# Patient Record
Sex: Female | Born: 1945 | Race: White | Hispanic: No | Smoking: Never smoker
Health system: Southern US, Community
[De-identification: ages and names within clinical notes are randomized; demographics above are authoritative.]

## PROBLEM LIST (undated history)

## (undated) DIAGNOSIS — Z9989 Dependence on other enabling machines and devices: Secondary | ICD-10-CM

## (undated) DIAGNOSIS — R112 Nausea with vomiting, unspecified: Secondary | ICD-10-CM

## (undated) DIAGNOSIS — M1990 Unspecified osteoarthritis, unspecified site: Secondary | ICD-10-CM

## (undated) DIAGNOSIS — F329 Major depressive disorder, single episode, unspecified: Secondary | ICD-10-CM

## (undated) DIAGNOSIS — F32A Depression, unspecified: Secondary | ICD-10-CM

## (undated) DIAGNOSIS — I10 Essential (primary) hypertension: Secondary | ICD-10-CM

## (undated) DIAGNOSIS — B029 Zoster without complications: Secondary | ICD-10-CM

## (undated) DIAGNOSIS — F419 Anxiety disorder, unspecified: Secondary | ICD-10-CM

## (undated) DIAGNOSIS — Z9221 Personal history of antineoplastic chemotherapy: Secondary | ICD-10-CM

## (undated) DIAGNOSIS — C801 Malignant (primary) neoplasm, unspecified: Secondary | ICD-10-CM

## (undated) DIAGNOSIS — Z923 Personal history of irradiation: Secondary | ICD-10-CM

## (undated) DIAGNOSIS — C50412 Malignant neoplasm of upper-outer quadrant of left female breast: Secondary | ICD-10-CM

## (undated) DIAGNOSIS — K219 Gastro-esophageal reflux disease without esophagitis: Secondary | ICD-10-CM

## (undated) DIAGNOSIS — J45909 Unspecified asthma, uncomplicated: Secondary | ICD-10-CM

## (undated) DIAGNOSIS — G4730 Sleep apnea, unspecified: Secondary | ICD-10-CM

## (undated) DIAGNOSIS — Z9889 Other specified postprocedural states: Secondary | ICD-10-CM

## (undated) DIAGNOSIS — C50919 Malignant neoplasm of unspecified site of unspecified female breast: Secondary | ICD-10-CM

## (undated) HISTORY — DX: Malignant neoplasm of upper-outer quadrant of left female breast: C50.412

## (undated) HISTORY — DX: Dependence on other enabling machines and devices: Z99.89

## (undated) HISTORY — DX: Zoster without complications: B02.9

## (undated) HISTORY — DX: Depression, unspecified: F32.A

## (undated) HISTORY — PX: DILATION AND CURETTAGE OF UTERUS: SHX78

## (undated) HISTORY — DX: Essential (primary) hypertension: I10

## (undated) HISTORY — PX: LAPAROTOMY: SHX154

## (undated) HISTORY — DX: Major depressive disorder, single episode, unspecified: F32.9

## (undated) HISTORY — DX: Malignant (primary) neoplasm, unspecified: C80.1

## (undated) HISTORY — PX: ANTERIOR AND POSTERIOR REPAIR: SHX1172

## (undated) HISTORY — PX: ABDOMINAL HYSTERECTOMY: SHX81

---

## 2002-06-01 DIAGNOSIS — Z923 Personal history of irradiation: Secondary | ICD-10-CM

## 2002-06-01 DIAGNOSIS — C50919 Malignant neoplasm of unspecified site of unspecified female breast: Secondary | ICD-10-CM

## 2002-06-01 DIAGNOSIS — C801 Malignant (primary) neoplasm, unspecified: Secondary | ICD-10-CM

## 2002-06-01 DIAGNOSIS — Z9221 Personal history of antineoplastic chemotherapy: Secondary | ICD-10-CM

## 2002-06-01 HISTORY — DX: Malignant (primary) neoplasm, unspecified: C80.1

## 2002-06-01 HISTORY — PX: BREAST LUMPECTOMY: SHX2

## 2002-06-01 HISTORY — PX: BREAST BIOPSY: SHX20

## 2002-06-01 HISTORY — DX: Personal history of antineoplastic chemotherapy: Z92.21

## 2002-06-01 HISTORY — DX: Personal history of irradiation: Z92.3

## 2002-06-01 HISTORY — DX: Malignant neoplasm of unspecified site of unspecified female breast: C50.919

## 2002-11-29 HISTORY — PX: BREAST EXCISIONAL BIOPSY: SUR124

## 2004-03-01 ENCOUNTER — Ambulatory Visit: Payer: Self-pay | Admitting: Oncology

## 2004-03-17 ENCOUNTER — Ambulatory Visit: Payer: Self-pay | Admitting: General Surgery

## 2004-06-26 ENCOUNTER — Ambulatory Visit: Payer: Self-pay | Admitting: Oncology

## 2004-07-10 ENCOUNTER — Ambulatory Visit: Payer: Self-pay | Admitting: Oncology

## 2004-09-04 ENCOUNTER — Ambulatory Visit: Payer: Self-pay | Admitting: Oncology

## 2006-02-17 ENCOUNTER — Ambulatory Visit: Payer: Self-pay | Admitting: Oncology

## 2006-03-01 ENCOUNTER — Ambulatory Visit: Payer: Self-pay | Admitting: Oncology

## 2006-04-01 ENCOUNTER — Ambulatory Visit: Payer: Self-pay | Admitting: Oncology

## 2006-09-30 ENCOUNTER — Ambulatory Visit: Payer: Self-pay | Admitting: Oncology

## 2006-10-20 ENCOUNTER — Ambulatory Visit: Payer: Self-pay | Admitting: Oncology

## 2006-10-31 ENCOUNTER — Ambulatory Visit: Payer: Self-pay | Admitting: Oncology

## 2006-11-30 ENCOUNTER — Ambulatory Visit: Payer: Self-pay | Admitting: Oncology

## 2007-04-02 ENCOUNTER — Ambulatory Visit: Payer: Self-pay | Admitting: Oncology

## 2007-04-18 ENCOUNTER — Ambulatory Visit: Payer: Self-pay | Admitting: Oncology

## 2007-05-02 ENCOUNTER — Ambulatory Visit: Payer: Self-pay | Admitting: Oncology

## 2007-10-31 ENCOUNTER — Ambulatory Visit: Payer: Self-pay | Admitting: Oncology

## 2007-11-09 ENCOUNTER — Ambulatory Visit: Payer: Self-pay | Admitting: Oncology

## 2008-04-01 ENCOUNTER — Ambulatory Visit: Payer: Self-pay | Admitting: Oncology

## 2008-04-04 ENCOUNTER — Ambulatory Visit: Payer: Self-pay | Admitting: Oncology

## 2008-05-01 ENCOUNTER — Ambulatory Visit: Payer: Self-pay | Admitting: Oncology

## 2008-11-28 ENCOUNTER — Emergency Department: Payer: Self-pay | Admitting: Emergency Medicine

## 2008-11-29 ENCOUNTER — Emergency Department: Payer: Self-pay | Admitting: Emergency Medicine

## 2009-04-01 LAB — HM MAMMOGRAPHY: HM Mammogram: NORMAL

## 2009-07-30 ENCOUNTER — Ambulatory Visit: Payer: Self-pay | Admitting: Oncology

## 2009-07-30 LAB — HM COLONOSCOPY: HM Colonoscopy: 5

## 2009-08-02 ENCOUNTER — Ambulatory Visit: Payer: Self-pay | Admitting: Oncology

## 2009-08-30 ENCOUNTER — Ambulatory Visit: Payer: Self-pay | Admitting: Oncology

## 2009-09-24 ENCOUNTER — Ambulatory Visit: Payer: Self-pay | Admitting: Unknown Physician Specialty

## 2009-11-18 ENCOUNTER — Ambulatory Visit: Payer: Self-pay | Admitting: Internal Medicine

## 2011-03-04 ENCOUNTER — Ambulatory Visit (INDEPENDENT_AMBULATORY_CARE_PROVIDER_SITE_OTHER): Payer: PRIVATE HEALTH INSURANCE | Admitting: Internal Medicine

## 2011-03-04 ENCOUNTER — Encounter: Payer: Self-pay | Admitting: Internal Medicine

## 2011-03-04 VITALS — BP 153/88 | HR 75 | Temp 98.6°F | Resp 16 | Ht 64.0 in | Wt 204.0 lb

## 2011-03-04 DIAGNOSIS — J329 Chronic sinusitis, unspecified: Secondary | ICD-10-CM

## 2011-03-04 DIAGNOSIS — F419 Anxiety disorder, unspecified: Secondary | ICD-10-CM | POA: Insufficient documentation

## 2011-03-04 DIAGNOSIS — F329 Major depressive disorder, single episode, unspecified: Secondary | ICD-10-CM | POA: Insufficient documentation

## 2011-03-04 DIAGNOSIS — F32A Depression, unspecified: Secondary | ICD-10-CM | POA: Insufficient documentation

## 2011-03-04 DIAGNOSIS — Z0000 Encounter for general adult medical examination without abnormal findings: Secondary | ICD-10-CM

## 2011-03-04 DIAGNOSIS — I10 Essential (primary) hypertension: Secondary | ICD-10-CM | POA: Insufficient documentation

## 2011-03-04 MED ORDER — AMOXICILLIN-POT CLAVULANATE 875-125 MG PO TABS: 1.0000 | ORAL_TABLET | Freq: Two times a day (BID) | ORAL | Status: AC

## 2011-03-04 MED ORDER — TRETINOIN 0.05 % EX CREA: 1.0000 "application " | TOPICAL_CREAM | Freq: Every day | CUTANEOUS | Status: DC

## 2011-03-04 NOTE — Patient Instructions (Signed)

## 2011-03-04 NOTE — Progress Notes (Signed)
Subjective:    Patient ID: Dana Obrien, female    DOB: 1946-01-03, 65 y.o.   MRN: [id number]  HPI Ms. Poch is a 65 year old female with a history of depression who presents for an acute visit complaining of left sinus pain and purulent nasal drainage. She reports that her symptoms started approximately one to 2 weeks ago. She has been trying over-the-counter medicines including Mucinex with no improvement. She has a history of recurrent sinus infections. She reports some subjective fever and chills at home. She reports intermittent cough productive of thick sputum. She has sick contacts with her work.  Ms. Ruddy also notes recent worsening of her depression and anxiety in the setting of a separation from her husband. She reports that her husband was lying to her about financial issues. She reports some anxiety about ongoing issues with the [employer]. She has not sought counseling for this.  Outpatient Encounter Prescriptions as of 03/04/2011  Medication Sig Dispense Refill  . tretinoin (RETIN-A) 0.05 % cream Apply 1 application topically at bedtime.  45 g  3  . albuterol (PROVENTIL HFA;VENTOLIN HFA) 108 (90 BASE) MCG/ACT inhaler Inhale 2 puffs into the lungs every 6 (six) hours as needed.        . ALPRAZolam (XANAX) 0.5 MG tablet Take 0.5 mg by mouth 3 (three) times daily as needed.        Marland Kitchen amoxicillin-clavulanate (AUGMENTIN) 875-125 MG per tablet Take 1 tablet by mouth 2 (two) times daily.  20 tablet  0  . FLUoxetine (PROZAC) 20 MG capsule Take 20 mg by mouth daily. Take 3 tablets daily       . fluticasone (FLONASE) 50 MCG/ACT nasal spray Place 2 sprays into the nose daily.        . hydrOXYzine (ATARAX/VISTARIL) 25 MG tablet Take 25 mg by mouth 2 (two) times daily.        Marland Kitchen losartan-hydrochlorothiazide (HYZAAR) 100-25 MG per tablet Take 1 tablet by mouth daily.        . meloxicam (MOBIC) 7.5 MG tablet Take 7.5 mg by mouth daily.          Review of Systems  Constitutional: Negative for  fever, chills, appetite change, fatigue and unexpected weight change.  HENT: Positive for congestion, sore throat, rhinorrhea, postnasal drip and sinus pressure. Negative for ear pain, trouble swallowing, neck pain and voice change.   Eyes: Negative for visual disturbance.  Respiratory: Positive for cough. Negative for shortness of breath, wheezing and stridor.   Cardiovascular: Negative for chest pain, palpitations and leg swelling.  Gastrointestinal: Negative for nausea, vomiting, abdominal pain, diarrhea, constipation, blood in stool, abdominal distention and anal bleeding.  Genitourinary: Negative for dysuria and flank pain.  Musculoskeletal: Negative for myalgias, arthralgias and gait problem.  Skin: Negative for color change and rash.  Neurological: Negative for dizziness and headaches.  Hematological: Negative for adenopathy. Does not bruise/bleed easily.  Psychiatric/Behavioral: Positive for dysphoric mood. Negative for suicidal ideas and sleep disturbance. The patient is nervous/anxious.    BP 153/88  Pulse 75  Temp(Src) 98.6 F (37 C) (Oral)  Resp 16  Ht 5\' 4"  (1.626 m)  Wt 204 lb (92.534 kg)  BMI 35.02 kg/m2  SpO2 97%     Objective:   Physical Exam  Constitutional: She is oriented to person, place, and time. She appears well-developed and well-nourished. No distress.  HENT:  Head: Normocephalic and atraumatic.    Right Ear: External ear and ear canal normal. A middle ear effusion is present.  Left Ear: External ear and ear canal normal. A middle ear effusion is present.  Nose: Right sinus exhibits maxillary sinus tenderness. Right sinus exhibits no frontal sinus tenderness. Left sinus exhibits maxillary sinus tenderness. Left sinus exhibits no frontal sinus tenderness.  Mouth/Throat: Oropharynx is clear and moist. No oropharyngeal exudate.  Eyes: Conjunctivae are normal. Pupils are equal, round, and reactive to light. Right eye exhibits no discharge. Left eye exhibits no  discharge. No scleral icterus.  Neck: Normal range of motion. Neck supple. No tracheal deviation present. No thyromegaly present.  Cardiovascular: Normal rate, regular rhythm, normal heart sounds and intact distal pulses.  Exam reveals no gallop and no friction rub.   No murmur heard. Pulmonary/Chest: Effort normal and breath sounds normal. No respiratory distress. She has no wheezes. She has no rales. She exhibits no tenderness.  Musculoskeletal: Normal range of motion. She exhibits no edema and no tenderness.  Lymphadenopathy:    She has no cervical adenopathy.  Neurological: She is alert and oriented to person, place, and time. No cranial nerve deficit. She exhibits normal muscle tone. Coordination normal.  Skin: Skin is warm and dry. No rash noted. She is not diaphoretic. No erythema. No pallor.  Psychiatric: Her speech is normal and behavior is normal. Judgment and thought content normal. Cognition and memory are normal. She exhibits a depressed mood.          Assessment & Plan:  1. Sinusitis - Left maxillary. Will treat with augmentin x10 days. Will continue ibuprofen and mucinex. She will call or return to clinic if symptoms do not improve. In the past she has required prednisone, and we discussed a course of this if no improvement.  2. Depression - worsening after separation from spouse. Offered support today. Discussed counseling. She would like to hold off for now. She will continue her Prozac. We discussed adding additional medicine if symptoms persist. She will followup in one month.

## 2011-04-06 ENCOUNTER — Other Ambulatory Visit (INDEPENDENT_AMBULATORY_CARE_PROVIDER_SITE_OTHER): Payer: PRIVATE HEALTH INSURANCE | Admitting: *Deleted

## 2011-04-06 DIAGNOSIS — Z0000 Encounter for general adult medical examination without abnormal findings: Secondary | ICD-10-CM

## 2011-04-06 LAB — CBC WITH DIFFERENTIAL/PLATELET
Eosinophils Relative: 4.9 % (ref 0.0–5.0)
HCT: 35.8 % — ABNORMAL LOW (ref 36.0–46.0)
Hemoglobin: 12.3 g/dL (ref 12.0–15.0)
Lymphocytes Relative: 27.3 % (ref 12.0–46.0)
Lymphs Abs: 1.9 10*3/uL (ref 0.7–4.0)
Monocytes Relative: 7.6 % (ref 3.0–12.0)
Neutro Abs: 4.1 10*3/uL (ref 1.4–7.7)
Platelets: 235 10*3/uL (ref 150.0–400.0)
WBC: 6.9 10*3/uL (ref 4.5–10.5)

## 2011-04-06 LAB — COMPREHENSIVE METABOLIC PANEL
ALT: 21 U/L (ref 0–35)
Albumin: 3.9 g/dL (ref 3.5–5.2)
CO2: 26 mEq/L (ref 19–32)
Calcium: 9.1 mg/dL (ref 8.4–10.5)
Chloride: 103 mEq/L (ref 96–112)
GFR: 80.31 mL/min (ref >60.00)
Glucose, Bld: 96 mg/dL (ref 70–99)
Sodium: 139 mEq/L (ref 135–145)
Total Bilirubin: 0.4 mg/dL (ref 0.3–1.2)
Total Protein: 7.1 g/dL (ref 6.0–8.3)

## 2011-04-06 LAB — LIPID PANEL
Cholesterol: 283 mg/dL — ABNORMAL HIGH (ref 0–200)
VLDL: 32.8 mg/dL (ref 0.0–40.0)

## 2011-04-06 LAB — LDL CHOLESTEROL, DIRECT: Direct LDL: 212.7 mg/dL

## 2011-04-07 ENCOUNTER — Telehealth: Payer: Self-pay | Admitting: *Deleted

## 2011-04-07 MED ORDER — ATORVASTATIN CALCIUM 20 MG PO TABS: 20.0000 mg | ORAL_TABLET | Freq: Every day | ORAL | Status: DC

## 2011-04-07 MED ORDER — ALPRAZOLAM 0.5 MG PO TABS: 0.5000 mg | ORAL_TABLET | Freq: Three times a day (TID) | ORAL | Status: DC | PRN

## 2011-04-07 NOTE — Telephone Encounter (Signed)
OK to refill #90

## 2011-04-07 NOTE — Telephone Encounter (Signed)
Called in.

## 2011-04-07 NOTE — Telephone Encounter (Signed)
Pt informed of results & RX. She is req Rf of Xanax. She has OV this week but will be out of med before apt.

## 2011-04-07 NOTE — Telephone Encounter (Signed)
Message copied by Vernie Murders on Tue Apr 07, 2011 11:24 AM ------      Message from: Ronna Polio A      Created: Mon Apr 06, 2011  5:17 PM       Labs show that cholesterol is elevated. I would like to start Lipitor 20mg  daily.  Recheck lipids and LFTs in 3 months.

## 2011-04-09 ENCOUNTER — Encounter: Payer: Self-pay | Admitting: Internal Medicine

## 2011-04-09 ENCOUNTER — Other Ambulatory Visit (HOSPITAL_COMMUNITY)
Admission: RE | Admit: 2011-04-09 | Discharge: 2011-04-09 | Disposition: A | Discharge status: Home / Self Care | Payer: PRIVATE HEALTH INSURANCE | Source: Ambulatory Visit | Attending: Internal Medicine | Admitting: Internal Medicine

## 2011-04-09 ENCOUNTER — Ambulatory Visit (INDEPENDENT_AMBULATORY_CARE_PROVIDER_SITE_OTHER): Payer: PRIVATE HEALTH INSURANCE | Admitting: Internal Medicine

## 2011-04-09 DIAGNOSIS — N816 Rectocele: Secondary | ICD-10-CM

## 2011-04-09 DIAGNOSIS — E785 Hyperlipidemia, unspecified: Secondary | ICD-10-CM

## 2011-04-09 DIAGNOSIS — F32A Depression, unspecified: Secondary | ICD-10-CM

## 2011-04-09 DIAGNOSIS — Z01419 Encounter for gynecological examination (general) (routine) without abnormal findings: Secondary | ICD-10-CM | POA: Insufficient documentation

## 2011-04-09 DIAGNOSIS — Z0000 Encounter for general adult medical examination without abnormal findings: Secondary | ICD-10-CM

## 2011-04-09 DIAGNOSIS — F329 Major depressive disorder, single episode, unspecified: Secondary | ICD-10-CM

## 2011-04-09 DIAGNOSIS — Z1159 Encounter for screening for other viral diseases: Secondary | ICD-10-CM | POA: Insufficient documentation

## 2011-04-09 MED ORDER — BUPROPION HCL ER (XL) 150 MG PO TB24: 150.0000 mg | ORAL_TABLET | ORAL | Status: DC

## 2011-04-09 NOTE — Patient Instructions (Signed)
Labs in 1 month. Follow up in 1 month. We will set up referral to urogynecology. Call if any concerns.

## 2011-04-09 NOTE — Progress Notes (Signed)
Subjective:    Patient ID: Dana Obrien, female    DOB: 02-08-1946, 65 y.o.   MRN: [id number]  HPI  65 year old female with a history of hypertension presents for her annual exam. She reports that recently her symptoms of depression have worsened. She recently separated from her husband of over 40 years. She is moving into a new apartment. She reports that this has been a difficult time for her. She does not feel that the Prozac is adequately controlling her symptoms. She is interested in adding another medication. She notes that she has a strong support network with her family. She feels that she is moving in the right direction. She denies any suicidal ideation.  We reviewed her lab work from last week. Notably, her cholesterol was elevated with an LDL greater than 200. We discussed the risk of heart disease and atherosclerosis at this level. We discussed starting Lipitor. She is willing to start this medication. We also discussed healthy diet and exercise including limiting intake of saturated fat and increasing intake of fiber as well as moderate exercise 30-60 minutes most days of the week. She had previously been participating in a walking program but had recently stopped with increased work responsibilities and with the separation from her husband.  She notes a history of cystocele and rectocele. She is interested in referral to urogynecology for this. She denies any problems with her bowel or bladder at present.  Outpatient Encounter Prescriptions as of 04/09/2011  Medication Sig Dispense Refill  . albuterol (PROVENTIL HFA;VENTOLIN HFA) 108 (90 BASE) MCG/ACT inhaler Inhale 2 puffs into the lungs every 6 (six) hours as needed.        . ALPRAZolam (XANAX) 0.5 MG tablet Take 1 tablet (0.5 mg total) by mouth 3 (three) times daily as needed.  90 tablet  0  . FLUoxetine (PROZAC) 20 MG capsule Take 60 mg by mouth daily.       . fluticasone (FLONASE) 50 MCG/ACT nasal spray Place 2 sprays into the  nose daily.        . hydrOXYzine (ATARAX/VISTARIL) 25 MG tablet Take 25 mg by mouth 2 (two) times daily as needed.       Marland Kitchen losartan-hydrochlorothiazide (HYZAAR) 100-25 MG per tablet Take 1 tablet by mouth daily.        . meloxicam (MOBIC) 7.5 MG tablet Take 7.5 mg by mouth daily.        Marland Kitchen tretinoin (RETIN-A) 0.05 % cream Apply 1 application topically at bedtime.  45 g  3  . atorvastatin (LIPITOR) 20 MG tablet Take 1 tablet (20 mg total) by mouth daily.  90 tablet  1     Review of Systems  Constitutional: Negative for fever, chills, appetite change, fatigue and unexpected weight change.  HENT: Negative for ear pain, congestion, sore throat, trouble swallowing, neck pain, voice change and sinus pressure.   Eyes: Negative for visual disturbance.  Respiratory: Negative for cough, shortness of breath, wheezing and stridor.   Cardiovascular: Negative for chest pain, palpitations and leg swelling.  Gastrointestinal: Negative for nausea, vomiting, abdominal pain, diarrhea, constipation, blood in stool, abdominal distention and anal bleeding.  Genitourinary: Negative for dysuria and flank pain.  Musculoskeletal: Negative for myalgias, arthralgias and gait problem.  Skin: Negative for color change and rash.  Neurological: Negative for dizziness and headaches.  Hematological: Negative for adenopathy. Does not bruise/bleed easily.  Psychiatric/Behavioral: Positive for dysphoric mood. Negative for suicidal ideas and sleep disturbance. The patient is not nervous/anxious.  BP 142/86  Pulse 72  Temp(Src) 98.2 F (36.8 C) (Oral)  Ht 5\' 4"  (1.626 m)  Wt 221 lb (100.245 kg)  BMI 37.93 kg/m2  SpO2 97%     Objective:   Physical Exam  Constitutional: She is oriented to person, place, and time. She appears well-developed and well-nourished. No distress.  HENT:  Head: Normocephalic and atraumatic.  Right Ear: External ear normal.  Left Ear: External ear normal.  Nose: Nose normal.  Mouth/Throat:  Oropharynx is clear and moist. No oropharyngeal exudate.  Eyes: Conjunctivae are normal. Pupils are equal, round, and reactive to light. Right eye exhibits no discharge. Left eye exhibits no discharge. No scleral icterus.  Neck: Normal range of motion. Neck supple. No tracheal deviation present. No thyromegaly present.  Cardiovascular: Normal rate, regular rhythm, normal heart sounds and intact distal pulses.  Exam reveals no gallop and no friction rub.   No murmur heard. Pulmonary/Chest: Effort normal and breath sounds normal. No respiratory distress. She has no wheezes. She has no rales. She exhibits no tenderness. Right breast exhibits no inverted nipple, no mass, no nipple discharge, no skin change and no tenderness. Left breast exhibits no inverted nipple, no mass, no nipple discharge, no skin change and no tenderness.    Abdominal: Soft. Bowel sounds are normal. She exhibits no distension and no mass. There is no tenderness. There is no rebound and no guarding.  Genitourinary: Rectum normal, vagina normal and uterus normal. No breast swelling, tenderness, discharge or bleeding. Pelvic exam was performed with patient prone. There is no rash, tenderness or lesion on the right labia. There is no rash, tenderness or lesion on the left labia. Uterus is not enlarged and not tender. Cervix exhibits no motion tenderness, no discharge and no friability. Right adnexum displays no mass, no tenderness and no fullness. Left adnexum displays no mass, no tenderness and no fullness. No erythema or tenderness around the vagina. No vaginal discharge found.       Cystocele noted  Musculoskeletal: Normal range of motion. She exhibits no edema and no tenderness.  Lymphadenopathy:    She has no cervical adenopathy.  Neurological: She is alert and oriented to person, place, and time. No cranial nerve deficit. She exhibits normal muscle tone. Coordination normal.  Skin: Skin is warm and dry. No rash noted. She is not  diaphoretic. No erythema. No pallor.  Psychiatric: She has a normal mood and affect. Her behavior is normal. Judgment and thought content normal.          Assessment & Plan:  1. General exam - Exam including breast exam and pelvic normal.  PAP is pending.  Health maintenance is up to date. Labs reviewed today.   2. Depression - Worsening. Will add bupropion to see if any improvement. Follow up one month.  3. Hyperlipidemia - Noted on screening labs. LDL>200.  Discussed goal of LDL<100. Will start Lipitor 20mg  daily. Repeat lipids and LFTs in 1 month. Encouraged diet low in saturated fat, high in fiber and regular exercise such as walking most days of week.  4. Rectocele/Cystocele - Will set up referral to Urogynecology.

## 2011-04-13 ENCOUNTER — Encounter: Payer: Self-pay | Admitting: Internal Medicine

## 2011-04-15 ENCOUNTER — Telehealth: Payer: Self-pay | Admitting: Internal Medicine

## 2011-04-15 NOTE — Telephone Encounter (Signed)
Notified patient of results 

## 2011-04-15 NOTE — Telephone Encounter (Signed)
Patient called asking about her PAP results.  Please advise

## 2011-04-15 NOTE — Telephone Encounter (Signed)
PAP was normal and HPV was negative. I think a letter was sent, but may not have arrived yet.

## 2011-05-04 ENCOUNTER — Other Ambulatory Visit (INDEPENDENT_AMBULATORY_CARE_PROVIDER_SITE_OTHER): Payer: PRIVATE HEALTH INSURANCE | Admitting: *Deleted

## 2011-05-04 DIAGNOSIS — Z0000 Encounter for general adult medical examination without abnormal findings: Secondary | ICD-10-CM

## 2011-05-04 DIAGNOSIS — E785 Hyperlipidemia, unspecified: Secondary | ICD-10-CM

## 2011-05-04 LAB — CBC WITH DIFFERENTIAL/PLATELET
Basophils Absolute: 0.1 10*3/uL (ref 0.0–0.1)
Basophils Relative: 0.6 % (ref 0.0–3.0)
Eosinophils Absolute: 0.3 10*3/uL (ref 0.0–0.7)
Eosinophils Relative: 3 % (ref 0.0–5.0)
HCT: 37.4 % (ref 36.0–46.0)
Hemoglobin: 12.8 g/dL (ref 12.0–15.0)
Lymphocytes Relative: 23.3 % (ref 12.0–46.0)
Lymphs Abs: 2 10*3/uL (ref 0.7–4.0)
MCHC: 34.2 g/dL (ref 30.0–36.0)
MCV: 95.4 fl (ref 78.0–100.0)
Monocytes Absolute: 0.7 10*3/uL (ref 0.1–1.0)
Monocytes Relative: 8.2 % (ref 3.0–12.0)
Neutro Abs: 5.6 10*3/uL (ref 1.4–7.7)
Neutrophils Relative %: 64.9 % (ref 43.0–77.0)
Platelets: 227 10*3/uL (ref 150.0–400.0)
RBC: 3.92 Mil/uL (ref 3.87–5.11)
RDW: 13.5 % (ref 11.5–14.6)
WBC: 8.7 10*3/uL (ref 4.5–10.5)

## 2011-05-04 LAB — COMPREHENSIVE METABOLIC PANEL
ALT: 20 U/L (ref 0–35)
AST: 21 U/L (ref 0–37)
Albumin: 4 g/dL (ref 3.5–5.2)
Alkaline Phosphatase: 75 U/L (ref 39–117)
BUN: 22 mg/dL (ref 6–23)
CO2: 27 mEq/L (ref 19–32)
Calcium: 9.2 mg/dL (ref 8.4–10.5)
Chloride: 102 mEq/L (ref 96–112)
Creatinine, Ser: 1.3 mg/dL — ABNORMAL HIGH (ref 0.4–1.2)
GFR: 42.36 mL/min — ABNORMAL LOW (ref >60.00)
Glucose, Bld: 96 mg/dL (ref 70–99)
Potassium: 3.8 mEq/L (ref 3.5–5.1)
Sodium: 139 mEq/L (ref 135–145)
Total Bilirubin: 0.6 mg/dL (ref 0.3–1.2)
Total Protein: 7.4 g/dL (ref 6.0–8.3)

## 2011-05-04 LAB — LIPID PANEL
Cholesterol: 181 mg/dL (ref 0–200)
HDL: 55.4 mg/dL (ref >39.00)
LDL Cholesterol: 94 mg/dL (ref 0–99)
Total CHOL/HDL Ratio: 3
Triglycerides: 156 mg/dL — ABNORMAL HIGH (ref 0.0–149.0)
VLDL: 31.2 mg/dL (ref 0.0–40.0)

## 2011-05-06 ENCOUNTER — Telehealth: Payer: Self-pay | Admitting: Internal Medicine

## 2011-05-06 ENCOUNTER — Ambulatory Visit: Payer: Self-pay | Admitting: Internal Medicine

## 2011-05-06 MED ORDER — FLUOXETINE HCL 20 MG PO CAPS: 60.0000 mg | ORAL_CAPSULE | Freq: Every day | ORAL | Status: DC

## 2011-05-06 NOTE — Telephone Encounter (Signed)
Please fax to [phone] attention Nettie Elm.

## 2011-05-06 NOTE — Telephone Encounter (Signed)
I have faxed order

## 2011-05-06 NOTE — Telephone Encounter (Signed)
Pt Bone Density showed some early bone loss in forearm.  We can discuss at her upcoming visit.

## 2011-05-07 NOTE — Telephone Encounter (Signed)
Patient informed. 

## 2011-05-07 NOTE — Telephone Encounter (Signed)
Pt not avail, left VM w/daughter to call office back

## 2011-05-08 ENCOUNTER — Other Ambulatory Visit: Payer: Self-pay | Admitting: *Deleted

## 2011-05-08 MED ORDER — FLUOXETINE HCL 20 MG PO CAPS: 60.0000 mg | ORAL_CAPSULE | Freq: Every day | ORAL | Status: DC

## 2011-05-11 ENCOUNTER — Ambulatory Visit (INDEPENDENT_AMBULATORY_CARE_PROVIDER_SITE_OTHER): Payer: PRIVATE HEALTH INSURANCE | Admitting: Internal Medicine

## 2011-05-11 ENCOUNTER — Encounter: Payer: Self-pay | Admitting: Internal Medicine

## 2011-05-11 VITALS — BP 130/80 | HR 76 | Temp 98.5°F | Wt 198.0 lb

## 2011-05-11 DIAGNOSIS — E785 Hyperlipidemia, unspecified: Secondary | ICD-10-CM | POA: Insufficient documentation

## 2011-05-11 DIAGNOSIS — N289 Disorder of kidney and ureter, unspecified: Secondary | ICD-10-CM

## 2011-05-11 DIAGNOSIS — F32A Depression, unspecified: Secondary | ICD-10-CM

## 2011-05-11 DIAGNOSIS — F329 Major depressive disorder, single episode, unspecified: Secondary | ICD-10-CM

## 2011-05-11 DIAGNOSIS — M899 Disorder of bone, unspecified: Secondary | ICD-10-CM

## 2011-05-11 DIAGNOSIS — M8580 Other specified disorders of bone density and structure, unspecified site: Secondary | ICD-10-CM | POA: Insufficient documentation

## 2011-05-11 DIAGNOSIS — R110 Nausea: Secondary | ICD-10-CM

## 2011-05-11 DIAGNOSIS — M949 Disorder of cartilage, unspecified: Secondary | ICD-10-CM

## 2011-05-11 LAB — BASIC METABOLIC PANEL
BUN: 17 mg/dL (ref 6–23)
CO2: 27 mEq/L (ref 19–32)
Chloride: 102 mEq/L (ref 96–112)
GFR: 66.21 mL/min (ref >60.00)
Glucose, Bld: 121 mg/dL — ABNORMAL HIGH (ref 70–99)
Potassium: 4 mEq/L (ref 3.5–5.1)

## 2011-05-11 MED ORDER — ONDANSETRON HCL 8 MG PO TABS: 8.0000 mg | ORAL_TABLET | Freq: Three times a day (TID) | ORAL | Status: AC | PRN

## 2011-05-11 NOTE — Progress Notes (Signed)
Subjective:    Patient ID: Dana Obrien, female    DOB: 1945/10/24, 65 y.o.   MRN: [id number]  HPI 65 year old female with history of hyperlipidemia, depression, and hypertension presents for followup. In regards to her depression, she reports significant improvement in her mood with the use of Wellbutrin. She has also noted an improvement in her energy level. She denies any side effects with Wellbutrin. She continues to take both Wellbutrin and Prozac.  In regards to her hyperlipidemia, she reports full compliance with Lipitor. She has been eating a healthier diet, low in saturated fat. She denies any noted myalgias with the use of Lipitor. We reviewed her recent lab work today which show a drop in her LDL of over 100 points.  She notes that over the last 3 days she has had some nausea. She reports that she has had numerous sick contacts with patients with viral gastroenteritis. She has not yet had any vomiting. She denies any fever. She did not have any abdominal pain. She denies any diarrhea. She has not taking any medication for this.  Today, we also reviewed the reports on her recent bone density test which showed osteopenia in her forearm with a T score of -1.2.  Outpatient Encounter Prescriptions as of 05/11/2011  Medication Sig Dispense Refill  . albuterol (PROVENTIL HFA;VENTOLIN HFA) 108 (90 BASE) MCG/ACT inhaler Inhale 2 puffs into the lungs every 6 (six) hours as needed.        . ALPRAZolam (XANAX) 0.5 MG tablet Take 1 tablet (0.5 mg total) by mouth 3 (three) times daily as needed.  90 tablet  0  . atorvastatin (LIPITOR) 20 MG tablet Take 1 tablet (20 mg total) by mouth daily.  90 tablet  1  . buPROPion (WELLBUTRIN XL) 150 MG 24 hr tablet Take 1 tablet (150 mg total) by mouth every morning.  30 tablet  2  . FLUoxetine (PROZAC) 20 MG capsule Take 3 capsules (60 mg total) by mouth daily.  90 capsule  6  . fluticasone (FLONASE) 50 MCG/ACT nasal spray Place 2 sprays into the nose  daily.        . hydrOXYzine (ATARAX/VISTARIL) 25 MG tablet Take 25 mg by mouth 2 (two) times daily as needed.       Marland Kitchen losartan-hydrochlorothiazide (HYZAAR) 100-25 MG per tablet Take 1 tablet by mouth daily.        . meloxicam (MOBIC) 7.5 MG tablet Take 7.5 mg by mouth daily.        Marland Kitchen tretinoin (RETIN-A) 0.05 % cream Apply 1 application topically at bedtime.  45 g  3  . ondansetron (ZOFRAN) 8 MG tablet Take 1 tablet (8 mg total) by mouth every 8 (eight) hours as needed for nausea.  20 tablet  0    Review of Systems  Constitutional: Negative for fever, chills, appetite change, fatigue and unexpected weight change.  HENT: Positive for voice change.   Eyes: Negative for visual disturbance.  Respiratory: Negative for cough, shortness of breath, wheezing and stridor.   Cardiovascular: Negative for chest pain, palpitations and leg swelling.  Gastrointestinal: Positive for nausea. Negative for vomiting, abdominal pain, diarrhea, constipation, blood in stool, abdominal distention and anal bleeding.  Genitourinary: Negative for dysuria and flank pain.  Musculoskeletal: Negative for myalgias, arthralgias and gait problem.  Skin: Negative for color change and rash.  Neurological: Negative for dizziness and headaches.  Hematological: Negative for adenopathy. Does not bruise/bleed easily.  Psychiatric/Behavioral: Negative for suicidal ideas, sleep disturbance and dysphoric mood.  The patient is not nervous/anxious.    BP 130/80  Pulse 76  Temp(Src) 98.5 F (36.9 C) (Oral)  Wt 198 lb (89.812 kg)  SpO2 95%     Objective:   Physical Exam  Constitutional: She is oriented to person, place, and time. She appears well-developed and well-nourished. No distress.  HENT:  Head: Normocephalic and atraumatic.  Right Ear: External ear normal.  Left Ear: External ear normal.  Nose: Nose normal.  Mouth/Throat: Oropharynx is clear and moist. No oropharyngeal exudate.  Eyes: Conjunctivae are normal. Pupils are  equal, round, and reactive to light. Right eye exhibits no discharge. Left eye exhibits no discharge. No scleral icterus.  Neck: Normal range of motion. Neck supple. No tracheal deviation present. No thyromegaly present.  Cardiovascular: Normal rate, regular rhythm, normal heart sounds and intact distal pulses.  Exam reveals no gallop and no friction rub.   No murmur heard. Pulmonary/Chest: Effort normal and breath sounds normal. No respiratory distress. She has no wheezes. She has no rales. She exhibits no tenderness.  Abdominal: Soft. Bowel sounds are normal. She exhibits no distension and no mass. There is no tenderness. There is no rebound and no guarding.  Musculoskeletal: Normal range of motion. She exhibits no edema and no tenderness.  Lymphadenopathy:    She has no cervical adenopathy.  Neurological: She is alert and oriented to person, place, and time. No cranial nerve deficit. She exhibits normal muscle tone. Coordination normal.  Skin: Skin is warm and dry. No rash noted. She is not diaphoretic. No erythema. No pallor.  Psychiatric: She has a normal mood and affect. Her behavior is normal. Judgment and thought content normal.          Assessment & Plan:  1. Depression - Improved with wellbutrin. Will continue. Follow up in 3 months.  2. Nausea - Suspect viral syndrome, given recent exposures. Will give Rx for ondansetron to use as needed. Follow up if symptoms persistent or if pt unable to tolerate liquids.  3. Hyperlipidemia - Amazing improvement on lipitor and with improved diet with reduction in LDL by over 100pts. Will continue lipitor. Follow up in 3 months. Repeat lipids in 6 months, 09/2011.  4. Osteopenia - Will check Vit D with labs. Pt will continue Vit D and calcium supplementation. Encouraged weight bearing exercise. Discussed option of bisphosphonate, but would prefer to hold off for now. Repeat bone density in 2 years.

## 2011-05-22 ENCOUNTER — Encounter: Payer: Self-pay | Admitting: Internal Medicine

## 2011-05-27 ENCOUNTER — Encounter: Payer: Self-pay | Admitting: Internal Medicine

## 2011-06-17 ENCOUNTER — Telehealth: Payer: Self-pay | Admitting: *Deleted

## 2011-06-17 ENCOUNTER — Other Ambulatory Visit: Payer: Self-pay | Admitting: *Deleted

## 2011-06-17 MED ORDER — FLUTICASONE PROPIONATE 50 MCG/ACT NA SUSP: 2.0000 | Freq: Every day | NASAL | Status: DC

## 2011-06-17 NOTE — Telephone Encounter (Signed)
Pharm faxed RF request for alprazolam 0.5 mg tid prn. OK?

## 2011-06-18 MED ORDER — ALPRAZOLAM 0.5 MG PO TABS: 0.5000 mg | ORAL_TABLET | Freq: Three times a day (TID) | ORAL | Status: DC | PRN

## 2011-06-18 NOTE — Telephone Encounter (Signed)
Called in.

## 2011-06-18 NOTE — Telephone Encounter (Signed)
ok 

## 2011-06-29 ENCOUNTER — Encounter: Payer: Self-pay | Admitting: Internal Medicine

## 2011-06-30 ENCOUNTER — Encounter: Payer: Self-pay | Admitting: Internal Medicine

## 2011-07-03 ENCOUNTER — Other Ambulatory Visit: Payer: Self-pay | Admitting: *Deleted

## 2011-07-03 MED ORDER — LOSARTAN POTASSIUM-HCTZ 100-25 MG PO TABS: 1.0000 | ORAL_TABLET | Freq: Every day | ORAL | Status: DC

## 2011-07-15 ENCOUNTER — Encounter: Payer: Self-pay | Admitting: Internal Medicine

## 2011-07-17 ENCOUNTER — Telehealth: Payer: Self-pay | Admitting: Internal Medicine

## 2011-07-17 MED ORDER — OSELTAMIVIR PHOSPHATE 75 MG PO CAPS: 75.0000 mg | ORAL_CAPSULE | Freq: Two times a day (BID) | ORAL | Status: AC

## 2011-07-17 NOTE — Telephone Encounter (Signed)
Tamiflu 75mg  po bid x 5 days.  Should be seen if symptoms not improving. Let her know that medication can cause some nausea, so should take with food.

## 2011-07-17 NOTE — Telephone Encounter (Signed)
Patient says that she has had chills, body aches, feels nauseated, has fever of 101. This all started this morning. 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 She is asking if she can get tamiflu called in and also a rx for phenergan. Also she says that last time you offered to increase her wellbutrin, but she didn't want to at that time, but she has decided she would like to increase her wellbutrin.

## 2011-07-17 NOTE — Telephone Encounter (Signed)
Pt aware. Rx sent to [facility] per pt's request.  Pt states she is already having nausea and would like some phenergan.  Pls advise.

## 2011-07-17 NOTE — Telephone Encounter (Signed)
Fine to also increase wellbutrin to 300mg  daily. Will need follow up in 1 month.

## 2011-07-17 NOTE — Telephone Encounter (Signed)
OK. Phenergan 12.5mg  po q8hr prn nausea, disp 30 tabs. No refill

## 2011-07-20 MED ORDER — PROMETHAZINE HCL 12.5 MG PO TABS: 12.5000 mg | ORAL_TABLET | Freq: Three times a day (TID) | ORAL | Status: DC | PRN

## 2011-07-20 NOTE — Telephone Encounter (Signed)
Rx sent to pharmacy   

## 2011-08-06 ENCOUNTER — Other Ambulatory Visit: Payer: Self-pay | Admitting: Internal Medicine

## 2011-08-10 ENCOUNTER — Ambulatory Visit: Payer: PRIVATE HEALTH INSURANCE | Admitting: Internal Medicine

## 2011-08-10 DIAGNOSIS — Z0289 Encounter for other administrative examinations: Secondary | ICD-10-CM

## 2011-08-11 ENCOUNTER — Telehealth: Payer: Self-pay | Admitting: *Deleted

## 2011-08-11 MED ORDER — PROMETHAZINE HCL 12.5 MG PO TABS: 12.5000 mg | ORAL_TABLET | Freq: Three times a day (TID) | ORAL | Status: DC | PRN

## 2011-08-11 MED ORDER — BUPROPION HCL ER (XL) 300 MG PO TB24: 300.0000 mg | ORAL_TABLET | Freq: Every day | ORAL | Status: DC

## 2011-08-11 NOTE — Telephone Encounter (Signed)
Pt c/o h/a, diarrhea, vomiting, nausea and temp (100.3). Symptoms started yesterday, today pt c/o some loose stools, h/a and nausea. Advised pt to continue clear liquids and bland diet, take imodium prn diarrhea/loose stools. Also sent in RX, ok per MD, for phenergan. She will call office w/no improvement in symptoms.   - Pt requested on 2/15 increase in welbutrin. OK per MD but does not look like it was updated and sent in. I sent new RX in today and advised pt to f/u in 1 mth.

## 2011-08-18 ENCOUNTER — Other Ambulatory Visit: Payer: Self-pay | Admitting: *Deleted

## 2011-08-18 MED ORDER — MELOXICAM 7.5 MG PO TABS: 7.5000 mg | ORAL_TABLET | Freq: Every day | ORAL | Status: DC

## 2011-09-07 ENCOUNTER — Encounter: Payer: Self-pay | Admitting: Internal Medicine

## 2011-09-07 ENCOUNTER — Ambulatory Visit (INDEPENDENT_AMBULATORY_CARE_PROVIDER_SITE_OTHER): Payer: PRIVATE HEALTH INSURANCE | Admitting: Internal Medicine

## 2011-09-07 VITALS — BP 118/79 | HR 79 | Temp 98.5°F | Ht 64.0 in | Wt 192.8 lb

## 2011-09-07 DIAGNOSIS — E785 Hyperlipidemia, unspecified: Secondary | ICD-10-CM

## 2011-09-07 DIAGNOSIS — F32A Depression, unspecified: Secondary | ICD-10-CM

## 2011-09-07 DIAGNOSIS — I10 Essential (primary) hypertension: Secondary | ICD-10-CM

## 2011-09-07 DIAGNOSIS — F329 Major depressive disorder, single episode, unspecified: Secondary | ICD-10-CM

## 2011-09-07 LAB — COMPREHENSIVE METABOLIC PANEL
AST: 22 U/L (ref 0–37)
Albumin: 4.2 g/dL (ref 3.5–5.2)
Alkaline Phosphatase: 72 U/L (ref 39–117)
BUN: 17 mg/dL (ref 6–23)
Creatinine, Ser: 0.8 mg/dL (ref 0.4–1.2)
Glucose, Bld: 87 mg/dL (ref 70–99)
Total Bilirubin: 0.4 mg/dL (ref 0.3–1.2)

## 2011-09-07 LAB — LDL CHOLESTEROL, DIRECT: Direct LDL: 125.6 mg/dL

## 2011-09-07 LAB — LIPID PANEL
Cholesterol: 205 mg/dL — ABNORMAL HIGH (ref 0–200)
HDL: 54.7 mg/dL (ref >39.00)
Total CHOL/HDL Ratio: 4
Triglycerides: 169 mg/dL — ABNORMAL HIGH (ref 0.0–149.0)

## 2011-09-07 NOTE — Assessment & Plan Note (Signed)
Symptoms persistent with ongoing marital issues. Will continue current medications. Encouraged pt to follow up with psychologist and offered to make referral to new psychologist. Follow up in 3 months and prn.

## 2011-09-07 NOTE — Assessment & Plan Note (Signed)
Blood pressure well-controlled. Will continue losartan hydrochlorothiazide. We'll check renal function with labs today. Followup 3 months.

## 2011-09-07 NOTE — Assessment & Plan Note (Signed)
Will recheck lipids and LFTs with labs today. 

## 2011-09-07 NOTE — Progress Notes (Signed)
Subjective:    Patient ID: Dana Obrien, female    DOB: February 22, 1946, 66 y.o.   MRN: [id number]  HPI 66 year old female with history of hyperlipidemia, hypertension, and depression presents for followup. In regards to her depression, she reports reasonable control of symptoms with the use of Prozac and Wellbutrin, however has some ongoing anxiety and depressed mood related to marital issues. She is in the process of separating from her husband and they're having significant financial strain. She has sought counseling for this in the past but is not currently following with a counselor. She reports good support from her family and friends.  In regards to her hyperlipidemia and hypertension, she reports full compliance with her medications. She denies any noted side effects such as myalgia. She denies any chest pain, palpitations, or shortness of breath.  Outpatient Encounter Prescriptions as of 09/07/2011  Medication Sig Dispense Refill  . albuterol (PROVENTIL HFA;VENTOLIN HFA) 108 (90 BASE) MCG/ACT inhaler Inhale 2 puffs into the lungs every 6 (six) hours as needed.        . ALPRAZolam (XANAX) 0.5 MG tablet Take 1 tablet (0.5 mg total) by mouth 3 (three) times daily as needed.  90 tablet  1  . atorvastatin (LIPITOR) 20 MG tablet Take 1 tablet (20 mg total) by mouth daily.  90 tablet  1  . buPROPion (WELLBUTRIN XL) 300 MG 24 hr tablet Take 1 tablet (300 mg total) by mouth daily.  90 tablet  0  . FLUoxetine (PROZAC) 20 MG capsule Take 3 capsules (60 mg total) by mouth daily.  90 capsule  6  . fluticasone (FLONASE) 50 MCG/ACT nasal spray Place 2 sprays into the nose daily.  16 g  3  . hydrOXYzine (ATARAX/VISTARIL) 25 MG tablet Take 25 mg by mouth 2 (two) times daily as needed.       Marland Kitchen losartan-hydrochlorothiazide (HYZAAR) 100-25 MG per tablet Take 1 tablet by mouth daily.  90 tablet  1  . meloxicam (MOBIC) 7.5 MG tablet Take 1 tablet (7.5 mg total) by mouth daily.  90 tablet  1  . Multiple Vitamin  (MULTIVITAMIN) capsule Take 1 capsule by mouth daily.      . SUMAtriptan (IMITREX) 50 MG tablet Take 50 mg by mouth as needed.      . tretinoin (RETIN-A) 0.05 % cream Apply 1 application topically at bedtime.  45 g  3    Review of Systems  Constitutional: Negative for fever, chills, appetite change, fatigue and unexpected weight change.  HENT: Negative for ear pain, congestion, sore throat, trouble swallowing, neck pain, voice change and sinus pressure.   Eyes: Negative for visual disturbance.  Respiratory: Negative for cough, shortness of breath, wheezing and stridor.   Cardiovascular: Negative for chest pain, palpitations and leg swelling.  Gastrointestinal: Negative for nausea, vomiting, abdominal pain, diarrhea, constipation, blood in stool, abdominal distention and anal bleeding.  Genitourinary: Negative for dysuria and flank pain.  Musculoskeletal: Negative for myalgias, arthralgias and gait problem.  Skin: Negative for color change and rash.  Neurological: Negative for dizziness and headaches.  Hematological: Negative for adenopathy. Does not bruise/bleed easily.  Psychiatric/Behavioral: Positive for sleep disturbance and dysphoric mood. Negative for suicidal ideas. The patient is nervous/anxious.    BP 118/79  Pulse 79  Temp(Src) 98.5 F (36.9 C) (Oral)  Ht 5\' 4"  (1.626 m)  Wt 192 lb 12 oz (87.431 kg)  BMI 33.09 kg/m2     Objective:   Physical Exam  Constitutional: She is oriented to person,  place, and time. She appears well-developed and well-nourished. No distress.  HENT:  Head: Normocephalic and atraumatic.  Right Ear: External ear normal.  Left Ear: External ear normal.  Nose: Nose normal.  Mouth/Throat: Oropharynx is clear and moist. No oropharyngeal exudate.  Eyes: Conjunctivae are normal. Pupils are equal, round, and reactive to light. Right eye exhibits no discharge. Left eye exhibits no discharge. No scleral icterus.  Neck: Normal range of motion. Neck supple.  No tracheal deviation present. No thyromegaly present.  Cardiovascular: Normal rate, regular rhythm, normal heart sounds and intact distal pulses.  Exam reveals no gallop and no friction rub.   No murmur heard. Pulmonary/Chest: Effort normal and breath sounds normal. No respiratory distress. She has no wheezes. She has no rales. She exhibits no tenderness.  Musculoskeletal: Normal range of motion. She exhibits no edema and no tenderness.  Lymphadenopathy:    She has no cervical adenopathy.  Neurological: She is alert and oriented to person, place, and time. No cranial nerve deficit. She exhibits normal muscle tone. Coordination normal.  Skin: Skin is warm and dry. No rash noted. She is not diaphoretic. No erythema. No pallor.  Psychiatric: She has a normal mood and affect. Her behavior is normal. Judgment and thought content normal.          Assessment & Plan:

## 2011-10-12 ENCOUNTER — Other Ambulatory Visit: Payer: Self-pay | Admitting: *Deleted

## 2011-10-12 MED ORDER — FLUTICASONE PROPIONATE 50 MCG/ACT NA SUSP: 2.0000 | Freq: Every day | NASAL | Status: DC

## 2011-10-12 NOTE — Telephone Encounter (Signed)
Done

## 2011-11-09 ENCOUNTER — Encounter: Payer: Self-pay | Admitting: Internal Medicine

## 2011-11-16 ENCOUNTER — Other Ambulatory Visit: Payer: Self-pay | Admitting: Internal Medicine

## 2011-12-09 ENCOUNTER — Ambulatory Visit: Payer: PRIVATE HEALTH INSURANCE | Admitting: Internal Medicine

## 2011-12-21 ENCOUNTER — Other Ambulatory Visit: Payer: Self-pay | Admitting: *Deleted

## 2011-12-21 MED ORDER — ALPRAZOLAM 0.5 MG PO TABS: 0.5000 mg | ORAL_TABLET | Freq: Three times a day (TID) | ORAL | Status: DC | PRN

## 2011-12-29 NOTE — Telephone Encounter (Signed)
Rx called to [facility] pharmacy. 

## 2012-01-20 ENCOUNTER — Other Ambulatory Visit: Payer: Self-pay | Admitting: *Deleted

## 2012-01-20 ENCOUNTER — Ambulatory Visit: Payer: PRIVATE HEALTH INSURANCE | Admitting: Internal Medicine

## 2012-01-20 MED ORDER — HYDROXYZINE HCL 25 MG PO TABS: 25.0000 mg | ORAL_TABLET | Freq: Two times a day (BID) | ORAL | Status: DC | PRN

## 2012-02-11 ENCOUNTER — Ambulatory Visit (INDEPENDENT_AMBULATORY_CARE_PROVIDER_SITE_OTHER): Payer: PRIVATE HEALTH INSURANCE | Admitting: Internal Medicine

## 2012-02-11 ENCOUNTER — Encounter: Payer: Self-pay | Admitting: Internal Medicine

## 2012-02-11 VITALS — BP 132/88 | HR 82 | Temp 99.2°F | Ht 64.0 in | Wt 196.5 lb

## 2012-02-11 DIAGNOSIS — I10 Essential (primary) hypertension: Secondary | ICD-10-CM

## 2012-02-11 DIAGNOSIS — N390 Urinary tract infection, site not specified: Secondary | ICD-10-CM

## 2012-02-11 DIAGNOSIS — J320 Chronic maxillary sinusitis: Secondary | ICD-10-CM | POA: Insufficient documentation

## 2012-02-11 DIAGNOSIS — F32A Depression, unspecified: Secondary | ICD-10-CM

## 2012-02-11 DIAGNOSIS — F329 Major depressive disorder, single episode, unspecified: Secondary | ICD-10-CM

## 2012-02-11 DIAGNOSIS — E785 Hyperlipidemia, unspecified: Secondary | ICD-10-CM

## 2012-02-11 DIAGNOSIS — F3289 Other specified depressive episodes: Secondary | ICD-10-CM

## 2012-02-11 DIAGNOSIS — Z853 Personal history of malignant neoplasm of breast: Secondary | ICD-10-CM

## 2012-02-11 LAB — COMPREHENSIVE METABOLIC PANEL
ALT: 21 U/L (ref 0–35)
AST: 27 U/L (ref 0–37)
Alkaline Phosphatase: 64 U/L (ref 39–117)
BUN: 17 mg/dL (ref 6–23)
Chloride: 102 mEq/L (ref 96–112)
Creatinine, Ser: 0.8 mg/dL (ref 0.4–1.2)
Total Bilirubin: 0.5 mg/dL (ref 0.3–1.2)

## 2012-02-11 MED ORDER — AMOXICILLIN-POT CLAVULANATE 875-125 MG PO TABS: 1.0000 | ORAL_TABLET | Freq: Two times a day (BID) | ORAL | Status: AC

## 2012-02-11 NOTE — Assessment & Plan Note (Signed)
BP well controlled on current medications. Will check renal function and urine microalbumin today. Follow up 6 months.

## 2012-02-11 NOTE — Progress Notes (Signed)
Subjective:    Patient ID: Dana Obrien, female    DOB: Apr 02, 1946, 66 y.o.   MRN: [id number]  HPI 66 year old female with history of hypertension, hyperlipidemia, depression presents for followup. She reports she has been doing well. Over the last few months, she reports that depression has improved. She has moved back into her home. Her husband has been able to get a job and help support her mortgage. She reports things are going well at work.  In regards to chronic issues of hyperlipidemia and hypertension, she reports full compliance with medications. She denies any noted symptoms from her medicines.  She is concerned today about two-week history of left upper jaw pain. She was seen by her dentist because she was concerned about potential cavity. She reports that x-rays performed were normal. He thought she might be clenching her teeth at night and started Flexeril. She reports symptoms have not improved. She continues to have left upper jaw pain and nasal congestion. She denies any fever or chills. She denies any cough.  Outpatient Encounter Prescriptions as of 02/11/2012  Medication Sig Dispense Refill  . albuterol (PROVENTIL HFA;VENTOLIN HFA) 108 (90 BASE) MCG/ACT inhaler Inhale 2 puffs into the lungs every 6 (six) hours as needed.        . ALPRAZolam (XANAX) 0.5 MG tablet Take 1 tablet (0.5 mg total) by mouth 3 (three) times daily as needed.  90 tablet  1  . atorvastatin (LIPITOR) 20 MG tablet Take 1 tablet (20 mg total) by mouth daily.  90 tablet  1  . buPROPion (WELLBUTRIN XL) 300 MG 24 hr tablet TAKE ONE (1) TABLET BY MOUTH EVERY      DAY  90 tablet  3  . FLUoxetine (PROZAC) 20 MG capsule Take 3 capsules (60 mg total) by mouth daily.  90 capsule  6  . fluticasone (FLONASE) 50 MCG/ACT nasal spray Place 2 sprays into the nose daily.  16 g  3  . hydrOXYzine (ATARAX/VISTARIL) 25 MG tablet Take 1 tablet (25 mg total) by mouth 2 (two) times daily as needed.  30 tablet  2  .  losartan-hydrochlorothiazide (HYZAAR) 100-25 MG per tablet Take 1 tablet by mouth daily.  90 tablet  1  . meloxicam (MOBIC) 7.5 MG tablet Take 1 tablet (7.5 mg total) by mouth daily.  90 tablet  1  . Multiple Vitamin (MULTIVITAMIN) capsule Take 1 capsule by mouth daily.      . SUMAtriptan (IMITREX) 50 MG tablet Take 50 mg by mouth as needed.      . tretinoin (RETIN-A) 0.05 % cream Apply 1 application topically at bedtime.  45 g  3  . amoxicillin-clavulanate (AUGMENTIN) 875-125 MG per tablet Take 1 tablet by mouth 2 (two) times daily.  20 tablet  0   BP 132/88  Pulse 82  Temp 99.2 F (37.3 C) (Oral)  Ht 5\' 4"  (1.626 m)  Wt 196 lb 8 oz (89.132 kg)  BMI 33.73 kg/m2  SpO2 96%  Review of Systems  Constitutional: Negative for fever, chills, appetite change, fatigue and unexpected weight change.  HENT: Positive for congestion and sinus pressure. Negative for ear pain, sore throat, trouble swallowing, neck pain and voice change.   Eyes: Negative for visual disturbance.  Respiratory: Negative for cough, shortness of breath, wheezing and stridor.   Cardiovascular: Negative for chest pain, palpitations and leg swelling.  Gastrointestinal: Negative for nausea, vomiting, abdominal pain, diarrhea, constipation, blood in stool, abdominal distention and anal bleeding.  Genitourinary: Negative for dysuria  and flank pain.  Musculoskeletal: Negative for myalgias, arthralgias and gait problem.  Skin: Negative for color change and rash.  Neurological: Negative for dizziness and headaches.  Hematological: Negative for adenopathy. Does not bruise/bleed easily.  Psychiatric/Behavioral: Negative for suicidal ideas, disturbed wake/sleep cycle and dysphoric mood. The patient is not nervous/anxious.        Objective:   Physical Exam  Constitutional: She is oriented to person, place, and time. She appears well-developed and well-nourished. No distress.  HENT:  Head: Normocephalic and atraumatic.  Right Ear:  External ear normal.  Left Ear: External ear normal.  Nose: Nose normal.  Mouth/Throat: Oropharynx is clear and moist. No oropharyngeal exudate.  Eyes: Conjunctivae normal are normal. Pupils are equal, round, and reactive to light. Right eye exhibits no discharge. Left eye exhibits no discharge. No scleral icterus.  Neck: Normal range of motion. Neck supple. No tracheal deviation present. No thyromegaly present.  Cardiovascular: Normal rate, regular rhythm, normal heart sounds and intact distal pulses.  Exam reveals no gallop and no friction rub.   No murmur heard. Pulmonary/Chest: Effort normal and breath sounds normal. No respiratory distress. She has no wheezes. She has no rales. She exhibits no tenderness.  Abdominal: Soft. Bowel sounds are normal. She exhibits no distension and no mass. There is no tenderness. There is no guarding.  Musculoskeletal: Normal range of motion. She exhibits no edema and no tenderness.  Lymphadenopathy:    She has no cervical adenopathy.  Neurological: She is alert and oriented to person, place, and time. No cranial nerve deficit. She exhibits normal muscle tone. Coordination normal.  Skin: Skin is warm and dry. No rash noted. She is not diaphoretic. No erythema. No pallor.  Psychiatric: She has a normal mood and affect. Her behavior is normal. Judgment and thought content normal.          Assessment & Plan:

## 2012-02-11 NOTE — Assessment & Plan Note (Signed)
Symptoms of left upper jaw pain and congestion are most consistent with acute maxillary sinusitis. Will treat with Augmentin. Patient will call if symptoms are not improving over the next 72 hours.

## 2012-02-11 NOTE — Assessment & Plan Note (Signed)
Patient with history of breast cancer recently discharged from her oncologist care. Previously having CA 125 checked every 6 months. Will check with labs today.

## 2012-02-11 NOTE — Assessment & Plan Note (Signed)
Symptoms improved. Pt back in her home, doing well. Will plan to continue Prozac. Follow up 6 months and prn.

## 2012-02-11 NOTE — Assessment & Plan Note (Signed)
Lipids well controlled at visit in 08/2011. Will plan to recheck yearly. Continue lipitor. Will check LFTs with labs today.

## 2012-02-12 ENCOUNTER — Other Ambulatory Visit: Payer: Self-pay | Admitting: *Deleted

## 2012-02-12 LAB — POCT URINALYSIS DIPSTICK
Glucose, UA: NEGATIVE
Nitrite, UA: NEGATIVE
Protein, UA: NEGATIVE
Spec Grav, UA: 1.02
Urobilinogen, UA: 0.2

## 2012-02-12 LAB — CA 125: CA 125: 9.4 U/mL (ref 0.0–30.2)

## 2012-02-12 LAB — MICROALBUMIN / CREATININE URINE RATIO
Creatinine,U: 149.5 mg/dL
Microalb Creat Ratio: 0.4 mg/g (ref 0.0–30.0)

## 2012-02-12 MED ORDER — CIPROFLOXACIN HCL 500 MG PO TABS: 500.0000 mg | ORAL_TABLET | Freq: Two times a day (BID) | ORAL | Status: DC

## 2012-02-12 NOTE — Addendum Note (Signed)
Addended by: Mauri Reading on: 02/12/2012 03:27 PM   Modules accepted: Orders

## 2012-02-12 NOTE — Addendum Note (Signed)
Addended by: Mauri Reading on: 02/12/2012 03:25 PM   Modules accepted: Orders

## 2012-02-14 LAB — URINE CULTURE: Colony Count: 1000

## 2012-04-01 ENCOUNTER — Other Ambulatory Visit: Payer: Self-pay | Admitting: Internal Medicine

## 2012-04-01 MED ORDER — LOSARTAN POTASSIUM-HCTZ 100-25 MG PO TABS: 1.0000 | ORAL_TABLET | Freq: Every day | ORAL | Status: DC

## 2012-04-01 NOTE — Telephone Encounter (Signed)
Refill request for losartan-hydrochlorothiazide 100-25 mg per tablet Sig: take one tablet by mouth

## 2012-06-03 ENCOUNTER — Other Ambulatory Visit: Payer: Self-pay | Admitting: Internal Medicine

## 2012-06-03 NOTE — Telephone Encounter (Signed)
Prozac 20 mg   Take 3 capsules by mouth daily  # 90

## 2012-06-03 NOTE — Telephone Encounter (Signed)
Fine to fill. 

## 2012-06-06 MED ORDER — FLUOXETINE HCL 20 MG PO CAPS: 60.0000 mg | ORAL_CAPSULE | Freq: Every day | ORAL | Status: DC

## 2012-06-06 NOTE — Telephone Encounter (Signed)
Med phoned in °

## 2012-06-30 ENCOUNTER — Encounter: Payer: Self-pay | Admitting: Adult Health

## 2012-06-30 ENCOUNTER — Ambulatory Visit (INDEPENDENT_AMBULATORY_CARE_PROVIDER_SITE_OTHER): Payer: PRIVATE HEALTH INSURANCE | Admitting: Adult Health

## 2012-06-30 VITALS — BP 143/74 | HR 81 | Temp 98.5°F | Resp 14 | Ht 64.0 in | Wt 197.0 lb

## 2012-06-30 DIAGNOSIS — R300 Dysuria: Secondary | ICD-10-CM | POA: Insufficient documentation

## 2012-06-30 DIAGNOSIS — N390 Urinary tract infection, site not specified: Secondary | ICD-10-CM

## 2012-06-30 LAB — POCT URINALYSIS DIPSTICK
Leukocytes, UA: NEGATIVE
Nitrite, UA: NEGATIVE
Protein, UA: NEGATIVE
pH, UA: 5.5

## 2012-06-30 MED ORDER — CIPROFLOXACIN HCL 250 MG PO TABS: 250.0000 mg | ORAL_TABLET | Freq: Two times a day (BID) | ORAL | Status: DC

## 2012-06-30 NOTE — Assessment & Plan Note (Signed)
Symptoms consistent with UTI. UA dip w/o leukocytes or nitrites. Some blood. Will send for culture. Start Cipro. May use Azo or Urostat for dysuria x 2 days.

## 2012-06-30 NOTE — Patient Instructions (Addendum)
  Please start your antibiotic today.  You can also take AZO or Urostat which are sold OTC for your urinary discomfort. You can take this for 2-3 days.  Drink plenty of fluids.  Please call if your symptoms are not improved within 3 days.

## 2012-06-30 NOTE — Progress Notes (Signed)
  Subjective:    Patient ID: Dana Obrien, female    DOB: 15-Dec-1945, 67 y.o.   MRN: [id number]  HPI  Dana Obrien is a very pleasant 67 y/o female who presents to clinic with dysuria and c/o strong odor in urine. She reports that her symptoms have been ongoing intermittently for ~ 2 weeks. She has not tried any OTC medications. She has been drinking cranberry juice. She denies any systemic symptoms - no fever, chills.  Current Outpatient Prescriptions on File Prior to Visit  Medication Sig Dispense Refill  . albuterol (PROVENTIL HFA;VENTOLIN HFA) 108 (90 BASE) MCG/ACT inhaler Inhale 2 puffs into the lungs every 6 (six) hours as needed.        . ALPRAZolam (XANAX) 0.5 MG tablet Take 1 tablet (0.5 mg total) by mouth 3 (three) times daily as needed.  90 tablet  1  . buPROPion (WELLBUTRIN XL) 300 MG 24 hr tablet TAKE ONE (1) TABLET BY MOUTH EVERY      DAY  90 tablet  3  . FLUoxetine (PROZAC) 20 MG capsule Take 3 capsules (60 mg total) by mouth daily.  90 capsule  6  . fluticasone (FLONASE) 50 MCG/ACT nasal spray Place 2 sprays into the nose daily.  16 g  3  . hydrOXYzine (ATARAX/VISTARIL) 25 MG tablet Take 1 tablet (25 mg total) by mouth 2 (two) times daily as needed.  30 tablet  2  . losartan-hydrochlorothiazide (HYZAAR) 100-25 MG per tablet Take 1 tablet by mouth daily.  90 tablet  3  . meloxicam (MOBIC) 7.5 MG tablet Take 1 tablet (7.5 mg total) by mouth daily.  90 tablet  1  . Multiple Vitamin (MULTIVITAMIN) capsule Take 1 capsule by mouth daily.      . SUMAtriptan (IMITREX) 50 MG tablet Take 50 mg by mouth as needed.      . tretinoin (RETIN-A) 0.05 % cream Apply 1 application topically at bedtime.  45 g  3  . atorvastatin (LIPITOR) 20 MG tablet Take 1 tablet (20 mg total) by mouth daily.  90 tablet  1     Review of Systems  Constitutional: Negative.   Respiratory: Negative.   Cardiovascular: Negative.   Gastrointestinal: Negative.   Genitourinary: Positive for dysuria and frequency.  Negative for flank pain and pelvic pain.  Neurological: Negative.   Psychiatric/Behavioral: Negative.     BP 143/74  Pulse 81  Temp 98.5 F (36.9 C) (Oral)  Resp 14  Ht 5\' 4"  (1.626 m)  Wt 197 lb (89.359 kg)  BMI 33.82 kg/m2  SpO2 95%     Objective:   Physical Exam  Constitutional: She is oriented to person, place, and time. She appears well-developed and well-nourished. No distress.  Cardiovascular: Normal rate and regular rhythm.   Pulmonary/Chest: Effort normal and breath sounds normal.  Abdominal: Soft. Bowel sounds are normal.  Neurological: She is alert and oriented to person, place, and time.  Skin: Skin is warm and dry. No rash noted. No erythema.  Psychiatric: She has a normal mood and affect. Her behavior is normal. Judgment and thought content normal.       Assessment & Plan:

## 2012-07-01 ENCOUNTER — Ambulatory Visit: Payer: PRIVATE HEALTH INSURANCE | Admitting: Adult Health

## 2012-07-02 LAB — URINE CULTURE

## 2012-07-04 ENCOUNTER — Encounter: Payer: Self-pay | Admitting: Internal Medicine

## 2012-07-16 ENCOUNTER — Other Ambulatory Visit: Payer: Self-pay

## 2012-08-01 ENCOUNTER — Telehealth: Payer: Self-pay | Admitting: *Deleted

## 2012-08-01 NOTE — Telephone Encounter (Signed)
Refill Request  Lipitor 20 mg tab  #90  Take 1 tablet by mouth daily

## 2012-08-02 MED ORDER — ATORVASTATIN CALCIUM 20 MG PO TABS: 20.0000 mg | ORAL_TABLET | Freq: Every day | ORAL | Status: DC

## 2012-08-02 NOTE — Telephone Encounter (Signed)
Rx has been sent to [facility] per patient request

## 2012-08-16 ENCOUNTER — Encounter: Payer: PRIVATE HEALTH INSURANCE | Admitting: Internal Medicine

## 2012-08-17 ENCOUNTER — Encounter: Payer: PRIVATE HEALTH INSURANCE | Admitting: Internal Medicine

## 2012-09-12 ENCOUNTER — Other Ambulatory Visit: Payer: Self-pay | Admitting: *Deleted

## 2012-09-12 MED ORDER — ALPRAZOLAM 0.5 MG PO TABS: 0.5000 mg | ORAL_TABLET | Freq: Three times a day (TID) | ORAL | Status: DC | PRN

## 2012-09-29 ENCOUNTER — Encounter: Payer: PRIVATE HEALTH INSURANCE | Admitting: Internal Medicine

## 2012-09-29 ENCOUNTER — Telehealth: Payer: Self-pay | Admitting: Internal Medicine

## 2012-09-29 MED ORDER — ALBUTEROL SULFATE HFA 108 (90 BASE) MCG/ACT IN AERS: 2.0000 | INHALATION_SPRAY | Freq: Four times a day (QID) | RESPIRATORY_TRACT | Status: DC | PRN

## 2012-09-29 MED ORDER — LOSARTAN POTASSIUM-HCTZ 100-25 MG PO TABS: 1.0000 | ORAL_TABLET | Freq: Every day | ORAL | Status: DC

## 2012-09-29 MED ORDER — BUPROPION HCL ER (XL) 300 MG PO TB24: ORAL_TABLET | ORAL | Status: DC

## 2012-09-29 MED ORDER — ALPRAZOLAM 0.5 MG PO TABS: 0.5000 mg | ORAL_TABLET | Freq: Three times a day (TID) | ORAL | Status: DC | PRN

## 2012-09-29 MED ORDER — ATORVASTATIN CALCIUM 20 MG PO TABS: 20.0000 mg | ORAL_TABLET | Freq: Every day | ORAL | Status: DC

## 2012-09-29 MED ORDER — TRETINOIN 0.05 % EX CREA: 1.0000 "application " | TOPICAL_CREAM | Freq: Every day | CUTANEOUS | Status: DC

## 2012-09-29 MED ORDER — FLUOXETINE HCL 20 MG PO CAPS: 60.0000 mg | ORAL_CAPSULE | Freq: Every day | ORAL | Status: DC

## 2012-09-29 MED ORDER — HYDROXYZINE HCL 25 MG PO TABS: 25.0000 mg | ORAL_TABLET | Freq: Two times a day (BID) | ORAL | Status: DC | PRN

## 2012-09-29 NOTE — Telephone Encounter (Signed)
Faxed

## 2012-09-29 NOTE — Telephone Encounter (Signed)
tretinoin (RETIN-A) 0.05 % cream  hydrOXYzine (ATARAX/VISTARIL) 25 MG tablet  atorvastatin (LIPITOR) 20 MG tablet  meloxicam (MOBIC) 7.5 MG tablet  FLUoxetine (PROZAC) 20 MG capsule  buPROPion (WELLBUTRIN XL) 300 MG 24 hr tablet  losartan-hydrochlorothiazide (HYZAAR) 100-25 MG per tablet  ALPRAZolam (XANAX) 0.5 MG tablet  fluticasone (FLONASE) 50 MCG/ACT nasal spray  albuterol (PROVENTIL HFA;VENTOLIN HFA) 108 (90 BASE) MCG/ACT inhaler

## 2012-10-19 ENCOUNTER — Encounter: Payer: Self-pay | Admitting: Internal Medicine

## 2012-10-19 ENCOUNTER — Ambulatory Visit (INDEPENDENT_AMBULATORY_CARE_PROVIDER_SITE_OTHER): Payer: PRIVATE HEALTH INSURANCE | Admitting: Internal Medicine

## 2012-10-19 VITALS — BP 130/88 | HR 83 | Temp 98.4°F | Ht 64.25 in | Wt 197.0 lb

## 2012-10-19 DIAGNOSIS — Z23 Encounter for immunization: Secondary | ICD-10-CM

## 2012-10-19 DIAGNOSIS — E669 Obesity, unspecified: Secondary | ICD-10-CM

## 2012-10-19 DIAGNOSIS — E785 Hyperlipidemia, unspecified: Secondary | ICD-10-CM

## 2012-10-19 DIAGNOSIS — Z1211 Encounter for screening for malignant neoplasm of colon: Secondary | ICD-10-CM

## 2012-10-19 DIAGNOSIS — I10 Essential (primary) hypertension: Secondary | ICD-10-CM

## 2012-10-19 DIAGNOSIS — Z0000 Encounter for general adult medical examination without abnormal findings: Secondary | ICD-10-CM

## 2012-10-19 LAB — COMPREHENSIVE METABOLIC PANEL
AST: 20 U/L (ref 0–37)
Albumin: 3.9 g/dL (ref 3.5–5.2)
BUN: 15 mg/dL (ref 6–23)
Calcium: 9.2 mg/dL (ref 8.4–10.5)
Chloride: 97 mEq/L (ref 96–112)
Glucose, Bld: 89 mg/dL (ref 70–99)
Potassium: 4 mEq/L (ref 3.5–5.1)
Total Protein: 7.4 g/dL (ref 6.0–8.3)

## 2012-10-19 LAB — CBC WITH DIFFERENTIAL/PLATELET
Basophils Relative: 0.8 % (ref 0.0–3.0)
Eosinophils Relative: 3 % (ref 0.0–5.0)
Lymphocytes Relative: 29.5 % (ref 12.0–46.0)
MCV: 93.8 fl (ref 78.0–100.0)
Monocytes Absolute: 0.6 10*3/uL (ref 0.1–1.0)
Monocytes Relative: 8 % (ref 3.0–12.0)
Neutrophils Relative %: 58.7 % (ref 43.0–77.0)
RBC: 4.18 Mil/uL (ref 3.87–5.11)
WBC: 7.8 10*3/uL (ref 4.5–10.5)

## 2012-10-19 LAB — LIPID PANEL: Triglycerides: 168 mg/dL — ABNORMAL HIGH (ref 0.0–149.0)

## 2012-10-19 LAB — MICROALBUMIN / CREATININE URINE RATIO
Microalb Creat Ratio: 0.7 mg/g (ref 0.0–30.0)
Microalb, Ur: 0.4 mg/dL (ref 0.0–1.9)

## 2012-10-19 LAB — LDL CHOLESTEROL, DIRECT: Direct LDL: 148 mg/dL

## 2012-10-19 MED ORDER — PHENTERMINE-TOPIRAMATE ER 3.75-23 MG PO CP24: 1.0000 | ORAL_CAPSULE | Freq: Every day | ORAL | Status: DC

## 2012-10-19 MED ORDER — ZOSTER VACCINE LIVE 19400 UNT/0.65ML ~~LOC~~ SOLR: 0.6500 mL | Freq: Once | SUBCUTANEOUS | Status: DC

## 2012-10-19 MED ORDER — BUPROPION HCL ER (XL) 300 MG PO TB24: ORAL_TABLET | ORAL | Status: DC

## 2012-10-19 MED ORDER — MELOXICAM 7.5 MG PO TABS: 7.5000 mg | ORAL_TABLET | Freq: Every day | ORAL | Status: DC

## 2012-10-19 MED ORDER — ALPRAZOLAM 0.5 MG PO TABS: 0.5000 mg | ORAL_TABLET | Freq: Three times a day (TID) | ORAL | Status: DC | PRN

## 2012-10-19 MED ORDER — ATORVASTATIN CALCIUM 20 MG PO TABS: 20.0000 mg | ORAL_TABLET | Freq: Every day | ORAL | Status: DC

## 2012-10-19 MED ORDER — TRETINOIN 0.05 % EX CREA: 1.0000 "application " | TOPICAL_CREAM | Freq: Every day | CUTANEOUS | Status: DC

## 2012-10-19 MED ORDER — SUMATRIPTAN SUCCINATE 50 MG PO TABS: 50.0000 mg | ORAL_TABLET | ORAL | Status: DC | PRN

## 2012-10-19 MED ORDER — FLUOXETINE HCL 20 MG PO CAPS: 60.0000 mg | ORAL_CAPSULE | Freq: Every day | ORAL | Status: DC

## 2012-10-19 MED ORDER — FLUTICASONE PROPIONATE 50 MCG/ACT NA SUSP: 2.0000 | Freq: Every day | NASAL | Status: DC

## 2012-10-19 MED ORDER — HYDROXYZINE HCL 25 MG PO TABS: 25.0000 mg | ORAL_TABLET | Freq: Two times a day (BID) | ORAL | Status: DC | PRN

## 2012-10-19 NOTE — Assessment & Plan Note (Signed)
Wt Readings from Last 3 Encounters:  10/19/12 197 lb (89.359 kg)  06/30/12 197 lb (89.359 kg)  02/11/12 196 lb 8 oz (89.132 kg)   Body mass index is 33.55 kg/(m^2). Encouraged her to keep a food diary. Encouraged goal of exercise 5hr per week. Will start Qsymia to help with appetite. Follow up for BP recheck and weight recheck in 1 month.

## 2012-10-19 NOTE — Progress Notes (Signed)
Subjective:    Patient ID: Dana Obrien, female    DOB: Nov 18, 1945, 67 y.o.   MRN: [id number]  HPI  The patient is here for annual Medicare wellness examination and management of other chronic and acute problems.   The risk factors are reflected in the social history.  The roster of all physicians providing medical care to patient - is listed in the Snapshot section of the chart.  Activities of daily living:  The patient is 100% independent in all ADLs: dressing, toileting, feeding as well as independent mobility  Home safety : The patient has smoke detectors in the home. They wear seatbelts.  There are no firearms at home. There is no violence in the home.   There is no risks for hepatitis, STDs or HIV. There is no history of blood transfusion. They have no travel history to infectious disease endemic areas of the world.  The patient has seen their dentist in the last six month. Dentist - Dr. Joesphine Bare  They have seen their eye doctor in the last year.  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 Wears hearing aids, last changed 1 month ago. [facility]  They do not  have excessive sun exposure. Discussed the need for sun protection: hats, long sleeves and use of sunscreen if there is significant sun exposure. Dermatologist - Dr. Jarold Motto in the past  Diet: the importance of a healthy diet is discussed. They do have a relatively healthy diet.  The benefits of regular aerobic exercise were discussed. She walks typically daily.  Depression screen: there are no signs or vegative symptoms of depression- irritability, change in appetite, anhedonia, sadness/tearfullness.  Cognitive assessment: the patient manages all their financial and personal affairs and is actively engaged. They could relate day,date,year and events.  The following portions of the patient's history were reviewed and updated as appropriate: allergies, current medications,  past family history, past medical history,  past surgical history, past social history  and problem list.  Visual acuity was not assessed per patient preference since she has regular follow up with her ophthalmologist. Hearing and body mass index were assessed and reviewed.   During the course of the visit the patient was educated and counseled about appropriate screening and preventive services including : fall prevention , diabetes screening, nutrition counseling, colorectal cancer screening, and recommended immunizations.    Outpatient Encounter Prescriptions as of 10/19/2012  Medication Sig Dispense Refill  . albuterol (PROVENTIL HFA;VENTOLIN HFA) 108 (90 BASE) MCG/ACT inhaler Inhale 2 puffs into the lungs every 6 (six) hours as needed.  1 Inhaler  3  . ALPRAZolam (XANAX) 0.5 MG tablet Take 1 tablet (0.5 mg total) by mouth 3 (three) times daily as needed.  90 tablet  1  . atorvastatin (LIPITOR) 20 MG tablet Take 1 tablet (20 mg total) by mouth daily.  90 tablet  4  . buPROPion (WELLBUTRIN XL) 300 MG 24 hr tablet TAKE ONE (1) TABLET BY MOUTH EVERY      DAY  90 tablet  4  . FLUoxetine (PROZAC) 20 MG capsule Take 3 capsules (60 mg total) by mouth daily.  90 capsule  4  . fluticasone (FLONASE) 50 MCG/ACT nasal spray Place 2 sprays into the nose daily.  16 g  3  . hydrOXYzine (ATARAX/VISTARIL) 25 MG tablet Take 1 tablet (25 mg total) by mouth 2 (two) times daily as needed.  90 tablet  4  . losartan-hydrochlorothiazide (HYZAAR) 100-25 MG per  tablet Take 1 tablet by mouth daily.  90 tablet  0  . meloxicam (MOBIC) 7.5 MG tablet Take 1 tablet (7.5 mg total) by mouth daily.  90 tablet  1  . Multiple Vitamin (MULTIVITAMIN) capsule Take 1 capsule by mouth daily.      . SUMAtriptan (IMITREX) 50 MG tablet Take 1 tablet (50 mg total) by mouth as needed.  10 tablet  4  . tretinoin (RETIN-A) 0.05 % cream Apply 1 application topically at bedtime.  45 g  3  . ciprofloxacin (CIPRO) 250 MG tablet Take 1 tablet  (250 mg total) by mouth 2 (two) times daily.  6 tablet  0  . Phentermine-Topiramate (QSYMIA) 3.75-23 MG CP24 Take 1 capsule by mouth daily.  30 capsule  1  . zoster vaccine live, PF, (ZOSTAVAX) 11914 UNT/0.65ML injection Inject 19,400 Units into the skin once.  1 each  0   No facility-administered encounter medications on file as of 10/19/2012.   BP 130/88  Pulse 83  Temp(Src) 98.4 F (36.9 C) (Oral)  Ht 5' 4.25" (1.632 m)  Wt 197 lb (89.359 kg)  BMI 33.55 kg/m2  SpO2 97%  Review of Systems  Constitutional: Negative for fever, chills, appetite change, fatigue and unexpected weight change.  HENT: Negative for ear pain, congestion, sore throat, trouble swallowing, neck pain, voice change and sinus pressure.   Eyes: Negative for visual disturbance.  Respiratory: Negative for cough, shortness of breath, wheezing and stridor.   Cardiovascular: Negative for chest pain, palpitations and leg swelling.  Gastrointestinal: Negative for nausea, vomiting, abdominal pain, diarrhea, constipation, blood in stool, abdominal distention and anal bleeding.  Genitourinary: Negative for dysuria and flank pain.  Musculoskeletal: Negative for myalgias, arthralgias and gait problem.  Skin: Negative for color change and rash.  Neurological: Negative for dizziness and headaches.  Hematological: Negative for adenopathy. Does not bruise/bleed easily.  Psychiatric/Behavioral: Negative for suicidal ideas, sleep disturbance and dysphoric mood. The patient is not nervous/anxious.        Objective:   Physical Exam  Constitutional: She is oriented to person, place, and time. She appears well-developed and well-nourished. No distress.  HENT:  Head: Normocephalic and atraumatic.  Right Ear: External ear normal.  Left Ear: External ear normal.  Nose: Nose normal.  Mouth/Throat: Oropharynx is clear and moist. No oropharyngeal exudate.  Eyes: Conjunctivae are normal. Pupils are equal, round, and reactive to light.  Right eye exhibits no discharge. Left eye exhibits no discharge. No scleral icterus.  Neck: Normal range of motion. Neck supple. No tracheal deviation present. No thyromegaly present.  Cardiovascular: Normal rate, regular rhythm, normal heart sounds and intact distal pulses.  Exam reveals no gallop and no friction rub.   No murmur heard. Pulmonary/Chest: Effort normal and breath sounds normal. No accessory muscle usage. Not tachypneic. No respiratory distress. She has no decreased breath sounds. She has no wheezes. She has no rhonchi. She has no rales. She exhibits no tenderness. Right breast exhibits no inverted nipple, no mass, no nipple discharge, no skin change and no tenderness. Left breast exhibits no inverted nipple, no mass, no nipple discharge, no skin change and no tenderness. Breasts are symmetrical.  Abdominal: Soft. Bowel sounds are normal. She exhibits no distension and no mass. There is no tenderness. There is no rebound and no guarding.  Musculoskeletal: Normal range of motion. She exhibits no edema and no tenderness.  Lymphadenopathy:    She has no cervical adenopathy.  Neurological: She is alert and oriented to person,  place, and time. No cranial nerve deficit. She exhibits normal muscle tone. Coordination normal.  Skin: Skin is warm and dry. No rash noted. She is not diaphoretic. No erythema. No pallor.  Psychiatric: She has a normal mood and affect. Her behavior is normal. Judgment and thought content normal.          Assessment & Plan:

## 2012-10-19 NOTE — Assessment & Plan Note (Signed)
General medical exam including breast exam normal today. Pap deferred as normal 2012. Health maintenance is up to date except for mammogram which was ordered. Followup colonoscopy also ordered. Will check labs including CBC, CMP, lipid profile. Pneumovax given today. Prescription for her shingles vaccine given today. Encouraged healthy diet and regular physical activity. Followup in one month for weight check.

## 2012-11-10 ENCOUNTER — Telehealth: Payer: Self-pay | Admitting: *Deleted

## 2012-11-10 DIAGNOSIS — Z1239 Encounter for other screening for malignant neoplasm of breast: Secondary | ICD-10-CM

## 2012-11-10 NOTE — Telephone Encounter (Signed)
Patient is at [facility] now and they need an order for diagnostic mammography with dx of hx of breast cancer and yearly screening.

## 2012-11-21 ENCOUNTER — Ambulatory Visit: Payer: PRIVATE HEALTH INSURANCE | Admitting: Internal Medicine

## 2012-11-22 ENCOUNTER — Telehealth: Payer: Self-pay | Admitting: Internal Medicine

## 2012-11-22 NOTE — Telephone Encounter (Signed)
Patient requesting refill on xanax. Patient husband had a heart attack and they are in [place].

## 2012-11-22 NOTE — Telephone Encounter (Signed)
Fine to refill xanax, however they will not allow refill of controlled drug over state lines. She may have to go to urgent care in [place] if she is not coming back to [place].

## 2012-11-24 NOTE — Telephone Encounter (Signed)
Patient voicemail is full, could not leave message.

## 2012-11-30 NOTE — Telephone Encounter (Signed)
Patient never returned call  

## 2012-12-19 ENCOUNTER — Telehealth: Payer: Self-pay | Admitting: Internal Medicine

## 2012-12-19 NOTE — Telephone Encounter (Signed)
Pt is needing refill on Xanax she is now back in [place] and uses [facility] on [address].

## 2012-12-20 NOTE — Telephone Encounter (Signed)
Fine to refill 

## 2012-12-20 NOTE — Telephone Encounter (Signed)
Ok to refill 

## 2012-12-21 MED ORDER — ALPRAZOLAM 0.5 MG PO TABS: 0.5000 mg | ORAL_TABLET | Freq: Three times a day (TID) | ORAL | Status: DC | PRN

## 2012-12-21 NOTE — Telephone Encounter (Signed)
Rx printed to be faxed to [facility].

## 2013-01-23 ENCOUNTER — Telehealth: Payer: Self-pay | Admitting: Internal Medicine

## 2013-01-23 DIAGNOSIS — Z1211 Encounter for screening for malignant neoplasm of colon: Secondary | ICD-10-CM

## 2013-01-23 NOTE — Telephone Encounter (Signed)
Pt has had history of breast cancer and is needing to schedule a mammo at [facility] and a colonoscopy.

## 2013-01-23 NOTE — Telephone Encounter (Signed)
Order for mammogram is already in. We will need to figure out who she wants to see for colonoscopy.

## 2013-01-24 NOTE — Telephone Encounter (Signed)
Dr. Mechele Collin is who she wants to do her colonoscopy.

## 2013-01-24 NOTE — Telephone Encounter (Signed)
Left message on patient voicemail to call back with her preference of who she would like to do her colonoscopy.

## 2013-01-24 NOTE — Telephone Encounter (Signed)
Fwd to Dr. Walker 

## 2013-02-20 ENCOUNTER — Encounter: Payer: Self-pay | Admitting: Emergency Medicine

## 2013-03-20 ENCOUNTER — Other Ambulatory Visit: Payer: Self-pay | Admitting: *Deleted

## 2013-03-20 MED ORDER — ALPRAZOLAM 0.5 MG PO TABS: 0.5000 mg | ORAL_TABLET | Freq: Three times a day (TID) | ORAL | Status: DC | PRN

## 2013-04-06 ENCOUNTER — Other Ambulatory Visit: Payer: Self-pay

## 2013-04-19 ENCOUNTER — Other Ambulatory Visit: Payer: Self-pay | Admitting: Internal Medicine

## 2013-04-19 NOTE — Telephone Encounter (Signed)
Last visit 10/19/12, refill?

## 2013-05-03 ENCOUNTER — Other Ambulatory Visit: Payer: Self-pay | Admitting: Internal Medicine

## 2013-05-03 MED ORDER — FLUOXETINE HCL 20 MG PO CAPS: 60.0000 mg | ORAL_CAPSULE | Freq: Every day | ORAL | Status: DC

## 2013-05-03 MED ORDER — LOSARTAN POTASSIUM-HCTZ 100-25 MG PO TABS: 1.0000 | ORAL_TABLET | Freq: Every day | ORAL | Status: DC

## 2013-05-04 ENCOUNTER — Encounter: Payer: Self-pay | Admitting: *Deleted

## 2013-05-04 ENCOUNTER — Telehealth: Payer: Self-pay | Admitting: Internal Medicine

## 2013-05-04 ENCOUNTER — Ambulatory Visit
Admission: RE | Admit: 2013-05-04 | Discharge: 2013-05-04 | Disposition: A | Discharge status: Home / Self Care | Payer: Commercial Managed Care - HMO | Source: Ambulatory Visit | Attending: Internal Medicine | Admitting: Internal Medicine

## 2013-05-04 DIAGNOSIS — Z0000 Encounter for general adult medical examination without abnormal findings: Secondary | ICD-10-CM

## 2013-05-04 LAB — HM MAMMOGRAPHY

## 2013-05-04 NOTE — Telephone Encounter (Signed)
Patient was sent a Mychart message to call your pharmacy, the prescriptions was sent to pharmacy yesterday.

## 2013-05-04 NOTE — Telephone Encounter (Signed)
Pt has been out of losartan and Prozac for several days.  Says she has spoken w/ pharmacy but was told she did not have anymore refills.  [facility] [address]

## 2013-06-01 HISTORY — PX: COLONOSCOPY: SHX174

## 2013-06-22 ENCOUNTER — Telehealth: Payer: Self-pay | Admitting: Emergency Medicine

## 2013-06-22 NOTE — Telephone Encounter (Signed)
Referral for silverback underway for [facility]

## 2013-06-26 ENCOUNTER — Ambulatory Visit: Payer: Self-pay | Admitting: Unknown Physician Specialty

## 2013-06-27 LAB — PATHOLOGY REPORT

## 2013-06-27 LAB — HM COLONOSCOPY

## 2013-07-03 NOTE — Telephone Encounter (Signed)
Pt has been approved to see Dr. Tiffany Kocher with 4 visits, exp 09/21/13. auth # [id number]

## 2013-07-12 ENCOUNTER — Other Ambulatory Visit: Payer: Self-pay | Admitting: Internal Medicine

## 2013-07-12 NOTE — Telephone Encounter (Signed)
Last OV 10/19/12 ok to refill?

## 2013-08-18 ENCOUNTER — Other Ambulatory Visit: Payer: Self-pay | Admitting: Internal Medicine

## 2013-09-22 ENCOUNTER — Telehealth: Payer: Self-pay | Admitting: Internal Medicine

## 2013-09-22 NOTE — Telephone Encounter (Signed)
Pt left vm. States she needs referral to Dr. Gerline Legacy in [facility] for dermatology.  Also needs referral for [facility] for flush.  States she has [employer].

## 2013-09-22 NOTE — Telephone Encounter (Signed)
LVM for pt to return call. She needs to schedule an apt.

## 2013-09-22 NOTE — Telephone Encounter (Signed)
Please advise Dr. Gilford Rile, she has not been seen in almost a year.

## 2013-09-22 NOTE — Telephone Encounter (Signed)
Will need to be seen for referrals.

## 2013-09-26 NOTE — Telephone Encounter (Signed)
Patient appointment was scheduled

## 2013-09-29 ENCOUNTER — Other Ambulatory Visit: Payer: Self-pay | Admitting: Internal Medicine

## 2013-09-29 NOTE — Telephone Encounter (Signed)
Appt 10/25/13

## 2013-10-11 ENCOUNTER — Other Ambulatory Visit: Payer: Self-pay | Admitting: Internal Medicine

## 2013-10-12 NOTE — Telephone Encounter (Signed)
Appt scheduled 10/25/13, refill?

## 2013-10-15 ENCOUNTER — Ambulatory Visit: Payer: Self-pay

## 2013-10-15 LAB — COMPREHENSIVE METABOLIC PANEL
ALK PHOS: 76 U/L
Albumin: 3.1 g/dL — ABNORMAL LOW (ref 3.4–5.0)
Anion Gap: 10 (ref 7–16)
BILIRUBIN TOTAL: 0.3 mg/dL (ref 0.2–1.0)
BUN: 12 mg/dL (ref 7–18)
CREATININE: 1.1 mg/dL (ref 0.60–1.30)
Calcium, Total: 8.8 mg/dL (ref 8.5–10.1)
Chloride: 104 mmol/L (ref 98–107)
Co2: 27 mmol/L (ref 21–32)
GFR CALC NON AF AMER: 52 — AB
Glucose: 120 mg/dL — ABNORMAL HIGH (ref 65–99)
OSMOLALITY: 282 (ref 275–301)
Potassium: 3.6 mmol/L (ref 3.5–5.1)
SGOT(AST): 12 U/L — ABNORMAL LOW (ref 15–37)
SGPT (ALT): 20 U/L (ref 12–78)
Sodium: 141 mmol/L (ref 136–145)
Total Protein: 6.9 g/dL (ref 6.4–8.2)

## 2013-10-15 LAB — CBC WITH DIFFERENTIAL/PLATELET
BASOS PCT: 0.4 %
Basophil #: 0 10*3/uL (ref 0.0–0.1)
EOS ABS: 0.3 10*3/uL (ref 0.0–0.7)
Eosinophil %: 2.1 %
HCT: 34.6 % — AB (ref 35.0–47.0)
HGB: 11.5 g/dL — ABNORMAL LOW (ref 12.0–16.0)
LYMPHS PCT: 12.9 %
Lymphocyte #: 1.6 10*3/uL (ref 1.0–3.6)
MCH: 32.1 pg (ref 26.0–34.0)
MCHC: 33.4 g/dL (ref 32.0–36.0)
MCV: 96 fL (ref 80–100)
Monocyte #: 1.2 x10 3/mm — ABNORMAL HIGH (ref 0.2–0.9)
Monocyte %: 9.8 %
NEUTROS PCT: 74.8 %
Neutrophil #: 9.1 10*3/uL — ABNORMAL HIGH (ref 1.4–6.5)
Platelet: 213 10*3/uL (ref 150–440)
RBC: 3.59 10*6/uL — ABNORMAL LOW (ref 3.80–5.20)
RDW: 13.8 % (ref 11.5–14.5)
WBC: 12.2 10*3/uL — ABNORMAL HIGH (ref 3.6–11.0)

## 2013-10-15 LAB — URINALYSIS, COMPLETE
Glucose,UR: NEGATIVE mg/dL (ref 0–75)
Ketone: NEGATIVE
Nitrite: NEGATIVE
Ph: 6 (ref 4.5–8.0)
Specific Gravity: 1.025 (ref 1.003–1.030)

## 2013-10-17 LAB — URINE CULTURE

## 2013-10-19 ENCOUNTER — Telehealth: Payer: Self-pay | Admitting: *Deleted

## 2013-10-19 NOTE — Telephone Encounter (Signed)
Pt called stating that she was having lower abdominal pain, worse when she walked, radiated to her back. She went to the urgent care on Sunday and was diagnosed with a kidney infection, she also believes that she has a vaginal infection as well.  Pt was given a injection of Rocephin and put on cephalexin.  Pt has an appt next week for her annual exam and is requesting a pelvic exam done then.  Pt also states that Cephalexin is causing nausea and requesting that something be called into her pharmacy for this.

## 2013-10-19 NOTE — Telephone Encounter (Signed)
Can you try to get the notes from urgent care? We can call in Ondansetron 8mg  po bid prn nausea #20. However, if she has worsening symptoms of nausea, fever, abdominal pain, then needs to be seen and evaluated again.

## 2013-10-25 ENCOUNTER — Other Ambulatory Visit (HOSPITAL_COMMUNITY)
Admission: RE | Admit: 2013-10-25 | Discharge: 2013-10-25 | Disposition: A | Discharge status: Home / Self Care | Payer: Medicare HMO | Source: Ambulatory Visit | Attending: Internal Medicine | Admitting: Internal Medicine

## 2013-10-25 ENCOUNTER — Encounter: Payer: Self-pay | Admitting: Internal Medicine

## 2013-10-25 ENCOUNTER — Telehealth: Payer: Self-pay | Admitting: *Deleted

## 2013-10-25 ENCOUNTER — Ambulatory Visit (INDEPENDENT_AMBULATORY_CARE_PROVIDER_SITE_OTHER): Payer: Commercial Managed Care - HMO | Admitting: Internal Medicine

## 2013-10-25 VITALS — BP 124/80 | HR 87 | Temp 98.3°F | Ht 64.1 in | Wt 192.0 lb

## 2013-10-25 DIAGNOSIS — Z1151 Encounter for screening for human papillomavirus (HPV): Secondary | ICD-10-CM | POA: Insufficient documentation

## 2013-10-25 DIAGNOSIS — Z124 Encounter for screening for malignant neoplasm of cervix: Secondary | ICD-10-CM | POA: Insufficient documentation

## 2013-10-25 DIAGNOSIS — N8111 Cystocele, midline: Secondary | ICD-10-CM

## 2013-10-25 DIAGNOSIS — I10 Essential (primary) hypertension: Secondary | ICD-10-CM

## 2013-10-25 DIAGNOSIS — Z0000 Encounter for general adult medical examination without abnormal findings: Secondary | ICD-10-CM

## 2013-10-25 DIAGNOSIS — L989 Disorder of the skin and subcutaneous tissue, unspecified: Secondary | ICD-10-CM

## 2013-10-25 DIAGNOSIS — R102 Pelvic and perineal pain: Secondary | ICD-10-CM | POA: Insufficient documentation

## 2013-10-25 DIAGNOSIS — G4730 Sleep apnea, unspecified: Secondary | ICD-10-CM

## 2013-10-25 DIAGNOSIS — Z23 Encounter for immunization: Secondary | ICD-10-CM

## 2013-10-25 DIAGNOSIS — IMO0002 Reserved for concepts with insufficient information to code with codable children: Secondary | ICD-10-CM | POA: Insufficient documentation

## 2013-10-25 LAB — COMPREHENSIVE METABOLIC PANEL
ALBUMIN: 3.8 g/dL (ref 3.5–5.2)
ALT: 19 U/L (ref 0–35)
AST: 23 U/L (ref 0–37)
Alkaline Phosphatase: 63 U/L (ref 39–117)
BILIRUBIN TOTAL: 0.6 mg/dL (ref 0.2–1.2)
BUN: 20 mg/dL (ref 6–23)
CO2: 27 meq/L (ref 19–32)
Calcium: 9.5 mg/dL (ref 8.4–10.5)
Chloride: 100 mEq/L (ref 96–112)
Creatinine, Ser: 0.8 mg/dL (ref 0.4–1.2)
GFR: 74.09 mL/min (ref >60.00)
Glucose, Bld: 99 mg/dL (ref 70–99)
POTASSIUM: 4.3 meq/L (ref 3.5–5.1)
SODIUM: 138 meq/L (ref 135–145)
TOTAL PROTEIN: 7.4 g/dL (ref 6.0–8.3)

## 2013-10-25 LAB — LIPID PANEL
Cholesterol: 254 mg/dL — ABNORMAL HIGH (ref 0–200)
HDL: 51.9 mg/dL (ref >39.00)
LDL CALC: 158 mg/dL — AB (ref 0–99)
Total CHOL/HDL Ratio: 5
Triglycerides: 222 mg/dL — ABNORMAL HIGH (ref 0.0–149.0)
VLDL: 44.4 mg/dL — AB (ref 0.0–40.0)

## 2013-10-25 LAB — POCT URINALYSIS DIPSTICK
Bilirubin, UA: NEGATIVE
Blood, UA: NEGATIVE
Glucose, UA: NEGATIVE
NITRITE UA: NEGATIVE
PROTEIN UA: NEGATIVE
Spec Grav, UA: 1.025
Urobilinogen, UA: 0.2
pH, UA: 6

## 2013-10-25 LAB — CBC WITH DIFFERENTIAL/PLATELET
BASOS ABS: 0.1 10*3/uL (ref 0.0–0.1)
Basophils Relative: 0.8 % (ref 0.0–3.0)
EOS PCT: 3.9 % (ref 0.0–5.0)
Eosinophils Absolute: 0.3 10*3/uL (ref 0.0–0.7)
HEMATOCRIT: 38.1 % (ref 36.0–46.0)
Hemoglobin: 12.7 g/dL (ref 12.0–15.0)
LYMPHS ABS: 2.2 10*3/uL (ref 0.7–4.0)
Lymphocytes Relative: 27 % (ref 12.0–46.0)
MCHC: 33.3 g/dL (ref 30.0–36.0)
MCV: 95.6 fl (ref 78.0–100.0)
Monocytes Absolute: 0.7 10*3/uL (ref 0.1–1.0)
Monocytes Relative: 8.2 % (ref 3.0–12.0)
Neutro Abs: 4.8 10*3/uL (ref 1.4–7.7)
Neutrophils Relative %: 60.1 % (ref 43.0–77.0)
PLATELETS: 300 10*3/uL (ref 150.0–400.0)
RBC: 3.99 Mil/uL (ref 3.87–5.11)
RDW: 13.6 % (ref 11.5–15.5)
WBC: 8 10*3/uL (ref 4.0–10.5)

## 2013-10-25 MED ORDER — ALPRAZOLAM 0.5 MG PO TABS: ORAL_TABLET | ORAL | Status: DC

## 2013-10-25 MED ORDER — LOSARTAN POTASSIUM-HCTZ 100-25 MG PO TABS: 1.0000 | ORAL_TABLET | Freq: Every day | ORAL | Status: DC

## 2013-10-25 NOTE — Assessment & Plan Note (Signed)
Recent pelvic pain in setting of UTI. Exam normal today except as noted. Will repeat UA today. Will set up GYN evaluation for chronic cystocele, as this may be contributing. Will also set up pelvic US.

## 2013-10-25 NOTE — Progress Notes (Signed)
Subjective:    Patient ID: Dana Obrien, female    DOB: 06-Jul-1945, 68 y.o.   MRN: [id number]  HPI The patient is here for annual Medicare wellness examination and management of other chronic and acute problems.   The risk factors are reflected in the social history.  The roster of all physicians providing medical care to patient - is listed in the Snapshot section of the chart.  Activities of daily living:  The patient is 100% independent in all ADLs: dressing, toileting, feeding as well as independent mobility  Home safety : The patient has smoke detectors in the home. They wear seatbelts.  There are no firearms at home. There is no violence in the home.   There is no risks for hepatitis, STDs or HIV. There is no history of blood transfusion. They have no travel history to infectious disease endemic areas of the world.  The patient has seen their dentist in the last six month. Dentist - Dr. Carman Ching  They have seen their eye doctor in the last year. 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 Wears hearing aids, last changed 1 month ago. [facility]  They do not  have excessive sun exposure. Discussed the need for sun protection: hats, long sleeves and use of sunscreen if there is significant sun exposure. Dermatologist - Dr. Sharlett Iles in the past  Diet: the importance of a healthy diet is discussed. They do have a relatively healthy diet.  The benefits of regular aerobic exercise were discussed. She walks typically daily.  Depression screen: there are no signs or vegative symptoms of depression- irritability, change in appetite, anhedonia, sadness/tearfullness.  Cognitive assessment: the patient manages all their financial and personal affairs and is actively engaged. They could relate day,date,year and events.  The following portions of the patient's history were reviewed and updated as appropriate: allergies, current medications,  past family history, past medical history,  past surgical history, past social history  and problem list.  Visual acuity was not assessed per patient preference since she has regular follow up with her ophthalmologist. Hearing and body mass index were assessed and reviewed.   During the course of the visit the patient was educated and counseled about appropriate screening and preventive services including : fall prevention , diabetes screening, nutrition counseling, colorectal cancer screening, and recommended immunizations.    Last week had abdominal pain, radiating from low abdomen to back. Was seen at urgent care. Started on Pyridium and given IM injection of Rocephin and oral Ceftin for UTI. Had fever and chills at that time. Also had nausea. Now, continues to have lower pelvic pain, much less intense. Concerned about cystocele and would like this further evaluated by GYN  Also concerned about recent witnessed snoring and apnea during sleep. Would like to set up a sleep study.   Review of Systems  Constitutional: Negative for fever, chills, appetite change, fatigue and unexpected weight change.  HENT: Negative for congestion, ear pain, sinus pressure, sore throat, trouble swallowing and voice change.   Eyes: Negative for visual disturbance.  Respiratory: Negative for cough, shortness of breath, wheezing and stridor.   Cardiovascular: Negative for chest pain, palpitations and leg swelling.  Gastrointestinal: Negative for nausea, vomiting, abdominal pain, diarrhea, constipation, blood in stool, abdominal distention and anal bleeding.  Genitourinary: Positive for pelvic pain. Negative for dysuria, urgency, frequency, flank pain, vaginal bleeding, vaginal discharge, genital sores and vaginal pain.  Musculoskeletal: Negative for arthralgias,  gait problem, myalgias and neck pain.  Skin: Negative for color change and rash.  Neurological: Negative for dizziness and headaches.  Hematological:  Negative for adenopathy. Does not bruise/bleed easily.  Psychiatric/Behavioral: Negative for suicidal ideas, sleep disturbance and dysphoric mood. The patient is not nervous/anxious.    BP 124/80  Pulse 87  Temp(Src) 98.3 F (36.8 C) (Oral)  Ht 5' 4.1" (1.628 m)  Wt 192 lb (87.091 kg)  BMI 32.86 kg/m2  SpO2 96%     Objective:   Physical Exam  Constitutional: She is oriented to person, place, and time. She appears well-developed and well-nourished. No distress.  HENT:  Head: Normocephalic and atraumatic.  Right Ear: External ear normal.  Left Ear: External ear normal.  Nose: Nose normal.  Mouth/Throat: Oropharynx is clear and moist. No oropharyngeal exudate.  Eyes: Conjunctivae are normal. Pupils are equal, round, and reactive to light. Right eye exhibits no discharge. Left eye exhibits no discharge. No scleral icterus.  Neck: Normal range of motion. Neck supple. No tracheal deviation present. No thyromegaly present.  Cardiovascular: Normal rate, regular rhythm, normal heart sounds and intact distal pulses.  Exam reveals no gallop and no friction rub.   No murmur heard. Pulmonary/Chest: Effort normal and breath sounds normal. No respiratory distress. She has no wheezes. She has no rales. She exhibits no tenderness.  Abdominal: Soft. Bowel sounds are normal. She exhibits no distension and no mass. There is no tenderness. There is no rebound and no guarding.  Genitourinary: Rectum normal, vagina normal and uterus normal. No breast swelling, tenderness, discharge or bleeding. Pelvic exam was performed with patient supine. There is no rash, tenderness or lesion on the right labia. There is no rash, tenderness or lesion on the left labia. Uterus is not enlarged and not tender. Cervix exhibits no motion tenderness, no discharge and no friability. Right adnexum displays no mass, no tenderness and no fullness. Left adnexum displays no mass, no tenderness and no fullness. No erythema or tenderness  around the vagina. No vaginal discharge found.  Difficult to visualize cervix as pt has large cystocele and rectocele  Musculoskeletal: Normal range of motion. She exhibits no edema and no tenderness.  Lymphadenopathy:    She has no cervical adenopathy.  Neurological: She is alert and oriented to person, place, and time. No cranial nerve deficit. She exhibits normal muscle tone. Coordination normal.  Skin: Skin is warm and dry. No rash noted. She is not diaphoretic. No erythema. No pallor.  Psychiatric: She has a normal mood and affect. Her behavior is normal. Judgment and thought content normal.          Assessment & Plan:

## 2013-10-25 NOTE — Assessment & Plan Note (Signed)
Pt having episodes of apnea during sleep. Will set up sleep study for further evaluation.

## 2013-10-25 NOTE — Assessment & Plan Note (Signed)
Cystocele and rectocele noted on exam today. Will set up GYN evaluation.

## 2013-10-25 NOTE — Telephone Encounter (Signed)
Can you please ask pt to return to submit another sample?

## 2013-10-25 NOTE — Assessment & Plan Note (Signed)
BP Readings from Last 3 Encounters:  10/25/13 124/80  10/19/12 130/88  06/30/12 143/74   BP well controlled on current medication. Will check renal function with labs today.

## 2013-10-25 NOTE — Assessment & Plan Note (Signed)
General medical exam including breast and pelvic exam normal except as noted. Mammogram reviewed and UTD. PAP pending. Colonoscopy UTD. Immunizations UTD except for Prevnar, given today. Encouraged healthy diet and exercise. Appropriate screening performed.

## 2013-10-25 NOTE — Assessment & Plan Note (Signed)
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 Will schedule follow up with dermatology.

## 2013-10-25 NOTE — Telephone Encounter (Signed)
Lab called they need another urine sample, the urine opened in the bag, a culture couldn't be done

## 2013-10-25 NOTE — Progress Notes (Signed)
Pre visit review using our clinic review tool, if applicable. No additional management support is needed unless otherwise documented below in the visit note. 

## 2013-10-25 NOTE — Addendum Note (Signed)
Addended by: Karlene Einstein D on: 10/25/2013 12:24 PM   Modules accepted: Orders

## 2013-10-26 ENCOUNTER — Telehealth: Payer: Self-pay | Admitting: Internal Medicine

## 2013-10-26 LAB — VITAMIN D 25 HYDROXY (VIT D DEFICIENCY, FRACTURES): Vit D, 25-Hydroxy: 20 ng/mL — ABNORMAL LOW (ref 30–89)

## 2013-10-26 NOTE — Telephone Encounter (Signed)
Pt will be coming by the office this morning to give [place] a urine sample

## 2013-10-26 NOTE — Telephone Encounter (Signed)
Relevant patient education mailed to patient.  

## 2013-10-27 ENCOUNTER — Other Ambulatory Visit: Payer: Self-pay | Admitting: Internal Medicine

## 2013-10-27 ENCOUNTER — Other Ambulatory Visit: Payer: Commercial Managed Care - HMO | Admitting: *Deleted

## 2013-10-27 DIAGNOSIS — R102 Pelvic and perineal pain: Secondary | ICD-10-CM

## 2013-10-27 DIAGNOSIS — Z139 Encounter for screening, unspecified: Secondary | ICD-10-CM

## 2013-10-27 LAB — MICROALBUMIN / CREATININE URINE RATIO
Creatinine,U: 146.5 mg/dL
Microalb Creat Ratio: 0.7 mg/g (ref 0.0–30.0)
Microalb, Ur: 1 mg/dL (ref 0.0–1.9)

## 2013-10-30 ENCOUNTER — Ambulatory Visit: Payer: Self-pay | Admitting: Internal Medicine

## 2013-10-30 LAB — URINE CULTURE: Colony Count: 40000

## 2013-10-31 ENCOUNTER — Telehealth: Payer: Self-pay | Admitting: Internal Medicine

## 2013-10-31 NOTE — Telephone Encounter (Signed)
Left vm for pt to return my call.  

## 2013-10-31 NOTE — Telephone Encounter (Signed)
Pelvic ultrasound was normal.

## 2013-11-01 NOTE — Telephone Encounter (Signed)
Sent mychart message with results

## 2013-11-03 NOTE — Telephone Encounter (Signed)
Mailed unread message to pt  

## 2013-11-06 ENCOUNTER — Other Ambulatory Visit: Payer: Self-pay | Admitting: *Deleted

## 2013-11-06 ENCOUNTER — Telehealth: Payer: Self-pay | Admitting: Internal Medicine

## 2013-11-06 MED ORDER — FLUOXETINE HCL 20 MG PO CAPS: 60.0000 mg | ORAL_CAPSULE | Freq: Every day | ORAL | Status: DC

## 2013-11-06 NOTE — Telephone Encounter (Signed)
Rx sent 

## 2013-11-06 NOTE — Telephone Encounter (Signed)
Patients' husband stopped by to drop off rx for his wife to be refilled. Rx is Fluoxetine 20mg . Please advise pt when rx is ready/msn

## 2013-11-16 ENCOUNTER — Telehealth: Payer: Self-pay | Admitting: Internal Medicine

## 2013-11-16 NOTE — Telephone Encounter (Signed)
I would recommend Lonia Blood at [facility].

## 2013-11-16 NOTE — Telephone Encounter (Signed)
Pt left vm.  States she saw Dr. Gilford Rile and was referred to urogyn at [facility].  States Dr. Gilford Rile recommended a specific doctor there and she wants to be sure that she is seeing that doctor. States they are letting her see a doctor today, but she does not think it is the one Dr. Gilford Rile recommended.  Asking for a call.

## 2013-11-16 NOTE — Telephone Encounter (Signed)
LM on vm requesting pt to return my call

## 2013-11-17 DIAGNOSIS — N816 Rectocele: Secondary | ICD-10-CM | POA: Insufficient documentation

## 2013-11-17 DIAGNOSIS — R350 Frequency of micturition: Secondary | ICD-10-CM | POA: Insufficient documentation

## 2013-11-17 NOTE — Telephone Encounter (Signed)
Notified pt. 

## 2013-11-19 ENCOUNTER — Encounter: Payer: Self-pay | Admitting: Internal Medicine

## 2013-11-20 ENCOUNTER — Emergency Department: Payer: Self-pay | Admitting: Emergency Medicine

## 2013-11-20 ENCOUNTER — Encounter: Payer: Self-pay | Admitting: Internal Medicine

## 2013-11-20 ENCOUNTER — Telehealth: Payer: Self-pay | Admitting: Internal Medicine

## 2013-11-20 LAB — URINALYSIS, COMPLETE
BILIRUBIN, UR: NEGATIVE
Bacteria: NONE SEEN
Blood: NEGATIVE
GLUCOSE, UR: NEGATIVE mg/dL (ref 0–75)
Ketone: NEGATIVE
Nitrite: NEGATIVE
PROTEIN: NEGATIVE
Ph: 5 (ref 4.5–8.0)
RBC,UR: 2 /HPF (ref 0–5)
SPECIFIC GRAVITY: 1.023 (ref 1.003–1.030)
Squamous Epithelial: 4
WBC UR: 7 /HPF (ref 0–5)

## 2013-11-20 LAB — CBC WITH DIFFERENTIAL/PLATELET
Basophil #: 0.1 10*3/uL (ref 0.0–0.1)
Basophil %: 0.8 %
EOS ABS: 0.3 10*3/uL (ref 0.0–0.7)
Eosinophil %: 3.4 %
HCT: 39.7 % (ref 35.0–47.0)
HGB: 13.2 g/dL (ref 12.0–16.0)
LYMPHS PCT: 23.8 %
Lymphocyte #: 2.1 10*3/uL (ref 1.0–3.6)
MCH: 32.1 pg (ref 26.0–34.0)
MCHC: 33.2 g/dL (ref 32.0–36.0)
MCV: 97 fL (ref 80–100)
MONOS PCT: 7.3 %
Monocyte #: 0.7 x10 3/mm (ref 0.2–0.9)
Neutrophil #: 5.8 10*3/uL (ref 1.4–6.5)
Neutrophil %: 64.7 %
Platelet: 222 10*3/uL (ref 150–440)
RBC: 4.1 10*6/uL (ref 3.80–5.20)
RDW: 13.7 % (ref 11.5–14.5)
WBC: 9 10*3/uL (ref 3.6–11.0)

## 2013-11-20 LAB — COMPREHENSIVE METABOLIC PANEL
ALK PHOS: 69 U/L
ANION GAP: 2 — AB (ref 7–16)
Albumin: 3.6 g/dL (ref 3.4–5.0)
BILIRUBIN TOTAL: 0.2 mg/dL (ref 0.2–1.0)
BUN: 20 mg/dL — ABNORMAL HIGH (ref 7–18)
Calcium, Total: 9.2 mg/dL (ref 8.5–10.1)
Chloride: 105 mmol/L (ref 98–107)
Co2: 32 mmol/L (ref 21–32)
Creatinine: 0.75 mg/dL (ref 0.60–1.30)
EGFR (Non-African Amer.): >60
Glucose: 104 mg/dL — ABNORMAL HIGH (ref 65–99)
Osmolality: 280 (ref 275–301)
POTASSIUM: 3.9 mmol/L (ref 3.5–5.1)
SGOT(AST): 22 U/L (ref 15–37)
SGPT (ALT): 19 U/L (ref 12–78)
SODIUM: 139 mmol/L (ref 136–145)
TOTAL PROTEIN: 7.5 g/dL (ref 6.4–8.2)

## 2013-11-20 LAB — LIPASE, BLOOD: LIPASE: 196 U/L (ref 73–393)

## 2013-11-20 NOTE — Telephone Encounter (Signed)
We should try to work her in at [facility] with another provider this week, or at [facility] if needed.

## 2013-11-20 NOTE — Telephone Encounter (Signed)
Patient Information:  Caller Name: [name]  Phone: [phone]  Patient: Dana Obrien, Dana Obrien  Gender: Female  DOB: 05-07-1946  Age: 68 Years  PCP: Ronette Deter (Adults only)  Office Follow Up:  Does the office need to follow up with this patient?: Yes  Instructions For The Office: No appts. available at [facility]. Patient is requesting to be seen by Dr. Gilford Rile and declines an appt. at another office location. Please return call to patient at [phone] regarding urgent appt. need.  RN Note:  Patient states she has been experiencing pain in the right pelvic area radiating to groin and right flank. Onset 10/18/13. Patient states she was seen in the office 10/25/13. Patient states she had a negative pelvic untrasound. Patient states she has a prolapsed uterus, bladder and rectum. Patient states she was seen by Urogynecology 11/16/13. Patient states she is scheduled for a Cystoscopy 12/05/13. Patient states pain has become progressively worse. Patient states she is unable to tolerate her normal activities. Patient states she has been taking Ibuprofen 800mg . q 12 hours and using a heating pad without relief. Patient is afebrile. Patient denies pain or burning with urination. Patient states pain is currently at a "7" on 1-10 scale. Patient complains of intermittent nausea, no vomiting. No diarrhea. Last bowel movement 11/20/13, soft, brown bowel movement. Patient is requesting an appt.  Care advice given per guidelines. Call back parameters reviewed. Patient verbalizes understanding. No appts. available at [facility]. Patient is requesting to be seen by Dr. Gilford Rile and declines an appt. at another office location. Please return call to patient at [phone] regarding urgent appt. need. Above information sent to office with high priority.   Symptoms  Reason For Call & Symptoms: Abdominal pain  Reviewed Health History In EMR: Yes  Reviewed Medications In  EMR: Yes  Reviewed Allergies In EMR: Yes  Reviewed Surgeries / Procedures: Yes  Date of Onset of Symptoms: 10/18/2013  Treatments Tried: Ibuprofen, heating pad  Treatments Tried Worked: No  Guideline(s) Used:  Abdominal Pain - Female  Disposition Per Guideline:   Go to ED Now (or to Office with PCP Approval)  Reason For Disposition Reached:   Constant abdominal pain lasting > 2 hours  Advice Given:  Fluids:  Sip clear fluids only (e.g., water, flat soft drinks or 1/2 strength fruit juice) until the pain has been gone for over 2 hours. Then slowly return to a regular diet.  Call Back If:  You become worse.  Patient Will Follow Care Advice:  YES

## 2013-11-20 NOTE — Telephone Encounter (Signed)
pts daughter called requesting status of appointment at [facility].  She was advised again there were no appointments at office.  She further stated she was told they would be called back with appointment today.

## 2013-11-21 NOTE — Telephone Encounter (Signed)
I will have to see her to place a referral to GI. Please schedule 67min visit.

## 2013-11-21 NOTE — Telephone Encounter (Signed)
Spoke with pt and she states she went to [facility] ER on 6.22.15 and was told to see Dr Vira Agar in GI.  Pt states she will need a referral from her PCP.  Please advise

## 2013-11-22 NOTE — Telephone Encounter (Signed)
Left detailed message on pts VM to return call to [facility] to schedule appointment if she needs a referral from PCP.

## 2013-11-29 ENCOUNTER — Encounter: Payer: Self-pay | Admitting: Internal Medicine

## 2013-11-29 ENCOUNTER — Ambulatory Visit (INDEPENDENT_AMBULATORY_CARE_PROVIDER_SITE_OTHER): Payer: Commercial Managed Care - HMO | Admitting: Internal Medicine

## 2013-11-29 VITALS — BP 110/88 | HR 87 | Temp 98.2°F | Ht 64.1 in | Wt 189.5 lb

## 2013-11-29 DIAGNOSIS — B029 Zoster without complications: Secondary | ICD-10-CM | POA: Insufficient documentation

## 2013-11-29 MED ORDER — OXYCODONE-ACETAMINOPHEN 5-325 MG PO TABS: 1.0000 | ORAL_TABLET | ORAL | Status: DC | PRN

## 2013-11-29 MED ORDER — GABAPENTIN 100 MG PO CAPS: 100.0000 mg | ORAL_CAPSULE | Freq: Three times a day (TID) | ORAL | Status: DC

## 2013-11-29 MED ORDER — ONDANSETRON HCL 8 MG PO TABS: 8.0000 mg | ORAL_TABLET | Freq: Three times a day (TID) | ORAL | Status: DC | PRN

## 2013-11-29 NOTE — Progress Notes (Signed)
   Subjective:    Patient ID: Dana Obrien, female    DOB: Aug 06, 1945, 68 y.o.   MRN: [id number]  HPI 68YO female presents for acute visit.  Shingles - Rriday night developed rash on right lower abdomen. Had abdominal pain before rash.Diagnosed as having shingles.at Urgent Care on Saturday. Started Valtrex and Tramadol. Pain is severe, burning pain. No improvement with tramadol. Unable to sleep.  Review of Systems  Constitutional: Positive for fatigue. Negative for fever, chills, appetite change and unexpected weight change.  Eyes: Negative for visual disturbance.  Respiratory: Negative for shortness of breath.   Cardiovascular: Negative for chest pain and leg swelling.  Gastrointestinal: Positive for abdominal pain (right lower at site of rash). Negative for nausea, vomiting, diarrhea, constipation and blood in stool.  Skin: Positive for color change and rash.  Hematological: Negative for adenopathy. Does not bruise/bleed easily.  Psychiatric/Behavioral: Positive for sleep disturbance. Negative for dysphoric mood. The patient is not nervous/anxious.        Objective:    BP 110/88  Pulse 87  Temp(Src) 98.2 F (36.8 C) (Oral)  Ht 5' 4.1" (1.628 m)  Wt 189 lb 8 oz (85.957 kg)  BMI 32.43 kg/m2  SpO2 99% Physical Exam  Constitutional: She is oriented to person, place, and time. She appears well-developed and well-nourished. No distress.  HENT:  Head: Normocephalic and atraumatic.  Right Ear: External ear normal.  Left Ear: External ear normal.  Nose: Nose normal.  Mouth/Throat: Oropharynx is clear and moist.  Eyes: Conjunctivae are normal. Pupils are equal, round, and reactive to light. Right eye exhibits no discharge. Left eye exhibits no discharge. No scleral icterus.  Neck: Normal range of motion. Neck supple. No tracheal deviation present. No thyromegaly present.  Pulmonary/Chest: Effort normal. No accessory muscle usage. Not tachypneic. She has no decreased breath  sounds. She has no rhonchi.  Abdominal:    Musculoskeletal: Normal range of motion. She exhibits no edema and no tenderness.  Lymphadenopathy:    She has no cervical adenopathy.  Neurological: She is alert and oriented to person, place, and time. No cranial nerve deficit. She exhibits normal muscle tone. Coordination normal.  Skin: Skin is warm and dry. Rash noted. Rash is vesicular. She is not diaphoretic. No erythema. No pallor.  Psychiatric: She has a normal mood and affect. Her behavior is normal. Judgment and thought content normal.          Assessment & Plan:   Problem List Items Addressed This Visit     Unprioritized   Shingles - Primary     Exam is c/w shingles. Continue Valtrex as prescribed at urgent care. Will add Neurontin 100mg  po tid and prn percocet for severe pain. Pt will email with update and follow up in 2-4 weeks.    Relevant Medications      gabapentin (NEURONTIN) capsule      ondansetron (ZOFRAN) tablet      oxyCODONE-acetaminophen (PERCOCET) 5-325 MG per tablet       Return in about 4 weeks (around 12/27/2013) for Recheck.

## 2013-11-29 NOTE — Progress Notes (Signed)
Pre visit review using our clinic review tool, if applicable. No additional management support is needed unless otherwise documented below in the visit note. 

## 2013-11-29 NOTE — Patient Instructions (Addendum)
Email with update tomorrow.  Start Neurontin 100mg  three times daily and Percocet as needed for severe pain. Use Zofran as needed for nausea.  Follow up in 2-4 weeks.

## 2013-11-29 NOTE — Assessment & Plan Note (Signed)
Exam is c/w shingles. Continue Valtrex as prescribed at urgent care. Will add Neurontin 100mg  po tid and prn percocet for severe pain. Pt will email with update and follow up in 2-4 weeks.

## 2013-12-04 ENCOUNTER — Encounter: Payer: Self-pay | Admitting: Internal Medicine

## 2013-12-11 ENCOUNTER — Encounter: Payer: Self-pay | Admitting: Internal Medicine

## 2013-12-19 ENCOUNTER — Encounter: Payer: Self-pay | Admitting: Internal Medicine

## 2013-12-19 DIAGNOSIS — B029 Zoster without complications: Secondary | ICD-10-CM

## 2013-12-19 MED ORDER — GABAPENTIN 100 MG PO CAPS: 200.0000 mg | ORAL_CAPSULE | Freq: Three times a day (TID) | ORAL | Status: DC

## 2014-01-03 ENCOUNTER — Other Ambulatory Visit: Payer: Self-pay | Admitting: Internal Medicine

## 2014-01-03 MED ORDER — ALPRAZOLAM 0.5 MG PO TABS: ORAL_TABLET | ORAL | Status: DC

## 2014-01-03 NOTE — Telephone Encounter (Signed)
Ok refill? 

## 2014-01-03 NOTE — Telephone Encounter (Signed)
Rx faxed to pharmacy  

## 2014-01-23 ENCOUNTER — Other Ambulatory Visit: Payer: Self-pay | Admitting: Internal Medicine

## 2014-01-23 NOTE — Telephone Encounter (Signed)
Last refill 5.1.15, last OV 5.27.15.  Please advise refill.

## 2014-02-19 ENCOUNTER — Encounter: Payer: Self-pay | Admitting: Internal Medicine

## 2014-03-06 ENCOUNTER — Ambulatory Visit (INDEPENDENT_AMBULATORY_CARE_PROVIDER_SITE_OTHER): Payer: Commercial Managed Care - HMO | Admitting: Internal Medicine

## 2014-03-06 ENCOUNTER — Encounter: Payer: Self-pay | Admitting: Internal Medicine

## 2014-03-06 VITALS — BP 142/78 | HR 75 | Temp 97.9°F | Resp 14 | Ht 64.0 in | Wt 199.5 lb

## 2014-03-06 DIAGNOSIS — B0229 Other postherpetic nervous system involvement: Secondary | ICD-10-CM | POA: Insufficient documentation

## 2014-03-06 DIAGNOSIS — I10 Essential (primary) hypertension: Secondary | ICD-10-CM

## 2014-03-06 DIAGNOSIS — Z23 Encounter for immunization: Secondary | ICD-10-CM

## 2014-03-06 MED ORDER — ATORVASTATIN CALCIUM 20 MG PO TABS: 20.0000 mg | ORAL_TABLET | Freq: Every day | ORAL | Status: DC

## 2014-03-06 MED ORDER — FLUOXETINE HCL 20 MG PO CAPS: 60.0000 mg | ORAL_CAPSULE | Freq: Every day | ORAL | Status: DC

## 2014-03-06 MED ORDER — GABAPENTIN 300 MG PO CAPS: 300.0000 mg | ORAL_CAPSULE | Freq: Three times a day (TID) | ORAL | Status: DC

## 2014-03-06 MED ORDER — FLUTICASONE PROPIONATE 50 MCG/ACT NA SUSP: NASAL | Status: DC

## 2014-03-06 MED ORDER — SUMATRIPTAN SUCCINATE 50 MG PO TABS: 50.0000 mg | ORAL_TABLET | ORAL | Status: DC | PRN

## 2014-03-06 MED ORDER — LOSARTAN POTASSIUM-HCTZ 100-25 MG PO TABS: 1.0000 | ORAL_TABLET | Freq: Every day | ORAL | Status: DC

## 2014-03-06 MED ORDER — TETANUS-DIPHTH-ACELL PERTUSSIS 5-2.5-18.5 LF-MCG/0.5 IM SUSP: 0.5000 mL | Freq: Once | INTRAMUSCULAR | Status: DC

## 2014-03-06 MED ORDER — MELOXICAM 7.5 MG PO TABS: 7.5000 mg | ORAL_TABLET | Freq: Every day | ORAL | Status: DC

## 2014-03-06 MED ORDER — HYDROXYZINE HCL 25 MG PO TABS: 25.0000 mg | ORAL_TABLET | Freq: Two times a day (BID) | ORAL | Status: DC | PRN

## 2014-03-06 MED ORDER — ALPRAZOLAM 0.5 MG PO TABS: ORAL_TABLET | ORAL | Status: DC

## 2014-03-06 MED ORDER — ALBUTEROL SULFATE HFA 108 (90 BASE) MCG/ACT IN AERS: 2.0000 | INHALATION_SPRAY | Freq: Four times a day (QID) | RESPIRATORY_TRACT | Status: DC | PRN

## 2014-03-06 MED ORDER — TRETINOIN 0.05 % EX CREA: 1.0000 "application " | TOPICAL_CREAM | Freq: Every day | CUTANEOUS | Status: DC

## 2014-03-06 NOTE — Progress Notes (Signed)
Subjective:    Patient ID: Dana Obrien, female    DOB: 02-11-1946, 68 y.o.   MRN: [id number]  HPI 68YO female presents for follow up of shingles.  Continues to have pain at site of shingles. Also continues to have some red patches of skin in this area. Taking Neurontin 200mg  po tid with minimal improvement in pain. Pain described as burning. Does not wake her from sleep or limit activity.  Notes that it is a stressful time for her. Waiting on birth of her grandson. Stressed about some work/home issues. Compliant with BP meds and has not been checking BP at home. No chest pain, palpitations.  Review of Systems  Constitutional: Negative for fever, chills, appetite change, fatigue and unexpected weight change.  Eyes: Negative for visual disturbance.  Respiratory: Negative for shortness of breath.   Cardiovascular: Negative for chest pain and leg swelling.  Gastrointestinal: Negative for abdominal pain, diarrhea and constipation.  Musculoskeletal: Positive for myalgias.  Skin: Positive for rash. Negative for color change.  Hematological: Negative for adenopathy. Does not bruise/bleed easily.  Psychiatric/Behavioral: Negative for sleep disturbance and dysphoric mood. The patient is nervous/anxious.        Objective:    BP 142/78  Pulse 75  Temp(Src) 97.9 F (36.6 C) (Oral)  Resp 14  Ht 5\' 4"  (1.626 m)  Wt 199 lb 8 oz (90.493 kg)  BMI 34.23 kg/m2  SpO2 97% Physical Exam  Constitutional: She is oriented to person, place, and time. She appears well-developed and well-nourished. No distress.  HENT:  Head: Normocephalic and atraumatic.  Right Ear: External ear normal.  Left Ear: External ear normal.  Nose: Nose normal.  Mouth/Throat: Oropharynx is clear and moist.  Eyes: Conjunctivae are normal. Pupils are equal, round, and reactive to light. Right eye exhibits no discharge. Left eye exhibits no discharge. No scleral icterus.  Neck: Normal range of motion. Neck supple. No  tracheal deviation present. No thyromegaly present.  Cardiovascular: Normal rate, regular rhythm, normal heart sounds and intact distal pulses.  Exam reveals no gallop and no friction rub.   No murmur heard. Pulmonary/Chest: Effort normal and breath sounds normal. No accessory muscle usage. Not tachypneic. No respiratory distress. She has no decreased breath sounds. She has no wheezes. She has no rhonchi. She has no rales. She exhibits no tenderness.  Musculoskeletal: Normal range of motion. She exhibits no edema and no tenderness.  Lymphadenopathy:    She has no cervical adenopathy.  Neurological: She is alert and oriented to person, place, and time. No cranial nerve deficit. She exhibits normal muscle tone. Coordination normal.  Skin: Skin is warm and dry. Rash noted. Rash is macular. She is not diaphoretic. There is erythema. No pallor.     Psychiatric: She has a normal mood and affect. Her behavior is normal. Judgment and thought content normal.          Assessment & Plan:   Problem List Items Addressed This Visit     Unprioritized   Hypertension      BP Readings from Last 3 Encounters:  03/06/14 142/78  11/29/13 110/88  10/25/13 124/80   BP slightly elevated today. For now, will continue current medications. Follow up for recheck in 4 weeks.    Relevant Medications      atorvastatin (LIPITOR) tablet      losartan-hydrochlorothiazide (HYZAAR) 100-25 MG per tablet   Post herpetic neuralgia - Primary     Symptoms consistent with post-herpetic neuralgia. Will increase neurontin to  300mg  po tid, and plan to titrate for better pain control over next few weeks. Pt will call or email with updates. Follow up for recheck in 4 weeks and prn.    Relevant Medications      gabapentin (NEURONTIN) capsule      ALPRAZolam (XANAX) tablet      FLUoxetine (PROZAC) capsule      hydrOXYzine (ATARAX/VISTARIL) tablet    Other Visit Diagnoses   Need for prophylactic vaccination and  inoculation against influenza        Relevant Orders       Flu Vaccine QUAD 36+ mos PF IM (Fluarix Quad PF) (Completed)        Return in about 4 weeks (around 04/03/2014).

## 2014-03-06 NOTE — Assessment & Plan Note (Signed)
BP Readings from Last 3 Encounters:  03/06/14 142/78  11/29/13 110/88  10/25/13 124/80   BP slightly elevated today. For now, will continue current medications. Follow up for recheck in 4 weeks.

## 2014-03-06 NOTE — Patient Instructions (Signed)
Increase neurontin to 300mg  three times daily.  Follow up in 4 weeks.

## 2014-03-06 NOTE — Assessment & Plan Note (Signed)
Symptoms consistent with post-herpetic neuralgia. Will increase neurontin to 300mg  po tid, and plan to titrate for better pain control over next few weeks. Pt will call or email with updates. Follow up for recheck in 4 weeks and prn.

## 2014-03-06 NOTE — Progress Notes (Signed)
Pre visit review using our clinic review tool, if applicable. No additional management support is needed unless otherwise documented below in the visit note. 

## 2014-03-07 ENCOUNTER — Telehealth: Payer: Self-pay | Admitting: Internal Medicine

## 2014-03-07 ENCOUNTER — Encounter: Payer: Self-pay | Admitting: *Deleted

## 2014-03-07 NOTE — Telephone Encounter (Signed)
emmi emailed °

## 2014-03-08 ENCOUNTER — Telehealth: Payer: Self-pay | Admitting: Internal Medicine

## 2014-03-08 NOTE — Telephone Encounter (Signed)
Yes, she showed me this bite, as she was leaving from her visit. Given increasing redness, we should start Doxycycline 100mg  po bid x 7 days. And set up a recheck next week. She should be evaluated immediately if any fever, chills, headache.

## 2014-03-08 NOTE — Telephone Encounter (Addendum)
Left message for pt to return my call.  Also, see mychart message I sent patient. Need answer to PA question

## 2014-03-08 NOTE — Telephone Encounter (Signed)
Tic bite getting worse circle around bite getting more red and larger , feeling like a hard knot . Patient instructed to call back if the bite got worse.

## 2014-03-09 MED ORDER — DOXYCYCLINE HYCLATE 100 MG PO TABS: 100.0000 mg | ORAL_TABLET | Freq: Two times a day (BID) | ORAL | Status: DC

## 2014-03-09 NOTE — Telephone Encounter (Signed)
PA completed online for Retin-A, approved.

## 2014-03-09 NOTE — Telephone Encounter (Signed)
Pt notified and verbalized understanding. Will be out of town next week, will call to schedule follow up appt. Rx sent to pharmacy by escript. Taking Retin A for roseacea.

## 2014-04-16 ENCOUNTER — Ambulatory Visit: Payer: Medicare Other | Admitting: Internal Medicine

## 2014-07-24 ENCOUNTER — Other Ambulatory Visit: Payer: Self-pay | Admitting: Internal Medicine

## 2014-07-24 NOTE — Telephone Encounter (Signed)
Last OV Oct 6, Okay to refill?

## 2014-08-09 ENCOUNTER — Encounter: Payer: Self-pay | Admitting: Nurse Practitioner

## 2014-08-09 ENCOUNTER — Ambulatory Visit (INDEPENDENT_AMBULATORY_CARE_PROVIDER_SITE_OTHER): Payer: Commercial Managed Care - HMO | Admitting: Nurse Practitioner

## 2014-08-09 VITALS — BP 126/80 | HR 83 | Temp 98.4°F | Resp 14 | Ht 64.1 in | Wt 204.4 lb

## 2014-08-09 DIAGNOSIS — J019 Acute sinusitis, unspecified: Secondary | ICD-10-CM | POA: Insufficient documentation

## 2014-08-09 DIAGNOSIS — J0140 Acute pansinusitis, unspecified: Secondary | ICD-10-CM

## 2014-08-09 MED ORDER — HYDROCODONE-HOMATROPINE 5-1.5 MG/5ML PO SYRP: 5.0000 mL | ORAL_SOLUTION | Freq: Three times a day (TID) | ORAL | Status: DC | PRN

## 2014-08-09 MED ORDER — AZITHROMYCIN 250 MG PO TABS: ORAL_TABLET | ORAL | Status: DC

## 2014-08-09 NOTE — Patient Instructions (Addendum)
Mucinex plain over the counter  Tyelnol as needed   Z-pack as directed  Please take a probiotic ( Align, Floraque or Culturelle) while you are on the antibiotic to prevent a serious antibiotic associated diarrhea  Called clostirudium dificile colitis and a vaginal yeast infection.   Syrup at night time 1 teaspoon - do not take any other narcotics within a few hours of this.   Call us if not improved in 5 days.

## 2014-08-09 NOTE — Addendum Note (Signed)
Addended by: Rubbie Battiest on: 08/09/2014 11:42 AM   Modules accepted: Level of Service

## 2014-08-09 NOTE — Progress Notes (Signed)
Subjective:    Patient ID: Dana Obrien, female    DOB: [birth year], 69 y.o.   MRN: [id number]  HPI  Dana Obrien is a 69 yo female with a CC of facial pain, cough, congestion x 3 days.   1) Nasal congestion, headache, PNdrip, cough  Fevers last night, did not check a temperature, tylenol   Cough at night worse Tried:   Sudafed and tylenol- Not helpful   Coricidin- Not helpful  Grandson 3 months old has RSV   Review of Systems  Constitutional: Negative for fever, chills, diaphoresis and fatigue.  HENT: Positive for congestion, ear pain, postnasal drip, rhinorrhea, sinus pressure and sneezing. Negative for sore throat.   Eyes: Negative for visual disturbance.  Respiratory: Negative for chest tightness, shortness of breath and wheezing.   Cardiovascular: Negative for chest pain, palpitations and leg swelling.  Gastrointestinal: Negative for nausea, vomiting and diarrhea.  Skin: Negative for rash.  Neurological: Positive for headaches. Negative for dizziness, weakness and numbness.  Psychiatric/Behavioral: The patient is not nervous/anxious.    Past Medical History  Diagnosis Date  . Hypertension   . Migraine   . Cancer 2004    right lumpectomy, chemotherapy and radiation Dr. Jeb Levering  . Depression   . Vitamin D deficiency     IN THE PAST    History   Social History  . Marital Status: Legally Separated    Spouse Name: N/A  . Number of Children: N/A  . Years of Education: N/A   Occupational History  . Not on file.   Social History Main Topics  . Smoking status: Never Smoker   . Smokeless tobacco: Never Used  . Alcohol Use: Yes     Comment: rarely  . Drug Use: No  . Sexual Activity: Not on file   Other Topics Concern  . Not on file   Social History Narrative   Daily Caffeine Use:  1-2 coffee   Regular Exercise -  NO    No past surgical history on file.  Family History  Problem Relation Age of Onset  . Cancer Sister     COLON    Allergies    Allergen Reactions  . Niacin And Related     ANTIHYPERLIPEMICS    Current Outpatient Prescriptions on File Prior to Visit  Medication Sig Dispense Refill  . albuterol (PROVENTIL HFA;VENTOLIN HFA) 108 (90 BASE) MCG/ACT inhaler Inhale 2 puffs into the lungs every 6 (six) hours as needed. 1 Inhaler 3  . ALPRAZolam (XANAX) 0.5 MG tablet TAKE ONE TABLET BY MOUTH THREE TIMES DAILY AS NEEDED 90 tablet 0  . atorvastatin (LIPITOR) 20 MG tablet Take 1 tablet (20 mg total) by mouth daily. 90 tablet 1  . buPROPion (WELLBUTRIN XL) 300 MG 24 hr tablet TAKE ONE TABLET BY MOUTH ONCE DAILY 90 tablet 0  . FLUoxetine (PROZAC) 20 MG capsule Take 3 capsules (60 mg total) by mouth daily. 90 capsule 2  . fluticasone (FLONASE) 50 MCG/ACT nasal spray USE TWO SPRAYS IN EACH NOSTRIL ONCE DAILY 16 g 5  . gabapentin (NEURONTIN) 300 MG capsule Take 1 capsule (300 mg total) by mouth 3 (three) times daily. 180 capsule 3  . hydrOXYzine (ATARAX/VISTARIL) 25 MG tablet Take 1 tablet (25 mg total) by mouth 2 (two) times daily as needed. 90 tablet 3  . losartan-hydrochlorothiazide (HYZAAR) 100-25 MG per tablet Take 1 tablet by mouth daily. 90 tablet 1  . meloxicam (MOBIC) 7.5 MG tablet Take 1 tablet (7.5 mg total)  by mouth daily. 90 tablet 1  . Multiple Vitamin (MULTIVITAMIN) capsule Take 1 capsule by mouth daily.    . ondansetron (ZOFRAN) 8 MG tablet Take 1 tablet (8 mg total) by mouth every 8 (eight) hours as needed for nausea or vomiting. 60 tablet 0  . oxyCODONE-acetaminophen (PERCOCET) 5-325 MG per tablet Take 1-2 tablets by mouth every 4 (four) hours as needed for severe pain. 60 tablet 0  . SUMAtriptan (IMITREX) 50 MG tablet Take 1 tablet (50 mg total) by mouth as needed. 10 tablet 3  . Tdap (BOOSTRIX) 5-2.5-18.5 LF-MCG/0.5 injection Inject 0.5 mLs into the muscle once. 0.5 mL 0  . tretinoin (RETIN-A) 0.05 % cream Apply 1 application topically at bedtime. 45 g 0   No current facility-administered medications on file  prior to visit.       Objective:   Physical Exam  Constitutional: She is oriented to person, place, and time. She appears well-developed and well-nourished. No distress.  BP 126/80 mmHg  Pulse 83  Temp(Src) 98.4 F (36.9 C) (Oral)  Resp 14  Ht 5' 4.1" (1.628 m)  Wt 204 lb 6.4 oz (92.715 kg)  BMI 34.98 kg/m2  SpO2 95%   HENT:  Head: Normocephalic and atraumatic.  Right Ear: External ear normal.  Left Ear: External ear normal.  Red oropharynx TM's Clear bilaterally   Eyes: Conjunctivae and EOM are normal. Pupils are equal, round, and reactive to light. Right eye exhibits discharge. Left eye exhibits discharge. No scleral icterus.  Red eyes and watery drainage  Neck: Normal range of motion. Neck supple. No thyromegaly present.  Cardiovascular: Normal rate, regular rhythm, normal heart sounds and intact distal pulses.  Exam reveals no gallop and no friction rub.   No murmur heard. Pulmonary/Chest: Effort normal and breath sounds normal. No respiratory distress. She has no wheezes. She has no rales. She exhibits no tenderness.  Lymphadenopathy:    She has no cervical adenopathy.  Neurological: She is alert and oriented to person, place, and time. No cranial nerve deficit. She exhibits normal muscle tone. Coordination normal.  Skin: Skin is warm and dry. No rash noted. She is not diaphoretic.  Psychiatric: She has a normal mood and affect. Her behavior is normal. Judgment and thought content normal.      Assessment & Plan:

## 2014-08-09 NOTE — Assessment & Plan Note (Signed)
Asked pt to try the mucinex plain OTC and hycodan syrup (Rx given to pt) before starting the Z-pack. Call us if not improved in 5 days. Will follow.

## 2014-08-09 NOTE — Progress Notes (Signed)
Pre visit review using our clinic review tool, if applicable. No additional management support is needed unless otherwise documented below in the visit note. 

## 2014-08-14 ENCOUNTER — Encounter: Payer: Self-pay | Admitting: Internal Medicine

## 2014-08-14 ENCOUNTER — Ambulatory Visit (INDEPENDENT_AMBULATORY_CARE_PROVIDER_SITE_OTHER): Payer: PPO | Admitting: Internal Medicine

## 2014-08-14 ENCOUNTER — Ambulatory Visit (INDEPENDENT_AMBULATORY_CARE_PROVIDER_SITE_OTHER)
Admission: RE | Admit: 2014-08-14 | Discharge: 2014-08-14 | Disposition: A | Discharge status: Home / Self Care | Payer: PPO | Source: Ambulatory Visit | Attending: Internal Medicine | Admitting: Internal Medicine

## 2014-08-14 VITALS — BP 112/71 | HR 86 | Temp 98.1°F | Ht 64.0 in | Wt 199.5 lb

## 2014-08-14 DIAGNOSIS — J069 Acute upper respiratory infection, unspecified: Secondary | ICD-10-CM

## 2014-08-14 DIAGNOSIS — B9789 Other viral agents as the cause of diseases classified elsewhere: Principal | ICD-10-CM

## 2014-08-14 MED ORDER — HYDROCODONE-HOMATROPINE 5-1.5 MG/5ML PO SYRP: 5.0000 mL | ORAL_SOLUTION | Freq: Three times a day (TID) | ORAL | Status: DC | PRN

## 2014-08-14 NOTE — Assessment & Plan Note (Signed)
Symptoms and exam most c/w viral URI with cough. However, given persistent cough will check CXR. Continue Hycodan. Call if fever, dyspnea or other concerns. Recheck in 1 week and prn.

## 2014-08-14 NOTE — Progress Notes (Signed)
Subjective:    Patient ID: Dana Obrien, female    DOB: 07/01/1945, 69 y.o.   MRN: [id number]  HPI  69YO female presents for acute visit.  Last seen 3/10 by Lorane Gell for sinusitis. Started on Azithromycin, Hycodan.  Continues to feel poorly with purulent nasal congestion, post nasal drip. Cough is productive of purulent sputum. Denies chest pain, dyspnea. Grandson who lives with her diagnosed with RSV.   Past medical, surgical, family and social history per today's encounter.  Review of Systems  Constitutional: Positive for fatigue. Negative for fever, chills and unexpected weight change.  HENT: Positive for congestion, postnasal drip, rhinorrhea and sinus pressure. Negative for ear discharge, ear pain, facial swelling, hearing loss, mouth sores, nosebleeds, sneezing, sore throat, tinnitus, trouble swallowing and voice change.   Eyes: Negative for pain, discharge, redness and visual disturbance.  Respiratory: Positive for cough. Negative for chest tightness, shortness of breath, wheezing and stridor.   Cardiovascular: Negative for chest pain, palpitations and leg swelling.  Musculoskeletal: Negative for myalgias, arthralgias, neck pain and neck stiffness.  Skin: Negative for color change and rash.  Neurological: Negative for dizziness, weakness, light-headedness and headaches.  Hematological: Negative for adenopathy.       Objective:    BP 112/71 mmHg  Pulse 86  Temp(Src) 98.1 F (36.7 C) (Oral)  Ht 5\' 4"  (1.626 m)  Wt 199 lb 8 oz (90.493 kg)  BMI 34.23 kg/m2  SpO2 98% Physical Exam  Constitutional: She is oriented to person, place, and time. She appears well-developed and well-nourished. No distress.  HENT:  Head: Normocephalic and atraumatic.  Right Ear: External ear normal.  Left Ear: External ear normal.  Nose: Mucosal edema and rhinorrhea present.  Mouth/Throat: Oropharynx is clear and moist. No oropharyngeal exudate.  Eyes: Conjunctivae are normal.  Pupils are equal, round, and reactive to light. Right eye exhibits no discharge. Left eye exhibits no discharge. No scleral icterus.  Neck: Normal range of motion. Neck supple. No tracheal deviation present. No thyromegaly present.  Cardiovascular: Normal rate, regular rhythm, normal heart sounds and intact distal pulses.  Exam reveals no gallop and no friction rub.   No murmur heard. Pulmonary/Chest: Effort normal and breath sounds normal. No accessory muscle usage. No respiratory distress. She has no decreased breath sounds. She has no wheezes. She has no rhonchi. She has no rales. She exhibits no tenderness.  Musculoskeletal: Normal range of motion. She exhibits no edema or tenderness.  Lymphadenopathy:    She has no cervical adenopathy.  Neurological: She is alert and oriented to person, place, and time. No cranial nerve deficit. She exhibits normal muscle tone. Coordination normal.  Skin: Skin is warm and dry. No rash noted. She is not diaphoretic. No erythema. No pallor.  Psychiatric: She has a normal mood and affect. Her behavior is normal. Judgment and thought content normal.          Assessment & Plan:   Problem List Items Addressed This Visit      Unprioritized   Viral URI with cough - Primary    Symptoms and exam most c/w viral URI with cough. However, given persistent cough will check CXR. Continue Hycodan. Call if fever, dyspnea or other concerns. Recheck in 1 week and prn.      Relevant Medications   HYDROcodone-homatropine (HYCODAN) 5-1.5 MG/5ML syrup   Other Relevant Orders   DG Chest 2 View       Return in about 1 week (around 08/21/2014) for Recheck.

## 2014-08-14 NOTE — Progress Notes (Signed)
Pre visit review using our clinic review tool, if applicable. No additional management support is needed unless otherwise documented below in the visit note. 

## 2014-08-14 NOTE — Patient Instructions (Signed)
Please go to [facility] today for a chest xray. Continue Hycodan for cough.  Call immediately if shortness of breath or fever. Recheck next week.  Upper Respiratory Infection, Adult An upper respiratory infection (URI) is also sometimes known as the common cold. The upper respiratory tract includes the nose, sinuses, throat, trachea, and bronchi. Bronchi are the airways leading to the lungs. Most people improve within 1 week, but symptoms can last up to 2 weeks. A residual cough may last even longer.  CAUSES Many different viruses can infect the tissues lining the upper respiratory tract. The tissues become irritated and inflamed and often become very moist. Mucus production is also common. A cold is contagious. You can easily spread the virus to others by oral contact. This includes kissing, sharing a glass, coughing, or sneezing. Touching your mouth or nose and then touching a surface, which is then touched by another person, can also spread the virus. SYMPTOMS  Symptoms typically develop 1 to 3 days after you come in contact with a cold virus. Symptoms vary from person to person. They may include:  Runny nose.  Sneezing.  Nasal congestion.  Sinus irritation.  Sore throat.  Loss of voice (laryngitis).  Cough.  Fatigue.  Muscle aches.  Loss of appetite.  Headache.  Low-grade fever. DIAGNOSIS  You might diagnose your own cold based on familiar symptoms, since most people get a cold 2 to 3 times a year. Your caregiver can confirm this based on your exam. Most importantly, your caregiver can check that your symptoms are not due to another disease such as strep throat, sinusitis, pneumonia, asthma, or epiglottitis. Blood tests, throat tests, and X-rays are not necessary to diagnose a common cold, but they may sometimes be helpful in excluding other more serious diseases. Your caregiver will decide if any further tests are required. RISKS AND COMPLICATIONS  You may be at risk for  a more severe case of the common cold if you smoke cigarettes, have chronic heart disease (such as heart failure) or lung disease (such as asthma), or if you have a weakened immune system. The very young and very old are also at risk for more serious infections. Bacterial sinusitis, middle ear infections, and bacterial pneumonia can complicate the common cold. The common cold can worsen asthma and chronic obstructive pulmonary disease (COPD). Sometimes, these complications can require emergency medical care and may be life-threatening. PREVENTION  The best way to protect against getting a cold is to practice good hygiene. Avoid oral or hand contact with people with cold symptoms. Wash your hands often if contact occurs. There is no clear evidence that vitamin C, vitamin E, echinacea, or exercise reduces the chance of developing a cold. However, it is always recommended to get plenty of rest and practice good nutrition. TREATMENT  Treatment is directed at relieving symptoms. There is no cure. Antibiotics are not effective, because the infection is caused by a virus, not by bacteria. Treatment may include:  Increased fluid intake. Sports drinks offer valuable electrolytes, sugars, and fluids.  Breathing heated mist or steam (vaporizer or shower).  Eating chicken soup or other clear broths, and maintaining good nutrition.  Getting plenty of rest.  Using gargles or lozenges for comfort.  Controlling fevers with ibuprofen or acetaminophen as directed by your caregiver.  Increasing usage of your inhaler if you have asthma. Zinc gel and zinc lozenges, taken in the first 24 hours of the common cold, can shorten the duration and lessen the severity of  symptoms. Pain medicines may help with fever, muscle aches, and throat pain. A variety of non-prescription medicines are available to treat congestion and runny nose. Your caregiver can make recommendations and may suggest nasal or lung inhalers for other  symptoms.  HOME CARE INSTRUCTIONS   Only take over-the-counter or prescription medicines for pain, discomfort, or fever as directed by your caregiver.  Use a warm mist humidifier or inhale steam from a shower to increase air moisture. This may keep secretions moist and make it easier to breathe.  Drink enough water and fluids to keep your urine clear or pale yellow.  Rest as needed.  Return to work when your temperature has returned to normal or as your caregiver advises. You may need to stay home longer to avoid infecting others. You can also use a face mask and careful hand washing to prevent spread of the virus. SEEK MEDICAL CARE IF:   After the first few days, you feel you are getting worse rather than better.  You need your caregiver's advice about medicines to control symptoms.  You develop chills, worsening shortness of breath, or brown or red sputum. These may be signs of pneumonia.  You develop yellow or brown nasal discharge or pain in the face, especially when you bend forward. These may be signs of sinusitis.  You develop a fever, swollen neck glands, pain with swallowing, or white areas in the back of your throat. These may be signs of strep throat. SEEK IMMEDIATE MEDICAL CARE IF:   You have a fever.  You develop severe or persistent headache, ear pain, sinus pain, or chest pain.  You develop wheezing, a prolonged cough, cough up blood, or have a change in your usual mucus (if you have chronic lung disease).  You develop sore muscles or a stiff neck. Document Released: 11/11/2000 Document Revised: 08/10/2011 Document Reviewed: 08/23/2013 [facility] Patient Information 2015 [place], [place]. This information is not intended to replace advice given to you by your health care provider. Make sure you discuss any questions you have with your health care provider.

## 2014-08-20 ENCOUNTER — Encounter: Payer: Self-pay | Admitting: Internal Medicine

## 2014-08-20 ENCOUNTER — Ambulatory Visit (INDEPENDENT_AMBULATORY_CARE_PROVIDER_SITE_OTHER): Payer: PPO | Admitting: Internal Medicine

## 2014-08-20 VITALS — BP 140/74 | HR 84 | Temp 98.1°F | Ht 64.0 in | Wt 206.5 lb

## 2014-08-20 DIAGNOSIS — B9789 Other viral agents as the cause of diseases classified elsewhere: Principal | ICD-10-CM

## 2014-08-20 DIAGNOSIS — J069 Acute upper respiratory infection, unspecified: Secondary | ICD-10-CM

## 2014-08-20 NOTE — Assessment & Plan Note (Signed)
Symptoms improving. Encouraged continued supportive care with rest, adequate fluids, Hycodan for cough. Follow up prn if symptoms are not improving.

## 2014-08-20 NOTE — Progress Notes (Signed)
   Subjective:    Patient ID: Dana Obrien, female    DOB: 04-19-1946, 69 y.o.   MRN: [id number]  HPI 69YO female presents for follow up.  Last seen 3/15. Diagnosed with viral URI with cough. CXR was normal.  Cough - continues to have productive cough and nasal congestion. No fever, chills, chest pain. Energy level has improved. Using Hycodan as needed with some improvement at night.   Past medical, surgical, family and social history per today's encounter.  Review of Systems  Constitutional: Negative for fever, chills and unexpected weight change.  HENT: Positive for congestion, postnasal drip and rhinorrhea. Negative for ear discharge, ear pain, facial swelling, hearing loss, mouth sores, nosebleeds, sinus pressure, sneezing, sore throat, tinnitus, trouble swallowing and voice change.   Eyes: Negative for pain, discharge, redness and visual disturbance.  Respiratory: Positive for cough. Negative for chest tightness, shortness of breath, wheezing and stridor.   Cardiovascular: Negative for chest pain, palpitations and leg swelling.  Musculoskeletal: Negative for myalgias, arthralgias, neck pain and neck stiffness.  Skin: Negative for color change and rash.  Neurological: Negative for dizziness, weakness, light-headedness and headaches.  Hematological: Negative for adenopathy.       Objective:    BP 140/74 mmHg  Pulse 84  Temp(Src) 98.1 F (36.7 C) (Oral)  Ht 5\' 4"  (1.626 m)  Wt 206 lb 8 oz (93.668 kg)  BMI 35.43 kg/m2  SpO2 98% Physical Exam  Constitutional: She is oriented to person, place, and time. She appears well-developed and well-nourished. No distress.  HENT:  Head: Normocephalic and atraumatic.  Right Ear: External ear normal.  Left Ear: External ear normal.  Nose: Nose normal.  Mouth/Throat: Oropharynx is clear and moist. No oropharyngeal exudate.  Eyes: Conjunctivae are normal. Pupils are equal, round, and reactive to light. Right eye exhibits no  discharge. Left eye exhibits no discharge. No scleral icterus.  Neck: Normal range of motion. Neck supple. No tracheal deviation present. No thyromegaly present.  Cardiovascular: Normal rate, regular rhythm, normal heart sounds and intact distal pulses.  Exam reveals no gallop and no friction rub.   No murmur heard. Pulmonary/Chest: Effort normal and breath sounds normal. No accessory muscle usage. No respiratory distress. She has no decreased breath sounds. She has no wheezes. She has no rhonchi. She has no rales. She exhibits no tenderness.  Musculoskeletal: Normal range of motion. She exhibits no edema or tenderness.  Lymphadenopathy:    She has no cervical adenopathy.  Neurological: She is alert and oriented to person, place, and time. No cranial nerve deficit. She exhibits normal muscle tone. Coordination normal.  Skin: Skin is warm and dry. No rash noted. She is not diaphoretic. No erythema. No pallor.  Psychiatric: She has a normal mood and affect. Her behavior is normal. Judgment and thought content normal.          Assessment & Plan:   Problem List Items Addressed This Visit      Unprioritized   Viral URI with cough - Primary    Symptoms improving. Encouraged continued supportive care with rest, adequate fluids, Hycodan for cough. Follow up prn if symptoms are not improving.          Return in about 3 months (around 11/20/2014) for Wellness Visit.

## 2014-08-20 NOTE — Progress Notes (Signed)
Pre visit review using our clinic review tool, if applicable. No additional management support is needed unless otherwise documented below in the visit note. 

## 2014-08-20 NOTE — Patient Instructions (Signed)
Follow up in 3 months for Wellness Visit.  Continue supportive care for cough. Rest, adequate fluids. Use Hycodan as needed for cough.

## 2014-10-02 ENCOUNTER — Other Ambulatory Visit: Payer: Self-pay | Admitting: Internal Medicine

## 2014-10-02 NOTE — Telephone Encounter (Signed)
Last OV 3.21.16, last refill 2.24.16.  Please advise refill

## 2014-11-14 ENCOUNTER — Other Ambulatory Visit: Payer: Self-pay | Admitting: Internal Medicine

## 2014-11-14 NOTE — Telephone Encounter (Signed)
Last OV 3.21.16, next OV 6.23.16. Ok to refill?

## 2014-11-22 ENCOUNTER — Ambulatory Visit (INDEPENDENT_AMBULATORY_CARE_PROVIDER_SITE_OTHER): Payer: PPO | Admitting: Internal Medicine

## 2014-11-22 ENCOUNTER — Encounter: Payer: Self-pay | Admitting: Internal Medicine

## 2014-11-22 VITALS — BP 131/78 | HR 72 | Temp 98.3°F | Ht 64.0 in | Wt 204.1 lb

## 2014-11-22 DIAGNOSIS — R1314 Dysphagia, pharyngoesophageal phase: Secondary | ICD-10-CM | POA: Insufficient documentation

## 2014-11-22 DIAGNOSIS — Z1283 Encounter for screening for malignant neoplasm of skin: Secondary | ICD-10-CM | POA: Diagnosis not present

## 2014-11-22 DIAGNOSIS — I8393 Asymptomatic varicose veins of bilateral lower extremities: Secondary | ICD-10-CM

## 2014-11-22 DIAGNOSIS — Z0000 Encounter for general adult medical examination without abnormal findings: Secondary | ICD-10-CM | POA: Diagnosis not present

## 2014-11-22 DIAGNOSIS — I8390 Asymptomatic varicose veins of unspecified lower extremity: Secondary | ICD-10-CM | POA: Insufficient documentation

## 2014-11-22 DIAGNOSIS — R110 Nausea: Secondary | ICD-10-CM

## 2014-11-22 LAB — CBC WITH DIFFERENTIAL/PLATELET
Basophils Absolute: 0 10*3/uL (ref 0.0–0.1)
Basophils Relative: 0.6 % (ref 0.0–3.0)
Eosinophils Absolute: 0.3 10*3/uL (ref 0.0–0.7)
Eosinophils Relative: 3.6 % (ref 0.0–5.0)
HEMATOCRIT: 39.2 % (ref 36.0–46.0)
HEMOGLOBIN: 13 g/dL (ref 12.0–15.0)
Lymphocytes Relative: 29.5 % (ref 12.0–46.0)
Lymphs Abs: 2.1 10*3/uL (ref 0.7–4.0)
MCHC: 33.2 g/dL (ref 30.0–36.0)
MCV: 95.2 fl (ref 78.0–100.0)
MONO ABS: 0.7 10*3/uL (ref 0.1–1.0)
Monocytes Relative: 9.4 % (ref 3.0–12.0)
Neutro Abs: 4.1 10*3/uL (ref 1.4–7.7)
Neutrophils Relative %: 56.9 % (ref 43.0–77.0)
Platelets: 237 10*3/uL (ref 150.0–400.0)
RBC: 4.12 Mil/uL (ref 3.87–5.11)
RDW: 14.1 % (ref 11.5–15.5)
WBC: 7.2 10*3/uL (ref 4.0–10.5)

## 2014-11-22 LAB — LIPID PANEL
Cholesterol: 252 mg/dL — ABNORMAL HIGH (ref 0–200)
HDL: 60.1 mg/dL (ref >39.00)
LDL Cholesterol: 164 mg/dL — ABNORMAL HIGH (ref 0–99)
NonHDL: 191.9
Total CHOL/HDL Ratio: 4
Triglycerides: 138 mg/dL (ref 0.0–149.0)
VLDL: 27.6 mg/dL (ref 0.0–40.0)

## 2014-11-22 LAB — COMPREHENSIVE METABOLIC PANEL WITH GFR
ALT: 13 U/L (ref 0–35)
AST: 15 U/L (ref 0–37)
Albumin: 4.1 g/dL (ref 3.5–5.2)
Alkaline Phosphatase: 67 U/L (ref 39–117)
BUN: 17 mg/dL (ref 6–23)
CO2: 33 meq/L — ABNORMAL HIGH (ref 19–32)
Calcium: 9.8 mg/dL (ref 8.4–10.5)
Chloride: 99 meq/L (ref 96–112)
Creatinine, Ser: 0.81 mg/dL (ref 0.40–1.20)
GFR: 74.9 mL/min (ref >60.00)
Glucose, Bld: 93 mg/dL (ref 70–99)
Potassium: 4.2 meq/L (ref 3.5–5.1)
Sodium: 137 meq/L (ref 135–145)
Total Bilirubin: 0.4 mg/dL (ref 0.2–1.2)
Total Protein: 7 g/dL (ref 6.0–8.3)

## 2014-11-22 LAB — TSH: TSH: 2.32 u[IU]/mL (ref 0.35–4.50)

## 2014-11-22 LAB — MICROALBUMIN / CREATININE URINE RATIO
CREATININE, U: 96.4 mg/dL
MICROALB UR: 0.8 mg/dL (ref 0.0–1.9)
MICROALB/CREAT RATIO: 0.8 mg/g (ref 0.0–30.0)

## 2014-11-22 LAB — HEMOGLOBIN A1C: Hgb A1c MFr Bld: 5.7 % (ref 4.6–6.5)

## 2014-11-22 LAB — VITAMIN D 25 HYDROXY (VIT D DEFICIENCY, FRACTURES): VITD: 10.98 ng/mL — ABNORMAL LOW (ref 30.00–100.00)

## 2014-11-22 MED ORDER — PANTOPRAZOLE SODIUM 40 MG PO TBEC: 40.0000 mg | DELAYED_RELEASE_TABLET | Freq: Every day | ORAL | Status: DC

## 2014-11-22 NOTE — Assessment & Plan Note (Signed)
Superficial varicose veins noted. Recommended use of knee high compression hose. Discussed potential vascular evaluation and potential cosmetic treatment of varicosities. Follow up 3 months and prn.

## 2014-11-22 NOTE — Progress Notes (Signed)
Pre visit review using our clinic review tool, if applicable. No additional management support is needed unless otherwise documented below in the visit note. 

## 2014-11-22 NOTE — Assessment & Plan Note (Signed)
Recent nausea with taking medications that are in capsule form. Encouraged her to ask pharmacist about changing to tablets. Will also set up GI evaluation for possible endoscopy to look for narrowing. Start Pantoprazole 40mg  daily.

## 2014-11-22 NOTE — Assessment & Plan Note (Signed)
Referral placed to dermatology

## 2014-11-22 NOTE — Progress Notes (Signed)
Subjective:    Patient ID: Dana Obrien, female    DOB: 11/30/1945, 69 y.o.   MRN: [id number]  HPI  The patient is here for annual Medicare wellness examination and management of other chronic and acute problems.   The risk factors are reflected in the social history.  The roster of all physicians providing medical care to patient - is listed in the Snapshot section of the chart.  Activities of daily living:  The patient is 100% independent in all ADLs: dressing, toileting, feeding as well as independent mobility. Lives with husband, however he is in [facility] periodically. Also has dog and cat in home. Home is two story.  Home safety : The patient has smoke detectors in the home. They wear seatbelts.  There are no firearms at home. There is no violence in the home.   There is no risks for hepatitis, STDs or HIV. There is no history of blood transfusion. They have no travel history to infectious disease endemic areas of the world.  The patient has seen their dentist in the last six month. Dentist - Dr. Carman Ching  They have seen their eye doctor in the last year. [facility] with 80% deficit in left ear and 60% deficit in right. Wears hearing aids, last changed 1 month ago. [facility]  They do not  have excessive sun exposure. Discussed the need for sun protection: hats, long sleeves and use of sunscreen if there is significant sun exposure. Dermatologist - Dr. Sharlett Iles in the past  Diet: the importance of a healthy diet is discussed. They do have a relatively healthy diet.  The benefits of regular aerobic exercise were discussed. She walks typically daily.  Depression screen: there are no signs or vegative symptoms of depression- irritability, change in appetite, anhedonia, sadness/tearfullness.  Cognitive assessment: the patient manages all their financial and personal affairs and is actively engaged. They could relate day,date,year and events.  The following  portions of the patient's history were reviewed and updated as appropriate: allergies, current medications, past family history, past medical history,  past surgical history, past social history  and problem list.  Visual acuity was not assessed per patient preference since she has regular follow up with her ophthalmologist. Hearing and body mass index were assessed and reviewed.   During the course of the visit the patient was educated and counseled about appropriate screening and preventive services including : fall prevention , diabetes screening, nutrition counseling, colorectal cancer screening, and recommended immunizations.    ACUTE ISSUES: NAUSEA: Having some nausea when swallowing pills that are capsules. No vomiting. Notes some pain and reflux symptoms with eating, esp at night  Varicose Veins: Concerned about worsening varicose veins in her legs. Painful at times. Occasional mild swelling noted with prolonged standing. Has not been wearing compression stockings.  Past medical, surgical, family and social history per today's encounter.  Review of Systems  Constitutional: Negative for fever, chills, appetite change, fatigue and unexpected weight change.  HENT: Positive for trouble swallowing.   Eyes: Negative for visual disturbance.  Respiratory: Negative for shortness of breath.   Cardiovascular: Negative for chest pain and leg swelling.  Gastrointestinal: Positive for nausea. Negative for vomiting, abdominal pain, diarrhea and constipation.  Skin: Negative for color change and rash.  Hematological: Negative for adenopathy. Does not bruise/bleed easily.  Psychiatric/Behavioral: Negative for suicidal ideas, sleep disturbance and dysphoric mood. The patient is not nervous/anxious.        Objective:  BP 131/78 mmHg  Pulse 72  Temp(Src) 98.3 F (36.8 C) (Oral)  Ht 5\' 4"  (1.626 m)  Wt 204 lb 2 oz (92.59 kg)  BMI 35.02 kg/m2  SpO2 96% Physical Exam  Constitutional: She is  oriented to person, place, and time. She appears well-developed and well-nourished. No distress.  HENT:  Head: Normocephalic and atraumatic.  Right Ear: External ear normal.  Left Ear: External ear normal.  Nose: Nose normal.  Mouth/Throat: Oropharynx is clear and moist. No oropharyngeal exudate.  Eyes: Conjunctivae are normal. Pupils are equal, round, and reactive to light. Right eye exhibits no discharge. Left eye exhibits no discharge. No scleral icterus.  Neck: Normal range of motion. Neck supple. No tracheal deviation present. No thyromegaly present.  Cardiovascular: Normal rate, regular rhythm, normal heart sounds and intact distal pulses.  Exam reveals no gallop and no friction rub.   No murmur heard. Pulmonary/Chest: Effort normal and breath sounds normal. No accessory muscle usage. No tachypnea. No respiratory distress. She has no decreased breath sounds. She has no wheezes. She has no rhonchi. She has no rales. She exhibits no tenderness. Right breast exhibits no inverted nipple, no mass, no nipple discharge, no skin change and no tenderness. Left breast exhibits no inverted nipple, no mass, no nipple discharge, no skin change and no tenderness. Breasts are symmetrical.    Abdominal: Soft. Bowel sounds are normal. She exhibits no distension and no mass. There is no tenderness. There is no rebound and no guarding.  Musculoskeletal: Normal range of motion. She exhibits no edema or tenderness.  Lymphadenopathy:    She has no cervical adenopathy.  Neurological: She is alert and oriented to person, place, and time. No cranial nerve deficit. She exhibits normal muscle tone. Coordination normal.  Skin: Skin is warm and dry. No rash noted. She is not diaphoretic. No erythema. No pallor.  Few superficial varicosities noted bilateral LE  Psychiatric: She has a normal mood and affect. Her behavior is normal. Judgment and thought content normal.          Assessment & Plan:   Problem List  Items Addressed This Visit      Unprioritized   Dysphagia, pharyngoesophageal phase    Recent symptoms of nausea when swallowing capsules. Will set up GI evaluation for possible upper endoscopy. Start Pantoprazole 40mg  daily. Discuss change in medications to tablet form with pharmacist.      Relevant Orders   Ambulatory referral to Gastroenterology   Medicare annual wellness visit, subsequent - Primary    General medical exam including breast exam. PAP and pelvic deferred as PAP normal, HPV neg in 2012. Mammogram ordered. Discussed limitations of PAP and pelvic as screening for ovarian cancer. Discussed potentially ordering a repeat pelvic US in the future. Colonoscopy UTD. Immunizations UTD. Encouraged healthy diet and exercise. Appropriate screening performed.  Patient was given a handout regarding current recommendations for health maintenance and preventative care on the AVS.         Relevant Orders   MM Digital Screening   TSH   CBC with Differential/Platelet   Comprehensive metabolic panel   Lipid panel   Hemoglobin A1c   Microalbumin / creatinine urine ratio   Vit D  25 hydroxy (rtn osteoporosis monitoring)   Nausea    Recent nausea with taking medications that are in capsule form. Encouraged her to ask pharmacist about changing to tablets. Will also set up GI evaluation for possible endoscopy to look for narrowing. Start Pantoprazole 40mg  daily.  Relevant Orders   Ambulatory referral to Gastroenterology   Screening for skin cancer    Referral placed to dermatology      Relevant Orders   Ambulatory referral to Dermatology   Varicose vein of leg    Superficial varicose veins noted. Recommended use of knee high compression hose. Discussed potential vascular evaluation and potential cosmetic treatment of varicosities. Follow up 3 months and prn.          Return in about 3 months (around 02/22/2015) for Recheck.

## 2014-11-22 NOTE — Patient Instructions (Addendum)
Start Pantoprazole daily to help with reflux. We will set up evaluation with Dr. Tiffany Kocher. Ask pharmacist about changing all capsules to tablets.  Health Maintenance Adopting a healthy lifestyle and getting preventive care can go a long way to promote health and wellness. Talk with your health care provider about what schedule of regular examinations is right for you. This is a good chance for you to check in with your provider about disease prevention and staying healthy. In between checkups, there are plenty of things you can do on your own. Experts have done a lot of research about which lifestyle changes and preventive measures are most likely to keep you healthy. Ask your health care provider for more information. WEIGHT AND DIET  Eat a healthy diet  Be sure to include plenty of vegetables, fruits, low-fat dairy products, and lean protein.  Do not eat a lot of foods high in solid fats, added sugars, or salt.  Get regular exercise. This is one of the most important things you can do for your health.  Most adults should exercise for at least 150 minutes each week. The exercise should increase your heart rate and make you sweat (moderate-intensity exercise).  Most adults should also do strengthening exercises at least twice a week. This is in addition to the moderate-intensity exercise.  Maintain a healthy weight  Body mass index (BMI) is a measurement that can be used to identify possible weight problems. It estimates body fat based on height and weight. Your health care provider can help determine your BMI and help you achieve or maintain a healthy weight.  For females 53 years of age and older:   A BMI below 18.5 is considered underweight.  A BMI of 18.5 to 24.9 is normal.  A BMI of 25 to 29.9 is considered overweight.  A BMI of 30 and above is considered obese.  Watch levels of cholesterol and blood lipids  You should start having your blood tested for lipids and cholesterol  at 67 years of age, then have this test every 5 years.  You may need to have your cholesterol levels checked more often if:  Your lipid or cholesterol levels are high.  You are older than 67 years of age.  You are at high risk for heart disease.  CANCER SCREENING   Lung Cancer  Lung cancer screening is recommended for adults 79-56 years old who are at high risk for lung cancer because of a history of smoking.  A yearly low-dose CT scan of the lungs is recommended for people who:  Currently smoke.  Have quit within the past 15 years.  Have at least a 30-pack-year history of smoking. A pack year is smoking an average of one pack of cigarettes a day for 1 year.  Yearly screening should continue until it has been 15 years since you quit.  Yearly screening should stop if you develop a health problem that would prevent you from having lung cancer treatment.  Breast Cancer  Practice breast self-awareness. This means understanding how your breasts normally appear and feel.  It also means doing regular breast self-exams. Let your health care provider know about any changes, no matter how small.  If you are in your 20s or 30s, you should have a clinical breast exam (CBE) by a health care provider every 1-3 years as part of a regular health exam.  If you are 52 or older, have a CBE every year. Also consider having a breast X-ray (mammogram) every  year.  If you have a family history of breast cancer, talk to your health care provider about genetic screening.  If you are at high risk for breast cancer, talk to your health care provider about having an MRI and a mammogram every year.  Breast cancer gene (BRCA) assessment is recommended for women who have family members with BRCA-related cancers. BRCA-related cancers include:  Breast.  Ovarian.  Tubal.  Peritoneal cancers.  Results of the assessment will determine the need for genetic counseling and BRCA1 and BRCA2  testing. Cervical Cancer Routine pelvic examinations to screen for cervical cancer are no longer recommended for nonpregnant women who are considered low risk for cancer of the pelvic organs (ovaries, uterus, and vagina) and who do not have symptoms. A pelvic examination may be necessary if you have symptoms including those associated with pelvic infections. Ask your health care provider if a screening pelvic exam is right for you.   The Pap test is the screening test for cervical cancer for women who are considered at risk.  If you had a hysterectomy for a problem that was not cancer or a condition that could lead to cancer, then you no longer need Pap tests.  If you are older than 65 years, and you have had normal Pap tests for the past 10 years, you no longer need to have Pap tests.  If you have had past treatment for cervical cancer or a condition that could lead to cancer, you need Pap tests and screening for cancer for at least 20 years after your treatment.  If you no longer get a Pap test, assess your risk factors if they change (such as having a new sexual partner). This can affect whether you should start being screened again.  Some women have medical problems that increase their chance of getting cervical cancer. If this is the case for you, your health care provider may recommend more frequent screening and Pap tests.  The human papillomavirus (HPV) test is another test that may be used for cervical cancer screening. The HPV test looks for the virus that can cause cell changes in the cervix. The cells collected during the Pap test can be tested for HPV.  The HPV test can be used to screen women 18 years of age and older. Getting tested for HPV can extend the interval between normal Pap tests from three to five years.  An HPV test also should be used to screen women of any age who have unclear Pap test results.  After 67 years of age, women should have HPV testing as often as Pap  tests.  Colorectal Cancer  This type of cancer can be detected and often prevented.  Routine colorectal cancer screening usually begins at 67 years of age and continues through 67 years of age.  Your health care provider may recommend screening at an earlier age if you have risk factors for colon cancer.  Your health care provider may also recommend using home test kits to check for hidden blood in the stool.  A small camera at the end of a tube can be used to examine your colon directly (sigmoidoscopy or colonoscopy). This is done to check for the earliest forms of colorectal cancer.  Routine screening usually begins at age 23.  Direct examination of the colon should be repeated every 5-10 years through 67 years of age. However, you may need to be screened more often if early forms of precancerous polyps or small growths are found.  Skin Cancer  Check your skin from head to toe regularly.  Tell your health care provider about any new moles or changes in moles, especially if there is a change in a mole's shape or color.  Also tell your health care provider if you have a mole that is larger than the size of a pencil eraser.  Always use sunscreen. Apply sunscreen liberally and repeatedly throughout the day.  Protect yourself by wearing long sleeves, pants, a wide-brimmed hat, and sunglasses whenever you are outside. HEART DISEASE, DIABETES, AND HIGH BLOOD PRESSURE   Have your blood pressure checked at least every 1-2 years. High blood pressure causes heart disease and increases the risk of stroke.  If you are between 33 years and 50 years old, ask your health care provider if you should take aspirin to prevent strokes.  Have regular diabetes screenings. This involves taking a blood sample to check your fasting blood sugar level.  If you are at a normal weight and have a low risk for diabetes, have this test once every three years after 67 years of age.  If you are overweight and  have a high risk for diabetes, consider being tested at a younger age or more often. PREVENTING INFECTION  Hepatitis B  If you have a higher risk for hepatitis B, you should be screened for this virus. You are considered at high risk for hepatitis B if:  You were born in a country where hepatitis B is common. Ask your health care provider which countries are considered high risk.  Your parents were born in a high-risk country, and you have not been immunized against hepatitis B (hepatitis B vaccine).  You have HIV or AIDS.  You use needles to inject street drugs.  You live with someone who has hepatitis B.  You have had sex with someone who has hepatitis B.  You get hemodialysis treatment.  You take certain medicines for conditions, including cancer, organ transplantation, and autoimmune conditions. Hepatitis C  Blood testing is recommended for:  Everyone born from [birth year] through 1963.  Anyone with known risk factors for hepatitis C. Sexually transmitted infections (STIs)  You should be screened for sexually transmitted infections (STIs) including gonorrhea and chlamydia if:  You are sexually active and are younger than 67 years of age.  You are older than 67 years of age and your health care provider tells you that you are at risk for this type of infection.  Your sexual activity has changed since you were last screened and you are at an increased risk for chlamydia or gonorrhea. Ask your health care provider if you are at risk.  If you do not have HIV, but are at risk, it may be recommended that you take a prescription medicine daily to prevent HIV infection. This is called pre-exposure prophylaxis (PrEP). You are considered at risk if:  You are sexually active and do not regularly use condoms or know the HIV status of your partner(s).  You take drugs by injection.  You are sexually active with a partner who has HIV. Talk with your health care provider about whether you  are at high risk of being infected with HIV. If you choose to begin PrEP, you should first be tested for HIV. You should then be tested every 3 months for as long as you are taking PrEP.  PREGNANCY   If you are premenopausal and you may become pregnant, ask your health care provider about preconception counseling.  If you  may become pregnant, take 400 to 800 micrograms (mcg) of folic acid every day.  If you want to prevent pregnancy, talk to your health care provider about birth control (contraception). OSTEOPOROSIS AND MENOPAUSE   Osteoporosis is a disease in which the bones lose minerals and strength with aging. This can result in serious bone fractures. Your risk for osteoporosis can be identified using a bone density scan.  If you are 13 years of age or older, or if you are at risk for osteoporosis and fractures, ask your health care provider if you should be screened.  Ask your health care provider whether you should take a calcium or vitamin D supplement to lower your risk for osteoporosis.  Menopause may have certain physical symptoms and risks.  Hormone replacement therapy may reduce some of these symptoms and risks. Talk to your health care provider about whether hormone replacement therapy is right for you.  HOME CARE INSTRUCTIONS   Schedule regular health, dental, and eye exams.  Stay current with your immunizations.   Do not use any tobacco products including cigarettes, chewing tobacco, or electronic cigarettes.  If you are pregnant, do not drink alcohol.  If you are breastfeeding, limit how much and how often you drink alcohol.  Limit alcohol intake to no more than 1 drink per day for nonpregnant women. One drink equals 12 ounces of beer, 5 ounces of wine, or 1 ounces of hard liquor.  Do not use street drugs.  Do not share needles.  Ask your health care provider for help if you need support or information about quitting drugs.  Tell your health care provider if  you often feel depressed.  Tell your health care provider if you have ever been abused or do not feel safe at home. Document Released: 12/01/2010 Document Revised: 10/02/2013 Document Reviewed: 04/19/2013 [facility] Patient Information 2015 [place], [place]. This information is not intended to replace advice given to you by your health care provider. Make sure you discuss any questions you have with your health care provider.

## 2014-11-22 NOTE — Assessment & Plan Note (Signed)
General medical exam including breast exam. PAP and pelvic deferred as PAP normal, HPV neg in 2012. Mammogram ordered. Discussed limitations of PAP and pelvic as screening for ovarian cancer. Discussed potentially ordering a repeat pelvic US in the future. Colonoscopy UTD. Immunizations UTD. Encouraged healthy diet and exercise. Appropriate screening performed.  Patient was given a handout regarding current recommendations for health maintenance and preventative care on the AVS.

## 2014-11-22 NOTE — Assessment & Plan Note (Signed)
Recent symptoms of nausea when swallowing capsules. Will set up GI evaluation for possible upper endoscopy. Start Pantoprazole 40mg  daily. Discuss change in medications to tablet form with pharmacist.

## 2014-12-24 ENCOUNTER — Ambulatory Visit
Admission: RE | Admit: 2014-12-24 | Discharge: 2014-12-24 | Disposition: A | Discharge status: Home / Self Care | Payer: PPO | Source: Ambulatory Visit | Attending: Nurse Practitioner | Admitting: Nurse Practitioner

## 2014-12-24 ENCOUNTER — Ambulatory Visit (INDEPENDENT_AMBULATORY_CARE_PROVIDER_SITE_OTHER): Payer: PPO | Admitting: Nurse Practitioner

## 2014-12-24 VITALS — BP 136/88 | HR 82 | Temp 98.4°F | Resp 16 | Ht 64.0 in | Wt 205.8 lb

## 2014-12-24 DIAGNOSIS — M419 Scoliosis, unspecified: Secondary | ICD-10-CM | POA: Insufficient documentation

## 2014-12-24 DIAGNOSIS — M545 Low back pain: Secondary | ICD-10-CM | POA: Diagnosis not present

## 2014-12-24 DIAGNOSIS — M47814 Spondylosis without myelopathy or radiculopathy, thoracic region: Secondary | ICD-10-CM | POA: Insufficient documentation

## 2014-12-24 MED ORDER — MELOXICAM 7.5 MG PO TABS: 7.5000 mg | ORAL_TABLET | Freq: Every day | ORAL | Status: DC

## 2014-12-24 NOTE — Progress Notes (Signed)
Pre visit review using our clinic review tool, if applicable. No additional management support is needed unless otherwise documented below in the visit note. 

## 2014-12-24 NOTE — Patient Instructions (Signed)
Back pain- try meloxicam  Dana Obrien, [place] [id number]  Follow up next week

## 2014-12-24 NOTE — Progress Notes (Signed)
Subjective:    Patient ID: Dana Obrien, female    DOB: [birth year], 70 y.o.   MRN: [id number]  HPI  Ms. Handley is a 69 yo female with a CC of back pain x 7 days.   1) Right side of mid to low back. Up to shoulder blades; worsening she reports.   Onset- 7 days ago  Location- bilateral mid back  Duration- Daily  Characteristics- Aching, dull, sore; denies radiating pain down a leg Aggravating factors- Lying down  Relieving factors- Nothing yet that she notices Severity- 10/10 worst, 8/10 best  Ibuprofen- not helpful  Heat- somewhat helpful   Denies trauma, does not remember any predisposing factors.   Review of Systems  Constitutional: Negative for fever, chills, diaphoresis and fatigue.  Respiratory: Negative for chest tightness, shortness of breath and wheezing.   Cardiovascular: Negative for chest pain, palpitations and leg swelling.  Gastrointestinal: Negative for nausea, vomiting, diarrhea and blood in stool.  Musculoskeletal: Positive for back pain.  Skin: Negative for rash.  Neurological: Negative for dizziness, weakness, numbness and headaches.       Denies losing bowel/bladder function, saddle anesthesia, or weakness of extremities.   Psychiatric/Behavioral: The patient is not nervous/anxious.    Past Medical History  Diagnosis Date  . Hypertension   . Migraine   . Cancer 2004    right lumpectomy, chemotherapy and radiation Dr. Jeb Levering  . Depression   . Vitamin D deficiency     IN THE PAST    History   Social History  . Marital Status: Legally Separated    Spouse Name: N/A  . Number of Children: N/A  . Years of Education: N/A   Occupational History  . Not on file.   Social History Main Topics  . Smoking status: Never Smoker   . Smokeless tobacco: Never Used  . Alcohol Use: Yes     Comment: rarely  . Drug Use: No  . Sexual Activity: Not on file   Other Topics Concern  . Not on file   Social History Narrative   Daily Caffeine Use:  1-2  coffee   Regular Exercise -  NO    Past Surgical History  Procedure Laterality Date  . Breast excisional biopsy Right 2004    chemo and rad  . Breast biopsy Right     stereo prior to lumpectomy    Family History  Problem Relation Age of Onset  . Cancer Sister     COLON    Allergies  Allergen Reactions  . Niacin And Related     ANTIHYPERLIPEMICS    Current Outpatient Prescriptions on File Prior to Visit  Medication Sig Dispense Refill  . albuterol (PROVENTIL HFA;VENTOLIN HFA) 108 (90 BASE) MCG/ACT inhaler Inhale 2 puffs into the lungs every 6 (six) hours as needed. 1 Inhaler 3  . ALPRAZolam (XANAX) 0.5 MG tablet TAKE ONE TABLET BY MOUTH THREE TIMES DAILY AS NEEDED 90 tablet 0  . atorvastatin (LIPITOR) 20 MG tablet Take 1 tablet (20 mg total) by mouth daily. 90 tablet 1  . buPROPion (WELLBUTRIN XL) 300 MG 24 hr tablet TAKE ONE TABLET BY MOUTH ONCE DAILY 90 tablet 0  . FLUoxetine (PROZAC) 20 MG capsule Take 3 capsules (60 mg total) by mouth daily. 90 capsule 2  . fluticasone (FLONASE) 50 MCG/ACT nasal spray USE TWO SPRAYS IN EACH NOSTRIL ONCE DAILY 16 g 5  . gabapentin (NEURONTIN) 300 MG capsule Take 1 capsule (300 mg total) by mouth 3 (three) times  daily. 180 capsule 3  . hydrOXYzine (ATARAX/VISTARIL) 25 MG tablet Take 1 tablet (25 mg total) by mouth 2 (two) times daily as needed. 90 tablet 3  . losartan-hydrochlorothiazide (HYZAAR) 100-25 MG per tablet Take 1 tablet by mouth daily. 90 tablet 1  . Multiple Vitamin (MULTIVITAMIN) capsule Take 1 capsule by mouth daily.    . pantoprazole (PROTONIX) 40 MG tablet Take 1 tablet (40 mg total) by mouth daily. 30 tablet 3  . SUMAtriptan (IMITREX) 50 MG tablet Take 1 tablet (50 mg total) by mouth as needed. 10 tablet 3  . tretinoin (RETIN-A) 0.05 % cream Apply 1 application topically at bedtime. 45 g 0   No current facility-administered medications on file prior to visit.      Objective:   Physical Exam  Constitutional: She is  oriented to person, place, and time. She appears well-developed and well-nourished. No distress.  BP 136/88 mmHg  Pulse 82  Temp(Src) 98.4 F (36.9 C)  Resp 16  Ht 5\' 4"  (1.626 m)  Wt 205 lb 12.8 oz (93.35 kg)  BMI 35.31 kg/m2  SpO2 91%   HENT:  Head: Normocephalic and atraumatic.  Right Ear: External ear normal.  Left Ear: External ear normal.  Cardiovascular: Normal rate, regular rhythm and normal heart sounds.  Exam reveals no gallop and no friction rub.   No murmur heard. Pulmonary/Chest: Effort normal and breath sounds normal. No respiratory distress. She has no wheezes. She has no rales. She exhibits no tenderness.  Neurological: She is alert and oriented to person, place, and time. No cranial nerve deficit. She exhibits normal muscle tone. Coordination normal.  Iliopsoas 5/5 Bilateral, Tib anterior 5/5 bilateral, EHL 5/5 bilateral, no ankle clonus, intact heel/toe/sequential walking, sensation intact upper and lower extremities. Straight leg raise negative bilaterally.   Skin: Skin is warm and dry. No rash noted. She is not diaphoretic.  Psychiatric: She has a normal mood and affect. Her behavior is normal. Judgment and thought content normal.          Assessment & Plan:

## 2014-12-31 ENCOUNTER — Ambulatory Visit
Admission: RE | Admit: 2014-12-31 | Discharge: 2014-12-31 | Disposition: A | Discharge status: Home / Self Care | Payer: PPO | Source: Ambulatory Visit | Attending: Internal Medicine | Admitting: Internal Medicine

## 2014-12-31 ENCOUNTER — Other Ambulatory Visit: Payer: Self-pay | Admitting: Internal Medicine

## 2014-12-31 DIAGNOSIS — Z1231 Encounter for screening mammogram for malignant neoplasm of breast: Secondary | ICD-10-CM | POA: Insufficient documentation

## 2014-12-31 DIAGNOSIS — Z0000 Encounter for general adult medical examination without abnormal findings: Secondary | ICD-10-CM | POA: Diagnosis present

## 2015-01-01 ENCOUNTER — Encounter: Payer: Self-pay | Admitting: Internal Medicine

## 2015-01-01 ENCOUNTER — Ambulatory Visit (INDEPENDENT_AMBULATORY_CARE_PROVIDER_SITE_OTHER): Payer: PPO | Admitting: Internal Medicine

## 2015-01-01 VITALS — BP 127/80 | HR 78 | Temp 97.9°F | Ht 60.0 in | Wt 208.4 lb

## 2015-01-01 DIAGNOSIS — M545 Low back pain, unspecified: Secondary | ICD-10-CM | POA: Insufficient documentation

## 2015-01-01 MED ORDER — MELOXICAM 15 MG PO TABS: 15.0000 mg | ORAL_TABLET | Freq: Every day | ORAL | Status: DC

## 2015-01-01 NOTE — Assessment & Plan Note (Signed)
Low back pain. Reviewed recent xrays which showed scoliosis and degenerative disease in the lower spine. increase Meloxicam to 15mg  daily and place referral for physical therapy. If no improvement, then we discussed referral to Dr. Sharlet Salina for evaluation.

## 2015-01-01 NOTE — Progress Notes (Signed)
   Subjective:    Patient ID: Dana Obrien, female    DOB: Dec 01, 1945, 69 y.o.   MRN: [id number]  HPI  69YO female presents for follow up.  Recently seen for low back pain. Started on Meloxicam. Aching pain in lower back improved with meloxicam, however this wears off in the evening. Worsened with prolonged standing, esp on hard floors. Improved with rest. No weakness, numbness. No radiating pain.   Past medical, surgical, family and social history per today's encounter.  Review of Systems  Constitutional: Negative for fever, chills, appetite change, fatigue and unexpected weight change.  Eyes: Negative for visual disturbance.  Respiratory: Negative for shortness of breath.   Cardiovascular: Negative for chest pain and leg swelling.  Gastrointestinal: Negative for abdominal pain.  Musculoskeletal: Positive for myalgias, back pain and arthralgias. Negative for gait problem.  Skin: Negative for color change and rash.  Neurological: Negative for dizziness, weakness, light-headedness and numbness.  Hematological: Negative for adenopathy. Does not bruise/bleed easily.  Psychiatric/Behavioral: Negative for dysphoric mood. The patient is not nervous/anxious.        Objective:    BP 127/80 mmHg  Pulse 78  Temp(Src) 97.9 F (36.6 C) (Oral)  Ht 5' (1.524 m)  Wt 208 lb 6 oz (94.518 kg)  BMI 40.70 kg/m2  SpO2 96% Physical Exam  Constitutional: She is oriented to person, place, and time. She appears well-developed and well-nourished. No distress.  HENT:  Head: Normocephalic and atraumatic.  Right Ear: External ear normal.  Left Ear: External ear normal.  Nose: Nose normal.  Mouth/Throat: Oropharynx is clear and moist. No oropharyngeal exudate.  Eyes: Conjunctivae are normal. Pupils are equal, round, and reactive to light. Right eye exhibits no discharge. Left eye exhibits no discharge. No scleral icterus.  Neck: Normal range of motion. Neck supple. No tracheal deviation  present. No thyromegaly present.  Cardiovascular: Normal rate, regular rhythm, normal heart sounds and intact distal pulses.  Exam reveals no gallop and no friction rub.   No murmur heard. Pulmonary/Chest: Effort normal and breath sounds normal. No respiratory distress. She has no wheezes. She has no rales. She exhibits no tenderness.  Musculoskeletal: Normal range of motion. She exhibits no edema or tenderness.       Lumbar back: She exhibits pain. She exhibits normal range of motion, no tenderness, no bony tenderness, no edema and no deformity.  Lymphadenopathy:    She has no cervical adenopathy.  Neurological: She is alert and oriented to person, place, and time. No cranial nerve deficit. She exhibits normal muscle tone. Coordination normal.  Skin: Skin is warm and dry. No rash noted. She is not diaphoretic. No erythema. No pallor.  Psychiatric: She has a normal mood and affect. Her behavior is normal. Judgment and thought content normal.          Assessment & Plan:   Problem List Items Addressed This Visit      Unprioritized   Low back pain - Primary    Low back pain. Reviewed recent xrays which showed scoliosis and degenerative disease in the lower spine. increase Meloxicam to 15mg  daily and place referral for physical therapy. If no improvement, then we discussed referral to Dr. Sharlet Salina for evaluation.      Relevant Medications   meloxicam (MOBIC) 15 MG tablet   Other Relevant Orders   Ambulatory referral to Physical Therapy       Return in about 4 weeks (around 01/29/2015) for Recheck.

## 2015-01-01 NOTE — Progress Notes (Signed)
Pre visit review using our clinic review tool, if applicable. No additional management support is needed unless otherwise documented below in the visit note. 

## 2015-01-01 NOTE — Patient Instructions (Addendum)
Increase Meloxicam to 15mg  daily.  We will set up physical therapy.  Follow up in 4 weeks.

## 2015-01-06 ENCOUNTER — Encounter: Payer: Self-pay | Admitting: Nurse Practitioner

## 2015-01-06 NOTE — Assessment & Plan Note (Signed)
Will obtain Lumbar and thoracic xrays due to both areas having tenderness. Mobic daily as needed for pain. Stretching and heat. No other NSAIDs w/ mobic. FU next week.

## 2015-01-07 ENCOUNTER — Telehealth: Payer: Self-pay | Admitting: Internal Medicine

## 2015-01-07 NOTE — Telephone Encounter (Signed)
Left msg to call office for appt details for PT referral /msn

## 2015-01-11 ENCOUNTER — Telehealth: Payer: Self-pay | Admitting: Internal Medicine

## 2015-01-11 NOTE — Telephone Encounter (Signed)
They need the order for PT.

## 2015-01-11 NOTE — Telephone Encounter (Signed)
Shirlean Mylar from [facility] [phone] called about a physical therapy prescription which was never sent. Thank You!

## 2015-01-14 ENCOUNTER — Other Ambulatory Visit: Payer: Self-pay | Admitting: Internal Medicine

## 2015-01-14 ENCOUNTER — Other Ambulatory Visit: Payer: Self-pay

## 2015-01-14 MED ORDER — FLUOXETINE HCL 20 MG PO CAPS: 60.0000 mg | ORAL_CAPSULE | Freq: Every day | ORAL | Status: DC

## 2015-01-14 NOTE — Telephone Encounter (Signed)
Pt wants a refill on prozac. Please advise how'd you'd like to proceed. A refill was sent for her xanax but i think it was already filled.

## 2015-01-16 NOTE — Telephone Encounter (Signed)
Okay to refill Xanax? Last refilled in May for 90 tablets. Please advise. Prozac was previously refilled this month (would not let me refuse it)

## 2015-01-17 NOTE — Telephone Encounter (Signed)
Rx faxed by [facility]

## 2015-01-22 ENCOUNTER — Encounter: Payer: Self-pay | Admitting: Internal Medicine

## 2015-01-29 ENCOUNTER — Other Ambulatory Visit: Payer: Self-pay | Admitting: Nurse Practitioner

## 2015-01-29 DIAGNOSIS — R1310 Dysphagia, unspecified: Secondary | ICD-10-CM

## 2015-02-12 ENCOUNTER — Ambulatory Visit
Admission: RE | Admit: 2015-02-12 | Discharge: 2015-02-12 | Disposition: A | Discharge status: Home / Self Care | Payer: PPO | Source: Ambulatory Visit | Attending: Nurse Practitioner | Admitting: Nurse Practitioner

## 2015-02-12 DIAGNOSIS — R1310 Dysphagia, unspecified: Secondary | ICD-10-CM | POA: Insufficient documentation

## 2015-02-12 NOTE — Therapy (Signed)
[place], [place], [address] Phone: [phone]   Fax:     Modified Barium Swallow  Patient Details  Name: Dana Obrien MRN: [id number] Date of Birth: [birth year] Referring Provider:  Marval Regal, NP  Encounter Date: 02/12/2015      End of Session - 02/12/15 1626    Visit Number 1   Number of Visits 1   Date for SLP Re-Evaluation 02/12/15   SLP Start Time 57   SLP Stop Time  1330   SLP Time Calculation (min) 60 min   Activity Tolerance Patient tolerated treatment well      Past Medical History  Diagnosis Date  . Hypertension   . Migraine   . Cancer 2004    right lumpectomy, chemotherapy and radiation Dr. Jeb Levering  . Depression   . Vitamin D deficiency     IN THE PAST    Past Surgical History  Procedure Laterality Date  . Breast excisional biopsy Right 2004    chemo and rad  . Breast biopsy Right     stereo prior to lumpectomy    There were no vitals filed for this visit.  Visit Diagnosis: Dysphagia - Plan: DG OP Swallowing Func-Medicare/Speech Path, DG OP Swallowing Func-Medicare/Speech Path   Subjective: Patient behavior: Alert, able to express her swallowing complaints and follow directions.  Chief complaint: coughing associated with taking pills, eating and drinking.  Patient stated that the coughing typically begins after she as been eating/drinking.   Objective:  Radiological Procedure: A videoflouroscopic evaluation of oral-preparatory, reflex initiation, and pharyngeal phases of the swallow was performed; as well as a screening of the upper esophageal phase.  I. POSTURE: Upright in MBS chair  II. VIEW: Lateral  III. COMPENSATORY STRATEGIES: N/A  IV. BOLUSES ADMINISTERED: Note: limited boluses as the radiologist is reluctant to expose patient to radiation.   Thin Liquid: 1 large sip    Puree: 1 teaspoon    Barium tablet  V. RESULTS OF  EVALUATION: A. ORAL PREPARATORY PHASE: (The lips, tongue, and velum are observed for strength and coordination)  Within normal limits       **Overall Severity Rating: WNL  B. SWALLOW INITIATION/REFLEX: (The reflex is normal if "triggered" by the time the bolus reached the base of the tongue) Within Normal Limits  **Overall Severity Rating: WNL  C. PHARYNGEAL PHASE: (Pharyngeal function is normal if the bolus shows rapid, smooth, and continuous transit through the pharynx and there is no pharyngeal residue after the swallow) Within normal limits  **Overall Severity Rating: WNL  D. LARYNGEAL PENETRATION: (Material entering into the laryngeal inlet/vestibule but not aspirated) None  E. ASPIRATION: None  F. ESOPHAGEAL PHASE: (Screening of the upper esophagus) No observed abnormalities with the thin liquid bolus through the mid-esophagus  ASSESSMENT: This 69 year old woman; with swallowing difficultly described as coughing with eating and drinking; is presenting with functional oropharyngeal swallowing.  Oral control of the bolus including oral hold, rotary mastication, and anterior to posterior transfer is within normal limits.  Timing of the pharyngeal swallow is within normal limits.  Pharyngeal aspects of the swallow including hyolaryngeal excursion, pharyngeal pressure generation, duration/amplitude of UES opening, and laryngeal vestibule closure at the height of the swallow are within normal limits.  There is no observed pharyngeal residue, laryngeal penetration, or tracheal aspiration.  A barium tablet moved rapidly through the pharynx into the esophagus.  The patient is not at risk for prandial aspiration  and aspiration does not appear to be the cause of her coughing.  The patient stated that the coughing typically does not begin until after she has been eating, indicating a potential esophageal component.  PLAN/RECOMMENDATIONS:  A. Diet: Regular   B. Swallowing Precautions: Standard   C.  Recommended consultation to: follow up with GI as scheduled   D. Therapy recommendations N/A   E. Results and recommendations were discussed with the patient immediately following the study                    and final report routed to referring practioner.          G-Codes - 05-Mar-2015 1628    Functional Assessment Tool Used MBS   Functional Limitations Swallowing   Swallow Current Status (M0947) 0 percent impaired, limited or restricted   Swallow Goal Status (S9628) 0 percent impaired, limited or restricted   Swallow Discharge Status [phone]) 0 percent impaired, limited or restricted          Problem List Patient Active Problem List   Diagnosis Date Noted  . Low back pain 01/01/2015  . Nausea 11/22/2014  . Dysphagia, pharyngoesophageal phase 11/22/2014  . Screening for skin cancer 11/22/2014  . Varicose vein of leg 11/22/2014  . Post herpetic neuralgia 03/06/2014  . Shingles 11/29/2013  . Medicare annual wellness visit, subsequent 10/25/2013  . Pelvic pain 10/25/2013  . Cystocele 10/25/2013  . Skin lesion 10/25/2013  . Unspecified sleep apnea 10/25/2013  . Obesity, unspecified 10/19/2012  . Screening for colon cancer 10/19/2012  . History of breast cancer 02/11/2012  . Hyperlipidemia 05/11/2011  . Osteopenia 05/11/2011  . Depression 03/04/2011  . Hypertension 03/04/2011   Leroy Sea, MS/CCC- SLP  Lou Miner 03/05/2015, 4:29 PM  [facility] [place], [place], [address] Phone: [phone]   Fax:

## 2015-02-23 ENCOUNTER — Other Ambulatory Visit: Payer: Self-pay | Admitting: Internal Medicine

## 2015-02-26 NOTE — Telephone Encounter (Signed)
Okay to refill? 

## 2015-03-25 ENCOUNTER — Ambulatory Visit (INDEPENDENT_AMBULATORY_CARE_PROVIDER_SITE_OTHER): Payer: PPO | Admitting: Family Medicine

## 2015-03-25 ENCOUNTER — Encounter: Payer: Self-pay | Admitting: Family Medicine

## 2015-03-25 ENCOUNTER — Ambulatory Visit
Admission: RE | Admit: 2015-03-25 | Discharge: 2015-03-25 | Disposition: A | Discharge status: Home / Self Care | Payer: PPO | Source: Ambulatory Visit | Attending: Family Medicine | Admitting: Family Medicine

## 2015-03-25 ENCOUNTER — Telehealth: Payer: Self-pay

## 2015-03-25 VITALS — BP 132/80 | HR 93 | Temp 98.7°F | Wt 208.8 lb

## 2015-03-25 DIAGNOSIS — R509 Fever, unspecified: Secondary | ICD-10-CM | POA: Insufficient documentation

## 2015-03-25 DIAGNOSIS — R05 Cough: Secondary | ICD-10-CM | POA: Insufficient documentation

## 2015-03-25 DIAGNOSIS — R059 Cough, unspecified: Secondary | ICD-10-CM

## 2015-03-25 MED ORDER — DOXYCYCLINE HYCLATE 100 MG PO TABS: 100.0000 mg | ORAL_TABLET | Freq: Two times a day (BID) | ORAL | Status: DC

## 2015-03-25 MED ORDER — BENZONATATE 100 MG PO CAPS: 100.0000 mg | ORAL_CAPSULE | Freq: Two times a day (BID) | ORAL | Status: DC | PRN

## 2015-03-25 MED ORDER — AMOXICILLIN-POT CLAVULANATE ER 1000-62.5 MG PO TB12: 2.0000 | ORAL_TABLET | Freq: Two times a day (BID) | ORAL | Status: DC

## 2015-03-25 MED ORDER — AMOXICILLIN 500 MG PO TABS: 1000.0000 mg | ORAL_TABLET | Freq: Three times a day (TID) | ORAL | Status: DC

## 2015-03-25 NOTE — Assessment & Plan Note (Addendum)
Patient with 1-2 days of productive cough with associated chest congestion, sinus congestion, and fever. Patient with scattered crackles and wheezes on exam. Wheezes are minimal. She appears nontoxic. Her vital signs are stable. She is afebrile. This could be pneumonia, bronchitis, or sinusitis, though given the fever yesterday and the abnormal lung sounds we will treat her for community-acquired pneumonia in addition to covering for sinusitis. We'll start her on doxycycline twice a day for 7 days and Augmentin XR twice a day for 7 days. She will use her albuterol inhaler every 4 hours for the next 2 days and then every 4 hours as needed. We will obtain chest x-ray today. Tessalon for cough. I did discuss treating her with prednisone given her history of asthma though the patient declined this. I also discussed covering her with a respiratory fluoroquinolone and outlined the risks of this medicine and she declined that as well. Given return precautions.

## 2015-03-25 NOTE — Telephone Encounter (Signed)
Pharmacy faxed over that Amoxicillin-clavulanate 1000-62.5 mg is no longer available, please advise?

## 2015-03-25 NOTE — Telephone Encounter (Signed)
Spoke with the patient, reviewed new medication with her. Verbalized understanding.

## 2015-03-25 NOTE — Telephone Encounter (Signed)
Amoxicillin 1 g 3 times a day for 7 days was sent to the pharmacy to take the place of the Augmentin. Please inform patient and pharmacy of this change. Patient should take the doxycycline as well. Thank you.

## 2015-03-25 NOTE — Progress Notes (Signed)
Patient ID: Dana Obrien, female   DOB: 04/17/1946, 69 y.o.   MRN: [id number]  [name], MD Phone: [phone]  Dana Obrien is a 69 y.o. female who presents today for a same day visit.  Cough: Patient notes symptoms started with chills on Saturday evening. This is followed by development of chest and sinus congestion. She states they're equally congested. She then developed cough. She is coughing up yellow/green material. She is not blowing anything out of her nose. She does note some ear fullness. She denies chest pain and shortness of breath. She does note some mild soreness in her lower ribs bilaterally in the lateral aspect related to cough. She's been taking Robitussin for cough with some benefit. She does note she had a fever last night of 100.8 F. She is also been using her albuterol inhaler twice a day since this started and this helped loosen up the chest congestion.  PMH: nonsmoker. She has a history of asthma.   ROS see history of present illness  Objective  Physical Exam Filed Vitals:   03/25/15 0921  BP: 132/80  Pulse: 93  Temp: 98.7 F (37.1 C)    Physical Exam  Constitutional:  No acute distress, nontoxic  HENT:  Head: Normocephalic and atraumatic.  Right Ear: External ear normal.  Left Ear: External ear normal.  Posterior oropharynx with mild erythema, no exudate, no tonsillar swelling  Eyes: Conjunctivae are normal. Pupils are equal, round, and reactive to light.  Neck: Neck supple.  Cardiovascular: Normal rate, regular rhythm and normal heart sounds.  Exam reveals no gallop and no friction rub.   No murmur heard. Pulmonary/Chest: Effort normal. No respiratory distress. She has wheezes (animal scattered wheezes).  Scattered crackles in bilateral lower lung fields right greater than left, there is minimal tenderness of the bilateral midaxillary lower ribs with no bony defects noted  Musculoskeletal: She exhibits no edema.  Lymphadenopathy:    She has no cervical adenopathy.  Neurological: She is alert. Gait normal.  Skin: Skin is warm and dry. She is not diaphoretic.     Assessment/Plan: Please see individual problem list.  Cough Patient with 1-2 days of productive cough with associated chest congestion, sinus congestion, and fever. Patient with scattered crackles and wheezes on exam. Wheezes are minimal. She appears nontoxic. Her vital signs are stable. She is afebrile. This could be pneumonia, bronchitis, or sinusitis, though given the fever yesterday and the abnormal lung sounds we will treat her for community-acquired pneumonia in addition to covering for sinusitis. We'll start her on doxycycline twice a day for 7 days and Augmentin XR twice a day for 7 days. She will use her albuterol inhaler every 4 hours for the next 2 days and then every 4 hours as needed. We will obtain chest x-ray today. Tessalon for cough. I did discuss treating her with prednisone given her history of asthma though the patient declined this. I also discussed covering her with a respiratory fluoroquinolone and outlined the risks of this medicine and she declined that as well. Given return precautions.     Orders Placed This Encounter  Procedures  . DG Chest 2 View    Standing Status: Future     Number of Occurrences: 1     Standing Expiration Date: 05/24/2016    Order Specific Question:  Reason for Exam (SYMPTOM  OR DIAGNOSIS REQUIRED)    Answer:  cough, chest congestion, fever    Order Specific Question:  Preferred imaging location?  Answer:  [facility]

## 2015-03-25 NOTE — Addendum Note (Signed)
Addended by: Leone Haven on: 03/25/2015 04:36 PM   Modules accepted: Orders, Medications

## 2015-03-25 NOTE — Patient Instructions (Signed)
Nice to meet you. We will treat you with augmentin and doxycycline for possible pneumonia and sinusitis.  Please go get the chest x-ray. Please use your albuterol inhaler every 4 hours for the next 2 days then every 4 hours as needed.  If you develop chest pain, shortness of breath, fevers, blood in cough sputum, feel poorly, palpitations, decreased urine output, or decreased activity level please seek medical attention.

## 2015-03-25 NOTE — Progress Notes (Signed)
Pre visit review using our clinic review tool, if applicable. No additional management support is needed unless otherwise documented below in the visit note. 

## 2015-03-28 ENCOUNTER — Encounter: Payer: Self-pay | Admitting: Family Medicine

## 2015-03-29 ENCOUNTER — Telehealth: Payer: Self-pay

## 2015-03-29 NOTE — Telephone Encounter (Signed)
Is she feeling better? Does she have any shortness of breath or chest pain with this? Does she have any fever? If she has any of these symptoms she should be re-evaluated. Unfortunately other cough medications can not be called in. If she needs additional cough medication she will need to be evaluated again especially since her cough has increased.

## 2015-03-29 NOTE — Telephone Encounter (Signed)
Spoke with patient, she would rather come back to see you Monday.  Scheduled

## 2015-03-29 NOTE — Telephone Encounter (Signed)
Patient called the triage line, her cough has increased.  She has been taking teslon peerles and they are not helping.  Can you advise?

## 2015-04-01 ENCOUNTER — Ambulatory Visit (INDEPENDENT_AMBULATORY_CARE_PROVIDER_SITE_OTHER): Payer: PPO | Admitting: Family Medicine

## 2015-04-01 ENCOUNTER — Encounter: Payer: Self-pay | Admitting: Family Medicine

## 2015-04-01 VITALS — BP 116/74 | HR 92 | Temp 98.5°F | Wt 208.6 lb

## 2015-04-01 DIAGNOSIS — R05 Cough: Secondary | ICD-10-CM | POA: Diagnosis not present

## 2015-04-01 DIAGNOSIS — R059 Cough, unspecified: Secondary | ICD-10-CM

## 2015-04-01 MED ORDER — LORATADINE-PSEUDOEPHEDRINE ER 5-120 MG PO TB12: 1.0000 | ORAL_TABLET | Freq: Two times a day (BID) | ORAL | Status: DC

## 2015-04-01 MED ORDER — HYDROCODONE-HOMATROPINE 5-1.5 MG/5ML PO SYRP: 5.0000 mL | ORAL_SOLUTION | Freq: Four times a day (QID) | ORAL | Status: DC | PRN

## 2015-04-01 NOTE — Progress Notes (Signed)
Pre visit review using our clinic review tool, if applicable. No additional management support is needed unless otherwise documented below in the visit note. 

## 2015-04-01 NOTE — Assessment & Plan Note (Addendum)
Patient overall is improved at this time, though does continues to have cough and postnasal drip. She notes her sinus congestion has improved though still mildly present. Suspect cough is most likely related to post viral/bacterial infection cough versus allergic rhinitis and postnasal drip cough. She is well appearing on exam and her vitals are stable. Her oxygen saturation is normal. Lung exam is benign and given stable vital signs unlikely to be a pulmonary process. Discussed further management of this issue with the patient. I do not think at this time that she needs an additional antibiotic given that her sinus congestion has improved and she has been afebrile and has normal lung sounds. We will treat her for allergic component with Claritin-D and Flonase and we'll supply a cough suppressant to help with the cough. I did discuss that she needs to watch her blood pressure while being on the Claritin-D as this can cause issues with blood pressure. If she has worsening of symptoms after stopping the antibiotic or does not continue to improve over the next several days she'll follow-up in the office for reevaluation. She is given return precautions.

## 2015-04-01 NOTE — Progress Notes (Signed)
Patient ID: Dana Obrien, female   DOB: 10/10/1945, 69 y.o.   MRN: [id number]  [name], MD Phone: [phone]  Dana Obrien is a 69 y.o. female who presents today for follow-up.  Cough: Patient notes that she has continued to have cough. She notes her sinus congestion has been improving. She does note a little bit of nasal congestion. She notes postnasal drip that is her biggest complaint today along with a cough. The cough is productive of yellowish mucus. She is blowing yellowish mucus out of her nose. She's finished the doxycycline and has several doses of amoxicillin left. She has no fevers, shortness of breath, or chest pain with this. She has not been wheezing. The Tessalon did not help with her cough. She's been taking Mucinex and Robitussin with little benefit. She has been using her albuterol inhaler and this helps break up the mucus, though she coughs more after using it. She does have a history of allergies and has been taking Flonase throughout this. She's not taking any oral antihistamines, though she has taken Claritin-D in the past with no issues.  PMH: nonsmoker   ROS see history of present illness  Objective  Physical Exam Filed Vitals:   04/01/15 0918  BP: 116/74  Pulse: 92  Temp: 98.5 F (36.9 C)    Physical Exam  Constitutional: She is well-developed, well-nourished, and in no distress.  HENT:  Head: Normocephalic and atraumatic.  Right Ear: External ear normal.  Left Ear: External ear normal.  Normal TMs bilaterally, posterior oropharynx with postnasal drip noted mild erythema, no exudate  Eyes: Conjunctivae are normal. Pupils are equal, round, and reactive to light.  Neck: Neck supple.  Cardiovascular: Normal rate, regular rhythm and normal heart sounds.  Exam reveals no gallop and no friction rub.   No murmur heard. Pulmonary/Chest: Effort normal and breath sounds normal. No respiratory distress. She has no wheezes. She has no rales.    Lymphadenopathy:    She has no cervical adenopathy.  Neurological: She is alert. Gait normal.  Skin: Skin is warm and dry. She is not diaphoretic.     Assessment/Plan: Please see individual problem list.  Cough Patient overall is improved at this time, though does continues to have cough and postnasal drip. She notes her sinus congestion has improved though still mildly present. Suspect cough is most likely related to post viral/bacterial infection cough versus allergic rhinitis and postnasal drip cough. She is well appearing on exam and her vitals are stable. Her oxygen saturation is normal. Lung exam is benign and given stable vital signs unlikely to be a pulmonary process. Discussed further management of this issue with the patient. I do not think at this time that she needs an additional antibiotic given that her sinus congestion has improved and she has been afebrile and has normal lung sounds. We will treat her for allergic component with Claritin-D and Flonase and we'll supply a cough suppressant to help with the cough. I did discuss that she needs to watch her blood pressure while being on the Claritin-D as this can cause issues with blood pressure. If she has worsening of symptoms after stopping the antibiotic or does not continue to improve over the next several days she'll follow-up in the office for reevaluation. She is given return precautions.    Meds ordered this encounter  Medications  . HYDROcodone-homatropine (HYCODAN) 5-1.5 MG/5ML syrup    Sig: Take 5 mLs by mouth every 6 (six) hours as needed for  cough.    Dispense:  120 mL    Refill:  0  . loratadine-pseudoephedrine (CLARITIN-D 12-HOUR) 5-120 MG tablet    Sig: Take 1 tablet by mouth 2 (two) times daily.    Dispense:  10 tablet    Refill:  0    [name]

## 2015-04-01 NOTE — Patient Instructions (Signed)
Nice to see you. Please use the cough suppressant as directed.  You can take claritin-D as well for this.  If you develop fever, shortness of breath, chest pain, increased sinus pressure, cough up blood, feel worse, or develop new symptoms please seek medical attention.  If you worsen or do not continue to improve please return to the office.

## 2015-04-30 ENCOUNTER — Other Ambulatory Visit: Payer: Self-pay | Admitting: Physical Medicine and Rehabilitation

## 2015-04-30 DIAGNOSIS — M5416 Radiculopathy, lumbar region: Secondary | ICD-10-CM | POA: Insufficient documentation

## 2015-04-30 DIAGNOSIS — M5136 Other intervertebral disc degeneration, lumbar region: Secondary | ICD-10-CM

## 2015-04-30 DIAGNOSIS — M62830 Muscle spasm of back: Secondary | ICD-10-CM | POA: Insufficient documentation

## 2015-04-30 DIAGNOSIS — M51369 Other intervertebral disc degeneration, lumbar region without mention of lumbar back pain or lower extremity pain: Secondary | ICD-10-CM | POA: Insufficient documentation

## 2015-05-13 ENCOUNTER — Ambulatory Visit (INDEPENDENT_AMBULATORY_CARE_PROVIDER_SITE_OTHER): Payer: PPO | Admitting: Family Medicine

## 2015-05-13 ENCOUNTER — Encounter: Payer: Self-pay | Admitting: Family Medicine

## 2015-05-13 VITALS — BP 126/80 | HR 85 | Temp 98.4°F | Ht 64.0 in | Wt 204.5 lb

## 2015-05-13 DIAGNOSIS — J0110 Acute frontal sinusitis, unspecified: Secondary | ICD-10-CM

## 2015-05-13 MED ORDER — AMOXICILLIN-POT CLAVULANATE 875-125 MG PO TABS: 1.0000 | ORAL_TABLET | Freq: Two times a day (BID) | ORAL | Status: DC

## 2015-05-13 NOTE — Progress Notes (Signed)
Patient ID: Dana Obrien, female   DOB: 1945/07/01, 69 y.o.   MRN: [id number]  [name], MD Phone: [phone]  Dana Obrien is a 69 y.o. female who presents today for same-day visit.  Action: Patient notes 3 days of significant frontal and maxillary sinus pressure. She notes minimal cough and is able to get mild amount of green material up. She has had postnasal drip. She notes her ears feel full. She had a temperature to 100.64F last night. She was previously treated for bronchitis and sinusitis with doxycycline and amoxicillin. This was a month and half ago. She notes she improved to her baseline following this. Her sick contact is her grandson. She's been taking Claritin D with minimal benefit. She notes this kept her awake last night. She denies headache, numbness, weakness, and vision changes. She is not having any shortness of breath.  PMH: nonsmoker.   ROS see history of present illness  Objective  Physical Exam Filed Vitals:   05/13/15 1315  BP: 126/80  Pulse: 85  Temp: 98.4 F (36.9 C)    Physical Exam  Constitutional: She is well-developed, well-nourished, and in no distress.  HENT:  Head: Normocephalic and atraumatic.  Right Ear: External ear normal.  Left Ear: External ear normal.  Mouth/Throat: Oropharynx is clear and moist. No oropharyngeal exudate.  Normal TMs bilaterally, tenderness to percussion of maxillary and frontal sinuses  Eyes: Conjunctivae are normal. Pupils are equal, round, and reactive to light.  Neck: Neck supple.  Cardiovascular: Normal rate, regular rhythm and normal heart sounds.  Exam reveals no gallop and no friction rub.   No murmur heard. Pulmonary/Chest: Effort normal and breath sounds normal. No respiratory distress. She has no wheezes. She has no rales.  Lymphadenopathy:    She has no cervical adenopathy.  Neurological: She is alert. Gait normal.  Skin: Skin is warm and dry. She is not diaphoretic.      Assessment/Plan: Please see individual problem list.  Sinusitis, acute frontal Patient with symptoms consistent with acute frontal and maxillary sinusitis. With fever last night and significant sinus pressure suspect this is bacterial in nature. She was treated for similar symptoms about a month and half ago and these resolved to her baseline. We'll treat her with Augmentin for sinus infection. She'll stop using Claritin-D, we'll stick to Claritin without the decongestant. She is given return precautions.    Meds ordered this encounter  Medications  . amoxicillin-clavulanate (AUGMENTIN) 875-125 MG tablet    Sig: Take 1 tablet by mouth 2 (two) times daily.    Dispense:  14 tablet    Refill:  0    Dragon voice recognition software was used during the dictation process of this note. If any phrases or words seem inappropriate it is likely secondary to the translation process being inefficient.  [name]

## 2015-05-13 NOTE — Progress Notes (Signed)
Pre visit review using our clinic review tool, if applicable. No additional management support is needed unless otherwise documented below in the visit note. 

## 2015-05-13 NOTE — Patient Instructions (Signed)
Nice to see you. Your sinus congestion is likely due to sinus infection. We will treat you with Augmentin. You can take Claritin without the decongestant portion. If you develop shortness of breath, cough productive of blood, chest pain, persistent fevers, or any new or change in symptoms please seek medical attention.

## 2015-05-13 NOTE — Assessment & Plan Note (Signed)
Patient with symptoms consistent with acute frontal and maxillary sinusitis. With fever last night and significant sinus pressure suspect this is bacterial in nature. She was treated for similar symptoms about a month and half ago and these resolved to her baseline. We'll treat her with Augmentin for sinus infection. She'll stop using Claritin-D, we'll stick to Claritin without the decongestant. She is given return precautions.

## 2015-05-20 ENCOUNTER — Ambulatory Visit
Admission: RE | Admit: 2015-05-20 | Discharge: 2015-05-20 | Disposition: A | Discharge status: Home / Self Care | Payer: PPO | Source: Ambulatory Visit | Attending: Physical Medicine and Rehabilitation | Admitting: Physical Medicine and Rehabilitation

## 2015-05-20 DIAGNOSIS — M439 Deforming dorsopathy, unspecified: Secondary | ICD-10-CM | POA: Diagnosis not present

## 2015-05-20 DIAGNOSIS — M5136 Other intervertebral disc degeneration, lumbar region: Secondary | ICD-10-CM | POA: Diagnosis present

## 2015-05-20 DIAGNOSIS — M2548 Effusion, other site: Secondary | ICD-10-CM | POA: Diagnosis not present

## 2015-05-27 ENCOUNTER — Other Ambulatory Visit: Payer: Self-pay | Admitting: Internal Medicine

## 2015-05-28 ENCOUNTER — Other Ambulatory Visit: Payer: Self-pay | Admitting: Internal Medicine

## 2015-05-28 NOTE — Telephone Encounter (Signed)
Patient was seen in June and had the medication last refilled in August of this year. Please advise?

## 2015-05-29 MED ORDER — ALPRAZOLAM 0.5 MG PO TABS: 0.5000 mg | ORAL_TABLET | Freq: Three times a day (TID) | ORAL | Status: DC | PRN

## 2015-06-06 DIAGNOSIS — C44319 Basal cell carcinoma of skin of other parts of face: Secondary | ICD-10-CM | POA: Diagnosis not present

## 2015-06-17 DIAGNOSIS — M5416 Radiculopathy, lumbar region: Secondary | ICD-10-CM | POA: Diagnosis not present

## 2015-06-17 DIAGNOSIS — M5136 Other intervertebral disc degeneration, lumbar region: Secondary | ICD-10-CM | POA: Diagnosis not present

## 2015-07-18 DIAGNOSIS — M5136 Other intervertebral disc degeneration, lumbar region: Secondary | ICD-10-CM | POA: Diagnosis not present

## 2015-07-18 DIAGNOSIS — M5416 Radiculopathy, lumbar region: Secondary | ICD-10-CM | POA: Diagnosis not present

## 2015-08-01 ENCOUNTER — Other Ambulatory Visit: Payer: Self-pay | Admitting: Internal Medicine

## 2015-08-02 NOTE — Telephone Encounter (Signed)
Last OV was in August with you, had acute visit since.  Please advise refill?

## 2015-08-13 DIAGNOSIS — M5416 Radiculopathy, lumbar region: Secondary | ICD-10-CM | POA: Diagnosis not present

## 2015-08-13 DIAGNOSIS — M5136 Other intervertebral disc degeneration, lumbar region: Secondary | ICD-10-CM | POA: Diagnosis not present

## 2015-09-11 DIAGNOSIS — M47816 Spondylosis without myelopathy or radiculopathy, lumbar region: Secondary | ICD-10-CM | POA: Diagnosis not present

## 2015-09-11 DIAGNOSIS — Z6834 Body mass index (BMI) 34.0-34.9, adult: Secondary | ICD-10-CM | POA: Diagnosis not present

## 2015-09-11 DIAGNOSIS — M549 Dorsalgia, unspecified: Secondary | ICD-10-CM | POA: Diagnosis not present

## 2015-09-11 DIAGNOSIS — M419 Scoliosis, unspecified: Secondary | ICD-10-CM | POA: Diagnosis not present

## 2015-09-18 DIAGNOSIS — C44319 Basal cell carcinoma of skin of other parts of face: Secondary | ICD-10-CM | POA: Diagnosis not present

## 2015-10-07 ENCOUNTER — Other Ambulatory Visit: Payer: Self-pay | Admitting: Internal Medicine

## 2015-10-07 MED ORDER — TRETINOIN 0.05 % EX CREA: 1.0000 "application " | TOPICAL_CREAM | Freq: Every day | CUTANEOUS | Status: DC

## 2015-10-07 MED ORDER — ATORVASTATIN CALCIUM 20 MG PO TABS: 20.0000 mg | ORAL_TABLET | Freq: Every day | ORAL | Status: DC

## 2015-10-07 MED ORDER — BUPROPION HCL ER (XL) 300 MG PO TB24: 300.0000 mg | ORAL_TABLET | Freq: Every day | ORAL | Status: DC

## 2015-10-07 NOTE — Telephone Encounter (Signed)
Refilled tretinoin (RETIN-A) 0.05 % cream 45 g 0 03/06/2014   buPROPion (WELLBUTRIN XL) 300 MG 24 hr tablet 90 tablet 0 02/26/2015    please advise patient has not been seen since 12/2014?

## 2015-10-07 NOTE — Telephone Encounter (Signed)
Patient has appointment on 10/17/15 do you want this filled thirty days?

## 2015-10-10 ENCOUNTER — Encounter: Payer: Self-pay | Admitting: Internal Medicine

## 2015-10-17 ENCOUNTER — Ambulatory Visit: Payer: PPO | Admitting: Internal Medicine

## 2015-11-01 ENCOUNTER — Ambulatory Visit (INDEPENDENT_AMBULATORY_CARE_PROVIDER_SITE_OTHER): Payer: PPO | Admitting: Internal Medicine

## 2015-11-01 ENCOUNTER — Ambulatory Visit: Payer: PPO | Admitting: Internal Medicine

## 2015-11-01 ENCOUNTER — Encounter: Payer: Self-pay | Admitting: Internal Medicine

## 2015-11-01 VITALS — BP 106/72 | HR 87 | Ht 64.0 in | Wt 199.6 lb

## 2015-11-01 DIAGNOSIS — E785 Hyperlipidemia, unspecified: Secondary | ICD-10-CM

## 2015-11-01 DIAGNOSIS — M545 Low back pain: Secondary | ICD-10-CM | POA: Diagnosis not present

## 2015-11-01 DIAGNOSIS — N8110 Cystocele, unspecified: Secondary | ICD-10-CM

## 2015-11-01 DIAGNOSIS — IMO0002 Reserved for concepts with insufficient information to code with codable children: Secondary | ICD-10-CM

## 2015-11-01 DIAGNOSIS — R0681 Apnea, not elsewhere classified: Secondary | ICD-10-CM

## 2015-11-01 DIAGNOSIS — G4733 Obstructive sleep apnea (adult) (pediatric): Secondary | ICD-10-CM | POA: Insufficient documentation

## 2015-11-01 DIAGNOSIS — I10 Essential (primary) hypertension: Secondary | ICD-10-CM | POA: Diagnosis not present

## 2015-11-01 LAB — COMPREHENSIVE METABOLIC PANEL
ALT: 21 U/L (ref 6–29)
AST: 22 U/L (ref 10–35)
Albumin: 4.4 g/dL (ref 3.6–5.1)
Alkaline Phosphatase: 78 U/L (ref 33–130)
BUN: 30 mg/dL — AB (ref 7–25)
CHLORIDE: 100 mmol/L (ref 98–110)
CO2: 27 mmol/L (ref 20–31)
CREATININE: 1.12 mg/dL — AB (ref 0.50–0.99)
Calcium: 9.8 mg/dL (ref 8.6–10.4)
GLUCOSE: 118 mg/dL — AB (ref 65–99)
Potassium: 4.2 mmol/L (ref 3.5–5.3)
SODIUM: 139 mmol/L (ref 135–146)
TOTAL PROTEIN: 7.6 g/dL (ref 6.1–8.1)
Total Bilirubin: 0.4 mg/dL (ref 0.2–1.2)

## 2015-11-01 LAB — LIPID PANEL
CHOL/HDL RATIO: 3.4 ratio (ref <=5.0)
CHOLESTEROL: 205 mg/dL — AB (ref 125–200)
HDL: 61 mg/dL (ref >=46)
LDL CALC: 116 mg/dL (ref <130)
Triglycerides: 139 mg/dL (ref <150)
VLDL: 28 mg/dL (ref <30)

## 2015-11-01 NOTE — Patient Instructions (Addendum)
We will set up evaluation with Urogynecology to follow up and discuss possible repair of bladder and hysterectomy.   We will also set up a sleep study.

## 2015-11-01 NOTE — Progress Notes (Signed)
Subjective:    Patient ID: Dana Obrien, female    DOB: 09-15-1945, 70 y.o.   MRN: [id number]  HPI  70YO female presents for follow up.  HTN - BP has been well controlled. Compliant with medication.  Back pain - Seen by Dr. Sharlet Salina. Had ESI x 2. No improvement. Seen by Dr. Patrice Paradise in [place]. Diagnosed with DJD. Started on Celebrex. Some improvement noted with this after about 6 weeks. Now back to doing normal activities. Follow up pending.  Last few weeks, has had some blood on tissue paper. Cannot tell where this is coming from. Stopped in last week or so. Think this is coming from urine. Known to have polyps in bladder with bleeding.  Apnea - Daughter noted her snoring and having apneic spells when they were together at the beach. She would like to have a sleep study. Feels tired during day.  Wt Readings from Last 3 Encounters:  11/01/15 199 lb 9.6 oz (90.538 kg)  05/13/15 204 lb 8 oz (92.761 kg)  04/01/15 208 lb 9.6 oz (94.62 kg)   BP Readings from Last 3 Encounters:  11/01/15 106/72  05/13/15 126/80  04/01/15 116/74    Past Medical History  Diagnosis Date  . Hypertension   . Migraine   . Cancer [facility]) 2004    right lumpectomy, chemotherapy and radiation Dr. Jeb Levering  . Depression   . Vitamin D deficiency     IN THE PAST   Family History  Problem Relation Age of Onset  . Cancer Sister     COLON   Past Surgical History  Procedure Laterality Date  . Breast excisional biopsy Right 2004    chemo and rad  . Breast biopsy Right     stereo prior to lumpectomy   Social History   Social History  . Marital Status: Legally Separated    Spouse Name: N/A  . Number of Children: N/A  . Years of Education: N/A   Social History Main Topics  . Smoking status: Never Smoker   . Smokeless tobacco: Never Used  . Alcohol Use: Yes     Comment: rarely  . Drug Use: No  . Sexual Activity: Not Asked   Other Topics Concern  . None   Social History Narrative   Daily  Caffeine Use:  1-2 coffee   Regular Exercise -  NO    Review of Systems  Constitutional: Positive for fatigue. Negative for fever, chills, appetite change and unexpected weight change.  Eyes: Negative for visual disturbance.  Respiratory: Positive for apnea. Negative for shortness of breath.   Cardiovascular: Negative for chest pain and leg swelling.  Gastrointestinal: Negative for nausea, vomiting, abdominal pain, diarrhea, constipation and blood in stool.  Musculoskeletal: Negative for myalgias, back pain and arthralgias.  Skin: Negative for color change and rash.  Hematological: Negative for adenopathy. Does not bruise/bleed easily.  Psychiatric/Behavioral: Negative for sleep disturbance and dysphoric mood. The patient is not nervous/anxious.        Objective:    BP 106/72 mmHg  Pulse 87  Ht 5\' 4"  (1.626 m)  Wt 199 lb 9.6 oz (90.538 kg)  BMI 34.24 kg/m2  SpO2 97% Physical Exam  Constitutional: She is oriented to person, place, and time. She appears well-developed and well-nourished. No distress.  HENT:  Head: Normocephalic and atraumatic.  Right Ear: External ear normal.  Left Ear: External ear normal.  Nose: Nose normal.  Mouth/Throat: Oropharynx is clear and moist. No oropharyngeal exudate.  Eyes: Conjunctivae  are normal. Pupils are equal, round, and reactive to light. Right eye exhibits no discharge. Left eye exhibits no discharge. No scleral icterus.  Neck: Normal range of motion. Neck supple. No tracheal deviation present. No thyromegaly present.  Cardiovascular: Normal rate, regular rhythm, normal heart sounds and intact distal pulses.  Exam reveals no gallop and no friction rub.   No murmur heard. Pulmonary/Chest: Effort normal and breath sounds normal. No respiratory distress. She has no wheezes. She has no rales. She exhibits no tenderness.  Musculoskeletal: Normal range of motion. She exhibits no edema or tenderness.  Lymphadenopathy:    She has no cervical  adenopathy.  Neurological: She is alert and oriented to person, place, and time. No cranial nerve deficit. She exhibits normal muscle tone. Coordination normal.  Skin: Skin is warm and dry. No rash noted. She is not diaphoretic. No erythema. No pallor.  Psychiatric: She has a normal mood and affect. Her behavior is normal. Judgment and thought content normal.          Assessment & Plan:   Problem List Items Addressed This Visit      Unprioritized   Cystocele    Recent recurrent bleeding from urethra. Reviewed previous notes from Urogynecology. Will set up follow up with them for possible repair of bladder prolapse and hysterectomy.      Relevant Orders   Ambulatory referral to Urogynecology   Hyperlipidemia    Will check lipids and LFTs with labs today. Continue Atorvastatin.      Relevant Orders   Comprehensive metabolic panel   Lipid panel   Hypertension - Primary    BP Readings from Last 3 Encounters:  11/01/15 106/72  05/13/15 126/80  04/01/15 116/74   BP well controlled. Renal function with labs today. Continue current medications.      Low back pain    Secondary to DJD. Symptoms improved with addition of Celebrex. Will continue.      Relevant Medications   celecoxib (CELEBREX) 100 MG capsule   traMADol (ULTRAM) 50 MG tablet   Witnessed apneic spells    Recent episodes of apneic spells during sleep and daytime fatigue. Will set up sleep study for evaluation of OSA.      Relevant Orders   Ambulatory referral to Sleep Studies       Return in about 3 months (around 02/01/2016) for Recheck.  Ronette Deter, MD Internal Medicine [facility]

## 2015-11-01 NOTE — Assessment & Plan Note (Signed)
BP Readings from Last 3 Encounters:  11/01/15 106/72  05/13/15 126/80  04/01/15 116/74   BP well controlled. Renal function with labs today. Continue current medications.

## 2015-11-01 NOTE — Assessment & Plan Note (Signed)
Recent recurrent bleeding from urethra. Reviewed previous notes from Urogynecology. Will set up follow up with them for possible repair of bladder prolapse and hysterectomy.

## 2015-11-01 NOTE — Assessment & Plan Note (Addendum)
Secondary to DJD. Symptoms improved with addition of Celebrex. Will continue.

## 2015-11-01 NOTE — Assessment & Plan Note (Signed)
Recent episodes of apneic spells during sleep and daytime fatigue. Will set up sleep study for evaluation of OSA.

## 2015-11-01 NOTE — Assessment & Plan Note (Signed)
Will check lipids and LFTs with labs today. Continue Atorvastatin. 

## 2015-11-03 ENCOUNTER — Other Ambulatory Visit: Payer: Self-pay | Admitting: Internal Medicine

## 2015-11-04 ENCOUNTER — Other Ambulatory Visit: Payer: Self-pay | Admitting: Internal Medicine

## 2015-11-04 MED ORDER — ALPRAZOLAM 0.5 MG PO TABS: 0.5000 mg | ORAL_TABLET | Freq: Three times a day (TID) | ORAL | Status: DC | PRN

## 2015-11-04 NOTE — Telephone Encounter (Signed)
Medication refilled 08/02/15. Last seen 11/01/15. Please advise?

## 2015-11-11 DIAGNOSIS — G4733 Obstructive sleep apnea (adult) (pediatric): Secondary | ICD-10-CM | POA: Diagnosis not present

## 2015-11-14 ENCOUNTER — Other Ambulatory Visit: Payer: Self-pay | Admitting: Internal Medicine

## 2015-11-14 DIAGNOSIS — N950 Postmenopausal bleeding: Secondary | ICD-10-CM | POA: Diagnosis not present

## 2015-11-14 DIAGNOSIS — N814 Uterovaginal prolapse, unspecified: Secondary | ICD-10-CM | POA: Diagnosis not present

## 2015-11-14 DIAGNOSIS — N816 Rectocele: Secondary | ICD-10-CM | POA: Diagnosis not present

## 2015-11-14 DIAGNOSIS — N8110 Cystocele, unspecified: Secondary | ICD-10-CM | POA: Diagnosis not present

## 2015-11-14 DIAGNOSIS — N924 Excessive bleeding in the premenopausal period: Secondary | ICD-10-CM | POA: Diagnosis not present

## 2015-11-14 DIAGNOSIS — I10 Essential (primary) hypertension: Secondary | ICD-10-CM | POA: Diagnosis not present

## 2015-11-14 DIAGNOSIS — R350 Frequency of micturition: Secondary | ICD-10-CM | POA: Diagnosis not present

## 2015-11-14 DIAGNOSIS — J45909 Unspecified asthma, uncomplicated: Secondary | ICD-10-CM | POA: Diagnosis not present

## 2015-11-18 DIAGNOSIS — R102 Pelvic and perineal pain: Secondary | ICD-10-CM | POA: Diagnosis not present

## 2015-11-18 DIAGNOSIS — R399 Unspecified symptoms and signs involving the genitourinary system: Secondary | ICD-10-CM | POA: Diagnosis not present

## 2015-11-21 DIAGNOSIS — G4733 Obstructive sleep apnea (adult) (pediatric): Secondary | ICD-10-CM | POA: Diagnosis not present

## 2015-11-21 DIAGNOSIS — R102 Pelvic and perineal pain: Secondary | ICD-10-CM | POA: Diagnosis not present

## 2015-11-28 ENCOUNTER — Encounter: Payer: Self-pay | Admitting: Internal Medicine

## 2015-12-05 ENCOUNTER — Telehealth: Payer: Self-pay | Admitting: *Deleted

## 2015-12-05 DIAGNOSIS — N950 Postmenopausal bleeding: Secondary | ICD-10-CM | POA: Diagnosis not present

## 2015-12-05 NOTE — Telephone Encounter (Signed)
Patient stated that a [facility] faxed over a Rx to be signed by Dr. Gilford Rile, pt stated that [facility] has not received the Rx back, she would like a follow up on the Rx Pt contact [phone].

## 2015-12-05 NOTE — Telephone Encounter (Signed)
It has been sent!

## 2015-12-05 NOTE — Telephone Encounter (Signed)
We have not received it as of yet.

## 2015-12-05 NOTE — Telephone Encounter (Signed)
Please advise if this was received.

## 2015-12-10 DIAGNOSIS — M47816 Spondylosis without myelopathy or radiculopathy, lumbar region: Secondary | ICD-10-CM | POA: Diagnosis not present

## 2015-12-10 DIAGNOSIS — M419 Scoliosis, unspecified: Secondary | ICD-10-CM | POA: Diagnosis not present

## 2015-12-10 DIAGNOSIS — Z6834 Body mass index (BMI) 34.0-34.9, adult: Secondary | ICD-10-CM | POA: Diagnosis not present

## 2015-12-12 DIAGNOSIS — N393 Stress incontinence (female) (male): Secondary | ICD-10-CM | POA: Diagnosis not present

## 2015-12-30 DIAGNOSIS — G4733 Obstructive sleep apnea (adult) (pediatric): Secondary | ICD-10-CM | POA: Diagnosis not present

## 2016-01-09 ENCOUNTER — Telehealth: Payer: Self-pay

## 2016-01-09 MED ORDER — ALPRAZOLAM 0.5 MG PO TABS: 0.5000 mg | ORAL_TABLET | Freq: Three times a day (TID) | ORAL | 0 refills | Status: DC | PRN

## 2016-01-09 NOTE — Telephone Encounter (Signed)
Refill given. Please fax to pharmacy. Thanks.

## 2016-01-09 NOTE — Telephone Encounter (Signed)
Done-aa 

## 2016-01-09 NOTE — Telephone Encounter (Signed)
Fax from [facility] for refill request on Xanax. Last filled on 11/06/15 for 90 tablets with no refill. Has appointment to get established with you on 01/15/16.-aa

## 2016-01-15 ENCOUNTER — Encounter: Payer: Self-pay | Admitting: Family Medicine

## 2016-01-15 ENCOUNTER — Ambulatory Visit (INDEPENDENT_AMBULATORY_CARE_PROVIDER_SITE_OTHER): Payer: PPO | Admitting: Family Medicine

## 2016-01-15 VITALS — BP 116/74 | HR 89 | Temp 98.2°F | Wt 205.6 lb

## 2016-01-15 DIAGNOSIS — R0602 Shortness of breath: Secondary | ICD-10-CM | POA: Insufficient documentation

## 2016-01-15 DIAGNOSIS — Z01818 Encounter for other preprocedural examination: Secondary | ICD-10-CM | POA: Diagnosis not present

## 2016-01-15 DIAGNOSIS — R0609 Other forms of dyspnea: Secondary | ICD-10-CM | POA: Insufficient documentation

## 2016-01-15 NOTE — Assessment & Plan Note (Signed)
Suspect this is likely related to deconditioning though given her pending surgery we will have her evaluated by cardiology.

## 2016-01-15 NOTE — Patient Instructions (Signed)
Nice to see you. We will have you evaluated by cardiology given your breathing issues prior to years surgery. We'll check some lab work today and call you with the results. If you develop chest pain, persistent shortness of breath, or any new or changing symptoms please seek medical attention.

## 2016-01-15 NOTE — Progress Notes (Signed)
[name], MD Phone: [phone]  Dana Obrien is a 70 y.o. female who presents today for follow-up.  Patient presents for surgical clearance today. She is scheduled to have gynecologic surgery to repair a prolapsed rectum, uterus, and bladder. They're going to do a hysterectomy and BSO as well. Scheduled for September 5. Patient does report some shortness of breath with walking up 1-2 flights of stairs. Also shortness of breath with walking around the block. Notes this does improve the more she is able to get out and exercise though occurs fairly easily. No chest pain. No diaphoresis with this. Possibly some orthopnea though she is unsure if this is related to her wearing her CPAP mask. She does have sleep apnea and has been wearing her CPAP. Notes this does dry out her sinuses though she does wake up more alert and is not drowsy during the day. No history of kidney disease. No history of issues with anesthetic in her or her family. Has never had a heart attack or irregular heartbeat. Never had a stroke. No history of seizures or epilepsy. Does have a history of TMJ. No thyroid dysfunction. No liver disease. No heart failure history. Reports in the past she was advised she had asthma though she is unsure of this and rarely uses Proventil. No history of diabetes. Does have a history of acute bronchitis though no bronchitis issues at this time.  PMH: nonsmoker.   ROS see history of present illness  Objective  Physical Exam Vitals:   01/15/16 1503  BP: 116/74  Pulse: 89  Temp: 98.2 F (36.8 C)    BP Readings from Last 3 Encounters:  01/15/16 116/74  11/01/15 106/72  05/13/15 126/80   Wt Readings from Last 3 Encounters:  01/15/16 205 lb 9.6 oz (93.3 kg)  11/01/15 199 lb 9.6 oz (90.5 kg)  05/13/15 204 lb 8 oz (92.8 kg)    Physical Exam  Constitutional: No distress.  HENT:  Head: Normocephalic and atraumatic.  Mouth/Throat: Oropharynx is clear and moist. No  oropharyngeal exudate.  Eyes: Conjunctivae are normal. Pupils are equal, round, and reactive to light.  Cardiovascular: Normal rate, regular rhythm and normal heart sounds.   Pulmonary/Chest: Effort normal and breath sounds normal.  Abdominal: Soft. Bowel sounds are normal. She exhibits no distension. There is no tenderness. There is no rebound and no guarding.  Musculoskeletal: She exhibits no edema.  Neurological: She is alert. Gait normal.  Skin: Skin is warm and dry. She is not diaphoretic.   EKG: Normal sinus rhythm, rate 79, no ST or T-wave changes  Assessment/Plan: Please see individual problem list.  Pre-op evaluation She presents for preop evaluation prior to gynecologic surgery. She does note some shortness of breath with exertion on going up 1-2 flights of stairs and when walking around the block. No associated chest pain. EKG completed and is reassuring. She will be referred to cardiology for cardiovascular clearance. Medically her blood pressure is under good control. Her sleep apnea is improved with CPAP. She was advised to discuss her sleep apnea and TMJ with the anesthesiologist. We will check lab work as well.  Exertional shortness of breath Suspect this is likely related to deconditioning though given her pending surgery we will have her evaluated by cardiology.   Orders Placed This Encounter  Procedures  . CBC  . Comp Met (CMET)  . Ambulatory referral to Cardiology    Referral Priority:   Routine    Referral Type:   Consultation  Referral Reason:   Specialty Services Required    Requested Specialty:   Cardiology    Number of Visits Requested:   1  . EKG 12-Lead    No orders of the defined types were placed in this encounter.   [name], MD [place]

## 2016-01-15 NOTE — Progress Notes (Signed)
Pre visit review using our clinic review tool, if applicable. No additional management support is needed unless otherwise documented below in the visit note. 

## 2016-01-15 NOTE — Assessment & Plan Note (Addendum)
She presents for preop evaluation prior to gynecologic surgery. She does note some shortness of breath with exertion on going up 1-2 flights of stairs and when walking around the block. No associated chest pain. EKG completed and is reassuring. She will be referred to cardiology for cardiovascular clearance. Medically her blood pressure is under good control. Her sleep apnea is improved with CPAP. She was advised to discuss her sleep apnea and TMJ with the anesthesiologist. We will check lab work as well.

## 2016-01-16 LAB — COMPREHENSIVE METABOLIC PANEL
ALK PHOS: 64 U/L (ref 39–117)
ALT: 17 U/L (ref 0–35)
AST: 18 U/L (ref 0–37)
Albumin: 4.2 g/dL (ref 3.5–5.2)
BUN: 14 mg/dL (ref 6–23)
CHLORIDE: 103 meq/L (ref 96–112)
CO2: 29 mEq/L (ref 19–32)
Calcium: 9.4 mg/dL (ref 8.4–10.5)
Creatinine, Ser: 0.82 mg/dL (ref 0.40–1.20)
GFR: 73.59 mL/min (ref >60.00)
GLUCOSE: 117 mg/dL — AB (ref 70–99)
POTASSIUM: 4.2 meq/L (ref 3.5–5.1)
SODIUM: 140 meq/L (ref 135–145)
TOTAL PROTEIN: 7.3 g/dL (ref 6.0–8.3)
Total Bilirubin: 0.3 mg/dL (ref 0.2–1.2)

## 2016-01-16 LAB — CBC
HEMATOCRIT: 38 % (ref 36.0–46.0)
HEMOGLOBIN: 12.8 g/dL (ref 12.0–15.0)
MCHC: 33.6 g/dL (ref 30.0–36.0)
MCV: 94.1 fl (ref 78.0–100.0)
Platelets: 228 10*3/uL (ref 150.0–400.0)
RBC: 4.04 Mil/uL (ref 3.87–5.11)
RDW: 14.1 % (ref 11.5–15.5)
WBC: 8.3 10*3/uL (ref 4.0–10.5)

## 2016-01-20 ENCOUNTER — Encounter: Payer: Self-pay | Admitting: Family Medicine

## 2016-01-24 ENCOUNTER — Telehealth: Payer: Self-pay | Admitting: Family Medicine

## 2016-01-24 DIAGNOSIS — Z01818 Encounter for other preprocedural examination: Secondary | ICD-10-CM | POA: Diagnosis not present

## 2016-01-24 DIAGNOSIS — Z833 Family history of diabetes mellitus: Secondary | ICD-10-CM | POA: Diagnosis not present

## 2016-01-24 DIAGNOSIS — N816 Rectocele: Secondary | ICD-10-CM | POA: Diagnosis not present

## 2016-01-24 DIAGNOSIS — Z79899 Other long term (current) drug therapy: Secondary | ICD-10-CM | POA: Diagnosis not present

## 2016-01-24 NOTE — Telephone Encounter (Signed)
[place], a provider from [facility] called saying Dr. Caryl Bis was supposed to have sent over; surgical medical clearance for Ms. Dana Obrien from her last appointment. They have yet to receive that. She's wondering when it will be sent over.   [name] ph# [phone] Fax # [phone]  Thank you.

## 2016-01-24 NOTE — Telephone Encounter (Signed)
Tried calling office and no one has picked up.  I can not see any record of it in previous faxed folder on jamies desk.  Please advise.

## 2016-01-27 DIAGNOSIS — R0602 Shortness of breath: Secondary | ICD-10-CM | POA: Diagnosis not present

## 2016-01-27 DIAGNOSIS — G4733 Obstructive sleep apnea (adult) (pediatric): Secondary | ICD-10-CM | POA: Diagnosis not present

## 2016-01-27 DIAGNOSIS — M1990 Unspecified osteoarthritis, unspecified site: Secondary | ICD-10-CM | POA: Diagnosis not present

## 2016-01-27 DIAGNOSIS — I208 Other forms of angina pectoris: Secondary | ICD-10-CM | POA: Diagnosis not present

## 2016-01-27 DIAGNOSIS — M5440 Lumbago with sciatica, unspecified side: Secondary | ICD-10-CM | POA: Diagnosis not present

## 2016-01-27 DIAGNOSIS — Z01818 Encounter for other preprocedural examination: Secondary | ICD-10-CM | POA: Diagnosis not present

## 2016-01-27 DIAGNOSIS — F419 Anxiety disorder, unspecified: Secondary | ICD-10-CM | POA: Diagnosis not present

## 2016-01-27 DIAGNOSIS — E669 Obesity, unspecified: Secondary | ICD-10-CM | POA: Diagnosis not present

## 2016-01-27 DIAGNOSIS — I10 Essential (primary) hypertension: Secondary | ICD-10-CM | POA: Diagnosis not present

## 2016-01-27 DIAGNOSIS — E785 Hyperlipidemia, unspecified: Secondary | ICD-10-CM | POA: Diagnosis not present

## 2016-01-27 NOTE — Telephone Encounter (Signed)
Patients needs clearance from cardiology.

## 2016-01-27 NOTE — Telephone Encounter (Signed)
Patient is scheduled to see Cardiology for clearance. I have called and left a message on the [facility] line about this.

## 2016-01-30 DIAGNOSIS — R0602 Shortness of breath: Secondary | ICD-10-CM | POA: Diagnosis not present

## 2016-01-30 DIAGNOSIS — G4733 Obstructive sleep apnea (adult) (pediatric): Secondary | ICD-10-CM | POA: Diagnosis not present

## 2016-01-30 DIAGNOSIS — Z01818 Encounter for other preprocedural examination: Secondary | ICD-10-CM | POA: Diagnosis not present

## 2016-01-30 DIAGNOSIS — I208 Other forms of angina pectoris: Secondary | ICD-10-CM | POA: Diagnosis not present

## 2016-01-31 DIAGNOSIS — F419 Anxiety disorder, unspecified: Secondary | ICD-10-CM | POA: Diagnosis not present

## 2016-01-31 DIAGNOSIS — G4733 Obstructive sleep apnea (adult) (pediatric): Secondary | ICD-10-CM | POA: Diagnosis not present

## 2016-01-31 DIAGNOSIS — Z01818 Encounter for other preprocedural examination: Secondary | ICD-10-CM | POA: Diagnosis not present

## 2016-01-31 DIAGNOSIS — I208 Other forms of angina pectoris: Secondary | ICD-10-CM | POA: Diagnosis not present

## 2016-01-31 DIAGNOSIS — I10 Essential (primary) hypertension: Secondary | ICD-10-CM | POA: Diagnosis not present

## 2016-01-31 DIAGNOSIS — E669 Obesity, unspecified: Secondary | ICD-10-CM | POA: Diagnosis not present

## 2016-01-31 DIAGNOSIS — M1990 Unspecified osteoarthritis, unspecified site: Secondary | ICD-10-CM | POA: Diagnosis not present

## 2016-01-31 DIAGNOSIS — R0602 Shortness of breath: Secondary | ICD-10-CM | POA: Diagnosis not present

## 2016-01-31 DIAGNOSIS — E785 Hyperlipidemia, unspecified: Secondary | ICD-10-CM | POA: Diagnosis not present

## 2016-01-31 DIAGNOSIS — M5440 Lumbago with sciatica, unspecified side: Secondary | ICD-10-CM | POA: Diagnosis not present

## 2016-02-04 DIAGNOSIS — N888 Other specified noninflammatory disorders of cervix uteri: Secondary | ICD-10-CM | POA: Diagnosis not present

## 2016-02-04 DIAGNOSIS — N814 Uterovaginal prolapse, unspecified: Secondary | ICD-10-CM | POA: Diagnosis not present

## 2016-02-04 DIAGNOSIS — N72 Inflammatory disease of cervix uteri: Secondary | ICD-10-CM | POA: Diagnosis not present

## 2016-02-04 DIAGNOSIS — G4730 Sleep apnea, unspecified: Secondary | ICD-10-CM | POA: Diagnosis not present

## 2016-02-04 DIAGNOSIS — Z885 Allergy status to narcotic agent status: Secondary | ICD-10-CM | POA: Diagnosis not present

## 2016-02-04 DIAGNOSIS — N393 Stress incontinence (female) (male): Secondary | ICD-10-CM | POA: Diagnosis not present

## 2016-02-04 DIAGNOSIS — J45909 Unspecified asthma, uncomplicated: Secondary | ICD-10-CM | POA: Diagnosis not present

## 2016-02-04 DIAGNOSIS — N812 Incomplete uterovaginal prolapse: Secondary | ICD-10-CM | POA: Diagnosis not present

## 2016-02-04 DIAGNOSIS — I10 Essential (primary) hypertension: Secondary | ICD-10-CM | POA: Diagnosis not present

## 2016-02-07 ENCOUNTER — Other Ambulatory Visit: Payer: Self-pay | Admitting: Surgical

## 2016-02-07 MED ORDER — LOSARTAN POTASSIUM-HCTZ 100-25 MG PO TABS: 1.0000 | ORAL_TABLET | Freq: Every day | ORAL | 3 refills | Status: DC

## 2016-02-07 MED ORDER — FLUOXETINE HCL 20 MG PO CAPS: ORAL_CAPSULE | ORAL | 1 refills | Status: DC

## 2016-02-07 NOTE — Progress Notes (Signed)
Sent to pharmacy 

## 2016-02-07 NOTE — Progress Notes (Signed)
Can we refill this? 

## 2016-02-11 ENCOUNTER — Ambulatory Visit: Payer: PPO | Admitting: Cardiology

## 2016-02-29 DIAGNOSIS — G4733 Obstructive sleep apnea (adult) (pediatric): Secondary | ICD-10-CM | POA: Diagnosis not present

## 2016-03-02 DIAGNOSIS — G4733 Obstructive sleep apnea (adult) (pediatric): Secondary | ICD-10-CM | POA: Diagnosis not present

## 2016-03-13 DIAGNOSIS — G4733 Obstructive sleep apnea (adult) (pediatric): Secondary | ICD-10-CM | POA: Diagnosis not present

## 2016-03-13 DIAGNOSIS — E669 Obesity, unspecified: Secondary | ICD-10-CM | POA: Diagnosis not present

## 2016-03-20 DIAGNOSIS — H9202 Otalgia, left ear: Secondary | ICD-10-CM | POA: Diagnosis not present

## 2016-03-20 DIAGNOSIS — J301 Allergic rhinitis due to pollen: Secondary | ICD-10-CM | POA: Diagnosis not present

## 2016-03-20 DIAGNOSIS — H6983 Other specified disorders of Eustachian tube, bilateral: Secondary | ICD-10-CM | POA: Diagnosis not present

## 2016-03-31 DIAGNOSIS — G4733 Obstructive sleep apnea (adult) (pediatric): Secondary | ICD-10-CM | POA: Diagnosis not present

## 2016-04-06 ENCOUNTER — Other Ambulatory Visit: Payer: Self-pay | Admitting: Family Medicine

## 2016-04-06 NOTE — Telephone Encounter (Signed)
Please advise on refill.

## 2016-04-07 NOTE — Telephone Encounter (Signed)
Can you see what this patient takes this medication for? Thanks.

## 2016-04-11 DIAGNOSIS — J0100 Acute maxillary sinusitis, unspecified: Secondary | ICD-10-CM | POA: Diagnosis not present

## 2016-04-13 NOTE — Telephone Encounter (Signed)
Noted. Please faxed to pharmacy.

## 2016-04-13 NOTE — Telephone Encounter (Signed)
Patient called back and stated that Dr. Gilford Rile also prescribed this for anxiety.

## 2016-04-13 NOTE — Telephone Encounter (Signed)
Spoke with patient and she stated that she uses this for sleep. She has tried melatonin OTC without any help. Patient is out of medication. She does not take it TID only at night.

## 2016-04-13 NOTE — Telephone Encounter (Signed)
LM for patient to return call to see what she takes medication for

## 2016-04-14 NOTE — Telephone Encounter (Signed)
Faxed RX 

## 2016-04-25 DIAGNOSIS — J208 Acute bronchitis due to other specified organisms: Secondary | ICD-10-CM | POA: Diagnosis not present

## 2016-04-25 DIAGNOSIS — Z8709 Personal history of other diseases of the respiratory system: Secondary | ICD-10-CM | POA: Diagnosis not present

## 2016-04-30 DIAGNOSIS — G4733 Obstructive sleep apnea (adult) (pediatric): Secondary | ICD-10-CM | POA: Diagnosis not present

## 2016-05-29 ENCOUNTER — Other Ambulatory Visit: Payer: Self-pay | Admitting: Family Medicine

## 2016-05-29 NOTE — Telephone Encounter (Signed)
faxed

## 2016-05-29 NOTE — Telephone Encounter (Signed)
Alprazolam refilled 04/2016 with a 90-day supply and prozac refilled 01/2016. Pt last seen 12/2015. Please advise?

## 2016-05-30 DIAGNOSIS — G4733 Obstructive sleep apnea (adult) (pediatric): Secondary | ICD-10-CM | POA: Diagnosis not present

## 2016-06-30 DIAGNOSIS — G4733 Obstructive sleep apnea (adult) (pediatric): Secondary | ICD-10-CM | POA: Diagnosis not present

## 2016-07-08 ENCOUNTER — Encounter: Payer: Self-pay | Admitting: Family Medicine

## 2016-07-08 ENCOUNTER — Ambulatory Visit (INDEPENDENT_AMBULATORY_CARE_PROVIDER_SITE_OTHER): Payer: PPO | Admitting: Family Medicine

## 2016-07-08 ENCOUNTER — Ambulatory Visit (INDEPENDENT_AMBULATORY_CARE_PROVIDER_SITE_OTHER): Payer: PPO

## 2016-07-08 VITALS — BP 110/70 | HR 84 | Temp 98.5°F | Wt 200.2 lb

## 2016-07-08 DIAGNOSIS — F32A Depression, unspecified: Secondary | ICD-10-CM

## 2016-07-08 DIAGNOSIS — F418 Other specified anxiety disorders: Secondary | ICD-10-CM | POA: Diagnosis not present

## 2016-07-08 DIAGNOSIS — R05 Cough: Secondary | ICD-10-CM

## 2016-07-08 DIAGNOSIS — R059 Cough, unspecified: Secondary | ICD-10-CM | POA: Insufficient documentation

## 2016-07-08 DIAGNOSIS — R051 Acute cough: Secondary | ICD-10-CM | POA: Insufficient documentation

## 2016-07-08 DIAGNOSIS — F419 Anxiety disorder, unspecified: Secondary | ICD-10-CM

## 2016-07-08 DIAGNOSIS — F329 Major depressive disorder, single episode, unspecified: Secondary | ICD-10-CM

## 2016-07-08 MED ORDER — MONTELUKAST SODIUM 10 MG PO TABS: 10.0000 mg | ORAL_TABLET | Freq: Every day | ORAL | 3 refills | Status: DC

## 2016-07-08 MED ORDER — FLUOXETINE HCL 20 MG PO CAPS: ORAL_CAPSULE | ORAL | 1 refills | Status: DC

## 2016-07-08 MED ORDER — ALPRAZOLAM 0.5 MG PO TABS: ORAL_TABLET | ORAL | 1 refills | Status: DC

## 2016-07-08 NOTE — Assessment & Plan Note (Signed)
Mostly anxiety. No depression at this time. No SI or HI. We'll refill her Prozac and Xanax. Offered referral to a therapist though she declined. She'll continue to monitor.

## 2016-07-08 NOTE — Assessment & Plan Note (Signed)
Patient with a persistent cough over the last 3 months. Benign lung exam today. Does have some allergic rhinitis and postnasal drip. No GERD symptoms currently. Could be related to her allergic rhinitis. Could be related to some underlying primary lung issue. We will obtain a chest x-ray. We'll start her on Singulair in addition to her Flonase and Claritin. Could consider PFTs depending on what the chest x-ray reveals. She's given return precautions.

## 2016-07-08 NOTE — Progress Notes (Signed)
Pre visit review using our clinic review tool, if applicable. No additional management support is needed unless otherwise documented below in the visit note. 

## 2016-07-08 NOTE — Progress Notes (Signed)
[name], MD Phone: [phone]  Dana Obrien is a 71 y.o. female who presents today for all up.  Patient notes 3 months of cough and congestion. Was initially treated with Augmentin and then treated with prednisone and doxycycline at the walk-in clinic. Notes she's had persistent symptoms. She has wheezed at night. Occasionally coughs up tan mucus. Some tightness in her sinus passages. Some shortness of breath at times. No chest pain. Does have postnasal drip. Notes no fevers. No smoking history. Using Flonase and Claritin. History of allergies.  Depression/anxiety: She stopped Wellbutrin. Notes this kept her from crying and she really wanted to be able to cry. She's been on Prozac and Xanax. She has had slight increased use of her Xanax recently. Her son-in-law left her daughter and this has been very stressful. She notes no depression. No SI or HI.  PMH: nonsmoker.   ROS see history of present illness  Objective  Physical Exam Vitals:   07/08/16 1316  BP: 110/70  Pulse: 84  Temp: 98.5 F (36.9 C)    BP Readings from Last 3 Encounters:  07/08/16 110/70  01/15/16 116/74  11/01/15 106/72   Wt Readings from Last 3 Encounters:  07/08/16 200 lb 3.2 oz (90.8 kg)  01/15/16 205 lb 9.6 oz (93.3 kg)  11/01/15 199 lb 9.6 oz (90.5 kg)    Physical Exam  Constitutional: No distress.  HENT:  Head: Normocephalic and atraumatic.  Mouth/Throat: Oropharynx is clear and moist. No oropharyngeal exudate.  Eyes: Conjunctivae are normal. Pupils are equal, round, and reactive to light.  Cardiovascular: Normal rate, regular rhythm and normal heart sounds.   Pulmonary/Chest: Effort normal and breath sounds normal.  Neurological: She is alert. Gait normal.  Skin: Skin is warm and dry. She is not diaphoretic.  Psychiatric:  Mood anxious, affect anxious     Assessment/Plan: Please see individual problem list.  Cough Patient with a persistent cough over the last 3 months.  Benign lung exam today. Does have some allergic rhinitis and postnasal drip. No GERD symptoms currently. Could be related to her allergic rhinitis. Could be related to some underlying primary lung issue. We will obtain a chest x-ray. We'll start her on Singulair in addition to her Flonase and Claritin. Could consider PFTs depending on what the chest x-ray reveals. She's given return precautions.  Anxiety and depression Mostly anxiety. No depression at this time. No SI or HI. We'll refill her Prozac and Xanax. Offered referral to a therapist though she declined. She'll continue to monitor.   Orders Placed This Encounter  Procedures  . DG Chest 2 View    Standing Status:   Future    Number of Occurrences:   1    Standing Expiration Date:   09/05/2017    Order Specific Question:   Reason for Exam (SYMPTOM  OR DIAGNOSIS REQUIRED)    Answer:   cough for 3 months, intermittent dyspnea, wheezing    Order Specific Question:   Preferred imaging location?    Answer:   [facility] ordered this encounter  Medications  . Loratadine (CLARITIN) 10 MG CAPS    Sig: Take by mouth.  . montelukast (SINGULAIR) 10 MG tablet    Sig: Take 1 tablet (10 mg total) by mouth at bedtime.    Dispense:  30 tablet    Refill:  3  . FLUoxetine (PROZAC) 20 MG capsule    Sig: TAKE THREE CAPSULES BY MOUTH ONCE DAILY  Dispense:  90 capsule    Refill:  1    Please consider 90 day supplies to promote better adherence  . ALPRAZolam (XANAX) 0.5 MG tablet    Sig: TAKE ONE TABLET BY MOUTH AT BEDTIME AS NEEDED FOR ANXIETY OR SLEEP    Dispense:  90 tablet    Refill:  1    [name], MD [place]

## 2016-07-08 NOTE — Patient Instructions (Signed)
Nice to see you. We will start you on Singulair for your upper respiratory congestion. Please continue your Flonase and Claritin. We will get a chest x-ray as well. I refilled your Prozac and Xanax. If you would like to see a therapist please let us know. If you develop cough productive of blood, worsening breathing issues, fevers, thoughts of harming herself or others, or any new or changing symptoms please seek medical attention immediately.

## 2016-07-29 DIAGNOSIS — G4733 Obstructive sleep apnea (adult) (pediatric): Secondary | ICD-10-CM | POA: Diagnosis not present

## 2016-08-26 ENCOUNTER — Telehealth: Payer: Self-pay | Admitting: *Deleted

## 2016-08-26 DIAGNOSIS — J329 Chronic sinusitis, unspecified: Secondary | ICD-10-CM

## 2016-08-26 DIAGNOSIS — R053 Chronic cough: Secondary | ICD-10-CM

## 2016-08-26 DIAGNOSIS — R05 Cough: Secondary | ICD-10-CM

## 2016-08-26 NOTE — Telephone Encounter (Signed)
Please see if patient is using Flonase. If not she should add this. Given persistent symptoms we should consider referral to ENT and ordering PFTs. Thanks.

## 2016-08-26 NOTE — Telephone Encounter (Signed)
Spoke with patient she is using Flonase.   She is having air quaility checked in house.  She previously saw  Dr Magda Kiel  at [facility], patient wanted to make you aware of this.  She prefers to go back to [facility] for referral  to ENT .

## 2016-08-26 NOTE — Telephone Encounter (Signed)
Referral placed. Did she want to proceed with PFTs or just ENT referral?

## 2016-08-26 NOTE — Telephone Encounter (Signed)
  Symptoms: head congestion, chest congestion,  Dry cough,, sometimes productive,  Medications:Singulair , Claritin Last seen for this problem: 07/08/16  , patient not available to come in for appointment today due to no transportation at last office visit you mentioned PFT test please advise

## 2016-08-26 NOTE — Telephone Encounter (Signed)
Yes you can go ahead and set up PFT testing. Thanks.

## 2016-08-26 NOTE — Telephone Encounter (Signed)
Patient has been seen in the office, for congestion. She reported having six sinus infection in the past year. She requested a call to discuss future treatment. Pt contact [phone]

## 2016-08-28 DIAGNOSIS — G4733 Obstructive sleep apnea (adult) (pediatric): Secondary | ICD-10-CM | POA: Diagnosis not present

## 2016-08-31 NOTE — Telephone Encounter (Signed)
Orders placed.

## 2016-09-07 ENCOUNTER — Encounter: Payer: Self-pay | Admitting: Family Medicine

## 2016-09-07 ENCOUNTER — Ambulatory Visit (INDEPENDENT_AMBULATORY_CARE_PROVIDER_SITE_OTHER): Payer: PPO | Admitting: Family Medicine

## 2016-09-07 VITALS — BP 120/80 | HR 80 | Temp 98.1°F | Wt 203.0 lb

## 2016-09-07 DIAGNOSIS — R05 Cough: Secondary | ICD-10-CM | POA: Diagnosis not present

## 2016-09-07 DIAGNOSIS — R0602 Shortness of breath: Secondary | ICD-10-CM | POA: Diagnosis not present

## 2016-09-07 DIAGNOSIS — I10 Essential (primary) hypertension: Secondary | ICD-10-CM | POA: Diagnosis not present

## 2016-09-07 DIAGNOSIS — R0609 Other forms of dyspnea: Secondary | ICD-10-CM

## 2016-09-07 DIAGNOSIS — G4733 Obstructive sleep apnea (adult) (pediatric): Secondary | ICD-10-CM | POA: Diagnosis not present

## 2016-09-07 DIAGNOSIS — R0600 Dyspnea, unspecified: Secondary | ICD-10-CM

## 2016-09-07 DIAGNOSIS — R059 Cough, unspecified: Secondary | ICD-10-CM

## 2016-09-07 DIAGNOSIS — K219 Gastro-esophageal reflux disease without esophagitis: Secondary | ICD-10-CM | POA: Diagnosis not present

## 2016-09-07 LAB — COMPREHENSIVE METABOLIC PANEL
ALBUMIN: 4.2 g/dL (ref 3.5–5.2)
ALK PHOS: 72 U/L (ref 39–117)
ALT: 22 U/L (ref 0–35)
AST: 22 U/L (ref 0–37)
BUN: 15 mg/dL (ref 6–23)
CO2: 32 mEq/L (ref 19–32)
CREATININE: 0.86 mg/dL (ref 0.40–1.20)
Calcium: 9.8 mg/dL (ref 8.4–10.5)
Chloride: 97 mEq/L (ref 96–112)
GFR: 69.52 mL/min (ref >60.00)
Glucose, Bld: 98 mg/dL (ref 70–99)
Potassium: 3.9 mEq/L (ref 3.5–5.1)
SODIUM: 137 meq/L (ref 135–145)
TOTAL PROTEIN: 7.7 g/dL (ref 6.0–8.3)
Total Bilirubin: 0.5 mg/dL (ref 0.2–1.2)

## 2016-09-07 NOTE — Assessment & Plan Note (Signed)
Patient with persistent cough over the last 6 or so months. Benign lung exam. Chest x-ray reassuring. Does have chronic sinusitis and upper respiratory symptoms though this cough did not improve with prednisone and doxycycline that she treated herself with. No GERD symptoms. Has not responded overly well to addition of Singulair. PFTs have been ordered. Will refer to pulmonology for further evaluation given chronic cough and exertional shortness of breath.

## 2016-09-07 NOTE — Assessment & Plan Note (Signed)
Has been fairly well controlled on CPAP. Patient stopped using CPAP given congestion. Encouraged her to use this and we'll get her in to see pulmonology.

## 2016-09-07 NOTE — Progress Notes (Signed)
[name], MD Phone: [phone]  Dana Obrien is a 71 y.o. female who presents today for follow-up.  Patient notes continued issues with cough. This has been a chronic issue over the last 10-12 months. She is currently on Flonase, Claritin, and Singulair. Also using albuterol. Does note some exertional shortness of breath that has been present over the last year. Has seen cardiology for that and had a reassuring echo and stress test. She does wake up at night with cough that is productive of clear/yellow/green mucus. Wears a CPAP nightly though has not worn it in the last week and notes that her congestion is improved with this to some degree. Does note she was sleeping quite well and waking up well rested with the CPAP. Does have reflux though rarely has symptoms given she takes Protonix daily. Does have exposure to black mold in her basement though has had this treated. She did take doxycycline for 10 days several weeks ago and also took a six-day prednisone taper with little benefit. She had these left over at home and they were not prescribed for that acute issue. She did see ear nose and throat though reports this was not very beneficial as it did not change anything. She has a history of recurrent chronic sinus infections as well. Has previously seen allergy and immunology at [facility]. Uses Flonase once daily as well as albuterol 1-2 times daily.  Hypertension: Notes this is good at home. Taking losartan HCTZ. No chest pain. No edema. See below for exertional shortness of breath.  PMH: nonsmoker.   ROS see history of present illness  Objective  Physical Exam Vitals:   09/07/16 1033  BP: 120/80  Pulse: 80  Temp: 98.1 F (36.7 C)    BP Readings from Last 3 Encounters:  09/07/16 120/80  07/08/16 110/70  01/15/16 116/74   Wt Readings from Last 3 Encounters:  09/07/16 203 lb (92.1 kg)  07/08/16 200 lb 3.2 oz (90.8 kg)  01/15/16 205 lb 9.6 oz (93.3 kg)    Physical Exam   Constitutional: No distress.  HENT:  Head: Normocephalic and atraumatic.  Mouth/Throat: Oropharynx is clear and moist. No oropharyngeal exudate.  Normal TMs  Eyes: Conjunctivae are normal. Pupils are equal, round, and reactive to light.  Cardiovascular: Normal rate, regular rhythm and normal heart sounds.   Pulmonary/Chest: Effort normal and breath sounds normal.  Musculoskeletal: She exhibits no edema.  Neurological: She is alert. Gait normal.  Skin: Skin is warm and dry. She is not diaphoretic.     Assessment/Plan: Please see individual problem list.  Hypertension At goal. Continue current medications recheck CMP.  OSA (obstructive sleep apnea) Has been fairly well controlled on CPAP. Patient stopped using CPAP given congestion. Encouraged her to use this and we'll get her in to see pulmonology.  Exertional shortness of breath Continues to have issues with this. Given her issues with persistent cough we will refer to pulmonology for further evaluation given negative cardiac workup. Given return precautions.  Cough Patient with persistent cough over the last 6 or so months. Benign lung exam. Chest x-ray reassuring. Does have chronic sinusitis and upper respiratory symptoms though this cough did not improve with prednisone and doxycycline that she treated herself with. No GERD symptoms. Has not responded overly well to addition of Singulair. PFTs have been ordered. Will refer to pulmonology for further evaluation given chronic cough and exertional shortness of breath.  GERD (gastroesophageal reflux disease) Well-controlled. Continue Protonix.   Orders Placed This  Encounter  Procedures  . Comp Met (CMET)  . Ambulatory referral to Pulmonology    Referral Priority:   Routine    Referral Type:   Consultation    Referral Reason:   Specialty Services Required    Requested Specialty:   Pulmonary Disease    Number of Visits Requested:   [place], MD [place]

## 2016-09-07 NOTE — Progress Notes (Signed)
Pre visit review using our clinic review tool, if applicable. No additional management support is needed unless otherwise documented below in the visit note. 

## 2016-09-07 NOTE — Assessment & Plan Note (Signed)
Continues to have issues with this. Given her issues with persistent cough we will refer to pulmonology for further evaluation given negative cardiac workup. Given return precautions.

## 2016-09-07 NOTE — Assessment & Plan Note (Signed)
Well-controlled.  Continue Protonix. 

## 2016-09-07 NOTE — Assessment & Plan Note (Signed)
At goal. Continue current medications recheck CMP.

## 2016-09-07 NOTE — Patient Instructions (Signed)
Nice to see you. We will get you to see Dr Magda Kiel for follow-up of allergies and for lung evaluation.  Please continue your current medications. If you develop shortness of breath, cough productive of blood, or any new or changing symptoms please seek medical attention immediately.

## 2016-09-17 NOTE — Addendum Note (Signed)
Addended by: Leone Haven on: 09/17/2016 03:33 PM   Modules accepted: Orders

## 2016-09-18 ENCOUNTER — Other Ambulatory Visit: Payer: Self-pay | Admitting: Family Medicine

## 2016-09-18 DIAGNOSIS — R053 Chronic cough: Secondary | ICD-10-CM

## 2016-09-18 DIAGNOSIS — R05 Cough: Secondary | ICD-10-CM

## 2016-09-24 DIAGNOSIS — M47816 Spondylosis without myelopathy or radiculopathy, lumbar region: Secondary | ICD-10-CM | POA: Diagnosis not present

## 2016-09-24 DIAGNOSIS — M419 Scoliosis, unspecified: Secondary | ICD-10-CM | POA: Diagnosis not present

## 2016-09-28 ENCOUNTER — Institutional Professional Consult (permissible substitution): Payer: PPO | Admitting: Internal Medicine

## 2016-09-28 DIAGNOSIS — G4733 Obstructive sleep apnea (adult) (pediatric): Secondary | ICD-10-CM | POA: Diagnosis not present

## 2016-10-02 ENCOUNTER — Ambulatory Visit (INDEPENDENT_AMBULATORY_CARE_PROVIDER_SITE_OTHER): Payer: PPO | Admitting: Internal Medicine

## 2016-10-02 ENCOUNTER — Encounter: Payer: Self-pay | Admitting: Internal Medicine

## 2016-10-02 VITALS — BP 144/90 | HR 91 | Resp 16 | Ht 64.0 in | Wt 205.0 lb

## 2016-10-02 DIAGNOSIS — R059 Cough, unspecified: Secondary | ICD-10-CM

## 2016-10-02 DIAGNOSIS — J4520 Mild intermittent asthma, uncomplicated: Secondary | ICD-10-CM | POA: Diagnosis not present

## 2016-10-02 DIAGNOSIS — R05 Cough: Secondary | ICD-10-CM | POA: Diagnosis not present

## 2016-10-02 MED ORDER — PREDNISONE 20 MG PO TABS
20.0000 mg | ORAL_TABLET | Freq: Every day | ORAL | 1 refills | Status: DC
Start: 2016-10-02 — End: 2016-10-30

## 2016-10-02 MED ORDER — AZELASTINE-FLUTICASONE 137-50 MCG/ACT NA SUSP: 1.0000 | Freq: Two times a day (BID) | NASAL | 4 refills | Status: DC

## 2016-10-02 MED ORDER — CETIRIZINE HCL 5 MG PO TABS: 10.0000 mg | ORAL_TABLET | Freq: Every day | ORAL | 3 refills | Status: DC

## 2016-10-02 NOTE — Patient Instructions (Signed)
Start Dymista Start Zyrtec 10 mg nightly ALbuterol as needed Prednisone 20 mg daily for 10 days

## 2016-10-02 NOTE — Progress Notes (Signed)
Name: [place] Obrien MRN: [id number] DOB: 1945/11/13    CONSULTATION DATE:  10/02/2016   REFERRING MD :  Caryl Bis  CHIEF COMPLAINT: cough   HISTORY OF PRESENT ILLNESS:  71 yo white female seen today for 6 month history of cough and chest congestion Has had 6 sinus infections since last 4-5 months She uses prednisone and abx every time and feels better with combination but symptoms return after completion She has been dx with OSA on CPAP and has NOT been using due to severe Sinus disease and issues and nasal congestion She wakes up every morning with coughing spells and mucus production She has intermittent wheezing and DOE and SOB  She is nonsmoker, has cats and dogs at home Has carpet in home, damp basement, mold recently removed  She has no signs of infection at this time No lower ext swelling She has been feeling miserable with her sinus issues She has tried flonase, claritian and has not really helped     PAST MEDICAL HISTORY :   has a past medical history of Cancer (Antimony) (2004); Depression; Hypertension; Migraine; Shingles; and Vitamin D deficiency.  has a past surgical history that includes Breast excisional biopsy (Right, 2004) and Breast biopsy (Right). Prior to Admission medications   Medication Sig Start Date End Date Taking? Authorizing Provider  albuterol (PROVENTIL HFA;VENTOLIN HFA) 108 (90 BASE) MCG/ACT inhaler Inhale 2 puffs into the lungs every 6 (six) hours as needed. 03/06/14  Yes Jackolyn Confer, MD  ALPRAZolam Duanne Moron) 0.5 MG tablet TAKE ONE TABLET BY MOUTH AT BEDTIME AS NEEDED FOR ANXIETY OR SLEEP 07/08/16  Yes Leone Haven, MD  celecoxib (CELEBREX) 100 MG capsule  10/10/15  Yes Historical Provider, MD  FLUoxetine (PROZAC) 20 MG capsule TAKE THREE CAPSULES BY MOUTH ONCE DAILY 07/08/16  Yes Leone Haven, MD  fluticasone [facility]) 50 MCG/ACT nasal spray USE TWO SPRAY(S) IN EACH NOSTRIL ONCE DAILY 10/07/15  Yes Jackolyn Confer, MD  Loratadine  (CLARITIN) 10 MG CAPS Take by mouth.   Yes Historical Provider, MD  losartan-hydrochlorothiazide (HYZAAR) 100-25 MG tablet Take 1 tablet by mouth daily. 02/07/16  Yes Leone Haven, MD  montelukast (SINGULAIR) 10 MG tablet Take 1 tablet (10 mg total) by mouth at bedtime. 07/08/16  Yes Leone Haven, MD  Multiple Vitamin (MULTIVITAMIN) capsule Take 1 capsule by mouth daily.   Yes Historical Provider, MD  pantoprazole (PROTONIX) 40 MG tablet TAKE ONE TABLET BY MOUTH ONCE DAILY 11/14/15  Yes Jackolyn Confer, MD  traMADol Veatrice Bourbon) 50 MG tablet  10/10/15  Yes Historical Provider, MD  tretinoin (RETIN-A) 0.05 % cream Apply 1 application topically at bedtime. 10/07/15  Yes Jackolyn Confer, MD   Allergies  Allergen Reactions  . Codeine Nausea Only and Nausea And Vomiting  . Morphine Nausea Only  . Niacin Rash  . Niacin And Related Rash    ANTIHYPERLIPEMICS    FAMILY HISTORY:  family history includes Cancer in her sister. SOCIAL HISTORY:  reports that she has never smoked. She has never used smokeless tobacco. She reports that she drinks alcohol. She reports that she does not use drugs.  REVIEW OF SYSTEMS:   Constitutional: Negative for fever, chills, weight loss, malaise/fatigue and diaphoresis.  HENT: Negative for hearing loss, -ear pain, -nosebleeds, +congestion, -sore throat, -neck pain, -tinnitus and -ear discharge.   Eyes: Negative for blurred vision, double vision, photophobia, pain, discharge and redness.  Respiratory: +cough, hemoptysis, sputum production, +shortness of breath, +wheezing and -  stridor.   Cardiovascular: Negative for chest pain, palpitations, orthopnea, claudication, leg swelling and PND.  Gastrointestinal: Negative for heartburn, nausea, vomiting, abdominal pain, diarrhea, constipation, blood in stool and melena.  Genitourinary: Negative for dysuria, urgency, frequency, hematuria and flank pain.  Musculoskeletal: Negative for myalgias, back pain, joint pain and  falls.  Skin: Negative for itching and rash.  Neurological: Negative for dizziness, tingling, tremors, sensory change, speech change, focal weakness, seizures, loss of consciousness, weakness and headaches.  Endo/Heme/Allergies: Negative for environmental allergies and polydipsia. Does not bruise/bleed easily.  BP (!) 144/90 (BP Location: Left Arm, Patient Position: Sitting, Cuff Size: Normal)   Pulse 91   Resp 16   Ht 5\' 4"  (1.626 m)   Wt 205 lb (93 kg)   SpO2 91%   BMI 35.19 kg/m    Physical Examination:   GENERAL:NAD, no fevers, chills,+fatigue HEAD: Normocephalic, atraumatic.  EYES: Pupils equal, round, reactive to light. Extraocular muscles intact. No scleral icterus.  MOUTH: Moist mucosal membrane. Dentition intact. No abscess noted.  EAR, NOSE, THROAT: Clear without exudates. No external lesions. +sinus pressure no pain NECK: Supple. No thyromegaly. No nodules. No JVD.  PULMONARY:NL exam no wheezes, no rhonchi CARDIOVASCULAR: S1 and S2. Regular rate and rhythm. No murmurs, rubs, or gallops. No edema. Pedal pulses 2+ bilaterally.  GASTROINTESTINAL: Soft, nontender, nondistended. No masses. Positive bowel sounds. No hepatosplenomegaly.  MUSCULOSKELETAL: No swelling, clubbing, or edema. Range of motion full in all extremities.  NEUROLOGIC: Cranial nerves II through XII are intact. No gross focal neurological deficits. Sensation intact. Reflexes intact.  SKIN: No ulceration, lesions, rashes, or cyanosis. Skin warm and dry. Turgor intact.  PSYCHIATRIC: Mood, affect within normal limits. The patient is awake, alert and oriented x 3. Insight, judgment intact.        I have Independently reviewed images of  CXR 07/08/16    on 10/02/2016 Interpretation:no acute process and no opacities noted Office Spiro 10/02/2016 WNL   ASSESSMENT / PLAN: 71 yo white female with chronic sinusitis with allergic rhinitis with intermittent reactive airways disease with underlying OSA   At this  time, her sinus issues are impeding her lifestyle but has normal spirometry.  She has an appointment to see ENT in [place] on May 15th.   Start Dymista Start Zyrtec 10 mg nightly ALbuterol as needed Prednisone 20 mg daily for 10 days CPAP therapy on hold for now due to severe sinus infection and sinus issues  Follow up in 4 weeks for re-assessment of symptoms   Patient  satisfied with Plan of action and management. All questions answered  Corrin Parker, M.D.  [facility]  Medical Director [place] Director [facility] Cardio-Pulmonary Department

## 2016-10-13 DIAGNOSIS — H6121 Impacted cerumen, right ear: Secondary | ICD-10-CM | POA: Diagnosis not present

## 2016-10-13 DIAGNOSIS — J329 Chronic sinusitis, unspecified: Secondary | ICD-10-CM | POA: Diagnosis not present

## 2016-10-13 DIAGNOSIS — H903 Sensorineural hearing loss, bilateral: Secondary | ICD-10-CM | POA: Insufficient documentation

## 2016-10-13 DIAGNOSIS — Z974 Presence of external hearing-aid: Secondary | ICD-10-CM | POA: Diagnosis not present

## 2016-10-13 DIAGNOSIS — Z9989 Dependence on other enabling machines and devices: Secondary | ICD-10-CM | POA: Diagnosis not present

## 2016-10-13 DIAGNOSIS — G4739 Other sleep apnea: Secondary | ICD-10-CM | POA: Diagnosis not present

## 2016-10-13 DIAGNOSIS — J343 Hypertrophy of nasal turbinates: Secondary | ICD-10-CM | POA: Diagnosis not present

## 2016-10-16 DIAGNOSIS — J329 Chronic sinusitis, unspecified: Secondary | ICD-10-CM | POA: Diagnosis not present

## 2016-10-16 DIAGNOSIS — J3489 Other specified disorders of nose and nasal sinuses: Secondary | ICD-10-CM | POA: Diagnosis not present

## 2016-10-19 ENCOUNTER — Telehealth: Payer: Self-pay | Admitting: Internal Medicine

## 2016-10-19 MED ORDER — AZELASTINE-FLUTICASONE 137-50 MCG/ACT NA SUSP: 1.0000 | Freq: Two times a day (BID) | NASAL | 4 refills | Status: DC

## 2016-10-19 NOTE — Telephone Encounter (Signed)
done

## 2016-10-19 NOTE — Telephone Encounter (Signed)
Pt calling asking if we can send in a prescription on nasal spray we gave her last time she was here If we can send it to [facility] on [address]  Please advise

## 2016-10-28 DIAGNOSIS — G4733 Obstructive sleep apnea (adult) (pediatric): Secondary | ICD-10-CM | POA: Diagnosis not present

## 2016-11-02 ENCOUNTER — Other Ambulatory Visit (HOSPITAL_COMMUNITY): Payer: PPO

## 2016-11-02 ENCOUNTER — Ambulatory Visit: Payer: PPO | Admitting: Internal Medicine

## 2016-11-03 ENCOUNTER — Encounter (HOSPITAL_COMMUNITY)
Admission: RE | Admit: 2016-11-03 | Discharge: 2016-11-03 | Disposition: A | Discharge status: Home / Self Care | Payer: PPO | Source: Ambulatory Visit | Attending: Otolaryngology | Admitting: Otolaryngology

## 2016-11-03 ENCOUNTER — Encounter (HOSPITAL_COMMUNITY): Payer: Self-pay

## 2016-11-03 DIAGNOSIS — Z9889 Other specified postprocedural states: Secondary | ICD-10-CM | POA: Diagnosis not present

## 2016-11-03 DIAGNOSIS — Z800 Family history of malignant neoplasm of digestive organs: Secondary | ICD-10-CM | POA: Diagnosis not present

## 2016-11-03 DIAGNOSIS — F329 Major depressive disorder, single episode, unspecified: Secondary | ICD-10-CM | POA: Diagnosis not present

## 2016-11-03 DIAGNOSIS — G4733 Obstructive sleep apnea (adult) (pediatric): Secondary | ICD-10-CM | POA: Diagnosis not present

## 2016-11-03 DIAGNOSIS — Z888 Allergy status to other drugs, medicaments and biological substances status: Secondary | ICD-10-CM | POA: Diagnosis not present

## 2016-11-03 DIAGNOSIS — Z7951 Long term (current) use of inhaled steroids: Secondary | ICD-10-CM | POA: Diagnosis not present

## 2016-11-03 DIAGNOSIS — G4730 Sleep apnea, unspecified: Secondary | ICD-10-CM | POA: Diagnosis not present

## 2016-11-03 DIAGNOSIS — Z885 Allergy status to narcotic agent status: Secondary | ICD-10-CM | POA: Diagnosis not present

## 2016-11-03 DIAGNOSIS — I10 Essential (primary) hypertension: Secondary | ICD-10-CM | POA: Diagnosis not present

## 2016-11-03 DIAGNOSIS — F419 Anxiety disorder, unspecified: Secondary | ICD-10-CM | POA: Diagnosis not present

## 2016-11-03 DIAGNOSIS — Z79899 Other long term (current) drug therapy: Secondary | ICD-10-CM | POA: Diagnosis not present

## 2016-11-03 DIAGNOSIS — J338 Other polyp of sinus: Secondary | ICD-10-CM | POA: Diagnosis not present

## 2016-11-03 DIAGNOSIS — J45909 Unspecified asthma, uncomplicated: Secondary | ICD-10-CM | POA: Diagnosis not present

## 2016-11-03 DIAGNOSIS — J324 Chronic pansinusitis: Secondary | ICD-10-CM | POA: Diagnosis not present

## 2016-11-03 DIAGNOSIS — K219 Gastro-esophageal reflux disease without esophagitis: Secondary | ICD-10-CM | POA: Diagnosis not present

## 2016-11-03 DIAGNOSIS — Z923 Personal history of irradiation: Secondary | ICD-10-CM | POA: Diagnosis not present

## 2016-11-03 DIAGNOSIS — Z90710 Acquired absence of both cervix and uterus: Secondary | ICD-10-CM | POA: Diagnosis not present

## 2016-11-03 DIAGNOSIS — Z9221 Personal history of antineoplastic chemotherapy: Secondary | ICD-10-CM | POA: Diagnosis not present

## 2016-11-03 HISTORY — DX: Unspecified asthma, uncomplicated: J45.909

## 2016-11-03 HISTORY — DX: Gastro-esophageal reflux disease without esophagitis: K21.9

## 2016-11-03 HISTORY — DX: Sleep apnea, unspecified: G47.30

## 2016-11-03 HISTORY — DX: Unspecified osteoarthritis, unspecified site: M19.90

## 2016-11-03 HISTORY — DX: Anxiety disorder, unspecified: F41.9

## 2016-11-03 LAB — BASIC METABOLIC PANEL
ANION GAP: 9 (ref 5–15)
BUN: 13 mg/dL (ref 6–20)
CO2: 28 mmol/L (ref 22–32)
Calcium: 9.2 mg/dL (ref 8.9–10.3)
Chloride: 101 mmol/L (ref 101–111)
Creatinine, Ser: 0.79 mg/dL (ref 0.44–1.00)
GFR calc Af Amer: >60 mL/min (ref >60)
GLUCOSE: 93 mg/dL (ref 65–99)
POTASSIUM: 4.4 mmol/L (ref 3.5–5.1)
Sodium: 138 mmol/L (ref 135–145)

## 2016-11-03 LAB — CBC
HCT: 37.3 % (ref 36.0–46.0)
Hemoglobin: 12.1 g/dL (ref 12.0–15.0)
MCH: 30.5 pg (ref 26.0–34.0)
MCHC: 32.4 g/dL (ref 30.0–36.0)
MCV: 94 fL (ref 78.0–100.0)
Platelets: 299 10*3/uL (ref 150–400)
RBC: 3.97 MIL/uL (ref 3.87–5.11)
RDW: 13.6 % (ref 11.5–15.5)
WBC: 8.4 10*3/uL (ref 4.0–10.5)

## 2016-11-03 NOTE — Pre-Procedure Instructions (Signed)
ALYSSAMAE KLINCK  11/03/2016      [id number] Lorina Rabon, [place] - [place] [address] Phone: [phone] Fax: [phone]  [facility] - [place], [place] - [address] [place] [address] Phone: [phone] Fax: [phone]  [facility] [address], [place] - [place] [address] Phone: [phone] Fax: [phone]    Your procedure is scheduled on Thursday June 7.  Report to [facility] Admitting at 9:30 A.M.  Call this number if you have problems the morning of surgery:  [phone]   Remember:  Do not eat food or drink liquids after midnight.  Take these medicines the morning of surgery with A SIP OF WATER: cetirizine (zyrtec), fluoxetine (prozac), pantoprazole (protonix), tramadol (ultram) if needed, albuterol if needed (please bring inhaler to hospital with you)  STOP taking any celebrex (celecoxib), Aspirin, Aleve, Naproxen, Ibuprofen, Motrin, Advil, Goody's, BC's, all herbal medications, fish oil, and all vitamins    Do not wear jewelry, make-up or nail polish.  Do not wear lotions, powders, or perfumes, or deoderant.  Do not shave 48 hours prior to surgery.  Men may shave face and neck.  Do not bring valuables to the hospital.  [facility] is not responsible for any belongings or valuables.  Contacts, dentures or bridgework may not be worn into surgery.  Leave your suitcase in the car.  After surgery it may be brought to your room.  For patients admitted to the hospital, discharge time will be determined by your treatment team.  Patients discharged the day of surgery will not be allowed to drive home.    Special instructions:    [place]- Preparing For Surgery  Before surgery, you can play an important role. Because skin is not sterile, your skin needs to be as free of germs as  possible. You can reduce the number of germs on your skin by washing with CHG (chlorahexidine gluconate) Soap before surgery.  CHG is an antiseptic cleaner which kills germs and bonds with the skin to continue killing germs even after washing.  Please do not use if you have an allergy to CHG or antibacterial soaps. If your skin becomes reddened/irritated stop using the CHG.  Do not shave (including legs and underarms) for at least 48 hours prior to first CHG shower. It is OK to shave your face.  Please follow these instructions carefully.   1. Shower the NIGHT BEFORE SURGERY and the MORNING OF SURGERY with CHG.   2. If you chose to wash your hair, wash your hair first as usual with your normal shampoo.  3. After you shampoo, rinse your hair and body thoroughly to remove the shampoo.  4. Use CHG as you would any other liquid soap. You can apply CHG directly to the skin and wash gently with a scrungie or a clean washcloth.   5. Apply the CHG Soap to your body ONLY FROM THE NECK DOWN.  Do not use on open wounds or open sores. Avoid contact with your eyes, ears, mouth and genitals (private parts). Wash genitals (private parts) with your normal soap.  6. Wash thoroughly, paying special attention to the area where your surgery will be performed.  7. Thoroughly rinse your body with warm water from the neck down.  8. DO NOT shower/wash with your normal soap  after using and rinsing off the CHG Soap.  9. Pat yourself dry with a CLEAN TOWEL.   10. Wear CLEAN PAJAMAS   11. Place CLEAN SHEETS on your bed the night of your first shower and DO NOT SLEEP WITH PETS.    Day of Surgery: Do not apply any deodorants/lotions. Please wear clean clothes to the hospital/surgery center.

## 2016-11-03 NOTE — Progress Notes (Signed)
PCP - Dr. Caryl Bis Cardiologist - pt saw Dr. Clayborn Bigness in august, note, ECHO and stress test in care everywhere  Chest x-ray - 07/08/16 EKG - 01/15/16 Stress Test - 01/30/16 ECHO - 01/30/16   Sleep Study - 11/21/15 CPAP - pt has been unable to use CPAP d/t severe sinus infection.   Patient denies shortness of breath, fever, cough and chest pain at PAT appointment  Patient verbalized understanding of instructions that were given to them at the PAT appointment. Patient was also instructed that they will need to review over the PAT instructions again at home before surgery.

## 2016-11-04 ENCOUNTER — Other Ambulatory Visit: Payer: Self-pay | Admitting: Otolaryngology

## 2016-11-04 NOTE — Progress Notes (Signed)
Anesthesia Chart Review:  Pt is a 71 year old female scheduled for bilateral endoscopic sinus surgery with navigation on 11/05/2016 with Melida Quitter, M.D.  - PCP is [name], M.D. - Pulmonologist is Flora Lipps, MD.  - Saw cardiologist Lujean Amel, M.D. 01/27/16 for symptoms of CP and SOB and pre-op evaluation for hysterectomy. Stress test and echo ordered, results below. Patient was cleared for surgery.  PMH includes: HTN, asthma, OSA (unable to use CPAP recently due to sinus infection), breast cancer, GERD. Never smoker. BMI 33.5. S/p hysterectomy 02/03/16  Medications include: Albuterol, losartan-HCTZ, Protonix  Preoperative labs reviewed.  CXR 07/08/16: No active cardiopulmonary disease.  EKG 01/15/16: Sinus rhythm. Consider old anterior infarct.  Nuclear stress test 01/30/16 (care everywhere):  - Normal myocardial perfusion scan no evidence of stress-induced myocardial ischemia ejection fraction of 64% conclusion negative scan  Echo 01/30/16 (care everywhere: 1. Normal LV systolic function. EF 55%. 2. Normal RV systolic function. 3. Trivial AR, TR, PR. Mild MR. 4. No valvular stenosis.  If no changes, I anticipate pt can proceed with surgery as scheduled.   Willeen Cass, FNP-BC [facility] Short Stay Surgical Center/Anesthesiology Phone: [phone] 11/04/2016 9:28 AM

## 2016-11-05 ENCOUNTER — Encounter (HOSPITAL_COMMUNITY): Payer: Self-pay

## 2016-11-05 ENCOUNTER — Ambulatory Visit (HOSPITAL_COMMUNITY): Payer: PPO | Admitting: Critical Care Medicine

## 2016-11-05 ENCOUNTER — Ambulatory Visit (HOSPITAL_COMMUNITY): Payer: PPO | Admitting: Vascular Surgery

## 2016-11-05 ENCOUNTER — Observation Stay (HOSPITAL_COMMUNITY)
Admission: RE | Admit: 2016-11-05 | Discharge: 2016-11-06 | Disposition: A | Discharge status: Home / Self Care | Payer: PPO | Source: Ambulatory Visit | Attending: Otolaryngology | Admitting: Otolaryngology

## 2016-11-05 ENCOUNTER — Encounter (HOSPITAL_COMMUNITY)
Admission: RE | Disposition: A | Discharge status: Home / Self Care | Payer: Self-pay | Source: Ambulatory Visit | Attending: Otolaryngology

## 2016-11-05 DIAGNOSIS — Z885 Allergy status to narcotic agent status: Secondary | ICD-10-CM | POA: Insufficient documentation

## 2016-11-05 DIAGNOSIS — Z90710 Acquired absence of both cervix and uterus: Secondary | ICD-10-CM | POA: Insufficient documentation

## 2016-11-05 DIAGNOSIS — F329 Major depressive disorder, single episode, unspecified: Secondary | ICD-10-CM | POA: Insufficient documentation

## 2016-11-05 DIAGNOSIS — G4730 Sleep apnea, unspecified: Secondary | ICD-10-CM | POA: Insufficient documentation

## 2016-11-05 DIAGNOSIS — F419 Anxiety disorder, unspecified: Secondary | ICD-10-CM | POA: Insufficient documentation

## 2016-11-05 DIAGNOSIS — I10 Essential (primary) hypertension: Secondary | ICD-10-CM | POA: Diagnosis not present

## 2016-11-05 DIAGNOSIS — J320 Chronic maxillary sinusitis: Secondary | ICD-10-CM | POA: Diagnosis not present

## 2016-11-05 DIAGNOSIS — Z79899 Other long term (current) drug therapy: Secondary | ICD-10-CM | POA: Insufficient documentation

## 2016-11-05 DIAGNOSIS — G4733 Obstructive sleep apnea (adult) (pediatric): Secondary | ICD-10-CM | POA: Insufficient documentation

## 2016-11-05 DIAGNOSIS — Z9221 Personal history of antineoplastic chemotherapy: Secondary | ICD-10-CM | POA: Insufficient documentation

## 2016-11-05 DIAGNOSIS — K219 Gastro-esophageal reflux disease without esophagitis: Secondary | ICD-10-CM | POA: Diagnosis not present

## 2016-11-05 DIAGNOSIS — J321 Chronic frontal sinusitis: Secondary | ICD-10-CM | POA: Diagnosis not present

## 2016-11-05 DIAGNOSIS — Z7951 Long term (current) use of inhaled steroids: Secondary | ICD-10-CM | POA: Insufficient documentation

## 2016-11-05 DIAGNOSIS — Z800 Family history of malignant neoplasm of digestive organs: Secondary | ICD-10-CM | POA: Insufficient documentation

## 2016-11-05 DIAGNOSIS — J323 Chronic sphenoidal sinusitis: Secondary | ICD-10-CM | POA: Diagnosis not present

## 2016-11-05 DIAGNOSIS — Z9889 Other specified postprocedural states: Secondary | ICD-10-CM | POA: Insufficient documentation

## 2016-11-05 DIAGNOSIS — J322 Chronic ethmoidal sinusitis: Secondary | ICD-10-CM | POA: Diagnosis not present

## 2016-11-05 DIAGNOSIS — Z888 Allergy status to other drugs, medicaments and biological substances status: Secondary | ICD-10-CM | POA: Insufficient documentation

## 2016-11-05 DIAGNOSIS — J324 Chronic pansinusitis: Secondary | ICD-10-CM | POA: Diagnosis not present

## 2016-11-05 DIAGNOSIS — Z923 Personal history of irradiation: Secondary | ICD-10-CM | POA: Insufficient documentation

## 2016-11-05 DIAGNOSIS — J329 Chronic sinusitis, unspecified: Secondary | ICD-10-CM | POA: Diagnosis not present

## 2016-11-05 DIAGNOSIS — F418 Other specified anxiety disorders: Secondary | ICD-10-CM | POA: Diagnosis not present

## 2016-11-05 DIAGNOSIS — J45909 Unspecified asthma, uncomplicated: Secondary | ICD-10-CM | POA: Diagnosis not present

## 2016-11-05 DIAGNOSIS — J338 Other polyp of sinus: Secondary | ICD-10-CM | POA: Diagnosis not present

## 2016-11-05 HISTORY — PX: SINUS ENDO W/FUSION: SHX777

## 2016-11-05 HISTORY — PX: ETHMOIDECTOMY: SHX5197

## 2016-11-05 HISTORY — PX: SPHENOIDECTOMY: SHX2421

## 2016-11-05 HISTORY — PX: SINUS EXPLORATION: SHX5214

## 2016-11-05 HISTORY — PX: MAXILLARY ANTROSTOMY: SHX2003

## 2016-11-05 SURGERY — SINUS SURGERY, ENDOSCOPIC, USING COMPUTER-ASSISTED NAVIGATION
Anesthesia: General | Site: Nose | Laterality: Bilateral

## 2016-11-05 MED ORDER — PROMETHAZINE HCL 25 MG/ML IJ SOLN: 6.2500 mg | INTRAMUSCULAR | Status: DC | PRN

## 2016-11-05 MED ORDER — CEFAZOLIN SODIUM 1 G IJ SOLR
INTRAMUSCULAR | Status: DC | PRN
  Administered 2016-11-05: 2 g via INTRAMUSCULAR

## 2016-11-05 MED ORDER — HYDROCHLOROTHIAZIDE 25 MG PO TABS
25.0000 mg | ORAL_TABLET | Freq: Every day | ORAL | Status: DC
  Administered 2016-11-05 – 2016-11-06 (×2): 25 mg via ORAL
  Filled 2016-11-05 (×2): qty 1

## 2016-11-05 MED ORDER — KETOROLAC TROMETHAMINE 30 MG/ML IJ SOLN
30.0000 mg | Freq: Once | INTRAMUSCULAR | Status: DC | PRN
  Administered 2016-11-05: 30 mg via INTRAVENOUS

## 2016-11-05 MED ORDER — CEPHALEXIN 500 MG PO CAPS
500.0000 mg | ORAL_CAPSULE | Freq: Three times a day (TID) | ORAL | Status: DC
  Administered 2016-11-05 – 2016-11-06 (×4): 500 mg via ORAL
  Filled 2016-11-05 (×4): qty 1

## 2016-11-05 MED ORDER — ALPRAZOLAM 0.5 MG PO TABS: 0.5000 mg | ORAL_TABLET | Freq: Every evening | ORAL | Status: DC | PRN

## 2016-11-05 MED ORDER — ALBUTEROL SULFATE HFA 108 (90 BASE) MCG/ACT IN AERS: 2.0000 | INHALATION_SPRAY | Freq: Four times a day (QID) | RESPIRATORY_TRACT | Status: DC | PRN

## 2016-11-05 MED ORDER — LIDOCAINE 2% (20 MG/ML) 5 ML SYRINGE
INTRAMUSCULAR | Status: AC
  Filled 2016-11-05: qty 10

## 2016-11-05 MED ORDER — 0.9 % SODIUM CHLORIDE (POUR BTL) OPTIME
TOPICAL | Status: DC | PRN
  Administered 2016-11-05: 1000 mL

## 2016-11-05 MED ORDER — MONTELUKAST SODIUM 10 MG PO TABS
10.0000 mg | ORAL_TABLET | Freq: Every day | ORAL | Status: DC
  Administered 2016-11-05: 10 mg via ORAL
  Filled 2016-11-05: qty 1

## 2016-11-05 MED ORDER — PROPOFOL 10 MG/ML IV BOLUS
INTRAVENOUS | Status: DC | PRN
Start: 2016-11-05 — End: 2016-11-05
  Administered 2016-11-05: 20 mg via INTRAVENOUS
  Administered 2016-11-05: 150 mg via INTRAVENOUS
  Administered 2016-11-05: 20 mg via INTRAVENOUS

## 2016-11-05 MED ORDER — ADULT MULTIVITAMIN W/MINERALS CH
1.0000 | ORAL_TABLET | Freq: Every day | ORAL | Status: DC
  Administered 2016-11-06: 1 via ORAL
  Filled 2016-11-05: qty 1

## 2016-11-05 MED ORDER — FLUOXETINE HCL 20 MG PO CAPS
60.0000 mg | ORAL_CAPSULE | Freq: Every day | ORAL | Status: DC
  Administered 2016-11-06: 60 mg via ORAL
  Filled 2016-11-05: qty 3

## 2016-11-05 MED ORDER — ALBUTEROL SULFATE (2.5 MG/3ML) 0.083% IN NEBU: 2.5000 mg | INHALATION_SOLUTION | Freq: Four times a day (QID) | RESPIRATORY_TRACT | Status: DC | PRN

## 2016-11-05 MED ORDER — PANTOPRAZOLE SODIUM 40 MG PO TBEC
40.0000 mg | DELAYED_RELEASE_TABLET | Freq: Every day | ORAL | Status: DC
  Administered 2016-11-06: 40 mg via ORAL
  Filled 2016-11-05: qty 1

## 2016-11-05 MED ORDER — TRAMADOL HCL 50 MG PO TABS
50.0000 mg | ORAL_TABLET | Freq: Four times a day (QID) | ORAL | Status: DC | PRN
  Administered 2016-11-05 (×2): 50 mg via ORAL
  Filled 2016-11-05: qty 1

## 2016-11-05 MED ORDER — OXYMETAZOLINE HCL 0.05 % NA SOLN
NASAL | Status: AC
  Filled 2016-11-05: qty 30

## 2016-11-05 MED ORDER — SUGAMMADEX SODIUM 200 MG/2ML IV SOLN
INTRAVENOUS | Status: DC | PRN
  Administered 2016-11-05: 200 mg via INTRAVENOUS

## 2016-11-05 MED ORDER — ONDANSETRON HCL 4 MG/2ML IJ SOLN
INTRAMUSCULAR | Status: DC | PRN
  Administered 2016-11-05: 4 mg via INTRAVENOUS

## 2016-11-05 MED ORDER — FENTANYL CITRATE (PF) 250 MCG/5ML IJ SOLN
INTRAMUSCULAR | Status: AC
  Filled 2016-11-05: qty 5

## 2016-11-05 MED ORDER — EPHEDRINE SULFATE-NACL 50-0.9 MG/10ML-% IV SOSY
PREFILLED_SYRINGE | INTRAVENOUS | Status: DC | PRN
  Administered 2016-11-05: 10 mg via INTRAVENOUS
  Administered 2016-11-05: 5 mg via INTRAVENOUS

## 2016-11-05 MED ORDER — PROMETHAZINE HCL 25 MG RE SUPP: 12.5000 mg | Freq: Four times a day (QID) | RECTAL | Status: DC | PRN

## 2016-11-05 MED ORDER — TRAMADOL HCL 50 MG PO TABS
ORAL_TABLET | ORAL | Status: AC
  Filled 2016-11-05: qty 1

## 2016-11-05 MED ORDER — HYDROCODONE-ACETAMINOPHEN 5-325 MG PO TABS
1.0000 | ORAL_TABLET | ORAL | Status: DC | PRN
  Administered 2016-11-05: 1 via ORAL
  Administered 2016-11-05 – 2016-11-06 (×2): 2 via ORAL
  Filled 2016-11-05: qty 2
  Filled 2016-11-05: qty 1
  Filled 2016-11-05 (×2): qty 2

## 2016-11-05 MED ORDER — SALINE SPRAY 0.65 % NA SOLN
2.0000 | NASAL | Status: DC
  Administered 2016-11-05 – 2016-11-06 (×7): 2 via NASAL
  Filled 2016-11-05: qty 44

## 2016-11-05 MED ORDER — PROPOFOL 10 MG/ML IV BOLUS
INTRAVENOUS | Status: AC
  Filled 2016-11-05: qty 20

## 2016-11-05 MED ORDER — ROCURONIUM BROMIDE 10 MG/ML (PF) SYRINGE
PREFILLED_SYRINGE | INTRAVENOUS | Status: DC | PRN
  Administered 2016-11-05: 40 mg via INTRAVENOUS

## 2016-11-05 MED ORDER — FENTANYL CITRATE (PF) 100 MCG/2ML IJ SOLN
INTRAMUSCULAR | Status: DC | PRN
Start: 2016-11-05 — End: 2016-11-05
  Administered 2016-11-05 (×2): 50 ug via INTRAVENOUS
  Administered 2016-11-05: 100 ug via INTRAVENOUS

## 2016-11-05 MED ORDER — DEXAMETHASONE SODIUM PHOSPHATE 10 MG/ML IJ SOLN
INTRAMUSCULAR | Status: AC
  Filled 2016-11-05: qty 1

## 2016-11-05 MED ORDER — HYDROMORPHONE HCL 1 MG/ML IJ SOLN: 0.2500 mg | INTRAMUSCULAR | Status: DC | PRN

## 2016-11-05 MED ORDER — LIDOCAINE 2% (20 MG/ML) 5 ML SYRINGE
INTRAMUSCULAR | Status: DC | PRN
  Administered 2016-11-05: 100 mg via INTRAVENOUS

## 2016-11-05 MED ORDER — MULTIVITAMINS PO CAPS: 1.0000 | ORAL_CAPSULE | Freq: Every day | ORAL | Status: DC

## 2016-11-05 MED ORDER — LORATADINE 10 MG PO TABS
10.0000 mg | ORAL_TABLET | Freq: Every day | ORAL | Status: DC
  Administered 2016-11-06: 10 mg via ORAL
  Filled 2016-11-05: qty 1

## 2016-11-05 MED ORDER — MIDAZOLAM HCL 2 MG/2ML IJ SOLN
INTRAMUSCULAR | Status: AC
  Filled 2016-11-05: qty 2

## 2016-11-05 MED ORDER — LOSARTAN POTASSIUM-HCTZ 100-25 MG PO TABS: 1.0000 | ORAL_TABLET | Freq: Every day | ORAL | Status: DC

## 2016-11-05 MED ORDER — LIDOCAINE-EPINEPHRINE 1 %-1:100000 IJ SOLN
INTRAMUSCULAR | Status: DC | PRN
  Administered 2016-11-05: 10 mL

## 2016-11-05 MED ORDER — PROMETHAZINE HCL 25 MG PO TABS
12.5000 mg | ORAL_TABLET | Freq: Four times a day (QID) | ORAL | Status: DC | PRN
  Administered 2016-11-05 – 2016-11-06 (×2): 12.5 mg via ORAL
  Filled 2016-11-05 (×3): qty 1

## 2016-11-05 MED ORDER — KETOROLAC TROMETHAMINE 30 MG/ML IJ SOLN
INTRAMUSCULAR | Status: AC
  Filled 2016-11-05: qty 1

## 2016-11-05 MED ORDER — MEPERIDINE HCL 25 MG/ML IJ SOLN: 6.2500 mg | INTRAMUSCULAR | Status: DC | PRN

## 2016-11-05 MED ORDER — LIDOCAINE-EPINEPHRINE 1 %-1:100000 IJ SOLN
INTRAMUSCULAR | Status: AC
  Filled 2016-11-05: qty 1

## 2016-11-05 MED ORDER — LOSARTAN POTASSIUM 50 MG PO TABS
100.0000 mg | ORAL_TABLET | Freq: Every day | ORAL | Status: DC
  Administered 2016-11-05 – 2016-11-06 (×2): 100 mg via ORAL
  Filled 2016-11-05 (×2): qty 2

## 2016-11-05 MED ORDER — TRETINOIN 0.05 % EX CREA: TOPICAL_CREAM | Freq: Every day | CUTANEOUS | Status: DC

## 2016-11-05 MED ORDER — TRETINOIN 0.05 % EX CREA: 1.0000 "application " | TOPICAL_CREAM | Freq: Every day | CUTANEOUS | Status: DC

## 2016-11-05 MED ORDER — ROCURONIUM BROMIDE 10 MG/ML (PF) SYRINGE
PREFILLED_SYRINGE | INTRAVENOUS | Status: AC
  Filled 2016-11-05: qty 5

## 2016-11-05 MED ORDER — PHENYLEPHRINE 40 MCG/ML (10ML) SYRINGE FOR IV PUSH (FOR BLOOD PRESSURE SUPPORT)
PREFILLED_SYRINGE | INTRAVENOUS | Status: DC | PRN
  Administered 2016-11-05: 40 ug via INTRAVENOUS
  Administered 2016-11-05: 80 ug via INTRAVENOUS
  Administered 2016-11-05: 40 ug via INTRAVENOUS

## 2016-11-05 MED ORDER — LACTATED RINGERS IV SOLN
INTRAVENOUS | Status: DC | PRN
  Administered 2016-11-05: 11:00:00 via INTRAVENOUS

## 2016-11-05 MED ORDER — OXYMETAZOLINE HCL 0.05 % NA SOLN
NASAL | Status: DC | PRN
  Administered 2016-11-05: 1 via TOPICAL

## 2016-11-05 MED ORDER — MIDAZOLAM HCL 5 MG/5ML IJ SOLN
INTRAMUSCULAR | Status: DC | PRN
  Administered 2016-11-05: 2 mg via INTRAVENOUS

## 2016-11-05 MED ORDER — KCL IN DEXTROSE-NACL 20-5-0.45 MEQ/L-%-% IV SOLN
INTRAVENOUS | Status: DC
  Administered 2016-11-05: 16:00:00 via INTRAVENOUS
  Filled 2016-11-05: qty 1000

## 2016-11-05 MED ORDER — SODIUM CHLORIDE 0.9 % IR SOLN
Status: DC | PRN
  Administered 2016-11-05 (×2): 1000 mL

## 2016-11-05 MED ORDER — MUPIROCIN 2 % EX OINT
TOPICAL_OINTMENT | CUTANEOUS | Status: DC | PRN
  Administered 2016-11-05: 1 via NASAL

## 2016-11-05 MED ORDER — DEXAMETHASONE SODIUM PHOSPHATE 10 MG/ML IJ SOLN
INTRAMUSCULAR | Status: DC | PRN
  Administered 2016-11-05: 10 mg via INTRAVENOUS

## 2016-11-05 MED ORDER — ARTIFICIAL TEARS OPHTHALMIC OINT
TOPICAL_OINTMENT | OPHTHALMIC | Status: DC | PRN
  Administered 2016-11-05: 1 via OPHTHALMIC

## 2016-11-05 MED ORDER — ONDANSETRON HCL 4 MG/2ML IJ SOLN
INTRAMUSCULAR | Status: AC
  Filled 2016-11-05: qty 2

## 2016-11-05 MED ORDER — PHENYLEPHRINE HCL 10 MG/ML IJ SOLN
INTRAVENOUS | Status: DC | PRN
  Administered 2016-11-05: 20 ug/min via INTRAVENOUS

## 2016-11-05 MED ORDER — MUPIROCIN 2 % EX OINT
TOPICAL_OINTMENT | CUTANEOUS | Status: AC
  Filled 2016-11-05: qty 22

## 2016-11-05 SURGICAL SUPPLY — 44 items
BLADE RAD 40 CVD SINUS 4MM (BLADE) IMPLANT
BLADE RAD40 ROTATE 4M 4 5PK (BLADE) IMPLANT
BLADE RAD60 ROTATE M4 4 5PK (BLADE) ×2 IMPLANT
BLADE ROTATE TRICUT 4X13 M4 (BLADE) ×2 IMPLANT
BLADE TRICUT ROTATE M4 4 5PK (BLADE) ×2 IMPLANT
CANISTER SUCT 3000ML PPV (MISCELLANEOUS) ×2 IMPLANT
CLSR STERI-STRIP ANTIMIC 1/2X4 (GAUZE/BANDAGES/DRESSINGS) ×4 IMPLANT
COAGULATOR SUCT SWTCH 10FR 6 (ELECTROSURGICAL) IMPLANT
CRADLE DONUT ADULT HEAD (MISCELLANEOUS) IMPLANT
DECANTER SPIKE VIAL GLASS SM (MISCELLANEOUS) ×2 IMPLANT
DRAPE HALF SHEET 40X57 (DRAPES) IMPLANT
DRESSING NASAL POPE 10X1.5X2.5 (GAUZE/BANDAGES/DRESSINGS) IMPLANT
DRESSING TELFA 8X10 (GAUZE/BANDAGES/DRESSINGS) IMPLANT
DRSG NASAL POPE 10X1.5X2.5 (GAUZE/BANDAGES/DRESSINGS)
DRSG NASOPORE 8CM (GAUZE/BANDAGES/DRESSINGS) ×2 IMPLANT
ELECT REM PT RETURN 9FT ADLT (ELECTROSURGICAL)
ELECTRODE REM PT RTRN 9FT ADLT (ELECTROSURGICAL) IMPLANT
GLOVE BIO SURGEON STRL SZ7.5 (GLOVE) ×2 IMPLANT
GLOVE ECLIPSE 6.5 STRL STRAW (GLOVE) ×4 IMPLANT
GOWN STRL REUS W/ TWL LRG LVL3 (GOWN DISPOSABLE) ×2 IMPLANT
GOWN STRL REUS W/TWL LRG LVL3 (GOWN DISPOSABLE) ×2
KIT BASIN OR (CUSTOM PROCEDURE TRAY) ×2 IMPLANT
KIT ROOM TURNOVER OR (KITS) ×2 IMPLANT
NEEDLE HYPO 25GX1X1/2 BEV (NEEDLE) IMPLANT
NEEDLE PRECISIONGLIDE 27X1.5 (NEEDLE) ×2 IMPLANT
NEEDLE SPNL 22GX3.5 QUINCKE BK (NEEDLE) ×2 IMPLANT
NS IRRIG 1000ML POUR BTL (IV SOLUTION) ×2 IMPLANT
PAD ARMBOARD 7.5X6 YLW CONV (MISCELLANEOUS) ×4 IMPLANT
PAD ENT ADHESIVE 25PK (MISCELLANEOUS) ×2 IMPLANT
PATTIES SURGICAL .5 X3 (DISPOSABLE) ×2 IMPLANT
SHEATH ENDOSCRUB 0 DEG (SHEATH) ×6 IMPLANT
SHEATH ENDOSCRUB 30 DEG (SHEATH) ×4 IMPLANT
SHEATH ENDOSCRUB 45 DEG (SHEATH) ×2 IMPLANT
SOL PREP POV-IOD 4OZ 10% (MISCELLANEOUS) ×2 IMPLANT
SOLUTION ANTI FOG 6CC (MISCELLANEOUS) ×2 IMPLANT
SUT ETHILON 3 0 PS 1 (SUTURE) IMPLANT
SWAB COLLECTION DEVICE MRSA (MISCELLANEOUS) IMPLANT
SYR 50ML SLIP (SYRINGE) IMPLANT
TOWEL OR 17X24 6PK STRL BLUE (TOWEL DISPOSABLE) ×2 IMPLANT
TRACKER ENT INSTRUMENT (MISCELLANEOUS) ×2 IMPLANT
TRACKER ENT PATIENT (MISCELLANEOUS) ×2 IMPLANT
TRAY ENT MC OR (CUSTOM PROCEDURE TRAY) ×2 IMPLANT
TUBE CONNECTING 12X1/4 (SUCTIONS) ×2 IMPLANT
TUBING STRAIGHTSHOT EPS 5PK (TUBING) IMPLANT

## 2016-11-05 NOTE — Brief Op Note (Signed)
11/05/2016  1:10 PM  PATIENT:  Dana Obrien  71 y.o. female  PRE-OPERATIVE DIAGNOSIS:  CHRONIC SINUSITIS  POST-OPERATIVE DIAGNOSIS:  CHRONIC SINUSITIS  PROCEDURE:  Procedure(s) with comments: FRONTAL RECESS EXPLORATION (Bilateral) - BILATERAL ENDOSCOPIC SINUS SURGERY WITH FUSION ETHMOIDECTOMY (Bilateral) MAXILLARY ANTROSTOMY (Bilateral) SPHENOIDECTOMY (Bilateral)  SURGEON:  Surgeon(s) and Role:    Melida Quitter, MD - Primary  PHYSICIAN ASSISTANT:   ASSISTANTS: none   ANESTHESIA:   general  EBL:  Total I/O In: 800 [I.V.:800] Out: 50 [Blood:50]  BLOOD ADMINISTERED:none  DRAINS: none   LOCAL MEDICATIONS USED:  LIDOCAINE   SPECIMEN:  Source of Specimen:  Sinus contents  DISPOSITION OF SPECIMEN:  PATHOLOGY  COUNTS:  YES  TOURNIQUET:  * No tourniquets in log *  DICTATION: .Other Dictation: Dictation Number [id number]  PLAN OF CARE: Admit for overnight observation  PATIENT DISPOSITION:  PACU - hemodynamically stable.   Delay start of Pharmacological VTE agent (>24hrs) due to surgical blood loss or risk of bleeding: yes

## 2016-11-05 NOTE — Progress Notes (Signed)
Pt rates pain 8/10, nasal burning when awakened, sleeps when left alone. O2 sat 87% on Room Air, put on humidified face shield --sat up to 97 %. Dr Jillyn Hidden fully updated.

## 2016-11-05 NOTE — H&P (Signed)
Dana Obrien is an 71 y.o. female.   Chief Complaint: Chronic sinusitis HPI: 71 year old female with chronic sinusitis with limited response to medical therapy presents for sinus surgery.   She has sleep apnea and wears CPAP typically.  Past Medical History:  Diagnosis Date  . Anxiety   . Arthritis   . Asthma   . Cancer [facility]) 2004   right lumpectomy, chemotherapy and radiation Dr. Jeb Levering  . Depression   . GERD (gastroesophageal reflux disease)   . Hypertension   . Migraine   . Shingles   . Sleep apnea    uses CPAP, severe OSA  . Vitamin D deficiency    IN THE PAST    Past Surgical History:  Procedure Laterality Date  . ABDOMINAL HYSTERECTOMY    . ANTERIOR AND POSTERIOR REPAIR    . BREAST BIOPSY Right    stereo prior to lumpectomy  . BREAST EXCISIONAL BIOPSY Right 2004   chemo and rad    Family History  Problem Relation Age of Onset  . Cancer Sister        COLON   Social History:  reports that she has never smoked. She has never used smokeless tobacco. She reports that she drinks alcohol. She reports that she does not use drugs.  Allergies:  Allergies  Allergen Reactions  . Codeine Nausea Only and Nausea And Vomiting  . Morphine Nausea Only  . Niacin Rash  . Niacin And Related Rash    ANTIHYPERLIPEMICS    Medications Prior to Admission  Medication Sig Dispense Refill  . albuterol (PROVENTIL HFA;VENTOLIN HFA) 108 (90 BASE) MCG/ACT inhaler Inhale 2 puffs into the lungs every 6 (six) hours as needed. 1 Inhaler 3  . ALPRAZolam (XANAX) 0.5 MG tablet TAKE ONE TABLET BY MOUTH AT BEDTIME AS NEEDED FOR ANXIETY OR SLEEP 90 tablet 1  . Azelastine-Fluticasone 137-50 MCG/ACT SUSP Place 1 spray into the nose 2 (two) times daily. 23 g 4  . celecoxib (CELEBREX) 100 MG capsule Take 100 mg by mouth 2 (two) times daily.     . cetirizine (ZYRTEC) 5 MG tablet Take 2 tablets (10 mg total) by mouth daily. 30 tablet 3  . FLUoxetine (PROZAC) 20 MG capsule TAKE THREE CAPSULES BY  MOUTH ONCE DAILY 90 capsule 1  . losartan-hydrochlorothiazide (HYZAAR) 100-25 MG tablet Take 1 tablet by mouth daily. 90 tablet 3  . montelukast (SINGULAIR) 10 MG tablet Take 1 tablet (10 mg total) by mouth at bedtime. 30 tablet 3  . Multiple Vitamin (MULTIVITAMIN) capsule Take 1 capsule by mouth daily.    . pantoprazole (PROTONIX) 40 MG tablet TAKE ONE TABLET BY MOUTH ONCE DAILY 90 tablet 3  . traMADol (ULTRAM) 50 MG tablet Take 50 mg by mouth every 6 (six) hours as needed for moderate pain.     Marland Kitchen tretinoin (RETIN-A) 0.05 % cream Apply 1 application topically at bedtime. 45 g 0    Results for orders placed or performed during the hospital encounter of 11/03/16 (from the past 48 hour(s))  Basic metabolic panel     Status: None   Collection Time: 11/03/16  3:08 PM  Result Value Ref Range   Sodium 138 135 - 145 mmol/L   Potassium 4.4 3.5 - 5.1 mmol/L   Chloride 101 101 - 111 mmol/L   CO2 28 22 - 32 mmol/L   Glucose, Bld 93 65 - 99 mg/dL   BUN 13 6 - 20 mg/dL   Creatinine, Ser 0.79 0.44 - 1.00  mg/dL   Calcium 9.2 8.9 - 10.3 mg/dL   GFR calc non Af Amer >60 >60 mL/min   GFR calc Af Amer >60 >60 mL/min    Comment: (NOTE) The eGFR has been calculated using the CKD EPI equation. This calculation has not been validated in all clinical situations. eGFR's persistently <60 mL/min signify possible Chronic Kidney Disease.    Anion gap 9 5 - 15  CBC     Status: None   Collection Time: 11/03/16  3:08 PM  Result Value Ref Range   WBC 8.4 4.0 - 10.5 K/uL   RBC 3.97 3.87 - 5.11 MIL/uL   Hemoglobin 12.1 12.0 - 15.0 g/dL   HCT 37.3 36.0 - 46.0 %   MCV 94.0 78.0 - 100.0 fL   MCH 30.5 26.0 - 34.0 pg   MCHC 32.4 30.0 - 36.0 g/dL   RDW 13.6 11.5 - 15.5 %   Platelets 299 150 - 400 K/uL   No results found.  Review of Systems  Musculoskeletal: Positive for back pain.  All other systems reviewed and are negative.   Blood pressure (!) 149/76, pulse 88, temperature 98 F (36.7 C), temperature  source Oral, resp. rate 18, height 5' 6"  (1.676 m), weight 207 lb 9.6 oz (94.2 kg), SpO2 96 %. Physical Exam  Constitutional: She is oriented to person, place, and time. She appears well-developed and well-nourished. No distress.  HENT:  Head: Normocephalic and atraumatic.  Right Ear: External ear normal.  Left Ear: External ear normal.  Nose: Nose normal.  Mouth/Throat: Oropharynx is clear and moist.  Eyes: Conjunctivae and EOM are normal. Pupils are equal, round, and reactive to light.  Neck: Normal range of motion. Neck supple.  Cardiovascular: Normal rate.   Respiratory: Effort normal.  Musculoskeletal: Normal range of motion.  Neurological: She is alert and oriented to person, place, and time. No cranial nerve deficit.  Skin: Skin is warm and dry.  Psychiatric: She has a normal mood and affect. Her behavior is normal. Judgment and thought content normal.     Assessment/Plan Chronic sinusitis with sleep apnea To OR for endoscopic sinus surgery.  Will observe overnight.  Melida Quitter, MD 11/05/2016, 11:32 AM

## 2016-11-05 NOTE — Op Note (Signed)
NAME:  ASIANA, BENNINGER                 ACCOUNT NO.:  MEDICAL RECORD NO.:  [id number]  LOCATION:                                 FACILITY:  PHYSICIAN:  Onnie Graham, MD          DATE OF BIRTH:  DATE OF PROCEDURE:  11/05/2016 DATE OF DISCHARGE:                              OPERATIVE REPORT   PREOPERATIVE DIAGNOSIS:  Chronic pansinusitis.  POSTOP DIAGNOSIS:  Chronic pansinusitis.  PROCEDURE: 1. Bilateral total ethmoidectomy. 2. Bilateral frontal recess exploration. 3. Bilateral maxillary antrostomy with tissue removal. 4. Bilateral sphenoidotomy. 5. Fusion image guidance.  SURGEON:  Onnie Graham, MD.  ANESTHESIA:  General endotracheal anesthesia.  COMPLICATIONS:  None.  INDICATION:  The patient is a 71 year old female with a long history of sinus problems including requiring treatment for 3-4 sinus infections per year in recent years.  Medical therapy has become less effective over time and she has been treated 6 times for sinusitis since August. She has daily periorbital headaches and coughs up thick, yellow-green mucus from the throat.  CT imaging of her sinuses demonstrated inflammatory involvement of all her sinuses and she presents to the operating room for surgical management.  FINDINGS:  The mucosa throughout both ethmoids was polypoid.  Maxillary openings were also obstructed by polypoid tissue and there was polypoid edema in the mucosa within the maxillary sinuses.  The frontal recesses and sphenoid openings were narrowed by polypoid tissue as well.  PROCEDURE DESCRIPTION:  The patient was identified in the holding room. Informed consent having been obtained including discussion of risks, benefits, alternatives, the patient was brought to the operative suite and put on the operating table in supine position.  Anesthesia was induced.  The patient was intubated by the anesthesia team without difficulty.  The patient was given intravenous antibiotics during  the case.  The eyes lubricated and the fusion antenna was placed on the side of the forehead.  The face was prepped and draped in sterile fashion. The patient was then registered to the fusion image guided system in a standard fashion.  The eyes were taped closed with Steri-Strips and Afrin pledgets were placed in both nasal passages.  After the instruments were fully registered, the right-sided pledgets were removed and the nasal passage examined with a 0-degree telescope.  The lateral nasal wall was injected with local anesthetic in standard fashion.  The middle turbinate was medialized and the uncinate process elevated off the lateral wall and divided and removed using the microdebrider.  The ethmoid bulla and anterior ethmoid air cells were also removed with the microdebrider.  The basal lamella was penetrated and posterior ethmoidal air cells were also removed with the microdebrider.  The image guidance was used to assist.  At this point, the lateral nasal walls was inspected with an angled telescope and the natural maxillary opening was identified and widened using suction and the microdebrider as well as up cutting through cut forceps.  Polypoid tissue was removed from the maxillary opening as well as from within the maxillary sinus using the microdebrider.  After this was completed, the straight telescope was again used and the middle turbinate was lateralized.  The posterior septum was injected with local anesthetic.  The inferior portion of the superior turbinate was removed using the microdebrider exposing the sphenoid rostrum.  The natural sphenoid opening was identified and widened laterally and superiorly using the microdebrider.  The middle turbinate was medialized once again and the anterior skull base was followed in retrograde fashion using an angled telescope and curved microdebrider to the frontal recess.  Frontal recess tissues were then removed using the  microdebrider, exposing the frontal recess fully and the natural frontal ostium was able to be identified and an obstructive tissue removed.  After this was completed, Afrin pledgets were placed in the ethmoid cavity.  The same procedure was then carried on the left side including local anesthetic of the lateral nasal wall, removal of the uncinate process, removal of the anterior and posterior ethmoid air cells, widening of the natural maxillary ostium, removal of polypoid tissue from within the maxillary sinus, partial removal of superior turbinate, widening of the natural sphenoid ostium superiorly and laterally, retrograde dissection of the anterior skull base to the frontal recess, and removal of tissues obstructing the frontal recess. Afrin pledgets were also placed in the left side.  Pledgets were then removed and the ethmoid cavity suctioned.  Half of the nasal pore pack was placed in each ethmoid cavity, coated with Bactroban ointment.  Each was then saturated with saline.  The nasal and oral cavities were then suctioned, and the patient was returned to Anesthesia for wake up, was extubated, and moved to recovery room in stable condition.     Onnie Graham, MD     DDB/MEDQ  D:  11/05/2016  T:  11/05/2016  Job:  [id number]

## 2016-11-05 NOTE — Anesthesia Postprocedure Evaluation (Signed)
Anesthesia Post Note  Patient: Dana Obrien  Procedure(s) Performed: Procedure(s) (LRB): FRONTAL RECESS EXPLORATION (Bilateral) ETHMOIDECTOMY (Bilateral) MAXILLARY ANTROSTOMY (Bilateral) SPHENOIDECTOMY (Bilateral)     Patient location during evaluation: PACU Anesthesia Type: General Level of consciousness: awake and sedated Pain management: pain level controlled Vital Signs Assessment: post-procedure vital signs reviewed and stable Respiratory status: spontaneous breathing Cardiovascular status: stable Postop Assessment: no signs of nausea or vomiting Anesthetic complications: no    Last Vitals:  Vitals:   11/05/16 1500 11/05/16 1502  BP:    Pulse: 79 78  Resp: 15 13  Temp:  36.3 C    Last Pain:  Vitals:   11/05/16 1500  TempSrc:   PainSc: 3    Pain Goal: Patients Stated Pain Goal: 4 (11/05/16 0929)               Tonilynn Bieker JR,JOHN Mateo Flow

## 2016-11-05 NOTE — Anesthesia Preprocedure Evaluation (Addendum)
Anesthesia Evaluation  Patient identified by MRN, date of birth, ID band Patient awake    Reviewed: Allergy & Precautions, NPO status , Patient's Chart, lab work & pertinent test results  Airway Mallampati: II       Dental no notable dental hx. (+) Dental Advisory Given   Pulmonary asthma , sleep apnea ,    Pulmonary exam normal breath sounds clear to auscultation       Cardiovascular hypertension, Pt. on medications Normal cardiovascular exam Rhythm:Regular Rate:Normal     Neuro/Psych  Headaches, PSYCHIATRIC DISORDERS Anxiety Depression    GI/Hepatic GERD  Medicated,  Endo/Other    Renal/GU      Musculoskeletal  (+) Arthritis ,   Abdominal (+) + obese,   Peds  Hematology   Anesthesia Other Findings   Reproductive/Obstetrics                            Anesthesia Physical Anesthesia Plan  ASA: III  Anesthesia Plan: General   Post-op Pain Management:    Induction: Intravenous  PONV Risk Score and Plan:   Airway Management Planned: Oral ETT  Additional Equipment:   Intra-op Plan:   Post-operative Plan: Extubation in OR  Informed Consent: I have reviewed the patients History and Physical, chart, labs and discussed the procedure including the risks, benefits and alternatives for the proposed anesthesia with the patient or authorized representative who has indicated his/her understanding and acceptance.   Dental advisory given  Plan Discussed with: Anesthesiologist and Surgeon  Anesthesia Plan Comments:         Anesthesia Quick Evaluation

## 2016-11-05 NOTE — Anesthesia Procedure Notes (Signed)
Procedure Name: Intubation Date/Time: 11/05/2016 11:42 AM Performed by: Merrilyn Puma B Pre-anesthesia Checklist: Patient identified, Emergency Drugs available, Suction available, Patient being monitored and Timeout performed Patient Re-evaluated:Patient Re-evaluated prior to inductionOxygen Delivery Method: Circle system utilized Preoxygenation: Pre-oxygenation with 100% oxygen Intubation Type: IV induction and Cricoid Pressure applied Ventilation: Mask ventilation without difficulty Laryngoscope Size: Mac and 3 Grade View: Grade III Tube type: Oral Tube size: 7.0 mm Number of attempts: 1 Airway Equipment and Method: Stylet Placement Confirmation: ETT inserted through vocal cords under direct vision,  positive ETCO2,  CO2 detector and breath sounds checked- equal and bilateral Secured at: 21 cm Tube secured with: Tape Dental Injury: Teeth and Oropharynx as per pre-operative assessment

## 2016-11-05 NOTE — Progress Notes (Signed)
Care of pt assumed by MA Karee Christopherson RN 

## 2016-11-05 NOTE — Transfer of Care (Signed)
Immediate Anesthesia Transfer of Care Note  Patient: Dana Obrien  Procedure(s) Performed: Procedure(s) with comments: FRONTAL RECESS EXPLORATION (Bilateral) - BILATERAL ENDOSCOPIC SINUS SURGERY WITH FUSION ETHMOIDECTOMY (Bilateral) MAXILLARY ANTROSTOMY (Bilateral) SPHENOIDECTOMY (Bilateral)  Patient Location: PACU  Anesthesia Type:General  Level of Consciousness: awake, alert  and oriented  Airway & Oxygen Therapy: Patient Spontanous Breathing and Patient connected to face mask oxygen  Post-op Assessment: Report given to RN, Post -op Vital signs reviewed and stable and Patient moving all extremities X 4  Post vital signs: Reviewed and stable  Last Vitals:  Vitals:   11/05/16 0924 11/05/16 1322  BP: (!) 149/76 (!) 163/79  Pulse: 88 98  Resp: 18 18  Temp: 36.7 C 36.4 C    Last Pain:  Vitals:   11/05/16 0929  TempSrc:   PainSc: 5       Patients Stated Pain Goal: 4 (04/16/51 0802)  Complications: No apparent anesthesia complications

## 2016-11-06 ENCOUNTER — Encounter (HOSPITAL_COMMUNITY): Payer: Self-pay | Admitting: Otolaryngology

## 2016-11-06 DIAGNOSIS — J324 Chronic pansinusitis: Secondary | ICD-10-CM | POA: Diagnosis not present

## 2016-11-06 MED ORDER — HYDROCODONE-ACETAMINOPHEN 5-325 MG PO TABS: 1.0000 | ORAL_TABLET | Freq: Four times a day (QID) | ORAL | 0 refills | Status: DC | PRN

## 2016-11-06 MED ORDER — PROMETHAZINE HCL 12.5 MG PO TABS: 12.5000 mg | ORAL_TABLET | Freq: Four times a day (QID) | ORAL | 0 refills | Status: DC | PRN

## 2016-11-06 MED ORDER — CEPHALEXIN 500 MG PO CAPS: 500.0000 mg | ORAL_CAPSULE | Freq: Three times a day (TID) | ORAL | 0 refills | Status: DC

## 2016-11-06 MED ORDER — PREDNISONE 10 MG PO TABS: ORAL_TABLET | ORAL | 0 refills | Status: DC

## 2016-11-06 NOTE — Progress Notes (Signed)
Patient discharged with instructions and prescriptions, IV removed and patient taken down to lobby for daughter to pick up.

## 2016-11-06 NOTE — Discharge Summary (Signed)
Physician Discharge Summary  Patient ID: Dana Obrien MRN: [id number] DOB/AGE: 1945-12-08 71 y.o.  Admit date: 11/05/2016 Discharge date: 11/06/2016  Admission Diagnoses: Chronic sinusitis, obstructive sleep apnea  Discharge Diagnoses:  Active Problems:   Chronic sinusitis Obstructive sleep apnea  Discharged Condition: good  Hospital Course: 71 year old female with chronic sinusitis with poor response to medical therapy presented for endoscopic sinus surgery.  See operative note.  She was observed overnight after surgery due to her history of sleep apnea.  She did well overnight with some headache and little bleeding.  On POD 1, she is felt stable for discharge.  Consults: None  Significant Diagnostic Studies: None  Treatments: surgery: Endoscopic sinus surgery  Discharge Exam: Blood pressure 118/63, pulse 69, temperature 97.5 F (36.4 C), temperature source Oral, resp. rate 18, height 5\' 6"  (1.676 m), weight 207 lb 9.6 oz (94.2 kg), SpO2 99 %. General appearance: alert, cooperative and no distress Nose: No bleeding  Disposition: Final discharge disposition not confirmed  Discharge Instructions    Diet - low sodium heart healthy    Complete by:  As directed    Discharge instructions    Complete by:  As directed    Avoid nose blowing.  Use saline spray in each nasal passage every couple of hours while awake.  Use Afrin-type spray if bleeding increases.  Change drip pad if bleeding.  Keep head elevated.   Increase activity slowly    Complete by:  As directed       Follow-up Information    Melida Quitter, MD. Schedule an appointment as soon as possible for a visit in 2 week(s).   Specialty:  Otolaryngology Contact information: [address] [place] [address] [phone]           Signed: Melida Quitter 11/06/2016, 11:07 AM

## 2016-11-19 DIAGNOSIS — Z9989 Dependence on other enabling machines and devices: Secondary | ICD-10-CM | POA: Diagnosis not present

## 2016-11-19 DIAGNOSIS — G4733 Obstructive sleep apnea (adult) (pediatric): Secondary | ICD-10-CM | POA: Diagnosis not present

## 2016-11-19 DIAGNOSIS — J343 Hypertrophy of nasal turbinates: Secondary | ICD-10-CM | POA: Diagnosis not present

## 2016-11-19 DIAGNOSIS — J329 Chronic sinusitis, unspecified: Secondary | ICD-10-CM | POA: Diagnosis not present

## 2016-11-28 DIAGNOSIS — G4733 Obstructive sleep apnea (adult) (pediatric): Secondary | ICD-10-CM | POA: Diagnosis not present

## 2016-12-03 DIAGNOSIS — J343 Hypertrophy of nasal turbinates: Secondary | ICD-10-CM | POA: Diagnosis not present

## 2016-12-03 DIAGNOSIS — J324 Chronic pansinusitis: Secondary | ICD-10-CM | POA: Diagnosis not present

## 2016-12-09 ENCOUNTER — Encounter: Payer: Self-pay | Admitting: Family Medicine

## 2016-12-09 ENCOUNTER — Other Ambulatory Visit: Payer: Self-pay | Admitting: *Deleted

## 2016-12-09 ENCOUNTER — Ambulatory Visit (INDEPENDENT_AMBULATORY_CARE_PROVIDER_SITE_OTHER): Payer: PPO | Admitting: Family Medicine

## 2016-12-09 VITALS — BP 110/74 | HR 78 | Temp 98.1°F | Wt 203.4 lb

## 2016-12-09 DIAGNOSIS — E785 Hyperlipidemia, unspecified: Secondary | ICD-10-CM | POA: Diagnosis not present

## 2016-12-09 DIAGNOSIS — F419 Anxiety disorder, unspecified: Secondary | ICD-10-CM

## 2016-12-09 DIAGNOSIS — G8929 Other chronic pain: Secondary | ICD-10-CM

## 2016-12-09 DIAGNOSIS — I10 Essential (primary) hypertension: Secondary | ICD-10-CM

## 2016-12-09 DIAGNOSIS — F32A Depression, unspecified: Secondary | ICD-10-CM

## 2016-12-09 DIAGNOSIS — F329 Major depressive disorder, single episode, unspecified: Secondary | ICD-10-CM

## 2016-12-09 DIAGNOSIS — J329 Chronic sinusitis, unspecified: Secondary | ICD-10-CM

## 2016-12-09 DIAGNOSIS — M545 Low back pain: Secondary | ICD-10-CM | POA: Diagnosis not present

## 2016-12-09 MED ORDER — BUPROPION HCL ER (XL) 150 MG PO TB24: 150.0000 mg | ORAL_TABLET | Freq: Every day | ORAL | 1 refills | Status: DC

## 2016-12-09 MED ORDER — CELECOXIB 100 MG PO CAPS: 100.0000 mg | ORAL_CAPSULE | Freq: Two times a day (BID) | ORAL | 1 refills | Status: DC

## 2016-12-09 MED ORDER — TRAMADOL HCL 50 MG PO TABS: 50.0000 mg | ORAL_TABLET | Freq: Four times a day (QID) | ORAL | 1 refills | Status: DC | PRN

## 2016-12-09 MED ORDER — ALPRAZOLAM 0.5 MG PO TABS: ORAL_TABLET | ORAL | 1 refills | Status: DC

## 2016-12-09 NOTE — Progress Notes (Signed)
[name], MD Phone: [phone]  Dana Obrien is a 71 y.o. female who presents today for follow-up.  HYPERTENSION  Disease Monitoring  120/78Home BP Monitoring 120/78 Chest pain- no    Dyspnea- no Medications  Compliance-  Taking losartan, HCTZ.  Edema- no  Depression/anxiety: Patient notes minimal depression. Quite a bit improved. She did restart herself back on Wellbutrin that went right to 300 mg. Notes some anxiety with this dose. Did help with her depression. Not using Xanax very often. No ascites or HI.  Low back pain: Was followed by orthopedics. They suggested she follow with [place] for her refills given that they were just refilling medication. She notes only her back hurts. No radiation down her legs. No numbness or weakness. No loss of bowel or bladder function. No saddle anesthesia. She did have an MRI that revealed degenerative disc disease and scoliosis. She takes tramadol and Celebrex with good benefit.  Hyperlipidemia: Previously on Lipitor though she was told by her sister who sees a hematologist that she should not take a statin due to some potential issue with low white blood cell count.   PMH: nonsmoker.   ROS see history of present illness  Objective  Physical Exam Vitals:   12/09/16 0929  BP: 110/74  Pulse: 78  Temp: 98.1 F (36.7 C)    BP Readings from Last 3 Encounters:  12/09/16 110/74  11/06/16 118/63  11/03/16 (!) 144/76   Wt Readings from Last 3 Encounters:  12/09/16 203 lb 6.4 oz (92.3 kg)  11/05/16 207 lb 9.6 oz (94.2 kg)  11/03/16 207 lb 9.6 oz (94.2 kg)    Physical Exam  Constitutional: No distress.  Cardiovascular: Normal rate, regular rhythm and normal heart sounds.   Pulmonary/Chest: Effort normal and breath sounds normal.  Musculoskeletal:  No midline spine tenderness, no midline spine step-off, no muscular back tenderness  Neurological: She is alert.  5/5 strength bilateral quads, hamstrings, plantar flexion, and  dorsiflexion, sensation light touch intact in bilateral lower extremities  Skin: She is not diaphoretic.     Assessment/Plan: Please see individual problem list.  Hypertension At goal. Continue current medications.  Chronic sinusitis Improved significantly following sinus surgery. She notes no issues breathing.  Anxiety and depression Improved. We will have her back on Wellbutrin 150 mg. I suspect the 300 mg dose may have contributed to some of the anxiety. She'll monitor with being on this dose. Continue other medications.  Hyperlipidemia Check fasting lipid panel. Determine need for statin afterwards.  Low back pain Chronic low back pain. Followed by orthopedics. She is going to follow with [place] now for this. Refill tramadol given. Refill of Celebrex given. Discussed potential for serotonin syndrome with tramadol and her antidepressants. Advised to return precautions.   Orders Placed This Encounter  Procedures  . Lipid panel    Standing Status:   Future    Standing Expiration Date:   12/09/2017    Meds ordered this encounter  Medications  . celecoxib (CELEBREX) 100 MG capsule    Sig: Take 1 capsule (100 mg total) by mouth 2 (two) times daily.    Dispense:  180 capsule    Refill:  1  . traMADol (ULTRAM) 50 MG tablet    Sig: Take 1 tablet (50 mg total) by mouth every 6 (six) hours as needed for moderate pain.    Dispense:  90 tablet    Refill:  1  . buPROPion (WELLBUTRIN XL) 150 MG 24 hr tablet  Sig: Take 1 tablet (150 mg total) by mouth daily.    Dispense:  90 tablet    Refill:  1   [name], MD [place]

## 2016-12-09 NOTE — Telephone Encounter (Signed)
faxed

## 2016-12-09 NOTE — Assessment & Plan Note (Signed)
Improved significantly following sinus surgery. She notes no issues breathing.

## 2016-12-09 NOTE — Telephone Encounter (Signed)
Patient had OV today.

## 2016-12-09 NOTE — Assessment & Plan Note (Signed)
Check fasting lipid panel. Determine need for statin afterwards.

## 2016-12-09 NOTE — Patient Instructions (Signed)
Nice to see you. I have refilled your tramadol and Celebrex. We will restart you on 150 mg of Wellbutrin. If your anxiety or depression gets worse or he develops numbness, weakness, loss of bowel or bladder function, or numbness between her legs didn't seek medical attention.

## 2016-12-09 NOTE — Assessment & Plan Note (Signed)
At goal. Continue current medications. 

## 2016-12-09 NOTE — Assessment & Plan Note (Signed)
Improved. We will have her back on Wellbutrin 150 mg. I suspect the 300 mg dose may have contributed to some of the anxiety. She'll monitor with being on this dose. Continue other medications.

## 2016-12-09 NOTE — Assessment & Plan Note (Signed)
Chronic low back pain. Followed by orthopedics. She is going to follow with [place] now for this. Refill tramadol given. Refill of Celebrex given. Discussed potential for serotonin syndrome with tramadol and her antidepressants. Advised to return precautions.

## 2016-12-09 NOTE — Telephone Encounter (Signed)
Medication Refill requested for : Xanax  Pharmacy: Return Contact :  [phone]

## 2016-12-10 ENCOUNTER — Other Ambulatory Visit: Payer: Self-pay

## 2016-12-10 MED ORDER — PANTOPRAZOLE SODIUM 40 MG PO TBEC: 40.0000 mg | DELAYED_RELEASE_TABLET | Freq: Every day | ORAL | 2 refills | Status: DC

## 2016-12-14 ENCOUNTER — Encounter: Payer: Self-pay | Admitting: Family Medicine

## 2016-12-14 ENCOUNTER — Other Ambulatory Visit: Payer: Self-pay | Admitting: Family Medicine

## 2016-12-14 NOTE — Telephone Encounter (Signed)
Last filled 10/03/16 Last office visit 12/09/16 Next office visit 01/01/17

## 2016-12-14 NOTE — Telephone Encounter (Signed)
Last OV 12/09/16 last filled 12/09/16

## 2016-12-15 NOTE — Telephone Encounter (Signed)
Chart says it was prescribed on 7/11.

## 2016-12-16 ENCOUNTER — Telehealth: Payer: Self-pay | Admitting: *Deleted

## 2016-12-16 NOTE — Telephone Encounter (Signed)
[facility] Dana Obrien has requested new orders for Cpap supplies  Contact [facility] : [phone] Fax [phone]

## 2016-12-16 NOTE — Telephone Encounter (Signed)
Please advise 

## 2016-12-16 NOTE — Telephone Encounter (Signed)
Pt called back returning your call. Please advise, thank you!  Call pt @ [phone]

## 2016-12-21 NOTE — Telephone Encounter (Signed)
Signed. Please fax.

## 2016-12-22 NOTE — Telephone Encounter (Signed)
faxed

## 2016-12-30 ENCOUNTER — Encounter: Payer: Self-pay | Admitting: Family Medicine

## 2016-12-30 MED ORDER — PANTOPRAZOLE SODIUM 40 MG PO TBEC: 40.0000 mg | DELAYED_RELEASE_TABLET | Freq: Every day | ORAL | 2 refills | Status: DC

## 2017-01-01 ENCOUNTER — Other Ambulatory Visit (INDEPENDENT_AMBULATORY_CARE_PROVIDER_SITE_OTHER): Payer: PPO

## 2017-01-01 ENCOUNTER — Other Ambulatory Visit: Payer: Self-pay | Admitting: Family Medicine

## 2017-01-01 DIAGNOSIS — E785 Hyperlipidemia, unspecified: Secondary | ICD-10-CM

## 2017-01-01 LAB — LIPID PANEL
CHOLESTEROL: 294 mg/dL — AB (ref 0–200)
HDL: 64.7 mg/dL (ref >39.00)
LDL CALC: 199 mg/dL — AB (ref 0–99)
NonHDL: 228.87
Total CHOL/HDL Ratio: 5
Triglycerides: 149 mg/dL (ref 0.0–149.0)
VLDL: 29.8 mg/dL (ref 0.0–40.0)

## 2017-01-01 MED ORDER — ROSUVASTATIN CALCIUM 20 MG PO TABS: 20.0000 mg | ORAL_TABLET | Freq: Every day | ORAL | 3 refills | Status: DC

## 2017-01-01 MED ORDER — CETIRIZINE HCL 10 MG PO TABS: 10.0000 mg | ORAL_TABLET | Freq: Every day | ORAL | 1 refills | Status: DC

## 2017-01-01 MED ORDER — PANTOPRAZOLE SODIUM 40 MG PO TBEC: 40.0000 mg | DELAYED_RELEASE_TABLET | Freq: Every day | ORAL | 2 refills | Status: DC

## 2017-01-01 MED ORDER — FLUOXETINE HCL 20 MG PO CAPS: ORAL_CAPSULE | ORAL | 1 refills | Status: DC

## 2017-01-01 NOTE — Telephone Encounter (Signed)
Pt needs refills on the following sent to [facility] [address] -FLUoxetine (PROZAC) 20 MG capsule -cetirizine (ZYRTEC) 5 MG tablet

## 2017-01-01 NOTE — Telephone Encounter (Signed)
Last OV 12/09/16 last filled Zyrtec by Dr.Kasa 10/02/16 30 3rf Prozac 07/08/16 90 1rf

## 2017-01-04 DIAGNOSIS — J343 Hypertrophy of nasal turbinates: Secondary | ICD-10-CM | POA: Diagnosis not present

## 2017-01-04 DIAGNOSIS — J324 Chronic pansinusitis: Secondary | ICD-10-CM | POA: Diagnosis not present

## 2017-01-29 ENCOUNTER — Other Ambulatory Visit: Payer: PPO

## 2017-03-11 ENCOUNTER — Ambulatory Visit: Payer: PPO | Admitting: Family Medicine

## 2017-03-16 ENCOUNTER — Other Ambulatory Visit (INDEPENDENT_AMBULATORY_CARE_PROVIDER_SITE_OTHER): Payer: PPO

## 2017-03-16 DIAGNOSIS — E785 Hyperlipidemia, unspecified: Secondary | ICD-10-CM

## 2017-03-16 LAB — HEPATIC FUNCTION PANEL
ALT: 13 U/L (ref 0–35)
AST: 13 U/L (ref 0–37)
Albumin: 4.1 g/dL (ref 3.5–5.2)
Alkaline Phosphatase: 65 U/L (ref 39–117)
BILIRUBIN TOTAL: 0.6 mg/dL (ref 0.2–1.2)
Bilirubin, Direct: 0.1 mg/dL (ref 0.0–0.3)
Total Protein: 6.8 g/dL (ref 6.0–8.3)

## 2017-03-16 LAB — CBC
HEMATOCRIT: 37.6 % (ref 36.0–46.0)
HEMOGLOBIN: 12.5 g/dL (ref 12.0–15.0)
MCHC: 33.3 g/dL (ref 30.0–36.0)
MCV: 93.5 fl (ref 78.0–100.0)
Platelets: 216 10*3/uL (ref 150.0–400.0)
RBC: 4.03 Mil/uL (ref 3.87–5.11)
RDW: 14.1 % (ref 11.5–15.5)
WBC: 6.9 10*3/uL (ref 4.0–10.5)

## 2017-03-16 LAB — LDL CHOLESTEROL, DIRECT: LDL DIRECT: 90 mg/dL

## 2017-04-15 DIAGNOSIS — J329 Chronic sinusitis, unspecified: Secondary | ICD-10-CM | POA: Diagnosis not present

## 2017-04-16 ENCOUNTER — Other Ambulatory Visit: Payer: Self-pay | Admitting: Family Medicine

## 2017-04-16 ENCOUNTER — Encounter: Payer: Self-pay | Admitting: Family Medicine

## 2017-04-16 MED ORDER — FLUOXETINE HCL 20 MG PO CAPS: ORAL_CAPSULE | ORAL | 1 refills | Status: DC

## 2017-04-30 ENCOUNTER — Encounter: Payer: Self-pay | Admitting: Family Medicine

## 2017-04-30 MED ORDER — TRAMADOL HCL 50 MG PO TABS: 50.0000 mg | ORAL_TABLET | Freq: Four times a day (QID) | ORAL | 1 refills | Status: DC | PRN

## 2017-04-30 NOTE — Telephone Encounter (Signed)
Last OV 12/09/16 last filled 12/09/16 90 1rf

## 2017-05-03 NOTE — Telephone Encounter (Signed)
faxed

## 2017-06-02 ENCOUNTER — Encounter: Payer: Self-pay | Admitting: Family Medicine

## 2017-06-17 ENCOUNTER — Other Ambulatory Visit: Payer: Self-pay | Admitting: Family Medicine

## 2017-06-25 ENCOUNTER — Encounter: Payer: Self-pay | Admitting: Family Medicine

## 2017-06-25 ENCOUNTER — Other Ambulatory Visit: Payer: Self-pay

## 2017-06-25 ENCOUNTER — Ambulatory Visit (INDEPENDENT_AMBULATORY_CARE_PROVIDER_SITE_OTHER): Payer: PPO | Admitting: Family Medicine

## 2017-06-25 VITALS — BP 124/76 | HR 82 | Temp 97.7°F | Wt 203.4 lb

## 2017-06-25 DIAGNOSIS — H9193 Unspecified hearing loss, bilateral: Secondary | ICD-10-CM | POA: Diagnosis not present

## 2017-06-25 DIAGNOSIS — F329 Major depressive disorder, single episode, unspecified: Secondary | ICD-10-CM

## 2017-06-25 DIAGNOSIS — Z1231 Encounter for screening mammogram for malignant neoplasm of breast: Secondary | ICD-10-CM | POA: Diagnosis not present

## 2017-06-25 DIAGNOSIS — Z853 Personal history of malignant neoplasm of breast: Secondary | ICD-10-CM

## 2017-06-25 DIAGNOSIS — F32A Depression, unspecified: Secondary | ICD-10-CM

## 2017-06-25 DIAGNOSIS — F419 Anxiety disorder, unspecified: Secondary | ICD-10-CM | POA: Diagnosis not present

## 2017-06-25 DIAGNOSIS — Z1239 Encounter for other screening for malignant neoplasm of breast: Secondary | ICD-10-CM

## 2017-06-25 DIAGNOSIS — I10 Essential (primary) hypertension: Secondary | ICD-10-CM | POA: Diagnosis not present

## 2017-06-25 LAB — BASIC METABOLIC PANEL
BUN: 20 mg/dL (ref 6–23)
CALCIUM: 9.2 mg/dL (ref 8.4–10.5)
CHLORIDE: 102 meq/L (ref 96–112)
CO2: 30 meq/L (ref 19–32)
CREATININE: 0.84 mg/dL (ref 0.40–1.20)
GFR: 71.27 mL/min (ref >60.00)
Glucose, Bld: 114 mg/dL — ABNORMAL HIGH (ref 70–99)
Potassium: 4.4 mEq/L (ref 3.5–5.1)
Sodium: 141 mEq/L (ref 135–145)

## 2017-06-25 MED ORDER — ALPRAZOLAM 0.5 MG PO TABS: 0.5000 mg | ORAL_TABLET | Freq: Two times a day (BID) | ORAL | 1 refills | Status: DC | PRN

## 2017-06-25 MED ORDER — FLUOXETINE HCL 20 MG PO CAPS: ORAL_CAPSULE | ORAL | 1 refills | Status: DC

## 2017-06-25 MED ORDER — ALBUTEROL SULFATE HFA 108 (90 BASE) MCG/ACT IN AERS: 2.0000 | INHALATION_SPRAY | Freq: Four times a day (QID) | RESPIRATORY_TRACT | 3 refills | Status: DC | PRN

## 2017-06-25 MED ORDER — BUPROPION HCL ER (XL) 150 MG PO TB24: 150.0000 mg | ORAL_TABLET | Freq: Every day | ORAL | 1 refills | Status: DC

## 2017-06-25 MED ORDER — HYDROCHLOROTHIAZIDE 12.5 MG PO TABS: 12.5000 mg | ORAL_TABLET | Freq: Every day | ORAL | 3 refills | Status: DC

## 2017-06-25 NOTE — Assessment & Plan Note (Signed)
History of breast cancer in the distant past.  Intermittent mammograms over the last decade reassuring.  Patient has held off on mammogram recently given multiple health concerns and surgeries and not wanting to do anything else during that time.  She states she is ready for the mammogram now.  I reinforced getting this done.

## 2017-06-25 NOTE — Assessment & Plan Note (Signed)
No depression currently.  All anxiety.  Has done okay with Wellbutrin and Prozac as well as Xanax.  Has been using it twice a day.  We will reflect this in her prescription.  She will monitor.  If not improving she will let us know.

## 2017-06-25 NOTE — Progress Notes (Signed)
[name], MD Phone: [phone]  Dana Obrien is a 72 y.o. female who presents today for follow-up.  Depression/anxiety: Notes no depression.  Mostly anxiety.  Her daughter is also living with her as well and this is put some stress on her.  She has been taking Wellbutrin and Prozac.  Xanax twice a day to help with this.  No drowsiness with this.  Hypertension: She came off medications about a week ago due to concern regarding losartan being recalled.  Blood pressure has been up and down.  Decently controlled today.  No chest pain, shortness of breath, or edema.  Patient reports over the last several months she has had issues with her hearing.  Just feels like she cannot hear quite as good.  Notes a crackling sensation.  No tinnitus.  Occasional discomfort when she does a sinus rinse.  She wants to see if I can look in her ears.  Does report a history of breast cancer in the past in 2004.  She has inconsistently gotten her mammograms done over the last 9-10 years.  She notes recently holding off on getting it done due to numerous health issues including bladder prolapse and hysterectomy and sinus surgery being completed in the last 2 years.  She is now ready to have her mammogram done.  Social History   Tobacco Use  Smoking Status Never Smoker  Smokeless Tobacco Never Used     ROS see history of present illness  Objective  Physical Exam Vitals:   06/25/17 0852  BP: 124/76  Pulse: 82  Temp: 97.7 F (36.5 C)  SpO2: 95%    BP Readings from Last 3 Encounters:  06/25/17 124/76  12/09/16 110/74  11/06/16 118/63   Wt Readings from Last 3 Encounters:  06/25/17 203 lb 6.4 oz (92.3 kg)  12/09/16 203 lb 6.4 oz (92.3 kg)  11/05/16 207 lb 9.6 oz (94.2 kg)    Physical Exam  Constitutional: No distress.  HENT:  Head: Normocephalic and atraumatic.  Normal left TM, right TM partially obscured by cerumen though does appear CMA attempted irrigation of the right ear  canal which caused some discomfort and thus it was stopped, more wax was obscuring the TM after attempted irrigation  Cardiovascular: Normal rate, regular rhythm and normal heart sounds.  Pulmonary/Chest: Effort normal and breath sounds normal.  Musculoskeletal: She exhibits no edema.  Neurological: She is alert. Gait normal.  Skin: Skin is warm and dry. She is not diaphoretic.     Assessment/Plan: Please see individual problem list.  Hypertension Seems to be well controlled.  We will place her back on hydrochlorothiazide at a low dose to start with.  Check BMP today.  She will follow-up in 1 month for BP check and labs.  Anxiety and depression No depression currently.  All anxiety.  Has done okay with Wellbutrin and Prozac as well as Xanax.  Has been using it twice a day.  We will reflect this in her prescription.  She will monitor.  If not improving she will let us know.  History of breast cancer History of breast cancer in the distant past.  Intermittent mammograms over the last decade reassuring.  Patient has held off on mammogram recently given multiple health concerns and surgeries and not wanting to do anything else during that time.  She states she is ready for the mammogram now.  I reinforced getting this done.  Hearing difficulty of both ears Unsure of cause.  We will refer her back  to ENT for evaluation.   Orders Placed This Encounter  Procedures  . MM Digital Screening    Standing Status:   Future    Standing Expiration Date:   08/24/2018    Order Specific Question:   Reason for Exam (SYMPTOM  OR DIAGNOSIS REQUIRED)    Answer:   screening    Order Specific Question:   Preferred imaging location?    Answer:   [facility]  . Basic Metabolic Panel (BMET)  . Ambulatory referral to ENT    Referral Priority:   Routine    Referral Type:   Consultation    Referral Reason:   Specialty Services Required    Requested Specialty:   Otolaryngology    Number of Visits  Requested:   1    Meds ordered this encounter  Medications  . albuterol (PROVENTIL HFA;VENTOLIN HFA) 108 (90 Base) MCG/ACT inhaler    Sig: Inhale 2 puffs into the lungs every 6 (six) hours as needed.    Dispense:  1 Inhaler    Refill:  3  . buPROPion (WELLBUTRIN XL) 150 MG 24 hr tablet    Sig: Take 1 tablet (150 mg total) by mouth daily.    Dispense:  90 tablet    Refill:  1  . FLUoxetine (PROZAC) 20 MG capsule    Sig: TAKE THREE CAPSULES BY MOUTH ONCE DAILY    Dispense:  90 capsule    Refill:  1    Please consider 90 day supplies to promote better adherence  . hydrochlorothiazide (HYDRODIURIL) 12.5 MG tablet    Sig: Take 1 tablet (12.5 mg total) by mouth daily.    Dispense:  90 tablet    Refill:  3  . ALPRAZolam (XANAX) 0.5 MG tablet    Sig: Take 1 tablet (0.5 mg total) by mouth 2 (two) times daily as needed for anxiety.    Dispense:  90 tablet    Refill:  1     [name], MD [place]

## 2017-06-25 NOTE — Assessment & Plan Note (Signed)
Seems to be well controlled.  We will place her back on hydrochlorothiazide at a low dose to start with.  Check BMP today.  She will follow-up in 1 month for BP check and labs.

## 2017-06-25 NOTE — Patient Instructions (Signed)
Nice to see you. Please make sure you get the mammogram done. Please start back on the hydrochlorothiazide.  We will check lab work today. We will get you in to see ear nose and throat for your ears.

## 2017-06-25 NOTE — Assessment & Plan Note (Signed)
Unsure of cause.  We will refer her back to ENT for evaluation.

## 2017-06-30 ENCOUNTER — Emergency Department: Payer: PPO

## 2017-06-30 ENCOUNTER — Other Ambulatory Visit: Payer: Self-pay

## 2017-06-30 ENCOUNTER — Encounter: Payer: Self-pay | Admitting: Emergency Medicine

## 2017-06-30 ENCOUNTER — Ambulatory Visit: Payer: Self-pay | Admitting: *Deleted

## 2017-06-30 ENCOUNTER — Emergency Department
Admission: EM | Admit: 2017-06-30 | Discharge: 2017-06-30 | Disposition: A | Discharge status: Home / Self Care | Payer: PPO | Attending: Emergency Medicine | Admitting: Emergency Medicine

## 2017-06-30 DIAGNOSIS — J45909 Unspecified asthma, uncomplicated: Secondary | ICD-10-CM | POA: Diagnosis not present

## 2017-06-30 DIAGNOSIS — Z79899 Other long term (current) drug therapy: Secondary | ICD-10-CM | POA: Insufficient documentation

## 2017-06-30 DIAGNOSIS — K760 Fatty (change of) liver, not elsewhere classified: Secondary | ICD-10-CM | POA: Diagnosis not present

## 2017-06-30 DIAGNOSIS — R110 Nausea: Secondary | ICD-10-CM | POA: Diagnosis not present

## 2017-06-30 DIAGNOSIS — R1011 Right upper quadrant pain: Secondary | ICD-10-CM | POA: Diagnosis not present

## 2017-06-30 DIAGNOSIS — I10 Essential (primary) hypertension: Secondary | ICD-10-CM | POA: Insufficient documentation

## 2017-06-30 DIAGNOSIS — R109 Unspecified abdominal pain: Secondary | ICD-10-CM | POA: Diagnosis not present

## 2017-06-30 DIAGNOSIS — R1031 Right lower quadrant pain: Secondary | ICD-10-CM | POA: Diagnosis not present

## 2017-06-30 DIAGNOSIS — K5900 Constipation, unspecified: Secondary | ICD-10-CM | POA: Diagnosis not present

## 2017-06-30 LAB — URINALYSIS, COMPLETE (UACMP) WITH MICROSCOPIC
BILIRUBIN URINE: NEGATIVE
Glucose, UA: NEGATIVE mg/dL
HGB URINE DIPSTICK: NEGATIVE
KETONES UR: NEGATIVE mg/dL
NITRITE: NEGATIVE
Protein, ur: NEGATIVE mg/dL
SPECIFIC GRAVITY, URINE: 1.013 (ref 1.005–1.030)
pH: 7 (ref 5.0–8.0)

## 2017-06-30 LAB — COMPREHENSIVE METABOLIC PANEL
ALK PHOS: 51 U/L (ref 38–126)
ALT: 19 U/L (ref 14–54)
AST: 24 U/L (ref 15–41)
Albumin: 4.1 g/dL (ref 3.5–5.0)
Anion gap: 9 (ref 5–15)
BILIRUBIN TOTAL: 0.6 mg/dL (ref 0.3–1.2)
BUN: 13 mg/dL (ref 6–20)
CALCIUM: 9.2 mg/dL (ref 8.9–10.3)
CO2: 26 mmol/L (ref 22–32)
CREATININE: 0.86 mg/dL (ref 0.44–1.00)
Chloride: 106 mmol/L (ref 101–111)
Glucose, Bld: 131 mg/dL — ABNORMAL HIGH (ref 65–99)
Potassium: 3.8 mmol/L (ref 3.5–5.1)
Sodium: 141 mmol/L (ref 135–145)
Total Protein: 7.2 g/dL (ref 6.5–8.1)

## 2017-06-30 LAB — CBC
HEMATOCRIT: 38.1 % (ref 35.0–47.0)
HEMOGLOBIN: 12.6 g/dL (ref 12.0–16.0)
MCH: 30.6 pg (ref 26.0–34.0)
MCHC: 33.1 g/dL (ref 32.0–36.0)
MCV: 92.4 fL (ref 80.0–100.0)
Platelets: 249 10*3/uL (ref 150–440)
RBC: 4.13 MIL/uL (ref 3.80–5.20)
RDW: 13.9 % (ref 11.5–14.5)
WBC: 6.9 10*3/uL (ref 3.6–11.0)

## 2017-06-30 LAB — TROPONIN I

## 2017-06-30 LAB — LIPASE, BLOOD: Lipase: 34 U/L (ref 11–51)

## 2017-06-30 MED ORDER — DOCUSATE SODIUM 100 MG PO CAPS: ORAL_CAPSULE | ORAL | 0 refills | Status: DC

## 2017-06-30 MED ORDER — ONDANSETRON HCL 4 MG/2ML IJ SOLN
4.0000 mg | INTRAMUSCULAR | Status: AC
Start: 2017-06-30 — End: 2017-06-30
  Administered 2017-06-30: 4 mg via INTRAVENOUS

## 2017-06-30 MED ORDER — OXYCODONE-ACETAMINOPHEN 5-325 MG PO TABS
2.0000 | ORAL_TABLET | Freq: Once | ORAL | Status: AC
  Administered 2017-06-30: 2 via ORAL
  Filled 2017-06-30: qty 2

## 2017-06-30 MED ORDER — MORPHINE SULFATE (PF) 4 MG/ML IV SOLN
4.0000 mg | Freq: Once | INTRAVENOUS | Status: AC
  Administered 2017-06-30: 4 mg via INTRAVENOUS

## 2017-06-30 MED ORDER — OXYCODONE-ACETAMINOPHEN 5-325 MG PO TABS: 1.0000 | ORAL_TABLET | Freq: Four times a day (QID) | ORAL | 0 refills | Status: DC | PRN

## 2017-06-30 MED ORDER — MORPHINE SULFATE (PF) 4 MG/ML IV SOLN
INTRAVENOUS | Status: AC
  Administered 2017-06-30: 4 mg via INTRAVENOUS
  Filled 2017-06-30: qty 1

## 2017-06-30 MED ORDER — ONDANSETRON HCL 4 MG/2ML IJ SOLN
INTRAMUSCULAR | Status: AC
  Administered 2017-06-30: 4 mg via INTRAVENOUS
  Filled 2017-06-30: qty 2

## 2017-06-30 MED ORDER — IOPAMIDOL (ISOVUE-370) INJECTION 76%
75.0000 mL | Freq: Once | INTRAVENOUS | Status: AC | PRN
  Administered 2017-06-30: 75 mL via INTRAVENOUS
  Filled 2017-06-30: qty 75

## 2017-06-30 NOTE — ED Provider Notes (Signed)
[facility] Emergency Department Provider Note  ____________________________________________   First MD Initiated Contact with Patient 06/30/17 1641     (approximate)  I have reviewed the triage vital signs and the nursing notes.   HISTORY  Chief Complaint Abdominal Pain    HPI Dana Obrien is a 72 y.o. female who presents for evaluation of several days of abdominal pain that has been more or less constant and gradually getting worse.  She has had some nausea but no vomiting.  She is also felt constipated.  She went to [place] [facility] today and was started on Cipro and Flagyl for possible diverticulitis but her pain was much worse after a short nap this afternoon so she was advised to come into the emergency department.  She looks quite uncomfortable and reports that her pain is moderate to severe.  She has had an appendectomy in the past but has not had a cholecystectomy.  Her pain is primarily in right lower quadrant but it also radiates to or also originates from the middle of her right side.  She denies fever/chills, chest pain, shortness of breath, diarrhea.  Abdominal pain and constipation as described above.  She denies dysuria.  She has not had similar past.  Past Medical History:  Diagnosis Date  . Anxiety   . Arthritis   . Asthma   . Cancer [facility]) 2004   right lumpectomy, chemotherapy and radiation Dr. Jeb Levering  . Depression   . GERD (gastroesophageal reflux disease)   . Hypertension   . Migraine   . Shingles   . Sleep apnea    uses CPAP, severe OSA  . Vitamin D deficiency    IN THE PAST    Patient Active Problem List   Diagnosis Date Noted  . Hearing difficulty of both ears 06/25/2017  . Chronic sinusitis 11/05/2016  . GERD (gastroesophageal reflux disease) 09/07/2016  . Cough 07/08/2016  . Exertional shortness of breath 01/15/2016  . OSA (obstructive sleep apnea) 11/01/2015  . Low back pain 01/01/2015  . Varicose vein of leg  11/22/2014  . Post herpetic neuralgia 03/06/2014  . Cystocele 10/25/2013  . Skin lesion 10/25/2013  . Obesity, unspecified 10/19/2012  . History of breast cancer 02/11/2012  . Hyperlipidemia 05/11/2011  . Osteopenia 05/11/2011  . Anxiety and depression 03/04/2011  . Hypertension 03/04/2011    Past Surgical History:  Procedure Laterality Date  . ABDOMINAL HYSTERECTOMY    . ANTERIOR AND POSTERIOR REPAIR    . BREAST BIOPSY Right    stereo prior to lumpectomy  . BREAST EXCISIONAL BIOPSY Right 2004   chemo and rad  . ETHMOIDECTOMY Bilateral 11/05/2016   Procedure: ETHMOIDECTOMY;  Surgeon: Melida Quitter, MD;  Location: [place];  Service: ENT;  Laterality: Bilateral;  . MAXILLARY ANTROSTOMY Bilateral 11/05/2016   Procedure: MAXILLARY ANTROSTOMY;  Surgeon: Melida Quitter, MD;  Location: [place];  Service: ENT;  Laterality: Bilateral;  . SINUS ENDO W/FUSION Bilateral 11/05/2016   Procedure: FRONTAL RECESS EXPLORATION;  Surgeon: Melida Quitter, MD;  Location: [place];  Service: ENT;  Laterality: Bilateral;  BILATERAL ENDOSCOPIC SINUS SURGERY WITH FUSION  . SINUS EXPLORATION  11/05/2016  . SPHENOIDECTOMY Bilateral 11/05/2016   Procedure: SPHENOIDECTOMY;  Surgeon: Melida Quitter, MD;  Location: [place];  Service: ENT;  Laterality: Bilateral;    Prior to Admission medications   Medication Sig Start Date End Date Taking? Authorizing Provider  albuterol (PROVENTIL HFA;VENTOLIN HFA) 108 (90 Base) MCG/ACT inhaler Inhale 2 puffs into the lungs every  6 (six) hours as needed. 06/25/17   Leone Haven, MD  ALPRAZolam Duanne Moron) 0.5 MG tablet Take 1 tablet (0.5 mg total) by mouth 2 (two) times daily as needed for anxiety. 06/25/17   Leone Haven, MD  Azelastine-Fluticasone [phone] MCG/ACT SUSP Place 1 spray into the nose 2 (two) times daily. 10/19/16   Flora Lipps, MD  buPROPion (WELLBUTRIN XL) 150 MG 24 hr tablet Take 1 tablet (150 mg total) by mouth daily. 06/25/17   Leone Haven, MD  celecoxib (CELEBREX)  100 MG capsule Take 1 capsule (100 mg total) by mouth 2 (two) times daily. 12/09/16   Leone Haven, MD  cetirizine (ZYRTEC) 10 MG tablet Take 1 tablet (10 mg total) by mouth daily. 01/01/17   Leone Haven, MD  docusate sodium (COLACE) 100 MG capsule Take 1 tablet once or twice daily as needed for constipation while taking narcotic pain medicine 06/30/17   Hinda Kehr, MD  FLUoxetine (PROZAC) 20 MG capsule TAKE THREE CAPSULES BY MOUTH ONCE DAILY 06/25/17   Leone Haven, MD  hydrochlorothiazide (HYDRODIURIL) 12.5 MG tablet Take 1 tablet (12.5 mg total) by mouth daily. 06/25/17   Leone Haven, MD  montelukast (SINGULAIR) 10 MG tablet Take 1 tablet (10 mg total) by mouth at bedtime. 07/08/16   Leone Haven, MD  Multiple Vitamin (MULTIVITAMIN) capsule Take 1 capsule by mouth daily.    [provider]  oxyCODONE-acetaminophen (PERCOCET) 5-325 MG tablet Take 1-2 tablets by mouth every 6 (six) hours as needed for severe pain. 06/30/17   Hinda Kehr, MD  pantoprazole (PROTONIX) 40 MG tablet Take 1 tablet (40 mg total) by mouth daily. 01/01/17   Leone Haven, MD  rosuvastatin (CRESTOR) 20 MG tablet Take 1 tablet (20 mg total) by mouth daily. 01/01/17   Leone Haven, MD  traMADol (ULTRAM) 50 MG tablet Take 1 tablet (50 mg total) by mouth every 6 (six) hours as needed for moderate pain. 04/30/17   Leone Haven, MD  tretinoin (RETIN-A) 0.05 % cream Apply 1 application topically at bedtime. 10/07/15   Jackolyn Confer, MD    Allergies Codeine; Morphine; Niacin; and Niacin and related  Family History  Problem Relation Age of Onset  . Cancer Sister        COLON    Social History Social History   Tobacco Use  . Smoking status: Never Smoker  . Smokeless tobacco: Never Used  Substance Use Topics  . Alcohol use: Yes    Comment: rarely  . Drug use: No    Review of Systems Constitutional: No fever/chills Eyes: No visual changes. ENT: No sore  throat. Cardiovascular: Denies chest pain. Respiratory: Denies shortness of breath. Gastrointestinal: Right-sided abdominal pain with nausea and constipation as described above. Genitourinary: Negative for dysuria. Musculoskeletal: Negative for neck pain.  Negative for back pain. Integumentary: Negative for rash. Neurological: Negative for headaches, focal weakness or numbness.   ____________________________________________   PHYSICAL EXAM:  VITAL SIGNS: ED Triage Vitals  Enc Vitals Group     BP 06/30/17 1447 135/71     Pulse Rate 06/30/17 1447 80     Resp 06/30/17 1447 16     Temp 06/30/17 1447 98.6 F (37 C)     Temp Source 06/30/17 1447 Oral     SpO2 06/30/17 1447 97 %     Weight 06/30/17 1448 90.7 kg (200 lb)     Height --      Head Circumference --  Peak Flow --      Pain Score 06/30/17 1447 10     Pain Loc --      Pain Edu? --      Excl. in [place]? --     Constitutional: Alert and oriented.  Appears to be in pain Eyes: Conjunctivae are normal.  Head: Atraumatic. Nose: No congestion/rhinnorhea. Mouth/Throat: Mucous membranes are moist. Neck: No stridor.  No meningeal signs.   Cardiovascular: Normal rate, regular rhythm. Good peripheral circulation. Grossly normal heart sounds. Respiratory: Normal respiratory effort.  No retractions. Lungs CTAB. Gastrointestinal: Soft with tenderness in both the right lower and right upper quadrants, no rebound or guarding with no localized peritonitis.  No distention appreciated. Musculoskeletal: No lower extremity tenderness nor edema. No gross deformities of extremities. Neurologic:  Normal speech and language. No gross focal neurologic deficits are appreciated.  Skin:  Skin is warm, dry and intact. No rash noted. Psychiatric: Mood and affect are normal. Speech and behavior are normal.  ____________________________________________   LABS (all labs ordered are listed, but only abnormal results are displayed)  Labs Reviewed   COMPREHENSIVE METABOLIC PANEL - Abnormal; Notable for the following components:      Result Value   Glucose, Bld 131 (*)    All other components within normal limits  URINALYSIS, COMPLETE (UACMP) WITH MICROSCOPIC - Abnormal; Notable for the following components:   Color, Urine YELLOW (*)    APPearance CLOUDY (*)    Leukocytes, UA MODERATE (*)    Bacteria, UA RARE (*)    Squamous Epithelial / LPF 0-5 (*)    All other components within normal limits  URINE CULTURE  LIPASE, BLOOD  CBC  TROPONIN I   ____________________________________________  EKG  ED ECG REPORT I, Hinda Kehr, the attending physician, personally viewed and interpreted this ECG.  Date: 06/30/2017 EKG Time: 20: 55 Rate: 61 Rhythm: normal sinus rhythm QRS Axis: normal Intervals: normal ST/T Wave abnormalities: normal Narrative Interpretation: no evidence of acute ischemia  ____________________________________________  RADIOLOGY   ED MD interpretation: Acute abnormalities identified by the radiologist on either the CT scan nor the ultrasound  Official radiology report(s): Ct Abdomen Pelvis W Contrast  Result Date: 06/30/2017 CLINICAL DATA:  Acute presentation with abdominal pain and nausea. Constipation. EXAM: CT ABDOMEN AND PELVIS WITH CONTRAST TECHNIQUE: Multidetector CT imaging of the abdomen and pelvis was performed using the standard protocol following bolus administration of intravenous contrast. CONTRAST:  31mL ISOVUE-370 IOPAMIDOL (ISOVUE-370) INJECTION 76% COMPARISON:  11/20/2013 FINDINGS: Lower chest: Normal Hepatobiliary: Normal Pancreas: Normal Spleen: Normal Adrenals/Urinary Tract: Adrenal glands are normal. Kidneys are normal except for a 1 cm cyst in the lateral midportion of the right kidney. No stone disease. Stomach/Bowel: No abnormal bowel finding. No evidence of obstruction or inflammatory process. No focal bowel lesion identified. Vascular/Lymphatic: Aortic atherosclerosis. No aneurysm. IVC  is normal. No retroperitoneal adenopathy. Reproductive: Previous hysterectomy.  No pelvic mass. Other: No free fluid or air. Musculoskeletal: Chronic lower lumbar degenerative changes and spinal curvature. IMPRESSION: No acute finding by CT. No evidence of bowel obstruction. No sign of inflammatory disease or other focal finding to explain the clinical presentation. Electronically Signed   By: Nelson Chimes M.D.   On: 06/30/2017 17:54   US Abdomen Limited Ruq  Result Date: 06/30/2017 CLINICAL DATA:  Right side abdominal pain for 2-3 days.  Nausea EXAM: ULTRASOUND ABDOMEN LIMITED RIGHT UPPER QUADRANT COMPARISON:  CT 06/30/2017 FINDINGS: Gallbladder: No gallstones or wall thickening visualized. No sonographic Murphy sign noted by sonographer.  Common bile duct: Diameter: Normal caliber, 4 mm. Liver: Increased echotexture compatible with fatty infiltration. No focal abnormality or biliary ductal dilatation. Portal vein is patent on color Doppler imaging with normal direction of blood flow towards the liver. IMPRESSION: Fatty infiltration of the liver.  No acute findings. Electronically Signed   By: Rolm Baptise M.D.   On: 06/30/2017 19:00    ____________________________________________   PROCEDURES  Critical Care performed: No   Procedure(s) performed:   Procedures   ____________________________________________   INITIAL IMPRESSION / ASSESSMENT AND PLAN / ED COURSE  As part of my medical decision making, I reviewed the following data within the [place] notes reviewed and incorporated, Labs reviewed  and EKG interpreted     Differential diagnosis includes, but is not limited to, ovarian cyst, ovarian torsion, diverticulitis, urinary tract infection/pyelonephritis, endometriosis, bowel obstruction, colitis, renal colic, gastroenteritis, hernia, fibroids, endometriosis, etc. I think the diverticulitis or biliary colic are the most likely culprits.  She has what  appears to be a mild urinary tract infection and I have sent a urine culture but it does not explain her symptoms.  She has no leukocytosis and her metabolic panel is reassuring and has a normal lipase.  Vital signs are stable.  After discussing the plan with the patient, we agreed upon a CT scan but she knows that she may benefit from a right upper quadrant ultrasound as well if the CT scan is nondiagnostic.  Morphine and Zofran for symptom control.    Clinical Course as of Jun 30 2098  Wed Jun 30, 2017  1803 No acute abnormalities identified on CT scan.  Will discuss with patient regarding how to proceed. CT Abdomen Pelvis W Contrast [CF]  5361 Although it is reassuring that the CT scan is normal, the patient remains very concerned about her right-sided abdominal pain.  She is quite concerned it may be her gallbladder.  Given that the CT scan is not the best modality for evaluation of the gallbladder and biliary system, I agreed to her request that we obtain an ultrasound for further evaluation.  If this is also reassuring with no evidence of any acute or emergent abnormalities, I told her it will most likely be the case that she will follow-up as an outpatient with some pain medication and stool softeners.  She understands and agrees with the plan  [CF]  2021 The patient still reports pain but both her CT scan and her ultrasound of been unremarkable.  All of her labs are normal and her vital signs are normal.  I explained that there is no other imaging study or recommendation that I have right now and that we need to try to treat the pain and discomfort.  I did give strict return precautions if she develops new or worsening symptoms.  She understands and agrees with plan.  [CF]  2022 Of note, the radiologist mentions nothing to suggest abnormalities that would be indicative of mesenteric ischemia and there is no reason to think that she would be suffering from mesenteric ischemia or any other vascular  abnormality  [CF]    Clinical Course User Index [CF] Hinda Kehr, MD    ____________________________________________  FINAL CLINICAL IMPRESSION(S) / ED DIAGNOSES  Final diagnoses:  Abdominal pain, unspecified abdominal location     MEDICATIONS GIVEN DURING THIS VISIT:  Medications  morphine 4 MG/ML injection 4 mg (4 mg Intravenous Given 06/30/17 1659)  ondansetron (ZOFRAN) injection 4 mg (4 mg Intravenous Given  06/30/17 1658)  iopamidol (ISOVUE-370) 76 % injection 75 mL (75 mLs Intravenous Contrast Given 06/30/17 1737)  oxyCODONE-acetaminophen (PERCOCET/ROXICET) 5-325 MG per tablet 2 tablet (2 tablets Oral Given 06/30/17 2058)     ED Discharge Orders        Ordered    oxyCODONE-acetaminophen (PERCOCET) 5-325 MG tablet  Every 6 hours PRN     06/30/17 2041    docusate sodium (COLACE) 100 MG capsule     06/30/17 2041       Note:  This document was prepared using Dragon voice recognition software and may include unintentional dictation errors.    Hinda Kehr, MD 06/30/17 2100

## 2017-06-30 NOTE — ED Notes (Signed)
First Nurse Note:  Patient complaining of abdominal pain,states it's her gall bladder.  Pain X 2 days.  Husband states they were seen at [facility] this AM.

## 2017-06-30 NOTE — ED Triage Notes (Signed)
Pt to ED via POV, pt was seen in the [facility] clinic this morning for abdominal pain and nausea. Pt was diagnosed with constipation and was given phenergan for nausea. Pt states that she called her PCP because her pain was getting worse and she was advised to come to the ED to have a CT scan done. Pt in NAD at this time.

## 2017-06-30 NOTE — ED Notes (Signed)
ED Provider at bedside. 

## 2017-06-30 NOTE — ED Notes (Signed)
Patient transported to CT 

## 2017-06-30 NOTE — Discharge Instructions (Signed)

## 2017-06-30 NOTE — Telephone Encounter (Signed)
Patient is having pain in URQ- stools have been gray- she was seen at [facility] today she had X-ray to rule out obstruction. She was given Phenergan, Cipro and Metronidazole. Patient can home and went to bed- she has gotten up and she still having pain and it is worse. Patient states she feels the same as she did last night. Patient has been hurting for 2 days- she took a laxative.She had 3-4 gray stool.She took miralax and now her stools are liquid brown. Patient is on clear liquids she has had nausea- but no vomiting. Patient states provider thinks she has diverticulitis or gallbladder attack. Advisement was if no better to go to hospital for CT scan. ( Dr Duanne Moron) Patient wants to know if the scan can be ordered by the office instead of her having to go through the ED department.

## 2017-06-30 NOTE — Telephone Encounter (Signed)
Spoke to Dana Obrien , patient is experiencing more RUQ pain and wants to know if Dr Caryl Bis will go ahead and order CT Scan.   She will advise patient to go to ED will be able to get pain meds and they can go ahead and order CT Scan.

## 2017-06-30 NOTE — Telephone Encounter (Signed)
Agree with the ED evaluation.

## 2017-07-02 DIAGNOSIS — M47816 Spondylosis without myelopathy or radiculopathy, lumbar region: Secondary | ICD-10-CM | POA: Diagnosis not present

## 2017-07-02 DIAGNOSIS — M419 Scoliosis, unspecified: Secondary | ICD-10-CM | POA: Diagnosis not present

## 2017-07-02 LAB — URINE CULTURE: Special Requests: NORMAL

## 2017-07-23 ENCOUNTER — Telehealth: Payer: Self-pay | Admitting: Radiology

## 2017-07-23 DIAGNOSIS — R7309 Other abnormal glucose: Secondary | ICD-10-CM

## 2017-07-23 DIAGNOSIS — I10 Essential (primary) hypertension: Secondary | ICD-10-CM

## 2017-07-23 NOTE — Telephone Encounter (Signed)
Pt coming in for labs Tuesday, please place future orders. Thank you.  

## 2017-07-25 NOTE — Telephone Encounter (Signed)
BMP ordered.  Please let the patient know that I ordered an A1c as well given recently elevated glucose.

## 2017-07-25 NOTE — Addendum Note (Signed)
Addended by: Caryl Bis, Jarren Para G on: 07/25/2017 10:03 AM   Modules accepted: Orders

## 2017-07-27 ENCOUNTER — Other Ambulatory Visit: Payer: PPO

## 2017-07-27 ENCOUNTER — Ambulatory Visit: Payer: PPO

## 2017-08-04 ENCOUNTER — Other Ambulatory Visit: Payer: PPO

## 2017-08-04 ENCOUNTER — Ambulatory Visit: Payer: PPO

## 2017-08-11 ENCOUNTER — Ambulatory Visit (INDEPENDENT_AMBULATORY_CARE_PROVIDER_SITE_OTHER): Payer: PPO

## 2017-08-11 ENCOUNTER — Other Ambulatory Visit (INDEPENDENT_AMBULATORY_CARE_PROVIDER_SITE_OTHER): Payer: PPO

## 2017-08-11 VITALS — BP 146/96 | HR 77

## 2017-08-11 DIAGNOSIS — R7309 Other abnormal glucose: Secondary | ICD-10-CM | POA: Diagnosis not present

## 2017-08-11 DIAGNOSIS — I10 Essential (primary) hypertension: Secondary | ICD-10-CM | POA: Diagnosis not present

## 2017-08-11 DIAGNOSIS — Z23 Encounter for immunization: Secondary | ICD-10-CM | POA: Diagnosis not present

## 2017-08-11 LAB — BASIC METABOLIC PANEL
BUN: 13 mg/dL (ref 6–23)
CHLORIDE: 100 meq/L (ref 96–112)
CO2: 31 mEq/L (ref 19–32)
Calcium: 9.6 mg/dL (ref 8.4–10.5)
Creatinine, Ser: 0.76 mg/dL (ref 0.40–1.20)
GFR: 79.97 mL/min (ref >60.00)
Glucose, Bld: 97 mg/dL (ref 70–99)
POTASSIUM: 3.8 meq/L (ref 3.5–5.1)
SODIUM: 139 meq/L (ref 135–145)

## 2017-08-11 LAB — HEMOGLOBIN A1C: Hgb A1c MFr Bld: 6.2 % (ref 4.6–6.5)

## 2017-08-11 NOTE — Progress Notes (Signed)
Patient comes in for blood pressure check she hasn't taken HCTZ this morning due to always takes with food and she has lab work this am . Blood pressure in left arm 134/88 left arm blood pressure in right arm 146/96.   Marland Kitchen  Patient has been under a lot of stress due to husband recently passed away.   Blood pressure readings at home averaging 134/78 -154/84.   She denies  swelling , headache, or chest pain.   Let patient sit for 5 minutes and blood pressure was 136/86 on left arm.   Also given flu shot

## 2017-08-12 NOTE — Progress Notes (Signed)
Note reviewed.  I would suggest we have her come back in on a day where she is taking her hydrochlorothiazide and recheck her blood pressure.  Please get her arranged for that visit.

## 2017-08-13 ENCOUNTER — Encounter: Payer: Self-pay | Admitting: Family Medicine

## 2017-08-17 NOTE — Progress Notes (Signed)
Patient has appointment for blood pressure check on 08/19/17

## 2017-08-19 ENCOUNTER — Ambulatory Visit (INDEPENDENT_AMBULATORY_CARE_PROVIDER_SITE_OTHER): Payer: PPO

## 2017-08-19 VITALS — BP 130/76 | HR 87

## 2017-08-19 DIAGNOSIS — I10 Essential (primary) hypertension: Secondary | ICD-10-CM | POA: Diagnosis not present

## 2017-08-19 DIAGNOSIS — G4733 Obstructive sleep apnea (adult) (pediatric): Secondary | ICD-10-CM | POA: Diagnosis not present

## 2017-08-19 NOTE — Progress Notes (Addendum)
Patient comes in today for a blood pressure check. Patient came in on 08/11/2017 for a blood [resure check and her blood pressure in her left arm was 134/88 and right arm was 154/84. Patient had not taken her hydrochlorothiazide that day and was advised by PCP to come back in on a day she had taken the hydrochlorothiazide. Patients blood pressure today in left arm is 130/76 and pulse is 87. She took hydrochlorothiazide this morning.  Patient states she has had a sensation of "pins and needles" in both arms for about 5-6 days. She states it is the same sensation as your foot when it falls asleep. I have scheduled patient for an appointment with PCP on 08/24/17 at 315 for evaluation.Patient denies any SOB or chest pain, she states it just simply tingles with a slight ache but no pain.Informed patient to be evaluated sooner if she start to develop new symptoms or worsening symptoms. Patient verbalized understanding.  Reviewed above information.  Tried to call pt to discuss her current issues.  Unable to reach.  Agree with need for evaluation.    Dr Nicki Reaper

## 2017-08-20 ENCOUNTER — Ambulatory Visit
Admission: EM | Admit: 2017-08-20 | Discharge: 2017-08-20 | Disposition: A | Discharge status: Home / Self Care | Payer: PPO | Attending: Family Medicine | Admitting: Family Medicine

## 2017-08-20 DIAGNOSIS — M5030 Other cervical disc degeneration, unspecified cervical region: Secondary | ICD-10-CM | POA: Diagnosis not present

## 2017-08-20 DIAGNOSIS — M5010 Cervical disc disorder with radiculopathy, unspecified cervical region: Secondary | ICD-10-CM | POA: Diagnosis not present

## 2017-08-20 NOTE — Progress Notes (Signed)
Called patient and spoke with her regarding the tingling symptoms that she reported yesterday.  She reports for the last week she has had bilateral arm tingling that encompasses her whole arm bilaterally and extends down to her fingers.  She notes she woke up with this.  Notes it does not go away or improve.  She notes no decreased sensation or weakness.  She notes no other neurological symptoms.  She notes no neck pain.  Discussed that she should be evaluated for this prior to her visit next week and I encouraged her to go to the walk-in clinic or urgent care for evaluation.  She will keep her appointment with me next week.  She voiced understanding.

## 2017-08-20 NOTE — Progress Notes (Signed)
Tried to call pt to clarify her current symptoms.   Unable to reach.  See note.  Agree with need for evaluation.

## 2017-08-20 NOTE — ED Triage Notes (Signed)
Pt said for a week or 2, said her bp was elevated having bilateral arm pain, numbness, tingling but no weakness. Said it's an achy feeling kind of pins and needles pain. Said it's constant. Did not try any otc medications.

## 2017-08-20 NOTE — ED Provider Notes (Addendum)
[facility]    CSN: [id number] Arrival date & time: 08/20/17  1535     History   Chief Complaint Chief Complaint  Patient presents with  . Neurologic Problem    HPI WILETTA BERMINGHAM is a 72 y.o. female.   The history is provided by the patient.  Neurologic Problem  This is a new (paresthesias both arms) problem. The current episode started more than 2 days ago (4 days). The problem occurs constantly. The problem has not changed since onset.Pertinent negatives include no chest pain, no abdominal pain, no headaches and no shortness of breath. Nothing aggravates the symptoms. Nothing relieves the symptoms.    Past Medical History:  Diagnosis Date  . Anxiety   . Arthritis   . Asthma   . Cancer [facility]) 2004   right lumpectomy, chemotherapy and radiation Dr. Jeb Levering  . Depression   . GERD (gastroesophageal reflux disease)   . Hypertension   . Migraine   . Shingles   . Sleep apnea    uses CPAP, severe OSA  . Vitamin D deficiency    IN THE PAST    Patient Active Problem List   Diagnosis Date Noted  . Hearing difficulty of both ears 06/25/2017  . Chronic sinusitis 11/05/2016  . GERD (gastroesophageal reflux disease) 09/07/2016  . Cough 07/08/2016  . Exertional shortness of breath 01/15/2016  . OSA (obstructive sleep apnea) 11/01/2015  . Low back pain 01/01/2015  . Varicose vein of leg 11/22/2014  . Post herpetic neuralgia 03/06/2014  . Cystocele 10/25/2013  . Skin lesion 10/25/2013  . Obesity, unspecified 10/19/2012  . History of breast cancer 02/11/2012  . Hyperlipidemia 05/11/2011  . Osteopenia 05/11/2011  . Anxiety and depression 03/04/2011  . Hypertension 03/04/2011    Past Surgical History:  Procedure Laterality Date  . ABDOMINAL HYSTERECTOMY    . ANTERIOR AND POSTERIOR REPAIR    . BREAST BIOPSY Right    stereo prior to lumpectomy  . BREAST EXCISIONAL BIOPSY Right 2004   chemo and rad  . ETHMOIDECTOMY Bilateral 11/05/2016   Procedure:  ETHMOIDECTOMY;  Surgeon: Melida Quitter, MD;  Location: [place];  Service: ENT;  Laterality: Bilateral;  . MAXILLARY ANTROSTOMY Bilateral 11/05/2016   Procedure: MAXILLARY ANTROSTOMY;  Surgeon: Melida Quitter, MD;  Location: [place];  Service: ENT;  Laterality: Bilateral;  . SINUS ENDO W/FUSION Bilateral 11/05/2016   Procedure: FRONTAL RECESS EXPLORATION;  Surgeon: Melida Quitter, MD;  Location: [place];  Service: ENT;  Laterality: Bilateral;  BILATERAL ENDOSCOPIC SINUS SURGERY WITH FUSION  . SINUS EXPLORATION  11/05/2016  . SPHENOIDECTOMY Bilateral 11/05/2016   Procedure: SPHENOIDECTOMY;  Surgeon: Melida Quitter, MD;  Location: [facility] OR;  Service: ENT;  Laterality: Bilateral;    OB History   None      Home Medications    Prior to Admission medications   Medication Sig Start Date End Date Taking? Authorizing Provider  albuterol (PROVENTIL HFA;VENTOLIN HFA) 108 (90 Base) MCG/ACT inhaler Inhale 2 puffs into the lungs every 6 (six) hours as needed. 06/25/17  Yes Leone Haven, MD  ALPRAZolam Duanne Moron) 0.5 MG tablet Take 1 tablet (0.5 mg total) by mouth 2 (two) times daily as needed for anxiety. 06/25/17  Yes Leone Haven, MD  Azelastine-Fluticasone 137-50 MCG/ACT SUSP Place 1 spray into the nose 2 (two) times daily. 10/19/16  Yes Kasa, Maretta Bees, MD  buPROPion (WELLBUTRIN XL) 150 MG 24 hr tablet Take 1 tablet (150 mg total) by mouth daily. 06/25/17  Yes Leone Haven,  MD  celecoxib (CELEBREX) 100 MG capsule Take 1 capsule (100 mg total) by mouth 2 (two) times daily. 12/09/16  Yes Leone Haven, MD  cetirizine (ZYRTEC) 10 MG tablet Take 1 tablet (10 mg total) by mouth daily. 01/01/17  Yes Leone Haven, MD  docusate sodium (COLACE) 100 MG capsule Take 1 tablet once or twice daily as needed for constipation while taking narcotic pain medicine 06/30/17  Yes Hinda Kehr, MD  FLUoxetine (PROZAC) 20 MG capsule TAKE THREE CAPSULES BY MOUTH ONCE DAILY 06/25/17  Yes Leone Haven, MD    hydrochlorothiazide (HYDRODIURIL) 12.5 MG tablet Take 1 tablet (12.5 mg total) by mouth daily. 06/25/17  Yes Leone Haven, MD  montelukast (SINGULAIR) 10 MG tablet Take 1 tablet (10 mg total) by mouth at bedtime. 07/08/16  Yes Leone Haven, MD  Multiple Vitamin (MULTIVITAMIN) capsule Take 1 capsule by mouth daily.   Yes [provider]  oxyCODONE-acetaminophen (PERCOCET) 5-325 MG tablet Take 1-2 tablets by mouth every 6 (six) hours as needed for severe pain. 06/30/17  Yes Hinda Kehr, MD  pantoprazole (PROTONIX) 40 MG tablet Take 1 tablet (40 mg total) by mouth daily. 01/01/17  Yes Leone Haven, MD  rosuvastatin (CRESTOR) 20 MG tablet Take 1 tablet (20 mg total) by mouth daily. 01/01/17  Yes Leone Haven, MD  traMADol (ULTRAM) 50 MG tablet Take 1 tablet (50 mg total) by mouth every 6 (six) hours as needed for moderate pain. 04/30/17  Yes Leone Haven, MD  tretinoin (RETIN-A) 0.05 % cream Apply 1 application topically at bedtime. 10/07/15  Yes Jackolyn Confer, MD    Family History Family History  Problem Relation Age of Onset  . Cancer Sister        COLON    Social History Social History   Tobacco Use  . Smoking status: Never Smoker  . Smokeless tobacco: Never Used  Substance Use Topics  . Alcohol use: Yes    Comment: rarely  . Drug use: No     Allergies   Codeine; Morphine; Niacin; and Niacin and related   Review of Systems Review of Systems  Constitutional: Negative.  Negative for chills, fatigue, fever and unexpected weight change.  HENT: Negative for congestion, ear discharge, ear pain, rhinorrhea, sinus pressure, sneezing and sore throat.   Eyes: Negative for photophobia, pain, discharge, redness and itching.  Respiratory: Negative for cough, shortness of breath, wheezing and stridor.   Cardiovascular: Negative for chest pain.  Gastrointestinal: Negative for abdominal pain, blood in stool, constipation, diarrhea, nausea and vomiting.   Endocrine: Negative for cold intolerance, heat intolerance, polydipsia, polyphagia and polyuria.  Genitourinary: Negative for dysuria, flank pain, frequency, hematuria, menstrual problem, pelvic pain, urgency, vaginal bleeding and vaginal discharge.  Musculoskeletal: Negative for arthralgias, back pain and myalgias.  Skin: Negative for rash.  Allergic/Immunologic: Negative for environmental allergies and food allergies.  Neurological: Negative for dizziness, weakness, light-headedness, numbness and headaches.  Hematological: Negative for adenopathy. Does not bruise/bleed easily.  Psychiatric/Behavioral: Negative for dysphoric mood. The patient is not nervous/anxious.      Physical Exam Triage Vital Signs ED Triage Vitals  Enc Vitals Group     BP 08/20/17 1605 130/67     Pulse Rate 08/20/17 1605 85     Resp 08/20/17 1605 18     Temp 08/20/17 1605 98.8 F (37.1 C)     Temp Source 08/20/17 1605 Oral     SpO2 08/20/17 1605 96 %  Weight --      Height --      Head Circumference --      Peak Flow --      Pain Score 08/20/17 1609 7     Pain Loc --      Pain Edu? --      Excl. in [place]? --    No data found.  Updated Vital Signs BP 130/67 (BP Location: Left Arm)   Pulse 85   Temp 98.8 F (37.1 C) (Oral)   Resp 18   SpO2 96%   Visual Acuity Right Eye Distance:   Left Eye Distance:   Bilateral Distance:    Right Eye Near:   Left Eye Near:    Bilateral Near:     Physical Exam  Constitutional: She is oriented to person, place, and time. She appears well-developed and well-nourished.  HENT:  Head: Normocephalic.  Right Ear: External ear normal.  Left Ear: External ear normal.  Mouth/Throat: Oropharynx is clear and moist.  Eyes: Pupils are equal, round, and reactive to light. Conjunctivae and EOM are normal. Lids are everted and swept, no foreign bodies found. Left eye exhibits no hordeolum. No foreign body present in the left eye. Right conjunctiva is not injected. Left  conjunctiva is not injected. No scleral icterus.  Neck: Normal range of motion. Neck supple. No JVD present. No tracheal deviation present. No thyromegaly present.  Cardiovascular: Normal rate, regular rhythm, normal heart sounds and intact distal pulses. Exam reveals no gallop and no friction rub.  No murmur heard. Pulmonary/Chest: Effort normal and breath sounds normal. No respiratory distress. She has no wheezes. She has no rales.  Abdominal: Soft. She exhibits no mass. There is no hepatosplenomegaly. There is no tenderness. There is no rebound and no guarding.  Musculoskeletal: Normal range of motion. She exhibits no edema or tenderness.  Lymphadenopathy:    She has no cervical adenopathy.  Neurological: She is alert and oriented to person, place, and time. She has normal strength. She displays normal reflexes. No cranial nerve deficit.  Skin: Skin is warm. No rash noted.  Psychiatric: She has a normal mood and affect. Her mood appears not anxious. She does not exhibit a depressed mood.     UC Treatments / Results  Labs (all labs ordered are listed, but only abnormal results are displayed) Labs Reviewed - No data to display  EKG None Radiology No results found.  Procedures Procedures (including critical care time)  Medications Ordered in UC Medications - No data to display   Initial Impression / Assessment and Plan / UC Course  I have reviewed the triage vital signs and the nursing notes.  Pertinent labs & imaging results that were available during my care of the patient were reviewed by me and considered in my medical decision making (see chart for details).       Final Clinical Impressions(s) / UC Diagnoses   Final diagnoses:  Cervical disc disorder with radiculopathy of cervical region  DDD (degenerative disc disease), cervical    ED Discharge Orders    None       Controlled Substance Prescriptions  Controlled Substance Registry consulted?    Juline Patch, MD 08/20/17 1737    Juline Patch, MD 08/20/17 1739

## 2017-08-24 ENCOUNTER — Encounter: Payer: Self-pay | Admitting: Family Medicine

## 2017-08-24 ENCOUNTER — Ambulatory Visit (INDEPENDENT_AMBULATORY_CARE_PROVIDER_SITE_OTHER): Payer: PPO

## 2017-08-24 ENCOUNTER — Ambulatory Visit (INDEPENDENT_AMBULATORY_CARE_PROVIDER_SITE_OTHER): Payer: PPO | Admitting: Family Medicine

## 2017-08-24 DIAGNOSIS — K760 Fatty (change of) liver, not elsewhere classified: Secondary | ICD-10-CM

## 2017-08-24 DIAGNOSIS — M5032 Other cervical disc degeneration, mid-cervical region, unspecified level: Secondary | ICD-10-CM | POA: Diagnosis not present

## 2017-08-24 DIAGNOSIS — M5412 Radiculopathy, cervical region: Secondary | ICD-10-CM

## 2017-08-24 DIAGNOSIS — R1011 Right upper quadrant pain: Secondary | ICD-10-CM | POA: Diagnosis not present

## 2017-08-24 DIAGNOSIS — R109 Unspecified abdominal pain: Secondary | ICD-10-CM | POA: Insufficient documentation

## 2017-08-24 DIAGNOSIS — M5410 Radiculopathy, site unspecified: Secondary | ICD-10-CM | POA: Insufficient documentation

## 2017-08-24 NOTE — Assessment & Plan Note (Signed)
Noted on recent ultrasound.  LFTs were normal.  Discussed working on diet and exercise and periodically monitoring her LFTs.

## 2017-08-24 NOTE — Patient Instructions (Signed)
Nice to see you. We will get an x-ray of your neck and determine the next step in management. Please try to maintain a healthy diet and work on diet and exercise.

## 2017-08-24 NOTE — Assessment & Plan Note (Signed)
Symptoms most likely represent radiculopathy related to nerve impingement though it seems a little odd that it involves the entire arm on both sides.  It does not seem consistent with neuropathy.  Symptoms are not consistent with a central process.  Will obtain an x-ray of her neck and then determine if she needs an MRI.  Could consider neurology evaluation if imaging is unrevealing.  She is given return precautions.

## 2017-08-24 NOTE — Progress Notes (Signed)
[name], MD Phone: [phone]  Dana Obrien is a 72 y.o. female who presents today for same visit.  She notes for the last 2 or so weeks she has felt the tingling and aching sensation in her bilateral arms.  Goes from just below her shoulder down to her hands.  She notes no neck pain.  Notes it is the whole arm bilaterally.  She has had no injuries.  No numbness or weakness.  Does have a history of nerve inflammation per her report in the past for which she received Neurontin.  She also reports that she was seen in the emergency department for abdominal discomfort.  She had no other symptoms with it other than right upper quadrant pain.  She had an extensive evaluation which did not reveal a cause.  The discomfort went away after several days.  It has not recurred.  Of note ultrasound ended up revealing fatty liver.  She just recently went back to low carbohydrate low sugar diet and plans to stick with that.  She tries to stay active.  Social History   Tobacco Use  Smoking Status Never Smoker  Smokeless Tobacco Never Used     ROS see history of present illness  Objective  Physical Exam Vitals:   08/24/17 1508  BP: 128/80  Pulse: 76  Temp: 98 F (36.7 C)  SpO2: 97%    BP Readings from Last 3 Encounters:  08/24/17 128/80  08/20/17 130/67  08/19/17 130/76   Wt Readings from Last 3 Encounters:  08/24/17 207 lb 9.6 oz (94.2 kg)  06/30/17 200 lb (90.7 kg)  06/25/17 203 lb 6.4 oz (92.3 kg)    Physical Exam  Constitutional: No distress.  Cardiovascular: Normal rate, regular rhythm and normal heart sounds.  Pulmonary/Chest: Effort normal and breath sounds normal.  Abdominal: Soft. Bowel sounds are normal. She exhibits no distension. There is no tenderness. There is no rebound and no guarding.  Musculoskeletal: She exhibits no edema.  Neurological: She is alert. Gait normal.  CN 2-12 intact, 5/5 strength in bilateral biceps, triceps, grip, quads, hamstrings,  plantar and dorsiflexion, sensation to light touch intact in bilateral UE and LE, negative Spurling's  Skin: Skin is warm and dry. She is not diaphoretic.     Assessment/Plan: Please see individual problem list.  Radiculopathy Symptoms most likely represent radiculopathy related to nerve impingement though it seems a little odd that it involves the entire arm on both sides.  It does not seem consistent with neuropathy.  Symptoms are not consistent with a central process.  Will obtain an x-ray of her neck and then determine if she needs an MRI.  Could consider neurology evaluation if imaging is unrevealing.  She is given return precautions.    Fatty liver Noted on recent ultrasound.  LFTs were normal.  Discussed working on diet and exercise and periodically monitoring her LFTs.  Abdominal pain Extensive evaluation in the emergency department with no obvious cause.  This has not recurred.  If it does recur she will be reevaluated.  Orders Placed This Encounter  Procedures  . DG Cervical Spine Complete    Standing Status:   Future    Number of Occurrences:   1    Standing Expiration Date:   10/25/2018    Order Specific Question:   Reason for Exam (SYMPTOM  OR DIAGNOSIS REQUIRED)    Answer:   bilateral arm radiculopathy    Order Specific Question:   Preferred imaging location?  Answer:   [facility] Specific Question:   Radiology Contrast Protocol - do NOT remove file path    Answer:   [email or web address]    No orders of the defined types were placed in this encounter.    [name], MD [place]

## 2017-08-24 NOTE — Assessment & Plan Note (Signed)
Extensive evaluation in the emergency department with no obvious cause.  This has not recurred.  If it does recur she will be reevaluated.

## 2017-08-25 ENCOUNTER — Ambulatory Visit
Admission: RE | Admit: 2017-08-25 | Discharge: 2017-08-25 | Disposition: A | Discharge status: Home / Self Care | Payer: PPO | Source: Ambulatory Visit | Attending: Family Medicine | Admitting: Family Medicine

## 2017-08-25 DIAGNOSIS — Z1231 Encounter for screening mammogram for malignant neoplasm of breast: Secondary | ICD-10-CM | POA: Insufficient documentation

## 2017-08-25 DIAGNOSIS — Z1239 Encounter for other screening for malignant neoplasm of breast: Secondary | ICD-10-CM

## 2017-08-25 DIAGNOSIS — R928 Other abnormal and inconclusive findings on diagnostic imaging of breast: Secondary | ICD-10-CM | POA: Insufficient documentation

## 2017-08-25 HISTORY — DX: Personal history of antineoplastic chemotherapy: Z92.21

## 2017-08-25 HISTORY — DX: Personal history of irradiation: Z92.3

## 2017-08-25 HISTORY — DX: Malignant neoplasm of unspecified site of unspecified female breast: C50.919

## 2017-08-26 ENCOUNTER — Other Ambulatory Visit: Payer: Self-pay | Admitting: Family Medicine

## 2017-08-26 DIAGNOSIS — R928 Other abnormal and inconclusive findings on diagnostic imaging of breast: Secondary | ICD-10-CM

## 2017-08-26 DIAGNOSIS — N6320 Unspecified lump in the left breast, unspecified quadrant: Secondary | ICD-10-CM

## 2017-08-27 ENCOUNTER — Telehealth: Payer: Self-pay

## 2017-08-27 MED ORDER — FLUCONAZOLE 150 MG PO TABS: 150.0000 mg | ORAL_TABLET | Freq: Once | ORAL | 0 refills | Status: AC

## 2017-08-27 NOTE — Telephone Encounter (Signed)
Please contact the patient.  I apologize that she has not heard yet.  The breast center should have contacted her.  Please let her know that there was an abnormality seen in the left breast and that we will contact the breast center to get her set up for the follow-up mammogram.

## 2017-08-27 NOTE — Telephone Encounter (Signed)
Copied from [place] [id number]. Topic: Quick Communication - Other Results >> Aug 27, 2017  8:37 AM Synthia Innocent wrote: Requesting to speak to nurse regarding mammogram results >> Aug 27, 2017  8:58 AM Conception Chancy, NT wrote: She states she read that she had a abnormal mammogram but no one had contact her. She would like if a nurse could schedule the 2nd mammogram and possible ultra sound on left breast done today. Please advise.

## 2017-08-27 NOTE — Telephone Encounter (Signed)
Patient notified and states she saw it on mychart

## 2017-08-27 NOTE — Addendum Note (Signed)
Addended by: Caryl Bis, Saydi Kobel G on: 08/27/2017 11:07 AM   Modules accepted: Orders

## 2017-08-27 NOTE — Telephone Encounter (Signed)
Please advise 

## 2017-08-27 NOTE — Telephone Encounter (Signed)
Noted. I will forward to Melissa to ensure this gets set up. Thanks.

## 2017-08-28 ENCOUNTER — Other Ambulatory Visit: Payer: Self-pay | Admitting: Family Medicine

## 2017-08-28 DIAGNOSIS — M5412 Radiculopathy, cervical region: Secondary | ICD-10-CM

## 2017-08-30 NOTE — Telephone Encounter (Signed)
-----   Message from Leone Haven, MD sent at 08/28/2017  1:55 PM EDT ----- Ordered. Will forward to [facility] to see if they can determine the cost prior to the scan.

## 2017-08-30 NOTE — Telephone Encounter (Signed)
Pt called this morning. Lorriane Shire gave pt [place] # and was told to ask for Aldona Bar since she is the one that schedules these appointments.

## 2017-08-31 ENCOUNTER — Other Ambulatory Visit: Payer: Self-pay | Admitting: Family Medicine

## 2017-08-31 ENCOUNTER — Ambulatory Visit
Admission: RE | Admit: 2017-08-31 | Discharge: 2017-08-31 | Disposition: A | Discharge status: Home / Self Care | Payer: PPO | Source: Ambulatory Visit | Attending: Family Medicine | Admitting: Family Medicine

## 2017-08-31 DIAGNOSIS — R928 Other abnormal and inconclusive findings on diagnostic imaging of breast: Secondary | ICD-10-CM

## 2017-08-31 DIAGNOSIS — N6321 Unspecified lump in the left breast, upper outer quadrant: Secondary | ICD-10-CM | POA: Diagnosis not present

## 2017-08-31 DIAGNOSIS — N6320 Unspecified lump in the left breast, unspecified quadrant: Secondary | ICD-10-CM

## 2017-09-01 ENCOUNTER — Encounter (INDEPENDENT_AMBULATORY_CARE_PROVIDER_SITE_OTHER): Payer: Self-pay

## 2017-09-02 ENCOUNTER — Encounter: Payer: Self-pay | Admitting: Family Medicine

## 2017-09-03 ENCOUNTER — Other Ambulatory Visit: Payer: PPO

## 2017-09-03 ENCOUNTER — Ambulatory Visit: Payer: PPO

## 2017-09-07 ENCOUNTER — Ambulatory Visit
Admission: RE | Admit: 2017-09-07 | Discharge: 2017-09-07 | Disposition: A | Discharge status: Home / Self Care | Payer: PPO | Source: Ambulatory Visit | Attending: Family Medicine | Admitting: Family Medicine

## 2017-09-07 ENCOUNTER — Other Ambulatory Visit: Payer: PPO

## 2017-09-07 ENCOUNTER — Ambulatory Visit: Payer: PPO

## 2017-09-07 DIAGNOSIS — M5412 Radiculopathy, cervical region: Secondary | ICD-10-CM | POA: Insufficient documentation

## 2017-09-07 DIAGNOSIS — R928 Other abnormal and inconclusive findings on diagnostic imaging of breast: Secondary | ICD-10-CM

## 2017-09-07 DIAGNOSIS — C50412 Malignant neoplasm of upper-outer quadrant of left female breast: Secondary | ICD-10-CM | POA: Diagnosis not present

## 2017-09-07 DIAGNOSIS — N6320 Unspecified lump in the left breast, unspecified quadrant: Secondary | ICD-10-CM

## 2017-09-07 DIAGNOSIS — M4802 Spinal stenosis, cervical region: Secondary | ICD-10-CM | POA: Insufficient documentation

## 2017-09-07 DIAGNOSIS — M1288 Other specific arthropathies, not elsewhere classified, other specified site: Secondary | ICD-10-CM | POA: Diagnosis not present

## 2017-09-07 DIAGNOSIS — N6321 Unspecified lump in the left breast, upper outer quadrant: Secondary | ICD-10-CM | POA: Diagnosis not present

## 2017-09-07 HISTORY — PX: BREAST BIOPSY: SHX20

## 2017-09-08 ENCOUNTER — Ambulatory Visit
Admission: RE | Admit: 2017-09-08 | Discharge: 2017-09-08 | Disposition: A | Discharge status: Home / Self Care | Payer: PPO | Source: Ambulatory Visit | Attending: Family Medicine | Admitting: Family Medicine

## 2017-09-08 ENCOUNTER — Other Ambulatory Visit: Payer: Self-pay | Admitting: Family Medicine

## 2017-09-08 DIAGNOSIS — C50412 Malignant neoplasm of upper-outer quadrant of left female breast: Secondary | ICD-10-CM | POA: Diagnosis not present

## 2017-09-08 DIAGNOSIS — M5022 Other cervical disc displacement, mid-cervical region, unspecified level: Secondary | ICD-10-CM | POA: Diagnosis not present

## 2017-09-08 DIAGNOSIS — C50912 Malignant neoplasm of unspecified site of left female breast: Secondary | ICD-10-CM

## 2017-09-08 DIAGNOSIS — M5412 Radiculopathy, cervical region: Secondary | ICD-10-CM

## 2017-09-09 NOTE — Progress Notes (Addendum)
  Oncology Nurse Navigator Documentation  Navigator Location: [facility] (09/09/17 1000)   )Navigator Encounter Type: Introductory phone call;Telephone (09/09/17 1000)   Abnormal Finding Date: 08/31/17 (09/09/17 1000) Confirmed Diagnosis Date: 09/07/17 (09/09/17 1000)               Patient Visit Type: Initial (09/09/17 1000) Treatment Phase: Pre-Tx/Tx Discussion (09/09/17 1000) Barriers/Navigation Needs: Education;Coordination of Care (09/09/17 1000)                          Time Spent with Patient: 30 (09/09/17 1000)   Phoned patient to introduce Navigation service. States she is grateful for the timeliness of her care.  Confirmed appointments with Dr. Bary Castilla, and Dr. Janese Banks.  Patient has history of right breast cancer in 2004. She saw Dr. Oliva Bustard, and Dr. Sharlet Salina at that time.  Received lumpectomy, chemotherapy, radiation and completed antihormonal x 5 years.   She will receive her Breast Cancer Treatment Handbook/folder with hospital services at her visit to [facility] tomorrow.    Patient reports  her husband passed away a few months ago "from a heart attack".  She  had hysterectomy in 2017, sinus surgery in 2018. "Feel like things keep happening".    State she has good support ,and strong faith.  Offered information on counseling service here at [facility].

## 2017-09-10 ENCOUNTER — Ambulatory Visit: Payer: Self-pay

## 2017-09-10 ENCOUNTER — Other Ambulatory Visit: Payer: Self-pay | Admitting: General Surgery

## 2017-09-10 ENCOUNTER — Telehealth: Payer: Self-pay

## 2017-09-10 ENCOUNTER — Other Ambulatory Visit: Payer: Self-pay

## 2017-09-10 ENCOUNTER — Encounter: Payer: Self-pay | Admitting: General Surgery

## 2017-09-10 ENCOUNTER — Ambulatory Visit (INDEPENDENT_AMBULATORY_CARE_PROVIDER_SITE_OTHER): Payer: PPO | Admitting: General Surgery

## 2017-09-10 VITALS — BP 126/68 | HR 74 | Resp 14 | Ht 64.0 in | Wt 213.0 lb

## 2017-09-10 DIAGNOSIS — C50412 Malignant neoplasm of upper-outer quadrant of left female breast: Secondary | ICD-10-CM

## 2017-09-10 DIAGNOSIS — N6321 Unspecified lump in the left breast, upper outer quadrant: Secondary | ICD-10-CM

## 2017-09-10 MED ORDER — LIDOCAINE-PRILOCAINE 2.5-2.5 % EX CREA: 1.0000 "application " | TOPICAL_CREAM | CUTANEOUS | 0 refills | Status: DC | PRN

## 2017-09-10 NOTE — Progress Notes (Signed)
Patient ID: Dana Obrien, female   DOB: 1945/07/11, 72 y.o.   MRN: [id number]  Chief Complaint  Patient presents with  . Other    HPI Dana Obrien is a 72 y.o. female who presents for a breast evaluation. The most recent mammogram was done on 08/25/2017 and left breast biopsy done on 09/08/2017.   Patient does perform regular self breast checks and gets regular mammograms done.Patiennt had radiation and chemo in 2004.  Dana Obrien and Dana Obrien where her physicians at that time.  She did not require a port for her chemotherapy.  She has had some significant neck pain and tingling.  Recent MRI showed evidence of significant nerve root compression.  Neurosurgery evaluation pending.    Her husband passed away in 08/06/22, from suspected heart attack.  They had been married for 49 years.    The patient is a retired Corporate treasurer, [employer] her career at hospice 3 years ago.  Daughter, Dana Obrien is present at visit. Daughter is a Marine scientist.   HPI  Past Medical History:  Diagnosis Date  . Anxiety   . Arthritis   . Asthma   . Breast cancer [facility]) 2004   right breast  . Cancer [facility]) 2004   right lumpectomy, chemotherapy and radiation Dr. Jeb Levering  . Depression   . GERD (gastroesophageal reflux disease)   . Hypertension   . Migraine   . Personal history of chemotherapy   . Personal history of radiation therapy   . Shingles   . Sleep apnea    uses CPAP, severe OSA  . Vitamin D deficiency    IN THE PAST    Past Surgical History:  Procedure Laterality Date  . ABDOMINAL HYSTERECTOMY    . ANTERIOR AND POSTERIOR REPAIR    . BREAST BIOPSY Right    stereo prior to lumpectomy  . BREAST BIOPSY Left 09/07/2017   US guided biopsy  . BREAST EXCISIONAL BIOPSY Right 2004   chemo and rad  . BREAST LUMPECTOMY Right 2004  . ETHMOIDECTOMY Bilateral 11/05/2016   Procedure: ETHMOIDECTOMY;  Surgeon: Melida Quitter, MD;  Location: [place];  Service: ENT;  Laterality: Bilateral;  . MAXILLARY ANTROSTOMY  Bilateral 11/05/2016   Procedure: MAXILLARY ANTROSTOMY;  Surgeon: Melida Quitter, MD;  Location: [place];  Service: ENT;  Laterality: Bilateral;  . SINUS ENDO W/FUSION Bilateral 11/05/2016   Procedure: FRONTAL RECESS EXPLORATION;  Surgeon: Melida Quitter, MD;  Location: [place];  Service: ENT;  Laterality: Bilateral;  BILATERAL ENDOSCOPIC SINUS SURGERY WITH FUSION  . SINUS EXPLORATION  11/05/2016  . SPHENOIDECTOMY Bilateral 11/05/2016   Procedure: SPHENOIDECTOMY;  Surgeon: Melida Quitter, MD;  Location: [facility] OR;  Service: ENT;  Laterality: Bilateral;    Family History  Problem Relation Age of Onset  . Cancer Sister        COLON  . Breast cancer Neg Hx     Social History Social History   Tobacco Use  . Smoking status: Never Smoker  . Smokeless tobacco: Never Used  Substance Use Topics  . Alcohol use: Yes    Comment: rarely  . Drug use: No    Allergies  Allergen Reactions  . Codeine Nausea Only and Nausea And Vomiting  . Morphine Nausea Only  . Niacin Rash  . Niacin And Related Rash    ANTIHYPERLIPEMICS    Current Outpatient Medications  Medication Sig Dispense Refill  . albuterol (PROVENTIL HFA;VENTOLIN HFA) 108 (90 Base) MCG/ACT inhaler Inhale 2 puffs into the lungs every 6 (six) hours  as needed. 1 Inhaler 3  . ALPRAZolam (XANAX) 0.5 MG tablet Take 1 tablet (0.5 mg total) by mouth 2 (two) times daily as needed for anxiety. 90 tablet 1  . Azelastine-Fluticasone 137-50 MCG/ACT SUSP Place 1 spray into the nose 2 (two) times daily. 23 g 4  . buPROPion (WELLBUTRIN XL) 150 MG 24 hr tablet Take 1 tablet (150 mg total) by mouth daily. 90 tablet 1  . celecoxib (CELEBREX) 100 MG capsule Take 1 capsule (100 mg total) by mouth 2 (two) times daily. 180 capsule 1  . cetirizine (ZYRTEC) 10 MG tablet Take 1 tablet (10 mg total) by mouth daily. 90 tablet 1  . docusate sodium (COLACE) 100 MG capsule Take 1 tablet once or twice daily as needed for constipation while taking narcotic pain medicine 30  capsule 0  . FLUoxetine (PROZAC) 20 MG capsule TAKE THREE CAPSULES BY MOUTH ONCE DAILY 90 capsule 1  . hydrochlorothiazide (HYDRODIURIL) 12.5 MG tablet Take 1 tablet (12.5 mg total) by mouth daily. 90 tablet 3  . montelukast (SINGULAIR) 10 MG tablet Take 1 tablet (10 mg total) by mouth at bedtime. 30 tablet 3  . Multiple Vitamin (MULTIVITAMIN) capsule Take 1 capsule by mouth daily.    . pantoprazole (PROTONIX) 40 MG tablet Take 1 tablet (40 mg total) by mouth daily. 90 tablet 2  . rosuvastatin (CRESTOR) 20 MG tablet Take 1 tablet (20 mg total) by mouth daily. 90 tablet 3  . traMADol (ULTRAM) 50 MG tablet Take 1 tablet (50 mg total) by mouth every 6 (six) hours as needed for moderate pain. 90 tablet 1  . tretinoin (RETIN-A) 0.05 % cream Apply 1 application topically at bedtime. 45 g 0   No current facility-administered medications for this visit.     Review of Systems Review of Systems  Constitutional: Negative.   Respiratory: Negative.   Cardiovascular: Negative.     Blood pressure 126/68, pulse 74, resp. rate 14, height 5' 4" (1.626 m), weight 213 lb (96.6 kg).  Physical Exam Physical Exam  Constitutional: She is oriented to person, place, and time. She appears well-developed and well-nourished.  Eyes: Conjunctivae are normal. No scleral icterus.  Neck: Neck supple.  Cardiovascular: Normal rate, regular rhythm and normal heart sounds.  Pulmonary/Chest: Effort normal and breath sounds normal. Right breast exhibits no inverted nipple, no mass, no nipple discharge, no skin change and no tenderness.    Lymphadenopathy:    She has no cervical adenopathy.    She has no axillary adenopathy.       Right: No supraclavicular adenopathy present.       Left: No supraclavicular adenopathy present.  Neurological: She is alert and oriented to person, place, and time.  Skin: Skin is warm and dry.    Data Reviewed August 25, 2017 screening mammogram showed a new density in the upper outer  quadrant of the left breast.  Prior films available for review are from 2016.  Ultrasound confirmed a solid nodule.  Axillary evaluation was negative.    Biopsy completed September 07, 2017 : DIAGNOSIS:  A. BREAST, LEFT, 2 O'CLOCK 5 CM FROM NIPPLE; ULTRASOUND-GUIDED CORE  BIOPSY:  - INVASIVE MAMMARY CARCINOMA, NO SPECIAL TYPE.   Size of invasive carcinoma: 11 mm in this sample  Histologic grade of invasive carcinoma: Grade 1    Glandular/tubular differentiation score: 2    Nuclear pleomorphism score: 1    Mitotic rate score: 1    Total score: 4   Ductal carcinoma in situ:  Not identified  Lymphovascular invasion: Not identified    Ultrasound examination of the left breast was undertaken to determine if preoperative wire localization would be required.  There is a well-defined hypoechoic mass in the 2 o'clock position of the left breast 5 cm from the nipple approximately 1.3 cm below the skin level with a corresponding biopsy clip within the lesion.  BI-RADS-6.   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  Plan The majority of the visit was spent reviewing the options for breast cancer treatment. Breast conservation with lumpectomy and radiation therapy  was presented as equivalent to mastectomy for long-term control. The pros and cons of each treatment regimen were reviewed. The indications for additional therapy such as chemotherapy were touched on briefly, realizing that the majority of information required to determine if chemotherapy would be of benefit is not available at this time.   The opportunity for second surgical opinion was reviewed.  Based on her past good experience with breast conservation in 2004, at this time she desires to proceed with breast conservation surgery.  Based on a review of her films and ultrasound imaging, I think there is little likelihood of a surprise tumor volume as she reportedly experienced in 2004.  (She was  told the lesion was the size of a dime but actually more the size of it or injure apricot).  Review of the 2004 films showed a 3-4 cm mass soma little perplexed on how the description of a dime size lesion came into discussion.  The breasts are almost all fatty replaced, and I think there is little to be gained by a preprocedure MRI.  The role of the use of EMLA cream the morning of surgery to minimize discomfort during sentinel node injection was discussed.  The patient will be contacted when receptor status is available.  At present, an appointment is scheduled with medical oncology on September 14, 2017.  Based on present tumor size, even if she was ER/PR negative/HER-2 positive or negative, I would not think she would make criteria for neoadjuvant chemotherapy.  HPI, Physical Exam, Assessment and Plan have been scribed under the direction and in the presence of Hervey Ard, MD.  Gaspar Cola, CMA  I have completed the exam and reviewed the above documentation for accuracy and completeness.  I agree with the above.  Haematologist has been used and any errors in dictation or transcription are unintentional.  Hervey Ard, M.D., F.A.C.S.  Dana Obrien 09/10/2017, 9:25 AM

## 2017-09-10 NOTE — Patient Instructions (Signed)
The patent is scheduled for surgery on 09/17/17 at [facility]. She will pre admit at the hospital. We will call her with an arrival time for her surgery day and also her pre admit date and time.

## 2017-09-10 NOTE — Telephone Encounter (Signed)
Call to patient to give them surgery arrival time and pre admit date and time. She will pre admit at the hospital on 09/13/17 at 2:15 pm. She will arrive for her surgery on 09/17/17 at the Radiology desk in the [facility] mall at 8:15 am. The patient is aware of dates, times, and instructions.

## 2017-09-13 ENCOUNTER — Encounter
Admission: RE | Admit: 2017-09-13 | Discharge: 2017-09-13 | Disposition: A | Discharge status: Home / Self Care | Payer: PPO | Source: Ambulatory Visit | Attending: General Surgery | Admitting: General Surgery

## 2017-09-13 ENCOUNTER — Other Ambulatory Visit: Payer: Self-pay

## 2017-09-13 ENCOUNTER — Other Ambulatory Visit: Payer: Self-pay | Admitting: Anatomic Pathology & Clinical Pathology

## 2017-09-13 DIAGNOSIS — Z01812 Encounter for preprocedural laboratory examination: Secondary | ICD-10-CM | POA: Diagnosis not present

## 2017-09-13 HISTORY — DX: Other specified postprocedural states: Z98.890

## 2017-09-13 HISTORY — DX: Nausea with vomiting, unspecified: R11.2

## 2017-09-13 LAB — CBC
HCT: 37 % (ref 35.0–47.0)
HEMOGLOBIN: 12.7 g/dL (ref 12.0–16.0)
MCH: 31.6 pg (ref 26.0–34.0)
MCHC: 34.2 g/dL (ref 32.0–36.0)
MCV: 92.4 fL (ref 80.0–100.0)
Platelets: 246 10*3/uL (ref 150–440)
RBC: 4.01 MIL/uL (ref 3.80–5.20)
RDW: 14.6 % — ABNORMAL HIGH (ref 11.5–14.5)
WBC: 8.4 10*3/uL (ref 3.6–11.0)

## 2017-09-13 LAB — BASIC METABOLIC PANEL
Anion gap: 9 (ref 5–15)
BUN: 18 mg/dL (ref 6–20)
CO2: 27 mmol/L (ref 22–32)
Calcium: 9.2 mg/dL (ref 8.9–10.3)
Chloride: 103 mmol/L (ref 101–111)
Creatinine, Ser: 0.64 mg/dL (ref 0.44–1.00)
GFR calc Af Amer: >60 mL/min (ref >60)
GFR calc non Af Amer: >60 mL/min (ref >60)
Glucose, Bld: 104 mg/dL — ABNORMAL HIGH (ref 65–99)
Potassium: 3.8 mmol/L (ref 3.5–5.1)
Sodium: 139 mmol/L (ref 135–145)

## 2017-09-13 LAB — SURGICAL PATHOLOGY

## 2017-09-13 NOTE — Patient Instructions (Addendum)
  Your procedure is scheduled on: Friday September 17, 2017  Remember: Instructions that are not followed completely may result in serious medical risk, up to and including death, or upon the discretion of your surgeon and anesthesiologist your surgery may need to be rescheduled.    _x___ 1. Do not eat food after midnight the night before your procedure. You may drink clear liquids up to 2 hours before you are scheduled to arrive at the hospital for your procedure.  Do not drink clear liquids within 2 hours of your scheduled arrival to the hospital.  Clear liquids include  --Water or Apple juice without pulp  --Clear carbohydrate beverage such as Gatorade  --Black Coffee or Clear Tea (No milk, no creamers, do not add anything to the coffee or tea  Type 1 and type 2 diabetics should only drink water.   No gum chewing or hard candies.      __x__ 2. No Alcohol for 24 hours before or after surgery.   __x__3. No Smoking or e-cigarettes for 24 prior to surgery.  Do not use any chewable tobacco products for at least 6 hour prior to surgery   ____  4. Bring all medications with you on the day of surgery if instructed.    __x__ 5. Notify your doctor if there is any change in your medical condition     (cold, fever, infections).   __x__6. On the morning of surgery brush your teeth with toothpaste and water.  You may rinse your mouth with mouth wash if you wish.  Do not swallow any toothpaste or mouthwash.   Do not wear jewelry, make-up, hairpins, clips or nail polish.  Do not wear lotions, powders, deodorant, or perfumes.  Do not shave 48 hours prior to surgery.  Do not bring valuables to the hospital.    [facility] is not responsible for any belongings or valuables.               Contacts, dentures or bridgework may not be worn into surgery.  Leave your suitcase in the car. After surgery it may be brought to your room.  For patients admitted to the hospital, discharge time is determined by  your treatment team.   Patients discharged the day of surgery will not be allowed to drive home.  You will need someone to drive you home and stay with you the night of your procedure.    Please read over the following fact sheets that you were given:   [facility] Preparing for Surgery and or MRSA Information   _x___ Take anti-hypertensive listed below, cardiac, seizure, asthma,     anti-reflux and psychiatric medicines. These include:  1. Bupropion/Wellbutrin  2. Celecoxib/Celebrex  3. Cetirizine/Zyrtec  4. Fluoxetine/Prozac  5. Pantoprazole/Protonix  6. Rosuvastatin/Crestor  _x___ Use CHG Soap or sage wipes as directed on instruction sheet   _x___ Use inhalers on the day of surgery and bring to hospital day of surgery  _x___ Follow recommendations from Cardiologist, Pulmonologist or PCP regarding stopping Aspirin, Coumadin, Plavix ,Eliquis, Effient, or Pradaxa, and Pletal.  _x___Stop Anti-inflammatories such as Advil, Aleve, Ibuprofen, Motrin, Naproxen, Naprosyn, Goodies powders or aspirin products. OK to take Tylenol and Celebrex.   _x___ Stop supplements until after surgery.  But may continue Vitamin D, Vitamin B, and multivitamin.  _x___ Bring C-Pap to the hospital.

## 2017-09-14 ENCOUNTER — Encounter: Payer: Self-pay | Admitting: Oncology

## 2017-09-14 ENCOUNTER — Inpatient Hospital Stay: Payer: PPO | Attending: Oncology | Admitting: Oncology

## 2017-09-14 ENCOUNTER — Inpatient Hospital Stay: Payer: PPO

## 2017-09-14 ENCOUNTER — Encounter: Payer: Self-pay | Admitting: *Deleted

## 2017-09-14 ENCOUNTER — Other Ambulatory Visit: Payer: Self-pay

## 2017-09-14 VITALS — BP 131/73 | HR 73 | Temp 98.2°F | Resp 18 | Ht 64.0 in | Wt 205.0 lb

## 2017-09-14 DIAGNOSIS — C50412 Malignant neoplasm of upper-outer quadrant of left female breast: Secondary | ICD-10-CM | POA: Diagnosis not present

## 2017-09-14 DIAGNOSIS — Z170 Estrogen receptor positive status [ER+]: Secondary | ICD-10-CM | POA: Diagnosis not present

## 2017-09-14 DIAGNOSIS — Z853 Personal history of malignant neoplasm of breast: Secondary | ICD-10-CM

## 2017-09-14 DIAGNOSIS — Z923 Personal history of irradiation: Secondary | ICD-10-CM

## 2017-09-14 DIAGNOSIS — Z7189 Other specified counseling: Secondary | ICD-10-CM

## 2017-09-14 DIAGNOSIS — Z9221 Personal history of antineoplastic chemotherapy: Secondary | ICD-10-CM | POA: Diagnosis not present

## 2017-09-14 NOTE — Progress Notes (Addendum)
Hematology/Oncology Consult note [facility] [phone] [phone]  Patient Care Team: Leone Haven, MD as PCP - General (Family Medicine)   Name of the patient: Dana Obrien  [id number]  1945/12/12    Reason for referral- new diagnosis of breast cancer   Referring physician- Dr. Doy Hutching  Date of visit: 09/14/17   History of presenting illness-patient is a 72 year old female with a prior history of right breast cancer in 2004 s/p lumpectomy followed by [facility] X4 and XRT. T2N0M0.  She thenn completed 5 years of aromasin until 2009. She could not tolerate arimidex.  Recent screening mammogram on 08/25/2017 showed a possible mass in her left breast.  This was followed by an ultrasound and a diagnostic left mammogram which showed a 0.7 x 0.5 x 0.7 cm mass in the left breast.  No evidence of left axillary adenopathy.  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  ER greater than 90% positive, PR greater than 90% positive and HER-2/neu negative   Patient has been seen by Dr. Bary Castilla and is scheduled to undergo surgery on 09/17/2017  She is G2P2L2. Menarche at the age of 44. Menopause in her early 98's. She used to take prempro from [age] yrs but none since then. She does have chronic back pain and tingling/pain in her extremities. Cervical spine MRI showed egenrative disease  ECOG PS- 1  Pain scale- 0   Review of systems- Review of Systems  Constitutional: Negative for chills, fever, malaise/fatigue and weight loss.  HENT: Negative for congestion, ear discharge and nosebleeds.   Eyes: Negative for blurred vision.  Respiratory: Negative for cough, hemoptysis, sputum production, shortness of breath and wheezing.   Cardiovascular: Negative for chest pain, palpitations, orthopnea and claudication.  Gastrointestinal: Negative for abdominal pain, blood in stool,  constipation, diarrhea, heartburn, melena, nausea and vomiting.  Genitourinary: Negative for dysuria, flank pain, frequency, hematuria and urgency.  Musculoskeletal: Positive for back pain. Negative for joint pain and myalgias.  Skin: Negative for rash.  Neurological: Negative for dizziness, tingling, focal weakness, seizures, weakness and headaches.  Endo/Heme/Allergies: Does not bruise/bleed easily.  Psychiatric/Behavioral: Negative for depression and suicidal ideas. The patient does not have insomnia.     Allergies  Allergen Reactions  . Codeine Nausea Only and Nausea And Vomiting  . Morphine Nausea Only  . Niacin Rash  . Niacin And Related Rash    ANTIHYPERLIPEMICS    Patient Active Problem List   Diagnosis Date Noted  . Radiculopathy 08/24/2017  . Fatty liver 08/24/2017  . Abdominal pain 08/24/2017  . Hearing difficulty of both ears 06/25/2017  . Chronic sinusitis 11/05/2016  . GERD (gastroesophageal reflux disease) 09/07/2016  . Cough 07/08/2016  . Exertional shortness of breath 01/15/2016  . OSA (obstructive sleep apnea) 11/01/2015  . Low back pain 01/01/2015  . Varicose vein of leg 11/22/2014  . Post herpetic neuralgia 03/06/2014  . Cystocele 10/25/2013  . Skin lesion 10/25/2013  . Obesity, unspecified 10/19/2012  . History of breast cancer 02/11/2012  . Hyperlipidemia 05/11/2011  . Osteopenia 05/11/2011  . Anxiety and depression 03/04/2011  . Hypertension 03/04/2011     Past Medical History:  Diagnosis Date  . Anxiety   . Arthritis   . Asthma   . Breast cancer [facility]) 2004   right breast  . Cancer [facility]) 2004   right lumpectomy, chemotherapy and radiation Dr. Jeb Levering  . Depression   . GERD (gastroesophageal  reflux disease)   . Hypertension   . Migraine   . Personal history of chemotherapy   . Personal history of radiation therapy   . PONV (postoperative nausea and vomiting)   . Shingles   . Sleep apnea    uses CPAP, severe OSA  . Vitamin D deficiency     IN THE PAST     Past Surgical History:  Procedure Laterality Date  . ABDOMINAL HYSTERECTOMY    . ANTERIOR AND POSTERIOR REPAIR    . BREAST BIOPSY Right    stereo prior to lumpectomy  . BREAST BIOPSY Left 09/07/2017   US guided biopsy  . BREAST EXCISIONAL BIOPSY Right 2004   chemo and rad  . BREAST LUMPECTOMY Right 2004  . ETHMOIDECTOMY Bilateral 11/05/2016   Procedure: ETHMOIDECTOMY;  Surgeon: Melida Quitter, MD;  Location: [place];  Service: ENT;  Laterality: Bilateral;  . MAXILLARY ANTROSTOMY Bilateral 11/05/2016   Procedure: MAXILLARY ANTROSTOMY;  Surgeon: Melida Quitter, MD;  Location: [place];  Service: ENT;  Laterality: Bilateral;  . SINUS ENDO W/FUSION Bilateral 11/05/2016   Procedure: FRONTAL RECESS EXPLORATION;  Surgeon: Melida Quitter, MD;  Location: [place];  Service: ENT;  Laterality: Bilateral;  BILATERAL ENDOSCOPIC SINUS SURGERY WITH FUSION  . SINUS EXPLORATION  11/05/2016  . SPHENOIDECTOMY Bilateral 11/05/2016   Procedure: SPHENOIDECTOMY;  Surgeon: Melida Quitter, MD;  Location: [facility] OR;  Service: ENT;  Laterality: Bilateral;    Social History   Socioeconomic History  . Marital status: Legally Separated    Spouse name: Not on file  . Number of children: Not on file  . Years of education: Not on file  . Highest education level: Not on file  Occupational History  . Not on file  Social Needs  . Financial resource strain: Not on file  . Food insecurity:    Worry: Not on file    Inability: Not on file  . Transportation needs:    Medical: Not on file    Non-medical: Not on file  Tobacco Use  . Smoking status: Never Smoker  . Smokeless tobacco: Never Used  Substance and Sexual Activity  . Alcohol use: Yes    Comment: rarely  . Drug use: No  . Sexual activity: Not on file  Lifestyle  . Physical activity:    Days per week: Not on file    Minutes per session: Not on file  . Stress: Not on file  Relationships  . Social connections:    Talks on phone: Not on file     Gets together: Not on file    Attends religious service: Not on file    Active member of club or organization: Not on file    Attends meetings of clubs or organizations: Not on file    Relationship status: Not on file  . Intimate partner violence:    Fear of current or ex partner: Not on file    Emotionally abused: Not on file    Physically abused: Not on file    Forced sexual activity: Not on file  Other Topics Concern  . Not on file  Social History Narrative   Daily Caffeine Use:  1-2 coffee   Regular Exercise -  NO     Family History  Problem Relation Age of Onset  . Cancer Sister        COLON  . Breast cancer Neg Hx      Current Outpatient Medications:  .  albuterol (PROVENTIL HFA;VENTOLIN HFA) 108 (90 Base) MCG/ACT  inhaler, Inhale 2 puffs into the lungs every 6 (six) hours as needed., Disp: 1 Inhaler, Rfl: 3 .  ALPRAZolam (XANAX) 0.5 MG tablet, Take 1 tablet (0.5 mg total) by mouth 2 (two) times daily as needed for anxiety., Disp: 90 tablet, Rfl: 1 .  Azelastine-Fluticasone 137-50 MCG/ACT SUSP, Place 1 spray into the nose 2 (two) times daily., Disp: 23 g, Rfl: 4 .  buPROPion (WELLBUTRIN XL) 150 MG 24 hr tablet, Take 1 tablet (150 mg total) by mouth daily., Disp: 90 tablet, Rfl: 1 .  celecoxib (CELEBREX) 100 MG capsule, Take 1 capsule (100 mg total) by mouth 2 (two) times daily., Disp: 180 capsule, Rfl: 1 .  cetirizine (ZYRTEC) 10 MG tablet, Take 1 tablet (10 mg total) by mouth daily., Disp: 90 tablet, Rfl: 1 .  docusate sodium (COLACE) 100 MG capsule, Take 1 tablet once or twice daily as needed for constipation while taking narcotic pain medicine, Disp: 30 capsule, Rfl: 0 .  FLUoxetine (PROZAC) 20 MG capsule, TAKE THREE CAPSULES BY MOUTH ONCE DAILY, Disp: 90 capsule, Rfl: 1 .  hydrochlorothiazide (HYDRODIURIL) 12.5 MG tablet, Take 1 tablet (12.5 mg total) by mouth daily., Disp: 90 tablet, Rfl: 3 .  lidocaine-prilocaine (EMLA) cream, Apply 1 application topically as needed.  Apply to left areola the morning of surgery, cover with Saran wrap., Disp: 5 g, Rfl: 0 .  Melatonin 10 MG TABS, Take 1 tablet by mouth at bedtime., Disp: , Rfl:  .  montelukast (SINGULAIR) 10 MG tablet, Take 1 tablet (10 mg total) by mouth at bedtime., Disp: 30 tablet, Rfl: 3 .  Multiple Vitamin (MULTIVITAMIN) capsule, Take 1 capsule by mouth daily., Disp: , Rfl:  .  pantoprazole (PROTONIX) 40 MG tablet, Take 1 tablet (40 mg total) by mouth daily., Disp: 90 tablet, Rfl: 2 .  rosuvastatin (CRESTOR) 20 MG tablet, Take 1 tablet (20 mg total) by mouth daily., Disp: 90 tablet, Rfl: 3 .  traMADol (ULTRAM) 50 MG tablet, Take 1 tablet (50 mg total) by mouth every 6 (six) hours as needed for moderate pain., Disp: 90 tablet, Rfl: 1 .  tretinoin (RETIN-A) 0.05 % cream, Apply 1 application topically at bedtime., Disp: 45 g, Rfl: 0   Physical exam:  Vitals:   09/14/17 1115  BP: 131/73  Pulse: 73  Resp: 18  Temp: 98.2 F (36.8 C)  TempSrc: Tympanic  SpO2: 97%  Weight: 205 lb (93 kg)  Height: _0  (1.626 m)   Physical Exam  Constitutional: She is oriented to person, place, and time. She appears well-developed and well-nourished.  HENT:  Head: Normocephalic and atraumatic.  Eyes: Pupils are equal, round, and reactive to light. EOM are normal.  Neck: Normal range of motion.  Cardiovascular: Normal rate, regular rhythm and normal heart sounds.  Pulmonary/Chest: Effort normal and breath sounds normal.  Abdominal: Soft. Bowel sounds are normal.  Neurological: She is alert and oriented to person, place, and time.  Skin: Skin is warm and dry.   Breast exam was performed in seated and lying down position. Patient is status post right lumpectomy with a well-healed surgical scar. No evidence of any palpable masses. No evidence of axillary adenopathy. No evidence of any palpable masses or lumps in the left breast. No evidence of leftt axillary adenopathy    CMP Latest Ref Rng & Units 09/13/2017    Glucose 65 - 99 mg/dL 104(H)  BUN 6 - 20 mg/dL 18  Creatinine 0.44 - 1.00 mg/dL 0.64  Sodium 135 - 145  mmol/L 139  Potassium 3.5 - 5.1 mmol/L 3.8  Chloride 101 - 111 mmol/L 103  CO2 22 - 32 mmol/L 27  Calcium 8.9 - 10.3 mg/dL 9.2  Total Protein 6.5 - 8.1 g/dL -  Total Bilirubin 0.3 - 1.2 mg/dL -  Alkaline Phos 38 - 126 U/L -  AST 15 - 41 U/L -  ALT 14 - 54 U/L -   CBC Latest Ref Rng & Units 09/13/2017  WBC 3.6 - 11.0 K/uL 8.4  Hemoglobin 12.0 - 16.0 g/dL 12.7  Hematocrit 35.0 - 47.0 % 37.0  Platelets 150 - 440 K/uL 246    No images are attached to the encounter.  Dg Cervical Spine Complete  Result Date: 08/25/2017 CLINICAL DATA:  Neck pain and bilateral arm tingling for 2 weeks. EXAM: CERVICAL SPINE - COMPLETE 4+ VIEW COMPARISON:  None. FINDINGS: Advanced degenerative cervical spondylosis with multilevel disc disease and facet disease. The disc disease is most severe at C5-6, C6-7 and C7-T1 and the facet disease is significant at all levels. There are also uncinate spurring changes with bilateral multilevel foraminal narrowing. No acute bony findings or abnormal prevertebral soft tissue swelling. The C1-2 articulations are maintained. Lung apices are grossly clear. IMPRESSION: Advanced degenerative cervical spondylosis with multilevel disc disease and facet disease. There are also uncinate spurring changes contributing to bilateral multilevel bony foraminal narrowing. No acute bony findings. Electronically Signed   By: Marijo Sanes M.D.   On: 08/25/2017 08:43   Mr Cervical Spine Wo Contrast  Result Date: 09/08/2017 CLINICAL DATA:  Tingling from both shoulders to the fingers and bilateral hand aching for 3-4 weeks. No known injury. EXAM: MRI CERVICAL SPINE WITHOUT CONTRAST TECHNIQUE: Multiplanar, multisequence MR imaging of the cervical spine was performed. No intravenous contrast was administered. COMPARISON:  Plain film cervical spine 08/24/2017. FINDINGS: Alignment: 0.2 cm facet  mediated anterolisthesis C4 on C5, C5 on C6 and C7 on T1 is identified. Vertebrae: No fracture or worrisome lesion. There is some degenerative endplate signal change at C5-6 and C6-7. Cord: Normal signal throughout. Posterior Fossa, vertebral arteries, paraspinal tissues: Negative. Disc levels: C2-3: Mild-to-moderate facet degenerative change is worse on the left. No bulge or protrusion. The central canal and foramina are open. C3-4: Left much worse than right facet arthropathy. There is also some uncovertebral spurring, more notable on the left. Severe left and mild-to-moderate right foraminal narrowing is seen. C4-5: Advanced bilateral facet degenerative change is worse on the left. The disc is uncovered without bulging. The central canal and right foramen are widely patent. Moderate left foraminal narrowing is mainly due to facet arthropathy. C5-6: Advanced bilateral facet degenerative change is seen. The disc is uncovered with a shallow bulge and there is some uncovertebral disease. The central canal remains open. Moderately severe to severe bilateral foraminal narrowing is seen. C6-7: Broad-based disc bulge and uncovertebral spurring are seen. There is flattening of the ventral cord. Moderately severe to severe foraminal narrowing is worse on the right. C7-T1: Advanced bilateral facet degenerative change is worse on the right. The disc is uncovered without bulging. The central canal is open. Moderately severe to severe bilateral foraminal narrowing appears worse on the right. 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 Uncovertebral spurring and facet arthropathy cause moderately severe to severe bilateral foraminal narrowing, worse on the right. Severe left foraminal narrowing at C3-4 due to uncovertebral disease and advanced facet degenerative change. Moderate left foraminal narrowing at C4-5 is predominantly due to facet degeneration. Moderately severe to severe  bilateral foraminal narrowing at C5-6  is due to advanced facet arthropathy and uncovertebral spurring. Moderately severe to severe bilateral foraminal narrowing at C7-T1 is main the due to facet arthropathy. Electronically Signed   By: Inge Rise M.D.   On: 09/08/2017 11:27   US Breast Complete Uni Left Inc Axilla  Result Date: 09/10/2017 Ultrasound examination of the left breast was undertaken to determine if preoperative wire localization would be required.  There is a well-defined hypoechoic mass in the 2 o'clock position of the left breast 5 cm from the nipple approximately 1.3 cm below the skin level with a corresponding biopsy clip within the lesion.  BI-RADS-6.   US Breast Ltd Uni Left Inc Axilla  Result Date: 08/31/2017 CLINICAL DATA:  Left breast upper outer quadrant nodule seen most recent screening mammography. EXAM: DIGITAL DIAGNOSTIC LEFT MAMMOGRAM WITH CAD AND TOMO ULTRASOUND LEFT BREAST COMPARISON:  Previous exam(s). ACR Breast Density Category b: There are scattered areas of fibroglandular density. FINDINGS: Additional mammographic views of the left breast demonstrate a spiculated 1 cm mass in the left breast upper outer quadrant, posterior depth. Mammographic images were processed with CAD. On physical exam, no suspicious masses are palpated. Targeted ultrasound is performed, showing left breast 2 o'clock 5 cm from the nipple hypoechoic irregular mass measuring 0.7 x 0.5 x 0.7 cm. There is no evidence of left axillary lymphadenopathy. IMPRESSION: Left breast 2 o'clock suspicious mass, for which ultrasound-guided core needle biopsy is recommended. No evidence of left axillary. RECOMMENDATION: Ultrasound-guided core needle biopsy of the left breast. I have discussed the findings and recommendations with the patient. Results were also provided in writing at the conclusion of the visit. If applicable, a reminder letter will be sent to the patient regarding the next appointment. BI-RADS CATEGORY  4: Suspicious. Electronically  Signed   By: Fidela Salisbury M.D.   On: 08/31/2017 15:54   Mm Diag Breast Tomo Uni Left  Result Date: 08/31/2017 CLINICAL DATA:  Left breast upper outer quadrant nodule seen most recent screening mammography. EXAM: DIGITAL DIAGNOSTIC LEFT MAMMOGRAM WITH CAD AND TOMO ULTRASOUND LEFT BREAST COMPARISON:  Previous exam(s). ACR Breast Density Category b: There are scattered areas of fibroglandular density. FINDINGS: Additional mammographic views of the left breast demonstrate a spiculated 1 cm mass in the left breast upper outer quadrant, posterior depth. Mammographic images were processed with CAD. On physical exam, no suspicious masses are palpated. Targeted ultrasound is performed, showing left breast 2 o'clock 5 cm from the nipple hypoechoic irregular mass measuring 0.7 x 0.5 x 0.7 cm. There is no evidence of left axillary lymphadenopathy. IMPRESSION: Left breast 2 o'clock suspicious mass, for which ultrasound-guided core needle biopsy is recommended. No evidence of left axillary. RECOMMENDATION: Ultrasound-guided core needle biopsy of the left breast. I have discussed the findings and recommendations with the patient. Results were also provided in writing at the conclusion of the visit. If applicable, a reminder letter will be sent to the patient regarding the next appointment. BI-RADS CATEGORY  4: Suspicious. Electronically Signed   By: Fidela Salisbury M.D.   On: 08/31/2017 15:54   Mm Screening Breast Tomo Bilateral  Result Date: 08/25/2017 CLINICAL DATA:  Screening. RIGHT lumpectomy in 2004. EXAM: DIGITAL SCREENING BILATERAL MAMMOGRAM WITH TOMO AND CAD COMPARISON:  Previous exam(s). ACR Breast Density Category b: There are scattered areas of fibroglandular density. FINDINGS: In the left breast, a possible mass warrants further evaluation. In the right breast, no findings suspicious for malignancy. Images were processed with CAD.  IMPRESSION: Further evaluation is suggested for possible mass in the  left breast. RECOMMENDATION: Diagnostic mammogram and possibly ultrasound of the left breast. (Code:[id number]-[id number]) The patient will be contacted regarding the findings, and additional imaging will be scheduled. BI-RADS CATEGORY  0: Incomplete. Need additional imaging evaluation and/or prior mammograms for comparison. Electronically Signed   By: Nolon Nations M.D.   On: 08/25/2017 15:10   Mm Clip Placement Left  Addendum Date: 09/08/2017   ADDENDUM REPORT: 09/08/2017 16:18 ADDENDUM: PATHOLOGY ADDENDUM: Pathology: A. BREAST, LEFT, 2 O'CLOCK 5 CM FROM NIPPLE; ULTRASOUND-GUIDED CORE BIOPSY: - INVASIVE MAMMARY CARCINOMA, NO SPECIAL TYPE. Size of invasive carcinoma: 11 mm in this sample Histologic grade of invasive carcinoma: Grade 1 Ductal carcinoma in situ: Not identified Lymphovascular invasion: Not identified Pathology concordance with imaging findings: Yes Recommendation: Consultation for treatment plan At the request of the patient, I spoke with her by telephone on 09/08/2017 at 16:18. She reports doing well after the biopsy . Electronically Signed   By: Nolon Nations M.D.   On: 09/08/2017 16:18   Result Date: 09/08/2017 CLINICAL DATA:  Post ultrasound-guided core needle biopsy of the left breast. EXAM: DIAGNOSTIC LEFT MAMMOGRAM POST ULTRASOUND BIOPSY COMPARISON:  Previous exam(s). FINDINGS: Mammographic images were obtained following the ultrasound guided biopsy of left breast. A two-view mammography demonstrates presence of wing shaped marker within the mass of question in the left 2 o'clock breast. Expected post biopsy changes. IMPRESSION: Successful placement of wing shaped marker post ultrasound-guided core needle biopsy of left breast 2 o'clock mass. Final Assessment: Post Procedure Mammograms for Marker Placement Electronically Signed: By: Fidela Salisbury M.D. On: 09/07/2017 09:58   [place] Lt Breast Bx W Loc Dev 1st Lesion Img Bx Spec US Guide  Result Date: 09/07/2017 CLINICAL DATA:  Left breast 2  o'clock mass. EXAM: ULTRASOUND GUIDED LEFT BREAST CORE NEEDLE BIOPSY COMPARISON:  Previous exam(s). FINDINGS: I met with the patient and we discussed the procedure of ultrasound-guided biopsy, including benefits and alternatives. We discussed the high likelihood of a successful procedure. We discussed the risks of the procedure, including infection, bleeding, tissue injury, clip migration, and inadequate sampling. Informed written consent was given. The usual time-out protocol was performed immediately prior to the procedure. Lesion quadrant: Upper outer quadrant Using sterile technique and 1% Lidocaine as local anesthetic, under direct ultrasound visualization, a 14 gauge spring-loaded device was used to perform biopsy of the left breast 2 o'clock mass using a inferior approach. At the conclusion of the procedure a wing shaped tissue marker clip was deployed into the biopsy cavity. Follow up 2 view mammogram was performed and dictated separately. IMPRESSION: Ultrasound guided biopsy of left breast.  No apparent complications. Electronically Signed   By: Fidela Salisbury M.D.   On: 09/07/2017 09:52    Assessment and plan- Patient is a 72 y.o. 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 Core biopsy showed IDC of grade1 IDC measuring 11 mm. US showed no axillary adenopathy. She is scheduled to undergo lumpectomy and SLNB later this week.  Given prior h/o breast cancer and 11 mm tumor noted on core biopsy, I am inclined to do oncotype testing on core biopsy or final path specimen depending on grade and final size. Given that tumor was grade 1, I do not anticipate that patient will need adjuvant chemotherapy. I discussed  what oncotype testing entails and how results are interpreted. Since she is > 51 years of age; per TAILORx trial- if her oncotype  recurrence score is < 25- she would not require adjuvant chemotherapy. She will need adjuvant chemotherapy if her score is >/ = 26. Patient verbalized understanding.   I will see her tentatively in  3 weeks time after oncotype testing results are back  Given that her tumor was ER PR positive hormone therapy is indicated at this time.  I discussed the role for hormone therapy. Given that she is postmenopausal and this is a second ER positive breast cancer, I would favor atleast 10 years of adjuvant hormone therapy with aromatase inhibitor. I will discuss this in greater detail at next visit  Patient would also need adjuvant radiation therapy post surgery. Treatment will be given with a curative intent. I will try to obtain records of prior breast cancer from 2004.  I will also refer her for genetic counseling given that she has had 2 breast cancers in her lifetime  I also discussed our exact sciences research study that we have open and she will discuss this with our research team today   Cancer Staging Malignant neoplasm of upper-outer quadrant of left breast in female, estrogen receptor positive ([place]) Staging form: Breast, AJCC 8th Edition - Clinical stage from 09/14/2017: Stage IA (cT1c, cN0, cM0, G1, ER+, PR+, HER2-) - Signed by Sindy Guadeloupe, MD on 09/14/2017     Thank you for this kind referral and the opportunity to participate in the care of this patient   Visit Diagnosis 1. Malignant neoplasm of upper-outer quadrant of left breast in female, estrogen receptor positive ([place])   2. Goals of care, counseling/discussion     Dr. Randa Evens, MD, MPH [facility] [phone] 09/14/2017  12:06 PM

## 2017-09-15 ENCOUNTER — Encounter: Payer: Self-pay | Admitting: *Deleted

## 2017-09-17 ENCOUNTER — Ambulatory Visit
Admission: RE | Admit: 2017-09-17 | Discharge: 2017-09-17 | Disposition: A | Discharge status: Home / Self Care | Payer: PPO | Source: Ambulatory Visit | Attending: General Surgery | Admitting: General Surgery

## 2017-09-17 ENCOUNTER — Ambulatory Visit: Payer: PPO

## 2017-09-17 ENCOUNTER — Encounter
Admission: RE | Disposition: A | Discharge status: Home / Self Care | Payer: Self-pay | Source: Ambulatory Visit | Attending: General Surgery

## 2017-09-17 ENCOUNTER — Ambulatory Visit: Payer: PPO | Admitting: Anesthesiology

## 2017-09-17 DIAGNOSIS — G4730 Sleep apnea, unspecified: Secondary | ICD-10-CM | POA: Insufficient documentation

## 2017-09-17 DIAGNOSIS — C50412 Malignant neoplasm of upper-outer quadrant of left female breast: Secondary | ICD-10-CM | POA: Diagnosis not present

## 2017-09-17 DIAGNOSIS — I10 Essential (primary) hypertension: Secondary | ICD-10-CM | POA: Diagnosis not present

## 2017-09-17 DIAGNOSIS — F329 Major depressive disorder, single episode, unspecified: Secondary | ICD-10-CM | POA: Diagnosis not present

## 2017-09-17 DIAGNOSIS — J45909 Unspecified asthma, uncomplicated: Secondary | ICD-10-CM | POA: Diagnosis not present

## 2017-09-17 DIAGNOSIS — F419 Anxiety disorder, unspecified: Secondary | ICD-10-CM | POA: Insufficient documentation

## 2017-09-17 DIAGNOSIS — K219 Gastro-esophageal reflux disease without esophagitis: Secondary | ICD-10-CM | POA: Insufficient documentation

## 2017-09-17 DIAGNOSIS — Z79899 Other long term (current) drug therapy: Secondary | ICD-10-CM | POA: Insufficient documentation

## 2017-09-17 DIAGNOSIS — F418 Other specified anxiety disorders: Secondary | ICD-10-CM | POA: Diagnosis not present

## 2017-09-17 DIAGNOSIS — E559 Vitamin D deficiency, unspecified: Secondary | ICD-10-CM | POA: Diagnosis not present

## 2017-09-17 DIAGNOSIS — G4733 Obstructive sleep apnea (adult) (pediatric): Secondary | ICD-10-CM | POA: Diagnosis not present

## 2017-09-17 DIAGNOSIS — N6320 Unspecified lump in the left breast, unspecified quadrant: Secondary | ICD-10-CM

## 2017-09-17 DIAGNOSIS — C50912 Malignant neoplasm of unspecified site of left female breast: Secondary | ICD-10-CM | POA: Diagnosis not present

## 2017-09-17 DIAGNOSIS — C50411 Malignant neoplasm of upper-outer quadrant of right female breast: Secondary | ICD-10-CM | POA: Diagnosis not present

## 2017-09-17 HISTORY — DX: Malignant neoplasm of upper-outer quadrant of left female breast: C50.412

## 2017-09-17 HISTORY — PX: BREAST LUMPECTOMY: SHX2

## 2017-09-17 SURGERY — BREAST LUMPECTOMY WITH EXCISION OF SENTINEL NODE
Anesthesia: General | Laterality: Left | Wound class: Clean

## 2017-09-17 MED ORDER — BUPIVACAINE-EPINEPHRINE (PF) 0.5% -1:200000 IJ SOLN
INTRAMUSCULAR | Status: DC | PRN
  Administered 2017-09-17: 10 mL via PERINEURAL
  Administered 2017-09-17: 20 mL via PERINEURAL

## 2017-09-17 MED ORDER — OXYCODONE HCL 5 MG/5ML PO SOLN: 5.0000 mg | Freq: Once | ORAL | Status: DC | PRN

## 2017-09-17 MED ORDER — PROMETHAZINE HCL 25 MG/ML IJ SOLN
6.2500 mg | INTRAMUSCULAR | Status: DC | PRN
  Administered 2017-09-17: 6.5 mg via INTRAVENOUS

## 2017-09-17 MED ORDER — FENTANYL CITRATE (PF) 100 MCG/2ML IJ SOLN
INTRAMUSCULAR | Status: AC
  Administered 2017-09-17: 25 ug via INTRAVENOUS
  Filled 2017-09-17: qty 2

## 2017-09-17 MED ORDER — METHYLENE BLUE 1 % INJ SOLN
INTRAMUSCULAR | Status: AC
  Filled 2017-09-17: qty 10

## 2017-09-17 MED ORDER — OXYCODONE HCL 5 MG PO TABS: 5.0000 mg | ORAL_TABLET | Freq: Once | ORAL | Status: DC | PRN

## 2017-09-17 MED ORDER — ONDANSETRON HCL 4 MG/2ML IJ SOLN
INTRAMUSCULAR | Status: DC | PRN
  Administered 2017-09-17: 4 mg via INTRAVENOUS

## 2017-09-17 MED ORDER — SODIUM CHLORIDE FLUSH 0.9 % IV SOLN
INTRAVENOUS | Status: AC
  Filled 2017-09-17: qty 10

## 2017-09-17 MED ORDER — PROPOFOL 10 MG/ML IV BOLUS
INTRAVENOUS | Status: AC
  Filled 2017-09-17: qty 20

## 2017-09-17 MED ORDER — FENTANYL CITRATE (PF) 100 MCG/2ML IJ SOLN
INTRAMUSCULAR | Status: DC | PRN
  Administered 2017-09-17 (×2): 50 ug via INTRAVENOUS

## 2017-09-17 MED ORDER — HYDROMORPHONE HCL 1 MG/ML IJ SOLN
INTRAMUSCULAR | Status: DC | PRN
  Administered 2017-09-17: 0.5 mg via INTRAVENOUS

## 2017-09-17 MED ORDER — PROPOFOL 10 MG/ML IV BOLUS
INTRAVENOUS | Status: DC | PRN
  Administered 2017-09-17: 150 mg via INTRAVENOUS

## 2017-09-17 MED ORDER — SCOPOLAMINE 1 MG/3DAYS TD PT72
1.0000 | MEDICATED_PATCH | TRANSDERMAL | Status: DC
  Administered 2017-09-17: 1.5 mg via TRANSDERMAL

## 2017-09-17 MED ORDER — BUPIVACAINE-EPINEPHRINE (PF) 0.5% -1:200000 IJ SOLN
INTRAMUSCULAR | Status: AC
  Filled 2017-09-17: qty 30

## 2017-09-17 MED ORDER — LIDOCAINE HCL (PF) 2 % IJ SOLN
INTRAMUSCULAR | Status: AC
  Filled 2017-09-17: qty 10

## 2017-09-17 MED ORDER — PROMETHAZINE HCL 25 MG/ML IJ SOLN
INTRAMUSCULAR | Status: AC
  Administered 2017-09-17: 6.5 mg via INTRAVENOUS
  Filled 2017-09-17: qty 1

## 2017-09-17 MED ORDER — ACETAMINOPHEN 10 MG/ML IV SOLN
INTRAVENOUS | Status: DC | PRN
  Administered 2017-09-17: 1000 mg via INTRAVENOUS

## 2017-09-17 MED ORDER — METHYLENE BLUE 0.5 % INJ SOLN
INTRAVENOUS | Status: DC | PRN
  Administered 2017-09-17: 5 mL via SUBMUCOSAL

## 2017-09-17 MED ORDER — LACTATED RINGERS IV SOLN
INTRAVENOUS | Status: DC | PRN
  Administered 2017-09-17: 10:00:00 via INTRAVENOUS

## 2017-09-17 MED ORDER — HYDROMORPHONE HCL 1 MG/ML IJ SOLN
INTRAMUSCULAR | Status: AC
  Filled 2017-09-17: qty 1

## 2017-09-17 MED ORDER — ONDANSETRON HCL 4 MG/2ML IJ SOLN
INTRAMUSCULAR | Status: AC
  Filled 2017-09-17: qty 2

## 2017-09-17 MED ORDER — TECHNETIUM TC 99M SULFUR COLLOID FILTERED
1.0900 | Freq: Once | INTRAVENOUS | Status: AC | PRN
  Administered 2017-09-17: 1.09 via INTRADERMAL

## 2017-09-17 MED ORDER — GLYCOPYRROLATE 0.2 MG/ML IJ SOLN
INTRAMUSCULAR | Status: DC | PRN
  Administered 2017-09-17: 0.2 mg via INTRAVENOUS

## 2017-09-17 MED ORDER — EPHEDRINE SULFATE 50 MG/ML IJ SOLN
INTRAMUSCULAR | Status: DC | PRN
  Administered 2017-09-17: 10 mg via INTRAVENOUS

## 2017-09-17 MED ORDER — LACTATED RINGERS IV SOLN
INTRAVENOUS | Status: DC
  Administered 2017-09-17: 10:00:00 via INTRAVENOUS

## 2017-09-17 MED ORDER — HYDROCODONE-ACETAMINOPHEN 5-325 MG PO TABS
1.0000 | ORAL_TABLET | ORAL | 0 refills | Status: DC | PRN
Start: 2017-09-17 — End: 2017-09-23

## 2017-09-17 MED ORDER — FENTANYL CITRATE (PF) 100 MCG/2ML IJ SOLN
25.0000 ug | INTRAMUSCULAR | Status: DC | PRN
  Administered 2017-09-17 (×4): 25 ug via INTRAVENOUS

## 2017-09-17 MED ORDER — FENTANYL CITRATE (PF) 100 MCG/2ML IJ SOLN
INTRAMUSCULAR | Status: AC
  Filled 2017-09-17: qty 2

## 2017-09-17 MED ORDER — PROMETHAZINE HCL 12.5 MG PO TABS: 12.5000 mg | ORAL_TABLET | Freq: Four times a day (QID) | ORAL | 0 refills | Status: DC | PRN

## 2017-09-17 MED ORDER — DEXAMETHASONE SODIUM PHOSPHATE 10 MG/ML IJ SOLN
INTRAMUSCULAR | Status: DC | PRN
  Administered 2017-09-17: 5 mg via INTRAVENOUS

## 2017-09-17 MED ORDER — SCOPOLAMINE 1 MG/3DAYS TD PT72
MEDICATED_PATCH | TRANSDERMAL | Status: AC
  Filled 2017-09-17: qty 1

## 2017-09-17 SURGICAL SUPPLY — 46 items
BINDER BREAST XLRG (GAUZE/BANDAGES/DRESSINGS) ×2 IMPLANT
BLADE SURG 15 STRL SS SAFETY (BLADE) ×4 IMPLANT
CANISTER SUCT 1200ML W/VALVE (MISCELLANEOUS) ×2 IMPLANT
CHLORAPREP W/TINT 26ML (MISCELLANEOUS) ×2 IMPLANT
CNTNR SPEC 2.5X3XGRAD LEK (MISCELLANEOUS) ×1
CONT SPEC 4OZ STER OR WHT (MISCELLANEOUS) ×1
CONTAINER SPEC 2.5X3XGRAD LEK (MISCELLANEOUS) ×1 IMPLANT
COVER PROBE FLX POLY STRL (MISCELLANEOUS) ×2 IMPLANT
DEVICE DUBIN SPECIMEN MAMMOGRA (MISCELLANEOUS) ×2 IMPLANT
DRAPE LAPAROTOMY TRNSV 106X77 (MISCELLANEOUS) ×2 IMPLANT
DRSG GAUZE FLUFF 36X18 (GAUZE/BANDAGES/DRESSINGS) ×4 IMPLANT
DRSG TELFA 3X8 NADH (GAUZE/BANDAGES/DRESSINGS) ×2 IMPLANT
ELECT CAUTERY BLADE TIP 2.5 (TIP) ×2
ELECT REM PT RETURN 9FT ADLT (ELECTROSURGICAL) ×2
ELECTRODE CAUTERY BLDE TIP 2.5 (TIP) ×1 IMPLANT
ELECTRODE REM PT RTRN 9FT ADLT (ELECTROSURGICAL) ×1 IMPLANT
GAUZE SPONGE 4X4 12PLY STRL (GAUZE/BANDAGES/DRESSINGS) ×2 IMPLANT
GLOVE BIO SURGEON STRL SZ7.5 (GLOVE) ×2 IMPLANT
GLOVE INDICATOR 8.0 STRL GRN (GLOVE) ×2 IMPLANT
GOWN STRL REUS W/ TWL LRG LVL3 (GOWN DISPOSABLE) ×2 IMPLANT
GOWN STRL REUS W/TWL LRG LVL3 (GOWN DISPOSABLE) ×2
KIT TURNOVER KIT A (KITS) ×2 IMPLANT
MARGIN MAP 10MM (MISCELLANEOUS) ×2 IMPLANT
NEEDLE HYPO 22GX1.5 SAFETY (NEEDLE) ×2 IMPLANT
NEEDLE HYPO 25X1 1.5 SAFETY (NEEDLE) ×4 IMPLANT
PACK BASIN MINOR ARMC (MISCELLANEOUS) ×2 IMPLANT
RETRACTOR RING XSMALL (MISCELLANEOUS) ×1 IMPLANT
RTRCTR WOUND ALEXIS 13CM XS SH (MISCELLANEOUS) ×2
SHEARS HARMONIC 9CM CVD (BLADE) ×2 IMPLANT
SLEVE PROBE SENORX GAMMA FIND (MISCELLANEOUS) ×2 IMPLANT
STRIP CLOSURE SKIN 1/2X4 (GAUZE/BANDAGES/DRESSINGS) ×2 IMPLANT
SUT ETHILON 3-0 FS-10 30 BLK (SUTURE) ×2
SUT SILK 2 0 (SUTURE) ×1
SUT SILK 2-0 18XBRD TIE 12 (SUTURE) ×1 IMPLANT
SUT VIC AB 2-0 CT1 27 (SUTURE) ×3
SUT VIC AB 2-0 CT1 TAPERPNT 27 (SUTURE) ×3 IMPLANT
SUT VIC AB 3-0 SH 27 (SUTURE) ×2
SUT VIC AB 3-0 SH 27X BRD (SUTURE) ×2 IMPLANT
SUT VIC AB 4-0 FS2 27 (SUTURE) ×4 IMPLANT
SUT VICRYL+ 3-0 144IN (SUTURE) ×2 IMPLANT
SUTURE EHLN 3-0 FS-10 30 BLK (SUTURE) ×1 IMPLANT
SWABSTK COMLB BENZOIN TINCTURE (MISCELLANEOUS) ×2 IMPLANT
SYR 10ML LL (SYRINGE) ×2 IMPLANT
SYR BULB IRRIG 60ML STRL (SYRINGE) ×2 IMPLANT
TAPE TRANSPORE STRL 2 31045 (GAUZE/BANDAGES/DRESSINGS) ×2 IMPLANT
WATER STERILE IRR 1000ML POUR (IV SOLUTION) ×2 IMPLANT

## 2017-09-17 NOTE — Discharge Instructions (Signed)

## 2017-09-17 NOTE — Progress Notes (Signed)
Pt noted to drop sats to 88% when asleep.  Easy to arouse to take deep breaths and admits to sleep apnea and uses a CPAP machine at home.

## 2017-09-17 NOTE — H&P (Signed)
72 year old female for excision of a left breast cancer.  Tolerated sentinel node injection well.  No change in clinical history or exam.

## 2017-09-17 NOTE — Anesthesia Post-op Follow-up Note (Signed)
Anesthesia QCDR form completed.        

## 2017-09-17 NOTE — Anesthesia Procedure Notes (Addendum)
Procedure Name: LMA Insertion Date/Time: 09/17/2017 10:03 AM Performed by: Justus Memory, CRNA Pre-anesthesia Checklist: Patient identified, Patient being monitored, Timeout performed, Emergency Drugs available and Suction available Patient Re-evaluated:Patient Re-evaluated prior to induction Oxygen Delivery Method: Circle system utilized Preoxygenation: Pre-oxygenation with 100% oxygen Induction Type: IV induction Ventilation: Mask ventilation without difficulty LMA: LMA inserted LMA Size: 3.5 Tube type: Oral Number of attempts: 1 Placement Confirmation: positive ETCO2 and breath sounds checked- equal and bilateral Tube secured with: Tape Dental Injury: Teeth and Oropharynx as per pre-operative assessment

## 2017-09-17 NOTE — Transfer of Care (Signed)
Immediate Anesthesia Transfer of Care Note  Patient: Dana Obrien  Procedure(s) Performed: BREAST EXCISION, SENTINEL NODE BIOPSY (Left )  Patient Location: PACU  Anesthesia Type:General  Level of Consciousness: sedated  Airway & Oxygen Therapy: Patient Spontanous Breathing and Patient connected to face mask oxygen  Post-op Assessment: Report given to RN and Post -op Vital signs reviewed and stable  Post vital signs: Reviewed and stable   Last Vitals:  Vitals Value Taken Time  BP 141/77 09/17/2017 11:45 AM  Temp    Pulse 93 09/17/2017 11:56 AM  Resp 16 09/17/2017 11:56 AM  SpO2 96 % 09/17/2017 11:56 AM  Vitals shown include unvalidated device data.  Last Pain:  Vitals:   09/17/17 0920  TempSrc: Oral  PainSc: 6          Complications: No apparent anesthesia complications

## 2017-09-17 NOTE — Anesthesia Postprocedure Evaluation (Signed)
Anesthesia Post Note  Patient: BETSI CRESPI  Procedure(s) Performed: BREAST EXCISION, SENTINEL NODE BIOPSY (Left )  Patient location during evaluation: PACU Anesthesia Type: General Level of consciousness: awake and alert Pain management: pain level controlled Vital Signs Assessment: post-procedure vital signs reviewed and stable Respiratory status: spontaneous breathing, nonlabored ventilation, respiratory function stable and patient connected to nasal cannula oxygen Cardiovascular status: blood pressure returned to baseline and stable Postop Assessment: no apparent nausea or vomiting Anesthetic complications: no     Last Vitals:  Vitals:   09/17/17 1230 09/17/17 1245  BP: (!) 127/96 137/66  Pulse: 91 87  Resp: 20 18  Temp:  36.7 C  SpO2: 97% 94%    Last Pain:  Vitals:   09/17/17 1245  TempSrc: Temporal  PainSc: 4                  Precious Haws Piscitello

## 2017-09-17 NOTE — Anesthesia Preprocedure Evaluation (Addendum)
Anesthesia Evaluation  Patient identified by MRN, date of birth, ID band Patient awake    Reviewed: Allergy & Precautions, H&P , NPO status , Patient's Chart, lab work & pertinent test results  History of Anesthesia Complications (+) PONV and history of anesthetic complications  Airway Mallampati: III  TM Distance: <3 FB Neck ROM: limited    Dental  (+) Chipped, Poor Dentition   Pulmonary neg shortness of breath, asthma , sleep apnea ,           Cardiovascular Exercise Tolerance: Good hypertension, (-) angina+ Peripheral Vascular Disease  (-) Past MI      Neuro/Psych  Headaches, PSYCHIATRIC DISORDERS Anxiety Depression  Neuromuscular disease    GI/Hepatic Neg liver ROS, GERD  Medicated and Controlled,  Endo/Other  negative endocrine ROS  Renal/GU      Musculoskeletal  (+) Arthritis ,   Abdominal   Peds  Hematology negative hematology ROS (+)   Anesthesia Other Findings Past Medical History: No date: Anxiety No date: Arthritis No date: Asthma 2004: Breast cancer [facility])     Comment:  right breast 2004: Cancer ([place])     Comment:  right lumpectomy, chemotherapy and radiation Dr. Jeb Levering No date: Depression No date: GERD (gastroesophageal reflux disease) No date: Hypertension No date: Migraine No date: Personal history of chemotherapy No date: Personal history of radiation therapy No date: PONV (postoperative nausea and vomiting) No date: Shingles No date: Sleep apnea     Comment:  uses CPAP, severe OSA No date: Vitamin D deficiency     Comment:  IN THE PAST  Past Surgical History: No date: ABDOMINAL HYSTERECTOMY No date: ANTERIOR AND POSTERIOR REPAIR No date: BREAST BIOPSY; Right     Comment:  stereo prior to lumpectomy 09/07/2017: BREAST BIOPSY; Left     Comment:  US guided biopsy 2004: BREAST EXCISIONAL BIOPSY; Right     Comment:  chemo and rad 2004: BREAST LUMPECTOMY; Right 11/05/2016:  ETHMOIDECTOMY; Bilateral     Comment:  Procedure: ETHMOIDECTOMY;  Surgeon: Melida Quitter, MD;                Location: [place];  Service: ENT;  Laterality: Bilateral; 11/05/2016: MAXILLARY ANTROSTOMY; Bilateral     Comment:  Procedure: MAXILLARY ANTROSTOMY;  Surgeon: Melida Quitter, MD;  Location: [place];  Service: ENT;  Laterality:              Bilateral; 11/05/2016: SINUS ENDO W/FUSION; Bilateral     Comment:  Procedure: FRONTAL RECESS EXPLORATION;  Surgeon: Melida Quitter, MD;  Location: [place];  Service: ENT;  Laterality:              Bilateral;  BILATERAL ENDOSCOPIC SINUS SURGERY WITH               FUSION 11/05/2016: SINUS EXPLORATION 11/05/2016: SPHENOIDECTOMY; Bilateral     Comment:  Procedure: SPHENOIDECTOMY;  Surgeon: Melida Quitter, MD;               Location: [facility] OR;  Service: ENT;  Laterality: Bilateral;     Reproductive/Obstetrics negative OB ROS                             Anesthesia Physical Anesthesia Plan  ASA: III  Anesthesia Plan: General LMA   Post-op Pain Management:  Induction: Intravenous  PONV Risk Score and Plan: 4 or greater and Ondansetron, Dexamethasone, Midazolam and Scopolamine patch - Pre-op  Airway Management Planned: LMA  Additional Equipment:   Intra-op Plan:   Post-operative Plan: Extubation in OR  Informed Consent: I have reviewed the patients History and Physical, chart, labs and discussed the procedure including the risks, benefits and alternatives for the proposed anesthesia with the patient or authorized representative who has indicated his/her understanding and acceptance.   Dental Advisory Given  Plan Discussed with: Anesthesiologist, CRNA and Surgeon  Anesthesia Plan Comments: (Patient consented for risks of anesthesia including but not limited to:  - adverse reactions to medications - damage to teeth, lips or other oral mucosa - sore throat or hoarseness - Damage to heart, brain,  lungs or loss of life  Patient voiced understanding.)        Anesthesia Quick Evaluation

## 2017-09-17 NOTE — OR Nursing (Signed)
Dr. Bary Castilla in to see pt @ 1254 pm.

## 2017-09-17 NOTE — Op Note (Signed)
Preoperative diagnosis: Left breast cancer.  Postoperative diagnosis: Same.  Operative procedure: Left breast wide excision with ultrasound guidance, sentinel lymph node biopsy.  Operating Surgeon: Hervey Ard, MD.  Anesthesia: General by LMA, Marcaine 0.5% with 1 to 200,000 units of epinephrine, 30 cc.  Estimated blood loss: 5 cc.  Clinical note: This 72 year old woman recently had a mammogram showing a new finding in the upper outer quadrant.  Core biopsy showed evidence of invasive mammary carcinoma.  She desired breast conservation.  She was injected with technetium sulfur colloid prior to presentation of the operating theater.  Operative note: The patient had SCD stockings for DVT prevention.  Preoperative antibiotics were not appropriate.  The breast was cleansed with alcohol and 5 cc of 0.5% methylene blue was instilled in the subareolar plexus.  ChloraPrep was then used for skin cleansing.  Ultrasound was used to confirm the previous biopsy site and tumor mass in the upper outer quadrant of the left breast.  Local anesthesia was infiltrated.  A curvilinear incision in the upper outer quadrant was made carried down through skin subtenons tissue.  An Alexis wound protector was placed and a 4 x 4 x 4 cm block of tissue was orientated and sent for specimen radiograph confirming the previous biopsy site and clip within the center of the specimen.  While the breast specimen was being examined by pathology attention was turned to the axilla.  Fairly low counts of just about 120 were detected.  It was possible to approach this area through the wide excision site.  The axillary envelope was opened and a single hot blue lymph node was identified and sentinel sentinel node #1.  A small hot but not blue area was sent as sentinel node #2.  Some indeterminate fatty tissue was sent in formalin for routine histology.  Pathology report of the margins were grossly clear.  The deep adipose layer was closed  with interrupted 2-0 Vicryl figure-of-eight sutures.  The superficial adipose tissue was treated in a similar fashion.  The skin was closed with a running 4-0 Vicryl subcuticular suture.  Benzoin, Steri-Strips, Telfa and Tegaderm dressings were applied.  The patient tolerated the procedure well was taken to recovery room in stable condition.

## 2017-09-20 ENCOUNTER — Other Ambulatory Visit: Payer: Self-pay | Admitting: Family Medicine

## 2017-09-20 ENCOUNTER — Telehealth: Payer: Self-pay | Admitting: Genetic Counselor

## 2017-09-20 NOTE — Telephone Encounter (Signed)
Last OV 08/24/17 last filled 01/01/17 90 1rf

## 2017-09-20 NOTE — Telephone Encounter (Signed)
Dr. Janese Banks is referring Ms. Dana Obrien for genetic counseling due to a personal history of bilateral breast cancers and family history of cancer. I left her today and she opted to schedule this telegenetics visit on 09/21/17 at 10:45am.   Steele Berg, [place], [place] Genetic Counselor Phone: [phone]

## 2017-09-21 ENCOUNTER — Encounter: Payer: Self-pay | Admitting: Genetic Counselor

## 2017-09-21 ENCOUNTER — Telehealth: Payer: Self-pay | Admitting: General Surgery

## 2017-09-21 ENCOUNTER — Other Ambulatory Visit: Payer: Self-pay | Admitting: Genetic Counselor

## 2017-09-21 ENCOUNTER — Telehealth: Payer: Self-pay | Admitting: Genetic Counselor

## 2017-09-21 DIAGNOSIS — Z170 Estrogen receptor positive status [ER+]: Principal | ICD-10-CM

## 2017-09-21 DIAGNOSIS — C50412 Malignant neoplasm of upper-outer quadrant of left female breast: Secondary | ICD-10-CM

## 2017-09-21 LAB — SURGICAL PATHOLOGY

## 2017-09-21 NOTE — Progress Notes (Signed)
  Oncology Nurse Navigator Documentation  Navigator Location: [facility] (09/21/17 1600)   )Navigator Encounter Type: Telephone (09/21/17 1600)                     Patient Visit Type: Follow-up;Surgery (09/21/17 1600) Treatment Phase: (Post-op phone call) (09/21/17 1600)                            Time Spent with Patient: 15 (09/21/17 1600)   Patient doing well post-op.  Confirmed follow-up appointments.

## 2017-09-21 NOTE — Telephone Encounter (Signed)
Patient was notified that the pathology showed the margins clear and lymph nodes negative.  She is aware that additional testing is pending to determine whether adjuvant chemotherapy will be recommended.  Follow-up Thursday as planned.

## 2017-09-21 NOTE — Progress Notes (Signed)
I prefer to get oncotype

## 2017-09-21 NOTE — Telephone Encounter (Signed)
Cancer Genetics            Telegenetics Initial Visit    Patient Name: Dana Obrien Patient DOB: Apr 24, 1946 Patient Age: 72 y.o. Phone Call Date: 09/21/2017  Referring Provider: Randa Evens, MD  Reason for Visit: Evaluate for hereditary susceptibility to cancer    Assessment and Plan:  . Dana Obrien's history is not suggestive of a hereditary predisposition to cancer, but given her personal history of bilateral breast cancers at ages 41 and 76, a genetics evaluation is reasonable.   . Testing is recommended to determine whether she has a pathogenic mutation that will impact her screening and risk-reduction for cancer. A negative result will be reassuring.  . Dana Obrien wished to pursue genetic testing, but wanted her insurance checked first to know whether there will be any cost to her. Once that is done, she will likely schedule a lab visit for a blood draw. Analysis will include the 47 genes on [facility]'s Common Cancers panel (APC, ATM, AXIN2, BARD1, BMPR1A, BRCA1, BRCA2, BRIP1, CDH1, CDK4, CDKN2A, CHEK2, CTNNA1, DICER1, EPCAM, GREM1, HOXB13, KIT, MEN1, MLH1, MSH2, MSH3, MSH6, MUTYH, NBN, NF1, NTHL1, PALB2, PDGFRA, PMS2, POLD1, POLE, PTEN, RAD50, RAD51C, RAD51D, SDHA, SDHB, SDHC, SDHD, SMAD4, SMARCA4, STK11, TP53, TSC1, TSC2, VHL).   . Once the lab receives her specimen, results should be available in approximately 2-3 weeks, at which point we will contact her and address implications for her as well as address genetic testing for at-risk family members, if needed.     Dr. Grayland Ormond was available for questions concerning this case. Total time spent by counseling by phone was approximately 25 minutes.   _____________________________________________________________________   History of Present Illness: Dana Obrien, a 72 y.o. female, was referred for genetic counseling to discuss the possibility of a hereditary predisposition to cancer and discuss  whether genetic testing is warranted. This was a telegenetics visit via phone.  Dana Obrien was recently diagnosed with left breast cancer at the age of 68. She is s/p lumpectomy and is awaiting to learn whether radiation is next. The breast tumor was ER positive, PR positive, and HER2 negative.  In 2004, at age 79, she was diagnosed with right breast cancer and had a lumpectomy, chemotherapy and radiation, followed by Tamoxifen.    Malignant neoplasm of upper-outer quadrant of left breast in female, estrogen receptor positive ([place])   09/14/2017 Initial Diagnosis    Malignant neoplasm of upper-outer quadrant of left breast in female, estrogen receptor positive ([place])      09/14/2017 Cancer Staging    Staging form: Breast, AJCC 8th Edition - Clinical stage from 09/14/2017: Stage IA (cT1c, cN0, cM0, G1, ER+, PR+, HER2-) - Signed by Sindy Guadeloupe, MD on 09/14/2017       Past Medical History:  Diagnosis Date  . Anxiety   . Arthritis   . Asthma   . Breast cancer [facility]) 2004   right breast  . Cancer [facility]) 2004   right lumpectomy, chemotherapy and radiation Dr. Jeb Levering  . Depression   . GERD (gastroesophageal reflux disease)   . Hypertension   . Migraine   . Personal history of chemotherapy   . Personal history of radiation therapy   . PONV (postoperative nausea and vomiting)   . Shingles   . Sleep apnea    uses CPAP, severe OSA  . Vitamin D deficiency    IN THE PAST  Past Surgical History:  Procedure Laterality Date  . ABDOMINAL HYSTERECTOMY    . ANTERIOR AND POSTERIOR REPAIR    . BREAST BIOPSY Right    stereo prior to lumpectomy  . BREAST BIOPSY Left 09/07/2017   US guided biopsy  . BREAST EXCISIONAL BIOPSY Right 2004   chemo and rad  . BREAST LUMPECTOMY Right 2004  . BREAST LUMPECTOMY Left 09/17/2017   Procedure: BREAST EXCISION, SENTINEL NODE BIOPSY;  Surgeon: Robert Bellow, MD;  Location: [facility]S;  Service: General;  Laterality: Left;  . ETHMOIDECTOMY Bilateral  11/05/2016   Procedure: ETHMOIDECTOMY;  Surgeon: Melida Quitter, MD;  Location: [place];  Service: ENT;  Laterality: Bilateral;  . MAXILLARY ANTROSTOMY Bilateral 11/05/2016   Procedure: MAXILLARY ANTROSTOMY;  Surgeon: Melida Quitter, MD;  Location: [place];  Service: ENT;  Laterality: Bilateral;  . SINUS ENDO W/FUSION Bilateral 11/05/2016   Procedure: FRONTAL RECESS EXPLORATION;  Surgeon: Melida Quitter, MD;  Location: [place];  Service: ENT;  Laterality: Bilateral;  BILATERAL ENDOSCOPIC SINUS SURGERY WITH FUSION  . SINUS EXPLORATION  11/05/2016  . SPHENOIDECTOMY Bilateral 11/05/2016   Procedure: SPHENOIDECTOMY;  Surgeon: Melida Quitter, MD;  Location: [facility] OR;  Service: ENT;  Laterality: Bilateral;    Family History: Dana Obrien has a very large family with no reported cancers at all. One maternal half-sister had pre-cancerous colon polyps, which she initially reported to her providers as colon cancer.  Dana Obrien has a son (age 47) and a daughter (age 15). She has no full siblings. She has a maternal half-brother and 3 half-sisters and she has a paternal half-brother and half-sister. Her mother died at 43. She had 2 brothers and 2 sisters. Her father died at 47. He had 4 brothers and 2 sisters. None of her 4 grandparents reportedly had cancer.  Dana Obrien's ancestry is Caucasian - NOS. There is no known Jewish ancestry and no consanguinity.  Discussion: We reviewed the characteristics, features and inheritance patterns of hereditary cancer syndromes. We discussed her risk of harboring a mutation in the context of her personal and family history. We discussed the process of genetic testing, insurance coverage and implications of results: positive, negative and variant of unknown significance (VUS).   Dana Obrien questions were answered to her satisfaction today and she is welcome to call with any additional questions or concerns. Thank you for the referral and allowing [place] to share in the care of your  patient.    Steele Berg, MS, [place] Certified Genetic Counselor phone: [phone]

## 2017-09-22 ENCOUNTER — Telehealth: Payer: Self-pay | Admitting: *Deleted

## 2017-09-22 ENCOUNTER — Telehealth: Payer: Self-pay | Admitting: Oncology

## 2017-09-22 ENCOUNTER — Encounter: Payer: Self-pay | Admitting: General Surgery

## 2017-09-22 NOTE — Telephone Encounter (Signed)
-----   Message from Robert Bellow, MD sent at 09/21/2017  5:14 PM EDT ----- Please asked pathology to send tissue out for Oncotype DX testing.  Thank you ----- Message ----- From: Sindy Guadeloupe, MD Sent: 09/21/2017   3:22 PM To: Robert Bellow, MD  I prefer to get oncotype

## 2017-09-22 NOTE — Telephone Encounter (Signed)
Please send one EDTA to [facility] for [facility]. Per Steele Berg & Dr Rao/schd Theora Gianotti. Appt schd and conf with patient for 04/25 @ 9.  Msg sent to Ofri/Rao.

## 2017-09-22 NOTE — Telephone Encounter (Signed)
Oncotype order form and pathology faxed to [facility].

## 2017-09-23 ENCOUNTER — Encounter: Payer: Self-pay | Admitting: General Surgery

## 2017-09-23 ENCOUNTER — Ambulatory Visit: Payer: Self-pay

## 2017-09-23 ENCOUNTER — Ambulatory Visit (INDEPENDENT_AMBULATORY_CARE_PROVIDER_SITE_OTHER): Payer: PPO | Admitting: General Surgery

## 2017-09-23 ENCOUNTER — Inpatient Hospital Stay: Payer: PPO

## 2017-09-23 VITALS — BP 122/80 | HR 73 | Resp 12 | Ht 64.0 in | Wt 204.0 lb

## 2017-09-23 DIAGNOSIS — C50412 Malignant neoplasm of upper-outer quadrant of left female breast: Secondary | ICD-10-CM

## 2017-09-23 DIAGNOSIS — Z853 Personal history of malignant neoplasm of breast: Secondary | ICD-10-CM | POA: Diagnosis not present

## 2017-09-23 NOTE — Progress Notes (Signed)
Patient ID: Dana Obrien, female   DOB: 1946/01/12, 72 y.o.   MRN: [id number]  Chief Complaint  Patient presents with  . Routine Post Op    HPI [name] is a 72 y.o. female.  Here for postoperative left breast lumpectomy 09-17-17. She states she is doing well, minimal pain only soreness. She has noticed a little dizziness but has come off her blood pressure medication, per Dr Caryl Bis.  HPI  Past Medical History:  Diagnosis Date  . Anxiety   . Arthritis   . Asthma   . Breast cancer [facility]) 2004   right breast  . Cancer [facility]) 2004   right lumpectomy, chemotherapy and radiation Dr. Jeb Levering  . Depression   . GERD (gastroesophageal reflux disease)   . Hypertension   . Migraine   . Personal history of chemotherapy   . Personal history of radiation therapy   . PONV (postoperative nausea and vomiting)   . Shingles   . Sleep apnea    uses CPAP, severe OSA  . Vitamin D deficiency    IN THE PAST    Past Surgical History:  Procedure Laterality Date  . ABDOMINAL HYSTERECTOMY    . ANTERIOR AND POSTERIOR REPAIR    . BREAST BIOPSY Right    stereo prior to lumpectomy  . BREAST BIOPSY Left 09/07/2017   US guided biopsy  . 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  3.5 cm maximum diameter, minimal margins 5 mm.  In situ component less than 10%.  Marland Kitchen BREAST LUMPECTOMY Right 2004  . BREAST LUMPECTOMY Left 09/17/2017   Procedure: BREAST EXCISION, SENTINEL NODE BIOPSY;  Surgeon: Robert Bellow, MD;  Location: [facility];  Service: General;  Laterality: Left;  . ETHMOIDECTOMY Bilateral 11/05/2016   Procedure: ETHMOIDECTOMY;  Surgeon: Melida Quitter, MD;  Location: [place];  Service: ENT;  Laterality: Bilateral;  . MAXILLARY ANTROSTOMY Bilateral 11/05/2016   Procedure: MAXILLARY ANTROSTOMY;  Surgeon: Melida Quitter, MD;  Location: [place];  Service: ENT;  Laterality: Bilateral;  . SINUS ENDO W/FUSION Bilateral 11/05/2016    Procedure: FRONTAL RECESS EXPLORATION;  Surgeon: Melida Quitter, MD;  Location: [place];  Service: ENT;  Laterality: Bilateral;  BILATERAL ENDOSCOPIC SINUS SURGERY WITH FUSION  . SINUS EXPLORATION  11/05/2016  . SPHENOIDECTOMY Bilateral 11/05/2016   Procedure: SPHENOIDECTOMY;  Surgeon: Melida Quitter, MD;  Location: [facility] OR;  Service: ENT;  Laterality: Bilateral;    Family History  Problem Relation Age of Onset  . Breast cancer Neg Hx     Social History Social History   Tobacco Use  . Smoking status: Never Smoker  . Smokeless tobacco: Never Used  Substance Use Topics  . Alcohol use: Yes    Comment: rarely  . Drug use: No    Allergies  Allergen Reactions  . Lidocaine Palpitations  . Codeine Nausea Only and Nausea And Vomiting  . Morphine Nausea Only  . Niacin Rash  . Niacin And Related Rash    ANTIHYPERLIPEMICS    Current Outpatient Medications  Medication Sig Dispense Refill  . albuterol (PROVENTIL HFA;VENTOLIN HFA) 108 (90 Base) MCG/ACT inhaler Inhale 2 puffs into the lungs every 6 (six) hours as needed. 1 Inhaler 3  . ALPRAZolam (XANAX) 0.5 MG tablet Take 1 tablet (0.5 mg total) by mouth 2 (two) times daily as needed for anxiety. 90 tablet 1  . Azelastine-Fluticasone 137-50 MCG/ACT SUSP Place 1 spray into the nose 2 (two) times daily. 23 g  4  . buPROPion (WELLBUTRIN XL) 150 MG 24 hr tablet Take 1 tablet (150 mg total) by mouth daily. 90 tablet 1  . celecoxib (CELEBREX) 100 MG capsule Take 1 capsule (100 mg total) by mouth 2 (two) times daily. 180 capsule 1  . docusate sodium (COLACE) 100 MG capsule Take 1 tablet once or twice daily as needed for constipation while taking narcotic pain medicine 30 capsule 0  . EQ ALLERGY RELIEF, CETIRIZINE, 10 MG tablet TAKE 1 TABLET BY MOUTH ONCE DAILY 90 tablet 1  . FLUoxetine (PROZAC) 20 MG capsule TAKE THREE CAPSULES BY MOUTH ONCE DAILY 90 capsule 1  . hydrochlorothiazide (HYDRODIURIL) 12.5 MG tablet Take 1 tablet (12.5 mg total) by mouth  daily. 90 tablet 3  . lidocaine-prilocaine (EMLA) cream Apply 1 application topically as needed. Apply to left areola the morning of surgery, cover with Saran wrap. 5 g 0  . Melatonin 10 MG TABS Take 1 tablet by mouth at bedtime.    . montelukast (SINGULAIR) 10 MG tablet Take 1 tablet (10 mg total) by mouth at bedtime. 30 tablet 3  . Multiple Vitamin (MULTIVITAMIN) capsule Take 1 capsule by mouth daily.    . pantoprazole (PROTONIX) 40 MG tablet Take 1 tablet (40 mg total) by mouth daily. 90 tablet 2  . promethazine (PHENERGAN) 12.5 MG tablet Take 1 tablet (12.5 mg total) by mouth every 6 (six) hours as needed for nausea or vomiting. 30 tablet 0  . rosuvastatin (CRESTOR) 20 MG tablet Take 1 tablet (20 mg total) by mouth daily. 90 tablet 3  . traMADol (ULTRAM) 50 MG tablet Take 1 tablet (50 mg total) by mouth every 6 (six) hours as needed for moderate pain. 90 tablet 1  . tretinoin (RETIN-A) 0.05 % cream Apply 1 application topically at bedtime. 45 g 0   No current facility-administered medications for this visit.     Review of Systems Review of Systems  Constitutional: Negative.   Respiratory: Negative.   Cardiovascular: Negative.     Blood pressure 122/80, pulse 73, resp. rate 12, height 5\' 4"  (1.626 m), weight 204 lb (92.5 kg), SpO2 96 %.  Physical Exam Physical Exam  Constitutional: She is oriented to person, place, and time. She appears well-developed and well-nourished.  Cardiovascular: Normal rate, regular rhythm and normal heart sounds.  Pulmonary/Chest: Effort normal and breath sounds normal.    Neurological: She is alert and oriented to person, place, and time.  Skin: Skin is warm and dry.    Data Reviewed 13 mm tumor, clear margins.  Sentinel node negative.  Oncotype pending.  (Per medical oncology preference)  Ultrasound examination of the wide excision site shows a 2 x 3.7 x 3.8 cm seroma cavity well below the skin surface.  Adequate for accelerated partial breast  radiation.  Assessment    Doing well post wide excision left upper outer quadrant breast cancer.    Plan    Follow up appointment to be announced. Patient to meet with Dr. Baruch Gouty. Wear bra day and night.      HPI, Physical Exam, Assessment and Plan have been scribed under the direction and in the presence of Robert Bellow, MD. Karie Fetch, RN  I have completed the exam and reviewed the above documentation for accuracy and completeness.  I agree with the above.  Haematologist has been used and any errors in dictation or transcription are unintentional.  Hervey Ard, M.D., F.A.C.S.   Forest Gleason Terrion Poblano 09/23/2017, 1:14 PM

## 2017-09-23 NOTE — Patient Instructions (Addendum)
The patient is aware to call back for any questions or concerns. Follow up appointment to be announced. Patient to meet with Dr. Baruch Gouty. Wear bra day and night.

## 2017-09-27 ENCOUNTER — Ambulatory Visit
Admission: RE | Admit: 2017-09-27 | Discharge: 2017-09-27 | Disposition: A | Discharge status: Home / Self Care | Payer: PPO | Source: Ambulatory Visit | Attending: Radiation Oncology | Admitting: Radiation Oncology

## 2017-09-27 ENCOUNTER — Other Ambulatory Visit: Payer: Self-pay

## 2017-09-27 ENCOUNTER — Encounter: Payer: Self-pay | Admitting: Radiation Oncology

## 2017-09-27 VITALS — BP 138/70 | Temp 98.2°F | Resp 18 | Wt 204.1 lb

## 2017-09-27 DIAGNOSIS — G4730 Sleep apnea, unspecified: Secondary | ICD-10-CM | POA: Diagnosis not present

## 2017-09-27 DIAGNOSIS — Z9221 Personal history of antineoplastic chemotherapy: Secondary | ICD-10-CM | POA: Diagnosis not present

## 2017-09-27 DIAGNOSIS — Z170 Estrogen receptor positive status [ER+]: Secondary | ICD-10-CM | POA: Diagnosis not present

## 2017-09-27 DIAGNOSIS — C50412 Malignant neoplasm of upper-outer quadrant of left female breast: Secondary | ICD-10-CM | POA: Diagnosis not present

## 2017-09-27 DIAGNOSIS — M129 Arthropathy, unspecified: Secondary | ICD-10-CM | POA: Diagnosis not present

## 2017-09-27 DIAGNOSIS — K219 Gastro-esophageal reflux disease without esophagitis: Secondary | ICD-10-CM | POA: Diagnosis not present

## 2017-09-27 DIAGNOSIS — F418 Other specified anxiety disorders: Secondary | ICD-10-CM | POA: Diagnosis not present

## 2017-09-27 DIAGNOSIS — I10 Essential (primary) hypertension: Secondary | ICD-10-CM | POA: Diagnosis not present

## 2017-09-27 DIAGNOSIS — Z923 Personal history of irradiation: Secondary | ICD-10-CM | POA: Insufficient documentation

## 2017-09-27 DIAGNOSIS — Z79899 Other long term (current) drug therapy: Secondary | ICD-10-CM | POA: Diagnosis not present

## 2017-09-27 DIAGNOSIS — J45909 Unspecified asthma, uncomplicated: Secondary | ICD-10-CM | POA: Diagnosis not present

## 2017-09-27 DIAGNOSIS — Z853 Personal history of malignant neoplasm of breast: Secondary | ICD-10-CM | POA: Insufficient documentation

## 2017-09-27 DIAGNOSIS — E559 Vitamin D deficiency, unspecified: Secondary | ICD-10-CM | POA: Insufficient documentation

## 2017-09-27 NOTE — Consult Note (Signed)
NEW PATIENT EVALUATION  Name: Dana Obrien  MRN: [id number]  Date:   09/27/2017     DOB: 12-17-1945   This 72 y.o. 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  REFERRING PHYSICIAN: Leone Haven, MD  CHIEF COMPLAINT:  Chief Complaint  Patient presents with  . Breast Cancer    Initial Evaluation    DIAGNOSIS: The encounter diagnosis was Malignant neoplasm of upper-outer quadrant of left breast in female, estrogen receptor positive ([place]).   PREVIOUS INVESTIGATIONS:  mammogram and ultrasound reviewed Clinical notes reviewed Pathology reports reviewed   HPI: patient is a 72 year old female well-known to department having received whole breast radiation to her right breast back in 2004. She received adjuvant chemotherapy at that time as well as 5 years of Aromasin. She recently presented with an abnormal mammogram showing a  Lesion at the left breast at the 2:00 position confirmed on ultrasound and suspicious for malignancy. She underwent a ultrasound-guided biopsy which was positive for invasive mammary carcinoma.she underwent wide local excision of the left breast showing overall grade 2 invasive mammary carcinoma measuring 1.3 cm. Margins were clear by 3 mm.3 axillary lymph sentinel nodes were negative for malignancy. She's had evaluation by ultrasound and is thought to have a seroma capable of MammoSite balloon catheter placement. She is seen today in routine follow-up is doing well she still little bit sore. She still has a Oncotype DX pending. She seen today for consideration of adjuvant radiation therapy.  PLANNED TREATMENT REGIMEN: possible accelerated partial breast radiation to left breast  PAST MEDICAL HISTORY:  has a past medical history of Anxiety, Arthritis, Asthma, Breast cancer ([place]) (2004),  Breast cancer of upper-outer quadrant of left female breast ([place]) (09/17/2017), Cancer ([place]) (2004), Depression, GERD (gastroesophageal reflux disease), Hypertension, Migraine, Personal history of chemotherapy, Personal history of radiation therapy, PONV (postoperative nausea and vomiting), Shingles, Sleep apnea, and Vitamin D deficiency.    PAST SURGICAL HISTORY:  Past Surgical History:  Procedure Laterality Date  . ABDOMINAL HYSTERECTOMY    . ANTERIOR AND POSTERIOR REPAIR    . BREAST BIOPSY Right    stereo prior to lumpectomy  . BREAST BIOPSY Left 09/07/2017   US guided biopsy  . 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  3.5 cm maximum diameter, minimal margins 5 mm.  In situ component less than 10%.  Mar[facility] Kitchen BREAST LUMPECTOMY Right 2004  . BREAST LUMPECTOMY Left 09/17/2017   Procedure: BREAST EXCISION, SENTINEL NODE BIOPSY;  Surgeon: Robert Bellow, MD;  Location: [facility];  Service: General;  Laterality: Left;  . ETHMOIDECTOMY Bilateral 11/05/2016   Procedure: ETHMOIDECTOMY;  Surgeon: Melida Quitter, MD;  Location: [place];  Service: ENT;  Laterality: Bilateral;  . MAXILLARY ANTROSTOMY Bilateral 11/05/2016   Procedure: MAXILLARY ANTROSTOMY;  Surgeon: Melida Quitter, MD;  Location: [place];  Service: ENT;  Laterality: Bilateral;  . SINUS ENDO W/FUSION Bilateral 11/05/2016   Procedure: FRONTAL RECESS EXPLORATION;  Surgeon: Melida Quitter, MD;  Location: [place];  Service: ENT;  Laterality: Bilateral;  BILATERAL ENDOSCOPIC SINUS SURGERY WITH FUSION  . SINUS EXPLORATION  11/05/2016  . SPHENOIDECTOMY Bilateral 11/05/2016   Procedure: SPHENOIDECTOMY;  Surgeon: Melida Quitter, MD;  Location: [place];  Service: ENT;  Laterality: Bilateral;    FAMILY HISTORY: family history is not on file.  SOCIAL  HISTORY:  reports that she has never smoked. She has never used smokeless tobacco. She reports that she drinks alcohol. She reports that she does not use  drugs.  ALLERGIES: Lidocaine; Codeine; Morphine; Niacin; and Niacin and related  MEDICATIONS:  Current Outpatient Medications  Medication Sig Dispense Refill  . albuterol (PROVENTIL HFA;VENTOLIN HFA) 108 (90 Base) MCG/ACT inhaler Inhale 2 puffs into the lungs every 6 (six) hours as needed. 1 Inhaler 3  . ALPRAZolam (XANAX) 0.5 MG tablet Take 1 tablet (0.5 mg total) by mouth 2 (two) times daily as needed for anxiety. 90 tablet 1  . Azelastine-Fluticasone 137-50 MCG/ACT SUSP Place 1 spray into the nose 2 (two) times daily. 23 g 4  . buPROPion (WELLBUTRIN XL) 150 MG 24 hr tablet Take 1 tablet (150 mg total) by mouth daily. 90 tablet 1  . celecoxib (CELEBREX) 100 MG capsule Take 1 capsule (100 mg total) by mouth 2 (two) times daily. 180 capsule 1  . docusate sodium (COLACE) 100 MG capsule Take 1 tablet once or twice daily as needed for constipation while taking narcotic pain medicine 30 capsule 0  . EQ ALLERGY RELIEF, CETIRIZINE, 10 MG tablet TAKE 1 TABLET BY MOUTH ONCE DAILY 90 tablet 1  . FLUoxetine (PROZAC) 20 MG capsule TAKE THREE CAPSULES BY MOUTH ONCE DAILY 90 capsule 1  . hydrochlorothiazide (HYDRODIURIL) 12.5 MG tablet Take 1 tablet (12.5 mg total) by mouth daily. 90 tablet 3  . lidocaine-prilocaine (EMLA) cream Apply 1 application topically as needed. Apply to left areola the morning of surgery, cover with Saran wrap. 5 g 0  . Melatonin 10 MG TABS Take 1 tablet by mouth at bedtime.    . montelukast (SINGULAIR) 10 MG tablet Take 1 tablet (10 mg total) by mouth at bedtime. 30 tablet 3  . Multiple Vitamin (MULTIVITAMIN) capsule Take 1 capsule by mouth daily.    . pantoprazole (PROTONIX) 40 MG tablet Take 1 tablet (40 mg total) by mouth daily. 90 tablet 2  . promethazine (PHENERGAN) 12.5 MG tablet Take 1 tablet (12.5 mg total) by mouth every 6 (six) hours as needed for nausea or vomiting. 30 tablet 0  . rosuvastatin (CRESTOR) 20 MG tablet Take 1 tablet (20 mg total) by mouth daily. 90 tablet 3   . traMADol (ULTRAM) 50 MG tablet Take 1 tablet (50 mg total) by mouth every 6 (six) hours as needed for moderate pain. 90 tablet 1  . tretinoin (RETIN-A) 0.05 % cream Apply 1 application topically at bedtime. 45 g 0   No current facility-administered medications for this encounter.     ECOG PERFORMANCE STATUS:  0 - Asymptomatic  REVIEW OF SYSTEMS:  Patient denies any weight loss, fatigue, weakness, fever, chills or night sweats. Patient denies any loss of vision, blurred vision. Patient denies any ringing  of the ears or hearing loss. No irregular heartbeat. Patient denies heart murmur or history of fainting. Patient denies any chest pain or pain radiating to her upper extremities. Patient denies any shortness of breath, difficulty breathing at night, cough or hemoptysis. Patient denies any swelling in the lower legs. Patient denies any nausea vomiting, vomiting of blood, or coffee ground material in the vomitus. Patient denies any stomach pain. Patient states has had normal bowel movements no significant constipation or diarrhea. Patient denies any dysuria, hematuria or significant nocturia. Patient denies any problems walking, swelling in the joints or loss of balance. Patient denies any skin changes, loss of hair or loss of weight. Patient denies any  excessive worrying or anxiety or significant depression. Patient denies any problems with insomnia. Patient denies excessive thirst, polyuria, polydipsia. Patient denies any swollen glands, patient denies easy bruising or easy bleeding. Patient denies any recent infections, allergies or URI. Patient "s visual fields have not changed significantly in recent time.    PHYSICAL EXAM: BP 138/70   Temp 98.2 F (36.8 C)   Resp 18   Wt 204 lb 2.3 oz (92.6 kg)   BMI 35.04 kg/m  Right breast is somewhat contracted consistent with prior radiation therapy. She also has nipple inversion. She is a wide local excision scar of the left breast which is healing  well no dominant mass or nodularity is noted in either breast in 2 positions examined. No axillary or supraclavicular adenopathy is identified. Well-developed well-nourished patient in NAD. HEENT reveals PERLA, EOMI, discs not visualized.  Oral cavity is clear. No oral mucosal lesions are identified. Neck is clear without evidence of cervical or supraclavicular adenopathy. Lungs are clear to A&P. Cardiac examination is essentially unremarkable with regular rate and rhythm without murmur rub or thrill. Abdomen is benign with no organomegaly or masses noted. Motor sensory and DTR levels are equal and symmetric in the upper and lower extremities. Cranial nerves II through XII are grossly intact. Proprioception is intact. No peripheral adenopathy or edema is identified. No motor or sensory levels are noted. Crude visual fields are within normal range.  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  PLAN: at this time I believe patient is a suitable candidate for accelerated partial breast radiation of her left breast. I will refer her back and coordinate with surgeon for balloon catheter placement. Would then plan 3400 cGy in 10 fractions at 340 C twice a day using high dose rate remote afterloading.We'll see her after her balloon is placed forconfirmation of balloon characteristics in anticipation of high-dose rate remote afterloading. Risks and benefits of treatment includingpossiblesmall area of skin reaction fatigue permanent thickening of her lumpectomy site all were discussed in detail with the patient. She seems to comprehend my treatment plan well.  I would like to take this opportunity to thank you for allowing me to participate in the care  of your patient.Noreene Filbert, MD

## 2017-09-29 ENCOUNTER — Telehealth: Payer: Self-pay | Admitting: *Deleted

## 2017-09-29 DIAGNOSIS — Z170 Estrogen receptor positive status [ER+]: Secondary | ICD-10-CM | POA: Diagnosis not present

## 2017-09-29 DIAGNOSIS — C50412 Malignant neoplasm of upper-outer quadrant of left female breast: Secondary | ICD-10-CM | POA: Diagnosis not present

## 2017-09-29 NOTE — Telephone Encounter (Signed)
Mammosite schedule. Placement  10-19-17  at [facility] at 8:00 Scan 10-20-17 Treat [phone] Aware the [facility] will be calling her for more details Aware of ATB and directions reviewed. Aware no showers and to wear her bra while mammosite in place.

## 2017-09-30 ENCOUNTER — Encounter: Payer: Self-pay | Admitting: General Surgery

## 2017-09-30 ENCOUNTER — Encounter: Payer: Self-pay | Admitting: *Deleted

## 2017-09-30 MED ORDER — CEFADROXIL 500 MG PO CAPS: 500.0000 mg | ORAL_CAPSULE | Freq: Two times a day (BID) | ORAL | 0 refills | Status: DC

## 2017-09-30 NOTE — Telephone Encounter (Signed)
Results for Onco type results reviewed with patient. She is aware of instructions for Mammosite placement on 10/19/17.

## 2017-09-30 NOTE — Telephone Encounter (Signed)
Please let patient know Oncotype results were "great" low score, no need for chemotherapy but the results would be available for Medical Oncology to review as well.

## 2017-10-01 ENCOUNTER — Telehealth: Payer: Self-pay | Admitting: Genetic Counselor

## 2017-10-01 NOTE — Telephone Encounter (Addendum)
Cancer Genetics             Telegenetics Results Disclosure   Patient Name: Dana Obrien Patient DOB: [birth year] Patient Age: 72 y.o. Phone Call Date: 10/01/2017  Referring Provider: Randa Evens, MD    Dana Obrien was called today to discuss genetic test results. Please see the Genetics telephone note from 09/21/2017 for a detailed discussion of her personal and family histories and the recommendations provided.  Genetic Testing: At the time of Dana Obrien telegenetics visit, she decided to pursue genetic testing of multiple genes associated with hereditary susceptibility to cancer. Testing included sequencing and deletion/duplication analysis. Testing did not reveal a pathogenic mutation in any of the genes analyzed.  A copy of the genetic test report will be scanned into Epic under the Media tab.  The genes analyzed were the 47 genes on [facility]'s Common Cancers panel (APC, ATM, AXIN2, BARD1, BMPR1A, BRCA1, BRCA2, BRIP1, CDH1, CDK4, CDKN2A, CHEK2, CTNNA1, DICER1, EPCAM, GREM1, HOXB13, KIT, MEN1, MLH1, MSH2, MSH3, MSH6, MUTYH, NBN, NF1, NTHL1, PALB2, PDGFRA, PMS2, POLD1, POLE, PTEN, RAD50, RAD51C, RAD51D, SDHA, SDHB, SDHC, SDHD, SMAD4, SMARCA4, STK11, TP53, TSC1, TSC2, VHL).  Since the current test is not perfect, it is possible that there may be a gene mutation that current testing cannot detect, but that chance is small. It is possible that a different genetic factor, which has not yet been discovered or is not on this panel, is responsible for the cancer diagnoses in the family. Again, the likelihood of this is low. No additional testing is recommended at this time for Dana Obrien.  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 This is still considered a normal result. While at this time, it is unknown if this finding is associated with increased cancer risk, the majority of these variants get reclassified to be  inconsequential. Medical management should not be based on this finding. With time, we suspect the lab will determine the significance, if any. If we do learn more about it, we will try to contact Dana Obrien to discuss it further. It is important to stay in touch with [place] periodically and keep the address and phone number up to date. 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 As before, these are normal results. She was given access to this new report through the lab's portal.   Cancer Screening: These results suggest that Dana Obrien bilateral breast cancers were most likely not due to an inherited predisposition. Most cancers happen by chance and this test, along with details of her family history, suggests that her cancers fall into this category. She is recommended to follow the cancer screening guidelines provided by her physician.   Family Members: Family members are at some increased risk of developing cancer, over the general population risk, simply due to the family history. Women are recommended to have a yearly mammogram beginning at age 20, a yearly clinical breast exam, a yearly gynecologic exam and perform monthly breast self-exams. Colon cancer screening is recommended to begin by age 16 in both men and women, unless there is a family history of colon cancer or colon polyps or an individual has a personal history to warrant initiating screening at a younger age.  Any relative who had cancer  at a young age or had a particularly rare cancer may also wish to pursue genetic testing. Genetic counselors can be located in other cities, by visiting the website of the [employer] ([employer]) and Field seismologist for a Dietitian by zip code.   Family members are not recommended to get tested for the above VUS outside of a research protocol  as this finding has no implications for their medical management.     Lastly, cancer genetics is a rapidly advancing field and it is possible that new genetic tests will be appropriate for Dana Obrien in the future. We encourage Dana Obrien to remain in contact with Genetics on an annual basis so her personal and family histories can be updated.    Steele Berg, MS, [employer] Certified Genetic Counselor phone: [phone]

## 2017-10-05 ENCOUNTER — Encounter: Payer: Self-pay | Admitting: Oncology

## 2017-10-05 ENCOUNTER — Inpatient Hospital Stay: Payer: PPO | Attending: Oncology | Admitting: Oncology

## 2017-10-05 VITALS — BP 121/77 | HR 81 | Temp 98.1°F | Resp 18 | Ht 64.0 in | Wt 203.5 lb

## 2017-10-05 DIAGNOSIS — Z9221 Personal history of antineoplastic chemotherapy: Secondary | ICD-10-CM | POA: Diagnosis not present

## 2017-10-05 DIAGNOSIS — Z853 Personal history of malignant neoplasm of breast: Secondary | ICD-10-CM | POA: Diagnosis not present

## 2017-10-05 DIAGNOSIS — G8929 Other chronic pain: Secondary | ICD-10-CM

## 2017-10-05 DIAGNOSIS — C50412 Malignant neoplasm of upper-outer quadrant of left female breast: Secondary | ICD-10-CM

## 2017-10-05 DIAGNOSIS — Z170 Estrogen receptor positive status [ER+]: Secondary | ICD-10-CM

## 2017-10-05 DIAGNOSIS — Z780 Asymptomatic menopausal state: Secondary | ICD-10-CM

## 2017-10-05 DIAGNOSIS — Z923 Personal history of irradiation: Secondary | ICD-10-CM | POA: Diagnosis not present

## 2017-10-05 NOTE — Progress Notes (Signed)
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 T2N0M0.  She thenn completed 5 years of aromasin until 2009. She could not tolerate arimidex.  Recent screening mammogram on 08/25/2017 showed a possible mass in her left breast.  This was followed by an ultrasound and a diagnostic left mammogram which showed a 0.7 x 0.5 x 0.7 cm mass in the left breast.  No evidence of left axillary adenopathy.  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  ER greater than 90% positive, PR greater than 90% positive and HER-2/neu negative   Patient has been seen by Dr. Bary Castilla and is scheduled to undergo surgery on 09/17/2017  She is G2P2L2. Menarche at the age of 55. Menopause in her early 46's. She used to take prempro from [age] yrs but none since then. She does have chronic back pain and tingling/pain in her extremities. Cervical spine MRI showed egenrative disease  Final pathology showed: Invasive mammary carcinoma 13 mm, grade 1 with associated DCIS.  Negative margins.  0 out of 3 lymph nodes were positive for malignancy. 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  This has been ongoing for the last 1 week and she feels that the redness is slowly increasing.  Denies any fever.  She will be getting MammoSite next week  ECOG PS- 0 Pain scale- 0   Review of systems- Review of Systems  Constitutional: Negative for chills, fever, malaise/fatigue and weight loss.  HENT: Negative for congestion, ear discharge and nosebleeds.   Eyes: Negative for blurred vision.  Respiratory: Negative for cough, hemoptysis, sputum production, shortness of breath and wheezing.   Cardiovascular: Negative for chest pain, palpitations, orthopnea and claudication.  Gastrointestinal: Negative for abdominal pain, blood in stool, constipation, diarrhea, heartburn, melena, nausea and vomiting.  Genitourinary: Negative for dysuria, flank pain, frequency, hematuria and urgency.  Musculoskeletal: Negative for back pain, joint pain and myalgias.  Skin: Negative for rash.  Neurological: Negative for dizziness, tingling, focal weakness, seizures, weakness and headaches.  Endo/Heme/Allergies: Does not bruise/bleed easily.  Psychiatric/Behavioral: Negative for depression and suicidal ideas. The patient does not have insomnia.       Allergies  Allergen Reactions  . Lidocaine Palpitations  . Codeine Nausea Only and Nausea And Vomiting  . Morphine Nausea Only  . Niacin Rash  . Niacin And Related Rash    ANTIHYPERLIPEMICS     Past Medical History:  Diagnosis Date  . Anxiety   . Arthritis   . Asthma   . Breast cancer ([place]) 2004   right breast  .  Breast cancer of upper-outer quadrant of left female breast ([place]) 09/17/2017   T1c, N0, ER 90%, PR 90%, 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  . Cancer [facility]) 2004   right lumpectomy, chemotherapy and radiation Dr. Jeb Levering  . Depression   . GERD (gastroesophageal reflux  disease)   . Hypertension   . Migraine   . Personal history of chemotherapy   . Personal history of radiation therapy   . PONV (postoperative nausea and vomiting)   . Shingles   . Sleep apnea    uses CPAP, severe OSA  . Vitamin D deficiency    IN THE PAST     Past Surgical History:  Procedure Laterality Date  . ABDOMINAL HYSTERECTOMY    . ANTERIOR AND POSTERIOR REPAIR    . BREAST BIOPSY Right    stereo prior to lumpectomy  . BREAST BIOPSY Left 09/07/2017   US guided biopsy  . 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  3.5 cm maximum diameter, minimal margins 5 mm.  In situ component less than 10%.  Marland Kitchen BREAST LUMPECTOMY Right 2004  . BREAST LUMPECTOMY Left 09/17/2017   Procedure: BREAST EXCISION, SENTINEL NODE BIOPSY;  Surgeon: Robert Bellow, MD;  Location: [facility] ORS;  Service: General;  Laterality: Left;  . ETHMOIDECTOMY Bilateral 11/05/2016   Procedure: ETHMOIDECTOMY;  Surgeon: Melida Quitter, MD;  Location: [place];  Service: ENT;  Laterality: Bilateral;  . MAXILLARY ANTROSTOMY Bilateral 11/05/2016   Procedure: MAXILLARY ANTROSTOMY;  Surgeon: Melida Quitter, MD;  Location: [place];  Service: ENT;  Laterality: Bilateral;  . SINUS ENDO W/FUSION Bilateral 11/05/2016   Procedure: FRONTAL RECESS EXPLORATION;  Surgeon: Melida Quitter, MD;  Location: [place];  Service: ENT;  Laterality: Bilateral;  BILATERAL ENDOSCOPIC SINUS SURGERY WITH FUSION  . SINUS EXPLORATION  11/05/2016  . SPHENOIDECTOMY Bilateral 11/05/2016   Procedure: SPHENOIDECTOMY;  Surgeon: Melida Quitter, MD;  Location: [facility] OR;  Service: ENT;  Laterality: Bilateral;    Social History   Socioeconomic History  . Marital status: Legally Separated    Spouse name: Not on file  . Number of children: Not on file  . Years of education: Not on file  . Highest education level: Not on file  Occupational History  . Not on file  Social Needs  . Financial resource strain:  Not on file  . Food insecurity:    Worry: Not on file    Inability: Not on file  . Transportation needs:    Medical: Not on file    Non-medical: Not on file  Tobacco Use  . Smoking status: Never Smoker  . Smokeless tobacco: Never Used  Substance and Sexual Activity  . Alcohol use: Yes    Comment: rarely  . Drug use: No  . Sexual activity: Not on file  Lifestyle  . Physical activity:    Days per week: Not on file    Minutes per session: Not on file  . Stress: Not on file  Relationships  . Social connections:    Talks on phone: Not on file    Gets together: Not on file    Attends religious service: Not on file    Active member of club or organization: Not on file    Attends meetings of clubs or organizations: Not on file    Relationship status: Not on file  . Intimate partner violence:    Fear of current or ex partner: Not on file    Emotionally abused: Not on  file    Physically abused: Not on file    Forced sexual activity: Not on file  Other Topics Concern  . Not on file  Social History Narrative   Daily Caffeine Use:  1-2 coffee   Regular Exercise -  NO    Family History  Problem Relation Age of Onset  . Breast cancer Neg Hx      Current Outpatient Medications:  .  buPROPion (WELLBUTRIN XL) 150 MG 24 hr tablet, Take 1 tablet (150 mg total) by mouth daily., Disp: 90 tablet, Rfl: 1 .  cefadroxil (DURICEF) 500 MG capsule, Take 1 capsule (500 mg total) by mouth 2 (two) times daily. Start this one hour before office procedure on 10-19-17, Disp: 20 capsule, Rfl: 0 .  celecoxib (CELEBREX) 100 MG capsule, Take 1 capsule (100 mg total) by mouth 2 (two) times daily., Disp: 180 capsule, Rfl: 1 .  EQ ALLERGY RELIEF, CETIRIZINE, 10 MG tablet, TAKE 1 TABLET BY MOUTH ONCE DAILY, Disp: 90 tablet, Rfl: 1 .  FLUoxetine (PROZAC) 20 MG capsule, TAKE THREE CAPSULES BY MOUTH ONCE DAILY, Disp: 90 capsule, Rfl: 1 .  hydrochlorothiazide (HYDRODIURIL) 12.5 MG tablet, Take 1 tablet (12.5 mg  total) by mouth daily., Disp: 90 tablet, Rfl: 3 .  Melatonin 10 MG TABS, Take 1 tablet by mouth at bedtime., Disp: , Rfl:  .  montelukast (SINGULAIR) 10 MG tablet, Take 1 tablet (10 mg total) by mouth at bedtime., Disp: 30 tablet, Rfl: 3 .  Multiple Vitamin (MULTIVITAMIN) capsule, Take 1 capsule by mouth daily., Disp: , Rfl:  .  pantoprazole (PROTONIX) 40 MG tablet, Take 1 tablet (40 mg total) by mouth daily., Disp: 90 tablet, Rfl: 2 .  rosuvastatin (CRESTOR) 20 MG tablet, Take 1 tablet (20 mg total) by mouth daily., Disp: 90 tablet, Rfl: 3 .  traMADol (ULTRAM) 50 MG tablet, Take 1 tablet (50 mg total) by mouth every 6 (six) hours as needed for moderate pain., Disp: 90 tablet, Rfl: 1 .  tretinoin (RETIN-A) 0.05 % cream, Apply 1 application topically at bedtime., Disp: 45 g, Rfl: 0 .  albuterol (PROVENTIL HFA;VENTOLIN HFA) 108 (90 Base) MCG/ACT inhaler, Inhale 2 puffs into the lungs every 6 (six) hours as needed. (Patient not taking: Reported on 10/05/2017), Disp: 1 Inhaler, Rfl: 3 .  ALPRAZolam (XANAX) 0.5 MG tablet, Take 1 tablet (0.5 mg total) by mouth 2 (two) times daily as needed for anxiety. (Patient not taking: Reported on 10/05/2017), Disp: 90 tablet, Rfl: 1 .  Azelastine-Fluticasone 137-50 MCG/ACT SUSP, Place 1 spray into the nose 2 (two) times daily. (Patient not taking: Reported on 10/05/2017), Disp: 23 g, Rfl: 4 .  docusate sodium (COLACE) 100 MG capsule, Take 1 tablet once or twice daily as needed for constipation while taking narcotic pain medicine (Patient not taking: Reported on 10/05/2017), Disp: 30 capsule, Rfl: 0 .  lidocaine-prilocaine (EMLA) cream, Apply 1 application topically as needed. Apply to left areola the morning of surgery, cover with Saran wrap. (Patient not taking: Reported on 10/05/2017), Disp: 5 g, Rfl: 0 .  promethazine (PHENERGAN) 12.5 MG tablet, Take 1 tablet (12.5 mg total) by mouth every 6 (six) hours as needed for nausea or vomiting. (Patient not taking: Reported on  10/05/2017), Disp: 30 tablet, Rfl: 0  Physical exam:  Vitals:   10/05/17 1022  BP: 121/77  Pulse: 81  Resp: 18  Temp: 98.1 F (36.7 C)  TempSrc: Tympanic  SpO2: 97%  Weight: 203 lb 8 oz (92.3 kg)  Height:  5' 4" (1.626 m)   Physical Exam  Constitutional: She is oriented to person, place, and time. She appears well-developed and well-nourished.  HENT:  Head: Normocephalic and atraumatic.  Eyes: Pupils are equal, round, and reactive to light. EOM are normal.  Neck: Normal range of motion.  Cardiovascular: Normal rate, regular rhythm and normal heart sounds.  Pulmonary/Chest: Effort normal and breath sounds normal.  Abdominal: Soft. Bowel sounds are normal.  Neurological: She is alert and oriented to person, place, and time.  Skin: Skin is warm and dry.  There is mild erythema and fullness around the left areola  Local warmth positive.   CMP Latest Ref Rng & Units 09/13/2017  Glucose 65 - 99 mg/dL 104(H)  BUN 6 - 20 mg/dL 18  Creatinine 0.44 - 1.00 mg/dL 0.64  Sodium 135 - 145 mmol/L 139  Potassium 3.5 - 5.1 mmol/L 3.8  Chloride 101 - 111 mmol/L 103  CO2 22 - 32 mmol/L 27  Calcium 8.9 - 10.3 mg/dL 9.2  Total Protein 6.5 - 8.1 g/dL -  Total Bilirubin 0.3 - 1.2 mg/dL -  Alkaline Phos 38 - 126 U/L -  AST 15 - 41 U/L -  ALT 14 - 54 U/L -   CBC Latest Ref Rng & Units 09/13/2017  WBC 3.6 - 11.0 K/uL 8.4  Hemoglobin 12.0 - 16.0 g/dL 12.7  Hematocrit 35.0 - 47.0 % 37.0  Platelets 150 - 440 K/uL 246    No images are attached to the encounter.  Mr Cervical Spine Wo Contrast  Result Date: 09/08/2017 CLINICAL DATA:  Tingling from both shoulders to the fingers and bilateral hand aching for 3-4 weeks. No known injury. EXAM: MRI CERVICAL SPINE WITHOUT CONTRAST TECHNIQUE: Multiplanar, multisequence MR imaging of the cervical spine was performed. No intravenous contrast was administered. COMPARISON:  Plain film cervical spine 08/24/2017. FINDINGS: Alignment: 0.2 cm facet mediated  anterolisthesis C4 on C5, C5 on C6 and C7 on T1 is identified. Vertebrae: No fracture or worrisome lesion. There is some degenerative endplate signal change at C5-6 and C6-7. Cord: Normal signal throughout. Posterior Fossa, vertebral arteries, paraspinal tissues: Negative. Disc levels: C2-3: Mild-to-moderate facet degenerative change is worse on the left. No bulge or protrusion. The central canal and foramina are open. C3-4: Left much worse than right facet arthropathy. There is also some uncovertebral spurring, more notable on the left. Severe left and mild-to-moderate right foraminal narrowing is seen. C4-5: Advanced bilateral facet degenerative change is worse on the left. The disc is uncovered without bulging. The central canal and right foramen are widely patent. Moderate left foraminal narrowing is mainly due to facet arthropathy. C5-6: Advanced bilateral facet degenerative change is seen. The disc is uncovered with a shallow bulge and there is some uncovertebral disease. The central canal remains open. Moderately severe to severe bilateral foraminal narrowing is seen. C6-7: Broad-based disc bulge and uncovertebral spurring are seen. There is flattening of the ventral cord. Moderately severe to severe foraminal narrowing is worse on the right. C7-T1: Advanced bilateral facet degenerative change is worse on the right. The disc is uncovered without bulging. The central canal is open. Moderately severe to severe bilateral foraminal narrowing appears worse on the right. 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 Uncovertebral spurring and facet arthropathy cause moderately severe to severe bilateral foraminal narrowing, worse on the right. Severe left foraminal narrowing at C3-4 due to uncovertebral disease and advanced facet degenerative change. Moderate left foraminal narrowing at C4-5 is predominantly due to facet  degeneration. Moderately severe to severe bilateral foraminal narrowing at C5-6 is due to  advanced facet arthropathy and uncovertebral spurring. Moderately severe to severe bilateral foraminal narrowing at C7-T1 is main the due to facet arthropathy. Electronically Signed   By: Inge Rise M.D.   On: 09/08/2017 11:27   Nm Sentinel Node Injection  Result Date: 09/17/2017 CLINICAL DATA:  Left breast cancer. EXAM: NUCLEAR MEDICINE BREAST LYMPHOSCINTIGRAPHY LEFT BREAST TECHNIQUE: Intradermal injection of radiopharmaceutical was performed at the 12 o'clock, 3 o'clock, 6 o'clock, and 9 o'clock positions around the left nipple. The patient was then sent to the operating room. RADIOPHARMACEUTICALS:  Total of 1 mCi Millipore-filtered Technetium-32msulfur colloid, injected in four aliquots of 0.25 mCi each. IMPRESSION: Uncomplicated intradermal injection of a total of 1 mCi Technetium-971mulfur colloid for purposes of sentinel node identification. Electronically Signed   By: ThMarcello MooresRegister   On: 09/17/2017 09:34   UsKoreareast Complete Uni Left Inc Axilla  Result Date: 09/23/2017 Ultrasound examination of the wide excision site shows a 2 x 3.7 x 3.8 cm seroma cavity well below the skin surface.  Adequate for accelerated partial breast radiation.   UsKoreareast Complete Uni Left Inc Axilla  Result Date: 09/10/2017 Ultrasound examination of the left breast was undertaken to determine if preoperative wire localization would be required.  There is a well-defined hypoechoic mass in the 2 o'clock position of the left breast 5 cm from the nipple approximately 1.3 cm below the skin level with a corresponding biopsy clip within the lesion.  BI-RADS-6.   Mm Clip Placement Left  Addendum Date: 09/08/2017   ADDENDUM REPORT: 09/08/2017 16:18 ADDENDUM: PATHOLOGY ADDENDUM: Pathology: A. BREAST, LEFT, 2 O'CLOCK 5 CM FROM NIPPLE; ULTRASOUND-GUIDED CORE BIOPSY: - INVASIVE MAMMARY CARCINOMA, NO SPECIAL TYPE. Size of invasive carcinoma: 11 mm in this sample Histologic grade of invasive carcinoma: Grade 1 Ductal  carcinoma in situ: Not identified Lymphovascular invasion: Not identified Pathology concordance with imaging findings: Yes Recommendation: Consultation for treatment plan At the request of the patient, I spoke with her by telephone on 09/08/2017 at 16:18. She reports doing well after the biopsy . Electronically Signed   By: ElNolon Nations.D.   On: 09/08/2017 16:18   Result Date: 09/08/2017 CLINICAL DATA:  Post ultrasound-guided core needle biopsy of the left breast. EXAM: DIAGNOSTIC LEFT MAMMOGRAM POST ULTRASOUND BIOPSY COMPARISON:  Previous exam(s). FINDINGS: Mammographic images were obtained following the ultrasound guided biopsy of left breast. A two-view mammography demonstrates presence of wing shaped marker within the mass of question in the left 2 o'clock breast. Expected post biopsy changes. IMPRESSION: Successful placement of wing shaped marker post ultrasound-guided core needle biopsy of left breast 2 o'clock mass. Final Assessment: Post Procedure Mammograms for Marker Placement Electronically Signed: By: DoFidela Salisbury.D. On: 09/07/2017 09:58   UsKoreat Breast Bx W Loc Dev 1st Lesion Img Bx Spec UsKoreauide  Result Date: 09/07/2017 CLINICAL DATA:  Left breast 2 o'clock mass. EXAM: ULTRASOUND GUIDED LEFT BREAST CORE NEEDLE BIOPSY COMPARISON:  Previous exam(s). FINDINGS: I met with the patient and we discussed the procedure of ultrasound-guided biopsy, including benefits and alternatives. We discussed the high likelihood of a successful procedure. We discussed the risks of the procedure, including infection, bleeding, tissue injury, clip migration, and inadequate sampling. Informed written consent was given. The usual time-out protocol was performed immediately prior to the procedure. Lesion quadrant: Upper outer quadrant Using sterile technique and 1% Lidocaine as local anesthetic, under direct ultrasound visualization, a  14 gauge spring-loaded device was used to perform biopsy of the left breast  2 o'clock mass using a inferior approach. At the conclusion of the procedure a wing shaped tissue marker clip was deployed into the biopsy cavity. Follow up 2 view mammogram was performed and dictated separately. IMPRESSION: Ultrasound guided biopsy of left breast.  No apparent complications. Electronically Signed   By: Fidela Salisbury M.D.   On: 09/07/2017 09:52     Assessment and plan- Patient is a 72 y.o. female with newly diagnosed invasive mammary carcinoma of the left breast stage Ia PT 1 CP N0 cM0 ER PR positive and HER-2/neu negative  Discussed the results of the final pathology and Oncotype testing with her.  Given that her Oncotype score is less than 25 she would not require adjuvant chemotherapy.  This is her second breast cancer in the contralateral side.  She did receive 5 years of Aromasin in the past and her second breast cancer was after she stopped hormone therapy.  I would therefore recommend that she should go back on Aromasin at this time given that she has ER PR positive tumor. I would recommend that she should take it for 10 years instead of 5 given that this is her second breast cancer.  It would be okay for her to take Aromasin along with planned radiation which is for 5 days.   Discussed risks and benefits of Aromasin including all but not limited to fatigue, hot flashes, joint pains which patient understands and agrees to proceed as planned.  She will also take calcium and vitamin D 1200 mg and 800 international units respectively.  We will obtain a bone density scan at this time.  I will see her back in 6 weeks time to see how she is tolerating her Aromasin.  She has mild redness around her left areola which does not overtly appear to be infected at this time.  I will hold off on prescribing any antibiotics.  We did get in touch with Dr. Dwyane Luo office who would be seeing her in 2 days time before she heads out on 1 week's vacation   Visit Diagnosis 1. Malignant  neoplasm of upper-outer quadrant of left breast in female, estrogen receptor positive ([place])   2. Post-menopausal      Dr. Randa Evens, MD, MPH [facility] [phone] 10/05/2017 1:31 PM

## 2017-10-05 NOTE — Progress Notes (Signed)
Patient c/o sore throat x 1 day/ left niple red inflamed and painful / with touch

## 2017-10-07 ENCOUNTER — Encounter: Payer: PPO | Admitting: General Surgery

## 2017-10-07 ENCOUNTER — Encounter: Payer: Self-pay | Admitting: General Surgery

## 2017-10-07 NOTE — Progress Notes (Signed)
This encounter was created in error - please disregard.

## 2017-10-07 NOTE — Patient Instructions (Addendum)
T

## 2017-10-08 ENCOUNTER — Telehealth: Payer: Self-pay

## 2017-10-08 NOTE — Telephone Encounter (Signed)
-----   Message from Carson Myrtle, RN sent at 10/07/2017  4:57 PM EDT ----- She had a family emergency on Thursday and did not get to see Dr Bary Castilla. She states her breast is better(it was red) she may call and want you to see it on Friday. Dr Bary Castilla aware, he says to make her an appointment for next week so he can see it too.

## 2017-10-08 NOTE — Telephone Encounter (Signed)
Left message for patient to call to scheduled appointment to see Dr Bary Castilla next week.

## 2017-10-13 NOTE — Telephone Encounter (Signed)
Spoke with patient and she is currently on vacation and will not be back until Sunday. She reports that the nipple soreness is improved and that the redness is completely gone. She is aware of her instructions for her Mammosite placement on 10/19/17. She will get her antibiotic prescription picked up when she returns and is aware to take the first dose one hour prior.

## 2017-10-19 ENCOUNTER — Ambulatory Visit: Payer: Self-pay

## 2017-10-19 ENCOUNTER — Encounter: Payer: Self-pay | Admitting: General Surgery

## 2017-10-19 ENCOUNTER — Ambulatory Visit (INDEPENDENT_AMBULATORY_CARE_PROVIDER_SITE_OTHER): Payer: PPO | Admitting: General Surgery

## 2017-10-19 ENCOUNTER — Telehealth: Payer: Self-pay

## 2017-10-19 VITALS — BP 124/70 | HR 82 | Resp 16 | Ht 64.0 in | Wt 204.0 lb

## 2017-10-19 DIAGNOSIS — Z170 Estrogen receptor positive status [ER+]: Secondary | ICD-10-CM

## 2017-10-19 DIAGNOSIS — C50412 Malignant neoplasm of upper-outer quadrant of left female breast: Secondary | ICD-10-CM | POA: Diagnosis not present

## 2017-10-19 NOTE — Patient Instructions (Addendum)
The patient is aware to call back for any questions or concerns. Whole breast radiation

## 2017-10-19 NOTE — Telephone Encounter (Signed)
Patient called and says since her attempted procedure this morning that she has had stinging in the breast area. She denies any bleeding, bruising, or swelling. Patient advised to use ice to the area several times a day and Tylenol for pain relief. She will call back tomorrow if no improvement.

## 2017-10-19 NOTE — Progress Notes (Signed)
Patient ID: Dana Obrien, female   DOB: 1946-02-03, 72 y.o.   MRN: [id number]  Chief Complaint  Patient presents with  . Procedure    HPI Dana Obrien is a 72 y.o. female.  Here for left mammosite. HPI  Past Medical History:  Diagnosis Date  . Anxiety   . Arthritis   . Asthma   . Breast cancer ([place]) 2004   right breast  . Breast cancer of upper-outer quadrant of left female breast ([place]) 09/17/2017   T1c, N0, ER 90%, PR 90%, 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  . Cancer [facility]) 2004   right lumpectomy, chemotherapy and radiation Dr. Jeb Levering  . Depression   . GERD (gastroesophageal reflux disease)   . Hypertension   . Migraine   . Personal history of chemotherapy   . Personal history of radiation therapy   . PONV (postoperative nausea and vomiting)   . Shingles   . Sleep apnea    uses CPAP, severe OSA  . Vitamin D deficiency    IN THE PAST    Past Surgical History:  Procedure Laterality Date  . ABDOMINAL HYSTERECTOMY    . ANTERIOR AND POSTERIOR REPAIR    . BREAST BIOPSY Right    stereo prior to lumpectomy  . BREAST BIOPSY Left 09/07/2017   US guided biopsy  . 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  3.5 cm maximum diameter, minimal margins 5 mm.  In situ component less than 10%.  Marland Kitchen BREAST LUMPECTOMY Right 2004  . BREAST LUMPECTOMY Left 09/17/2017   Procedure: BREAST EXCISION, SENTINEL NODE BIOPSY;  Surgeon: Robert Bellow, MD;  Location: [facility] ORS;  Service: General;  Laterality: Left;  . ETHMOIDECTOMY Bilateral 11/05/2016   Procedure: ETHMOIDECTOMY;  Surgeon: Melida Quitter, MD;  Location: [place];  Service: ENT;  Laterality: Bilateral;  . MAXILLARY ANTROSTOMY Bilateral 11/05/2016   Procedure: MAXILLARY ANTROSTOMY;  Surgeon: Melida Quitter, MD;  Location: [place];  Service: ENT;  Laterality: Bilateral;  . SINUS ENDO W/FUSION Bilateral 11/05/2016   Procedure: FRONTAL RECESS EXPLORATION;  Surgeon: Melida Quitter, MD;  Location: [place];  Service: ENT;  Laterality: Bilateral;  BILATERAL ENDOSCOPIC SINUS SURGERY WITH FUSION  . SINUS EXPLORATION  11/05/2016  . SPHENOIDECTOMY Bilateral 11/05/2016   Procedure: SPHENOIDECTOMY;  Surgeon: Melida Quitter, MD;  Location: [facility];  Service: ENT;  Laterality: Bilateral;    Family History  Problem Relation Age of Onset  . Breast cancer Neg Hx     Social History Social History   Tobacco Use  . Smoking status: Never Smoker  . Smokeless tobacco: Never Used  Substance Use Topics  . Alcohol use: Yes    Comment: rarely  . Drug use: No    Allergies  Allergen Reactions  . Lidocaine Palpitations  . Codeine Nausea Only and Nausea And Vomiting  . Morphine Nausea Only  . Niacin Rash  . Niacin And Related Rash    ANTIHYPERLIPEMICS    Current Outpatient Medications  Medication Sig Dispense Refill  . albuterol (PROVENTIL HFA;VENTOLIN HFA) 108 (90 Base) MCG/ACT inhaler Inhale 2 puffs into the lungs every 6 (six) hours as needed. 1 Inhaler 3  . ALPRAZolam (XANAX) 0.5 MG tablet Take 1 tablet (0.5 mg total) by mouth 2 (two) times daily as needed for anxiety. 90 tablet 1  . Azelastine-Fluticasone 137-50 MCG/ACT SUSP Place 1 spray into the nose 2 (two) times daily. 23 g 4  . buPROPion (WELLBUTRIN XL) 150 MG 24  hr tablet Take 1 tablet (150 mg total) by mouth daily. 90 tablet 1  . cefadroxil (DURICEF) 500 MG capsule Take 1 capsule (500 mg total) by mouth 2 (two) times daily. Start this one hour before office procedure on 10-19-17 20 capsule 0  . celecoxib (CELEBREX) 100 MG capsule Take 1 capsule (100 mg total) by mouth 2 (two) times daily. 180 capsule 1  . docusate sodium (COLACE) 100 MG capsule Take 1 tablet once or twice daily as needed for constipation while taking narcotic pain medicine 30 capsule 0  . EQ ALLERGY RELIEF, CETIRIZINE, 10 MG tablet TAKE 1 TABLET BY MOUTH ONCE DAILY 90 tablet 1  . FLUoxetine (PROZAC) 20 MG capsule TAKE THREE CAPSULES BY MOUTH ONCE  DAILY 90 capsule 1  . hydrochlorothiazide (HYDRODIURIL) 12.5 MG tablet Take 1 tablet (12.5 mg total) by mouth daily. 90 tablet 3  . lidocaine-prilocaine (EMLA) cream Apply 1 application topically as needed. Apply to left areola the morning of surgery, cover with Saran wrap. 5 g 0  . Melatonin 10 MG TABS Take 1 tablet by mouth at bedtime.    . montelukast (SINGULAIR) 10 MG tablet Take 1 tablet (10 mg total) by mouth at bedtime. 30 tablet 3  . Multiple Vitamin (MULTIVITAMIN) capsule Take 1 capsule by mouth daily.    . pantoprazole (PROTONIX) 40 MG tablet Take 1 tablet (40 mg total) by mouth daily. 90 tablet 2  . promethazine (PHENERGAN) 12.5 MG tablet Take 1 tablet (12.5 mg total) by mouth every 6 (six) hours as needed for nausea or vomiting. 30 tablet 0  . rosuvastatin (CRESTOR) 20 MG tablet Take 1 tablet (20 mg total) by mouth daily. 90 tablet 3  . traMADol (ULTRAM) 50 MG tablet Take 1 tablet (50 mg total) by mouth every 6 (six) hours as needed for moderate pain. 90 tablet 1  . tretinoin (RETIN-A) 0.05 % cream Apply 1 application topically at bedtime. 45 g 0   No current facility-administered medications for this visit.     Review of Systems Review of Systems  Constitutional: Negative.   Respiratory: Negative.   Cardiovascular: Negative.     Blood pressure 124/70, pulse 82, resp. rate 16, height 5' 4"  (1.626 m), weight 204 lb (92.5 kg).  Physical Exam Physical Exam  Constitutional: She is oriented to person, place, and time. She appears well-developed and well-nourished.  Pulmonary/Chest:    Neurological: She is alert and oriented to person, place, and time.  Skin: Skin is warm and dry.  Psychiatric: Her behavior is normal.    Data Reviewed Ultrasound showed a adequate cavity for placement about 2 cm below the skin surface.  ChloraPrep was applied to the skin followed by 10 cc of 0.5% Xylocaine with 0.25% Marcaine with 1 to 200,000 units of epinephrine.  The area was reprepped  with ChloraPrep and draped.  An 8 mm trocar was placed with partial decompression of the seroma cavity.  This was manipulated to extract the majority of the fluid.  The cavity evaluation device was placed and inflated but this showed rapid approximation of the balloon to the skin surface.  Attempts to reposition [place] a deeper level resulted in a balloon 3 cm below the skin, well away from the original wide excision site which was less than 1.5 cm from the skin.  After multiple manipulations and trying various fluid volumes to the balloon, it was elected to abandon the procedure.  The skin defect was closed with benzoin and Steri-Strip followed by Telfa  and Tegaderm dressing.  Assessment    Failed MammoSite placement.    Plan    Follow up 2 weeks  I spoke with Dr. Baruch Gouty from radiation oncology.  He will arrange for the patient to come in next week to discuss whole breast radiation.     HPI, Physical Exam, Assessment and Plan have been scribed under the direction and in the presence of Robert Bellow, MD. Karie Fetch, RN I have completed the exam and reviewed the above documentation for accuracy and completeness.  I agree with the above.  Haematologist has been used and any errors in dictation or transcription are unintentional.  Hervey Ard, M.D., F.A.C.S.  Dana Obrien 10/19/2017, 9:24 AM

## 2017-10-20 ENCOUNTER — Ambulatory Visit: Payer: PPO

## 2017-10-21 ENCOUNTER — Ambulatory Visit: Payer: PPO

## 2017-10-22 ENCOUNTER — Ambulatory Visit: Payer: PPO

## 2017-10-26 ENCOUNTER — Ambulatory Visit: Payer: PPO

## 2017-10-26 ENCOUNTER — Ambulatory Visit
Admission: RE | Admit: 2017-10-26 | Discharge: 2017-10-26 | Disposition: A | Discharge status: Home / Self Care | Payer: PPO | Source: Ambulatory Visit | Attending: Radiation Oncology | Admitting: Radiation Oncology

## 2017-10-26 DIAGNOSIS — C50412 Malignant neoplasm of upper-outer quadrant of left female breast: Secondary | ICD-10-CM | POA: Diagnosis not present

## 2017-10-26 DIAGNOSIS — Z170 Estrogen receptor positive status [ER+]: Secondary | ICD-10-CM | POA: Diagnosis not present

## 2017-10-27 ENCOUNTER — Ambulatory Visit: Payer: PPO

## 2017-10-27 DIAGNOSIS — C50412 Malignant neoplasm of upper-outer quadrant of left female breast: Secondary | ICD-10-CM | POA: Diagnosis not present

## 2017-10-27 DIAGNOSIS — Z170 Estrogen receptor positive status [ER+]: Secondary | ICD-10-CM | POA: Diagnosis not present

## 2017-10-28 ENCOUNTER — Ambulatory Visit: Payer: PPO

## 2017-10-29 ENCOUNTER — Other Ambulatory Visit: Payer: Self-pay | Admitting: *Deleted

## 2017-10-29 DIAGNOSIS — C50412 Malignant neoplasm of upper-outer quadrant of left female breast: Secondary | ICD-10-CM

## 2017-10-29 DIAGNOSIS — Z170 Estrogen receptor positive status [ER+]: Principal | ICD-10-CM

## 2017-11-02 ENCOUNTER — Ambulatory Visit (INDEPENDENT_AMBULATORY_CARE_PROVIDER_SITE_OTHER): Payer: PPO | Admitting: General Surgery

## 2017-11-02 ENCOUNTER — Ambulatory Visit
Admission: RE | Admit: 2017-11-02 | Discharge: 2017-11-02 | Disposition: A | Discharge status: Home / Self Care | Payer: PPO | Source: Ambulatory Visit | Attending: Radiation Oncology | Admitting: Radiation Oncology

## 2017-11-02 ENCOUNTER — Encounter: Payer: Self-pay | Admitting: General Surgery

## 2017-11-02 ENCOUNTER — Ambulatory Visit: Payer: PPO

## 2017-11-02 ENCOUNTER — Other Ambulatory Visit: Payer: Self-pay | Admitting: Family Medicine

## 2017-11-02 VITALS — BP 144/82 | HR 78 | Resp 14 | Ht 64.0 in | Wt 203.0 lb

## 2017-11-02 DIAGNOSIS — Z79899 Other long term (current) drug therapy: Secondary | ICD-10-CM | POA: Insufficient documentation

## 2017-11-02 DIAGNOSIS — C50412 Malignant neoplasm of upper-outer quadrant of left female breast: Secondary | ICD-10-CM

## 2017-11-02 DIAGNOSIS — K219 Gastro-esophageal reflux disease without esophagitis: Secondary | ICD-10-CM | POA: Diagnosis not present

## 2017-11-02 DIAGNOSIS — Z170 Estrogen receptor positive status [ER+]: Secondary | ICD-10-CM | POA: Insufficient documentation

## 2017-11-02 DIAGNOSIS — Z9221 Personal history of antineoplastic chemotherapy: Secondary | ICD-10-CM | POA: Diagnosis not present

## 2017-11-02 DIAGNOSIS — I10 Essential (primary) hypertension: Secondary | ICD-10-CM | POA: Insufficient documentation

## 2017-11-02 DIAGNOSIS — G4733 Obstructive sleep apnea (adult) (pediatric): Secondary | ICD-10-CM | POA: Diagnosis not present

## 2017-11-02 DIAGNOSIS — F419 Anxiety disorder, unspecified: Secondary | ICD-10-CM | POA: Diagnosis not present

## 2017-11-02 DIAGNOSIS — Z923 Personal history of irradiation: Secondary | ICD-10-CM | POA: Diagnosis not present

## 2017-11-02 DIAGNOSIS — F329 Major depressive disorder, single episode, unspecified: Secondary | ICD-10-CM | POA: Diagnosis not present

## 2017-11-02 DIAGNOSIS — J45909 Unspecified asthma, uncomplicated: Secondary | ICD-10-CM | POA: Insufficient documentation

## 2017-11-02 DIAGNOSIS — Z510 Encounter for antineoplastic radiation therapy: Secondary | ICD-10-CM | POA: Diagnosis not present

## 2017-11-02 NOTE — Telephone Encounter (Signed)
Last OV 08/24/17 last filled 04/30/17 90 1rf

## 2017-11-02 NOTE — Progress Notes (Signed)
Patient ID: Dana Obrien, female   DOB: 1946/01/24, 72 y.o.   MRN: [id number]  Chief Complaint  Patient presents with  . Breast Cancer Long Term Follow Up    HPI Dana Obrien is a 72 y.o. female  Here for a follow up from left lumpectomy done on 09/17/17. She is going to starting whole breast radiation treatments tomorrow. She reports that her left nipple is still sore.  HPI  Past Medical History:  Diagnosis Date  . Anxiety   . Arthritis   . Asthma   . Breast cancer ([place]) 2004   right breast  . Breast cancer of upper-outer quadrant of left female breast ([place]) 09/17/2017   T1c, N0, ER 90%, PR 90%, 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  . Cancer [facility]) 2004   right lumpectomy, chemotherapy and radiation Dr. Jeb Levering  . Depression   . GERD (gastroesophageal reflux disease)   . Hypertension   . Migraine   . Personal history of chemotherapy   . Personal history of radiation therapy   . PONV (postoperative nausea and vomiting)   . Shingles   . Sleep apnea    uses CPAP, severe OSA  . Vitamin D deficiency    IN THE PAST    Past Surgical History:  Procedure Laterality Date  . ABDOMINAL HYSTERECTOMY    . ANTERIOR AND POSTERIOR REPAIR    . BREAST BIOPSY Right    stereo prior to lumpectomy  . BREAST BIOPSY Left 09/07/2017   US guided biopsy  . 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  3.5 cm maximum diameter, minimal margins 5 mm.  In situ component less than 10%.  Marland Kitchen BREAST LUMPECTOMY Right 2004  . BREAST LUMPECTOMY Left 09/17/2017   Procedure: BREAST EXCISION, SENTINEL NODE BIOPSY;  Surgeon: Robert Bellow, MD;  Location: [facility] ORS;  Service: General;  Laterality: Left;  . ETHMOIDECTOMY Bilateral 11/05/2016   Procedure: ETHMOIDECTOMY;  Surgeon: Melida Quitter, MD;  Location: [place];  Service: ENT;  Laterality: Bilateral;  . MAXILLARY ANTROSTOMY Bilateral 11/05/2016   Procedure: MAXILLARY ANTROSTOMY;  Surgeon: Melida Quitter, MD;  Location: [place];  Service: ENT;  Laterality: Bilateral;  . SINUS ENDO W/FUSION Bilateral 11/05/2016   Procedure: FRONTAL RECESS EXPLORATION;  Surgeon: Melida Quitter, MD;  Location: [place];  Service: ENT;  Laterality: Bilateral;  BILATERAL ENDOSCOPIC SINUS SURGERY WITH FUSION  . SINUS EXPLORATION  11/05/2016  . SPHENOIDECTOMY Bilateral 11/05/2016   Procedure: SPHENOIDECTOMY;  Surgeon: Melida Quitter, MD;  Location: [facility] OR;  Service: ENT;  Laterality: Bilateral;    Family History  Problem Relation Age of Onset  . Breast cancer Neg Hx     Social History Social History   Tobacco Use  . Smoking status: Never Smoker  . Smokeless tobacco: Never Used  Substance Use Topics  . Alcohol use: Yes    Comment: rarely  . Drug use: No    Allergies  Allergen Reactions  . Lidocaine Palpitations  . Codeine Nausea Only and Nausea And Vomiting  . Morphine Nausea Only  . Niacin Rash  . Niacin And Related Rash    ANTIHYPERLIPEMICS    Current Outpatient Medications  Medication Sig Dispense Refill  . albuterol (PROVENTIL HFA;VENTOLIN HFA) 108 (90 Base) MCG/ACT inhaler Inhale 2 puffs into the lungs every 6 (six) hours as needed. 1 Inhaler 3  . ALPRAZolam (XANAX) 0.5 MG tablet Take 1 tablet (0.5 mg total) by mouth 2 (two) times daily as needed  for anxiety. 90 tablet 1  . Azelastine-Fluticasone 137-50 MCG/ACT SUSP Place 1 spray into the nose 2 (two) times daily. 23 g 4  . buPROPion (WELLBUTRIN XL) 150 MG 24 hr tablet Take 1 tablet (150 mg total) by mouth daily. 90 tablet 1  . celecoxib (CELEBREX) 100 MG capsule Take 1 capsule (100 mg total) by mouth 2 (two) times daily. 180 capsule 1  . docusate sodium (COLACE) 100 MG capsule Take 1 tablet once or twice daily as needed for constipation while taking narcotic pain medicine 30 capsule 0  . EQ ALLERGY RELIEF, CETIRIZINE, 10 MG tablet TAKE 1 TABLET BY MOUTH ONCE DAILY 90 tablet 1  . FLUoxetine (PROZAC) 20 MG capsule TAKE THREE CAPSULES BY MOUTH ONCE  DAILY 90 capsule 1  . hydrochlorothiazide (HYDRODIURIL) 12.5 MG tablet Take 1 tablet (12.5 mg total) by mouth daily. 90 tablet 3  . lidocaine-prilocaine (EMLA) cream Apply 1 application topically as needed. Apply to left areola the morning of surgery, cover with Saran wrap. 5 g 0  . Melatonin 10 MG TABS Take 1 tablet by mouth at bedtime.    . montelukast (SINGULAIR) 10 MG tablet Take 1 tablet (10 mg total) by mouth at bedtime. 30 tablet 3  . Multiple Vitamin (MULTIVITAMIN) capsule Take 1 capsule by mouth daily.    . pantoprazole (PROTONIX) 40 MG tablet Take 1 tablet (40 mg total) by mouth daily. 90 tablet 2  . promethazine (PHENERGAN) 12.5 MG tablet Take 1 tablet (12.5 mg total) by mouth every 6 (six) hours as needed for nausea or vomiting. 30 tablet 0  . rosuvastatin (CRESTOR) 20 MG tablet Take 1 tablet (20 mg total) by mouth daily. 90 tablet 3  . tretinoin (RETIN-A) 0.05 % cream Apply 1 application topically at bedtime. 45 g 0  . traMADol (ULTRAM) 50 MG tablet TAKE 1 TABLET BY MOUTH EVERY 6 HOURS AS NEEDED FOR MODERATE PAIN 90 tablet 1   No current facility-administered medications for this visit.     Review of Systems Review of Systems  Constitutional: Negative.   Respiratory: Negative.   Cardiovascular: Negative.     Blood pressure (!) 144/82, pulse 78, resp. rate 14, height 5' 4" (1.626 m), weight 203 lb (92.1 kg).  Physical Exam Physical Exam  Constitutional: She appears well-developed and well-nourished.  Pulmonary/Chest: Left breast exhibits tenderness (left nipple).    Skin: Skin is warm and dry.  Psychiatric: She has a normal mood and affect.       Assessment    Doing well post wide excision attempted MammoSite placement. Scheduled to begin  whole breast radiation tomorrow.    Plan Follow up in 2 months. May use heat to left nipple.   The patient will be a candidate for antiestrogen therapy when she completes her radiation.  HPI, Physical Exam, Assessment and  Plan have been scribed under the direction and in the presence of Robert Bellow, MD  Concepcion Living, LPN   I have completed the exam and reviewed the above documentation for accuracy and completeness.  I agree with the above.  Haematologist has been used and any errors in dictation or transcription are unintentional.  Hervey Ard, M.D., F.A.C.S.    Forest Gleason Arlesia Kiel 11/02/2017, 8:13 PM

## 2017-11-02 NOTE — Patient Instructions (Signed)
May use heat to left nipple to help with pain. May go with out your bra as needed. Follow up in 2 months.

## 2017-11-03 ENCOUNTER — Ambulatory Visit
Admission: RE | Admit: 2017-11-03 | Discharge: 2017-11-03 | Disposition: A | Discharge status: Home / Self Care | Payer: PPO | Source: Ambulatory Visit | Attending: Radiation Oncology | Admitting: Radiation Oncology

## 2017-11-03 DIAGNOSIS — Z170 Estrogen receptor positive status [ER+]: Secondary | ICD-10-CM | POA: Diagnosis not present

## 2017-11-03 DIAGNOSIS — C50412 Malignant neoplasm of upper-outer quadrant of left female breast: Secondary | ICD-10-CM | POA: Diagnosis not present

## 2017-11-03 DIAGNOSIS — Z510 Encounter for antineoplastic radiation therapy: Secondary | ICD-10-CM | POA: Diagnosis not present

## 2017-11-04 ENCOUNTER — Ambulatory Visit: Payer: PPO

## 2017-11-04 ENCOUNTER — Other Ambulatory Visit: Payer: PPO

## 2017-11-04 ENCOUNTER — Ambulatory Visit
Admission: RE | Admit: 2017-11-04 | Discharge: 2017-11-04 | Disposition: A | Discharge status: Home / Self Care | Payer: PPO | Source: Ambulatory Visit | Attending: Radiation Oncology | Admitting: Radiation Oncology

## 2017-11-04 DIAGNOSIS — C50412 Malignant neoplasm of upper-outer quadrant of left female breast: Secondary | ICD-10-CM | POA: Diagnosis not present

## 2017-11-04 DIAGNOSIS — Z510 Encounter for antineoplastic radiation therapy: Secondary | ICD-10-CM | POA: Diagnosis not present

## 2017-11-04 DIAGNOSIS — Z170 Estrogen receptor positive status [ER+]: Secondary | ICD-10-CM | POA: Diagnosis not present

## 2017-11-05 ENCOUNTER — Ambulatory Visit: Payer: PPO

## 2017-11-05 ENCOUNTER — Ambulatory Visit
Admission: RE | Admit: 2017-11-05 | Discharge: 2017-11-05 | Disposition: A | Discharge status: Home / Self Care | Payer: PPO | Source: Ambulatory Visit | Attending: Radiation Oncology | Admitting: Radiation Oncology

## 2017-11-05 DIAGNOSIS — Z170 Estrogen receptor positive status [ER+]: Secondary | ICD-10-CM | POA: Diagnosis not present

## 2017-11-05 DIAGNOSIS — Z510 Encounter for antineoplastic radiation therapy: Secondary | ICD-10-CM | POA: Diagnosis not present

## 2017-11-05 DIAGNOSIS — C50412 Malignant neoplasm of upper-outer quadrant of left female breast: Secondary | ICD-10-CM | POA: Diagnosis not present

## 2017-11-08 ENCOUNTER — Ambulatory Visit: Payer: PPO

## 2017-11-08 ENCOUNTER — Ambulatory Visit
Admission: RE | Admit: 2017-11-08 | Discharge: 2017-11-08 | Disposition: A | Discharge status: Home / Self Care | Payer: PPO | Source: Ambulatory Visit | Attending: Radiation Oncology | Admitting: Radiation Oncology

## 2017-11-08 DIAGNOSIS — Z170 Estrogen receptor positive status [ER+]: Secondary | ICD-10-CM | POA: Diagnosis not present

## 2017-11-08 DIAGNOSIS — Z510 Encounter for antineoplastic radiation therapy: Secondary | ICD-10-CM | POA: Diagnosis not present

## 2017-11-08 DIAGNOSIS — C50412 Malignant neoplasm of upper-outer quadrant of left female breast: Secondary | ICD-10-CM | POA: Diagnosis not present

## 2017-11-09 ENCOUNTER — Ambulatory Visit: Payer: PPO

## 2017-11-09 ENCOUNTER — Ambulatory Visit
Admission: RE | Admit: 2017-11-09 | Discharge: 2017-11-09 | Disposition: A | Discharge status: Home / Self Care | Payer: PPO | Source: Ambulatory Visit | Attending: Radiation Oncology | Admitting: Radiation Oncology

## 2017-11-09 DIAGNOSIS — Z510 Encounter for antineoplastic radiation therapy: Secondary | ICD-10-CM | POA: Diagnosis not present

## 2017-11-09 DIAGNOSIS — C50412 Malignant neoplasm of upper-outer quadrant of left female breast: Secondary | ICD-10-CM | POA: Diagnosis not present

## 2017-11-09 DIAGNOSIS — Z170 Estrogen receptor positive status [ER+]: Secondary | ICD-10-CM | POA: Diagnosis not present

## 2017-11-10 ENCOUNTER — Ambulatory Visit
Admission: RE | Admit: 2017-11-10 | Discharge: 2017-11-10 | Disposition: A | Discharge status: Home / Self Care | Payer: PPO | Source: Ambulatory Visit | Attending: Radiation Oncology | Admitting: Radiation Oncology

## 2017-11-10 ENCOUNTER — Ambulatory Visit: Payer: PPO

## 2017-11-10 DIAGNOSIS — C50412 Malignant neoplasm of upper-outer quadrant of left female breast: Secondary | ICD-10-CM | POA: Diagnosis not present

## 2017-11-10 DIAGNOSIS — Z510 Encounter for antineoplastic radiation therapy: Secondary | ICD-10-CM | POA: Diagnosis not present

## 2017-11-10 DIAGNOSIS — Z170 Estrogen receptor positive status [ER+]: Secondary | ICD-10-CM | POA: Diagnosis not present

## 2017-11-11 ENCOUNTER — Ambulatory Visit
Admission: RE | Admit: 2017-11-11 | Discharge: 2017-11-11 | Disposition: A | Discharge status: Home / Self Care | Payer: PPO | Source: Ambulatory Visit | Attending: Radiation Oncology | Admitting: Radiation Oncology

## 2017-11-11 ENCOUNTER — Ambulatory Visit: Payer: PPO

## 2017-11-11 DIAGNOSIS — Z170 Estrogen receptor positive status [ER+]: Secondary | ICD-10-CM | POA: Diagnosis not present

## 2017-11-11 DIAGNOSIS — C50412 Malignant neoplasm of upper-outer quadrant of left female breast: Secondary | ICD-10-CM | POA: Diagnosis not present

## 2017-11-11 DIAGNOSIS — Z510 Encounter for antineoplastic radiation therapy: Secondary | ICD-10-CM | POA: Diagnosis not present

## 2017-11-12 ENCOUNTER — Ambulatory Visit
Admission: RE | Admit: 2017-11-12 | Discharge: 2017-11-12 | Disposition: A | Discharge status: Home / Self Care | Payer: PPO | Source: Ambulatory Visit | Attending: Radiation Oncology | Admitting: Radiation Oncology

## 2017-11-12 ENCOUNTER — Ambulatory Visit: Payer: PPO

## 2017-11-12 DIAGNOSIS — Z170 Estrogen receptor positive status [ER+]: Secondary | ICD-10-CM | POA: Diagnosis not present

## 2017-11-12 DIAGNOSIS — Z510 Encounter for antineoplastic radiation therapy: Secondary | ICD-10-CM | POA: Diagnosis not present

## 2017-11-12 DIAGNOSIS — C50412 Malignant neoplasm of upper-outer quadrant of left female breast: Secondary | ICD-10-CM | POA: Diagnosis not present

## 2017-11-15 ENCOUNTER — Ambulatory Visit: Payer: PPO

## 2017-11-15 ENCOUNTER — Ambulatory Visit
Admission: RE | Admit: 2017-11-15 | Discharge: 2017-11-15 | Disposition: A | Discharge status: Home / Self Care | Payer: PPO | Source: Ambulatory Visit | Attending: Radiation Oncology | Admitting: Radiation Oncology

## 2017-11-15 DIAGNOSIS — C50412 Malignant neoplasm of upper-outer quadrant of left female breast: Secondary | ICD-10-CM | POA: Diagnosis not present

## 2017-11-15 DIAGNOSIS — Z510 Encounter for antineoplastic radiation therapy: Secondary | ICD-10-CM | POA: Diagnosis not present

## 2017-11-15 DIAGNOSIS — Z170 Estrogen receptor positive status [ER+]: Secondary | ICD-10-CM | POA: Diagnosis not present

## 2017-11-16 ENCOUNTER — Ambulatory Visit
Admission: RE | Admit: 2017-11-16 | Discharge: 2017-11-16 | Disposition: A | Discharge status: Home / Self Care | Payer: PPO | Source: Ambulatory Visit | Attending: Radiation Oncology | Admitting: Radiation Oncology

## 2017-11-16 ENCOUNTER — Ambulatory Visit: Payer: PPO

## 2017-11-16 DIAGNOSIS — Z170 Estrogen receptor positive status [ER+]: Secondary | ICD-10-CM | POA: Diagnosis not present

## 2017-11-16 DIAGNOSIS — Z510 Encounter for antineoplastic radiation therapy: Secondary | ICD-10-CM | POA: Diagnosis not present

## 2017-11-16 DIAGNOSIS — C50412 Malignant neoplasm of upper-outer quadrant of left female breast: Secondary | ICD-10-CM | POA: Diagnosis not present

## 2017-11-17 ENCOUNTER — Ambulatory Visit: Payer: PPO

## 2017-11-17 ENCOUNTER — Ambulatory Visit
Admission: RE | Admit: 2017-11-17 | Discharge: 2017-11-17 | Disposition: A | Discharge status: Home / Self Care | Payer: PPO | Source: Ambulatory Visit | Attending: Radiation Oncology | Admitting: Radiation Oncology

## 2017-11-17 DIAGNOSIS — C50412 Malignant neoplasm of upper-outer quadrant of left female breast: Secondary | ICD-10-CM | POA: Diagnosis not present

## 2017-11-17 DIAGNOSIS — Z510 Encounter for antineoplastic radiation therapy: Secondary | ICD-10-CM | POA: Diagnosis not present

## 2017-11-17 DIAGNOSIS — Z170 Estrogen receptor positive status [ER+]: Secondary | ICD-10-CM | POA: Diagnosis not present

## 2017-11-18 ENCOUNTER — Inpatient Hospital Stay: Payer: PPO | Attending: Oncology | Admitting: Oncology

## 2017-11-18 ENCOUNTER — Ambulatory Visit
Admission: RE | Admit: 2017-11-18 | Discharge: 2017-11-18 | Disposition: A | Discharge status: Home / Self Care | Payer: PPO | Source: Ambulatory Visit | Attending: Radiation Oncology | Admitting: Radiation Oncology

## 2017-11-18 ENCOUNTER — Other Ambulatory Visit: Payer: Self-pay

## 2017-11-18 ENCOUNTER — Other Ambulatory Visit: Payer: Self-pay | Admitting: *Deleted

## 2017-11-18 ENCOUNTER — Inpatient Hospital Stay: Payer: PPO

## 2017-11-18 ENCOUNTER — Ambulatory Visit: Payer: PPO

## 2017-11-18 ENCOUNTER — Encounter: Payer: Self-pay | Admitting: Oncology

## 2017-11-18 VITALS — BP 119/77 | HR 81 | Temp 81.0°F | Resp 18 | Ht 64.0 in | Wt 204.3 lb

## 2017-11-18 DIAGNOSIS — Z923 Personal history of irradiation: Secondary | ICD-10-CM

## 2017-11-18 DIAGNOSIS — Z853 Personal history of malignant neoplasm of breast: Secondary | ICD-10-CM

## 2017-11-18 DIAGNOSIS — Z79811 Long term (current) use of aromatase inhibitors: Secondary | ICD-10-CM | POA: Diagnosis not present

## 2017-11-18 DIAGNOSIS — Z170 Estrogen receptor positive status [ER+]: Secondary | ICD-10-CM

## 2017-11-18 DIAGNOSIS — C50412 Malignant neoplasm of upper-outer quadrant of left female breast: Secondary | ICD-10-CM

## 2017-11-18 DIAGNOSIS — Z510 Encounter for antineoplastic radiation therapy: Secondary | ICD-10-CM | POA: Diagnosis not present

## 2017-11-18 LAB — CBC WITH DIFFERENTIAL/PLATELET
BASOS ABS: 0.1 10*3/uL (ref 0–0.1)
Basophils Relative: 1 %
EOS PCT: 6 %
Eosinophils Absolute: 0.4 10*3/uL (ref 0–0.7)
HCT: 35.5 % (ref 35.0–47.0)
Hemoglobin: 12.2 g/dL (ref 12.0–16.0)
LYMPHS ABS: 1.3 10*3/uL (ref 1.0–3.6)
LYMPHS PCT: 19 %
MCH: 31.8 pg (ref 26.0–34.0)
MCHC: 34.5 g/dL (ref 32.0–36.0)
MCV: 92.3 fL (ref 80.0–100.0)
Monocytes Absolute: 0.8 10*3/uL (ref 0.2–0.9)
Monocytes Relative: 11 %
Neutro Abs: 4.3 10*3/uL (ref 1.4–6.5)
Neutrophils Relative %: 63 %
PLATELETS: 220 10*3/uL (ref 150–440)
RBC: 3.84 MIL/uL (ref 3.80–5.20)
RDW: 14.3 % (ref 11.5–14.5)
WBC: 6.9 10*3/uL (ref 3.6–11.0)

## 2017-11-18 LAB — COMPREHENSIVE METABOLIC PANEL
ALBUMIN: 4 g/dL (ref 3.5–5.0)
ALT: 28 U/L (ref 14–54)
AST: 29 U/L (ref 15–41)
Alkaline Phosphatase: 67 U/L (ref 38–126)
Anion gap: 8 (ref 5–15)
BILIRUBIN TOTAL: 0.5 mg/dL (ref 0.3–1.2)
BUN: 18 mg/dL (ref 6–20)
CO2: 27 mmol/L (ref 22–32)
CREATININE: 0.74 mg/dL (ref 0.44–1.00)
Calcium: 9.5 mg/dL (ref 8.9–10.3)
Chloride: 102 mmol/L (ref 101–111)
GFR calc Af Amer: >60 mL/min (ref >60)
GFR calc non Af Amer: >60 mL/min (ref >60)
GLUCOSE: 114 mg/dL — AB (ref 65–99)
POTASSIUM: 4.4 mmol/L (ref 3.5–5.1)
Sodium: 137 mmol/L (ref 135–145)
TOTAL PROTEIN: 7.4 g/dL (ref 6.5–8.1)

## 2017-11-18 MED ORDER — EXEMESTANE 25 MG PO TABS: 25.0000 mg | ORAL_TABLET | Freq: Every day | ORAL | 1 refills | Status: DC

## 2017-11-18 NOTE — Progress Notes (Signed)
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 T2N0M0.She thenn completed 5 years of aromasin until 2009.She could not tolerate arimidex.  Recent screening mammogram on 08/25/2017 showed a possible mass in her left breast. This was followed by an ultrasound and a diagnostic left mammogram which showed a 0.7 x 0.5 x 0.7 cm mass in the left breast. No evidence of left axillary adenopathy.  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 ER greater than 90% positive, PR greater than 90% positive and HER-2/neu negative   Patient has been seen by Dr. Camelia Phenes is scheduled to undergo surgery on 09/17/2017  She is G2P2L2. Menarche at the age of 72. Menopause in her early 90's. She used to take prempro from [age] yrs but none since then. She does have chronic back pain and tingling/pain in her extremities. Cervical spine MRI showed egenrative disease  Final pathology showed: Invasive mammary carcinoma 13 mm, grade 1 with associated DCIS.  Negative margins.  0 out of 3 lymph nodes were positive for malignancy. 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  Interval history-patient is currently in the middle of her radiation treatment which she will finish during the week of July 4.  She has some redness around the radiation site.  Denies any fever.  She does report some ongoing fatigue during radiation  ECOG PS- 0 Pain scale- 0   Review of systems- Review of Systems  Constitutional: Positive for malaise/fatigue. Negative for chills, fever and weight loss.  HENT: Negative for congestion, ear discharge and nosebleeds.   Eyes: Negative for blurred vision.  Respiratory: Negative for cough, hemoptysis, sputum production, shortness of breath and wheezing.   Cardiovascular: Negative for chest pain, palpitations, orthopnea and claudication.  Gastrointestinal: Negative for abdominal pain, blood in stool, constipation, diarrhea, heartburn, melena, nausea and vomiting.  Genitourinary: Negative for dysuria, flank pain, frequency, hematuria and urgency.  Musculoskeletal: Negative for back pain, joint pain and myalgias.  Skin: Negative for rash.  Neurological: Negative for dizziness, tingling, focal weakness, seizures, weakness and headaches.  Endo/Heme/Allergies: Does not bruise/bleed easily.  Psychiatric/Behavioral: Negative for depression and suicidal ideas. The patient does not have insomnia.       Allergies  Allergen Reactions  . Lidocaine Palpitations  . Codeine Nausea Only and Nausea And Vomiting  . Morphine Nausea Only  . Niacin Rash  . Niacin And Related Rash    ANTIHYPERLIPEMICS     Past Medical History:  Diagnosis Date  . Anxiety   . Arthritis   . Asthma   .  Breast cancer ([place]) 2004   right breast  . Breast cancer of upper-outer quadrant of left female breast ([place]) 09/17/2017   T1c, N0, ER 90%, PR 90%, 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    . Cancer [facility]) 2004   right lumpectomy, chemotherapy and radiation Dr. Jeb Levering  . Depression   . GERD (gastroesophageal reflux disease)   . Hypertension   . Migraine   . Personal history of chemotherapy   . Personal history of radiation therapy   . PONV (postoperative nausea and vomiting)   . Shingles   . Sleep apnea    uses CPAP, severe OSA  . Vitamin D deficiency    IN THE PAST     Past Surgical History:  Procedure Laterality Date  . ABDOMINAL HYSTERECTOMY    . ANTERIOR AND POSTERIOR REPAIR    . BREAST BIOPSY Right    stereo prior to lumpectomy  . BREAST BIOPSY Left 09/07/2017   US guided biopsy  . 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  3.5 cm maximum diameter, minimal margins 5 mm.  In situ component less than 10%.  Marland Kitchen BREAST LUMPECTOMY Right 2004  . BREAST LUMPECTOMY Left 09/17/2017   Procedure: BREAST EXCISION, SENTINEL NODE BIOPSY;  Surgeon: Robert Bellow, MD;  Location: [facility] ORS;  Service: General;  Laterality: Left;  . ETHMOIDECTOMY Bilateral 11/05/2016   Procedure: ETHMOIDECTOMY;  Surgeon: Melida Quitter, MD;  Location: [place];  Service: ENT;  Laterality: Bilateral;  . MAXILLARY ANTROSTOMY Bilateral 11/05/2016   Procedure: MAXILLARY ANTROSTOMY;  Surgeon: Melida Quitter, MD;  Location: [place];  Service: ENT;  Laterality: Bilateral;  . SINUS ENDO W/FUSION Bilateral 11/05/2016   Procedure: FRONTAL RECESS EXPLORATION;  Surgeon: Melida Quitter, MD;  Location: [place];  Service: ENT;  Laterality: Bilateral;  BILATERAL ENDOSCOPIC SINUS SURGERY WITH FUSION  . SINUS EXPLORATION  11/05/2016  . SPHENOIDECTOMY Bilateral 11/05/2016   Procedure: SPHENOIDECTOMY;  Surgeon: Melida Quitter, MD;  Location: [facility] OR;  Service: ENT;  Laterality: Bilateral;    Social History   Socioeconomic History  . Marital status: Widowed    Spouse name: Not on file  . Number of children: Not on file  . Years of education: Not on file  .  Highest education level: Not on file  Occupational History  . Not on file  Social Needs  . Financial resource strain: Not on file  . Food insecurity:    Worry: Not on file    Inability: Not on file  . Transportation needs:    Medical: Not on file    Non-medical: Not on file  Tobacco Use  . Smoking status: Never Smoker  . Smokeless tobacco: Never Used  Substance and Sexual Activity  . Alcohol use: Yes    Comment: rarely  . Drug use: No  . Sexual activity: Not on file  Lifestyle  . Physical activity:    Days per week: Not on file    Minutes per session: Not on file  . Stress: Not on file  Relationships  . Social connections:    Talks on phone: Not on file    Gets together: Not on file    Attends religious service: Not on file    Active member of club or organization: Not on file    Attends meetings of clubs or organizations: Not on file    Relationship status: Not on file  . Intimate partner violence:    Fear of current or ex partner:  Not on file    Emotionally abused: Not on file    Physically abused: Not on file    Forced sexual activity: Not on file  Other Topics Concern  . Not on file  Social History Narrative   Daily Caffeine Use:  1-2 coffee   Regular Exercise -  NO    Family History  Problem Relation Age of Onset  . Breast cancer Neg Hx      Current Outpatient Medications:  .  Azelastine-Fluticasone 137-50 MCG/ACT SUSP, Place 1 spray into the nose 2 (two) times daily., Disp: 23 g, Rfl: 4 .  buPROPion (WELLBUTRIN XL) 150 MG 24 hr tablet, Take 1 tablet (150 mg total) by mouth daily., Disp: 90 tablet, Rfl: 1 .  celecoxib (CELEBREX) 100 MG capsule, Take 1 capsule (100 mg total) by mouth 2 (two) times daily., Disp: 180 capsule, Rfl: 1 .  docusate sodium (COLACE) 100 MG capsule, Take 1 tablet once or twice daily as needed for constipation while taking narcotic pain medicine, Disp: 30 capsule, Rfl: 0 .  EQ ALLERGY RELIEF, CETIRIZINE, 10 MG tablet, TAKE 1 TABLET BY  MOUTH ONCE DAILY, Disp: 90 tablet, Rfl: 1 .  FLUoxetine (PROZAC) 20 MG capsule, TAKE THREE CAPSULES BY MOUTH ONCE DAILY, Disp: 90 capsule, Rfl: 1 .  hydrochlorothiazide (HYDRODIURIL) 12.5 MG tablet, Take 1 tablet (12.5 mg total) by mouth daily., Disp: 90 tablet, Rfl: 3 .  Melatonin 10 MG TABS, Take 1 tablet by mouth at bedtime., Disp: , Rfl:  .  montelukast (SINGULAIR) 10 MG tablet, Take 1 tablet (10 mg total) by mouth at bedtime., Disp: 30 tablet, Rfl: 3 .  Multiple Vitamin (MULTIVITAMIN) capsule, Take 1 capsule by mouth daily., Disp: , Rfl:  .  pantoprazole (PROTONIX) 40 MG tablet, Take 1 tablet (40 mg total) by mouth daily., Disp: 90 tablet, Rfl: 2 .  rosuvastatin (CRESTOR) 20 MG tablet, Take 1 tablet (20 mg total) by mouth daily., Disp: 90 tablet, Rfl: 3 .  traMADol (ULTRAM) 50 MG tablet, TAKE 1 TABLET BY MOUTH EVERY 6 HOURS AS NEEDED FOR MODERATE PAIN, Disp: 90 tablet, Rfl: 1 .  albuterol (PROVENTIL HFA;VENTOLIN HFA) 108 (90 Base) MCG/ACT inhaler, Inhale 2 puffs into the lungs every 6 (six) hours as needed. (Patient not taking: Reported on 11/18/2017), Disp: 1 Inhaler, Rfl: 3 .  ALPRAZolam (XANAX) 0.5 MG tablet, Take 1 tablet (0.5 mg total) by mouth 2 (two) times daily as needed for anxiety. (Patient not taking: Reported on 11/18/2017), Disp: 90 tablet, Rfl: 1 .  exemestane (AROMASIN) 25 MG tablet, Take 1 tablet (25 mg total) by mouth daily after breakfast., Disp: 30 tablet, Rfl: 1 .  lidocaine-prilocaine (EMLA) cream, Apply 1 application topically as needed. Apply to left areola the morning of surgery, cover with Saran wrap. (Patient not taking: Reported on 11/18/2017), Disp: 5 g, Rfl: 0 .  promethazine (PHENERGAN) 12.5 MG tablet, Take 1 tablet (12.5 mg total) by mouth every 6 (six) hours as needed for nausea or vomiting. (Patient not taking: Reported on 11/18/2017), Disp: 30 tablet, Rfl: 0 .  tretinoin (RETIN-A) 0.05 % cream, Apply 1 application topically at bedtime. (Patient not taking: Reported on  11/18/2017), Disp: 45 g, Rfl: 0  Physical exam:  Vitals:   11/18/17 1013  BP: 119/77  Pulse: 81  Resp: 18  Temp: (!) 81 F (27.2 C)  TempSrc: Tympanic  SpO2: 95%  Weight: 204 lb 4.8 oz (92.7 kg)  Height: 5' 4"  (1.626 m)   Physical Exam  Constitutional: She is oriented to person, place, and time. She appears well-developed and well-nourished.  HENT:  Head: Normocephalic and atraumatic.  Eyes: Pupils are equal, round, and reactive to light. EOM are normal.  Neck: Normal range of motion.  Cardiovascular: Normal rate, regular rhythm and normal heart sounds.  Pulmonary/Chest: Effort normal and breath sounds normal.  Abdominal: Soft. Bowel sounds are normal.  Neurological: She is alert and oriented to person, place, and time.  Skin: Skin is warm and dry.  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  CMP Latest Ref Rng & Units 11/18/2017  Glucose 65 - 99 mg/dL 114(H)  BUN 6 - 20 mg/dL 18  Creatinine 0.44 - 1.00 mg/dL 0.74  Sodium 135 - 145 mmol/L 137  Potassium 3.5 - 5.1 mmol/L 4.4  Chloride 101 - 111 mmol/L 102  CO2 22 - 32 mmol/L 27  Calcium 8.9 - 10.3 mg/dL 9.5  Total Protein 6.5 - 8.1 g/dL 7.4  Total Bilirubin 0.3 - 1.2 mg/dL 0.5  Alkaline Phos 38 - 126 U/L 67  AST 15 - 41 U/L 29  ALT 14 - 54 U/L 28   CBC Latest Ref Rng & Units 11/18/2017  WBC 3.6 - 11.0 K/uL 6.9  Hemoglobin 12.0 - 16.0 g/dL 12.2  Hematocrit 35.0 - 47.0 % 35.5  Platelets 150 - 440 K/uL 220     Assessment and plan- Patient is a 72 y.o. female with invasive mammary carcinoma of the left breast stage IA pT1c pN0 cM0 ER PR positive and HER-2/neu negative status post lumpectomy and ongoing radiation treatment  We will go ahead and prescribe her Aromasin at this time to her pharmacy which she will start taking upon completion of radiation treatment.  She will also get a bone density scan before her next visit.  Patient did have problems with severe arthralgia  is on Arimidex and is therefore being prescribed Aromasin which she has taken in the past.  She is also continuing to take her calcium and vitamin D supplements.  I will see her back in early September with CMP to assess how she is tolerating her Aromasin and also discussed the results of her bone density scan at that time     Visit Diagnosis 1. Malignant neoplasm of upper-outer quadrant of left breast in female, estrogen receptor positive ([place])      Dr. Randa Evens, MD, MPH [facility] [phone] 11/18/2017 11:15 AM

## 2017-11-18 NOTE — Progress Notes (Signed)
No new change noted today 

## 2017-11-19 ENCOUNTER — Ambulatory Visit: Payer: PPO

## 2017-11-19 ENCOUNTER — Ambulatory Visit
Admission: RE | Admit: 2017-11-19 | Discharge: 2017-11-19 | Disposition: A | Discharge status: Home / Self Care | Payer: PPO | Source: Ambulatory Visit | Attending: Radiation Oncology | Admitting: Radiation Oncology

## 2017-11-19 DIAGNOSIS — Z170 Estrogen receptor positive status [ER+]: Secondary | ICD-10-CM | POA: Diagnosis not present

## 2017-11-19 DIAGNOSIS — C50412 Malignant neoplasm of upper-outer quadrant of left female breast: Secondary | ICD-10-CM | POA: Diagnosis not present

## 2017-11-19 DIAGNOSIS — Z510 Encounter for antineoplastic radiation therapy: Secondary | ICD-10-CM | POA: Diagnosis not present

## 2017-11-22 ENCOUNTER — Ambulatory Visit: Payer: PPO

## 2017-11-22 DIAGNOSIS — Z510 Encounter for antineoplastic radiation therapy: Secondary | ICD-10-CM | POA: Diagnosis not present

## 2017-11-22 DIAGNOSIS — Z170 Estrogen receptor positive status [ER+]: Secondary | ICD-10-CM | POA: Diagnosis not present

## 2017-11-22 DIAGNOSIS — C50412 Malignant neoplasm of upper-outer quadrant of left female breast: Secondary | ICD-10-CM | POA: Diagnosis not present

## 2017-11-23 ENCOUNTER — Ambulatory Visit
Admission: RE | Admit: 2017-11-23 | Discharge: 2017-11-23 | Disposition: A | Discharge status: Home / Self Care | Payer: PPO | Source: Ambulatory Visit | Attending: Radiation Oncology | Admitting: Radiation Oncology

## 2017-11-23 ENCOUNTER — Ambulatory Visit: Payer: PPO

## 2017-11-23 DIAGNOSIS — Z510 Encounter for antineoplastic radiation therapy: Secondary | ICD-10-CM | POA: Diagnosis not present

## 2017-11-23 DIAGNOSIS — C50412 Malignant neoplasm of upper-outer quadrant of left female breast: Secondary | ICD-10-CM | POA: Diagnosis not present

## 2017-11-23 DIAGNOSIS — Z170 Estrogen receptor positive status [ER+]: Secondary | ICD-10-CM | POA: Diagnosis not present

## 2017-11-24 ENCOUNTER — Ambulatory Visit
Admission: RE | Admit: 2017-11-24 | Discharge: 2017-11-24 | Disposition: A | Discharge status: Home / Self Care | Payer: PPO | Source: Ambulatory Visit | Attending: Radiation Oncology | Admitting: Radiation Oncology

## 2017-11-24 ENCOUNTER — Ambulatory Visit: Payer: PPO

## 2017-11-24 DIAGNOSIS — Z510 Encounter for antineoplastic radiation therapy: Secondary | ICD-10-CM | POA: Diagnosis not present

## 2017-11-24 DIAGNOSIS — C50412 Malignant neoplasm of upper-outer quadrant of left female breast: Secondary | ICD-10-CM | POA: Diagnosis not present

## 2017-11-24 DIAGNOSIS — Z170 Estrogen receptor positive status [ER+]: Secondary | ICD-10-CM | POA: Diagnosis not present

## 2017-11-25 ENCOUNTER — Ambulatory Visit: Payer: PPO

## 2017-11-25 ENCOUNTER — Ambulatory Visit
Admission: RE | Admit: 2017-11-25 | Discharge: 2017-11-25 | Disposition: A | Discharge status: Home / Self Care | Payer: PPO | Source: Ambulatory Visit | Attending: Radiation Oncology | Admitting: Radiation Oncology

## 2017-11-25 DIAGNOSIS — Z170 Estrogen receptor positive status [ER+]: Secondary | ICD-10-CM | POA: Diagnosis not present

## 2017-11-25 DIAGNOSIS — C50412 Malignant neoplasm of upper-outer quadrant of left female breast: Secondary | ICD-10-CM | POA: Diagnosis not present

## 2017-11-25 DIAGNOSIS — Z510 Encounter for antineoplastic radiation therapy: Secondary | ICD-10-CM | POA: Diagnosis not present

## 2017-11-26 ENCOUNTER — Ambulatory Visit
Admission: RE | Admit: 2017-11-26 | Discharge: 2017-11-26 | Disposition: A | Discharge status: Home / Self Care | Payer: PPO | Source: Ambulatory Visit | Attending: Radiation Oncology | Admitting: Radiation Oncology

## 2017-11-26 ENCOUNTER — Ambulatory Visit: Payer: PPO

## 2017-11-26 DIAGNOSIS — Z510 Encounter for antineoplastic radiation therapy: Secondary | ICD-10-CM | POA: Diagnosis not present

## 2017-11-29 ENCOUNTER — Ambulatory Visit: Payer: PPO

## 2017-11-29 ENCOUNTER — Ambulatory Visit
Admission: RE | Admit: 2017-11-29 | Discharge: 2017-11-29 | Disposition: A | Discharge status: Home / Self Care | Payer: PPO | Source: Ambulatory Visit | Attending: Radiation Oncology | Admitting: Radiation Oncology

## 2017-11-29 DIAGNOSIS — Z510 Encounter for antineoplastic radiation therapy: Secondary | ICD-10-CM | POA: Insufficient documentation

## 2017-11-29 DIAGNOSIS — C50412 Malignant neoplasm of upper-outer quadrant of left female breast: Secondary | ICD-10-CM | POA: Diagnosis not present

## 2017-11-30 ENCOUNTER — Ambulatory Visit
Admission: RE | Admit: 2017-11-30 | Discharge: 2017-11-30 | Disposition: A | Discharge status: Home / Self Care | Payer: PPO | Source: Ambulatory Visit | Attending: Radiation Oncology | Admitting: Radiation Oncology

## 2017-11-30 ENCOUNTER — Ambulatory Visit: Payer: PPO

## 2017-11-30 DIAGNOSIS — Z510 Encounter for antineoplastic radiation therapy: Secondary | ICD-10-CM | POA: Diagnosis not present

## 2017-12-01 ENCOUNTER — Ambulatory Visit
Admission: RE | Admit: 2017-12-01 | Discharge: 2017-12-01 | Disposition: A | Discharge status: Home / Self Care | Payer: PPO | Source: Ambulatory Visit | Attending: Radiation Oncology | Admitting: Radiation Oncology

## 2017-12-01 ENCOUNTER — Ambulatory Visit: Payer: PPO

## 2017-12-01 ENCOUNTER — Inpatient Hospital Stay: Payer: PPO | Attending: Radiation Oncology

## 2017-12-01 DIAGNOSIS — Z510 Encounter for antineoplastic radiation therapy: Secondary | ICD-10-CM | POA: Diagnosis not present

## 2017-12-01 DIAGNOSIS — C50412 Malignant neoplasm of upper-outer quadrant of left female breast: Secondary | ICD-10-CM | POA: Diagnosis not present

## 2017-12-01 DIAGNOSIS — Z170 Estrogen receptor positive status [ER+]: Secondary | ICD-10-CM

## 2017-12-01 LAB — CBC
HCT: 35.3 % (ref 35.0–47.0)
Hemoglobin: 12.2 g/dL (ref 12.0–16.0)
MCH: 31.7 pg (ref 26.0–34.0)
MCHC: 34.7 g/dL (ref 32.0–36.0)
MCV: 91.3 fL (ref 80.0–100.0)
PLATELETS: 209 10*3/uL (ref 150–440)
RBC: 3.87 MIL/uL (ref 3.80–5.20)
RDW: 14.6 % — AB (ref 11.5–14.5)
WBC: 5.4 10*3/uL (ref 3.6–11.0)

## 2017-12-03 ENCOUNTER — Ambulatory Visit
Admission: RE | Admit: 2017-12-03 | Discharge: 2017-12-03 | Disposition: A | Discharge status: Home / Self Care | Payer: PPO | Source: Ambulatory Visit | Attending: Radiation Oncology | Admitting: Radiation Oncology

## 2017-12-03 ENCOUNTER — Ambulatory Visit: Payer: PPO

## 2017-12-03 DIAGNOSIS — Z510 Encounter for antineoplastic radiation therapy: Secondary | ICD-10-CM | POA: Diagnosis not present

## 2017-12-06 ENCOUNTER — Ambulatory Visit: Payer: PPO

## 2017-12-06 ENCOUNTER — Ambulatory Visit
Admission: RE | Admit: 2017-12-06 | Discharge: 2017-12-06 | Disposition: A | Discharge status: Home / Self Care | Payer: PPO | Source: Ambulatory Visit | Attending: Radiation Oncology | Admitting: Radiation Oncology

## 2017-12-06 DIAGNOSIS — Z510 Encounter for antineoplastic radiation therapy: Secondary | ICD-10-CM | POA: Diagnosis not present

## 2017-12-07 ENCOUNTER — Ambulatory Visit: Payer: PPO

## 2017-12-07 ENCOUNTER — Ambulatory Visit
Admission: RE | Admit: 2017-12-07 | Discharge: 2017-12-07 | Disposition: A | Discharge status: Home / Self Care | Payer: PPO | Source: Ambulatory Visit | Attending: Radiation Oncology | Admitting: Radiation Oncology

## 2017-12-07 DIAGNOSIS — Z510 Encounter for antineoplastic radiation therapy: Secondary | ICD-10-CM | POA: Diagnosis not present

## 2017-12-08 ENCOUNTER — Ambulatory Visit
Admission: RE | Admit: 2017-12-08 | Discharge: 2017-12-08 | Disposition: A | Discharge status: Home / Self Care | Payer: PPO | Source: Ambulatory Visit | Attending: Radiation Oncology | Admitting: Radiation Oncology

## 2017-12-08 ENCOUNTER — Ambulatory Visit: Payer: PPO

## 2017-12-08 DIAGNOSIS — Z170 Estrogen receptor positive status [ER+]: Secondary | ICD-10-CM | POA: Diagnosis not present

## 2017-12-08 DIAGNOSIS — C50412 Malignant neoplasm of upper-outer quadrant of left female breast: Secondary | ICD-10-CM | POA: Diagnosis not present

## 2017-12-08 DIAGNOSIS — Z510 Encounter for antineoplastic radiation therapy: Secondary | ICD-10-CM | POA: Diagnosis not present

## 2017-12-09 ENCOUNTER — Encounter: Payer: Self-pay | Admitting: Oncology

## 2017-12-09 ENCOUNTER — Ambulatory Visit: Payer: PPO

## 2017-12-10 ENCOUNTER — Ambulatory Visit: Payer: PPO

## 2017-12-13 ENCOUNTER — Ambulatory Visit: Payer: PPO

## 2017-12-14 ENCOUNTER — Ambulatory Visit: Payer: PPO

## 2017-12-15 ENCOUNTER — Ambulatory Visit: Payer: PPO

## 2017-12-16 ENCOUNTER — Ambulatory Visit: Payer: PPO

## 2017-12-17 ENCOUNTER — Encounter: Payer: Self-pay | Admitting: Oncology

## 2017-12-17 ENCOUNTER — Ambulatory Visit: Payer: PPO

## 2017-12-20 ENCOUNTER — Ambulatory Visit: Payer: PPO

## 2017-12-21 ENCOUNTER — Ambulatory Visit: Payer: PPO

## 2017-12-22 ENCOUNTER — Ambulatory Visit: Payer: PPO

## 2017-12-23 ENCOUNTER — Ambulatory Visit: Payer: PPO

## 2017-12-23 ENCOUNTER — Ambulatory Visit: Payer: PPO | Admitting: Family Medicine

## 2017-12-25 ENCOUNTER — Other Ambulatory Visit: Payer: Self-pay | Admitting: Family Medicine

## 2018-01-03 ENCOUNTER — Other Ambulatory Visit: Payer: Self-pay

## 2018-01-03 ENCOUNTER — Ambulatory Visit
Admission: RE | Admit: 2018-01-03 | Discharge: 2018-01-03 | Disposition: A | Discharge status: Home / Self Care | Payer: PPO | Source: Ambulatory Visit | Attending: Radiation Oncology | Admitting: Radiation Oncology

## 2018-01-03 VITALS — BP 140/87 | HR 73 | Temp 97.5°F | Resp 12 | Ht 64.0 in | Wt 199.8 lb

## 2018-01-03 DIAGNOSIS — C50912 Malignant neoplasm of unspecified site of left female breast: Secondary | ICD-10-CM | POA: Insufficient documentation

## 2018-01-03 DIAGNOSIS — Z170 Estrogen receptor positive status [ER+]: Secondary | ICD-10-CM | POA: Insufficient documentation

## 2018-01-03 DIAGNOSIS — Z08 Encounter for follow-up examination after completed treatment for malignant neoplasm: Secondary | ICD-10-CM | POA: Insufficient documentation

## 2018-01-03 DIAGNOSIS — Z923 Personal history of irradiation: Secondary | ICD-10-CM | POA: Diagnosis not present

## 2018-01-03 DIAGNOSIS — C50412 Malignant neoplasm of upper-outer quadrant of left female breast: Secondary | ICD-10-CM

## 2018-01-03 NOTE — Progress Notes (Signed)
Radiation Oncology Follow up Note  Name: Dana Obrien   Date:   01/03/2018 MRN:  [id number] DOB: 31-Aug-1945    This 72 y.o. female presents to the clinic today for One-month follow-up status post.whole breast radiation to her left breast for ER/PR positive HER-2 negative invasive mammary carcinoma  REFERRING PROVIDER: Leone Haven, MD  HPI: patient is a 72 year old female now out 1 month having completed whole breast radiation to her left breast for ER/PR positive HER-2 negative invasive mammary carcinoma. She is seen today in routine follow-up she is doing well. She specifically denies breast tenderness cough or bone pain..she's been started on Aromasin tolerating that well without side effect.  COMPLICATIONS OF TREATMENT: none  FOLLOW UP COMPLIANCE: keeps appointments   PHYSICAL EXAM:  BP 140/87 (BP Location: Left Wrist, Patient Position: Sitting, Cuff Size: Normal)   Pulse 73   Temp (!) 97.5 F (36.4 C)   Resp 12   Ht _0  (1.626 m)   Wt 199 lb 13.6 oz (90.6 kg)   BMI 34.30 kg/m  Lungs are clear to A&P cardiac examination essentially unremarkable with regular rate and rhythm. No dominant mass or nodularity is noted in either breast in 2 positions examined. Incision is well-healed. No axillary or supraclavicular adenopathy is appreciated. Cosmetic result is excellent.there is still a seroma with probable blood in her left breast unchanged from prior examinationWell-developed well-nourished patient in NAD. HEENT reveals PERLA, EOMI, discs not visualized.  Oral cavity is clear. No oral mucosal lesions are identified. Neck is clear without evidence of cervical or supraclavicular adenopathy. Lungs are clear to A&P. Cardiac examination is essentially unremarkable with regular rate and rhythm without murmur rub or thrill. Abdomen is benign with no organomegaly or masses noted. Motor sensory and DTR levels are equal and symmetric in the upper and lower extremities. Cranial nerves II  through XII are grossly intact. Proprioception is intact. No peripheral adenopathy or edema is identified. No motor or sensory levels are noted. Crude visual fields are within normal range.  RADIOLOGY RESULTS: no current films for review  PLAN: present time patient is doing well. I am please were overall progress. She continues on Aromasin at this time under medical oncology's direction. I have asked to see her back in 4-5 months for follow-up. She knows to call at anytime with any concerns.  I would like to take this opportunity to thank you for allowing me to participate in the care of your patient.Noreene Filbert, MD

## 2018-01-05 ENCOUNTER — Encounter: Payer: Self-pay | Admitting: *Deleted

## 2018-01-11 ENCOUNTER — Ambulatory Visit: Payer: PPO | Admitting: General Surgery

## 2018-01-19 ENCOUNTER — Other Ambulatory Visit: Payer: Self-pay | Admitting: Family Medicine

## 2018-01-20 ENCOUNTER — Other Ambulatory Visit: Payer: Self-pay | Admitting: Family Medicine

## 2018-01-21 DIAGNOSIS — B9689 Other specified bacterial agents as the cause of diseases classified elsewhere: Secondary | ICD-10-CM | POA: Diagnosis not present

## 2018-01-21 DIAGNOSIS — J019 Acute sinusitis, unspecified: Secondary | ICD-10-CM | POA: Diagnosis not present

## 2018-01-26 ENCOUNTER — Ambulatory Visit
Admission: RE | Admit: 2018-01-26 | Discharge: 2018-01-26 | Disposition: A | Discharge status: Home / Self Care | Payer: PPO | Source: Ambulatory Visit | Attending: Oncology | Admitting: Oncology

## 2018-01-26 DIAGNOSIS — C50412 Malignant neoplasm of upper-outer quadrant of left female breast: Secondary | ICD-10-CM | POA: Diagnosis not present

## 2018-01-26 DIAGNOSIS — Z13820 Encounter for screening for osteoporosis: Secondary | ICD-10-CM | POA: Diagnosis not present

## 2018-01-26 DIAGNOSIS — Z170 Estrogen receptor positive status [ER+]: Secondary | ICD-10-CM | POA: Insufficient documentation

## 2018-01-26 DIAGNOSIS — Z780 Asymptomatic menopausal state: Secondary | ICD-10-CM

## 2018-02-01 ENCOUNTER — Inpatient Hospital Stay: Payer: PPO

## 2018-02-01 ENCOUNTER — Inpatient Hospital Stay: Payer: PPO | Admitting: Oncology

## 2018-02-14 ENCOUNTER — Other Ambulatory Visit: Payer: Self-pay

## 2018-02-14 DIAGNOSIS — Z170 Estrogen receptor positive status [ER+]: Principal | ICD-10-CM

## 2018-02-14 DIAGNOSIS — C50412 Malignant neoplasm of upper-outer quadrant of left female breast: Secondary | ICD-10-CM

## 2018-02-15 ENCOUNTER — Inpatient Hospital Stay: Payer: PPO | Attending: Oncology

## 2018-02-15 ENCOUNTER — Encounter: Payer: Self-pay | Admitting: Oncology

## 2018-02-15 ENCOUNTER — Inpatient Hospital Stay (HOSPITAL_BASED_OUTPATIENT_CLINIC_OR_DEPARTMENT_OTHER): Payer: PPO | Admitting: Oncology

## 2018-02-15 VITALS — BP 127/75 | HR 82 | Temp 98.1°F | Resp 18 | Ht 64.0 in | Wt 204.6 lb

## 2018-02-15 DIAGNOSIS — Z79811 Long term (current) use of aromatase inhibitors: Secondary | ICD-10-CM | POA: Diagnosis not present

## 2018-02-15 DIAGNOSIS — Z853 Personal history of malignant neoplasm of breast: Secondary | ICD-10-CM | POA: Insufficient documentation

## 2018-02-15 DIAGNOSIS — Z9221 Personal history of antineoplastic chemotherapy: Secondary | ICD-10-CM

## 2018-02-15 DIAGNOSIS — Z170 Estrogen receptor positive status [ER+]: Secondary | ICD-10-CM | POA: Insufficient documentation

## 2018-02-15 DIAGNOSIS — Z923 Personal history of irradiation: Secondary | ICD-10-CM | POA: Insufficient documentation

## 2018-02-15 DIAGNOSIS — C50912 Malignant neoplasm of unspecified site of left female breast: Secondary | ICD-10-CM

## 2018-02-15 DIAGNOSIS — C50412 Malignant neoplasm of upper-outer quadrant of left female breast: Secondary | ICD-10-CM

## 2018-02-15 DIAGNOSIS — Z08 Encounter for follow-up examination after completed treatment for malignant neoplasm: Secondary | ICD-10-CM

## 2018-02-15 LAB — CBC WITH DIFFERENTIAL/PLATELET
BASOS ABS: 0.1 10*3/uL (ref 0–0.1)
BASOS PCT: 1 %
EOS PCT: 4 %
Eosinophils Absolute: 0.3 10*3/uL (ref 0–0.7)
HCT: 36.6 % (ref 35.0–47.0)
Hemoglobin: 12.4 g/dL (ref 12.0–16.0)
LYMPHS PCT: 22 %
Lymphs Abs: 1.6 10*3/uL (ref 1.0–3.6)
MCH: 31.7 pg (ref 26.0–34.0)
MCHC: 33.8 g/dL (ref 32.0–36.0)
MCV: 93.7 fL (ref 80.0–100.0)
MONO ABS: 0.7 10*3/uL (ref 0.2–0.9)
Monocytes Relative: 10 %
Neutro Abs: 4.5 10*3/uL (ref 1.4–6.5)
Neutrophils Relative %: 63 %
PLATELETS: 222 10*3/uL (ref 150–440)
RBC: 3.91 MIL/uL (ref 3.80–5.20)
RDW: 14 % (ref 11.5–14.5)
WBC: 7.1 10*3/uL (ref 3.6–11.0)

## 2018-02-15 LAB — COMPREHENSIVE METABOLIC PANEL
ALT: 22 U/L (ref 0–44)
AST: 25 U/L (ref 15–41)
Albumin: 4.1 g/dL (ref 3.5–5.0)
Alkaline Phosphatase: 60 U/L (ref 38–126)
Anion gap: 11 (ref 5–15)
BUN: 18 mg/dL (ref 8–23)
CALCIUM: 9.3 mg/dL (ref 8.9–10.3)
CO2: 28 mmol/L (ref 22–32)
Chloride: 101 mmol/L (ref 98–111)
Creatinine, Ser: 0.81 mg/dL (ref 0.44–1.00)
GLUCOSE: 108 mg/dL — AB (ref 70–99)
Potassium: 4.8 mmol/L (ref 3.5–5.1)
Sodium: 140 mmol/L (ref 135–145)
Total Bilirubin: 0.6 mg/dL (ref 0.3–1.2)
Total Protein: 7.3 g/dL (ref 6.5–8.1)

## 2018-02-15 NOTE — Progress Notes (Signed)
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  She is currently on Aromasin  Chief complaint/ Reason for visit-routine follow-up of breast cancer  Heme/Onc history: patient is a 72 year old female with a prior history of right breast cancer in 2004[facility]. T2N0M0.She thenn completed 5 years of aromasin until 2009.She could not tolerate arimidex.  Recent screening mammogram on 08/25/2017 showed a possible mass in her left breast. This was followed by an ultrasound and a diagnostic left mammogram which showed a 0.7 x 0.5 x 0.7 cm mass in the left breast. No evidence of left axillary adenopathy.  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 ER greater than 90% positive, PR greater than 90% positive and HER-2/neu negative   Patient has been seen by Dr. Camelia Phenes is scheduled to undergo surgery on 09/17/2017  She is G2P2L2. Menarche at the age of 50. Menopause in her early 30's. She used to take prempro from [age] yrs but none since then. She does have chronic back pain and tingling/pain in her extremities. Cervical spine MRI showed egenrative disease  Final pathology showed:Invasive mammary carcinoma 13 mm, grade 1 with associated DCIS. Negative margins. 0 out of 3 lymph nodes were positive for malignancy. 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   Interval history-patient has now completed radiation treatment and is slowly recovering from it.  She also started taking Aromasin which she is tolerating well without any significant side effects other than mild hot flashes which are self-limited.  ECOG PS- 0 Pain scale- 0 Opioid associated constipation- no  Review of systems- Review of Systems  Constitutional: Negative for chills, fever, malaise/fatigue and weight loss.  HENT: Negative for congestion, ear discharge and nosebleeds.   Eyes: Negative for blurred vision.  Respiratory: Negative for cough, hemoptysis, sputum production, shortness of breath and wheezing.   Cardiovascular: Negative for chest pain, palpitations, orthopnea and claudication.  Gastrointestinal: Negative for abdominal pain, blood in stool, constipation, diarrhea, heartburn, melena, nausea and vomiting.  Genitourinary: Negative for dysuria, flank pain, frequency, hematuria and urgency.  Musculoskeletal: Negative for back pain, joint pain and myalgias.  Skin: Negative for rash.  Neurological: Negative for dizziness, tingling, focal weakness, seizures, weakness and headaches.  Endo/Heme/Allergies: Does not bruise/bleed easily.       Hot flashes  Psychiatric/Behavioral: Negative for depression and suicidal ideas. The patient does not have insomnia.       Allergies  Allergen Reactions  . Lidocaine Palpitations  . Codeine Nausea Only and Nausea And Vomiting  . Morphine Nausea Only  . Niacin Rash  . Niacin And Related Rash    ANTIHYPERLIPEMICS     Past Medical History:  Diagnosis Date  . Anxiety   .  Arthritis   . Asthma   . Breast cancer ([place]) 2004   right breast  . Breast cancer of upper-outer  quadrant of left female breast ([place]) 09/17/2017   T1c, N0, ER 90%, PR 90%, 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  . Cancer [facility]) 2004   right lumpectomy, chemotherapy and radiation Dr. Jeb Levering  . Depression   . GERD (gastroesophageal reflux disease)   . Hypertension   . Migraine   . Personal history of chemotherapy   . Personal history of radiation therapy   . PONV (postoperative nausea and vomiting)   . Shingles   . Sleep apnea    uses CPAP, severe OSA  . Vitamin D deficiency    IN THE PAST     Past Surgical History:  Procedure Laterality Date  . ABDOMINAL HYSTERECTOMY    . ANTERIOR AND POSTERIOR REPAIR    . BREAST BIOPSY Right    stereo prior to lumpectomy  . BREAST BIOPSY Left 09/07/2017   US guided biopsy  . 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  3.5 cm maximum diameter, minimal margins 5 mm.  In situ component less than 10%.  Marland Kitchen BREAST LUMPECTOMY Right 2004  . BREAST LUMPECTOMY Left 09/17/2017   Procedure: BREAST EXCISION, SENTINEL NODE BIOPSY;  Surgeon: Robert Bellow, MD;  Location: [facility] ORS;  Service: General;  Laterality: Left;  . ETHMOIDECTOMY Bilateral 11/05/2016   Procedure: ETHMOIDECTOMY;  Surgeon: Melida Quitter, MD;  Location: [place];  Service: ENT;  Laterality: Bilateral;  . MAXILLARY ANTROSTOMY Bilateral 11/05/2016   Procedure: MAXILLARY ANTROSTOMY;  Surgeon: Melida Quitter, MD;  Location: [place];  Service: ENT;  Laterality: Bilateral;  . SINUS ENDO W/FUSION Bilateral 11/05/2016   Procedure: FRONTAL RECESS EXPLORATION;  Surgeon: Melida Quitter, MD;  Location: [place];  Service: ENT;  Laterality: Bilateral;  BILATERAL ENDOSCOPIC SINUS SURGERY WITH FUSION  . SINUS EXPLORATION  11/05/2016  . SPHENOIDECTOMY Bilateral 11/05/2016   Procedure: SPHENOIDECTOMY;  Surgeon: Melida Quitter, MD;  Location: [facility];  Service: ENT;  Laterality: Bilateral;    Social History   Socioeconomic History  . Marital status: Widowed     Spouse name: Not on file  . Number of children: Not on file  . Years of education: Not on file  . Highest education level: Not on file  Occupational History  . Not on file  Social Needs  . Financial resource strain: Not on file  . Food insecurity:    Worry: Not on file    Inability: Not on file  . Transportation needs:    Medical: Not on file    Non-medical: Not on file  Tobacco Use  . Smoking status: Never Smoker  . Smokeless tobacco: Never Used  Substance and Sexual Activity  . Alcohol use: Yes    Comment: rarely  . Drug use: No  . Sexual activity: Not on file  Lifestyle  . Physical activity:    Days per week: Not on file    Minutes per session: Not on file  . Stress: Not on file  Relationships  . Social connections:    Talks on phone: Not on file    Gets together: Not on file    Attends religious service: Not on file    Active member of club or organization: Not on file    Attends meetings of clubs or organizations: Not on file    Relationship status: Not on file  . Intimate partner violence:  Fear of current or ex partner: Not on file    Emotionally abused: Not on file    Physically abused: Not on file    Forced sexual activity: Not on file  Other Topics Concern  . Not on file  Social History Narrative   Daily Caffeine Use:  1-2 coffee   Regular Exercise -  NO    Family History  Problem Relation Age of Onset  . Breast cancer Neg Hx      Current Outpatient Medications:  .  Azelastine-Fluticasone 137-50 MCG/ACT SUSP, Place 1 spray into the nose 2 (two) times daily., Disp: 23 g, Rfl: 4 .  buPROPion (WELLBUTRIN XL) 150 MG 24 hr tablet, Take 1 tablet (150 mg total) by mouth daily., Disp: 90 tablet, Rfl: 1 .  celecoxib (CELEBREX) 100 MG capsule, Take 1 capsule (100 mg total) by mouth 2 (two) times daily., Disp: 180 capsule, Rfl: 1 .  EQ ALLERGY RELIEF, CETIRIZINE, 10 MG tablet, TAKE 1 TABLET BY MOUTH ONCE DAILY, Disp: 90 tablet, Rfl: 1 .  exemestane  (AROMASIN) 25 MG tablet, Take 1 tablet (25 mg total) by mouth daily after breakfast., Disp: 30 tablet, Rfl: 1 .  FLUoxetine (PROZAC) 20 MG capsule, TAKE 3 CAPSULES BY MOUTH ONCE DAILY, Disp: 90 capsule, Rfl: 1 .  hydrochlorothiazide (HYDRODIURIL) 12.5 MG tablet, Take 1 tablet (12.5 mg total) by mouth daily., Disp: 90 tablet, Rfl: 3 .  Melatonin 10 MG TABS, Take 1 tablet by mouth at bedtime., Disp: , Rfl:  .  montelukast (SINGULAIR) 10 MG tablet, Take 1 tablet (10 mg total) by mouth at bedtime., Disp: 30 tablet, Rfl: 3 .  Multiple Vitamin (MULTIVITAMIN) capsule, Take 1 capsule by mouth daily., Disp: , Rfl:  .  pantoprazole (PROTONIX) 40 MG tablet, TAKE 1 TABLET BY MOUTH ONCE DAILY, Disp: 90 tablet, Rfl: 2 .  rosuvastatin (CRESTOR) 20 MG tablet, TAKE 1 TABLET BY MOUTH ONCE DAILY, Disp: 90 tablet, Rfl: 3 .  albuterol (PROVENTIL HFA;VENTOLIN HFA) 108 (90 Base) MCG/ACT inhaler, Inhale 2 puffs into the lungs every 6 (six) hours as needed. (Patient not taking: Reported on 02/15/2018), Disp: 1 Inhaler, Rfl: 3 .  ALPRAZolam (XANAX) 0.5 MG tablet, Take 1 tablet (0.5 mg total) by mouth 2 (two) times daily as needed for anxiety. (Patient not taking: Reported on 02/15/2018), Disp: 90 tablet, Rfl: 1 .  docusate sodium (COLACE) 100 MG capsule, Take 1 tablet once or twice daily as needed for constipation while taking narcotic pain medicine (Patient not taking: Reported on 02/15/2018), Disp: 30 capsule, Rfl: 0 .  FLUoxetine (PROZAC) 20 MG capsule, TAKE THREE CAPSULES BY MOUTH ONCE DAILY, Disp: 90 capsule, Rfl: 1 .  lidocaine-prilocaine (EMLA) cream, Apply 1 application topically as needed. Apply to left areola the morning of surgery, cover with Saran wrap. (Patient not taking: Reported on 02/15/2018), Disp: 5 g, Rfl: 0 .  promethazine (PHENERGAN) 12.5 MG tablet, Take 1 tablet (12.5 mg total) by mouth every 6 (six) hours as needed for nausea or vomiting. (Patient not taking: Reported on 02/15/2018), Disp: 30 tablet, Rfl: 0 .   traMADol (ULTRAM) 50 MG tablet, TAKE 1 TABLET BY MOUTH EVERY 6 HOURS AS NEEDED FOR MODERATE PAIN (Patient not taking: Reported on 02/15/2018), Disp: 90 tablet, Rfl: 1 .  tretinoin (RETIN-A) 0.05 % cream, Apply 1 application topically at bedtime. (Patient not taking: Reported on 02/15/2018), Disp: 45 g, Rfl: 0  Physical exam:  Vitals:   02/15/18 1105  BP: 127/75  Pulse: 82  Resp:  18  Temp: 98.1 F (36.7 C)  TempSrc: Tympanic  SpO2: 97%  Weight: 204 lb 9.6 oz (92.8 kg)  Height: 5' 4"  (1.626 m)   Physical Exam  Constitutional: She is oriented to person, place, and time. She appears well-developed and well-nourished.  HENT:  Head: Normocephalic and atraumatic.  Eyes: Pupils are equal, round, and reactive to light. EOM are normal.  Neck: Normal range of motion.  Cardiovascular: Normal rate, regular rhythm and normal heart sounds.  Pulmonary/Chest: Effort normal and breath sounds normal.  Abdominal: Soft. Bowel sounds are normal.  Neurological: She is alert and oriented to person, place, and time.  Skin: Skin is warm and dry.  Mild erythema noted over the region of the left breast  CMP Latest Ref Rng & Units 02/15/2018  Glucose 70 - 99 mg/dL 108(H)  BUN 8 - 23 mg/dL 18  Creatinine 0.44 - 1.00 mg/dL 0.81  Sodium 135 - 145 mmol/L 140  Potassium 3.5 - 5.1 mmol/L 4.8  Chloride 98 - 111 mmol/L 101  CO2 22 - 32 mmol/L 28  Calcium 8.9 - 10.3 mg/dL 9.3  Total Protein 6.5 - 8.1 g/dL 7.3  Total Bilirubin 0.3 - 1.2 mg/dL 0.6  Alkaline Phos 38 - 126 U/L 60  AST 15 - 41 U/L 25  ALT 0 - 44 U/L 22   CBC Latest Ref Rng & Units 02/15/2018  WBC 3.6 - 11.0 K/uL 7.1  Hemoglobin 12.0 - 16.0 g/dL 12.4  Hematocrit 35.0 - 47.0 % 36.6  Platelets 150 - 440 K/uL 222    No images are attached to the encounter.  Dg Bone Density  Result Date: 01/26/2018 EXAM: DUAL X-RAY ABSORPTIOMETRY (DXA) FOR BONE MINERAL DENSITY IMPRESSION: Dear Dr. Janese Banks, Your patient Collin Hendley completed a BMD test on  01/26/2018 using the Standard (analysis version: 14.10) manufactured by [employer]. The following summarizes the results of our evaluation. PATIENT BIOGRAPHICAL: Name: Breiana, Stratmann Patient ID: [id number] Birth Date: January 25, 1946 Height: 64.0 in. Gender: Female Exam Date: 01/26/2018 Weight: 199.7 lbs. Indications: Advanced Age, Breast CA, Caucasian, High Risk Meds, History of Breast Cancer, History of Chemo, History of Radiation, Postmenopausal, Previous Chemo and Radiation Fractures: Treatments: Aromasin, Calcium, Multi-Vitamin, Vitamin D ASSESSMENT: The BMD measured at Forearm Radius 33% is 0.837 g/cm2 with a T-score of -0.4. This patient is considered normal according to [facility]) criteria. Lumbar spine was not utilized due to (advanced degenerative changes/scoliosis, etc.) Site Region Measured Measured WHO Young Adult BMD Date       Age      Classification T-score DualFemur Total Left 01/26/2018 70.4 Normal 1.9 1.250 g/cm2 DualFemur Total Left 05/06/2011 63.7 Normal 2.0 1.257 g/cm2 DualFemur Total Left 05/06/2011 63.7 Normal 1.9 1.245 g/cm2 DualFemur Total Mean 01/26/2018 70.4 Normal 1.7 1.217 g/cm2 DualFemur Total Mean 05/06/2011 63.7 Normal 1.7 1.224 g/cm2 DualFemur Total Mean 05/06/2011 63.7 Normal 1.6 1.203 g/cm2 Left Forearm Radius 33% 01/26/2018 70.4 Normal -0.4 0.837 g/cm2 Left Forearm Radius 33% 05/06/2011 63.7 Normal -1.0 0.790 g/cm2 Left Forearm Radius 33% 05/06/2011 63.7 Osteopenia -1.2 0.772 g/cm2 World Health Organization [facility]) criteria for post-menopausal, Caucasian Women: Normal:       T-score at or above -1 SD Osteopenia:   T-score between -1 and -2.5 SD Osteoporosis: T-score at or below -2.5 SD RECOMMENDATIONS: 1. All patients should optimize calcium and vitamin D intake. 2. Consider FDA-approved medical therapies in postmenopausal women and men aged 73 years and older, based on the following: a. A hip  or vertebral(clinical or morphometric) fracture b.  T-score < -2.5 at the femoral neck or spine after appropriate evaluation to exclude secondary causes c. Low bone mass (T-score between -1.0 and -2.5 at the femoral neck or spine) and a 10-year probability of a hip fracture > 3% or a 10-year probability of a major osteoporosis-related fracture > 20% based on the [place]-adapted WHO algorithm d. Clinician judgment and/or patient preferences may indicate treatment for people with 10-year fracture probabilities above or below these levels FOLLOW-UP: People with diagnosed cases of osteoporosis or at high risk for fracture should have regular bone mineral density tests. For patients eligible for Medicare, routine testing is allowed once every 2 years. The testing frequency can be increased to one year for patients who have rapidly progressing disease, those who are receiving or discontinuing medical therapy to restore bone mass, or have additional risk factors. I have reviewed this report, and agree with the above findings. [facility] Electronically Signed   By: Marijo Conception, M.D.   On: 01/26/2018 16:06     Assessment and plan- Patient is a 72 y.o. female with invasive mammary carcinoma of the left breast stage IA pT1c pN0 cM0 ER PR positive and HER-2/neu negative status post lumpectomy and radiation treatment.  She is currently on Aromasin and is here for routine follow-up of breast cancer  Patient is tolerating her Aromasin well except for mild hot flashes.  Her CBC and CMP are normal today.  Her baseline bone density scan was also normal.  She will continue taking calcium and vitamin D along with Aromasin.  I will see her back in 3 months time for routine follow-up.  Patient wishes to discuss reconstruction of her right breast for cosmetic reasons and I have encouraged her to speak with Dr. Bary Castilla at her next visit   Visit Diagnosis 1. Encounter for follow-up surveillance of breast cancer   2. Use of exemestane (Aromasin)      Dr. Randa Evens,  MD, MPH [facility] at [facility] [phone] 02/15/2018 2:16 PM

## 2018-02-15 NOTE — Progress Notes (Signed)
No new changes noted today 

## 2018-02-25 DIAGNOSIS — G4733 Obstructive sleep apnea (adult) (pediatric): Secondary | ICD-10-CM | POA: Diagnosis not present

## 2018-03-02 ENCOUNTER — Other Ambulatory Visit: Payer: Self-pay | Admitting: Oncology

## 2018-03-08 ENCOUNTER — Ambulatory Visit: Payer: PPO | Admitting: General Surgery

## 2018-03-11 DIAGNOSIS — H6981 Other specified disorders of Eustachian tube, right ear: Secondary | ICD-10-CM | POA: Diagnosis not present

## 2018-03-11 DIAGNOSIS — Z9989 Dependence on other enabling machines and devices: Secondary | ICD-10-CM | POA: Diagnosis not present

## 2018-03-11 DIAGNOSIS — R42 Dizziness and giddiness: Secondary | ICD-10-CM | POA: Diagnosis not present

## 2018-03-11 DIAGNOSIS — J329 Chronic sinusitis, unspecified: Secondary | ICD-10-CM | POA: Diagnosis not present

## 2018-03-11 DIAGNOSIS — H8111 Benign paroxysmal vertigo, right ear: Secondary | ICD-10-CM | POA: Insufficient documentation

## 2018-03-11 DIAGNOSIS — J343 Hypertrophy of nasal turbinates: Secondary | ICD-10-CM | POA: Diagnosis not present

## 2018-03-11 DIAGNOSIS — H6991 Unspecified Eustachian tube disorder, right ear: Secondary | ICD-10-CM | POA: Insufficient documentation

## 2018-03-14 DIAGNOSIS — M47816 Spondylosis without myelopathy or radiculopathy, lumbar region: Secondary | ICD-10-CM | POA: Diagnosis not present

## 2018-03-14 DIAGNOSIS — M545 Low back pain: Secondary | ICD-10-CM | POA: Diagnosis not present

## 2018-03-14 DIAGNOSIS — M419 Scoliosis, unspecified: Secondary | ICD-10-CM | POA: Diagnosis not present

## 2018-03-22 ENCOUNTER — Other Ambulatory Visit: Payer: Self-pay

## 2018-03-22 MED ORDER — CELECOXIB 100 MG PO CAPS: 100.0000 mg | ORAL_CAPSULE | Freq: Two times a day (BID) | ORAL | 1 refills | Status: DC

## 2018-03-22 NOTE — Telephone Encounter (Signed)
Fax from [facility] @ [place]   Refill request for CELEBREX 100 MG cap.   Last OV 08/24/2017   Last refilled 12/09/2017 disp 180 with 1 refill   Pt will be without medication in December as of now no refills at the pharmacy.   Sent to PCP for approval

## 2018-04-13 ENCOUNTER — Other Ambulatory Visit: Payer: Self-pay | Admitting: Family Medicine

## 2018-04-14 NOTE — Telephone Encounter (Signed)
It appears that the patient noted that she was not taking these medications when she last saw her oncologist.  Please contact the patient and see if she has been taking these medications.  If so I will consider refilling them.

## 2018-04-15 NOTE — Telephone Encounter (Signed)
Noted.  Short-term refill sent to pharmacy.  She needs to schedule follow-up.  Thanks.

## 2018-04-23 DIAGNOSIS — J0100 Acute maxillary sinusitis, unspecified: Secondary | ICD-10-CM | POA: Diagnosis not present

## 2018-04-30 ENCOUNTER — Other Ambulatory Visit: Payer: Self-pay | Admitting: Family Medicine

## 2018-05-02 NOTE — Telephone Encounter (Signed)
Last OV 08/24/2017   Last refilled 01/19/2018 disp 90 with 1 refill   Should have refills until February but will have no refills on file at pharmacy.   Sent to PCP to advise

## 2018-05-02 NOTE — Telephone Encounter (Signed)
Refill sent to pharmacy.  Please contact the patient to get her set up for follow-up.  Thanks.

## 2018-05-12 ENCOUNTER — Other Ambulatory Visit: Payer: Self-pay

## 2018-05-12 ENCOUNTER — Ambulatory Visit (INDEPENDENT_AMBULATORY_CARE_PROVIDER_SITE_OTHER): Payer: PPO | Admitting: General Surgery

## 2018-05-12 ENCOUNTER — Encounter: Payer: Self-pay | Admitting: General Surgery

## 2018-05-12 VITALS — BP 166/85 | HR 88 | Temp 97.7°F | Resp 14 | Ht 65.0 in | Wt 200.0 lb

## 2018-05-12 DIAGNOSIS — C50412 Malignant neoplasm of upper-outer quadrant of left female breast: Secondary | ICD-10-CM

## 2018-05-12 DIAGNOSIS — Z170 Estrogen receptor positive status [ER+]: Secondary | ICD-10-CM

## 2018-05-12 NOTE — Progress Notes (Signed)
Patient ID: Dana Obrien, female   DOB: 31-Oct-1945, 72 y.o.   MRN: [id number]  Chief Complaint  Patient presents with  . Breast Cancer    HPI Dana Obrien is a 72 y.o. female here today for her follow up breat cancer check up. Patient states she is doing well.  HPI  Past Medical History:  Diagnosis Date  . Anxiety   . Arthritis   . Asthma   . Breast cancer ([place]) 2004   right breast  . Breast cancer of upper-outer quadrant of left female breast ([place]) 09/17/2017   T1c, N0, ER 90%, PR 90%, 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  . Cancer [facility]) 2004   right lumpectomy, chemotherapy and radiation Dr. Jeb Levering  . Depression   . GERD (gastroesophageal reflux disease)   . Hypertension   . Migraine   . Personal history of chemotherapy   . Personal history of radiation therapy   . PONV (postoperative nausea and vomiting)   . Shingles   . Sleep apnea    uses CPAP, severe OSA  . Vitamin D deficiency    IN THE PAST    Past Surgical History:  Procedure Laterality Date  . ABDOMINAL HYSTERECTOMY    . ANTERIOR AND POSTERIOR REPAIR    . BREAST BIOPSY Right    stereo prior to lumpectomy  . BREAST BIOPSY Left 09/07/2017   US guided biopsy  . 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  3.5 cm maximum diameter, minimal margins 5 mm.  In situ component less than 10%.  Marland Kitchen BREAST LUMPECTOMY Right 2004  . BREAST LUMPECTOMY Left 09/17/2017   Procedure: BREAST EXCISION, SENTINEL NODE BIOPSY;  Surgeon: Robert Bellow, MD;  Location: [facility] OR[facility];  Service: General;  Laterality: Left;  . COLONOSCOPY  2015  . ETHMOIDECTOMY Bilateral 11/05/2016   Procedure: ETHMOIDECTOMY;  Surgeon: Melida Quitter, MD;  Location: [place];  Service: ENT;  Laterality: Bilateral;  . MAXILLARY ANTROSTOMY Bilateral 11/05/2016   Procedure: MAXILLARY ANTROSTOMY;  Surgeon: Melida Quitter, MD;  Location: [place];  Service: ENT;  Laterality: Bilateral;  . SINUS ENDO  W/FUSION Bilateral 11/05/2016   Procedure: FRONTAL RECESS EXPLORATION;  Surgeon: Melida Quitter, MD;  Location: [place];  Service: ENT;  Laterality: Bilateral;  BILATERAL ENDOSCOPIC SINUS SURGERY WITH FUSION  . SINUS EXPLORATION  11/05/2016  . SPHENOIDECTOMY Bilateral 11/05/2016   Procedure: SPHENOIDECTOMY;  Surgeon: Melida Quitter, MD;  Location: [facility];  Service: ENT;  Laterality: Bilateral;    Family History  Problem Relation Age of Onset  . Breast cancer Neg Hx     Social History Social History   Tobacco Use  . Smoking status: Never Smoker  . Smokeless tobacco: Never Used  Substance Use Topics  . Alcohol use: Yes    Comment: rarely  . Drug use: No    Allergies  Allergen Reactions  . Lidocaine Palpitations  . Codeine Nausea Only and Nausea And Vomiting  . Morphine Nausea Only  . Niacin Rash  . Niacin And Related Rash    ANTIHYPERLIPEMICS    Current Outpatient Medications  Medication Sig Dispense Refill  . albuterol (PROVENTIL HFA;VENTOLIN HFA) 108 (90 Base) MCG/ACT inhaler Inhale 2 puffs into the lungs every 6 (six) hours as needed. 1 Inhaler 3  . ALPRAZolam (XANAX) 0.5 MG tablet TAKE 1 TABLET BY MOUTH TWICE DAILY AS NEEDED FOR ANXIETY 60 tablet 0  . Azelastine-Fluticasone 137-50 MCG/ACT SUSP Place 1 spray into the nose 2 (two)  times daily. 23 g 4  . celecoxib (CELEBREX) 100 MG capsule Take 1 capsule (100 mg total) by mouth 2 (two) times daily. 180 capsule 1  . docusate sodium (COLACE) 100 MG capsule Take 1 tablet once or twice daily as needed for constipation while taking narcotic pain medicine 30 capsule 0  . EQ ALLERGY RELIEF, CETIRIZINE, 10 MG tablet TAKE 1 TABLET BY MOUTH ONCE DAILY 90 tablet 1  . exemestane (AROMASIN) 25 MG tablet TAKE 1 TABLET BY MOUTH ONCE DAILY AFTER  BREAKFAST 30 tablet 1  . FLUoxetine (PROZAC) 20 MG capsule TAKE THREE CAPSULES BY MOUTH ONCE DAILY 90 capsule 1  . FLUoxetine (PROZAC) 20 MG capsule TAKE 3 CAPSULES BY MOUTH ONCE DAILY 90 capsule 1  .  hydrochlorothiazide (HYDRODIURIL) 12.5 MG tablet Take 1 tablet (12.5 mg total) by mouth daily. 90 tablet 3  . Melatonin 10 MG TABS Take 1 tablet by mouth at bedtime.    . Multiple Vitamin (MULTIVITAMIN) capsule Take 1 capsule by mouth daily.    . pantoprazole (PROTONIX) 40 MG tablet TAKE 1 TABLET BY MOUTH ONCE DAILY 90 tablet 2  . rosuvastatin (CRESTOR) 20 MG tablet TAKE 1 TABLET BY MOUTH ONCE DAILY 90 tablet 3  . traMADol (ULTRAM) 50 MG tablet TAKE 1 TABLET BY MOUTH EVERY 6 HOURS AS NEEDED FOR MODERATE PAIN 30 tablet 0  . tretinoin (RETIN-A) 0.05 % cream Apply 1 application topically at bedtime. 45 g 0   No current facility-administered medications for this visit.     Review of Systems Review of Systems  Constitutional: Negative.   Respiratory: Negative.   Cardiovascular: Negative.     Blood pressure (!) 166/85, pulse 88, temperature 97.7 F (36.5 C), temperature source Skin, resp. rate 14, height 5' 5"  (1.651 m), weight 200 lb (90.7 kg), SpO2 98 %.  Physical Exam Physical Exam Constitutional:      Appearance: She is well-developed.  Eyes:     General: No scleral icterus.    Conjunctiva/sclera: Conjunctivae normal.  Neck:     Musculoskeletal: Neck supple.  Cardiovascular:     Rate and Rhythm: Normal rate and regular rhythm.     Heart sounds: Normal heart sounds.  Pulmonary:     Effort: Pulmonary effort is normal.     Breath sounds: Normal breath sounds.  Chest:     Breasts: Breasts are asymmetrical ( right breast one cups size smaller than left breast. ).        Right: No inverted nipple, mass, nipple discharge, skin change or tenderness.        Left: No inverted nipple, mass, nipple discharge, skin change or tenderness.       Comments: 3 by 3 mm area upper chest wall  2 cm thickening in the right upper quardrant Lymphadenopathy:     Cervical: No cervical adenopathy.  Skin:    General: Skin is warm and dry.  Neurological:     Mental Status: She is alert and oriented  to person, place, and time.     Data Reviewed Medical oncology notes.  Bone density of January 26, 2018: Normal.  Unchanged from 2012.  Assessment     Doing well post left breast wide excision and axillary node sampling.  Desire for breast symmetry surgery.    Plan  Ref to Dr. Marla Roe for evaluation of symmetry surgery.   Return in three months bilateral diagnotic mammogram .should the patient be considered a candidate for surgical intervention by plastics, mammogram should be completed before  that procedure.    The patient is aware to call back for any questions or concerns.    HPI, Physical Exam, Assessment and Plan have been scribed under the direction and in the presence of Hervey Ard, MD.  Gaspar Cola, CMA  Forest Gleason Hershy Flenner 05/14/2018, 10:52 AM  A referral has been made for the patient to see Dr. Audelia Hives regarding breast symmetry surgery. Their office will contact patient for an appointment.   Patient was placed in recalls for March 2020 for bilateral diagnostic mammogram and office visit follow up with Dr. Bary Castilla.   Dominga Ferry, CMA

## 2018-05-12 NOTE — Patient Instructions (Addendum)
Return in three months bilateral diagnotic mammogram . Ref to Dr. Marla Roe.  The patient is aware to call back for any questions or concerns.

## 2018-05-20 ENCOUNTER — Other Ambulatory Visit: Payer: Self-pay

## 2018-05-20 ENCOUNTER — Inpatient Hospital Stay: Payer: PPO | Attending: Oncology | Admitting: Oncology

## 2018-05-20 VITALS — BP 138/87 | HR 83 | Temp 98.3°F | Wt 199.2 lb

## 2018-05-20 DIAGNOSIS — Z79899 Other long term (current) drug therapy: Secondary | ICD-10-CM | POA: Insufficient documentation

## 2018-05-20 DIAGNOSIS — Z79811 Long term (current) use of aromatase inhibitors: Secondary | ICD-10-CM | POA: Insufficient documentation

## 2018-05-20 DIAGNOSIS — E559 Vitamin D deficiency, unspecified: Secondary | ICD-10-CM | POA: Insufficient documentation

## 2018-05-20 DIAGNOSIS — Z853 Personal history of malignant neoplasm of breast: Secondary | ICD-10-CM

## 2018-05-20 DIAGNOSIS — I10 Essential (primary) hypertension: Secondary | ICD-10-CM | POA: Diagnosis not present

## 2018-05-20 DIAGNOSIS — C50912 Malignant neoplasm of unspecified site of left female breast: Secondary | ICD-10-CM | POA: Diagnosis not present

## 2018-05-20 DIAGNOSIS — Z923 Personal history of irradiation: Secondary | ICD-10-CM | POA: Diagnosis not present

## 2018-05-20 DIAGNOSIS — Z170 Estrogen receptor positive status [ER+]: Secondary | ICD-10-CM | POA: Diagnosis not present

## 2018-05-20 DIAGNOSIS — Z9221 Personal history of antineoplastic chemotherapy: Secondary | ICD-10-CM | POA: Diagnosis not present

## 2018-05-20 DIAGNOSIS — Z08 Encounter for follow-up examination after completed treatment for malignant neoplasm: Secondary | ICD-10-CM

## 2018-05-23 ENCOUNTER — Encounter: Payer: Self-pay | Admitting: Oncology

## 2018-05-23 NOTE — Progress Notes (Signed)
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  She is currently on Aromasin   Chief complaint/ Reason for visit-routine follow-up of breast cancer  Heme/Onc history: patient is a 72 year old female with a prior history of right breast cancer in 2004s/p lumpectomyfollowed by [facility] X4 and XRT. T2N0M0.She thenn completed 5 years of aromasin until 2009.She could not tolerate arimidex.  Recent screening mammogram on 08/25/2017 showed a possible mass in her left breast. This was followed by an ultrasound and a diagnostic left mammogram which showed a 0.7 x 0.5 x 0.7 cm mass in the left breast. No evidence of left axillary adenopathy.  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 ER greater than 90% positive, PR greater than 90% positive and HER-2/neu negative   Patient has been seen by Dr. Camelia Phenes is scheduled to undergo surgery on 09/17/2017  She is G2P2L2. Menarche at the age of 59. Menopause in her early 55's. She used to take prempro from [age] yrs but none since then. She does have chronic back pain and tingling/pain in her extremities. Cervical spine MRI showed egenrative disease  Final pathology showed:Invasive mammary carcinoma 13 mm, grade 1 with associated DCIS. Negative margins. 0 out of 3 lymph nodes were positive for malignancy. 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  Aromasin started in August 2019   Interval history-patient is tolerating her Aromasin well and reports no significant side effects except for mild hot flashes.  She is also taking her calcium and vitamin D.  Her appetite is good and her weight is stable.  She denies any new aches or pains anywhere  ECOG PS- 0 Pain scale- 0 Opioid associated constipation- no want want it below anxiety below anxiety  Review of systems- Review of Systems  Constitutional: Negative for chills, fever, malaise/fatigue and weight loss.  HENT: Negative for congestion, ear discharge and nosebleeds.   Eyes: Negative for blurred vision.  Respiratory: Negative for cough, hemoptysis, sputum production, shortness of breath and wheezing.   Cardiovascular: Negative for chest pain, palpitations, orthopnea and claudication.  Gastrointestinal: Negative for abdominal pain, blood in stool, constipation, diarrhea, heartburn, melena, nausea and vomiting.  Genitourinary: Negative for dysuria, flank pain, frequency, hematuria and urgency.  Musculoskeletal: Negative for back pain, joint pain and myalgias.  Skin: Negative for rash.  Neurological: Negative for dizziness, tingling, focal weakness, seizures, weakness and headaches.  Endo/Heme/Allergies: Does not bruise/bleed easily.  Psychiatric/Behavioral: Negative for depression and suicidal ideas. The patient does not have insomnia.       Allergies  Allergen Reactions  . Lidocaine Palpitations  . Codeine Nausea Only and Nausea And Vomiting  . Morphine Nausea Only  . Niacin Rash  . Niacin And Related Rash  ANTIHYPERLIPEMICS     Past Medical History:  Diagnosis Date  . Anxiety   . Arthritis   . Asthma   .  Breast cancer ([place]) 2004   right breast  . Breast cancer of upper-outer quadrant of left female breast ([place]) 09/17/2017   T1c, N0, ER 90%, PR 90%, 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  . Cancer [facility]) 2004   right lumpectomy, chemotherapy and radiation Dr. Jeb Levering  . Depression   . GERD (gastroesophageal reflux disease)   . Hypertension   . Migraine   . Personal history of chemotherapy   . Personal history of radiation therapy   . PONV (postoperative nausea and vomiting)   . Shingles   . Sleep apnea    uses CPAP, severe OSA  . Vitamin D deficiency    IN THE PAST     Past Surgical History:  Procedure Laterality Date  . ABDOMINAL HYSTERECTOMY    . ANTERIOR AND POSTERIOR REPAIR    . BREAST BIOPSY Right    stereo prior to lumpectomy  . BREAST BIOPSY Left 09/07/2017   US guided biopsy  . 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  3.5 cm maximum diameter, minimal margins 5 mm.  In situ component less than 10%.  Marland Kitchen BREAST LUMPECTOMY Right 2004  . BREAST LUMPECTOMY Left 09/17/2017   Procedure: BREAST EXCISION, SENTINEL NODE BIOPSY;  Surgeon: Robert Bellow, MD;  Location: [facility] ORS;  Service: General;  Laterality: Left;  . COLONOSCOPY  2015  . ETHMOIDECTOMY Bilateral 11/05/2016   Procedure: ETHMOIDECTOMY;  Surgeon: Melida Quitter, MD;  Location: [place];  Service: ENT;  Laterality: Bilateral;  . MAXILLARY ANTROSTOMY Bilateral 11/05/2016   Procedure: MAXILLARY ANTROSTOMY;  Surgeon: Melida Quitter, MD;  Location: [place];  Service: ENT;  Laterality: Bilateral;  . SINUS ENDO W/FUSION Bilateral 11/05/2016   Procedure: FRONTAL RECESS EXPLORATION;  Surgeon: Melida Quitter, MD;  Location: [place];  Service: ENT;  Laterality: Bilateral;  BILATERAL ENDOSCOPIC SINUS SURGERY WITH FUSION  . SINUS EXPLORATION  11/05/2016  . SPHENOIDECTOMY Bilateral 11/05/2016   Procedure: SPHENOIDECTOMY;  Surgeon: Melida Quitter, MD;  Location: [facility] OR;  Service: ENT;   Laterality: Bilateral;    Social History   Socioeconomic History  . Marital status: Widowed    Spouse name: Not on file  . Number of children: Not on file  . Years of education: Not on file  . Highest education level: Not on file  Occupational History  . Not on file  Social Needs  . Financial resource strain: Not on file  . Food insecurity:    Worry: Not on file    Inability: Not on file  . Transportation needs:    Medical: Not on file    Non-medical: Not on file  Tobacco Use  . Smoking status: Never Smoker  . Smokeless tobacco: Never Used  Substance and Sexual Activity  . Alcohol use: Yes    Comment: rarely  . Drug use: No  . Sexual activity: Not on file  Lifestyle  . Physical activity:    Days per week: Not on file    Minutes per session: Not on file  . Stress: Not on file  Relationships  . Social connections:    Talks on phone: Not on file    Gets together: Not on file    Attends religious service: Not on file    Active member of club or organization: Not on file    Attends meetings  of clubs or organizations: Not on file    Relationship status: Not on file  . Intimate partner violence:    Fear of current or ex partner: Not on file    Emotionally abused: Not on file    Physically abused: Not on file    Forced sexual activity: Not on file  Other Topics Concern  . Not on file  Social History Narrative   Daily Caffeine Use:  1-2 coffee   Regular Exercise -  NO    Family History  Problem Relation Age of Onset  . Breast cancer Neg Hx      Current Outpatient Medications:  .  albuterol (PROVENTIL HFA;VENTOLIN HFA) 108 (90 Base) MCG/ACT inhaler, Inhale 2 puffs into the lungs every 6 (six) hours as needed., Disp: 1 Inhaler, Rfl: 3 .  ALPRAZolam (XANAX) 0.5 MG tablet, TAKE 1 TABLET BY MOUTH TWICE DAILY AS NEEDED FOR ANXIETY, Disp: 60 tablet, Rfl: 0 .  Azelastine-Fluticasone 137-50 MCG/ACT SUSP, Place 1 spray into the nose 2 (two) times daily., Disp: 23 g, Rfl:  4 .  celecoxib (CELEBREX) 100 MG capsule, Take 1 capsule (100 mg total) by mouth 2 (two) times daily., Disp: 180 capsule, Rfl: 1 .  docusate sodium (COLACE) 100 MG capsule, Take 1 tablet once or twice daily as needed for constipation while taking narcotic pain medicine, Disp: 30 capsule, Rfl: 0 .  EQ ALLERGY RELIEF, CETIRIZINE, 10 MG tablet, TAKE 1 TABLET BY MOUTH ONCE DAILY, Disp: 90 tablet, Rfl: 1 .  exemestane (AROMASIN) 25 MG tablet, TAKE 1 TABLET BY MOUTH ONCE DAILY AFTER  BREAKFAST, Disp: 30 tablet, Rfl: 1 .  hydrochlorothiazide (HYDRODIURIL) 12.5 MG tablet, Take 1 tablet (12.5 mg total) by mouth daily., Disp: 90 tablet, Rfl: 3 .  Melatonin 10 MG TABS, Take 1 tablet by mouth at bedtime., Disp: , Rfl:  .  Multiple Vitamin (MULTIVITAMIN) capsule, Take 1 capsule by mouth daily., Disp: , Rfl:  .  pantoprazole (PROTONIX) 40 MG tablet, TAKE 1 TABLET BY MOUTH ONCE DAILY, Disp: 90 tablet, Rfl: 2 .  rosuvastatin (CRESTOR) 20 MG tablet, TAKE 1 TABLET BY MOUTH ONCE DAILY, Disp: 90 tablet, Rfl: 3 .  traMADol (ULTRAM) 50 MG tablet, TAKE 1 TABLET BY MOUTH EVERY 6 HOURS AS NEEDED FOR MODERATE PAIN, Disp: 30 tablet, Rfl: 0 .  tretinoin (RETIN-A) 0.05 % cream, Apply 1 application topically at bedtime., Disp: 45 g, Rfl: 0 .  FLUoxetine (PROZAC) 20 MG capsule, TAKE THREE CAPSULES BY MOUTH ONCE DAILY (Patient not taking: Reported on 05/20/2018), Disp: 90 capsule, Rfl: 1  Physical exam:  Vitals:   05/20/18 0959  BP: 138/87  Pulse: 83  Temp: 98.3 F (36.8 C)  TempSrc: Tympanic  Weight: 199 lb 3.2 oz (90.4 kg)   Physical Exam HENT:     Head: Normocephalic and atraumatic.  Eyes:     Pupils: Pupils are equal, round, and reactive to light.  Neck:     Musculoskeletal: Normal range of motion.  Cardiovascular:     Rate and Rhythm: Normal rate and regular rhythm.     Heart sounds: Normal heart sounds.  Pulmonary:     Effort: Pulmonary effort is normal.     Breath sounds: Normal breath sounds.   Abdominal:     General: Bowel sounds are normal.     Palpations: Abdomen is soft.  Skin:    General: Skin is warm and dry.  Neurological:     Mental Status: She is alert and oriented to person,  place, and time.    Breast exam was performed in seated and lying down position. Patient is status post left lumpectomy with a well-healed surgical scar. No evidence of any palpable masses. No evidence of axillary adenopathy. No evidence of any palpable masses or lumps in the right breast. No evidence of right axillary adenopathy   CMP Latest Ref Rng & Units 02/15/2018  Glucose 70 - 99 mg/dL 108(H)  BUN 8 - 23 mg/dL 18  Creatinine 0.44 - 1.00 mg/dL 0.81  Sodium 135 - 145 mmol/L 140  Potassium 3.5 - 5.1 mmol/L 4.8  Chloride 98 - 111 mmol/L 101  CO2 22 - 32 mmol/L 28  Calcium 8.9 - 10.3 mg/dL 9.3  Total Protein 6.5 - 8.1 g/dL 7.3  Total Bilirubin 0.3 - 1.2 mg/dL 0.6  Alkaline Phos 38 - 126 U/L 60  AST 15 - 41 U/L 25  ALT 0 - 44 U/L 22   CBC Latest Ref Rng & Units 02/15/2018  WBC 3.6 - 11.0 K/uL 7.1  Hemoglobin 12.0 - 16.0 g/dL 12.4  Hematocrit 35.0 - 47.0 % 36.6  Platelets 150 - 440 K/uL 222      Assessment and plan- Patient is a 72 y.o. female withinvasive mammary carcinoma of the left breast stage IA pT1c pN0 cM0 ER PR positive and HER-2/neu negativestatus post lumpectomy and radiation treatment.  She is on Aromasin and is here for routine follow-up of breast cancer  Clinically patient is doing well and there is no evidence of recurrence on today's exam.  Patient will continue Aromasin along with calcium and vitamin D for 5 years.  She will be seeing radiation oncology in January and I will see her back in April 2020.  No labs   Visit Diagnosis 1. Encounter for follow-up surveillance of breast cancer   2. Use of exemestane (Aromasin)      Dr. Randa Evens, MD, MPH [facility] [phone] 05/23/2018 8:37 AM

## 2018-05-30 ENCOUNTER — Encounter

## 2018-05-30 ENCOUNTER — Encounter: Payer: Self-pay | Admitting: Family Medicine

## 2018-05-30 ENCOUNTER — Ambulatory Visit (INDEPENDENT_AMBULATORY_CARE_PROVIDER_SITE_OTHER): Payer: PPO | Admitting: Family Medicine

## 2018-05-30 ENCOUNTER — Telehealth: Payer: Self-pay

## 2018-05-30 VITALS — BP 138/90 | HR 73 | Temp 98.3°F | Ht 65.0 in | Wt 197.6 lb

## 2018-05-30 DIAGNOSIS — F419 Anxiety disorder, unspecified: Secondary | ICD-10-CM | POA: Diagnosis not present

## 2018-05-30 DIAGNOSIS — F32A Depression, unspecified: Secondary | ICD-10-CM

## 2018-05-30 DIAGNOSIS — M2550 Pain in unspecified joint: Secondary | ICD-10-CM

## 2018-05-30 DIAGNOSIS — R7989 Other specified abnormal findings of blood chemistry: Secondary | ICD-10-CM

## 2018-05-30 DIAGNOSIS — R7303 Prediabetes: Secondary | ICD-10-CM | POA: Diagnosis not present

## 2018-05-30 DIAGNOSIS — F329 Major depressive disorder, single episode, unspecified: Secondary | ICD-10-CM | POA: Diagnosis not present

## 2018-05-30 DIAGNOSIS — Z23 Encounter for immunization: Secondary | ICD-10-CM | POA: Diagnosis not present

## 2018-05-30 DIAGNOSIS — I10 Essential (primary) hypertension: Secondary | ICD-10-CM | POA: Diagnosis not present

## 2018-05-30 DIAGNOSIS — R0789 Other chest pain: Secondary | ICD-10-CM

## 2018-05-30 DIAGNOSIS — H938X3 Other specified disorders of ear, bilateral: Secondary | ICD-10-CM

## 2018-05-30 LAB — COMPREHENSIVE METABOLIC PANEL
ALK PHOS: 78 U/L (ref 39–117)
ALT: 18 U/L (ref 0–35)
AST: 18 U/L (ref 0–37)
Albumin: 4.3 g/dL (ref 3.5–5.2)
BUN: 13 mg/dL (ref 6–23)
CO2: 29 meq/L (ref 19–32)
Calcium: 9.6 mg/dL (ref 8.4–10.5)
Chloride: 102 mEq/L (ref 96–112)
Creatinine, Ser: 0.75 mg/dL (ref 0.40–1.20)
GFR: 81.01 mL/min (ref >60.00)
GLUCOSE: 94 mg/dL (ref 70–99)
POTASSIUM: 3.9 meq/L (ref 3.5–5.1)
SODIUM: 139 meq/L (ref 135–145)
TOTAL PROTEIN: 7.4 g/dL (ref 6.0–8.3)
Total Bilirubin: 0.5 mg/dL (ref 0.2–1.2)

## 2018-05-30 LAB — CBC
HEMATOCRIT: 38.6 % (ref 36.0–46.0)
Hemoglobin: 12.9 g/dL (ref 12.0–15.0)
MCHC: 33.3 g/dL (ref 30.0–36.0)
MCV: 93.6 fl (ref 78.0–100.0)
Platelets: 222 10*3/uL (ref 150.0–400.0)
RBC: 4.13 Mil/uL (ref 3.87–5.11)
RDW: 14.3 % (ref 11.5–15.5)
WBC: 7.6 10*3/uL (ref 4.0–10.5)

## 2018-05-30 LAB — HEMOGLOBIN A1C: Hgb A1c MFr Bld: 5.9 % (ref 4.6–6.5)

## 2018-05-30 LAB — SEDIMENTATION RATE: SED RATE: 31 mm/h — AB (ref 0–30)

## 2018-05-30 LAB — TSH: TSH: 6.7 u[IU]/mL — AB (ref 0.35–4.50)

## 2018-05-30 MED ORDER — ESCITALOPRAM OXALATE 10 MG PO TABS: 10.0000 mg | ORAL_TABLET | Freq: Every day | ORAL | 1 refills | Status: DC

## 2018-05-30 NOTE — Telephone Encounter (Signed)
Before pt left today from her OV she asked to have   Any of her prescriptions that are due for refills to be filled especially Retin-A and BP medication send to [facility] in [place] on [address].   Pt stated that she had Retin[place]A [place] and [place] it back this is why she needs this refilled sooner than expected. FYI.  Plus she wanted a referral to see a rheumatologist for her arthritis. Please call pt once Rx's have been sent and referral has been placed as requested by patient.   Thanks

## 2018-05-30 NOTE — Progress Notes (Signed)
[name], MD Phone: [phone]  Dana Obrien is a 72 y.o. female who presents today for follow-up.  CC: Hypertension, anxiety/depression, chronic joint pain, congestion  Hypertension: Typically 120s over 80s.  Taking HCTZ.  No shortness of breath or edema.  She does report some chest tightness when active.  Chest pain: Notes chest tightness when active.  This started 6 months ago.  Does not radiate.  Does not worsen with exertion.  She thinks it is related to her anxiety.  Anxiety/depression: Patient notes these certainly have worsened recently with her husband passing away previously and her being diagnosed with breast cancer.  She also stopped her Xanax about a month ago and that did not help as well.  She is on Prozac.  She notes no SI.  Chronic joint pain: Patient notes chronic back and joint pains that have been bothering her more recently.  Notes they have become worse since coming off of the tramadol that she stopped on her own.  The patient would like to see a rheumatologist for evaluation of her chronic joint pain.  Congestion: Patient notes both ears have been aching for 3 to 4 days.  She does note some off and on sinus congestion.  She notes no fevers.  She does note there is mold in her house.  Social History   Tobacco Use  Smoking Status Never Smoker  Smokeless Tobacco Never Used     ROS see history of present illness  Objective  Physical Exam Vitals:   05/30/18 0927 05/30/18 1033  BP: (!) 148/80 138/90  Pulse: 73   Temp: 98.3 F (36.8 C)   SpO2: 97%     BP Readings from Last 3 Encounters:  05/30/18 138/90  05/20/18 138/87  05/12/18 (!) 166/85   Wt Readings from Last 3 Encounters:  05/30/18 197 lb 9.6 oz (89.6 kg)  05/20/18 199 lb 3.2 oz (90.4 kg)  05/12/18 200 lb (90.7 kg)    Physical Exam Constitutional:      General: She is not in acute distress.    Appearance: She is not diaphoretic.  HENT:     Right Ear: Tympanic membrane  normal.     Left Ear: Tympanic membrane normal.     Ears:     Comments: Initially TMs obscured by cerumen, these were irrigated and this was tolerated well revealing normal TMs    Mouth/Throat:     Pharynx: Oropharynx is clear.  Cardiovascular:     Rate and Rhythm: Normal rate and regular rhythm.     Heart sounds: Normal heart sounds.  Pulmonary:     Effort: Pulmonary effort is normal.     Breath sounds: Normal breath sounds.  Skin:    General: Skin is warm and dry.  Neurological:     Mental Status: She is alert.    EKG: Normal sinus rhythm, rate 67, no ST or T wave changes  Assessment/Plan: Please see individual problem list.  Hypertension Well-controlled at home.  She will continue to monitor and continue her current medication.  Anxiety and depression Seems to have worsened.  We will transition her from Prozac to Lexapro.  She will discontinue the Prozac and then 1 week later start on the Lexapro.  Arthralgia Could be osteoarthritis though we will obtain lab work to evaluate for potential other causes.  Consider rheumatology referral depending on lab results.  Chest tightness This could be related to her anxiety though could be cardiac in nature given that it occurs when she  is active.  EKG is reassuring.  We will obtain lab work as outlined below and then consider cardiology evaluation.  Prediabetes Check A1c.  Congestion of both ears Encouraged Flonase use.   Orders Placed This Encounter  Procedures  . Flu vaccine HIGH DOSE PF (Fluzone High dose)  . CBC  . TSH  . Comp Met (CMET)  . HgB A1c  . Rheumatoid Factor  . Cyclic citrul peptide antibody, IgG ([facility])  . Antinuclear Antib (ANA)  . Sedimentation rate  . T4, free    Standing Status:   Future    Standing Expiration Date:   06/03/2019  . EKG 12-Lead   Elevated TSH noted on lab work.  We will have the patient return for free T4.  Meds ordered this encounter  Medications  . escitalopram (LEXAPRO) 10 MG  tablet    Sig: Take 1 tablet (10 mg total) by mouth daily.    Dispense:  90 tablet    Refill:  1    Discontinue prozac prescription, starting on lexapro     [name], MD [place]

## 2018-05-30 NOTE — Patient Instructions (Signed)
Nice to see you. We will get lab work today and contact you with the results. We are going to switch you from Prozac to Lexapro.  You will discontinue the Prozac and 1 week after stopping this you will start the Lexapro.  If you have persistent chest tightness or you develop shortness of breath or any new symptoms please be evaluated.  If you develop thoughts of harming yourself please go to the emergency room.

## 2018-05-31 ENCOUNTER — Encounter: Payer: Self-pay | Admitting: Family Medicine

## 2018-05-31 LAB — RHEUMATOID FACTOR: Rhuematoid fact SerPl-aCnc: <14 IU/mL (ref <14)

## 2018-05-31 LAB — ANA: ANA: NEGATIVE

## 2018-05-31 LAB — CYCLIC CITRUL PEPTIDE ANTIBODY, IGG: Cyclic Citrullin Peptide Ab: <16 UNITS

## 2018-05-31 MED ORDER — HYDROCHLOROTHIAZIDE 12.5 MG PO TABS: 12.5000 mg | ORAL_TABLET | Freq: Every day | ORAL | 3 refills | Status: DC

## 2018-05-31 MED ORDER — TRETINOIN 0.05 % EX CREA: 1.0000 "application " | TOPICAL_CREAM | Freq: Every day | CUTANEOUS | 0 refills | Status: AC

## 2018-05-31 NOTE — Telephone Encounter (Signed)
Called and spoke with patient. Pt advised and voiced understanding.  

## 2018-05-31 NOTE — Telephone Encounter (Signed)
Refill sent to pharmacy.  I am going to await her lab results prior to placing a referral to rheumatology.  Once her labs return we will determine if a rheumatology evaluation is necessary.

## 2018-05-31 NOTE — Addendum Note (Signed)
Addended by: Caryl Bis, Elsworth Ledin G on: 05/31/2018 10:26 AM   Modules accepted: Orders

## 2018-06-02 DIAGNOSIS — R0789 Other chest pain: Secondary | ICD-10-CM | POA: Insufficient documentation

## 2018-06-02 DIAGNOSIS — R7303 Prediabetes: Secondary | ICD-10-CM | POA: Insufficient documentation

## 2018-06-02 DIAGNOSIS — M2550 Pain in unspecified joint: Secondary | ICD-10-CM | POA: Insufficient documentation

## 2018-06-02 DIAGNOSIS — H938X3 Other specified disorders of ear, bilateral: Secondary | ICD-10-CM | POA: Insufficient documentation

## 2018-06-02 NOTE — Assessment & Plan Note (Signed)
Encouraged Flonase use.

## 2018-06-02 NOTE — Assessment & Plan Note (Addendum)
Seems to have worsened.  We will transition her from Prozac to Lexapro.  She will discontinue the Prozac and then 1 week later start on the Lexapro.

## 2018-06-02 NOTE — Assessment & Plan Note (Signed)
Check A1c. 

## 2018-06-02 NOTE — Assessment & Plan Note (Signed)
This could be related to her anxiety though could be cardiac in nature given that it occurs when she is active.  EKG is reassuring.  We will obtain lab work as outlined below and then consider cardiology evaluation.

## 2018-06-02 NOTE — Assessment & Plan Note (Signed)
Could be osteoarthritis though we will obtain lab work to evaluate for potential other causes.  Consider rheumatology referral depending on lab results.

## 2018-06-02 NOTE — Assessment & Plan Note (Signed)
Well-controlled at home.  She will continue to monitor and continue her current medication.

## 2018-06-03 ENCOUNTER — Encounter: Payer: Self-pay | Admitting: Family Medicine

## 2018-06-04 ENCOUNTER — Other Ambulatory Visit: Payer: Self-pay | Admitting: Family Medicine

## 2018-06-06 ENCOUNTER — Ambulatory Visit: Payer: PPO | Admitting: Radiation Oncology

## 2018-06-07 ENCOUNTER — Telehealth: Payer: Self-pay | Admitting: Family Medicine

## 2018-06-07 NOTE — Telephone Encounter (Signed)
Pt returned call.  Copied from [place] [id number]. Topic: Quick Communication - Lab Results (Clinic Use ONLY) >> Jun 07, 2018 10:40 AM Myriam Forehand, [place] wrote: Called patient to inform them of  lab results. When patient returns call, triage nurse may disclose results.

## 2018-06-07 NOTE — Telephone Encounter (Signed)
Attempted CB, left message.

## 2018-06-08 ENCOUNTER — Other Ambulatory Visit (INDEPENDENT_AMBULATORY_CARE_PROVIDER_SITE_OTHER): Payer: PPO

## 2018-06-08 DIAGNOSIS — R7989 Other specified abnormal findings of blood chemistry: Secondary | ICD-10-CM

## 2018-06-08 LAB — T4, FREE: Free T4: 0.85 ng/dL (ref 0.60–1.60)

## 2018-06-08 NOTE — Telephone Encounter (Signed)
You should have a message from Catie  In regards to options.   Pt stated that she is no longer taking xanax and she has not started the Lexapro yet.   Called and spoke with patient in regards to Catie's suggestion sent to PCP to advise what he feels woul dbe best for the patient.   Thanks

## 2018-06-08 NOTE — Telephone Encounter (Signed)
See phone message

## 2018-06-08 NOTE — Telephone Encounter (Signed)
Pt came in for labs and wanted a call back from [place]. Pt best phone number is [phone]

## 2018-06-08 NOTE — Telephone Encounter (Signed)
I have reviewed our clinical pharmacist's documentation.  I think it would be reasonable to have the patient start on Cymbalta to see if that would help with her pain and anxiety/depression.  If the patient is willing to do this I can send it to her pharmacy.  Please contact the pharmacy and cancel the Lexapro prescription.  Thanks.

## 2018-06-09 MED ORDER — PANTOPRAZOLE SODIUM 40 MG PO TBEC: 40.0000 mg | DELAYED_RELEASE_TABLET | Freq: Every day | ORAL | 2 refills | Status: DC

## 2018-06-09 MED ORDER — DULOXETINE HCL 30 MG PO CPEP: ORAL_CAPSULE | ORAL | 3 refills | Status: DC

## 2018-06-09 NOTE — Telephone Encounter (Signed)
Pt advised and voiced understanding. Pt would like to try the Cymbalta. Pt stated that she needs a refill as well on her PROTONIX.   Once this has been done I call pharmacy to remove lexapro.

## 2018-06-09 NOTE — Telephone Encounter (Signed)
Sent to pharmacy 

## 2018-06-10 NOTE — Telephone Encounter (Signed)
Called patient and left a detailed VM that BOTH prescriptions have been sent into her pharmacy and call us back if needed.

## 2018-06-12 ENCOUNTER — Other Ambulatory Visit: Payer: Self-pay | Admitting: Family Medicine

## 2018-06-12 DIAGNOSIS — E039 Hypothyroidism, unspecified: Secondary | ICD-10-CM

## 2018-06-12 DIAGNOSIS — E038 Other specified hypothyroidism: Secondary | ICD-10-CM

## 2018-06-13 ENCOUNTER — Other Ambulatory Visit: Payer: Self-pay

## 2018-06-13 ENCOUNTER — Other Ambulatory Visit: Payer: Self-pay | Admitting: Oncology

## 2018-06-13 ENCOUNTER — Encounter: Payer: Self-pay | Admitting: Radiation Oncology

## 2018-06-13 ENCOUNTER — Ambulatory Visit
Admission: RE | Admit: 2018-06-13 | Discharge: 2018-06-13 | Disposition: A | Discharge status: Home / Self Care | Payer: PPO | Source: Ambulatory Visit | Attending: Radiation Oncology | Admitting: Radiation Oncology

## 2018-06-13 VITALS — BP 151/83 | HR 66 | Temp 98.4°F | Resp 16 | Wt 198.2 lb

## 2018-06-13 DIAGNOSIS — Z79811 Long term (current) use of aromatase inhibitors: Secondary | ICD-10-CM | POA: Insufficient documentation

## 2018-06-13 DIAGNOSIS — C50412 Malignant neoplasm of upper-outer quadrant of left female breast: Secondary | ICD-10-CM | POA: Diagnosis not present

## 2018-06-13 DIAGNOSIS — Z923 Personal history of irradiation: Secondary | ICD-10-CM | POA: Diagnosis not present

## 2018-06-13 DIAGNOSIS — Z170 Estrogen receptor positive status [ER+]: Secondary | ICD-10-CM | POA: Insufficient documentation

## 2018-06-13 DIAGNOSIS — L989 Disorder of the skin and subcutaneous tissue, unspecified: Secondary | ICD-10-CM | POA: Diagnosis not present

## 2018-06-13 NOTE — Progress Notes (Signed)
Radiation Oncology Follow up Note  Name: Dana Obrien   Date:   06/13/2018 MRN:  [id number] DOB: 03/22/1946    This 73 y.o. female presents to the clinic today for 6 month follow-up status post whole breast radiation to her left breast for ER/PR positive invasive mammary carcinoma.Marland Kitchen  REFERRING PROVIDER: Leone Haven, MD  HPI: patient is a 73 year old female now 6 months out having completed whole breast radiation to her left breast for ER/PR positive HER-2/neu negative invasive mammary carcinoma. Seen today in routine follow up she is doing well. Surgeon's on a small erythematous lesion on her anterior chest which she's going to see the dermatologist for looks like it could be [place] early skin cancer amenable to easy eradication. She's not yet had a follow-up mammogram. She's currently on Aromasin tolerate that well without side effect..  COMPLICATIONS OF TREATMENT: none  FOLLOW UP COMPLIANCE: keeps appointments   PHYSICAL EXAM:  BP (!) 151/83 (BP Location: Left Arm, Patient Position: Sitting)   Pulse 66   Temp 98.4 F (36.9 C) (Tympanic)   Resp 16   Wt 198 lb 3.1 oz (89.9 kg)   BMI 32.98 kg/m  Lungs are clear to A&P cardiac examination essentially unremarkable with regular rate and rhythm. No dominant mass or nodularity is noted in either breast in 2 positions examined. Incision is well-healed. No axillary or supraclavicular adenopathy is appreciated. Cosmetic result is excellent.she does have awell-circumscribed soft mass in her left breast consistent with prior surgery unchanged from prior exam.Well-developed well-nourished patient in NAD. HEENT reveals PERLA, EOMI, discs not visualized.  Oral cavity is clear. No oral mucosal lesions are identified. Neck is clear without evidence of cervical or supraclavicular adenopathy. Lungs are clear to A&P. Cardiac examination is essentially unremarkable with regular rate and rhythm without murmur rub or thrill. Abdomen is benign with no  organomegaly or masses noted. Motor sensory and DTR levels are equal and symmetric in the upper and lower extremities. Cranial nerves II through XII are grossly intact. Proprioception is intact. No peripheral adenopathy or edema is identified. No motor or sensory levels are noted. Crude visual fields are within normal range.  RADIOLOGY RESULTS: no current films to review  PLAN: present time patient is doing well 6 months out from whole breast radiation. I'm please were overall progress. I've asked to see her back in 6 months for follow-up. Patient is to call sooner with any concerns. She continues close follow-up care with surgeon. Patient is to call with any concerns at any time.  I would like to take this opportunity to thank you for allowing me to participate in the care of your patient.Noreene Filbert, MD

## 2018-06-16 ENCOUNTER — Telehealth: Payer: Self-pay | Admitting: *Deleted

## 2018-06-16 NOTE — Telephone Encounter (Signed)
Called pt and and left a VM to call back.

## 2018-06-16 NOTE — Telephone Encounter (Signed)
Copied from [place] [id number]. Topic: General - Other >> Jun 16, 2018 11:48 AM Sheran Luz wrote: Patient requesting call back from [place]. Advised patient she could speak with triage nurse regarding lab results, she declined and stated she will wait for [facility] to call her back. Please advise.

## 2018-06-20 ENCOUNTER — Telehealth: Payer: Self-pay

## 2018-06-20 NOTE — Telephone Encounter (Signed)
I think it is reasonable to hold off on the Cymbalta for now as she is not feeling depressed.  If she develops depression she needs to let us know.

## 2018-06-20 NOTE — Telephone Encounter (Signed)
Pt stated that she has stopped taking the xanax and the Prozac and has not felt depressed she has not started the Cymbalta yet however, she has been taking the tramadol as needed for pain.   Pt wanted to know is it ok to hold off on the Cymbalta for now since she isn't feeling depressed?

## 2018-07-11 ENCOUNTER — Other Ambulatory Visit: Payer: Self-pay

## 2018-07-11 DIAGNOSIS — Z170 Estrogen receptor positive status [ER+]: Principal | ICD-10-CM

## 2018-07-11 DIAGNOSIS — C50412 Malignant neoplasm of upper-outer quadrant of left female breast: Secondary | ICD-10-CM

## 2018-07-21 IMAGING — US US ABDOMEN LIMITED
1 series · 14 of 25 positions shown · non-contrast
Comparison: CT 06/30/2017

CLINICAL DATA: Right side abdominal pain for 2-3 days.  Nausea

EXAM:
ULTRASOUND ABDOMEN LIMITED RIGHT UPPER QUADRANT

[Series 1: us abdomen limited · 0.22mm/px · 14 of 46 slices shown]
[im 1/46]
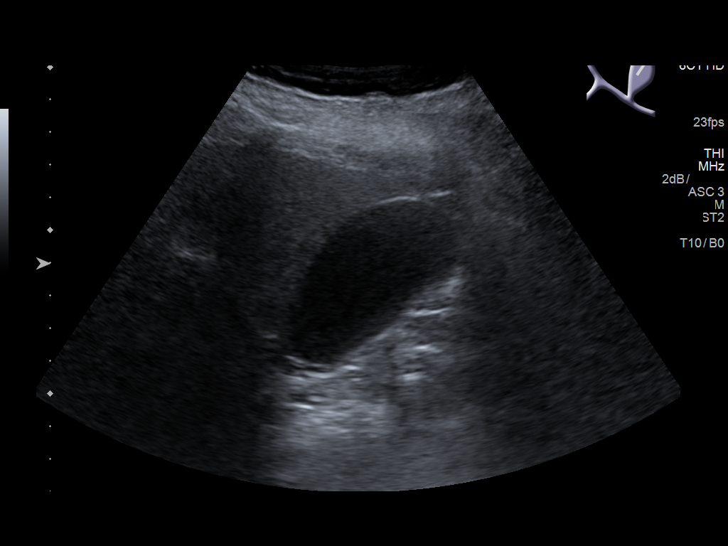
[im 4/46]
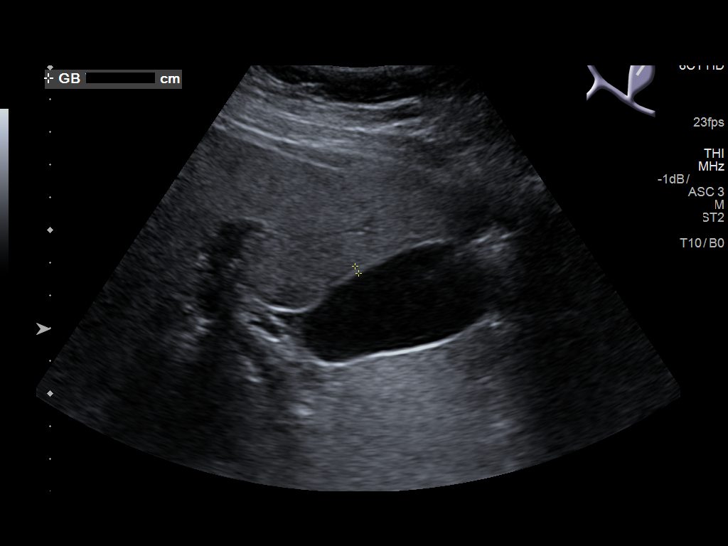
[im 8/46]
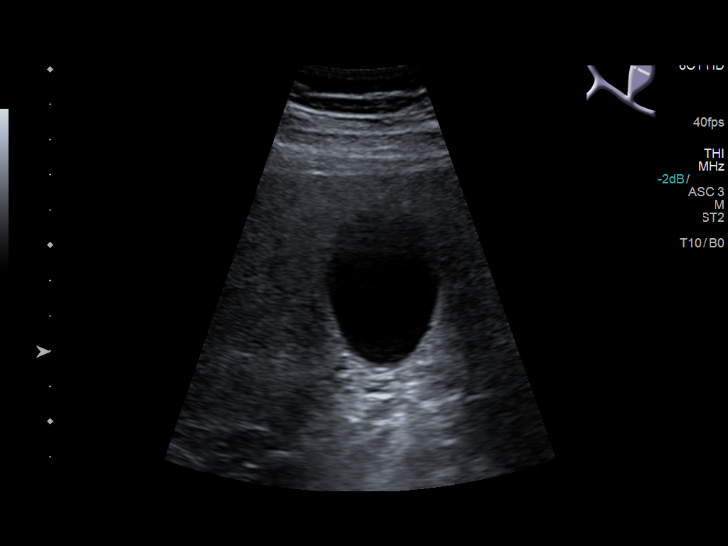
[im 12/46]
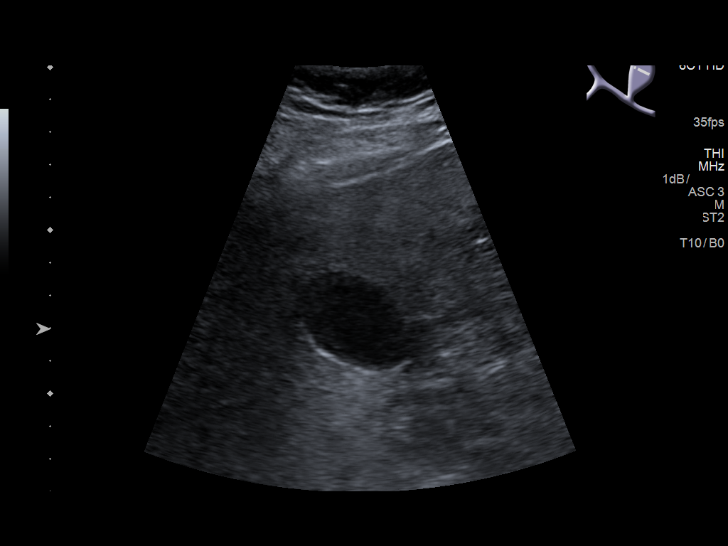
[im 16/46]
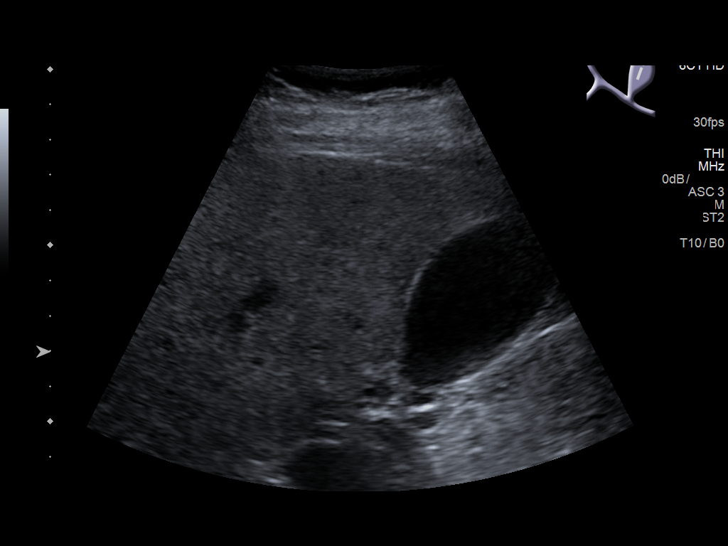
[im 17/46]
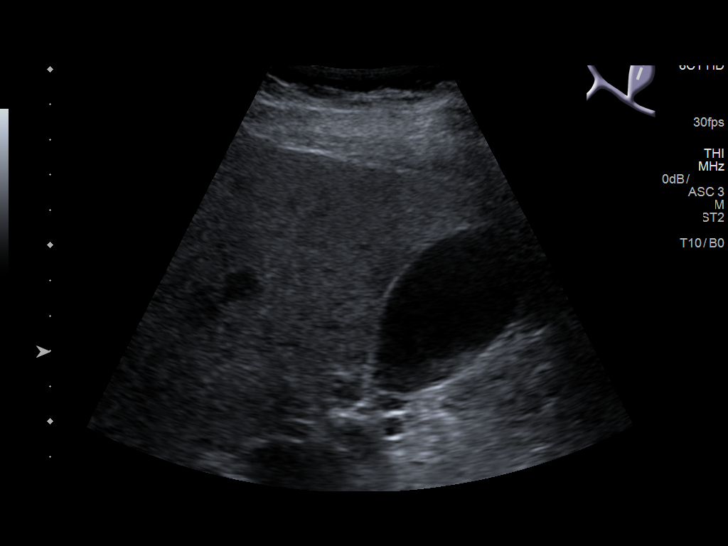
[im 21/46]
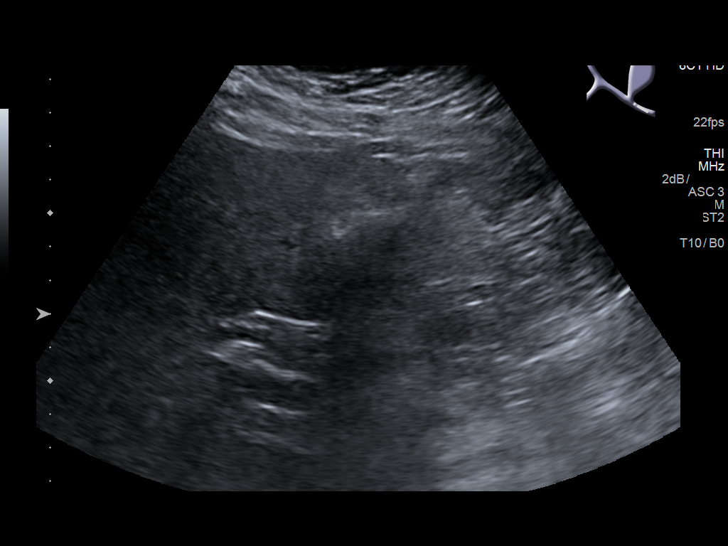
[im 25/46]
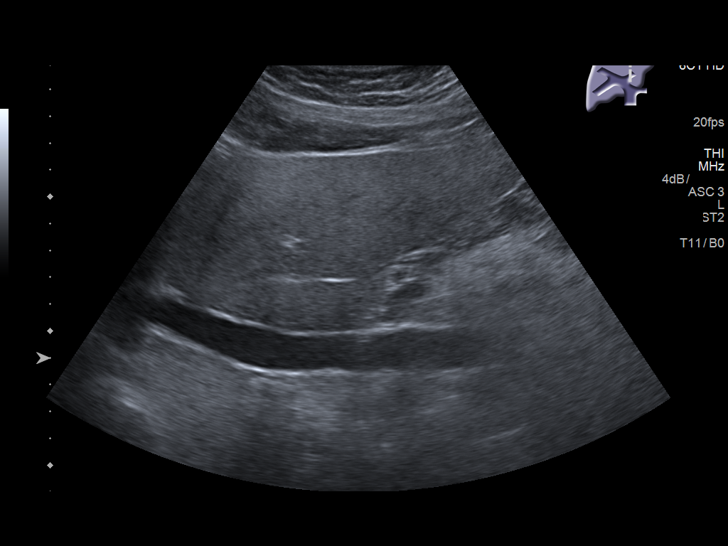
[im 29/46]
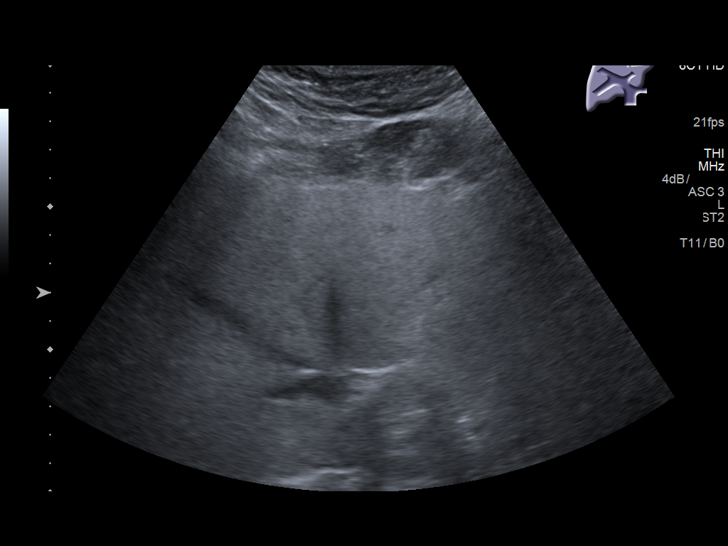
[im 31/46]
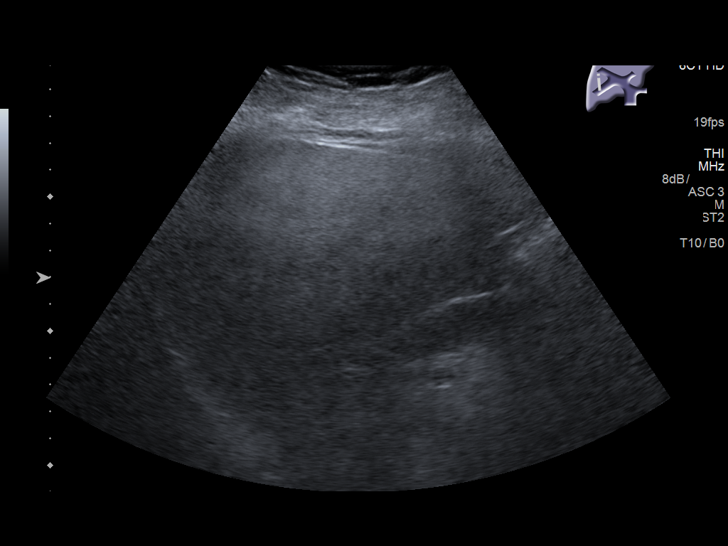
[im 34/46]
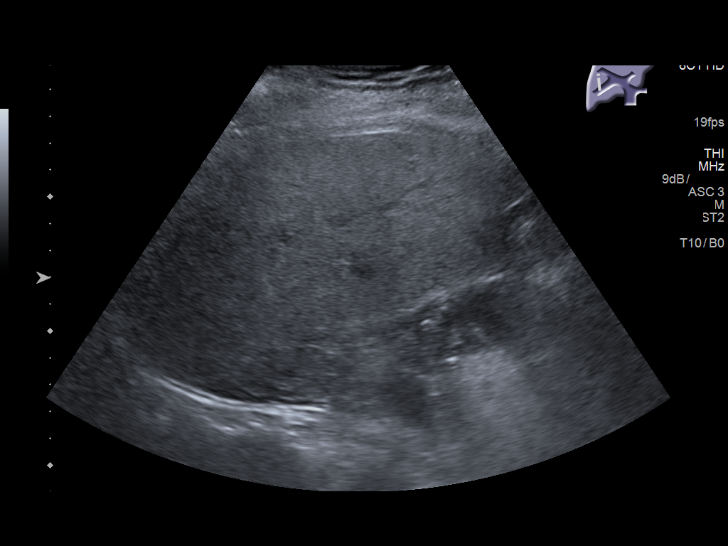
[im 38/46]
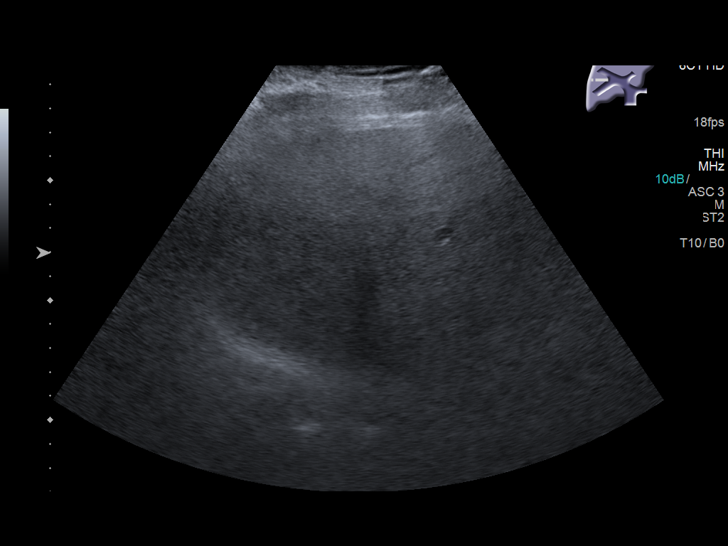
[im 42/46]
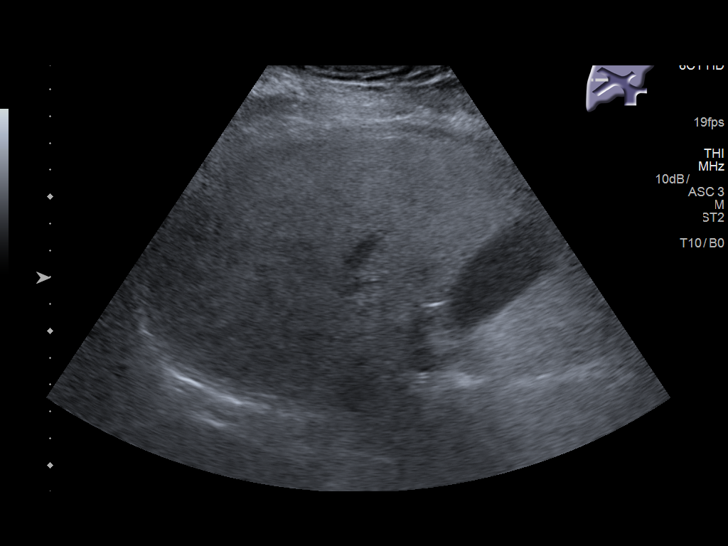
[im 46/46]
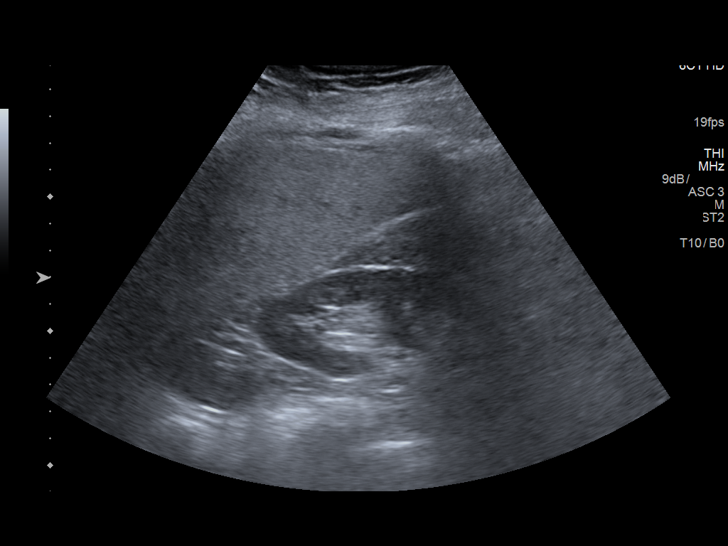

[14 of 25 positions shown; findings below may reference images not displayed]

FINDINGS: Gallbladder:

No gallstones or wall thickening visualized. No sonographic Murphy
sign noted by sonographer.

Common bile duct:

Diameter: Normal caliber, 4 mm.

Liver:

Increased echotexture compatible with fatty infiltration. No focal
abnormality or biliary ductal dilatation. Portal vein is patent on
color Doppler imaging with normal direction of blood flow towards
the liver.
IMPRESSION: Fatty infiltration of the liver.  No acute findings.

## 2018-07-25 ENCOUNTER — Other Ambulatory Visit: Payer: Self-pay | Admitting: Oncology

## 2018-07-26 DIAGNOSIS — D2261 Melanocytic nevi of right upper limb, including shoulder: Secondary | ICD-10-CM | POA: Diagnosis not present

## 2018-07-26 DIAGNOSIS — D485 Neoplasm of uncertain behavior of skin: Secondary | ICD-10-CM | POA: Diagnosis not present

## 2018-07-26 DIAGNOSIS — L821 Other seborrheic keratosis: Secondary | ICD-10-CM | POA: Diagnosis not present

## 2018-07-26 DIAGNOSIS — D2271 Melanocytic nevi of right lower limb, including hip: Secondary | ICD-10-CM | POA: Diagnosis not present

## 2018-07-26 DIAGNOSIS — D2272 Melanocytic nevi of left lower limb, including hip: Secondary | ICD-10-CM | POA: Diagnosis not present

## 2018-07-26 DIAGNOSIS — L570 Actinic keratosis: Secondary | ICD-10-CM | POA: Diagnosis not present

## 2018-07-26 DIAGNOSIS — L565 Disseminated superficial actinic porokeratosis (DSAP): Secondary | ICD-10-CM | POA: Diagnosis not present

## 2018-07-26 DIAGNOSIS — X32XXXA Exposure to sunlight, initial encounter: Secondary | ICD-10-CM | POA: Diagnosis not present

## 2018-07-26 DIAGNOSIS — D225 Melanocytic nevi of trunk: Secondary | ICD-10-CM | POA: Diagnosis not present

## 2018-07-26 DIAGNOSIS — D2262 Melanocytic nevi of left upper limb, including shoulder: Secondary | ICD-10-CM | POA: Diagnosis not present

## 2018-08-09 ENCOUNTER — Ambulatory Visit (INDEPENDENT_AMBULATORY_CARE_PROVIDER_SITE_OTHER): Payer: PPO

## 2018-08-09 ENCOUNTER — Ambulatory Visit (INDEPENDENT_AMBULATORY_CARE_PROVIDER_SITE_OTHER): Payer: PPO | Admitting: Family Medicine

## 2018-08-09 ENCOUNTER — Encounter: Payer: Self-pay | Admitting: Family Medicine

## 2018-08-09 VITALS — BP 120/82 | HR 77 | Temp 98.0°F | Ht 65.0 in | Wt 196.0 lb

## 2018-08-09 VITALS — BP 120/82 | HR 77 | Temp 98.0°F | Resp 16 | Ht 65.0 in | Wt 196.0 lb

## 2018-08-09 DIAGNOSIS — Z0000 Encounter for general adult medical examination without abnormal findings: Secondary | ICD-10-CM

## 2018-08-09 DIAGNOSIS — R5383 Other fatigue: Secondary | ICD-10-CM | POA: Diagnosis not present

## 2018-08-09 DIAGNOSIS — F32A Depression, unspecified: Secondary | ICD-10-CM

## 2018-08-09 DIAGNOSIS — Z1159 Encounter for screening for other viral diseases: Secondary | ICD-10-CM | POA: Diagnosis not present

## 2018-08-09 DIAGNOSIS — M2550 Pain in unspecified joint: Secondary | ICD-10-CM | POA: Diagnosis not present

## 2018-08-09 DIAGNOSIS — F329 Major depressive disorder, single episode, unspecified: Secondary | ICD-10-CM | POA: Diagnosis not present

## 2018-08-09 DIAGNOSIS — H9193 Unspecified hearing loss, bilateral: Secondary | ICD-10-CM

## 2018-08-09 DIAGNOSIS — F419 Anxiety disorder, unspecified: Secondary | ICD-10-CM | POA: Diagnosis not present

## 2018-08-09 LAB — BASIC METABOLIC PANEL
BUN: 16 mg/dL (ref 6–23)
CO2: 28 mEq/L (ref 19–32)
Calcium: 9.9 mg/dL (ref 8.4–10.5)
Chloride: 102 mEq/L (ref 96–112)
Creatinine, Ser: 0.8 mg/dL (ref 0.40–1.20)
GFR: 70.71 mL/min (ref >60.00)
Glucose, Bld: 99 mg/dL (ref 70–99)
POTASSIUM: 4.3 meq/L (ref 3.5–5.1)
SODIUM: 141 meq/L (ref 135–145)

## 2018-08-09 LAB — T4, FREE: Free T4: 0.94 ng/dL (ref 0.60–1.60)

## 2018-08-09 LAB — TSH: TSH: 4.99 u[IU]/mL — AB (ref 0.35–4.50)

## 2018-08-09 MED ORDER — TETANUS-DIPHTH-ACELL PERTUSSIS 5-2.5-18.5 LF-MCG/0.5 IM SUSP: 0.5000 mL | Freq: Once | INTRAMUSCULAR | 0 refills | Status: AC

## 2018-08-09 NOTE — Progress Notes (Signed)
Subjective:   Dana Obrien is a 73 y.o. female who presents for Medicare Annual (Subsequent) preventive examination.  Review of Systems:  No ROS.  Medicare Wellness Visit. Additional risk factors are reflected in the social history. Cardiac Risk Factors include: advanced age (>47mn, >>64women);hypertension     Objective:     Vitals: BP 120/82 (BP Location: Right Arm, Patient Position: Sitting, Cuff Size: Normal)   Pulse 77   Temp 98 F (36.7 C) (Oral)   Resp 16   Ht 5' 5"  (1.651 m)   Wt 196 lb (88.9 kg)   SpO2 94%   BMI 32.62 kg/m   Body mass index is 32.62 kg/m.  Advanced Directives 08/09/2018 06/13/2018 05/20/2018 02/15/2018 01/03/2018 11/18/2017 10/05/2017  Does Patient Have a Medical Advance Directive? No No No No No No No  Would patient like information on creating a medical advance directive? No - Patient declined No - Patient declined No - Patient declined No - Patient declined No - Patient declined No - Patient declined No - Patient declined    Tobacco Social History   Tobacco Use  Smoking Status Never Smoker  Smokeless Tobacco Never Used     Counseling given: Not Answered   Clinical Intake:  Pre-visit preparation completed: Yes           How often do you need to have someone help you when you read instructions, pamphlets, or other written materials from your doctor or pharmacy?: 1 - Never  Interpreter Needed?: No     Past Medical History:  Diagnosis Date  . Anxiety   . Arthritis   . Asthma   . Breast cancer ([facility] 2004   right breast  . Breast cancer of upper-outer quadrant of left female breast ([facility] 09/17/2017   T1c, N0, ER 90%, PR 90%, 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  . Cancer ([facility] 2004   right lumpectomy, chemotherapy and radiation Dr. CJeb Levering . Depression   . GERD (gastroesophageal reflux disease)   . Hypertension   . Migraine   . Personal history of chemotherapy   . Personal history of radiation therapy   . PONV (postoperative  nausea and vomiting)   . Shingles   . Sleep apnea    uses CPAP, severe OSA  . Vitamin D deficiency    IN THE PAST   Past Surgical History:  Procedure Laterality Date  . ABDOMINAL HYSTERECTOMY    . ANTERIOR AND POSTERIOR REPAIR    . BREAST BIOPSY Right    stereo prior to lumpectomy  . BREAST BIOPSY Left 09/07/2017   [place]  . 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  3.5 cm maximum diameter, minimal margins 5 mm.  In situ component less than 10%.  .Marland KitchenBREAST LUMPECTOMY Right 2004  . BREAST LUMPECTOMY Left 09/17/2017   Procedure: BREAST EXCISION, SENTINEL NODE BIOPSY;  Surgeon: BRobert Bellow MD;  Location: [facility] ORS;  Service: General;  Laterality: Left;  . COLONOSCOPY  2015  . ETHMOIDECTOMY Bilateral 11/05/2016   Procedure: ETHMOIDECTOMY;  Surgeon: BMelida Quitter MD;  [place] [facility]  Service: ENT;  Laterality: Bilateral;  . MAXILLARY ANTROSTOMY Bilateral 11/05/2016   Procedure: MAXILLARY ANTROSTOMY;  Surgeon: BMelida Quitter MD;  [place] [facility]  Service: ENT;  Laterality: Bilateral;  . SINUS ENDO W/FUSION Bilateral 11/05/2016   Procedure: FRONTAL RECESS EXPLORATION;  Surgeon: BMelida Quitter MD;  [place] [facility]  Service: ENT;  Laterality: Bilateral;  BILATERAL ENDOSCOPIC  SINUS SURGERY WITH FUSION  . SINUS EXPLORATION  11/05/2016  . SPHENOIDECTOMY Bilateral 11/05/2016   Procedure: SPHENOIDECTOMY;  Surgeon: Melida Quitter, MD;  Location: [facility] OR;  Service: ENT;  Laterality: Bilateral;   Family History  Problem Relation Age of Onset  . Alzheimer's disease Sister   . Alzheimer's disease Brother   . Breast cancer Neg Hx    Social History   Socioeconomic History  . Marital status: Widowed    Spouse name: Not on file  . Number of children: Not on file  . Years of education: Not on file  . Highest education level: Not on file  Occupational History  . Not on file  Social Needs  . Financial resource strain:  Not hard at all  . Food insecurity:    Worry: Never true    Inability: Never true  . Transportation needs:    Medical: No    Non-medical: No  Tobacco Use  . Smoking status: Never Smoker  . Smokeless tobacco: Never Used  Substance and Sexual Activity  . Alcohol use: Not Currently    Comment: rarely  . Drug use: No  . Sexual activity: Not on file  Lifestyle  . Physical activity:    Days per week: 0 days    Minutes per session: 0 min  . Stress: Not at all  Relationships  . Social connections:    Talks on phone: Not on file    Gets together: Not on file    Attends religious service: Not on file    Active member of club or organization: Not on file    Attends meetings of clubs or organizations: Not on file    Relationship status: Not on file  Other Topics Concern  . Not on file  Social History Narrative   Daily Caffeine Use:  1-2 coffee   Regular Exercise -  NO    Outpatient Encounter Medications as of 08/09/2018  Medication Sig  . albuterol (PROVENTIL HFA;VENTOLIN HFA) 108 (90 Base) MCG/ACT inhaler Inhale 2 puffs into the lungs every 6 (six) hours as needed.  . celecoxib (CELEBREX) 100 MG capsule Take 1 capsule (100 mg total) by mouth 2 (two) times daily.  . DULoxetine (CYMBALTA) 30 MG capsule Take 1 capsule (30 mg) by mouth daily for 7 days, then increase to 2 capsules (60 mg) by mouth daily  . EQ ALLERGY RELIEF, CETIRIZINE, 10 MG tablet TAKE 1 TABLET BY MOUTH ONCE DAILY  . exemestane (AROMASIN) 25 MG tablet TAKE 1 TABLET BY MOUTH ONCE DAILY AFTER BREAKFAST  . fluticasone (FLONASE) 50 MCG/ACT nasal spray Place into both nostrils daily.  . hydrochlorothiazide (HYDRODIURIL) 12.5 MG tablet Take 1 tablet (12.5 mg total) by mouth daily.  . pantoprazole (PROTONIX) 40 MG tablet Take 1 tablet (40 mg total) by mouth daily.  . rosuvastatin (CRESTOR) 20 MG tablet TAKE 1 TABLET BY MOUTH ONCE DAILY  . senna (SENOKOT) 8.6 MG TABS tablet Take 1 tablet by mouth.  . tretinoin (RETIN-A) 0.05  % cream Apply 1 application topically at bedtime.   No facility-administered encounter medications on file as of 08/09/2018.     Activities of Daily Living In your present state of health, do you have any difficulty performing the following activities: 08/09/2018  Hearing? N  Vision? N  Difficulty concentrating or making decisions? N  Walking or climbing stairs? N  Dressing or bathing? N  Doing errands, shopping? N  Preparing Food and eating ? N  Using the Toilet? N  In  the past six months, have you accidently leaked urine? N  Do you have problems with loss of bowel control? N  Managing your Medications? N  Managing your Finances? N  Housekeeping or managing your Housekeeping? N  Some recent data might be hidden    Patient Care Team: Leone Haven, MD as PCP - General (Family Medicine)    Assessment:   This is a routine wellness examination for [place].  Health Screenings  Mammogram -08/25/17 Colonoscopy -06/27/13 Bone Density -01/26/18 Glaucoma -none Hearing- hearing aids Hemoglobin A1C -05/30/18 Dental- every 6 months Vision- every 12 months  Social  Alcohol intake -no Smoking history- never Smokers in home?none Illicit drug use?none Exercise -walking, as tolerated Diet -regular  Safety  Patient feels safe at home.  Patient does have smoke detectors at home  Patient does wear sunscreen or protective clothing when in direct sunlight. Patient does wear seat belt when driving or riding with others.  Daughter and grandson live in the home with her.   Activities of Daily Living Patient can do their own household chores. Denies needing assistance with: driving, feeding themselves, getting from bed to chair, getting to the toilet, bathing/showering, dressing, managing money, climbing flight of stairs, or preparing meals.   Depression Screen Patient denies losing interest in daily life, feeling hopeless, or crying easily over simple problems. Taking medication as  directed.  Fall Screen Patient denies being afraid of falling or falling in the last year.   Memory Screen Patient denies problems with memory, misplacing items, and is able to balance checkbook/bank accounts.  Patient is alert, normal appearance, oriented to person/place/and time. Correctly identified the president of the [place], recall of 2/3 objects, and performing simple calculations.  Patient displays appropriate judgement and can read correct time from watch face.   Immunizations The following Immunizations are up to date: Influenza, pneumonia. Shingles and Tdap discussed.   Other Providers Patient Care Team: Leone Haven, MD as PCP - General (Family Medicine)  Exercise Activities and Dietary recommendations Current Exercise Habits: The patient does not participate in regular exercise at present  Goals      Patient Stated   . Increase physical activity (pt-stated)     Walk for exercise as tolerated.       Fall Risk Fall Risk  08/09/2018 05/30/2018 05/12/2018 12/09/2016 11/01/2015  Falls in the past year? 0 0 0 No No  Number falls in past yr: 0 0 - - -  Injury with Fall? 0 0 - - -   Depression Screen PHQ 2/9 Scores 08/09/2018 06/02/2018 05/30/2018 12/09/2016  PHQ - 2 Score 0 2 2 1   PHQ- 9 Score - 14 - 2     Cognitive Function     6CIT Screen 08/09/2018  What Year? 0 points  What month? 0 points  What time? 0 points  Count back from 20 0 points  Months in reverse 0 points  Repeat phrase 0 points  Total Score 0    Immunization History  Administered Date(s) Administered  . Influenza Whole 06/04/2008  . Influenza, High Dose Seasonal PF 08/11/2017, 05/30/2018  . Influenza,inj,Quad PF,6+ Mos 03/06/2014  . Influenza-Unspecified 03/21/2012, 03/01/2013, 04/30/2015  . Pneumococcal Conjugate-13 10/25/2013  . Pneumococcal Polysaccharide-23 10/19/2012  . Td 06/01/2005   Screening Tests Health Maintenance  Topic Date Due  . Hepatitis C Screening  11-04-1945  .  TETANUS/TDAP  06/02/2015  . MAMMOGRAM  08/26/2019  . COLONOSCOPY  06/28/2023  . INFLUENZA VACCINE  Completed  . DEXA  SCAN  Completed      Plan:    End of life planning; Advanced aging; Advanced directives discussed.  No HCPOA/Living Will.  Additional information declined at this time.  I have personally reviewed and noted the following in the patient's chart:   . Medical and social history . Use of alcohol, tobacco or illicit drugs  . Current medications and supplements . Functional ability and status . Nutritional status . Physical activity . Advanced directives . List of other physicians . Hospitalizations, surgeries, and ER visits in previous 12 months . Vitals . Screenings to include cognitive, depression, and falls . Referrals and appointments  In addition, I have reviewed and discussed with patient certain preventive protocols, quality metrics, and best practice recommendations. A written personalized care plan for preventive services as well as general preventive health recommendations were provided to patient.     Varney Biles, LPN  4/00/1809

## 2018-08-09 NOTE — Patient Instructions (Addendum)
Nice to see you. Please continue the Cymbalta for now.  We will check lab work to evaluate for a potential cause for your weakness.  Please try to increase your sugar intake just slightly to see if that makes a difference. We will get you to see an audiologist.

## 2018-08-09 NOTE — Patient Instructions (Addendum)
  Dana Obrien , Thank you for taking time to come for your Medicare Wellness Visit. I appreciate your ongoing commitment to your health goals. Please review the following plan we discussed and let me know if I can assist you in the future.   These are the goals we discussed: Goals      Patient Stated   . Increase physical activity (pt-stated)     Walk for exercise as tolerated.       This is a list of the screening recommended for you and due dates:  Health Maintenance  Topic Date Due  .  Hepatitis C: One time screening is recommended by Center for Disease Control  (CDC) for  adults born from [birth year] through 1963.   17-Oct-1945  . Tetanus Vaccine  06/02/2015  . Mammogram  08/26/2019  . Colon Cancer Screening  06/28/2023  . Flu Shot  Completed  . DEXA scan (bone density measurement)  Completed

## 2018-08-09 NOTE — Progress Notes (Signed)
[name], MD Phone: [phone]  Dana Obrien is a 73 y.o. female who presents today for follow-up.  Anxiety/depression: Patient notes she started on the Cymbalta on 07/15/2018.  She notes over the last week she started to feel better.  Less anxiety and depression.  She notes she is sleeping better as well.  No SI.  Chronic back pain: She does note the Cymbalta helped quite a bit with her chronic back pain and she has not had any significant back pain since starting on this.  Weakness: Patient notes for the a number of weeks now she is overall had a generalized sensation of weakness.  This typically occurs when she has been doing stuff during the day.  She will sit down and drink something and feel better.  She notes no chest pain or shortness of breath with this.  No focal weakness.  No numbness.  She did significantly change how she is eating just prior to this occurring and she is eating much healthier and smaller ortion sizes.  Hearing difficulty: Patient notes she has seen audiology previously and does have hearing aids that they do not help anymore.  People are having to repeat things.  She notes no tinnitus.  Social History   Tobacco Use  Smoking Status Never Smoker  Smokeless Tobacco Never Used     ROS see history of present illness  Objective  Physical Exam Vitals:   08/09/18 0921  BP: 120/82  Pulse: 77  Temp: 98 F (36.7 C)  SpO2: 97%    BP Readings from Last 3 Encounters:  08/09/18 120/82  08/09/18 120/82  06/13/18 (!) 151/83   Wt Readings from Last 3 Encounters:  08/09/18 196 lb (88.9 kg)  08/09/18 196 lb (88.9 kg)  06/13/18 198 lb 3.1 oz (89.9 kg)    Physical Exam Constitutional:      General: She is not in acute distress.    Appearance: She is not diaphoretic.  HENT:     Right Ear: Tympanic membrane normal.     Left Ear: Tympanic membrane normal.  Cardiovascular:     Rate and Rhythm: Normal rate and regular rhythm.     Heart sounds:  Normal heart sounds.  Pulmonary:     Effort: Pulmonary effort is normal.     Breath sounds: Normal breath sounds.  Skin:    General: Skin is warm and dry.  Neurological:     Mental Status: She is alert.     Comments: Hearing decreased to finger rub bilaterally, otherwise CN 3-12 intact, 5/5 strength in bilateral biceps, triceps, grip, quads, hamstrings, plantar and dorsiflexion, sensation to light touch intact in bilateral UE and LE, normal gait, 2+ patellar reflexes       Assessment/Plan: Please see individual problem list.  Bilateral hearing loss Refer to audiology.  Anxiety and depression Much improved.  She will continue Cymbalta.  Fatigue This potentially may be related to inadequate caloric intake given that it started after she decreased her calorie intake.  We will check a TSH and BMP.  She will try to take in adequate calories.  She will monitor symptoms and if not improving be reevaluated.  There is the potential that the Cymbalta could be contributing to this.  If it persists and there is no obvious cause we may need to change the Cymbalta.  Arthralgia Patient with chronic back pain since starting on Cymbalta.   Orders Placed This Encounter  Procedures  . TSH  . T4, free  .  Basic Metabolic Panel (BMET)  . Hepatitis C Antibody  . Ambulatory referral to Audiology    Referral Priority:   Routine    Referral Type:   Audiology Exam    Referral Reason:   Specialty Services Required    Number of Visits Requested:   1    Meds ordered this encounter  Medications  . Tdap (BOOSTRIX) 5-2.5-18.5 LF-MCG/0.5 injection    Sig: Inject 0.5 mLs into the muscle once for 1 dose.    Dispense:  0.5 mL    Refill:  0     [name], MD [place]

## 2018-08-10 LAB — HEPATITIS C ANTIBODY
Hepatitis C Ab: NONREACTIVE
SIGNAL TO CUT-OFF: 0.02 (ref <1.00)

## 2018-08-15 ENCOUNTER — Other Ambulatory Visit: Payer: Self-pay | Admitting: Family Medicine

## 2018-08-15 DIAGNOSIS — H9193 Unspecified hearing loss, bilateral: Secondary | ICD-10-CM | POA: Insufficient documentation

## 2018-08-15 DIAGNOSIS — R5383 Other fatigue: Secondary | ICD-10-CM | POA: Insufficient documentation

## 2018-08-15 NOTE — Progress Notes (Signed)
I have reviewed the above note and agree.  Eric Sonnenberg, M.D.  

## 2018-08-15 NOTE — Assessment & Plan Note (Signed)
Patient with chronic back pain since starting on Cymbalta.

## 2018-08-15 NOTE — Assessment & Plan Note (Signed)
Much improved.  She will continue Cymbalta.

## 2018-08-15 NOTE — Assessment & Plan Note (Signed)
Refer to audiology.

## 2018-08-15 NOTE — Assessment & Plan Note (Signed)
This potentially may be related to inadequate caloric intake given that it started after she decreased her calorie intake.  We will check a TSH and BMP.  She will try to take in adequate calories.  She will monitor symptoms and if not improving be reevaluated.  There is the potential that the Cymbalta could be contributing to this.  If it persists and there is no obvious cause we may need to change the Cymbalta.

## 2018-08-16 ENCOUNTER — Encounter: Payer: Self-pay | Admitting: Family Medicine

## 2018-08-16 ENCOUNTER — Ambulatory Visit (INDEPENDENT_AMBULATORY_CARE_PROVIDER_SITE_OTHER): Payer: PPO | Admitting: Family Medicine

## 2018-08-16 ENCOUNTER — Other Ambulatory Visit: Payer: Self-pay

## 2018-08-16 VITALS — BP 110/76 | HR 89 | Temp 97.5°F | Resp 18 | Ht 64.0 in | Wt 196.0 lb

## 2018-08-16 DIAGNOSIS — M545 Low back pain: Secondary | ICD-10-CM | POA: Diagnosis not present

## 2018-08-16 DIAGNOSIS — L299 Pruritus, unspecified: Secondary | ICD-10-CM

## 2018-08-16 DIAGNOSIS — J029 Acute pharyngitis, unspecified: Secondary | ICD-10-CM

## 2018-08-16 DIAGNOSIS — R110 Nausea: Secondary | ICD-10-CM | POA: Diagnosis not present

## 2018-08-16 DIAGNOSIS — G8929 Other chronic pain: Secondary | ICD-10-CM

## 2018-08-16 DIAGNOSIS — J019 Acute sinusitis, unspecified: Secondary | ICD-10-CM

## 2018-08-16 LAB — POC INFLUENZA A&B (BINAX/QUICKVUE)
INFLUENZA A, POC: NEGATIVE
Influenza B, POC: NEGATIVE

## 2018-08-16 LAB — POCT RAPID STREP A (OFFICE): Rapid Strep A Screen: NEGATIVE

## 2018-08-16 MED ORDER — HYDROXYZINE HCL 10 MG PO TABS: 10.0000 mg | ORAL_TABLET | Freq: Three times a day (TID) | ORAL | 1 refills | Status: DC | PRN

## 2018-08-16 MED ORDER — TRAMADOL HCL 50 MG PO TABS: 50.0000 mg | ORAL_TABLET | Freq: Four times a day (QID) | ORAL | 0 refills | Status: DC | PRN

## 2018-08-16 MED ORDER — FLUTICASONE PROPIONATE 50 MCG/ACT NA SUSP: 1.0000 | Freq: Every day | NASAL | 5 refills | Status: DC

## 2018-08-16 MED ORDER — ONDANSETRON 4 MG PO TBDP: 4.0000 mg | ORAL_TABLET | Freq: Three times a day (TID) | ORAL | 0 refills | Status: DC | PRN

## 2018-08-16 MED ORDER — AMOXICILLIN-POT CLAVULANATE 875-125 MG PO TABS: 1.0000 | ORAL_TABLET | Freq: Two times a day (BID) | ORAL | 0 refills | Status: DC

## 2018-08-16 NOTE — Progress Notes (Signed)
Subjective:    Patient ID: Dana Obrien, female    DOB: 07/03/1945, 73 y.o.   MRN: [id number]  HPI   Patient presents to clinic complaining of sinus congestion, sinus headache, thick drainage down back of throat, nausea from swallowing phlegm for 3 days.  Denies any documented fevers, but reports she has felt feverish at times and rundown.  Also reports a sore throat, but attributes this to sinus drainage.  Patient has a history of chronic sinus infections which did require a sinus surgery.  She has been using Zyrtec every day to help reduce congestion and just started doing saline nasal rinses yesterday.  Patient has not traveled outside of the [place], [place] area, has not been in contact with anyone who is traveled and has not been in contact with anyone who is under suspicion or test positive for coronavirus.  Patient also would like refill of tramadol that she takes as needed for chronic low back pain.  And also requests refill of hydroxyzine that she uses as needed usually before bed if she is having itching.  Patient Active Problem List   Diagnosis Date Noted  . Bilateral hearing loss 08/15/2018  . Fatigue 08/15/2018  . Chest tightness 06/02/2018  . Prediabetes 06/02/2018  . Arthralgia 06/02/2018  . Congestion of both ears 06/02/2018  . Malignant neoplasm of upper-outer quadrant of left breast in female, estrogen receptor positive ([place]) 09/14/2017  . Radiculopathy 08/24/2017  . Fatty liver 08/24/2017  . Abdominal pain 08/24/2017  . Hearing difficulty of both ears 06/25/2017  . Chronic sinusitis 11/05/2016  . GERD (gastroesophageal reflux disease) 09/07/2016  . Cough 07/08/2016  . Exertional shortness of breath 01/15/2016  . OSA (obstructive sleep apnea) 11/01/2015  . Low back pain 01/01/2015  . Varicose vein of leg 11/22/2014  . Post herpetic neuralgia 03/06/2014  . Cystocele 10/25/2013  . Skin lesion 10/25/2013  . Obesity, unspecified 10/19/2012  .  History of breast cancer 02/11/2012  . Hyperlipidemia 05/11/2011  . Osteopenia 05/11/2011  . Anxiety and depression 03/04/2011  . Hypertension 03/04/2011   Social History   Tobacco Use  . Smoking status: Never Smoker  . Smokeless tobacco: Never Used  Substance Use Topics  . Alcohol use: Not Currently    Comment: rarely   Review of Systems  Constitutional: +fatigue, felt feverish HENT: +congestion, ear pain, sinus pain and sore throat.   Eyes: Negative.   Respiratory: Negative for cough, shortness of breath and wheezing.   Cardiovascular: Negative for chest pain, palpitations and leg swelling.  Gastrointestinal: +nausea. Negative for abdominal pain, diarrhea, and vomiting.  Genitourinary: Negative for dysuria, frequency and urgency.  Musculoskeletal: Negative for arthralgias and myalgias.  Skin: Negative for color change, pallor and rash.  Neurological: Negative for syncope, light-headedness and headaches.  Psychiatric/Behavioral: The patient is not nervous/anxious.       Objective:   Physical Exam Vitals signs and nursing note reviewed.  Constitutional:      General: She is not in acute distress.    Appearance: She is not toxic-appearing or diaphoretic.  HENT:     Head: Normocephalic and atraumatic.     Right Ear: There is no impacted cerumen.     Left Ear: There is no impacted cerumen.     Ears:     Comments: +fullness bilateral TMs    Nose: Nasal tenderness, congestion and rhinorrhea present.     Right Sinus: Maxillary sinus tenderness and frontal sinus tenderness present.     Left  Sinus: Maxillary sinus tenderness and frontal sinus tenderness present.     Mouth/Throat:     Mouth: Mucous membranes are moist.     Pharynx: No oropharyngeal exudate or posterior oropharyngeal erythema.  Eyes:     General: No scleral icterus.    Extraocular Movements: Extraocular movements intact.     Conjunctiva/sclera: Conjunctivae normal.     Pupils: Pupils are equal, round, and  reactive to light.  Neck:     Musculoskeletal: Neck supple. No neck rigidity.  Cardiovascular:     Rate and Rhythm: Normal rate and regular rhythm.  Pulmonary:     Effort: Pulmonary effort is normal. No respiratory distress.     Breath sounds: No wheezing, rhonchi or rales.  Lymphadenopathy:     Cervical: No cervical adenopathy.  Skin:    General: Skin is warm and dry.     Coloration: Skin is not pale.  Neurological:     Mental Status: She is alert and oriented to person, place, and time.  Psychiatric:        Mood and Affect: Mood normal.        Behavior: Behavior normal.     Vitals:   08/16/18 1423  BP: 110/76  Pulse: 89  Resp: 18  Temp: (!) 97.5 F (36.4 C)  SpO2: 92%      Assessment & Plan:   Sinusitis, nausea, sore throat- patient symptoms are somewhat suspicious for sinus infection, but they due to her only having them for 3 days, I have encouraged patient to use Flonase and Zyrtec in addition to saline nasal rinses to see if this will help reduce congestion.  I have advised her to hold off on starting her antibiotics until early as Friday if she has worsening of symptoms throughout the rest of the week.  Patient advised inflammation of the sinuses can often cause congestion and pain, but does not always require antibiotics to treat.  Patient advised only reason I am giving her an antibiotic to use if needed at the end of the week is due to her past history of sinus infections that required sinus surgery.  Patient strongly encouraged to remain home as much as possible, get plenty of rest, increase fluid intake, do good handwashing.  Point-of-care flu testing is negative in clinic.  Point-of-care rapid strep testing is negative in clinic.  Nausea-suspect this is related to drainage down back of throat, Zofran scented to use as needed.  Itching-hydroxyzine refill given.  Patient aware this medication cause drowsiness.  Chronic low back pain-tramadol refill given.  [facility] PMP registry checked and is appropriate for this refill.  Patient will keep regularly scheduled follow-up PCP as planned.  Advised to return to clinic sooner if any issues arise.

## 2018-08-16 NOTE — Patient Instructions (Signed)
Use flonase and zyrtec to reduce congestion in addition to saline nasal rinses  Hold off on starting antibiotics until Friday to see if allergy med, nasal rinses help

## 2018-08-17 NOTE — Telephone Encounter (Signed)
Refilled by Lauren yesterday.

## 2018-08-29 ENCOUNTER — Other Ambulatory Visit: Payer: PPO

## 2018-08-29 ENCOUNTER — Inpatient Hospital Stay: Admission: RE | Admit: 2018-08-29 | Payer: PPO | Source: Ambulatory Visit

## 2018-09-06 ENCOUNTER — Ambulatory Visit: Payer: PPO | Admitting: General Surgery

## 2018-09-12 NOTE — Telephone Encounter (Signed)
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 As before, these are normal results. She was given access to this new report through the lab's portal.    Steele Berg, MS, [facility] Certified Genetic Counselor phone: [phone] [name]

## 2018-09-15 ENCOUNTER — Other Ambulatory Visit: Payer: Self-pay | Admitting: Family Medicine

## 2018-09-16 ENCOUNTER — Other Ambulatory Visit: Payer: Self-pay

## 2018-09-19 ENCOUNTER — Ambulatory Visit: Payer: PPO | Admitting: Oncology

## 2018-09-21 IMAGING — US US BREAST*L* LIMITED INC AXILLA
1 series · 12 of 12 positions shown · non-contrast
Comparison: Previous exam(s).

CLINICAL DATA: Left breast upper outer quadrant nodule seen most
recent screening mammography.

EXAM:
DIGITAL DIAGNOSTIC LEFT MAMMOGRAM WITH CAD AND TOMO
ULTRASOUND LEFT BREAST

[Series 1: us breast*left* limited inc axilla · 0.06mm/px · 12 of 12 slices shown]
[im 1/12]
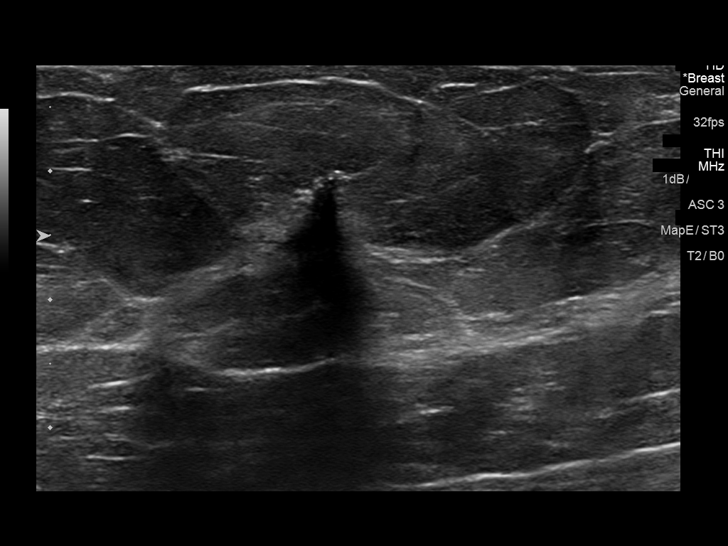
[im 2/12]
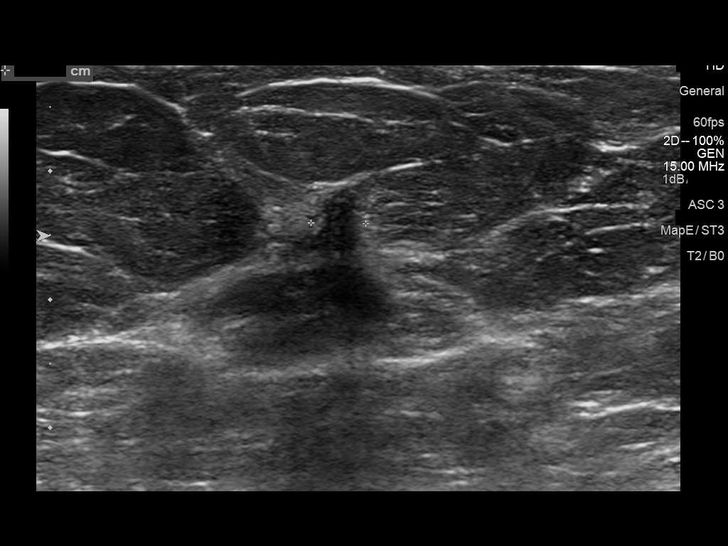
[im 3/12]
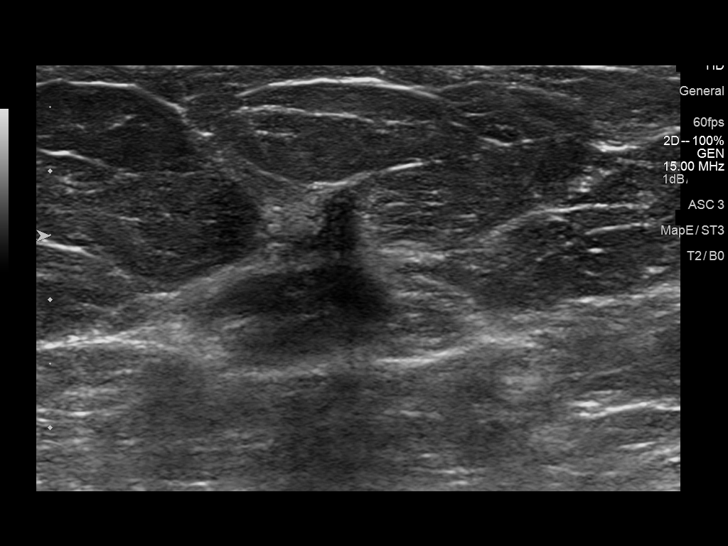
[im 4/12]
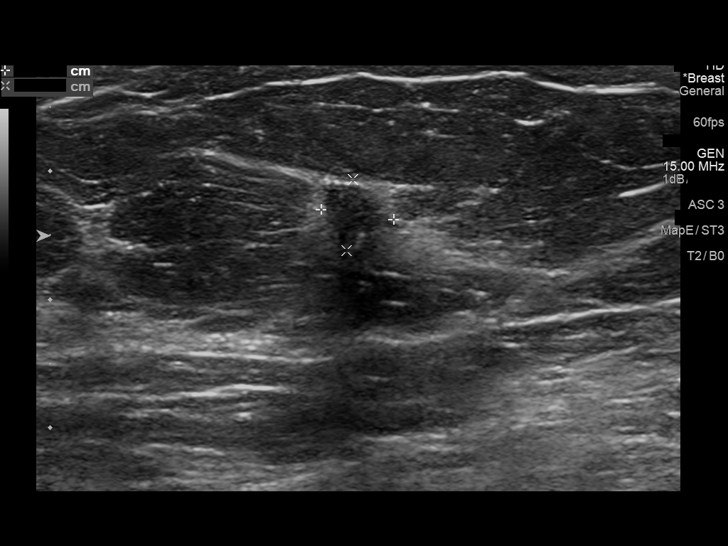
[im 5/12]
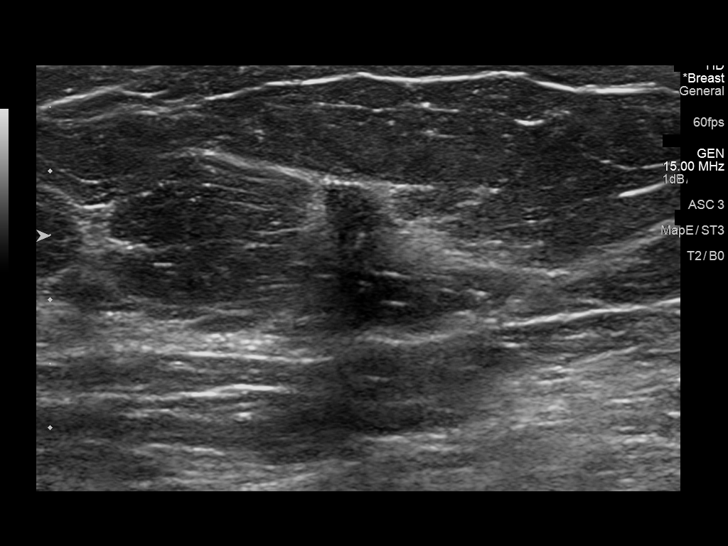
[im 6/12]
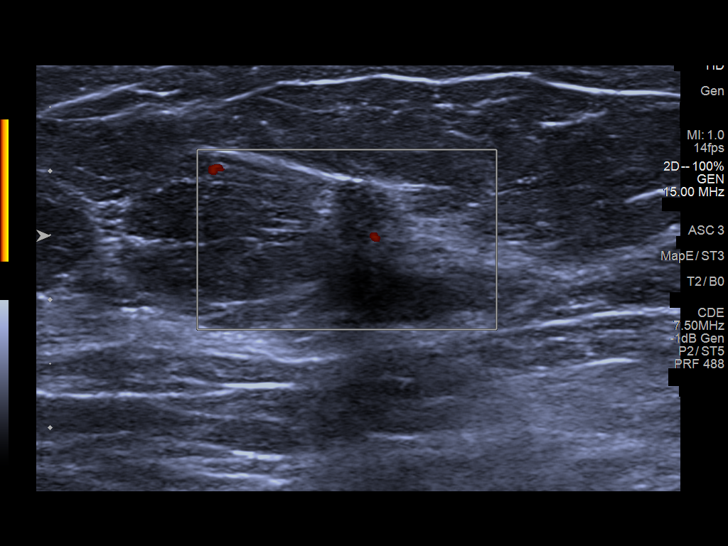
[im 7/12]
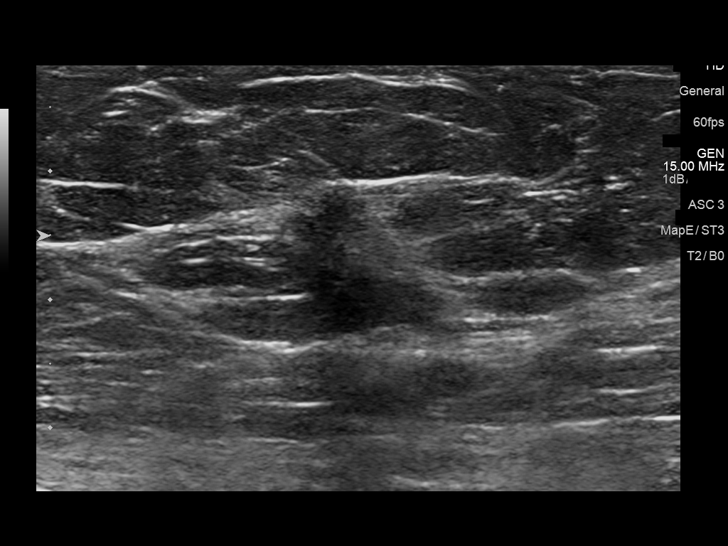
[im 8/12]
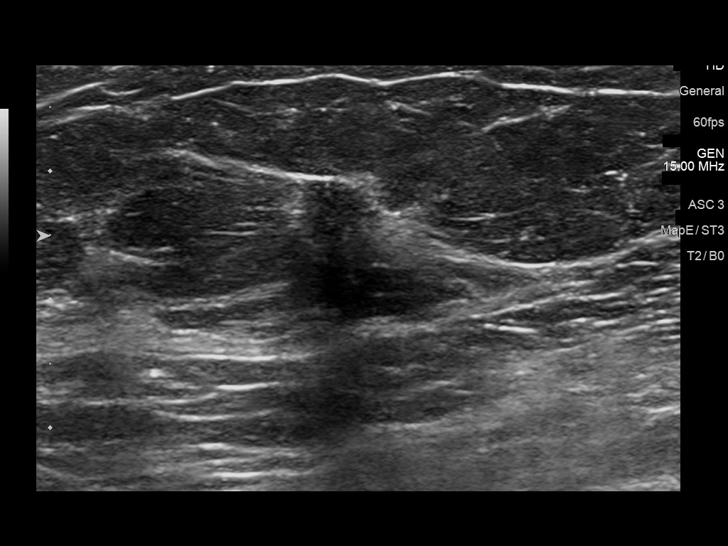
[im 9/12]
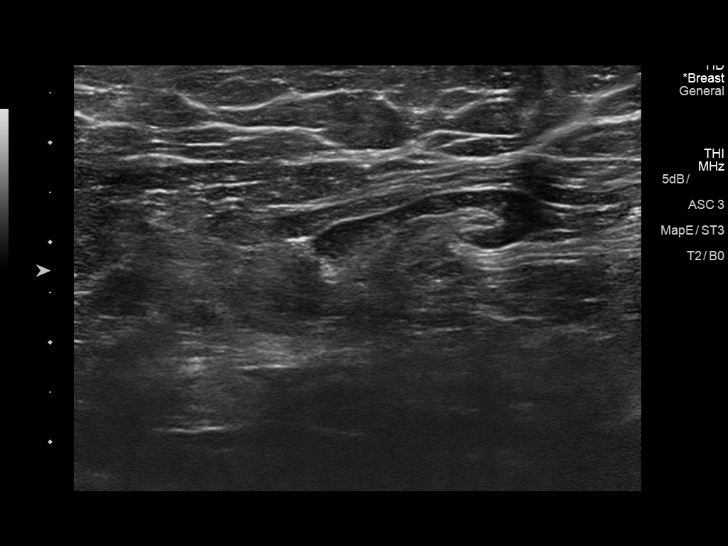
[im 10/12]
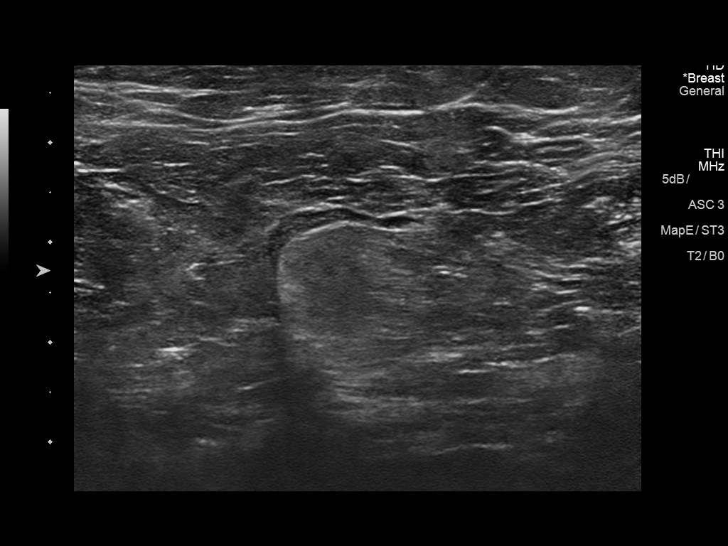
[im 11/12]
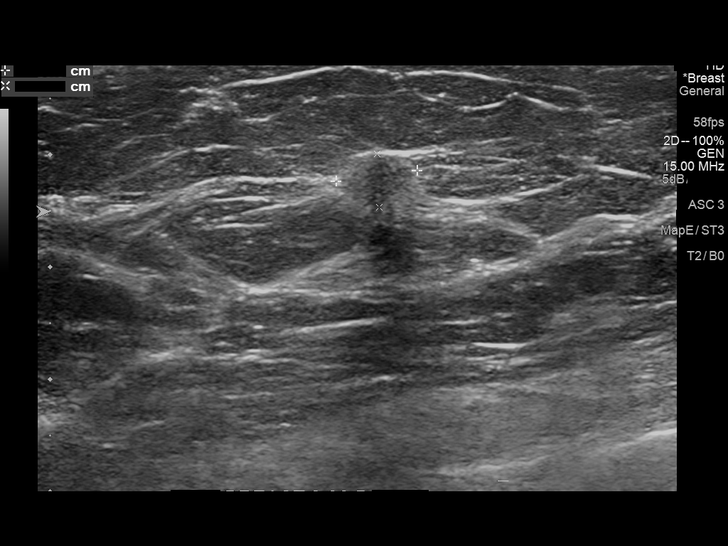
[im 12/12]
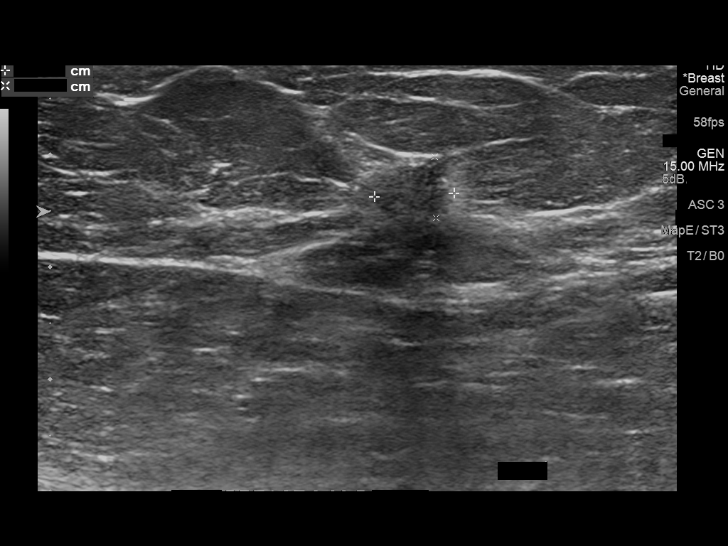

[12 of 12 positions shown; findings below may reference images not displayed]

ACR Breast Density Category b: There are scattered areas of
fibroglandular density.
FINDINGS: Additional mammographic views of the left breast demonstrate a
spiculated 1 cm mass in the left breast upper outer quadrant,
posterior depth.

Mammographic images were processed with CAD.

On physical exam, no suspicious masses are palpated.

Targeted ultrasound is performed, showing left breast 2 o'clock 5 cm
from the nipple hypoechoic irregular mass measuring 0.7 x 0.5 x
cm. There is no evidence of left axillary lymphadenopathy.
IMPRESSION: Left breast 2 o'clock suspicious mass, for which ultrasound-guided
core needle biopsy is recommended.

No evidence of left axillary.

RECOMMENDATION:
Ultrasound-guided core needle biopsy of the left breast.

I have discussed the findings and recommendations with the patient.
Results were also provided in writing at the conclusion of the
visit. If applicable, a reminder letter will be sent to the patient
regarding the next appointment.

BI-RADS CATEGORY  4: Suspicious.

## 2018-10-01 ENCOUNTER — Other Ambulatory Visit: Payer: Self-pay | Admitting: Family Medicine

## 2018-10-01 DIAGNOSIS — G8929 Other chronic pain: Secondary | ICD-10-CM

## 2018-10-01 DIAGNOSIS — M545 Low back pain, unspecified: Secondary | ICD-10-CM

## 2018-10-03 NOTE — Telephone Encounter (Signed)
Refilled: 08/16/2018 Last OV: 08/16/2018 Next OV: 12/05/2018

## 2018-10-20 ENCOUNTER — Telehealth: Payer: Self-pay

## 2018-10-20 NOTE — Telephone Encounter (Signed)
Need clarification Cymbalta 30 mg cap

## 2018-10-21 NOTE — Telephone Encounter (Signed)
Call no clarification was need per pharmacist pt was able to get there medication.

## 2018-10-21 NOTE — Telephone Encounter (Signed)
Sig: Take 1 capsule (30 mg) by mouth daily for 7 days, then increase to 2 capsules (60 mg) by mouth daily

## 2018-11-16 ENCOUNTER — Other Ambulatory Visit: Payer: Self-pay | Admitting: Family Medicine

## 2018-12-05 ENCOUNTER — Ambulatory Visit: Payer: PPO | Admitting: Family Medicine

## 2018-12-08 ENCOUNTER — Other Ambulatory Visit: Payer: Self-pay

## 2018-12-08 ENCOUNTER — Ambulatory Visit
Admission: RE | Admit: 2018-12-08 | Discharge: 2018-12-08 | Disposition: A | Discharge status: Home / Self Care | Payer: PPO | Source: Ambulatory Visit | Attending: General Surgery | Admitting: General Surgery

## 2018-12-08 DIAGNOSIS — C50412 Malignant neoplasm of upper-outer quadrant of left female breast: Secondary | ICD-10-CM

## 2018-12-08 DIAGNOSIS — Z170 Estrogen receptor positive status [ER+]: Secondary | ICD-10-CM | POA: Diagnosis not present

## 2018-12-08 DIAGNOSIS — R928 Other abnormal and inconclusive findings on diagnostic imaging of breast: Secondary | ICD-10-CM | POA: Diagnosis not present

## 2018-12-13 ENCOUNTER — Other Ambulatory Visit: Payer: Self-pay

## 2018-12-13 ENCOUNTER — Inpatient Hospital Stay: Payer: PPO | Attending: Oncology | Admitting: Oncology

## 2018-12-13 ENCOUNTER — Encounter: Payer: Self-pay | Admitting: Oncology

## 2018-12-13 VITALS — BP 138/82 | HR 80 | Temp 98.3°F | Resp 18 | Ht 64.0 in | Wt 200.6 lb

## 2018-12-13 DIAGNOSIS — J45909 Unspecified asthma, uncomplicated: Secondary | ICD-10-CM | POA: Diagnosis not present

## 2018-12-13 DIAGNOSIS — Z853 Personal history of malignant neoplasm of breast: Secondary | ICD-10-CM | POA: Diagnosis not present

## 2018-12-13 DIAGNOSIS — I10 Essential (primary) hypertension: Secondary | ICD-10-CM | POA: Diagnosis not present

## 2018-12-13 DIAGNOSIS — Z923 Personal history of irradiation: Secondary | ICD-10-CM | POA: Diagnosis not present

## 2018-12-13 DIAGNOSIS — Z791 Long term (current) use of non-steroidal anti-inflammatories (NSAID): Secondary | ICD-10-CM

## 2018-12-13 DIAGNOSIS — Z9221 Personal history of antineoplastic chemotherapy: Secondary | ICD-10-CM | POA: Diagnosis not present

## 2018-12-13 DIAGNOSIS — G8929 Other chronic pain: Secondary | ICD-10-CM | POA: Diagnosis not present

## 2018-12-13 DIAGNOSIS — Z79899 Other long term (current) drug therapy: Secondary | ICD-10-CM | POA: Insufficient documentation

## 2018-12-13 DIAGNOSIS — K219 Gastro-esophageal reflux disease without esophagitis: Secondary | ICD-10-CM

## 2018-12-13 DIAGNOSIS — Z803 Family history of malignant neoplasm of breast: Secondary | ICD-10-CM

## 2018-12-13 DIAGNOSIS — Z08 Encounter for follow-up examination after completed treatment for malignant neoplasm: Secondary | ICD-10-CM

## 2018-12-13 MED ORDER — LETROZOLE 2.5 MG PO TABS: 2.5000 mg | ORAL_TABLET | Freq: Every day | ORAL | 3 refills | Status: DC

## 2018-12-13 NOTE — Progress Notes (Signed)
Pt stopped aromasin about 8 weeks ago due to pain in all joints. She woke up one day and was so stiff it took a bit to get out of bed and hurt all day.

## 2018-12-13 NOTE — Progress Notes (Signed)
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   Chief complaint/ Reason for visit-routine follow-up of breast cancer  Heme/Onc history: patient is a 73 year old female with a prior history of right breast cancer in 2004s/p [facility]. T2N0M0.She thenn completed 5 years of aromasin until 2009.She could not tolerate arimidex.  Recent screening mammogram on 08/25/2017 showed a possible mass in her left breast. This was followed by an ultrasound and a diagnostic left mammogram which showed a 0.7 x 0.5 x 0.7 cm mass in the left breast. No evidence of left axillary adenopathy.  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 ER greater than 90% positive, PR greater than 90% positive and HER-2/neu negative   Patient has been seen by Dr. Camelia Phenes is scheduled to undergo surgery on 09/17/2017  She is G2P2L2. Menarche at the age of 53. Menopause in her early 89's. She used to take prempro from [age] yrs but none since then. She does have chronic back pain and tingling/pain in her extremities. Cervical spine MRI showed egenrative disease  Final pathology showed:Invasive mammary carcinoma 13 mm, grade 1 with associated DCIS. Negative margins. 0 out of 3 lymph nodes were positive for malignancy. 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  Aromasin started in August 2019  Interval history-patient states she started noticing problems with Aromasin about 8 weeks ago when she had severe joint pain and to the point that at times she could not get out of bed.  She therefore stopped taking her Aromasin.  Around the same time patient was also started on Cymbalta for her back pain as well as symptoms of depression.  Overall reports that her joint pain symptoms have improved significantly since she stopped taking her Aromasin  ECOG PS- 1 Pain scale- 0   Review of systems- Review of Systems  Constitutional: Negative for chills, fever, malaise/fatigue and weight loss.  HENT: Negative for congestion, ear discharge and nosebleeds.   Eyes: Negative for blurred vision.  Respiratory: Negative for cough, hemoptysis, sputum production, shortness of breath and wheezing.   Cardiovascular: Negative for chest pain, palpitations, orthopnea and claudication.  Gastrointestinal: Negative for abdominal pain, blood in stool, constipation, diarrhea, heartburn, melena, nausea and vomiting.  Genitourinary: Negative for dysuria, flank pain, frequency, hematuria and urgency.  Musculoskeletal: Negative for back pain, joint pain and myalgias.  Skin: Negative for rash.  Neurological: Negative for dizziness, tingling, focal weakness, seizures, weakness and headaches.  Endo/Heme/Allergies: Does not bruise/bleed easily.  Psychiatric/Behavioral: Negative for depression and suicidal ideas. The patient does not have insomnia.       Allergies  Allergen Reactions   Lidocaine Palpitations   Codeine Nausea Only and Nausea And Vomiting   Morphine Nausea Only  Niacin Rash   Niacin And Related Rash    ANTIHYPERLIPEMICS     Past  Medical History:  Diagnosis Date   Anxiety    Arthritis    Asthma    Breast cancer ([place]) 2004   right breast   Breast cancer ([place]) 09/08/2017   left breast   Breast cancer of upper-outer quadrant of left female breast ([place]) 09/17/2017   T1c, N0, ER 90%, PR 90%, 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   Cancer [facility]) 2004   right lumpectomy, chemotherapy and radiation Dr. Jeb Levering   Depression    GERD (gastroesophageal reflux disease)    Hypertension    Migraine    Personal history of chemotherapy 2004   Personal history of radiation therapy 2004   Personal history of radiation therapy 2019   PONV (postoperative nausea and vomiting)    Shingles    Sleep apnea    uses CPAP, severe OSA   Vitamin D deficiency    IN THE PAST     Past Surgical History:  Procedure Laterality Date   ABDOMINAL HYSTERECTOMY     ANTERIOR AND POSTERIOR REPAIR     BREAST BIOPSY Right    stereo prior to lumpectomy   BREAST BIOPSY Left 09/07/2017   US guided biopsy/positive- invasaive mammary carcinoma   BREAST EXCISIONAL BIOPSY Right 11/29/2002   Multifocal infiltrating ductal carcinoma with evidence of LCIS.  3.5 cm maximum diameter, minimal margins 5 mm.  In situ component less than 10%.   BREAST LUMPECTOMY Right 2004   BREAST LUMPECTOMY Left 09/17/2017   Procedure: BREAST EXCISION, SENTINEL NODE BIOPSY;  Surgeon: Robert Bellow, MD;  Location: [facility];  Service: General;  Laterality: Left;   COLONOSCOPY  2015   ETHMOIDECTOMY Bilateral 11/05/2016   Procedure: ETHMOIDECTOMY;  Surgeon: Melida Quitter, MD;  Location: [place];  Service: ENT;  Laterality: Bilateral;   MAXILLARY ANTROSTOMY Bilateral 11/05/2016   Procedure: MAXILLARY ANTROSTOMY;  Surgeon: Melida Quitter, MD;  Location: [place];  Service: ENT;  Laterality: Bilateral;   SINUS ENDO W/FUSION Bilateral 11/05/2016   Procedure: FRONTAL RECESS EXPLORATION;  Surgeon: Melida Quitter, MD;  Location: [place];  Service: ENT;  Laterality:  Bilateral;  BILATERAL ENDOSCOPIC SINUS SURGERY WITH FUSION   SINUS EXPLORATION  11/05/2016   SPHENOIDECTOMY Bilateral 11/05/2016   Procedure: SPHENOIDECTOMY;  Surgeon: Melida Quitter, MD;  Location: [place];  Service: ENT;  Laterality: Bilateral;    Social History   Socioeconomic History   Marital status: Widowed    Spouse name: Not on file   Number of children: Not on file   Years of education: Not on file   Highest education level: Not on file  Occupational History   Not on file  Social Needs   Financial resource strain: Not hard at all   Food insecurity    Worry: Never true    Inability: Never true   Transportation needs    Medical: No    Non-medical: No  Tobacco Use   Smoking status: Never Smoker   Smokeless tobacco: Never Used  Substance and Sexual Activity   Alcohol use: Not Currently    Comment: rarely   Drug use: No   Sexual activity: Not Currently  Lifestyle   Physical activity    Days per week: 0 days    Minutes per session: 0 min   Stress: Not at all  Relationships   Social connections    Talks on phone: Not on file    Gets together: Not on file  Attends religious service: Not on file    Active member of club or organization: Not on file    Attends meetings of clubs or organizations: Not on file    Relationship status: Not on file   Intimate partner violence    Fear of current or ex partner: Not on file    Emotionally abused: Not on file    Physically abused: Not on file    Forced sexual activity: Not on file  Other Topics Concern   Not on file  Social History Narrative   Daily Caffeine Use:  1-2 coffee   Regular Exercise -  NO    Family History  Problem Relation Age of Onset   Alzheimer's disease Sister    Alzheimer's disease Brother    Breast cancer Neg Hx      Current Outpatient Medications:    albuterol (VENTOLIN HFA) 108 (90 Base) MCG/ACT inhaler, INHALE 2 PUFFS BY MOUTH EVERY 6 HOURS AS NEEDED, Disp: 9 g, Rfl:  0   celecoxib (CELEBREX) 100 MG capsule, Take 1 capsule (100 mg total) by mouth 2 (two) times daily., Disp: 180 capsule, Rfl: 1   DULoxetine (CYMBALTA) 30 MG capsule, Take 1 capsule (30 mg) by mouth daily for 7 days, then increase to 2 capsules (60 mg) by mouth daily, Disp: 180 capsule, Rfl: 3   EQ ALLERGY RELIEF, CETIRIZINE, 10 MG tablet, Take 1 tablet by mouth once daily, Disp: 90 tablet, Rfl: 0   fluticasone (FLONASE) 50 MCG/ACT nasal spray, Place 1 spray into both nostrils daily., Disp: 16 g, Rfl: 5   hydrochlorothiazide (HYDRODIURIL) 12.5 MG tablet, Take 1 tablet (12.5 mg total) by mouth daily., Disp: 90 tablet, Rfl: 3   hydrOXYzine (ATARAX/VISTARIL) 10 MG tablet, Take 1 tablet (10 mg total) by mouth 3 (three) times daily as needed for itching., Disp: 90 tablet, Rfl: 1   ondansetron (ZOFRAN-ODT) 4 MG disintegrating tablet, Take 1 tablet (4 mg total) by mouth every 8 (eight) hours as needed for nausea or vomiting., Disp: 20 tablet, Rfl: 0   pantoprazole (PROTONIX) 40 MG tablet, Take 1 tablet (40 mg total) by mouth daily., Disp: 90 tablet, Rfl: 2   rosuvastatin (CRESTOR) 20 MG tablet, TAKE 1 TABLET BY MOUTH ONCE DAILY, Disp: 90 tablet, Rfl: 3   senna (SENOKOT) 8.6 MG TABS tablet, Take 1 tablet by mouth at bedtime. , Disp: , Rfl:    traMADol (ULTRAM) 50 MG tablet, TAKE 1 TABLET BY MOUTH EVERY 6 HOURS AS NEEDED FOR PAIN, Disp: 30 tablet, Rfl: 0   tretinoin (RETIN-A) 0.05 % cream, Apply 1 application topically at bedtime., Disp: 45 g, Rfl: 0   letrozole (FEMARA) 2.5 MG tablet, Take 1 tablet (2.5 mg total) by mouth daily., Disp: 30 tablet, Rfl: 3  Physical exam:  Vitals:   12/13/18 1007  BP: 138/82  Pulse: 80  Resp: 18  Temp: 98.3 F (36.8 C)  TempSrc: Tympanic  Weight: 200 lb 9.6 oz (91 kg)  Height: 5' 4"  (1.626 m)   Physical Exam HENT:     Head: Normocephalic and atraumatic.  Eyes:     Pupils: Pupils are equal, round, and reactive to light.  Neck:     Musculoskeletal:  Normal range of motion.  Cardiovascular:     Rate and Rhythm: Normal rate and regular rhythm.     Heart sounds: Normal heart sounds.  Pulmonary:     Effort: Pulmonary effort is normal.     Breath sounds: Normal breath sounds.  Abdominal:  General: Bowel sounds are normal.     Palpations: Abdomen is soft.  Skin:    General: Skin is warm and dry.  Neurological:     Mental Status: She is alert and oriented to person, place, and time.      CMP Latest Ref Rng & Units 08/09/2018  Glucose 70 - 99 mg/dL 99  BUN 6 - 23 mg/dL 16  Creatinine 0.40 - 1.20 mg/dL 0.80  Sodium 135 - 145 mEq/L 141  Potassium 3.5 - 5.1 mEq/L 4.3  Chloride 96 - 112 mEq/L 102  CO2 19 - 32 mEq/L 28  Calcium 8.4 - 10.5 mg/dL 9.9  Total Protein 6.0 - 8.3 g/dL -  Total Bilirubin 0.2 - 1.2 mg/dL -  Alkaline Phos 39 - 117 U/L -  AST 0 - 37 U/L -  ALT 0 - 35 U/L -   CBC Latest Ref Rng & Units 05/30/2018  WBC 4.0 - 10.5 K/uL 7.6  Hemoglobin 12.0 - 15.0 g/dL 12.9  Hematocrit 36.0 - 46.0 % 38.6  Platelets 150.0 - 400.0 K/uL 222.0    No images are attached to the encounter.  Mm Diag Breast Tomo Bilateral  Result Date: 12/08/2018 CLINICAL DATA:  History of treated left breast cancer, status post breast conservation therapy in 2019. Prior breast conservation therapy of the right breast for right breast cancer in 2004. EXAM: DIGITAL DIAGNOSTIC BILATERAL MAMMOGRAM WITH CAD AND TOMO COMPARISON:  Previous exam(s). ACR Breast Density Category b: There are scattered areas of fibroglandular density. FINDINGS: Mammographically, there are no suspicious masses, areas of nonsurgical architectural distortion or microcalcifications in either breast. Expected posttreatment changes in the left breast. Stable posttreatment changes in the right breast. Mammographic images were processed with CAD. IMPRESSION: No mammographic evidence of malignancy in either breast, status post bilateral lumpectomies. RECOMMENDATION: Diagnostic mammogram is  suggested in 1 year. (Code:DM-B-01Y) I have discussed the findings and recommendations with the patient. Results were also provided in writing at the conclusion of the visit. If applicable, a reminder letter will be sent to the patient regarding the next appointment. BI-RADS CATEGORY  2: Benign. Electronically Signed   By: Fidela Salisbury M.D.   On: 12/08/2018 10:17     Assessment and plan- Patient is a 73 y.o. female withinvasive mammary carcinoma of the left breast stage IA pT1c pN0 cM0 ER PR positive and HER-2/neu negativestatus post lumpectomyand radiation treatment.   This is a routine follow-up visit for breast cancer  Recent mammogram in July 2020 showed no evidence of malignancy.  Clinically patient is doing well and there are no concerning symptoms worrisome for recurrence.  Since patient could not tolerate her Aromasin I will switch her to letrozole at this time.  Patient will call us if you have any significant side effects with that.  I will see her back in 6 months time no labs.   Visit Diagnosis 1. Encounter for follow-up surveillance of breast cancer      Dr. Randa Evens, MD, MPH [facility] [phone] 12/13/2018 10:55 AM

## 2018-12-14 ENCOUNTER — Ambulatory Visit: Payer: PPO | Admitting: Radiation Oncology

## 2019-02-02 ENCOUNTER — Encounter: Payer: Self-pay | Admitting: *Deleted

## 2019-02-08 ENCOUNTER — Other Ambulatory Visit: Payer: Self-pay

## 2019-02-08 DIAGNOSIS — R6889 Other general symptoms and signs: Secondary | ICD-10-CM | POA: Diagnosis not present

## 2019-02-08 DIAGNOSIS — Z20822 Contact with and (suspected) exposure to covid-19: Secondary | ICD-10-CM

## 2019-02-09 LAB — NOVEL CORONAVIRUS, NAA: SARS-CoV-2, NAA: NOT DETECTED

## 2019-02-12 IMAGING — CT CT ABD-PELV W/ CM
2 of 5 series · 17 of 46 positions shown, 19 images · IV contrast (APPLIED)
Comparison: 11/20/2013

CLINICAL DATA: Acute presentation with abdominal pain and nausea.
Constipation.

EXAM:
CT ABDOMEN AND PELVIS WITH CONTRAST
TECHNIQUE: Multidetector CT imaging of the abdomen and pelvis was performed
using the standard protocol following bolus administration of
intravenous contrast.
CONTRAST:  75mL 5VIRDA-W2U IOPAMIDOL (5VIRDA-W2U) INJECTION 76%

[Series 2: routine abd/pel with · axial · 0.90mm/px · z∈[-1684,-1259]mm · 14 of 97 slices shown, 16 images]
[im 6/97  soft-tissue]
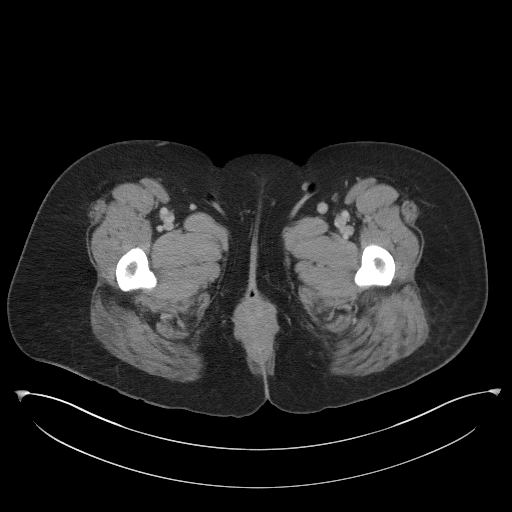
[im 6/97  bone]
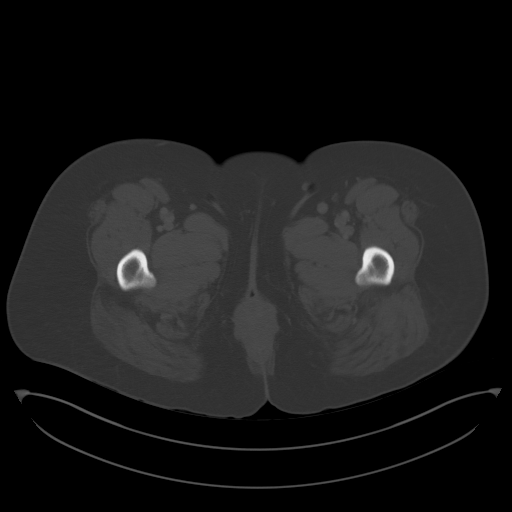
[im 11/97  soft-tissue]
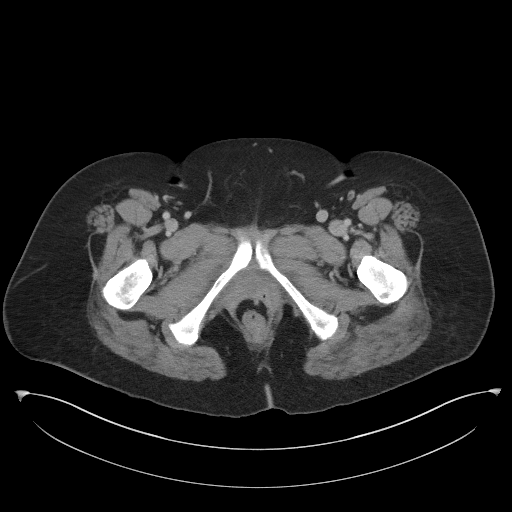
[im 22/97  soft-tissue]
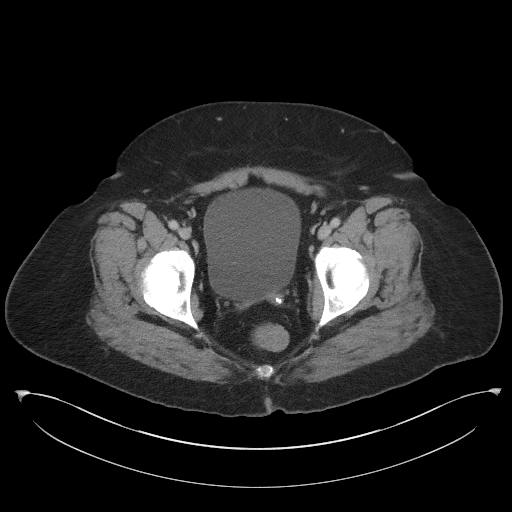
[im 27/97  soft-tissue]
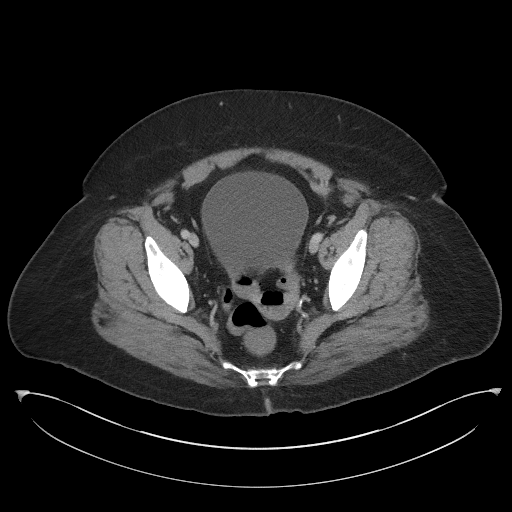
[im 33/97  soft-tissue]
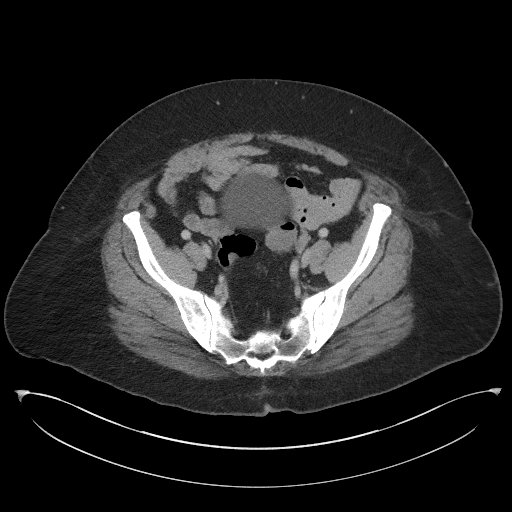
[im 38/97  soft-tissue]
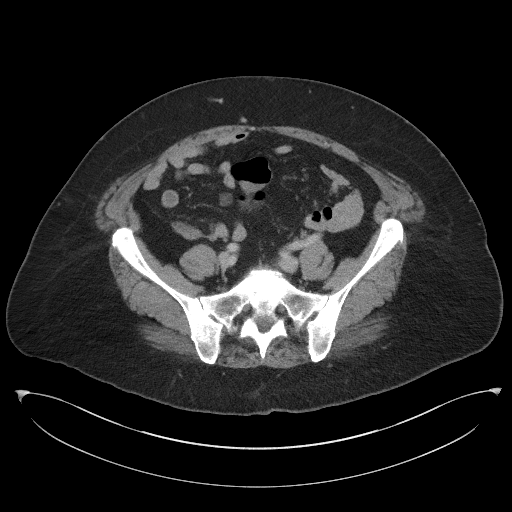
[im 43/97  soft-tissue]
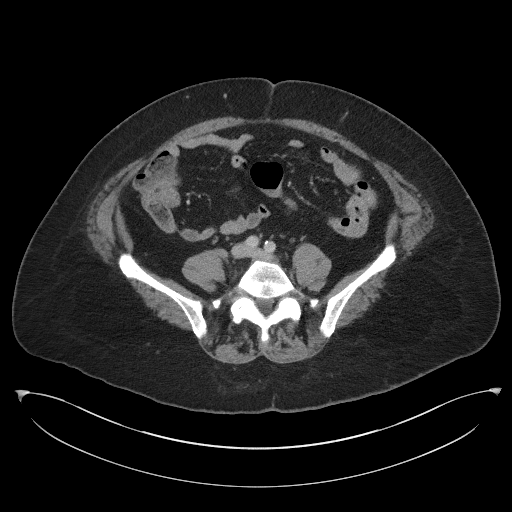
[im 54/97  soft-tissue]
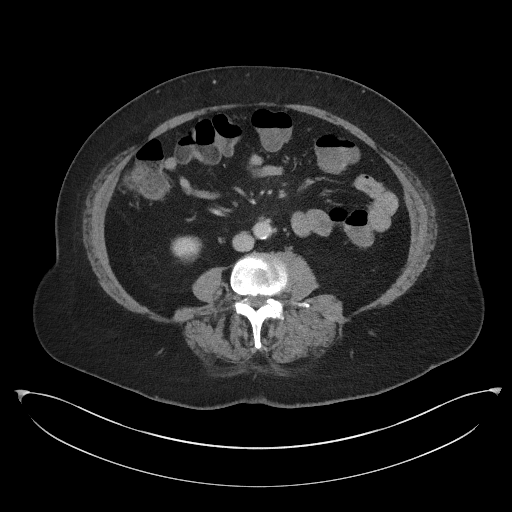
[im 59/97  soft-tissue]
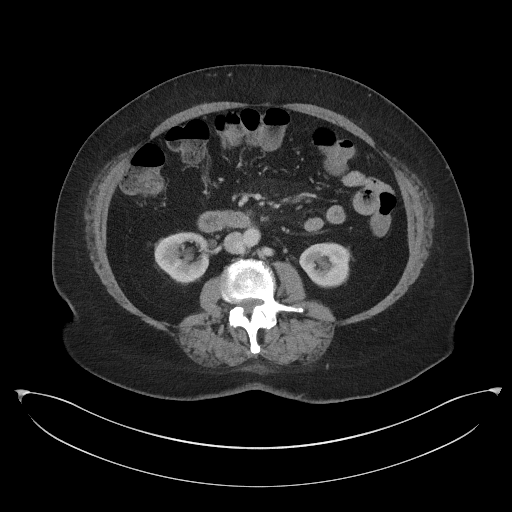
[im 59/97  bone]
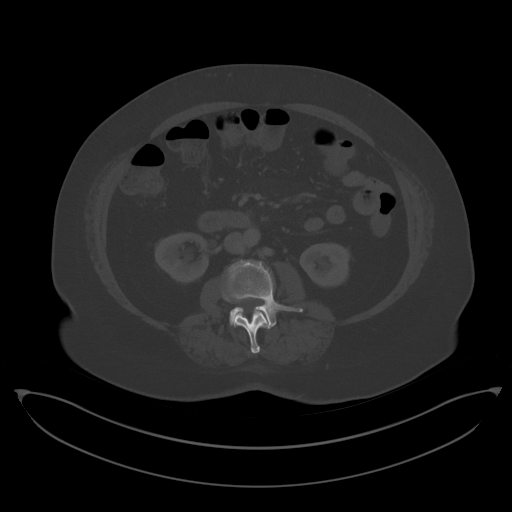
[im 65/97  soft-tissue]
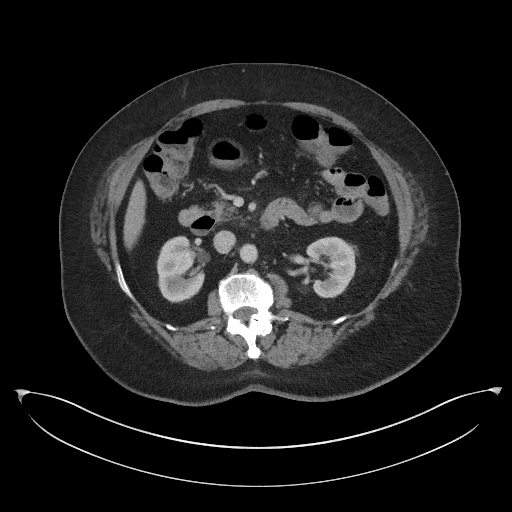
[im 70/97  soft-tissue]
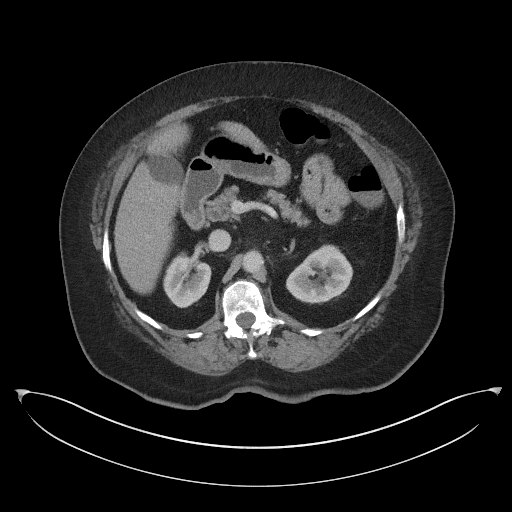
[im 75/97  soft-tissue]
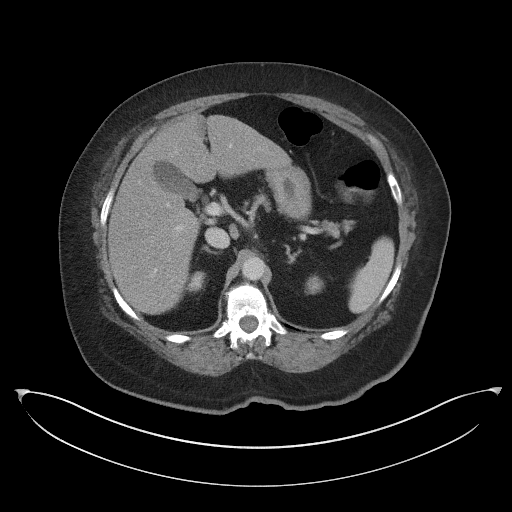
[im 86/97  soft-tissue]
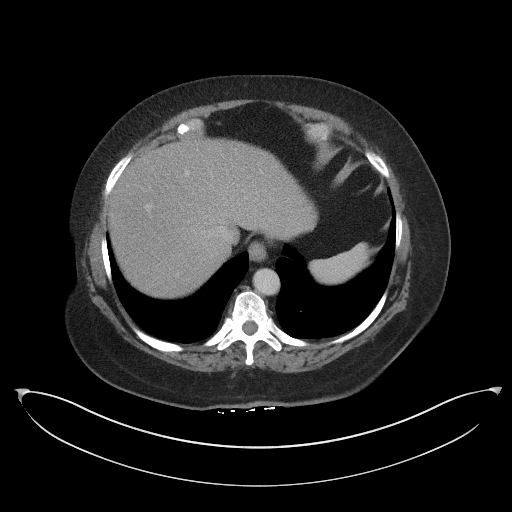
[im 91/97  soft-tissue]
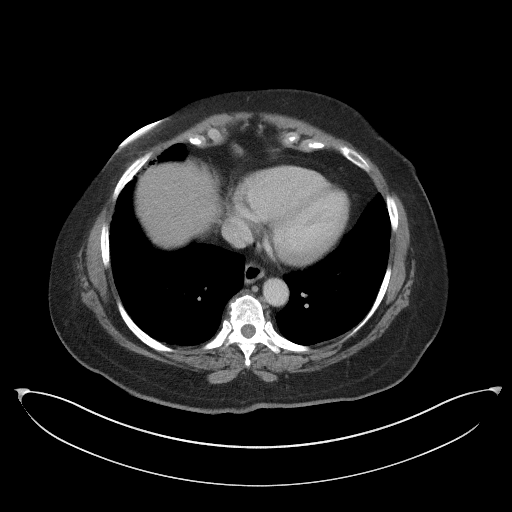

[Series 5: coronal st · coronal · 0.88mm/px · 3 of 108 slices shown]
[im 36/108  soft-tissue]
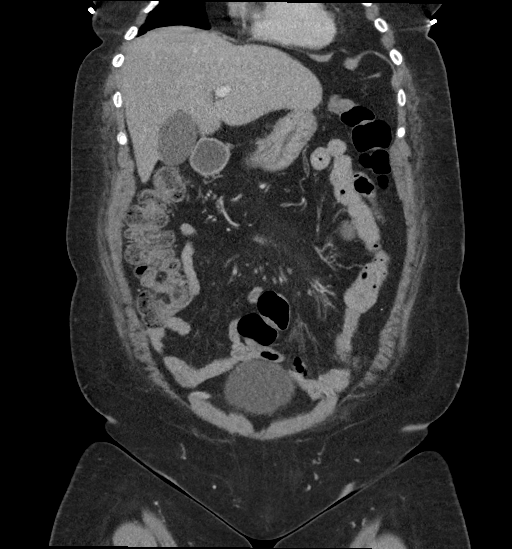
[im 48/108  soft-tissue]
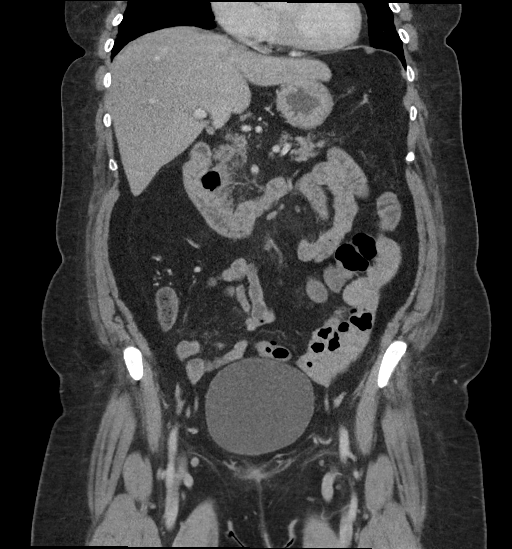
[im 60/108  soft-tissue]
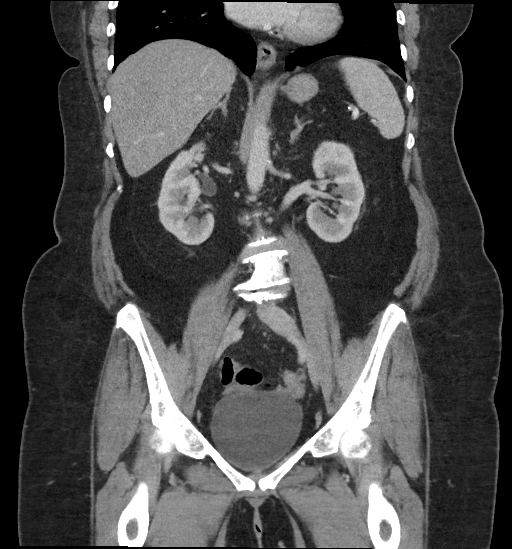

[17 of 46 positions shown; findings below may reference images not displayed]

FINDINGS: Lower chest: Normal

Hepatobiliary: Normal

Pancreas: Normal

Spleen: Normal

Adrenals/Urinary Tract: Adrenal glands are normal. Kidneys are
normal except for a 1 cm cyst in the lateral midportion of the right
kidney. No stone disease.

Stomach/Bowel: No abnormal bowel finding. No evidence of obstruction
or inflammatory process. No focal bowel lesion identified.

Vascular/Lymphatic: Aortic atherosclerosis. No aneurysm. IVC is
normal. No retroperitoneal adenopathy.

Reproductive: Previous hysterectomy.  No pelvic mass.

Other: No free fluid or air.

Musculoskeletal: Chronic lower lumbar degenerative changes and
spinal curvature.
IMPRESSION: No acute finding by CT. No evidence of bowel obstruction. No sign of
inflammatory disease or other focal finding to explain the clinical
presentation.

## 2019-03-13 ENCOUNTER — Other Ambulatory Visit: Payer: Self-pay | Admitting: Oncology

## 2019-03-13 ENCOUNTER — Other Ambulatory Visit: Payer: Self-pay | Admitting: Family Medicine

## 2019-03-13 DIAGNOSIS — G8929 Other chronic pain: Secondary | ICD-10-CM

## 2019-03-13 DIAGNOSIS — M545 Low back pain, unspecified: Secondary | ICD-10-CM

## 2019-03-15 ENCOUNTER — Other Ambulatory Visit: Payer: Self-pay

## 2019-03-23 ENCOUNTER — Encounter: Payer: Self-pay | Admitting: Family Medicine

## 2019-03-23 DIAGNOSIS — G8929 Other chronic pain: Secondary | ICD-10-CM

## 2019-03-27 MED ORDER — TRAMADOL HCL 50 MG PO TABS: 50.0000 mg | ORAL_TABLET | Freq: Four times a day (QID) | ORAL | 0 refills | Status: DC | PRN

## 2019-03-27 NOTE — Addendum Note (Signed)
Addended by: Leone Haven on: 03/27/2019 05:44 PM   Modules accepted: Orders

## 2019-04-03 ENCOUNTER — Other Ambulatory Visit: Payer: Self-pay | Admitting: Family Medicine

## 2019-04-06 ENCOUNTER — Other Ambulatory Visit: Payer: Self-pay

## 2019-04-06 ENCOUNTER — Ambulatory Visit (INDEPENDENT_AMBULATORY_CARE_PROVIDER_SITE_OTHER): Payer: PPO

## 2019-04-06 ENCOUNTER — Encounter: Payer: Self-pay | Admitting: Emergency Medicine

## 2019-04-06 ENCOUNTER — Ambulatory Visit
Admission: EM | Admit: 2019-04-06 | Discharge: 2019-04-06 | Disposition: A | Discharge status: Home / Self Care | Payer: PPO | Attending: Family Medicine | Admitting: Family Medicine

## 2019-04-06 DIAGNOSIS — M25511 Pain in right shoulder: Secondary | ICD-10-CM

## 2019-04-06 DIAGNOSIS — Z7189 Other specified counseling: Secondary | ICD-10-CM

## 2019-04-06 DIAGNOSIS — M19011 Primary osteoarthritis, right shoulder: Secondary | ICD-10-CM | POA: Diagnosis not present

## 2019-04-06 DIAGNOSIS — R05 Cough: Secondary | ICD-10-CM | POA: Diagnosis not present

## 2019-04-06 DIAGNOSIS — Z20822 Contact with and (suspected) exposure to covid-19: Secondary | ICD-10-CM

## 2019-04-06 DIAGNOSIS — J029 Acute pharyngitis, unspecified: Secondary | ICD-10-CM

## 2019-04-06 DIAGNOSIS — Z20828 Contact with and (suspected) exposure to other viral communicable diseases: Secondary | ICD-10-CM

## 2019-04-06 DIAGNOSIS — J0100 Acute maxillary sinusitis, unspecified: Secondary | ICD-10-CM | POA: Diagnosis not present

## 2019-04-06 LAB — RAPID STREP SCREEN (MED CTR MEBANE ONLY): Streptococcus, Group A Screen (Direct): NEGATIVE

## 2019-04-06 LAB — RAPID INFLUENZA A&B ANTIGENS
Influenza A (ARMC): NEGATIVE
Influenza B (ARMC): NEGATIVE

## 2019-04-06 MED ORDER — AMOXICILLIN-POT CLAVULANATE 875-125 MG PO TABS: 1.0000 | ORAL_TABLET | Freq: Two times a day (BID) | ORAL | 0 refills | Status: AC

## 2019-04-06 MED ORDER — AMOXICILLIN-POT CLAVULANATE 875-125 MG PO TABS: 1.0000 | ORAL_TABLET | Freq: Two times a day (BID) | ORAL | 0 refills | Status: DC

## 2019-04-06 MED ORDER — MELOXICAM 15 MG PO TABS: 15.0000 mg | ORAL_TABLET | Freq: Every day | ORAL | 0 refills | Status: DC

## 2019-04-06 NOTE — ED Provider Notes (Signed)
[place]   Name: Dana Obrien DOB: May 01, 1946 MRN: [id number] CSN: [id number] PCP: Leone Haven, MD  Arrival date and time:  04/06/19 0850  Chief Complaint:  Cough, Sore Throat, and Shoulder Pain   NOTE: Prior to seeing the patient today, I have reviewed the triage nursing documentation and vital signs. Clinical staff has updated patient's PMH/PSHx, current medication list, and drug allergies/intolerances to ensure comprehensive history available to assist in medical decision making.   History:   HPI: Dana Obrien is a 73 y.o. female who presents today with multiple medical complaints as follows:   Respiratory - patient presents with complaints of cough, congestion, and sore throat that has been worsening for the last 3 days. She complains of a generalized headache and paranasal sinus tenderness. Patient has a history of recurrent sinusitis. Cough has been productive of yellow/green sputum. Patient has had fevers (Tmax unknown), chills, nausea, and intermittent episodes of diaphoresis.  She denies that she has experienced any vomiting, diarrhea, or abdominal pain. She is eating and drinking well. Patient denies any perceived alterations to her sense of taste or smell. Patient denies being in close contact with anyone known to be ill. She has never been tested for SARS-CoV-2 (novel coronavirus) per her report. PMH (+) for seasonal allergies; takes daily cetirizine and fluticasone. She also has underlying asthma; uses SABA inhaler (albuterol) as needed. Of note, patient on adjuvant hormonal therapy (letrozole) for breast cancer.    Shoulder pain - patient reports a fall x 1 month ago. She reports that she slipped on a wet floor and fell into the door frame. Since that time, patient advising that she has had pain and difficulty with abduction of her arm. Pain intermittently radiates into her elbow with normal ROM movements, especially abduction. She reports that it feels  like her previous bursitis. She has contacted her PCP, however is unable to get in until the middle of December. Patient taking APAP and IBU for pain. She has Tramadol for PRN use.   Past Medical History:  Diagnosis Date  . Anxiety   . Arthritis   . Asthma   . Breast cancer ([place]) 2004   right breast  . Breast cancer ([place]) 09/08/2017   left breast  . Breast cancer of upper-outer quadrant of left female breast ([place]) 09/17/2017   T1c, N0, ER 90%, PR 90%, 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  . Cancer [facility]) 2004   right lumpectomy, chemotherapy and radiation Dr. Jeb Levering  . Depression   . GERD (gastroesophageal reflux disease)   . Hypertension   . Migraine   . Personal history of chemotherapy 2004  . Personal history of radiation therapy 2004  . Personal history of radiation therapy 2019  . PONV (postoperative nausea and vomiting)   . Shingles   . Sleep apnea    uses CPAP, severe OSA  . Vitamin D deficiency    IN THE PAST    Past Surgical History:  Procedure Laterality Date  . ABDOMINAL HYSTERECTOMY    . ANTERIOR AND POSTERIOR REPAIR    . BREAST BIOPSY Right    stereo prior to lumpectomy  . BREAST BIOPSY Left 09/07/2017   US guided biopsy/positive- invasaive mammary carcinoma  . 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  3.5 cm maximum diameter, minimal margins 5 mm.  In situ component less than 10%.  Marland Kitchen BREAST LUMPECTOMY Right 2004  . BREAST LUMPECTOMY Left 09/17/2017   Procedure: BREAST  EXCISION, SENTINEL NODE BIOPSY;  Surgeon: Robert Bellow, MD;  Location: [facility] ORS;  Service: General;  Laterality: Left;  . COLONOSCOPY  2015  . ETHMOIDECTOMY Bilateral 11/05/2016   Procedure: ETHMOIDECTOMY;  Surgeon: Melida Quitter, MD;  Location: [place];  Service: ENT;  Laterality: Bilateral;  . MAXILLARY ANTROSTOMY Bilateral 11/05/2016   Procedure: MAXILLARY ANTROSTOMY;  Surgeon: Melida Quitter, MD;  Location: [place];  Service: ENT;   Laterality: Bilateral;  . SINUS ENDO W/FUSION Bilateral 11/05/2016   Procedure: FRONTAL RECESS EXPLORATION;  Surgeon: Melida Quitter, MD;  Location: [place];  Service: ENT;  Laterality: Bilateral;  BILATERAL ENDOSCOPIC SINUS SURGERY WITH FUSION  . SINUS EXPLORATION  11/05/2016  . SPHENOIDECTOMY Bilateral 11/05/2016   Procedure: SPHENOIDECTOMY;  Surgeon: Melida Quitter, MD;  Location: [facility] OR;  Service: ENT;  Laterality: Bilateral;    Family History  Problem Relation Age of Onset  . Alzheimer's disease Sister   . Alzheimer's disease Brother   . Breast cancer Neg Hx     Social History   Tobacco Use  . Smoking status: Never Smoker  . Smokeless tobacco: Never Used  Substance Use Topics  . Alcohol use: Not Currently    Comment: rarely  . Drug use: No    Patient Active Problem List   Diagnosis Date Noted  . Bilateral hearing loss 08/15/2018  . Fatigue 08/15/2018  . Chest tightness 06/02/2018  . Prediabetes 06/02/2018  . Arthralgia 06/02/2018  . Congestion of both ears 06/02/2018  . Malignant neoplasm of upper-outer quadrant of left breast in female, estrogen receptor positive ([place]) 09/14/2017  . Radiculopathy 08/24/2017  . Fatty liver 08/24/2017  . Abdominal pain 08/24/2017  . Hearing difficulty of both ears 06/25/2017  . Chronic sinusitis 11/05/2016  . GERD (gastroesophageal reflux disease) 09/07/2016  . Cough 07/08/2016  . Exertional shortness of breath 01/15/2016  . OSA (obstructive sleep apnea) 11/01/2015  . Low back pain 01/01/2015  . Varicose vein of leg 11/22/2014  . Post herpetic neuralgia 03/06/2014  . Cystocele 10/25/2013  . Skin lesion 10/25/2013  . Obesity, unspecified 10/19/2012  . History of breast cancer 02/11/2012  . Hyperlipidemia 05/11/2011  . Osteopenia 05/11/2011  . Anxiety and depression 03/04/2011  . Hypertension 03/04/2011    Home Medications:    Current Meds  Medication Sig  . albuterol (VENTOLIN HFA) 108 (90 Base) MCG/ACT inhaler INHALE 2 PUFFS BY  MOUTH EVERY 6 HOURS AS NEEDED  . DULoxetine (CYMBALTA) 30 MG capsule Take 1 capsule (30 mg) by mouth daily for 7 days, then increase to 2 capsules (60 mg) by mouth daily  . EQ ALLERGY RELIEF, CETIRIZINE, 10 MG tablet Take 1 tablet by mouth once daily  . fluticasone (FLONASE) 50 MCG/ACT nasal spray Place 1 spray into both nostrils daily.  . hydrochlorothiazide (HYDRODIURIL) 12.5 MG tablet Take 1 tablet (12.5 mg total) by mouth daily.  . Multiple Vitamin (MULTI-VITAMIN) tablet Take by mouth.  . pantoprazole (PROTONIX) 40 MG tablet Take 1 tablet (40 mg total) by mouth daily.  . rosuvastatin (CRESTOR) 20 MG tablet TAKE 1 TABLET BY MOUTH ONCE DAILY  . senna (SENOKOT) 8.6 MG TABS tablet Take 1 tablet by mouth at bedtime.   . traMADol (ULTRAM) 50 MG tablet Take 1 tablet (50 mg total) by mouth every 6 (six) hours as needed. for pain  . tretinoin (RETIN-A) 0.05 % cream Apply 1 application topically at bedtime.    Allergies:   Lidocaine, Codeine, Morphine, Niacin, and Niacin and related  Review of Systems (ROS):  Review of Systems  Constitutional: Positive for diaphoresis (intermittent episodes). Negative for fatigue and fever.  HENT: Positive for congestion, sinus pressure, sinus pain and sore throat. Negative for ear pain, postnasal drip, rhinorrhea and sneezing.   Eyes: Negative for pain, discharge and redness.  Respiratory: Positive for cough. Negative for chest tightness and shortness of breath.   Cardiovascular: Negative for chest pain and palpitations.  Gastrointestinal: Positive for nausea. Negative for abdominal pain, diarrhea and vomiting.  Musculoskeletal: Negative for arthralgias, back pain, myalgias and neck pain.       RIGHT shoulder pain  Skin: Negative for color change, pallor and rash.  Allergic/Immunologic: Positive for environmental allergies (seasonal).  Neurological: Positive for headaches. Negative for dizziness, syncope and weakness.  Hematological: Negative for adenopathy.      Vital Signs: Today's Vitals   04/06/19 0913 04/06/19 0914 04/06/19 0919 04/06/19 1026  BP:   (!) 147/89   Pulse:   81   Resp:   18   Temp:   99.2 F (37.3 C)   TempSrc:   Oral   SpO2:   95%   Weight:  200 lb (90.7 kg)    Height:  5' 4"  (1.626 m)    PainSc: 2    2     Physical Exam: Physical Exam  Constitutional: She is oriented to person, place, and time and well-developed, well-nourished, and in no distress. No distress.  HENT:  Head: Normocephalic and atraumatic.  Right Ear: Tympanic membrane normal.  Left Ear: Tympanic membrane normal.  Nose: Mucosal edema, rhinorrhea and sinus tenderness present.  Mouth/Throat: Uvula is midline and mucous membranes are normal. Posterior oropharyngeal erythema present. No posterior oropharyngeal edema.  Eyes: Pupils are equal, round, and reactive to light. Conjunctivae and EOM are normal.  Neck: Normal range of motion. Neck supple.  Cardiovascular: Normal rate, regular rhythm, normal heart sounds and intact distal pulses. Exam reveals no gallop and no friction rub.  No murmur heard. Pulmonary/Chest: Effort normal. No respiratory distress. She has no decreased breath sounds. She has no wheezes. She has no rhonchi. She has no rales.  Abdominal: Soft. Normal appearance and bowel sounds are normal. She exhibits no distension. There is no abdominal tenderness.  Musculoskeletal:     Right shoulder: She exhibits decreased range of motion and pain. She exhibits no tenderness, no swelling, no effusion, no deformity, no spasm, normal pulse and normal strength.  Neurological: She is alert and oriented to person, place, and time. She has normal sensation, normal strength and normal reflexes. Gait normal.  Skin: Skin is warm and dry. No rash noted. She is not diaphoretic.  Psychiatric: Mood, memory, affect and judgment normal.  Nursing note and vitals reviewed.   Urgent Care Treatments / Results:   LABS: PLEASE NOTE: all labs that were ordered  this encounter are listed, however only abnormal results are displayed. Labs Reviewed  RAPID STREP SCREEN ([facility] ONLY)  RAPID INFLUENZA A&B ANTIGENS ([facility] ONLY)  NOVEL CORONAVIRUS, NAA (HOSP ORDER, SEND-OUT TO REF LAB; TAT 18-24 HRS)  CULTURE, GROUP A STREP [facility])    EKG: -None  RADIOLOGY: Dg Shoulder Right  Result Date: 04/06/2019 CLINICAL DATA:  73 year old female with pain and limited range of motion EXAM: RIGHT SHOULDER - 2+ VIEW COMPARISON:  None. FINDINGS: No acute displaced fracture. Glenohumeral joint is congruent. Degenerative changes of the [facility] joint. IMPRESSION: Negative for acute bony abnormality. Degenerative changes of the [facility] joint Electronically Signed   By: Corrie Mckusick D.O.  On: 04/06/2019 10:03    PROCEDURES: Procedures  MEDICATIONS RECEIVED THIS VISIT: Medications - No data to display  PERTINENT CLINICAL COURSE NOTES/UPDATES:   Initial Impression / Assessment and Plan / Urgent Care Course:  Pertinent labs & imaging results that were available during my care of the patient were personally reviewed by me and considered in my medical decision making (see lab/imaging section of note for values and interpretations).  Dana Obrien is a 73 y.o. female who presents to [facility] today with complaints of Cough, Sore Throat, and Shoulder Pain   Patient overall well appearing and in no acute distress today in clinic. Presenting symptoms (see HPI) and exam as documented above. Patient presents with multiple medical complaints. Will address as follows:  RESPIRATORY:  She presents with symptoms associated with SARS-CoV-2 (novel coronavirus). Discussed typical symptom constellation. Reviewed potential for infection and need for testing. Patient amenable to being tested. SARS-CoV-2 swab collected by certified clinical staff. Discussed variable turn around times associated with testing, as swabs are being processed at [facility], and have been taking between  2-5 days to come back. She was advised to self quarantine, per [facility] DHHS guidelines, until negative results received.    Rapid streptococcal throat swab (-); reflex culture sent. No fevers. Influenza testing deferred.   Symptoms have been progressive despite conservative treatment with allergy medications, mucolytics, albuterol, and nasal spray. PMH (+) recurrent sinus issues. Patient with underlying asthma and is currently on adjuvant hormonal therapy for breast cancer. Will proceed with treatment for acute maxillary sinusitis using a 7 day course of amoxicillin-clavulanate.Patient does not feel as if she needs anything for cough. She was advised to return call to the clinic should further interventions be required.     Discussed supportive care measures at home during acute phase of illness. Patient to rest as much as possible. She was encouraged to ensure adequate hydration (water and ORS) to prevent dehydration and electrolyte derangements. Patient may use APAP and/or IBU on an as needed basis for pain/fever. Encouraged warm salt water gargles and lozenges to help soothe her sore throat.   SHOULDER PAIN:  Diagnostic radiographs of the RIGHT shoulder revealed no acute abnormalities; no fracture, dislocation, or effusion. There were degenerative changes noted in the [facility] joint. Discussed that a negative plain film did not rule out a soft tissue injury. ROM is limited, especially abduction. PMH (+) for bursitis. Doubt cuff arthropathy, however this cannot be ruled out without more advanced imaging.    Will treat with meloxicam. Patient encouraged to apply complimentary strategies in efforts to relieve her shoulder pain and improve her overall mobility.  She was encouraged to apply heat/ice TID-QID for at least 15-20 minutes at a time. Demonstrated ROM and stretching exercises in clinic. Patient has supply of Tramadol at home to use for more severe pain (prescribed by PCP).    Patient advised that if  not improving in 1 week, she will need to be seen in follow up consult by orthopedics. She was provided with the name and office contact information for Dr. Hessie Knows today. She was advised that she would need to call for an appointment to be seen if not improving.   Discussed follow up with primary care physician in 1 week for re-evaluation. I have reviewed the follow up and strict return precautions for any new or worsening symptoms. Patient is aware of symptoms that would be deemed urgent/emergent, and would thus require further evaluation either here or in the emergency  department. At the time of discharge, she verbalized understanding and consent with the discharge plan as it was reviewed with her. All questions were fielded by provider and/or clinic staff prior to patient discharge.    Final Clinical Impressions / Urgent Care Diagnoses:   Final diagnoses:  Acute non-recurrent maxillary sinusitis  Encounter for laboratory testing for COVID-19 virus  Advice given about COVID-19 virus infection  Acute pain of right shoulder    New Prescriptions:  [place] Controlled Substance Registry consulted? Not Applicable  Meds ordered this encounter  Medications  . amoxicillin-clavulanate (AUGMENTIN) 875-125 MG tablet    Sig: Take 1 tablet by mouth 2 (two) times daily for 7 days.    Dispense:  14 tablet    Refill:  0  . meloxicam (MOBIC) 15 MG tablet    Sig: Take 1 tablet (15 mg total) by mouth daily.    Dispense:  30 tablet    Refill:  0    Recommended Follow up Care:  Patient encouraged to follow up with the following provider within the specified time frame, or sooner as dictated by the severity of her symptoms. As always, she was instructed that for any urgent/emergent care needs, she should seek care either here or in the emergency department for more immediate evaluation.  Follow-up Information    Leone Haven, MD In 1 week.   Specialty: Family Medicine Why: General reassessment of  symptoms if not improving Contact information: [place] [address] [phone]        Hessie Knows, MD In 1 week.   Specialty: Orthopedic Surgery Why: General reassessment of symptoms if not improving Contact information: [address] [facility] [place] [address] [phone]         NOTE: This note was prepared using Dragon dictation software along with smaller phrase technology. Despite my best ability to proofread, there is the potential that transcriptional errors may still occur from this process, and are completely unintentional.    Karen Kitchens, NP 04/07/19 1436

## 2019-04-06 NOTE — Discharge Instructions (Addendum)
It was very nice seeing you today in clinic. Thank you for entrusting me with your care.   Rest and increase fluid intake. Please utilize the medications that we discussed. Your prescriptions has been called in to your pharmacy. Apply moist heat 3-4 times a day for at least 15-20 minutes at a time.  You were tested for SARS-CoV-2 (novel coronavirus) today. Testing is performed by an outside lab ([facility]) and has variable turn around times ranging between 2-5 days. Current recommendations from the the CDC and [place] DHHS require that you stay at home in order to quarantine until negative test results are have been received. In the event that your test results are positive, you will be contacted with further directives. These measures are being implemented out of an abundance of caution to prevent transmission and spread during the current SARS-CoV-2 pandemic.  Make arrangements to follow up with your regular doctor for your respiratory complaints, and with orthopedics for your shoulder, in 1 week for re-evaluation if not improving. I have provided you the name and office contact information for an excellent local provider. If your symptoms/condition worsens, please seek follow up care either here or in the ER. Please remember, our [place] providers are "right here with you" when you need [place].   Again, it was my pleasure to take care of you today. Thank you for choosing our clinic. I hope that you start to feel better quickly.   Honor Loh, MSN, APRN, FNP-C, CEN Advanced Practice Provider [facility]

## 2019-04-06 NOTE — ED Triage Notes (Signed)
Pt c/o sore throat, cough, headache, subjective fever, and nasal congestion. Started about 2 days ago. Pt has known asthma. She also c/o right shoulder pain. She states that she fell about a month ago and hit the inside of her door at home. She can barely lift her arm.

## 2019-04-07 LAB — NOVEL CORONAVIRUS, NAA (HOSP ORDER, SEND-OUT TO REF LAB; TAT 18-24 HRS): SARS-CoV-2, NAA: NOT DETECTED

## 2019-04-08 LAB — CULTURE, GROUP A STREP (THRC)

## 2019-04-09 ENCOUNTER — Ambulatory Visit
Admission: EM | Admit: 2019-04-09 | Discharge: 2019-04-09 | Disposition: A | Discharge status: Home / Self Care | Payer: PPO | Attending: Family Medicine | Admitting: Family Medicine

## 2019-04-09 ENCOUNTER — Encounter: Payer: Self-pay | Admitting: Emergency Medicine

## 2019-04-09 ENCOUNTER — Other Ambulatory Visit: Payer: Self-pay

## 2019-04-09 DIAGNOSIS — J069 Acute upper respiratory infection, unspecified: Secondary | ICD-10-CM

## 2019-04-09 DIAGNOSIS — J0100 Acute maxillary sinusitis, unspecified: Secondary | ICD-10-CM

## 2019-04-09 MED ORDER — PREDNISONE 20 MG PO TABS: 40.0000 mg | ORAL_TABLET | Freq: Every day | ORAL | 0 refills | Status: DC

## 2019-04-09 NOTE — ED Provider Notes (Signed)
[facility] ____________________________________________  Time seen: Approximately 9:22 AM  I have reviewed the triage vital signs and the nursing notes.   HISTORY  Chief Complaint Cough (APPT), Headache, and Otalgia  HPI Dana Obrien is a 73 y.o. female with complaints of 6 days total of runny nose, nasal congestion and cough.  States cough is not too bothersome.  States she has had a lot of sinus congestion and sinus drainage with intermittent ear pressure discomfort.  Patient was seen in urgent care for the same complaints 3 days ago.  Was started on oral Augmentin, had a negative strep test and negative Covid test, but presented today as symptoms have continued.  Also taken home Zyrtec.  Patient reports history of chronic recurrent sinusitis.  Denies chest pain or shortness of breath, fevers.  Has continued to eat and drink well.  Continues remain active.  Denies other aggravating alleviating factors.  Leone Haven, MD : PCP    Past Medical History:  Diagnosis Date  . Anxiety   . Arthritis   . Asthma   . Breast cancer ([place]) 2004   right breast  . Breast cancer ([place]) 09/08/2017   left breast  . Breast cancer of upper-outer quadrant of left female breast ([place]) 09/17/2017   T1c, N0, ER 90%, PR 90%, 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  . Cancer [facility]) 2004   right lumpectomy, chemotherapy and radiation Dr. Jeb Levering  . Depression   . GERD (gastroesophageal reflux disease)   . Hypertension   . Migraine   . Personal history of chemotherapy 2004  . Personal history of radiation therapy 2004  . Personal history of radiation therapy 2019  . PONV (postoperative nausea and vomiting)   . Shingles   . Sleep apnea    uses CPAP, severe OSA  . Vitamin D deficiency    IN THE PAST    Patient Active Problem List   Diagnosis Date Noted  . Bilateral hearing loss 08/15/2018  . Fatigue 08/15/2018  . Chest tightness 06/02/2018  . Prediabetes 06/02/2018  . Arthralgia  06/02/2018  . Congestion of both ears 06/02/2018  . Malignant neoplasm of upper-outer quadrant of left breast in female, estrogen receptor positive ([place]) 09/14/2017  . Radiculopathy 08/24/2017  . Fatty liver 08/24/2017  . Abdominal pain 08/24/2017  . Hearing difficulty of both ears 06/25/2017  . Chronic sinusitis 11/05/2016  . GERD (gastroesophageal reflux disease) 09/07/2016  . Cough 07/08/2016  . Exertional shortness of breath 01/15/2016  . OSA (obstructive sleep apnea) 11/01/2015  . Low back pain 01/01/2015  . Varicose vein of leg 11/22/2014  . Post herpetic neuralgia 03/06/2014  . Cystocele 10/25/2013  . Skin lesion 10/25/2013  . Obesity, unspecified 10/19/2012  . History of breast cancer 02/11/2012  . Hyperlipidemia 05/11/2011  . Osteopenia 05/11/2011  . Anxiety and depression 03/04/2011  . Hypertension 03/04/2011    Past Surgical History:  Procedure Laterality Date  . ABDOMINAL HYSTERECTOMY    . ANTERIOR AND POSTERIOR REPAIR    . BREAST BIOPSY Right    stereo prior to lumpectomy  . BREAST BIOPSY Left 09/07/2017   US guided biopsy/positive- invasaive mammary carcinoma  . 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  3.5 cm maximum diameter, minimal margins 5 mm.  In situ component less than 10%.  [facility] BREAST LUMPECTOMY Right 2004  . BREAST LUMPECTOMY Left 09/17/2017   Procedure: BREAST EXCISION, SENTINEL NODE BIOPSY;  Surgeon: Robert Bellow, MD;  Location:  [facility];  Service: General;  Laterality: Left;  . COLONOSCOPY  2015  . ETHMOIDECTOMY Bilateral 11/05/2016   Procedure: ETHMOIDECTOMY;  Surgeon: Melida Quitter, MD;  Location: [place];  Service: ENT;  Laterality: Bilateral;  . MAXILLARY ANTROSTOMY Bilateral 11/05/2016   Procedure: MAXILLARY ANTROSTOMY;  Surgeon: Melida Quitter, MD;  Location: [place];  Service: ENT;  Laterality: Bilateral;  . SINUS ENDO W/FUSION Bilateral 11/05/2016   Procedure: FRONTAL RECESS  EXPLORATION;  Surgeon: Melida Quitter, MD;  Location: [place];  Service: ENT;  Laterality: Bilateral;  BILATERAL ENDOSCOPIC SINUS SURGERY WITH FUSION  . SINUS EXPLORATION  11/05/2016  . SPHENOIDECTOMY Bilateral 11/05/2016   Procedure: SPHENOIDECTOMY;  Surgeon: Melida Quitter, MD;  Location: [place];  Service: ENT;  Laterality: Bilateral;     No current facility-administered medications for this encounter.   Current Outpatient Medications:  .  albuterol (VENTOLIN HFA) 108 (90 Base) MCG/ACT inhaler, INHALE 2 PUFFS BY MOUTH EVERY 6 HOURS AS NEEDED, Disp: 9 g, Rfl: 0 .  amoxicillin-clavulanate (AUGMENTIN) 875-125 MG tablet, Take 1 tablet by mouth 2 (two) times daily for 7 days., Disp: 14 tablet, Rfl: 0 .  DULoxetine (CYMBALTA) 30 MG capsule, Take 1 capsule (30 mg) by mouth daily for 7 days, then increase to 2 capsules (60 mg) by mouth daily, Disp: 180 capsule, Rfl: 3 .  EQ ALLERGY RELIEF, CETIRIZINE, 10 MG tablet, Take 1 tablet by mouth once daily, Disp: 90 tablet, Rfl: 0 .  fluticasone (FLONASE) 50 MCG/ACT nasal spray, Place 1 spray into both nostrils daily., Disp: 16 g, Rfl: 5 .  hydrochlorothiazide (HYDRODIURIL) 12.5 MG tablet, Take 1 tablet (12.5 mg total) by mouth daily., Disp: 90 tablet, Rfl: 3 .  hydrOXYzine (ATARAX/VISTARIL) 10 MG tablet, Take 1 tablet (10 mg total) by mouth 3 (three) times daily as needed for itching., Disp: 90 tablet, Rfl: 1 .  letrozole (FEMARA) 2.5 MG tablet, Take 1 tablet by mouth once daily, Disp: 90 tablet, Rfl: 0 .  Multiple Vitamin (MULTI-VITAMIN) tablet, Take by mouth., Disp: , Rfl:  .  pantoprazole (PROTONIX) 40 MG tablet, Take 1 tablet (40 mg total) by mouth daily., Disp: 90 tablet, Rfl: 2 .  rosuvastatin (CRESTOR) 20 MG tablet, TAKE 1 TABLET BY MOUTH ONCE DAILY, Disp: 90 tablet, Rfl: 3 .  senna (SENOKOT) 8.6 MG TABS tablet, Take 1 tablet by mouth at bedtime. , Disp: , Rfl:  .  traMADol (ULTRAM) 50 MG tablet, Take 1 tablet (50 mg total) by mouth every 6 (six) hours as  needed. for pain, Disp: 30 tablet, Rfl: 0 .  tretinoin (RETIN-A) 0.05 % cream, Apply 1 application topically at bedtime., Disp: 45 g, Rfl: 0 .  predniSONE (DELTASONE) 20 MG tablet, Take 2 tablets (40 mg total) by mouth daily., Disp: 10 tablet, Rfl: 0  Allergies Lidocaine, Codeine, Morphine, Niacin, and Niacin and related  Family History  Problem Relation Age of Onset  . Heart disease Mother   . Hyperlipidemia Mother   . Hypertension Mother   . Hypertension Father   . Osteoarthritis Father   . Stroke Father   . Alzheimer's disease Sister   . Alzheimer's disease Brother   . Breast cancer Neg Hx     Social History Social History   Tobacco Use  . Smoking status: Never Smoker  . Smokeless tobacco: Never Used  Substance Use Topics  . Alcohol use: Not Currently    Comment: rarely  . Drug use: No    Review of Systems Constitutional: No fever Eyes:  No visual changes. ENT: No sore throat. Positive congestion.  Cardiovascular: Denies chest pain. Respiratory: Denies shortness of breath. Gastrointestinal: No abdominal pain.   Musculoskeletal: Negative for back pain. Skin: Negative for rash. Neurological: Negative for  focal weakness or numbness.   ____________________________________________   PHYSICAL EXAM:  VITAL SIGNS: ED Triage Vitals [04/09/19 0847]  Enc Vitals Group     BP (!) 159/81     Pulse Rate 88     Resp 18     Temp 98.9 F (37.2 C)     Temp Source Oral     SpO2 99 %     Weight 200 lb (90.7 kg)     Height _0  (1.626 m)     Head Circumference      Peak Flow      Pain Score 9     Pain Loc      Pain Edu?      Excl. in [place]?     Constitutional: Alert and oriented. Well appearing and in no acute distress. Eyes: Conjunctivae are normal. PERRL. EOMI. Head: Atraumatic.Mild to moderate tenderness to palpation bilateral frontal and maxillary sinuses. No swelling. No erythema.   Ears: no erythema, normal TMs bilaterally.   Nose: nasal congestion with  bilateral nasal turbinate erythema and edema.   Mouth/Throat: Mucous membranes are moist.  Oropharynx non-erythematous.No tonsillar swelling or exudate.  Neck: No stridor.  No cervical spine tenderness to palpation. Hematological/Lymphatic/Immunilogical: No cervical lymphadenopathy. Cardiovascular: Normal rate, regular rhythm. Grossly normal heart sounds.  Good peripheral circulation. Respiratory: Normal respiratory effort.  No retractions.  No wheezes, rales or rhonchi. Good air movement.  Musculoskeletal: Steady gait.  Neurologic:  Normal speech and language. No gross focal neurologic deficits are appreciated. No gait instability. Skin:  Skin is warm, dry and intact. No rash noted. Psychiatric: Mood and affect are normal. Speech and behavior are normal.  ___________________________________________   LABS (all labs ordered are listed, but only abnormal results are displayed)  Labs Reviewed - No data to display   PROCEDURES Procedures   INITIAL IMPRESSION / ASSESSMENT AND PLAN / ED COURSE  Pertinent labs & imaging results that were available during my care of the patient were reviewed by me and considered in my medical decision making (see chart for details).  Well-appearing patient.  No acute distress.  Suspect patient has a viral sinusitis and viral upper respiratory infection.  Patient to continue her antihistamine supportive care, rest, fluids and may continue her oral Augmentin that she is currently taking and will Rx prednisone.  Encouraged rest, fluids, supportive care and close monitoring.Discussed indication, risks and benefits of medications with patient.   Discussed follow up with Primary care physician this week. Discussed follow up and return parameters including no resolution or any worsening concerns. Patient verbalized understanding and agreed to plan.   ____________________________________________   FINAL CLINICAL IMPRESSION(S) / ED DIAGNOSES  Final diagnoses:   Upper respiratory tract infection, unspecified type  Subacute maxillary sinusitis     ED Discharge Orders         Ordered    predniSONE (DELTASONE) 20 MG tablet  Daily     04/09/19 0912           Note: This dictation was prepared with Dragon dictation along with smaller phrase technology. Any transcriptional errors that result from this process are unintentional.         Marylene Land, NP 04/09/19 1014

## 2019-04-09 NOTE — Discharge Instructions (Addendum)
Take medication as prescribed. Rest. Drink plenty of fluids. Monitor. Continue Zyrtec.   Follow up with your primary care physician this week as needed. Return to Urgent care for new or worsening concerns.

## 2019-04-09 NOTE — ED Triage Notes (Signed)
Patient in today c/o continued cough, headache and otalgia x 5 days. Patient was seen on 04/06/19 for same. Covid, strep was negative. Patient was prescribed Amox and still taking it.

## 2019-04-10 ENCOUNTER — Emergency Department
Admission: EM | Admit: 2019-04-10 | Discharge: 2019-04-10 | Disposition: A | Discharge status: Home / Self Care | Payer: PPO | Attending: Emergency Medicine | Admitting: Emergency Medicine

## 2019-04-10 ENCOUNTER — Other Ambulatory Visit: Payer: Self-pay

## 2019-04-10 ENCOUNTER — Emergency Department: Payer: PPO

## 2019-04-10 ENCOUNTER — Encounter: Payer: Self-pay | Admitting: Emergency Medicine

## 2019-04-10 DIAGNOSIS — J45909 Unspecified asthma, uncomplicated: Secondary | ICD-10-CM | POA: Diagnosis not present

## 2019-04-10 DIAGNOSIS — Z79899 Other long term (current) drug therapy: Secondary | ICD-10-CM | POA: Insufficient documentation

## 2019-04-10 DIAGNOSIS — Z853 Personal history of malignant neoplasm of breast: Secondary | ICD-10-CM | POA: Insufficient documentation

## 2019-04-10 DIAGNOSIS — I10 Essential (primary) hypertension: Secondary | ICD-10-CM | POA: Diagnosis not present

## 2019-04-10 DIAGNOSIS — R112 Nausea with vomiting, unspecified: Secondary | ICD-10-CM | POA: Insufficient documentation

## 2019-04-10 DIAGNOSIS — G43909 Migraine, unspecified, not intractable, without status migrainosus: Secondary | ICD-10-CM | POA: Diagnosis not present

## 2019-04-10 DIAGNOSIS — R519 Headache, unspecified: Secondary | ICD-10-CM | POA: Diagnosis not present

## 2019-04-10 LAB — COMPREHENSIVE METABOLIC PANEL
ALT: 20 U/L (ref 0–44)
AST: 25 U/L (ref 15–41)
Albumin: 4.4 g/dL (ref 3.5–5.0)
Alkaline Phosphatase: 77 U/L (ref 38–126)
Anion gap: 11 (ref 5–15)
BUN: 16 mg/dL (ref 8–23)
CO2: 25 mmol/L (ref 22–32)
Calcium: 9.6 mg/dL (ref 8.9–10.3)
Chloride: 99 mmol/L (ref 98–111)
Creatinine, Ser: 0.59 mg/dL (ref 0.44–1.00)
GFR calc Af Amer: >60 mL/min (ref >60)
GFR calc non Af Amer: >60 mL/min (ref >60)
Glucose, Bld: 130 mg/dL — ABNORMAL HIGH (ref 70–99)
Potassium: 4.1 mmol/L (ref 3.5–5.1)
Sodium: 135 mmol/L (ref 135–145)
Total Bilirubin: 1 mg/dL (ref 0.3–1.2)
Total Protein: 8.7 g/dL — ABNORMAL HIGH (ref 6.5–8.1)

## 2019-04-10 LAB — CBC WITH DIFFERENTIAL/PLATELET
Abs Immature Granulocytes: 0.06 10*3/uL (ref 0.00–0.07)
Basophils Absolute: 0 10*3/uL (ref 0.0–0.1)
Basophils Relative: 0 %
Eosinophils Absolute: 0 10*3/uL (ref 0.0–0.5)
Eosinophils Relative: 0 %
HCT: 39.8 % (ref 36.0–46.0)
Hemoglobin: 13.7 g/dL (ref 12.0–15.0)
Immature Granulocytes: 1 %
Lymphocytes Relative: 16 %
Lymphs Abs: 1.5 10*3/uL (ref 0.7–4.0)
MCH: 31.3 pg (ref 26.0–34.0)
MCHC: 34.4 g/dL (ref 30.0–36.0)
MCV: 90.9 fL (ref 80.0–100.0)
Monocytes Absolute: 0.6 10*3/uL (ref 0.1–1.0)
Monocytes Relative: 6 %
Neutro Abs: 7.2 10*3/uL (ref 1.7–7.7)
Neutrophils Relative %: 77 %
Platelets: 269 10*3/uL (ref 150–400)
RBC: 4.38 MIL/uL (ref 3.87–5.11)
RDW: 13 % (ref 11.5–15.5)
WBC: 9.4 10*3/uL (ref 4.0–10.5)
nRBC: 0 % (ref 0.0–0.2)

## 2019-04-10 LAB — SEDIMENTATION RATE: Sed Rate: 18 mm/hr (ref 0–30)

## 2019-04-10 LAB — MAGNESIUM: Magnesium: 2.1 mg/dL (ref 1.7–2.4)

## 2019-04-10 LAB — LIPASE, BLOOD: Lipase: 25 U/L (ref 11–51)

## 2019-04-10 MED ORDER — ONDANSETRON 4 MG PO TBDP: ORAL_TABLET | ORAL | 0 refills | Status: DC

## 2019-04-10 MED ORDER — KETOROLAC TROMETHAMINE 30 MG/ML IJ SOLN
15.0000 mg | Freq: Once | INTRAMUSCULAR | Status: AC
  Administered 2019-04-10: 15 mg via INTRAVENOUS
  Filled 2019-04-10: qty 1

## 2019-04-10 MED ORDER — SODIUM CHLORIDE 0.9 % IV BOLUS
500.0000 mL | Freq: Once | INTRAVENOUS | Status: AC
  Administered 2019-04-10: 500 mL via INTRAVENOUS

## 2019-04-10 MED ORDER — DROPERIDOL 2.5 MG/ML IJ SOLN
2.5000 mg | Freq: Once | INTRAMUSCULAR | Status: AC
  Administered 2019-04-10: 2.5 mg via INTRAVENOUS
  Filled 2019-04-10: qty 2

## 2019-04-10 MED ORDER — HYDROCODONE-ACETAMINOPHEN 5-325 MG PO TABS
1.0000 | ORAL_TABLET | Freq: Once | ORAL | Status: AC
  Administered 2019-04-10: 06:00:00 1 via ORAL
  Filled 2019-04-10: qty 1

## 2019-04-10 MED ORDER — SUMATRIPTAN 20 MG/ACT NA SOLN: 1.0000 | NASAL | 2 refills | Status: DC | PRN

## 2019-04-10 MED ORDER — DEXAMETHASONE SODIUM PHOSPHATE 10 MG/ML IJ SOLN
10.0000 mg | Freq: Once | INTRAMUSCULAR | Status: AC
  Administered 2019-04-10: 10 mg via INTRAVENOUS
  Filled 2019-04-10: qty 1

## 2019-04-10 NOTE — ED Notes (Signed)
Patient transported to CT 

## 2019-04-10 NOTE — ED Triage Notes (Signed)
Patient states that she was diagnosed with a sinus infection on Thursday at Urgent Care. Patient states that she continued to feel worse and on Sunday went back to Urgent Care. Patient states that tonight she developed a severe headache about 22:30 with vomiting and light sensitivity. Patient has a history of migraines this feels similar but more severe. Patient reports her blood pressure was 204/100 at home.

## 2019-04-10 NOTE — ED Notes (Signed)
PT states she feels much better, just having a little residual pain across front of head.

## 2019-04-10 NOTE — ED Notes (Signed)
Report off to paige rn  

## 2019-04-10 NOTE — ED Notes (Addendum)
Pt room 9 via wheelchair.  Pt was seen at urgent care in [place] today.  Pt reports a headache and elevated blood pressure.  Vomiting tonight.  Pt wearing sunglasses.  No dizziness no chest pain or sob.  Pt alert   Speech clear.

## 2019-04-10 NOTE — ED Notes (Signed)
ED Provider at bedside. 

## 2019-04-10 NOTE — ED Provider Notes (Signed)
[facility] Emergency Department Provider Note  ____________________________________________   First MD Initiated Contact with Patient 04/10/19 [id number]     (approximate)  I have reviewed the triage vital signs and the nursing notes.   HISTORY  Chief Complaint Headache and Emesis    HPI Dana Obrien is a 73 y.o. female with medical history as listed below which includes a history of migraines although she says she has not had a migraine for about a year.  She presents by private vehicle tonight for evaluation of severe headache, elevated blood pressure, nausea, and vomiting.  She reports that it is the worst headache of her life and although she has had a headache for  "a while", it became severely worse tonight at around 10:30 PM.  She has sensitivity to light which makes the pain worse and she describes it as mostly a frontal headache, throbbing, and severe.  No neck pain.  No visual changes other than the sensitivity to light.  She says it feels similar to her regular migraines but is more severe.  She has no numbness nor tingling nor weakness in any of her extremities.  Her blood pressure was elevated at home when she checked it after she was already symptomatic.  She does not take blood thinners.  She takes sumatriptan for her migraines when needed but she has not needed to take it for quite a while.  Of note, she was diagnosed with a sinus infection about 4 days ago at urgent care and started on prednisone.  She was also tested for COVID-19 and the results were negative.  She has no sore throat, chest pain, shortness of breath, nor cough.        Past Medical History:  Diagnosis Date  . Anxiety   . Arthritis   . Asthma   . Breast cancer ([place]) 2004   right breast  . Breast cancer ([place]) 09/08/2017   left breast  . Breast cancer of upper-outer quadrant of left female breast ([place]) 09/17/2017   T1c, N0, ER 90%, PR 90%, 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  .  Cancer [facility]) 2004   right lumpectomy, chemotherapy and radiation Dr. Jeb Levering  . Depression   . GERD (gastroesophageal reflux disease)   . Hypertension   . Migraine   . Personal history of chemotherapy 2004  . Personal history of radiation therapy 2004  . Personal history of radiation therapy 2019  . PONV (postoperative nausea and vomiting)   . Shingles   . Sleep apnea    uses CPAP, severe OSA  . Vitamin D deficiency    IN THE PAST    Patient Active Problem List   Diagnosis Date Noted  . Bilateral hearing loss 08/15/2018  . Fatigue 08/15/2018  . Chest tightness 06/02/2018  . Prediabetes 06/02/2018  . Arthralgia 06/02/2018  . Congestion of both ears 06/02/2018  . Malignant neoplasm of upper-outer quadrant of left breast in female, estrogen receptor positive ([place]) 09/14/2017  . Radiculopathy 08/24/2017  . Fatty liver 08/24/2017  . Abdominal pain 08/24/2017  . Hearing difficulty of both ears 06/25/2017  . Chronic sinusitis 11/05/2016  . GERD (gastroesophageal reflux disease) 09/07/2016  . Cough 07/08/2016  . Exertional shortness of breath 01/15/2016  . OSA (obstructive sleep apnea) 11/01/2015  . Low back pain 01/01/2015  . Varicose vein of leg 11/22/2014  . Post herpetic neuralgia 03/06/2014  . Cystocele 10/25/2013  . Skin lesion 10/25/2013  . Obesity, unspecified 10/19/2012  . History of breast cancer  02/11/2012  . Hyperlipidemia 05/11/2011  . Osteopenia 05/11/2011  . Anxiety and depression 03/04/2011  . Hypertension 03/04/2011    Past Surgical History:  Procedure Laterality Date  . ABDOMINAL HYSTERECTOMY    . ANTERIOR AND POSTERIOR REPAIR    . BREAST BIOPSY Right    stereo prior to lumpectomy  . BREAST BIOPSY Left 09/07/2017   US guided biopsy/positive- invasaive mammary carcinoma  . 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  3.5 cm maximum diameter, minimal margins 5 mm.  In situ component less  than 10%.  Marland Kitchen BREAST LUMPECTOMY Right 2004  . BREAST LUMPECTOMY Left 09/17/2017   Procedure: BREAST EXCISION, SENTINEL NODE BIOPSY;  Surgeon: Robert Bellow, MD;  Location: [facility] OR[facility];  Service: General;  Laterality: Left;  . COLONOSCOPY  2015  . ETHMOIDECTOMY Bilateral 11/05/2016   Procedure: ETHMOIDECTOMY;  Surgeon: Melida Quitter, MD;  Location: [place];  Service: ENT;  Laterality: Bilateral;  . MAXILLARY ANTROSTOMY Bilateral 11/05/2016   Procedure: MAXILLARY ANTROSTOMY;  Surgeon: Melida Quitter, MD;  Location: [place];  Service: ENT;  Laterality: Bilateral;  . SINUS ENDO W/FUSION Bilateral 11/05/2016   Procedure: FRONTAL RECESS EXPLORATION;  Surgeon: Melida Quitter, MD;  Location: [place];  Service: ENT;  Laterality: Bilateral;  BILATERAL ENDOSCOPIC SINUS SURGERY WITH FUSION  . SINUS EXPLORATION  11/05/2016  . SPHENOIDECTOMY Bilateral 11/05/2016   Procedure: SPHENOIDECTOMY;  Surgeon: Melida Quitter, MD;  Location: [place];  Service: ENT;  Laterality: Bilateral;    Prior to Admission medications   Medication Sig Start Date End Date Taking? Authorizing Provider  albuterol (VENTOLIN HFA) 108 (90 Base) MCG/ACT inhaler INHALE 2 PUFFS BY MOUTH EVERY 6 HOURS AS NEEDED 11/16/18   Leone Haven, MD  amoxicillin-clavulanate (AUGMENTIN) 875-125 MG tablet Take 1 tablet by mouth 2 (two) times daily for 7 days. 04/06/19 04/13/19  Karen Kitchens, NP  DULoxetine (CYMBALTA) 30 MG capsule Take 1 capsule (30 mg) by mouth daily for 7 days, then increase to 2 capsules (60 mg) by mouth daily 06/09/18   Leone Haven, MD  EQ ALLERGY RELIEF, CETIRIZINE, 10 MG tablet Take 1 tablet by mouth once daily 04/04/19   Leone Haven, MD  fluticasone [facility]) 50 MCG/ACT nasal spray Place 1 spray into both nostrils daily. 08/16/18   Guse, Jacquelynn Cree, FNP  hydrochlorothiazide (HYDRODIURIL) 12.5 MG tablet Take 1 tablet (12.5 mg total) by mouth daily. 05/31/18   Leone Haven, MD  hydrOXYzine (ATARAX/VISTARIL) 10 MG tablet Take 1  tablet (10 mg total) by mouth 3 (three) times daily as needed for itching. 08/16/18   Jodelle Green, FNP  letrozole [facility]) 2.5 MG tablet Take 1 tablet by mouth once daily 03/13/19   Sindy Guadeloupe, MD  Multiple Vitamin (MULTI-VITAMIN) tablet Take by mouth.    [provider]  ondansetron (ZOFRAN ODT) 4 MG disintegrating tablet Allow 1-2 tablets to dissolve in your mouth every 8 hours as needed for nausea/vomiting 04/10/19   Hinda Kehr, MD  pantoprazole (PROTONIX) 40 MG tablet Take 1 tablet (40 mg total) by mouth daily. 06/09/18   Leone Haven, MD  predniSONE (DELTASONE) 20 MG tablet Take 2 tablets (40 mg total) by mouth daily. 04/09/19   Marylene Land, NP  rosuvastatin (CRESTOR) 20 MG tablet TAKE 1 TABLET BY MOUTH ONCE DAILY 01/20/18   Leone Haven, MD  senna (SENOKOT) 8.6 MG TABS tablet Take 1 tablet by mouth at bedtime.  [provider]  SUMAtriptan (IMITREX) 20 MG/ACT nasal spray Place 1 spray (20 mg total) into the nose as needed for migraine or headache. May repeat in 2 hours if headache persists or recurs. 04/10/19 04/09/20  Hinda Kehr, MD  traMADol (ULTRAM) 50 MG tablet Take 1 tablet (50 mg total) by mouth every 6 (six) hours as needed. for pain 03/27/19   Leone Haven, MD  tretinoin (RETIN-A) 0.05 % cream Apply 1 application topically at bedtime. 05/31/18   Leone Haven, MD    Allergies Lidocaine, Codeine, Morphine, Niacin, and Niacin and related  Family History  Problem Relation Age of Onset  . Heart disease Mother   . Hyperlipidemia Mother   . Hypertension Mother   . Hypertension Father   . Osteoarthritis Father   . Stroke Father   . Alzheimer's disease Sister   . Alzheimer's disease Brother   . Breast cancer Neg Hx     Social History Social History   Tobacco Use  . Smoking status: Never Smoker  . Smokeless tobacco: Never Used  Substance Use Topics  . Alcohol use: Not Currently    Comment: rarely  . Drug use: No     Review of Systems Constitutional: No fever/chills Eyes: Photophobia. ENT: No sore throat. Cardiovascular: Denies chest pain. Respiratory: Denies shortness of breath. Gastrointestinal: Nausea and vomiting.  No abdominal pain. Genitourinary: Negative for dysuria. Musculoskeletal: Negative for neck pain.  Negative for back pain. Integumentary: Negative for rash. Neurological: Severe frontal headache, similar to migraines but more severe.  Negative for focal weakness and numbness.   ____________________________________________   PHYSICAL EXAM:  VITAL SIGNS: ED Triage Vitals  Enc Vitals Group     BP 04/10/19 0236 (!) 155/87     Pulse Rate 04/10/19 0236 86     Resp 04/10/19 0236 18     Temp --      Temp src --      SpO2 04/10/19 0236 97 %     Weight 04/10/19 0237 90.7 kg (200 lb)     Height 04/10/19 0237 1.626 m (5' 4" )     Head Circumference --      Peak Flow --      Pain Score 04/10/19 0237 10     Pain Loc --      Pain Edu? --      Excl. in [place]? --     Constitutional: Alert and oriented.  Appears acutely uncomfortable and is vomiting intermittently. Eyes: Conjunctivae are normal.  Pupils are equal and reactive.  Extraocular motion intact. Head: Atraumatic. Nose: No congestion/rhinnorhea. Mouth/Throat: Patient is wearing a mask. Neck: No stridor.  No meningeal signs.   Cardiovascular: Normal rate, regular rhythm. Good peripheral circulation. Grossly normal heart sounds. Respiratory: Normal respiratory effort.  No retractions. Gastrointestinal: Soft and nontender. No distention.  Musculoskeletal: No lower extremity tenderness nor edema. No gross deformities of extremities. Neurologic:  Normal speech and language. No gross focal neurologic deficits are appreciated.  Good grip strength in major muscle group strength throughout upper and lower extremities.  No report of subjective sensory deficits. Skin:  Skin is warm, dry and intact. Psychiatric: Mood and affect are normal.  Speech and behavior are normal.  ____________________________________________   LABS (all labs ordered are listed, but only abnormal results are displayed)  Labs Reviewed  COMPREHENSIVE METABOLIC PANEL - Abnormal; Notable for the following components:      Result Value   Glucose, Bld 130 (*)    Total Protein 8.7 (*)  All other components within normal limits  MAGNESIUM  LIPASE, BLOOD  CBC WITH DIFFERENTIAL/PLATELET  SEDIMENTATION RATE   ____________________________________________  EKG  ED ECG REPORT I, Hinda Kehr, the attending physician, personally viewed and interpreted this ECG.  Date: 04/10/2019 EKG Time: 3:46 AM Rate: 76 Rhythm: normal sinus rhythm QRS Axis: Left axis deviation Intervals: normal ST/T Wave abnormalities: Non-specific ST segment / T-wave changes, but no clear evidence of acute ischemia. Narrative Interpretation: no definitive evidence of acute ischemia; does not meet STEMI criteria.   ____________________________________________  RADIOLOGY I, Hinda Kehr, personally viewed and evaluated these images (plain radiographs) as part of my medical decision making, as well as reviewing the written report by the radiologist.  ED MD interpretation: No acute abnormalities on noncontrast head CT  Official radiology report(s): Ct Head Wo Contrast  Result Date: 04/10/2019 CLINICAL DATA:  Headaches EXAM: CT HEAD WITHOUT CONTRAST TECHNIQUE: Contiguous axial images were obtained from the base of the skull through the vertex without intravenous contrast. COMPARISON:  None. FINDINGS: Brain: Mild atrophic changes are noted. No findings to suggest acute hemorrhage, acute infarction or space-occupying mass lesion are noted. Vascular: No hyperdense vessel or unexpected calcification. Skull: Normal. Negative for fracture or focal lesion. Sinuses/Orbits: No acute finding. Other: None. IMPRESSION: Mild atrophic changes without acute abnormality. Electronically Signed    By: Inez Catalina M.D.   On: 04/10/2019 03:27    ____________________________________________   PROCEDURES   Procedure(s) performed (including Critical Care):  Procedures   ____________________________________________   INITIAL IMPRESSION / MDM / [place] / ED COURSE  As part of my medical decision making, I reviewed the following data within the [place] History obtained from family, Nursing notes reviewed and incorporated, Labs reviewed , EKG interpreted , Old chart reviewed, Radiograph reviewed  and Notes from prior ED visits   Differential diagnosis includes, but is not limited to, migraine, intracranial bleed such as a subarachnoid hemorrhage, temporal arteritis, meningitis/encephalitis.  The patient is generally with well-appearing with normal vitals except for some hypertension, no tachycardia, no fever.  She has no meningeal signs at all including no neck pain or neck stiffness.  Her physical exam is reassuring with no neurological deficits.  I will obtain a stat head CT and treat empirically for migraine with the following medications: Droperidol 2.5 mg IV, Toradol 15 mg IV, 500 mL of normal saline, Decadron 10 mg IV.  I will then reassess and will discuss with her daughter when she arrives.  Lab work is pending      Clinical Course as of Apr 09 736  Mon Apr 10, 2019  0981 Normal noncontrast head CT.  Daughter is now at bedside.  I updated her and the patient.  Patient has received her migraine medications and we will monitor and I will continue to assess.  CT Head Wo Contrast [CF]  0402 The patient reported that she was feeling short of breath or could not take a deep breath.  She was slumped down in the bed a little bit.  I think this was mostly positional and I help to get her readjusted up in bed as well as readjusted her head support so that provides more of an open airway rather than compressing her neck in a flexed position.  Her daughter  reports that she uses a CPAP at night.  She is sleepy after the droperidol and I think that this is more positional and medication induced rather than a true "dyspnea".  Her  oxygen saturation is 97% with a good waveform.   [CF]  0403 Lab work is all reassuring.  Sedimentation rate is 18, normal mag, normal comprehensive metabolic panel, normal CBC with no leukocytosis, normal lipase.  Patient no longer feels nauseated and we will continue to monitor.   [CF]  [phone] The patient feels much better.  She still has a headache but she says she feels so much better than before, is smiling and not having to wear her sunglasses, and she said that she would like to go home.  For some minor residual headache I am giving her a one-time dose of Norco 1 tablet.  I am writing a prescription for Imitrex and Zofran.  They will follow-up with her primary care doctor.  No evidence of ongoing emergent medical condition at this time.  I gave my usual and customary return precautions.   [CF]    Clinical Course User Index [CF] Hinda Kehr, MD     ____________________________________________  FINAL CLINICAL IMPRESSION(S) / ED DIAGNOSES  Final diagnoses:  Migraine without status migrainosus, not intractable, unspecified migraine type  Non-intractable vomiting with nausea, unspecified vomiting type  Essential hypertension     MEDICATIONS GIVEN DURING THIS VISIT:  Medications  droperidol (INAPSINE) 2.5 MG/ML injection 2.5 mg (2.5 mg Intravenous Given 04/10/19 0327)  sodium chloride 0.9 % bolus 500 mL (0 mLs Intravenous Stopped 04/10/19 0647)  dexamethasone (DECADRON) injection 10 mg (10 mg Intravenous Given 04/10/19 0326)  ketorolac (TORADOL) 30 MG/ML injection 15 mg (15 mg Intravenous Given 04/10/19 0325)  HYDROcodone-acetaminophen (NORCO/VICODIN) 5-325 MG per tablet 1 tablet (1 tablet Oral Given 04/10/19 4174)     ED Discharge Orders         Ordered    SUMAtriptan (IMITREX) 20 MG/ACT nasal spray  As needed      04/10/19 0639    ondansetron (ZOFRAN ODT) 4 MG disintegrating tablet     04/10/19 0814          *Please note:  GLENNA BRUNKOW was evaluated in Emergency Department on 04/10/2019 for the symptoms described in the history of present illness. She was evaluated in the context of the global COVID-19 pandemic, which necessitated consideration that the patient might be at risk for infection with the SARS-CoV-2 virus that causes COVID-19. Institutional protocols and algorithms that pertain to the evaluation of patients at risk for COVID-19 are in a state of rapid change based on information released by regulatory bodies including the CDC and federal and state organizations. These policies and algorithms were followed during the patient's care in the ED.  Some ED evaluations and interventions may be delayed as a result of limited staffing during the pandemic.*  Note:  This document was prepared using Dragon voice recognition software and may include unintentional dictation errors.   Hinda Kehr, MD 04/10/19 [phone]

## 2019-04-10 NOTE — Discharge Instructions (Signed)
You have been seen in the Emergency Department (ED) for a migraine.  Please use Tylenol or Motrin as needed for symptoms, but only as written on the box, and take any regular medications that have been prescribed for you. As we have discussed, please follow up with your doctor as soon as possible regarding today's ED visit and your headache symptoms.    Call your doctor or return to the Emergency Department (ED) if you have a worsening headache, sudden and severe headache, confusion, slurred speech, facial droop, weakness or numbness in any arm or leg, extreme fatigue, or other symptoms that concern you.  

## 2019-04-22 ENCOUNTER — Other Ambulatory Visit: Payer: Self-pay

## 2019-04-22 ENCOUNTER — Encounter: Payer: Self-pay | Admitting: Emergency Medicine

## 2019-04-22 ENCOUNTER — Ambulatory Visit (INDEPENDENT_AMBULATORY_CARE_PROVIDER_SITE_OTHER): Payer: PPO

## 2019-04-22 ENCOUNTER — Ambulatory Visit
Admission: EM | Admit: 2019-04-22 | Discharge: 2019-04-22 | Disposition: A | Discharge status: Home / Self Care | Payer: PPO | Attending: Family Medicine | Admitting: Family Medicine

## 2019-04-22 DIAGNOSIS — M542 Cervicalgia: Secondary | ICD-10-CM | POA: Diagnosis not present

## 2019-04-22 DIAGNOSIS — M25511 Pain in right shoulder: Secondary | ICD-10-CM | POA: Diagnosis not present

## 2019-04-22 DIAGNOSIS — S199XXA Unspecified injury of neck, initial encounter: Secondary | ICD-10-CM | POA: Diagnosis not present

## 2019-04-22 DIAGNOSIS — M25512 Pain in left shoulder: Secondary | ICD-10-CM

## 2019-04-22 MED ORDER — TRAMADOL HCL 50 MG PO TABS: 50.0000 mg | ORAL_TABLET | Freq: Two times a day (BID) | ORAL | 0 refills | Status: DC | PRN

## 2019-04-22 MED ORDER — BACLOFEN 10 MG PO TABS: 5.0000 mg | ORAL_TABLET | Freq: Every evening | ORAL | 0 refills | Status: DC | PRN

## 2019-04-22 NOTE — ED Provider Notes (Signed)
[facility]    CSN: [id number] Arrival date & time: 04/22/19  0935  History   Chief Complaint Chief Complaint  Patient presents with  . Fall  . Shoulder Pain  . Neck Pain   HPI  73 year old female presents with the above complaints.  Patient reports that she suffered a fall on Thursday.  She states that she fell back after missing a step.  Fell directly back.  Hit her head.  Denies loss of consciousness.  No vision changes.  No nausea or vomiting.  Denies headache at this time.  Patient is currently experiencing neck pain as well as bilateral shoulder pain.  She has taken ibuprofen without relief.  Rates her pain as 8/10 in severity.  Seems to be exacerbated by activity/movement.  No relieving factors.  No other complaints.  PMH, Surgical Hx, Family Hx, Social History reviewed and updated as below.  Past Medical History:  Diagnosis Date  . Anxiety   . Arthritis   . Asthma   . Breast cancer ([place]) 2004   right breast  . Breast cancer ([place]) 09/08/2017   left breast  . Breast cancer of upper-outer quadrant of left female breast ([place]) 09/17/2017   T1c, N0, ER 90%, PR 90%, 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  . Cancer [facility]) 2004   right lumpectomy, chemotherapy and radiation Dr. Jeb Levering  . Depression   . GERD (gastroesophageal reflux disease)   . Hypertension   . Migraine   . Personal history of chemotherapy 2004  . Personal history of radiation therapy 2004  . Personal history of radiation therapy 2019  . PONV (postoperative nausea and vomiting)   . Shingles   . Sleep apnea    uses CPAP, severe OSA  . Vitamin D deficiency    IN THE PAST    Patient Active Problem List   Diagnosis Date Noted  . Bilateral hearing loss 08/15/2018  . Fatigue 08/15/2018  . Chest tightness 06/02/2018  . Prediabetes 06/02/2018  . Arthralgia 06/02/2018  . Congestion of both ears 06/02/2018  . Malignant neoplasm of upper-outer quadrant of left breast in female, estrogen receptor  positive ([place]) 09/14/2017  . Radiculopathy 08/24/2017  . Fatty liver 08/24/2017  . Abdominal pain 08/24/2017  . Hearing difficulty of both ears 06/25/2017  . Chronic sinusitis 11/05/2016  . GERD (gastroesophageal reflux disease) 09/07/2016  . Cough 07/08/2016  . Exertional shortness of breath 01/15/2016  . OSA (obstructive sleep apnea) 11/01/2015  . Low back pain 01/01/2015  . Varicose vein of leg 11/22/2014  . Post herpetic neuralgia 03/06/2014  . Cystocele 10/25/2013  . Skin lesion 10/25/2013  . Obesity, unspecified 10/19/2012  . History of breast cancer 02/11/2012  . Hyperlipidemia 05/11/2011  . Osteopenia 05/11/2011  . Anxiety and depression 03/04/2011  . Hypertension 03/04/2011    Past Surgical History:  Procedure Laterality Date  . ABDOMINAL HYSTERECTOMY    . ANTERIOR AND POSTERIOR REPAIR    . BREAST BIOPSY Right    stereo prior to lumpectomy  . BREAST BIOPSY Left 09/07/2017   US guided biopsy/positive- invasaive mammary carcinoma  . 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  3.5 cm maximum diameter, minimal margins 5 mm.  In situ component less than 10%.  Marland Kitchen BREAST LUMPECTOMY Right 2004  . BREAST LUMPECTOMY Left 09/17/2017   Procedure: BREAST EXCISION, SENTINEL NODE BIOPSY;  Surgeon: Robert Bellow, MD;  Location: [facility];  Service: General;  Laterality: Left;  . COLONOSCOPY  2015  . ETHMOIDECTOMY Bilateral 11/05/2016   Procedure: ETHMOIDECTOMY;  Surgeon: Melida Quitter, MD;  Location: [place];  Service: ENT;  Laterality: Bilateral;  . MAXILLARY ANTROSTOMY Bilateral 11/05/2016   Procedure: MAXILLARY ANTROSTOMY;  Surgeon: Melida Quitter, MD;  Location: [place];  Service: ENT;  Laterality: Bilateral;  . SINUS ENDO W/FUSION Bilateral 11/05/2016   Procedure: FRONTAL RECESS EXPLORATION;  Surgeon: Melida Quitter, MD;  Location: [place];  Service: ENT;  Laterality: Bilateral;  BILATERAL ENDOSCOPIC SINUS SURGERY WITH  FUSION  . SINUS EXPLORATION  11/05/2016  . SPHENOIDECTOMY Bilateral 11/05/2016   Procedure: SPHENOIDECTOMY;  Surgeon: Melida Quitter, MD;  [facility];  Service: ENT;  Laterality: Bilateral;    OB History    Gravida  2   Para      Term      Preterm      AB      Living        SAB      TAB      Ectopic      Multiple      Live Births  2        Obstetric Comments  1st Menstrual Cycle:  15 1st Pregnancy: 31           Home Medications    Prior to Admission medications   Medication Sig Start Date End Date Taking? Authorizing Provider  albuterol (VENTOLIN HFA) 108 (90 Base) MCG/ACT inhaler INHALE 2 PUFFS BY MOUTH EVERY 6 HOURS AS NEEDED 11/16/18  Yes Leone Haven, MD  DULoxetine (CYMBALTA) 30 MG capsule Take 1 capsule (30 mg) by mouth daily for 7 days, then increase to 2 capsules (60 mg) by mouth daily 06/09/18  Yes Leone Haven, MD  EQ ALLERGY RELIEF, CETIRIZINE, 10 MG tablet Take 1 tablet by mouth once daily 04/04/19  Yes Leone Haven, MD  fluticasone (FLONASE) 50 MCG/ACT nasal spray Place 1 spray into both nostrils daily. 08/16/18  Yes Guse, Jacquelynn Cree, FNP  hydrochlorothiazide (HYDRODIURIL) 12.5 MG tablet Take 1 tablet (12.5 mg total) by mouth daily. 05/31/18  Yes Leone Haven, MD  hydrOXYzine (ATARAX/VISTARIL) 10 MG tablet Take 1 tablet (10 mg total) by mouth 3 (three) times daily as needed for itching. 08/16/18  Yes Guse, Jacquelynn Cree, FNP  letrozole [facility]) 2.5 MG tablet Take 1 tablet by mouth once daily 03/13/19  Yes Sindy Guadeloupe, MD  Multiple Vitamin (MULTI-VITAMIN) tablet Take by mouth.   Yes [provider]  pantoprazole (PROTONIX) 40 MG tablet Take 1 tablet (40 mg total) by mouth daily. 06/09/18  Yes Leone Haven, MD  rosuvastatin (CRESTOR) 20 MG tablet TAKE 1 TABLET BY MOUTH ONCE DAILY 01/20/18  Yes Leone Haven, MD  senna (SENOKOT) 8.6 MG TABS tablet Take 1 tablet by mouth at bedtime.    Yes [provider]   SUMAtriptan (IMITREX) 20 MG/ACT nasal spray Place 1 spray (20 mg total) into the nose as needed for migraine or headache. May repeat in 2 hours if headache persists or recurs. 04/10/19 04/09/20 Yes Hinda Kehr, MD  baclofen (LIORESAL) 10 MG tablet Take 0.5-1 tablets (5-10 mg total) by mouth at bedtime as needed for muscle spasms. 04/22/19   Coral Spikes, DO  ondansetron (ZOFRAN ODT) 4 MG disintegrating tablet Allow 1-2 tablets to dissolve in your mouth every 8 hours as needed for nausea/vomiting 04/10/19   Hinda Kehr, MD  traMADol (ULTRAM) 50 MG tablet Take 1 tablet (50 mg total) by mouth every  12 (twelve) hours as needed. 04/22/19   Coral Spikes, DO  tretinoin (RETIN-A) 0.05 % cream Apply 1 application topically at bedtime. 05/31/18   Leone Haven, MD    Family History Family History  Problem Relation Age of Onset  . Heart disease Mother   . Hyperlipidemia Mother   . Hypertension Mother   . Hypertension Father   . Osteoarthritis Father   . Stroke Father   . Alzheimer's disease Sister   . Alzheimer's disease Brother   . Breast cancer Neg Hx     Social History Social History   Tobacco Use  . Smoking status: Never Smoker  . Smokeless tobacco: Never Used  Substance Use Topics  . Alcohol use: Not Currently    Comment: rarely  . Drug use: No     Allergies   Lidocaine, Codeine, Morphine, Niacin, and Niacin and related   Review of Systems Review of Systems  Constitutional: Negative.   Eyes: Negative.   Musculoskeletal: Positive for neck pain.       Shoulder pain.  Neurological: Negative.    Physical Exam Triage Vital Signs ED Triage Vitals  Enc Vitals Group     BP 04/22/19 0948 (!) 152/76     Pulse Rate 04/22/19 0948 86     Resp 04/22/19 0948 16     Temp 04/22/19 0948 98.2 F (36.8 C)     Temp Source 04/22/19 0948 Oral     SpO2 04/22/19 0948 97 %     Weight 04/22/19 0945 200 lb (90.7 kg)     Height 04/22/19 0945 5' 4"  (1.626 m)     Head Circumference --       Peak Flow --      Pain Score 04/22/19 0944 8     Pain Loc --      Pain Edu? --      Excl. in [place]? --    Updated Vital Signs BP (!) 152/76 (BP Location: Left Arm)   Pulse 86   Temp 98.2 F (36.8 C) (Oral)   Resp 16   Ht 5' 4"  (1.626 m)   Wt 90.7 kg   SpO2 97%   BMI 34.33 kg/m   Visual Acuity Right Eye Distance:   Left Eye Distance:   Bilateral Distance:    Right Eye Near:   Left Eye Near:    Bilateral Near:     Physical Exam Vitals signs and nursing note reviewed.  Constitutional:      General: She is not in acute distress.    Appearance: Normal appearance. She is obese. She is not ill-appearing.  HENT:     Head: Normocephalic and atraumatic.     Right Ear: Tympanic membrane normal.     Left Ear: Tympanic membrane normal.     Mouth/Throat:     Pharynx: Oropharynx is clear. No posterior oropharyngeal erythema.  Eyes:     General:        Right eye: No discharge.        Left eye: No discharge.     Extraocular Movements: Extraocular movements intact.     Conjunctiva/sclera: Conjunctivae normal.     Pupils: Pupils are equal, round, and reactive to light.  Cardiovascular:     Rate and Rhythm: Normal rate and regular rhythm.     Heart sounds: No murmur.  Pulmonary:     Effort: Pulmonary effort is normal.     Breath sounds: Normal breath sounds. No wheezing, rhonchi or rales.  Neurological:  General: No focal deficit present.     Mental Status: She is alert and oriented to person, place, and time.     Cranial Nerves: No cranial nerve deficit.     Sensory: No sensory deficit.     Motor: No weakness.  Psychiatric:        Mood and Affect: Mood normal.        Behavior: Behavior normal.    UC Treatments / Results  Labs (all labs ordered are listed, but only abnormal results are displayed) Labs Reviewed - No data to display  EKG   Radiology Dg Cervical Spine Complete  Result Date: 04/22/2019 CLINICAL DATA:  73 year old female with neck and shoulder  pain after falling down some stairs 2 days previously. EXAM: CERVICAL SPINE - COMPLETE 4+ VIEW COMPARISON:  Prior cervical spine radiographs 08/24/2017 FINDINGS: There is no evidence of acute fracture, malalignment or prevertebral soft tissue swelling. Stable multilevel degenerative cervical spondylosis beginning at C5-C6 and continuing through C6-C7 and C7-T1. Similar degree of mild anterolisthesis measuring approximately 3 mm of C7 on T1 bilateral facet arthropathy is again noted with evidence of foraminal narrowing at C5-C6, C6-C7 and C7-T1. IMPRESSION: 1. No evidence of acute fracture or malalignment. 2. Similar appearance of multi level degenerative cervical spondylosis and bilateral facet arthropathy with probable foraminal narrowing at C5-C6, C6-C7 and C7-T1. 3. Stable mild degenerative anterolisthesis of C7 on T1. Electronically Signed   By: Jacqulynn Cadet M.D.   On: 04/22/2019 10:34    Procedures Procedures (including critical care time)  Medications Ordered in UC Medications - No data to display  Initial Impression / Assessment and Plan / UC Course  I have reviewed the triage vital signs and the nursing notes.  Pertinent labs & imaging results that were available during my care of the patient were reviewed by me and considered in my medical decision making (see chart for details).    73 year old female presents with pain after suffering a fall.  Cervical spine x-ray negative for acute fracture.  Treating with tramadol and low-dose baclofen.  Supportive care.  Final Clinical Impressions(s) / UC Diagnoses   Final diagnoses:  Acute pain of both shoulders  Neck pain     Discharge Instructions     Rest.  Heat.  Medications as directed.  Take care  Dr. Lacinda Axon    ED Prescriptions    Medication Sig Dispense Auth. Provider   traMADol (ULTRAM) 50 MG tablet Take 1 tablet (50 mg total) by mouth every 12 (twelve) hours as needed. 10 tablet Rykar Lebleu G, DO   baclofen  (LIORESAL) 10 MG tablet Take 0.5-1 tablets (5-10 mg total) by mouth at bedtime as needed for muscle spasms. 10 each Coral Spikes, DO     PDMP not reviewed this encounter.   Coral Spikes, [place] 04/22/19 1056

## 2019-04-22 NOTE — Discharge Instructions (Signed)
Rest.  Heat.  Medications as directed.  Take care  Dr. Mikailah Morel  

## 2019-04-22 NOTE — ED Triage Notes (Signed)
Patient states that she fell after missing a step on her porch on Thursday.  Patient states that she fell back and hit her head on the sidewalk.  Patient c/o bilateral shoulder pain and neck pain. Patient denies HAs or LOC.

## 2019-05-16 ENCOUNTER — Telehealth: Payer: Self-pay | Admitting: Family Medicine

## 2019-05-16 ENCOUNTER — Encounter: Payer: Self-pay | Admitting: Family Medicine

## 2019-05-16 NOTE — Telephone Encounter (Signed)
I called pt twice and left vm to call ofc. °

## 2019-05-17 ENCOUNTER — Encounter: Payer: Self-pay | Admitting: Family Medicine

## 2019-05-17 ENCOUNTER — Ambulatory Visit (INDEPENDENT_AMBULATORY_CARE_PROVIDER_SITE_OTHER): Payer: PPO | Admitting: Family Medicine

## 2019-05-17 ENCOUNTER — Other Ambulatory Visit: Payer: Self-pay

## 2019-05-17 DIAGNOSIS — M2550 Pain in unspecified joint: Secondary | ICD-10-CM | POA: Diagnosis not present

## 2019-05-17 DIAGNOSIS — R61 Generalized hyperhidrosis: Secondary | ICD-10-CM

## 2019-05-17 DIAGNOSIS — F329 Major depressive disorder, single episode, unspecified: Secondary | ICD-10-CM | POA: Diagnosis not present

## 2019-05-17 DIAGNOSIS — I10 Essential (primary) hypertension: Secondary | ICD-10-CM | POA: Diagnosis not present

## 2019-05-17 DIAGNOSIS — F419 Anxiety disorder, unspecified: Secondary | ICD-10-CM

## 2019-05-17 DIAGNOSIS — G319 Degenerative disease of nervous system, unspecified: Secondary | ICD-10-CM | POA: Diagnosis not present

## 2019-05-17 DIAGNOSIS — F32A Depression, unspecified: Secondary | ICD-10-CM

## 2019-05-17 MED ORDER — HYDROCHLOROTHIAZIDE 25 MG PO TABS: 25.0000 mg | ORAL_TABLET | Freq: Every day | ORAL | 1 refills | Status: DC

## 2019-05-17 NOTE — Progress Notes (Signed)
Virtual Visit via telephone Note  This visit type was conducted due to national recommendations for restrictions regarding the COVID-19 pandemic (e.g. social distancing).  This format is felt to be most appropriate for this patient at this time.  All issues noted in this document were discussed and addressed.  No physical exam was performed (except for noted visual exam findings with Video Visits).   I connected with Dana Obrien today at  4:00 PM EST by telephone and verified that I am speaking with the correct person using two identifiers. Location patient: home Location provider: work  Persons participating in the virtual visit: patient, provider  I discussed the limitations, risks, security and privacy concerns of performing an evaluation and management service by telephone and the availability of in person appointments. I also discussed with the patient that there may be a patient responsible charge related to this service. The patient expressed understanding and agreed to proceed.  Interactive audio and video telecommunications were attempted between this provider and patient, however failed, due to patient having technical difficulties OR patient did not have access to video capability.  We continued and completed visit with audio only.   Reason for visit: follow-up  HPI: Anxiety/depression: She notes she feels anxious and depressed. She has been getting really ill with her family members and argues over nothing. She notes lots of stress with her daughter living with her and her taking care of her grandson. She notes trouble remembering things. She forgets where she parks. She put stuff away and does not remember where they are. Has is issues daily. She is concerned over her health. Her brother recently passed away and that is playing a role. No SI or HI. Currently on Cymbalta though she is unsure if that has been beneficial recently. She notes she can occasionally feel her heartbeat  at night though does not feel as though it is racing or there are palpitations.  Night sweats: Patient notes these started about 3 months ago. They occur most nights. She does have to occasionally change her clothes. She keeps it at 66-68 F in her house. She does sleep with a down comforter. She notes no weight loss or itching. She is on letrozole. She does have a history of breast cancer. She follows with oncology.  Chronic joint pain: Notes this occurs in her neck, shoulders, and arms. She was found to have degenerative changes in her neck. She feels stiff and tight. Currently takes Celebrex and is unsure if that is beneficial.  Hypertension: It was 155/77 most recently with a pulse of 88. She ended up in the ED with a headache and they did a CT scan of her head which showed mild atrophic changes. The gave her Imitrex for the headache which was beneficial. She has had no chest pain. She has had no dyspnea. She does take HCTZ.   ROS: See pertinent positives and negatives per HPI.  Past Medical History:  Diagnosis Date  . Anxiety   . Arthritis   . Asthma   . Breast cancer ([place]) 2004   right breast  . Breast cancer ([place]) 09/08/2017   left breast  . Breast cancer of upper-outer quadrant of left female breast ([place]) 09/17/2017   T1c, N0, ER 90%, PR 90%, 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  . Cancer [facility]) 2004   right lumpectomy, chemotherapy and radiation Dr. Jeb Levering  . Depression   . GERD (gastroesophageal reflux disease)   . Hypertension   . Migraine   . Personal  history of chemotherapy 2004  . Personal history of radiation therapy 2004  . Personal history of radiation therapy 2019  . PONV (postoperative nausea and vomiting)   . Shingles   . Sleep apnea    uses CPAP, severe OSA  . Vitamin D deficiency    IN THE PAST    Past Surgical History:  Procedure Laterality Date  . ABDOMINAL HYSTERECTOMY    . ANTERIOR AND POSTERIOR REPAIR    . BREAST BIOPSY Right    stereo prior to lumpectomy   . BREAST BIOPSY Left 09/07/2017   US guided biopsy/positive- invasaive mammary carcinoma  . 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  3.5 cm maximum diameter, minimal margins 5 mm.  In situ component less than 10%.  Marland Kitchen BREAST LUMPECTOMY Right 2004  . BREAST LUMPECTOMY Left 09/17/2017   Procedure: BREAST EXCISION, SENTINEL NODE BIOPSY;  Surgeon: Robert Bellow, MD;  Location: [facility];  Service: General;  Laterality: Left;  . COLONOSCOPY  2015  . ETHMOIDECTOMY Bilateral 11/05/2016   Procedure: ETHMOIDECTOMY;  Surgeon: Melida Quitter, MD;  Location: [place];  Service: ENT;  Laterality: Bilateral;  . MAXILLARY ANTROSTOMY Bilateral 11/05/2016   Procedure: MAXILLARY ANTROSTOMY;  Surgeon: Melida Quitter, MD;  Location: [place];  Service: ENT;  Laterality: Bilateral;  . SINUS ENDO W/FUSION Bilateral 11/05/2016   Procedure: FRONTAL RECESS EXPLORATION;  Surgeon: Melida Quitter, MD;  Location: [place];  Service: ENT;  Laterality: Bilateral;  BILATERAL ENDOSCOPIC SINUS SURGERY WITH FUSION  . SINUS EXPLORATION  11/05/2016  . SPHENOIDECTOMY Bilateral 11/05/2016   Procedure: SPHENOIDECTOMY;  Surgeon: Melida Quitter, MD;  Location[facility];  Service: ENT;  Laterality: Bilateral;    Family History  Problem Relation Age of Onset  . Heart disease Mother   . Hyperlipidemia Mother   . Hypertension Mother   . Hypertension Father   . Osteoarthritis Father   . Stroke Father   . Alzheimer's disease Sister   . Alzheimer's disease Brother   . Breast cancer Neg Hx     SOCIAL HX: Non-smoker.   Current Outpatient Medications:  .  albuterol (VENTOLIN HFA) 108 (90 Base) MCG/ACT inhaler, INHALE 2 PUFFS BY MOUTH EVERY 6 HOURS AS NEEDED, Disp: 9 g, Rfl: 0 .  baclofen (LIORESAL) 10 MG tablet, Take 0.5-1 tablets (5-10 mg total) by mouth at bedtime as needed for muscle spasms., Disp: 10 each, Rfl: 0 .  DULoxetine (CYMBALTA) 30 MG capsule, Take 1 capsule (30 mg) by  mouth daily for 7 days, then increase to 2 capsules (60 mg) by mouth daily, Disp: 180 capsule, Rfl: 3 .  EQ ALLERGY RELIEF, CETIRIZINE, 10 MG tablet, Take 1 tablet by mouth once daily, Disp: 90 tablet, Rfl: 0 .  fluticasone (FLONASE) 50 MCG/ACT nasal spray, Place 1 spray into both nostrils daily., Disp: 16 g, Rfl: 5 .  hydrochlorothiazide (HYDRODIURIL) 25 MG tablet, Take 1 tablet (25 mg total) by mouth daily., Disp: 90 tablet, Rfl: 1 .  hydrOXYzine (ATARAX/VISTARIL) 10 MG tablet, Take 1 tablet (10 mg total) by mouth 3 (three) times daily as needed for itching., Disp: 90 tablet, Rfl: 1 .  letrozole (FEMARA) 2.5 MG tablet, Take 1 tablet by mouth once daily, Disp: 90 tablet, Rfl: 0 .  Multiple Vitamin (MULTI-VITAMIN) tablet, Take by mouth., Disp: , Rfl:  .  ondansetron (ZOFRAN ODT) 4 MG disintegrating tablet, Allow 1-2 tablets to dissolve in your mouth every 8 hours as needed for nausea/vomiting, Disp:  30 tablet, Rfl: 0 .  pantoprazole (PROTONIX) 40 MG tablet, Take 1 tablet (40 mg total) by mouth daily., Disp: 90 tablet, Rfl: 2 .  rosuvastatin (CRESTOR) 20 MG tablet, TAKE 1 TABLET BY MOUTH ONCE DAILY, Disp: 90 tablet, Rfl: 3 .  senna (SENOKOT) 8.6 MG TABS tablet, Take 1 tablet by mouth at bedtime. , Disp: , Rfl:  .  SUMAtriptan (IMITREX) 20 MG/ACT nasal spray, Place 1 spray (20 mg total) into the nose as needed for migraine or headache. May repeat in 2 hours if headache persists or recurs., Disp: 1 Inhaler, Rfl: 2 .  traMADol (ULTRAM) 50 MG tablet, Take 1 tablet (50 mg total) by mouth every 12 (twelve) hours as needed., Disp: 10 tablet, Rfl: 0 .  tretinoin (RETIN-A) 0.05 % cream, Apply 1 application topically at bedtime., Disp: 45 g, Rfl: 0  EXAM: This was a telehealth telephone visit and thus no physical exam was completed.  ASSESSMENT AND PLAN:  Discussed the following assessment and plan:  Hypertension Uncontrolled. Increase hydrochlorothiazide to 25 mg daily. She will return in 1 month for  follow-up with me in labs.  Brain atrophy ([place]) Mild brain atrophy noted on CT scan. Discussed that some mild changes are relatively common as people age. Discussed considering further work-up for memory difficulty relating to this if this does not improve with improvement in anxiety and depression.  Anxiety and depression Patient is having fairly consistent issues with this recently. I suspect this is contributing to her memory difficulty. We will transition her from Cymbalta to Zoloft. I will check with her clinical pharmacist on the best way to do this. Follow-up in 1 month.  Arthralgia Chronic issues. Suspect osteoarthritis. Discussed that other than pain control there is not a whole lot to do for osteoarthritic pain. Discussed that her letrozole could be playing a role as well. We will check with her oncologist regarding that.  Night sweats Intermittent night sweats over the last 3 months. Some have been drenching. Discussed this could be a medication side effect and we will change her Cymbalta to an alternative medication. We will check with her oncologist regarding the letrozole and let them know about her night sweats given her history of breast cancer. Plan for basic labs in 1 month at follow-up.    I discussed the assessment and treatment plan with the patient. The patient was provided an opportunity to ask questions and all were answered. The patient agreed with the plan and demonstrated an understanding of the instructions.   The patient was advised to call back or seek an in-person evaluation if the symptoms worsen or if the condition fails to improve as anticipated.  I provided 30 minutes of non-face-to-face time during this encounter.   [name], MD

## 2019-05-18 ENCOUNTER — Encounter: Payer: Self-pay | Admitting: Family Medicine

## 2019-05-18 DIAGNOSIS — R61 Generalized hyperhidrosis: Secondary | ICD-10-CM | POA: Insufficient documentation

## 2019-05-18 DIAGNOSIS — G319 Degenerative disease of nervous system, unspecified: Secondary | ICD-10-CM | POA: Insufficient documentation

## 2019-05-18 NOTE — Assessment & Plan Note (Addendum)
Intermittent night sweats over the last 3 months. Some have been drenching. Discussed this could be a medication side effect and we will change her Cymbalta to an alternative medication. We will check with her oncologist regarding the letrozole and let them know about her night sweats given her history of breast cancer. Plan for basic labs in 1 month at follow-up.

## 2019-05-18 NOTE — Assessment & Plan Note (Signed)
Mild brain atrophy noted on CT scan. Discussed that some mild changes are relatively common as people age. Discussed considering further work-up for memory difficulty relating to this if this does not improve with improvement in anxiety and depression.

## 2019-05-18 NOTE — Assessment & Plan Note (Signed)
Chronic issues. Suspect osteoarthritis. Discussed that other than pain control there is not a whole lot to do for osteoarthritic pain. Discussed that her letrozole could be playing a role as well. We will check with her oncologist regarding that.

## 2019-05-18 NOTE — Assessment & Plan Note (Signed)
Patient is having fairly consistent issues with this recently. I suspect this is contributing to her memory difficulty. We will transition her from Cymbalta to Zoloft. I will check with her clinical pharmacist on the best way to do this. Follow-up in 1 month.

## 2019-05-18 NOTE — Assessment & Plan Note (Signed)
Uncontrolled. Increase hydrochlorothiazide to 25 mg daily. She will return in 1 month for follow-up with me in labs.

## 2019-05-19 MED ORDER — SERTRALINE HCL 50 MG PO TABS: ORAL_TABLET | ORAL | 3 refills | Status: DC

## 2019-05-19 NOTE — Telephone Encounter (Signed)
Pt called and said she feels really down and she needs something. She needs to know if she should go back on Cymbalta or start the Zoloft? She said she went to get the Zoloft but the pharmacy didn't have a prescription. Please advise.

## 2019-05-30 ENCOUNTER — Other Ambulatory Visit: Payer: Self-pay | Admitting: Family Medicine

## 2019-06-01 NOTE — Telephone Encounter (Signed)
Please find out what she is continuing to require this for.  Thanks.

## 2019-06-06 ENCOUNTER — Encounter: Payer: Self-pay | Admitting: Family Medicine

## 2019-06-06 ENCOUNTER — Telehealth: Payer: Self-pay | Admitting: Family Medicine

## 2019-06-06 ENCOUNTER — Other Ambulatory Visit: Payer: Self-pay

## 2019-06-06 NOTE — Telephone Encounter (Signed)
LMTCB

## 2019-06-06 NOTE — Telephone Encounter (Signed)
Pt would like a refill on traMADol (ULTRAM) 50 MG tablet -[facility] on [address]

## 2019-06-07 ENCOUNTER — Other Ambulatory Visit: Payer: Self-pay

## 2019-06-07 MED ORDER — TRAMADOL HCL 50 MG PO TABS: 50.0000 mg | ORAL_TABLET | Freq: Two times a day (BID) | ORAL | 0 refills | Status: DC | PRN

## 2019-06-07 NOTE — Telephone Encounter (Signed)
Pt called to follow up on her Rx refill for traMADol (ULTRAM) 50 MG tablet  Please advise and Thank you!

## 2019-06-12 ENCOUNTER — Other Ambulatory Visit: Payer: Self-pay | Admitting: Oncology

## 2019-06-15 ENCOUNTER — Other Ambulatory Visit: Payer: Self-pay

## 2019-06-15 ENCOUNTER — Encounter: Payer: Self-pay | Admitting: Oncology

## 2019-06-15 NOTE — Progress Notes (Unsigned)
Patient stated that she had been doing well with no complaints. Patient's last mammogram was on 12/08/2018 and her Bone Density was done on 01/26/2018.

## 2019-06-16 ENCOUNTER — Inpatient Hospital Stay: Payer: PPO | Admitting: Oncology

## 2019-06-21 ENCOUNTER — Ambulatory Visit: Payer: PPO | Admitting: Family Medicine

## 2019-06-30 ENCOUNTER — Other Ambulatory Visit: Payer: Self-pay | Admitting: Family Medicine

## 2019-07-05 ENCOUNTER — Ambulatory Visit (INDEPENDENT_AMBULATORY_CARE_PROVIDER_SITE_OTHER): Payer: PPO | Admitting: Family Medicine

## 2019-07-05 ENCOUNTER — Other Ambulatory Visit: Payer: Self-pay

## 2019-07-05 ENCOUNTER — Encounter: Payer: Self-pay | Admitting: Family Medicine

## 2019-07-05 VITALS — Ht 64.0 in | Wt 200.0 lb

## 2019-07-05 DIAGNOSIS — J4530 Mild persistent asthma, uncomplicated: Secondary | ICD-10-CM | POA: Diagnosis not present

## 2019-07-05 DIAGNOSIS — I10 Essential (primary) hypertension: Secondary | ICD-10-CM | POA: Diagnosis not present

## 2019-07-05 DIAGNOSIS — F419 Anxiety disorder, unspecified: Secondary | ICD-10-CM

## 2019-07-05 DIAGNOSIS — R61 Generalized hyperhidrosis: Secondary | ICD-10-CM

## 2019-07-05 DIAGNOSIS — F329 Major depressive disorder, single episode, unspecified: Secondary | ICD-10-CM

## 2019-07-05 DIAGNOSIS — J309 Allergic rhinitis, unspecified: Secondary | ICD-10-CM | POA: Insufficient documentation

## 2019-07-05 DIAGNOSIS — F32A Depression, unspecified: Secondary | ICD-10-CM

## 2019-07-05 MED ORDER — SERTRALINE HCL 100 MG PO TABS: 100.0000 mg | ORAL_TABLET | Freq: Every day | ORAL | 1 refills | Status: DC

## 2019-07-05 MED ORDER — BUDESONIDE-FORMOTEROL FUMARATE 80-4.5 MCG/ACT IN AERO: 2.0000 | INHALATION_SPRAY | Freq: Two times a day (BID) | RESPIRATORY_TRACT | 3 refills | Status: DC

## 2019-07-05 NOTE — Assessment & Plan Note (Signed)
Reports history of this.  Seems to be having increased frequency of symptoms.  Will start on Symbicort.  Follow-up in about 2 months.

## 2019-07-05 NOTE — Assessment & Plan Note (Signed)
Improved control.  She will continue HCTZ.  We will have her in for a BMP.

## 2019-07-05 NOTE — Assessment & Plan Note (Signed)
Continues to have symptoms.  We will increase her Zoloft.  She will monitor and let us know if her symptoms are not improving in the next 4 to 6 weeks.  We will follow-up in 2 months.

## 2019-07-05 NOTE — Progress Notes (Signed)
Virtual Visit via telephone Note  This visit type was conducted due to national recommendations for restrictions regarding the COVID-19 pandemic (e.g. social distancing).  This format is felt to be most appropriate for this patient at this time.  All issues noted in this document were discussed and addressed.  No physical exam was performed (except for noted visual exam findings with Video Visits).   I connected with Dana Obrien today at  1:15 PM EST by telephone and verified that I am speaking with the correct person using two identifiers. Location patient: home Location provider: work  Persons participating in the virtual visit: patient, provider  I discussed the limitations, risks, security and privacy concerns of performing an evaluation and management service by telephone and the availability of in person appointments. I also discussed with the patient that there may be a patient responsible charge related to this service. The patient expressed understanding and agreed to proceed.  Interactive audio and video telecommunications were attempted between this provider and patient, however failed, due to patient having technical difficulties OR patient did not have access to video capability.  We continued and completed visit with audio only.   Reason for visit: follow-up  HPI: Depression/anxiety: Patient notes she is having issues with both.  She does not feel like the Zoloft has done anything.  No SI.  She does still feel sad most days and feels nervous though she will have a couple hours where she feels okay.  Hypertension: Typically around 130/80.  She did have 1 day where it was 108/86.  She is taking HCTZ.  No chest pain.  Asthma: Patient notes a diagnosis of this in the past.  She has started to use her albuterol inhaler every other day typically for cough and some mild dyspnea that occur at night.  The inhaler does help.  She has rare wheezing.  Night sweats: These resolved  with stopping the Cymbalta.   ROS: See pertinent positives and negatives per HPI.  Past Medical History:  Diagnosis Date  . Anxiety   . Arthritis   . Asthma   . Breast cancer ([place]) 2004   right breast  . Breast cancer ([place]) 09/08/2017   left breast  . Breast cancer of upper-outer quadrant of left female breast ([place]) 09/17/2017   T1c, N0, ER 90%, PR 90%, 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  . Cancer [facility]) 2004   right lumpectomy, chemotherapy and radiation Dr. Jeb Levering  . Depression   . GERD (gastroesophageal reflux disease)   . Hypertension   . Migraine   . Personal history of chemotherapy 2004  . Personal history of radiation therapy 2004  . Personal history of radiation therapy 2019  . PONV (postoperative nausea and vomiting)   . Shingles   . Sleep apnea    uses CPAP, severe OSA  . Vitamin D deficiency    IN THE PAST    Past Surgical History:  Procedure Laterality Date  . ABDOMINAL HYSTERECTOMY    . ANTERIOR AND POSTERIOR REPAIR    . BREAST BIOPSY Right    stereo prior to lumpectomy  . BREAST BIOPSY Left 09/07/2017   US guided biopsy/positive- invasaive mammary carcinoma  . 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  3.5 cm maximum diameter, minimal margins 5 mm.  In situ component less than 10%.  Marland Kitchen BREAST LUMPECTOMY Right 2004  . BREAST LUMPECTOMY Left 09/17/2017   Procedure: BREAST EXCISION, SENTINEL NODE BIOPSY;  Surgeon: Bary Castilla,  Forest Gleason, MD;  Location: [facility] ORS;  Service: General;  Laterality: Left;  . COLONOSCOPY  2015  . ETHMOIDECTOMY Bilateral 11/05/2016   Procedure: ETHMOIDECTOMY;  Surgeon: Melida Quitter, MD;  Location: [place];  Service: ENT;  Laterality: Bilateral;  . MAXILLARY ANTROSTOMY Bilateral 11/05/2016   Procedure: MAXILLARY ANTROSTOMY;  Surgeon: Melida Quitter, MD;  Location: [place];  Service: ENT;  Laterality: Bilateral;  . SINUS ENDO W/FUSION Bilateral 11/05/2016   Procedure: FRONTAL RECESS  EXPLORATION;  Surgeon: Melida Quitter, MD;  Location: [place];  Service: ENT;  Laterality: Bilateral;  BILATERAL ENDOSCOPIC SINUS SURGERY WITH FUSION  . SINUS EXPLORATION  11/05/2016  . SPHENOIDECTOMY Bilateral 11/05/2016   Procedure: SPHENOIDECTOMY;  Surgeon: Melida Quitter, MD;  Location: [facility] OR;  Service: ENT;  Laterality: Bilateral;    Family History  Problem Relation Age of Onset  . Heart disease Mother   . Hyperlipidemia Mother   . Hypertension Mother   . Hypertension Father   . Osteoarthritis Father   . Stroke Father   . Alzheimer's disease Sister   . Alzheimer's disease Brother   . Breast cancer Neg Hx     SOCIAL HX: Non-smoker   Current Outpatient Medications:  .  albuterol (VENTOLIN HFA) 108 (90 Base) MCG/ACT inhaler, INHALE 2 PUFFS BY MOUTH EVERY 6 HOURS AS NEEDED, Disp: 9 g, Rfl: 0 .  celecoxib (CELEBREX) 100 MG capsule, Take 100 mg by mouth 2 (two) times daily., Disp: , Rfl:  .  EQ ALLERGY RELIEF, CETIRIZINE, 10 MG tablet, Take 1 tablet by mouth once daily, Disp: 90 tablet, Rfl: 0 .  fluticasone (FLONASE) 50 MCG/ACT nasal spray, Place 1 spray into both nostrils daily., Disp: 16 g, Rfl: 5 .  hydrochlorothiazide (HYDRODIURIL) 25 MG tablet, Take 1 tablet (25 mg total) by mouth daily., Disp: 90 tablet, Rfl: 1 .  hydrOXYzine (ATARAX/VISTARIL) 10 MG tablet, Take 1 tablet (10 mg total) by mouth 3 (three) times daily as needed for itching., Disp: 90 tablet, Rfl: 1 .  letrozole (FEMARA) 2.5 MG tablet, Take 1 tablet by mouth once daily, Disp: 90 tablet, Rfl: 0 .  Multiple Vitamin (MULTI-VITAMIN) tablet, Take by mouth., Disp: , Rfl:  .  ondansetron (ZOFRAN ODT) 4 MG disintegrating tablet, Allow 1-2 tablets to dissolve in your mouth every 8 hours as needed for nausea/vomiting, Disp: 30 tablet, Rfl: 0 .  pantoprazole (PROTONIX) 40 MG tablet, Take 1 tablet (40 mg total) by mouth daily., Disp: 90 tablet, Rfl: 2 .  rosuvastatin (CRESTOR) 20 MG tablet, TAKE 1 TABLET BY MOUTH ONCE DAILY, Disp:  90 tablet, Rfl: 3 .  senna (SENOKOT) 8.6 MG TABS tablet, Take 1 tablet by mouth at bedtime. , Disp: , Rfl:  .  sertraline (ZOLOFT) 100 MG tablet, Take 1 tablet (100 mg total) by mouth daily., Disp: 90 tablet, Rfl: 1 .  SUMAtriptan (IMITREX) 20 MG/ACT nasal spray, Place 1 spray (20 mg total) into the nose as needed for migraine or headache. May repeat in 2 hours if headache persists or recurs., Disp: 1 Inhaler, Rfl: 2 .  traMADol (ULTRAM) 50 MG tablet, Take 1 tablet (50 mg total) by mouth every 12 (twelve) hours as needed., Disp: 30 tablet, Rfl: 0 .  tretinoin (RETIN-A) 0.05 % cream, Apply 1 application topically at bedtime., Disp: 45 g, Rfl: 0 .  budesonide-formoterol (SYMBICORT) 80-4.5 MCG/ACT inhaler, Inhale 2 puffs into the lungs 2 (two) times daily., Disp: 1 Inhaler, Rfl: 3  EXAM: This is a telehealth telephone visit and thus no  physical exam was completed.   ASSESSMENT AND PLAN:  Discussed the following assessment and plan:  Night sweats Resolved. Related to cymbalta.   Hypertension Improved control.  She will continue HCTZ.  We will have her in for a BMP.  Asthma Reports history of this.  Seems to be having increased frequency of symptoms.  Will start on Symbicort.  Follow-up in about 2 months.  Anxiety and depression Continues to have symptoms.  We will increase her Zoloft.  She will monitor and let us know if her symptoms are not improving in the next 4 to 6 weeks.  We will follow-up in 2 months.   Orders Placed This Encounter  Procedures  . Basic Metabolic Panel (BMET)    Standing Status:   Future    Standing Expiration Date:   07/04/2020    Meds ordered this encounter  Medications  . budesonide-formoterol (SYMBICORT) 80-4.5 MCG/ACT inhaler    Sig: Inhale 2 puffs into the lungs 2 (two) times daily.    Dispense:  1 Inhaler    Refill:  3  . sertraline (ZOLOFT) 100 MG tablet    Sig: Take 1 tablet (100 mg total) by mouth daily.    Dispense:  90 tablet    Refill:  1      I discussed the assessment and treatment plan with the patient. The patient was provided an opportunity to ask questions and all were answered. The patient agreed with the plan and demonstrated an understanding of the instructions.   The patient was advised to call back or seek an in-person evaluation if the symptoms worsen or if the condition fails to improve as anticipated.  I provided 15 minutes of non-face-to-face time during this encounter.   [name], MD

## 2019-07-05 NOTE — Assessment & Plan Note (Signed)
Resolved. Related to cymbalta.

## 2019-07-07 ENCOUNTER — Telehealth: Payer: Self-pay | Admitting: Family Medicine

## 2019-07-07 NOTE — Telephone Encounter (Signed)
LVM to set up 1 wk labs and 69m f/u with Sr Caryl Bis

## 2019-07-14 ENCOUNTER — Inpatient Hospital Stay: Payer: PPO | Admitting: Oncology

## 2019-07-20 ENCOUNTER — Encounter: Payer: Self-pay | Admitting: Oncology

## 2019-07-28 ENCOUNTER — Encounter: Payer: Self-pay | Admitting: Oncology

## 2019-07-28 ENCOUNTER — Other Ambulatory Visit: Payer: Self-pay | Admitting: Family Medicine

## 2019-07-28 ENCOUNTER — Other Ambulatory Visit: Payer: Self-pay

## 2019-07-28 MED ORDER — TRAMADOL HCL 50 MG PO TABS: 50.0000 mg | ORAL_TABLET | Freq: Two times a day (BID) | ORAL | 0 refills | Status: DC | PRN

## 2019-07-28 NOTE — Telephone Encounter (Signed)
Refill request for tramadol, last seen 07-05-19, last filled 06-07-19.  Please advise.

## 2019-07-31 ENCOUNTER — Other Ambulatory Visit: Payer: Self-pay | Admitting: Family Medicine

## 2019-07-31 ENCOUNTER — Other Ambulatory Visit: Payer: Self-pay

## 2019-07-31 ENCOUNTER — Inpatient Hospital Stay: Payer: PPO | Attending: Oncology | Admitting: Oncology

## 2019-07-31 ENCOUNTER — Encounter: Payer: Self-pay | Admitting: Oncology

## 2019-07-31 VITALS — BP 143/69 | HR 72 | Temp 97.0°F | Ht 64.0 in | Wt 202.0 lb

## 2019-07-31 DIAGNOSIS — Z08 Encounter for follow-up examination after completed treatment for malignant neoplasm: Secondary | ICD-10-CM

## 2019-07-31 DIAGNOSIS — Z885 Allergy status to narcotic agent status: Secondary | ICD-10-CM | POA: Diagnosis not present

## 2019-07-31 DIAGNOSIS — R202 Paresthesia of skin: Secondary | ICD-10-CM | POA: Insufficient documentation

## 2019-07-31 DIAGNOSIS — Z923 Personal history of irradiation: Secondary | ICD-10-CM | POA: Insufficient documentation

## 2019-07-31 DIAGNOSIS — Z79899 Other long term (current) drug therapy: Secondary | ICD-10-CM | POA: Insufficient documentation

## 2019-07-31 DIAGNOSIS — R61 Generalized hyperhidrosis: Secondary | ICD-10-CM | POA: Diagnosis not present

## 2019-07-31 DIAGNOSIS — Z79811 Long term (current) use of aromatase inhibitors: Secondary | ICD-10-CM | POA: Diagnosis not present

## 2019-07-31 DIAGNOSIS — M79609 Pain in unspecified limb: Secondary | ICD-10-CM | POA: Diagnosis not present

## 2019-07-31 DIAGNOSIS — Z8349 Family history of other endocrine, nutritional and metabolic diseases: Secondary | ICD-10-CM | POA: Diagnosis not present

## 2019-07-31 DIAGNOSIS — Z823 Family history of stroke: Secondary | ICD-10-CM | POA: Insufficient documentation

## 2019-07-31 DIAGNOSIS — G8929 Other chronic pain: Secondary | ICD-10-CM | POA: Diagnosis not present

## 2019-07-31 DIAGNOSIS — M549 Dorsalgia, unspecified: Secondary | ICD-10-CM | POA: Insufficient documentation

## 2019-07-31 DIAGNOSIS — Z9221 Personal history of antineoplastic chemotherapy: Secondary | ICD-10-CM | POA: Diagnosis not present

## 2019-07-31 DIAGNOSIS — Z8261 Family history of arthritis: Secondary | ICD-10-CM | POA: Diagnosis not present

## 2019-07-31 DIAGNOSIS — I10 Essential (primary) hypertension: Secondary | ICD-10-CM | POA: Insufficient documentation

## 2019-07-31 DIAGNOSIS — J45909 Unspecified asthma, uncomplicated: Secondary | ICD-10-CM | POA: Insufficient documentation

## 2019-07-31 DIAGNOSIS — Z8249 Family history of ischemic heart disease and other diseases of the circulatory system: Secondary | ICD-10-CM | POA: Insufficient documentation

## 2019-07-31 DIAGNOSIS — R5383 Other fatigue: Secondary | ICD-10-CM | POA: Insufficient documentation

## 2019-07-31 DIAGNOSIS — M1990 Unspecified osteoarthritis, unspecified site: Secondary | ICD-10-CM | POA: Insufficient documentation

## 2019-07-31 DIAGNOSIS — C50412 Malignant neoplasm of upper-outer quadrant of left female breast: Secondary | ICD-10-CM | POA: Insufficient documentation

## 2019-07-31 DIAGNOSIS — Z853 Personal history of malignant neoplasm of breast: Secondary | ICD-10-CM

## 2019-07-31 DIAGNOSIS — F329 Major depressive disorder, single episode, unspecified: Secondary | ICD-10-CM | POA: Diagnosis not present

## 2019-07-31 DIAGNOSIS — F419 Anxiety disorder, unspecified: Secondary | ICD-10-CM | POA: Insufficient documentation

## 2019-07-31 DIAGNOSIS — Z818 Family history of other mental and behavioral disorders: Secondary | ICD-10-CM | POA: Diagnosis not present

## 2019-07-31 MED ORDER — CETIRIZINE HCL 10 MG PO TABS: 10.0000 mg | ORAL_TABLET | Freq: Every day | ORAL | 0 refills | Status: DC

## 2019-07-31 NOTE — Progress Notes (Signed)
Patient stated that she had been doing well with no complaints. Patient's last mammogram was on 12/08/2018 and Bone density was on 01/26/2018.

## 2019-07-31 NOTE — Progress Notes (Signed)
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   Chief complaint/ Reason for visit-routine follow-up of breast cancer on letrozole  Heme/Onc history: patient is a 74 year old female with a prior history of right breast cancer in 2004s/[facility]. T2N0M0.She thenn completed 5 years of aromasin until 2009.She could not tolerate arimidex.  Recent screening mammogram on 08/25/2017 showed a possible mass in her left breast. This was followed by an ultrasound and a diagnostic left mammogram which showed a 0.7 x 0.5 x 0.7 cm mass in the left breast. No evidence of left axillary adenopathy.  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 ER greater than 90% positive, PR greater than 90% positive and HER-2/neu negative   Patient has been seen by Dr. Camelia Phenes is scheduled to undergo surgery on 09/17/2017  She is G2P2L2. Menarche at the age of 43. Menopause in her early 86's. She used to take prempro from [age] yrs but none since then. She does have chronic back pain and tingling/pain in her extremities. Cervical spine MRI showed egenrative disease  Final pathology showed:Invasive mammary carcinoma 13 mm, grade 1 with associated DCIS. Negative margins. 0 out of 3 lymph nodes were positive for malignancy. 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  She had problems tolerating Aromasin and was switched to letrozole  Interval history-patient currently reports feeling better after starting Zoloft.  She is to get night sweats when she was on Cymbalta but patient is presently not on Cymbalta and those symptoms have resolved as well.  She was switched to letrozole from Aromasin and states that she is tolerating it well.  She does have some chronic joint pains which are not particularly worse with letrozole.  She is also taking her calcium and vitamin D.  ECOG PS- 1 Pain scale- 0   Review of systems- Review of Systems  Constitutional: Positive for malaise/fatigue. Negative for chills, fever and weight loss.  HENT: Negative for congestion, ear discharge and nosebleeds.   Eyes: Negative for blurred vision.  Respiratory: Negative for cough, hemoptysis, sputum production, shortness of breath and wheezing.   Cardiovascular: Negative for chest pain, palpitations, orthopnea and claudication.  Gastrointestinal: Negative for abdominal pain, blood in stool, constipation, diarrhea, heartburn, melena, nausea and vomiting.  Genitourinary: Negative for dysuria, flank pain, frequency, hematuria and urgency.  Musculoskeletal: Positive for joint pain. Negative for back pain and myalgias.  Skin: Negative for rash.  Neurological: Negative for dizziness, tingling, focal weakness, seizures, weakness and headaches.  Endo/Heme/Allergies: Does not bruise/bleed easily.  Psychiatric/Behavioral: Negative for depression and suicidal ideas. The patient does not have insomnia.       Allergies  Allergen Reactions  . Lidocaine Palpitations  .  Codeine Nausea Only and Nausea And Vomiting  .  Morphine Nausea Only  . Niacin Rash  . Niacin And Related Rash    ANTIHYPERLIPEMICS     Past Medical History:  Diagnosis Date  . Anxiety   . Arthritis   . Asthma   . Breast cancer ([place]) 2004   right breast  . Breast cancer ([place]) 09/08/2017   left breast  . Breast cancer of upper-outer quadrant of left female breast ([place]) 09/17/2017   T1c, N0, ER 90%, PR 90%, 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  . Cancer [facility]) 2004   right lumpectomy, chemotherapy and radiation Dr. Jeb Levering  . Depression   . GERD (gastroesophageal reflux disease)   . Hypertension   . Migraine   . Personal history of chemotherapy 2004  . Personal history of radiation therapy 2004  . Personal history of radiation therapy 2019  . PONV (postoperative nausea and vomiting)   . Shingles   . Sleep apnea    uses CPAP, severe OSA  . Vitamin D deficiency    IN THE PAST     Past Surgical History:  Procedure Laterality Date  . ABDOMINAL HYSTERECTOMY    . ANTERIOR AND POSTERIOR REPAIR    . BREAST BIOPSY Right    stereo prior to lumpectomy  . BREAST BIOPSY Left 09/07/2017   US guided biopsy/positive- invasaive mammary carcinoma  . 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  3.5 cm maximum diameter, minimal margins 5 mm.  In situ component less than 10%.  Marland Kitchen BREAST LUMPECTOMY Right 2004  . BREAST LUMPECTOMY Left 09/17/2017   Procedure: BREAST EXCISION, SENTINEL NODE BIOPSY;  Surgeon: Robert Bellow, MD;  Location: [facility] ORS;  Service: General;  Laterality: Left;  . COLONOSCOPY  2015  . ETHMOIDECTOMY Bilateral 11/05/2016   Procedure: ETHMOIDECTOMY;  Surgeon: Melida Quitter, MD;  Location: [place];  Service: ENT;  Laterality: Bilateral;  . MAXILLARY ANTROSTOMY Bilateral 11/05/2016   Procedure: MAXILLARY ANTROSTOMY;  Surgeon: Melida Quitter, MD;  Location: [place];  Service: ENT;  Laterality: Bilateral;  . SINUS ENDO W/FUSION Bilateral 11/05/2016   Procedure: FRONTAL  RECESS EXPLORATION;  Surgeon: Melida Quitter, MD;  Location: [place];  Service: ENT;  Laterality: Bilateral;  BILATERAL ENDOSCOPIC SINUS SURGERY WITH FUSION  . SINUS EXPLORATION  11/05/2016  . SPHENOIDECTOMY Bilateral 11/05/2016   Procedure: SPHENOIDECTOMY;  Surgeon: Melida Quitter, MD;  Location[facility];  Service: ENT;  Laterality: Bilateral;    Social History   Socioeconomic History  . Marital status: Widowed    Spouse name: Not on file  . Number of children: Not on file  . Years of education: Not on file  . Highest education level: Not on file  Occupational History  . Not on file  Tobacco Use  . Smoking status: Never Smoker  . Smokeless tobacco: Never Used  Substance and Sexual Activity  . Alcohol use: Not Currently    Comment: rarely  . Drug use: No  . Sexual activity: Not Currently  Other Topics Concern  . Not on file  Social History Narrative   Daily Caffeine Use:  1-2 coffee   Regular Exercise -  NO   Social Determinants of Health   Financial Resource Strain: Low Risk   . Difficulty of Paying Living Expenses: Not hard at all  Food Insecurity: No Food Insecurity  . Worried About Charity fundraiser in the Last Year: Never true  . Ran Out of Food in  the Last Year: Never true  Transportation Needs: No Transportation Needs  . Lack of Transportation (Medical): No  . Lack of Transportation (Non-Medical): No  Physical Activity: Inactive  . Days of Exercise per Week: 0 days  . Minutes of Exercise per Session: 0 min  Stress: No Stress Concern Present  . Feeling of Stress : Not at all  Social Connections:   . Frequency of Communication with Friends and Family: Not on file  . Frequency of Social Gatherings with Friends and Family: Not on file  . Attends Religious Services: Not on file  . Active Member of Clubs or Organizations: Not on file  . Attends Archivist Meetings: Not on file  . Marital Status: Not on file  Intimate Partner Violence:   . Fear of Current or  Ex-Partner: Not on file  . Emotionally Abused: Not on file  . Physically Abused: Not on file  . Sexually Abused: Not on file    Family History  Problem Relation Age of Onset  . Heart disease Mother   . Hyperlipidemia Mother   . Hypertension Mother   . Hypertension Father   . Osteoarthritis Father   . Stroke Father   . Alzheimer's disease Sister   . Alzheimer's disease Brother   . Breast cancer Neg Hx      Current Outpatient Medications:  .  albuterol (VENTOLIN HFA) 108 (90 Base) MCG/ACT inhaler, INHALE 2 PUFFS BY MOUTH EVERY 6 HOURS AS NEEDED, Disp: 9 g, Rfl: 0 .  budesonide-formoterol (SYMBICORT) 80-4.5 MCG/ACT inhaler, Inhale 2 puffs into the lungs 2 (two) times daily., Disp: 1 Inhaler, Rfl: 3 .  cetirizine (EQ ALLERGY RELIEF, CETIRIZINE,) 10 MG tablet, Take 1 tablet (10 mg total) by mouth daily., Disp: 90 tablet, Rfl: 0 .  fluticasone (FLONASE) 50 MCG/ACT nasal spray, Place 1 spray into both nostrils daily., Disp: 16 g, Rfl: 5 .  hydrochlorothiazide (HYDRODIURIL) 25 MG tablet, Take 1 tablet (25 mg total) by mouth daily., Disp: 90 tablet, Rfl: 1 .  letrozole (FEMARA) 2.5 MG tablet, Take 1 tablet by mouth once daily, Disp: 90 tablet, Rfl: 0 .  Multiple Vitamin (MULTI-VITAMIN) tablet, Take by mouth., Disp: , Rfl:  .  ondansetron (ZOFRAN ODT) 4 MG disintegrating tablet, Allow 1-2 tablets to dissolve in your mouth every 8 hours as needed for nausea/vomiting, Disp: 30 tablet, Rfl: 0 .  pantoprazole (PROTONIX) 40 MG tablet, Take 1 tablet (40 mg total) by mouth daily., Disp: 90 tablet, Rfl: 2 .  rosuvastatin (CRESTOR) 20 MG tablet, Take 1 tablet by mouth once daily, Disp: 90 tablet, Rfl: 0 .  senna (SENOKOT) 8.6 MG TABS tablet, Take 1 tablet by mouth at bedtime. , Disp: , Rfl:  .  sertraline (ZOLOFT) 100 MG tablet, Take 1 tablet (100 mg total) by mouth daily., Disp: 90 tablet, Rfl: 1 .  SUMAtriptan (IMITREX) 20 MG/ACT nasal spray, Place 1 spray (20 mg total) into the nose as needed for  migraine or headache. May repeat in 2 hours if headache persists or recurs., Disp: 1 Inhaler, Rfl: 2 .  traMADol (ULTRAM) 50 MG tablet, Take 1 tablet (50 mg total) by mouth every 12 (twelve) hours as needed., Disp: 30 tablet, Rfl: 0 .  tretinoin (RETIN-A) 0.05 % cream, Apply 1 application topically at bedtime., Disp: 45 g, Rfl: 0 .  celecoxib (CELEBREX) 100 MG capsule, Take 1 capsule by mouth twice daily, Disp: 60 capsule, Rfl: 0 .  hydrOXYzine (ATARAX/VISTARIL) 10 MG tablet, Take 1 tablet (10  mg total) by mouth 3 (three) times daily as needed for itching. (Patient not taking: Reported on 07/28/2019), Disp: 90 tablet, Rfl: 1  Physical exam:  Vitals:   07/31/19 1109  BP: (!) 143/69  Pulse: 72  Temp: (!) 97 F (36.1 C)  TempSrc: Tympanic  Weight: 202 lb (91.6 kg)  Height: 5' 4"  (1.626 m)   Physical Exam HENT:     Head: Normocephalic and atraumatic.  Eyes:     Pupils: Pupils are equal, round, and reactive to light.  Cardiovascular:     Rate and Rhythm: Normal rate and regular rhythm.     Heart sounds: Normal heart sounds.  Pulmonary:     Effort: Pulmonary effort is normal.     Breath sounds: Normal breath sounds.  Musculoskeletal:     Cervical back: Normal range of motion.  Skin:    General: Skin is warm and dry.  Neurological:     Mental Status: She is alert and oriented to person, place, and time.   Breast exam performed in sitting and lying down position.  No palpable bilateral axillary adenopathy.  Patient is s/p bilateral lumpectomy with a well-healed surgical scar.  Right nipple is chronically inverted.  No palpable breast masses in either breast.   CMP Latest Ref Rng & Units 04/10/2019  Glucose 70 - 99 mg/dL 130(H)  BUN 8 - 23 mg/dL 16  Creatinine 0.44 - 1.00 mg/dL 0.59  Sodium 135 - 145 mmol/L 135  Potassium 3.5 - 5.1 mmol/L 4.1  Chloride 98 - 111 mmol/L 99  CO2 22 - 32 mmol/L 25  Calcium 8.9 - 10.3 mg/dL 9.6  Total Protein 6.5 - 8.1 g/dL 8.7(H)  Total Bilirubin 0.3  - 1.2 mg/dL 1.0  Alkaline Phos 38 - 126 U/L 77  AST 15 - 41 U/L 25  ALT 0 - 44 U/L 20   CBC Latest Ref Rng & Units 04/10/2019  WBC 4.0 - 10.5 K/uL 9.4  Hemoglobin 12.0 - 15.0 g/dL 13.7  Hematocrit 36.0 - 46.0 % 39.8  Platelets 150 - 400 K/uL 269      Assessment and plan- Patient is a 74 y.o. female withinvasive mammary carcinoma of the left breast stage IA pT1c pN0 cM0 ER PR positive and HER-2/neu negativestatus post lumpectomyand radiation treatment.   She is here for routine follow-up of breast cancer  After patient was switched to Zoloft and letrozole patient does report improvement in her symptoms of depression and anxiety.  Denies any significant hot flashes.  She gets them far and she will are often self-limited.  She has chronic joint pain but not bad enough for her to change from letrozole at this time.  She will be due for bone density scan and mammogram in July 2021.  I will see her back in 6 months no labs   Visit Diagnosis 1. Encounter for follow-up surveillance of breast cancer   2. Use of letrozole (Femara)      Dr. Randa Evens, MD, MPH [employer] at [facility] [phone] 07/31/2019 5:23 PM

## 2019-08-03 ENCOUNTER — Other Ambulatory Visit (INDEPENDENT_AMBULATORY_CARE_PROVIDER_SITE_OTHER): Payer: PPO

## 2019-08-03 ENCOUNTER — Other Ambulatory Visit: Payer: Self-pay

## 2019-08-03 DIAGNOSIS — I10 Essential (primary) hypertension: Secondary | ICD-10-CM | POA: Diagnosis not present

## 2019-08-03 LAB — BASIC METABOLIC PANEL
BUN: 14 mg/dL (ref 6–23)
CO2: 28 mEq/L (ref 19–32)
Calcium: 9.6 mg/dL (ref 8.4–10.5)
Chloride: 102 mEq/L (ref 96–112)
Creatinine, Ser: 0.75 mg/dL (ref 0.40–1.20)
GFR: 75.97 mL/min (ref >60.00)
Glucose, Bld: 96 mg/dL (ref 70–99)
Potassium: 3.8 mEq/L (ref 3.5–5.1)
Sodium: 137 mEq/L (ref 135–145)

## 2019-08-08 DIAGNOSIS — L570 Actinic keratosis: Secondary | ICD-10-CM | POA: Diagnosis not present

## 2019-08-08 DIAGNOSIS — B351 Tinea unguium: Secondary | ICD-10-CM | POA: Diagnosis not present

## 2019-08-10 ENCOUNTER — Other Ambulatory Visit: Payer: Self-pay

## 2019-08-11 ENCOUNTER — Ambulatory Visit: Payer: PPO

## 2019-08-11 ENCOUNTER — Ambulatory Visit: Payer: PPO | Admitting: Family Medicine

## 2019-08-14 ENCOUNTER — Ambulatory Visit (INDEPENDENT_AMBULATORY_CARE_PROVIDER_SITE_OTHER): Payer: PPO | Admitting: Family Medicine

## 2019-08-14 ENCOUNTER — Encounter: Payer: Self-pay | Admitting: Family Medicine

## 2019-08-14 ENCOUNTER — Other Ambulatory Visit: Payer: Self-pay

## 2019-08-14 VITALS — Ht 64.0 in | Wt 200.0 lb

## 2019-08-14 DIAGNOSIS — F32A Depression, unspecified: Secondary | ICD-10-CM

## 2019-08-14 DIAGNOSIS — M2550 Pain in unspecified joint: Secondary | ICD-10-CM | POA: Diagnosis not present

## 2019-08-14 DIAGNOSIS — F419 Anxiety disorder, unspecified: Secondary | ICD-10-CM

## 2019-08-14 DIAGNOSIS — J309 Allergic rhinitis, unspecified: Secondary | ICD-10-CM

## 2019-08-14 DIAGNOSIS — E785 Hyperlipidemia, unspecified: Secondary | ICD-10-CM | POA: Diagnosis not present

## 2019-08-14 DIAGNOSIS — F329 Major depressive disorder, single episode, unspecified: Secondary | ICD-10-CM

## 2019-08-14 NOTE — Progress Notes (Signed)
Virtual Visit via telephone Note  This visit type was conducted due to national recommendations for restrictions regarding the COVID-19 pandemic (e.g. social distancing).  This format is felt to be most appropriate for this patient at this time.  All issues noted in this document were discussed and addressed.  No physical exam was performed (except for noted visual exam findings with Video Visits).   I connected with Dana Obrien today at 11:30 AM EDT by telephone and verified that I am speaking with the correct person using two identifiers. Location patient: home Location provider: home office Persons participating in the virtual visit: patient, provider  I discussed the limitations, risks, security and privacy concerns of performing an evaluation and management service by telephone and the availability of in person appointments. I also discussed with the patient that there may be a patient responsible charge related to this service. The patient expressed understanding and agreed to proceed.  Interactive audio and video telecommunications were attempted between this provider and patient, however failed, due to patient having technical difficulties OR patient did not have access to video capability.  We continued and completed visit with audio only.   Reason for visit: follow-up  HPI: Depression: Doing well.  She notes it is quite a bit improved.  Continues on Zoloft.  No SI.  She does note some difficulty sleeping over the last few weeks since going up on the Zoloft dose.  She wears her CPAP and reads before bed and turns her light off when she gets sleepy though she cannot fall asleep for 2 to 3 hours.  She is taking her Zoloft at night.  HYPERLIPIDEMIA Symptoms Chest pain on exertion:  no   Medications: Compliance-taking Crestor. Right upper quadrant pain- no  Muscle aches- no  Allergic rhinitis: Patient notes she uses saline spray and that helps with her nasal congestion.  She did  not start on the Symbicort for a presumed history of asthma as she has not had any shortness of breath or wheezing issues.  No cough.  Arthralgia: Previously had labs to evaluate for underlying rheumatologic disease though those were negative.  She continues to have chronic joint aches.  She takes tramadol twice daily for this.  She wants to see a rheumatologist.   ROS: See pertinent positives and negatives per HPI.  Past Medical History:  Diagnosis Date  . Anxiety   . Arthritis   . Asthma   . Breast cancer ([place]) 2004   right breast  . Breast cancer ([place]) 09/08/2017   left breast  . Breast cancer of upper-outer quadrant of left female breast ([place]) 09/17/2017   T1c, N0, ER 90%, PR 90%, 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  . Cancer [facility]) 2004   right lumpectomy, chemotherapy and radiation Dr. Jeb Levering  . Depression   . GERD (gastroesophageal reflux disease)   . Hypertension   . Migraine   . Personal history of chemotherapy 2004  . Personal history of radiation therapy 2004  . Personal history of radiation therapy 2019  . PONV (postoperative nausea and vomiting)   . Shingles   . Sleep apnea    uses CPAP, severe OSA  . Vitamin D deficiency    IN THE PAST    Past Surgical History:  Procedure Laterality Date  . ABDOMINAL HYSTERECTOMY    . ANTERIOR AND POSTERIOR REPAIR    . BREAST BIOPSY Right    stereo prior to lumpectomy  . BREAST BIOPSY Left 09/07/2017   US guided biopsy/positive- invasaive mammary  carcinoma  . 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  3.5 cm maximum diameter, minimal margins 5 mm.  In situ component less than 10%.  Marland Kitchen BREAST LUMPECTOMY Right 2004  . BREAST LUMPECTOMY Left 09/17/2017   Procedure: BREAST EXCISION, SENTINEL NODE BIOPSY;  Surgeon: Robert Bellow, MD;  Location: [facility] ORS;  Service: General;  Laterality: Left;  . COLONOSCOPY  2015  . ETHMOIDECTOMY Bilateral 11/05/2016   Procedure:  ETHMOIDECTOMY;  Surgeon: Melida Quitter, MD;  Location: [place];  Service: ENT;  Laterality: Bilateral;  . MAXILLARY ANTROSTOMY Bilateral 11/05/2016   Procedure: MAXILLARY ANTROSTOMY;  Surgeon: Melida Quitter, MD;  Location: [place];  Service: ENT;  Laterality: Bilateral;  . SINUS ENDO W/FUSION Bilateral 11/05/2016   Procedure: FRONTAL RECESS EXPLORATION;  Surgeon: Melida Quitter, MD;  Location: [place];  Service: ENT;  Laterality: Bilateral;  BILATERAL ENDOSCOPIC SINUS SURGERY WITH FUSION  . SINUS EXPLORATION  11/05/2016  . SPHENOIDECTOMY Bilateral 11/05/2016   Procedure: SPHENOIDECTOMY;  Surgeon: Melida Quitter, MD;  [facility];  Service: ENT;  Laterality: Bilateral;    Family History  Problem Relation Age of Onset  . Heart disease Mother   . Hyperlipidemia Mother   . Hypertension Mother   . Hypertension Father   . Osteoarthritis Father   . Stroke Father   . Alzheimer's disease Sister   . Alzheimer's disease Brother   . Breast cancer Neg Hx     SOCIAL HX: Non-smoker   Current Outpatient Medications:  .  albuterol (VENTOLIN HFA) 108 (90 Base) MCG/ACT inhaler, INHALE 2 PUFFS BY MOUTH EVERY 6 HOURS AS NEEDED, Disp: 9 g, Rfl: 0 .  celecoxib (CELEBREX) 100 MG capsule, Take 1 capsule by mouth twice daily, Disp: 60 capsule, Rfl: 0 .  cetirizine (EQ ALLERGY RELIEF, CETIRIZINE,) 10 MG tablet, Take 1 tablet (10 mg total) by mouth daily., Disp: 90 tablet, Rfl: 0 .  fluticasone (FLONASE) 50 MCG/ACT nasal spray, Place 1 spray into both nostrils daily., Disp: 16 g, Rfl: 5 .  hydrochlorothiazide (HYDRODIURIL) 25 MG tablet, Take 1 tablet (25 mg total) by mouth daily., Disp: 90 tablet, Rfl: 1 .  hydrOXYzine (ATARAX/VISTARIL) 10 MG tablet, Take 1 tablet (10 mg total) by mouth 3 (three) times daily as needed for itching., Disp: 90 tablet, Rfl: 1 .  letrozole (FEMARA) 2.5 MG tablet, Take 1 tablet by mouth once daily, Disp: 90 tablet, Rfl: 0 .  Multiple Vitamin (MULTI-VITAMIN) tablet, Take by mouth., Disp: ,  Rfl:  .  ondansetron (ZOFRAN ODT) 4 MG disintegrating tablet, Allow 1-2 tablets to dissolve in your mouth every 8 hours as needed for nausea/vomiting, Disp: 30 tablet, Rfl: 0 .  pantoprazole (PROTONIX) 40 MG tablet, Take 1 tablet (40 mg total) by mouth daily., Disp: 90 tablet, Rfl: 2 .  rosuvastatin (CRESTOR) 20 MG tablet, Take 1 tablet by mouth once daily, Disp: 90 tablet, Rfl: 0 .  senna (SENOKOT) 8.6 MG TABS tablet, Take 1 tablet by mouth at bedtime. , Disp: , Rfl:  .  sertraline (ZOLOFT) 100 MG tablet, Take 1 tablet (100 mg total) by mouth daily., Disp: 90 tablet, Rfl: 1 .  SUMAtriptan (IMITREX) 20 MG/ACT nasal spray, Place 1 spray (20 mg total) into the nose as needed for migraine or headache. May repeat in 2 hours if headache persists or recurs., Disp: 1 Inhaler, Rfl: 2 .  traMADol (ULTRAM) 50 MG tablet, Take 1 tablet (50 mg total) by mouth every 12 (twelve)  hours as needed., Disp: 30 tablet, Rfl: 0 .  tretinoin (RETIN-A) 0.05 % cream, Apply 1 application topically at bedtime., Disp: 45 g, Rfl: 0  EXAM: This was a telehealth telephone visit and thus no physical exam was completed.   ASSESSMENT AND PLAN:  Discussed the following assessment and plan:  Arthralgia Refer to rheumatology at patient request.  Anxiety and depression Depression has improved.  Discussed continuing to monitor on her current dose and if it does not improve completely or remain adequately controlled she can let us know and we can increase the dose again.  Follow-up in 4 months.  I suspect her sleep issues are related to the Zoloft.  She will try taking it in the morning and see if that is beneficial.  If not helpful within 1 to 2 weeks she will let us know.  Hyperlipidemia Continue Crestor.  Check labs at next visit.  Allergic rhinitis More likely an allergic rhinitis issue.  She did not start on the inhaler.  We will discontinue this from her medication list.  She will continue to monitor.   Orders Placed  This Encounter  Procedures  . Ambulatory referral to Rheumatology    Referral Priority:   Routine    Referral Type:   Consultation    Referral Reason:   Specialty Services Required    Requested Specialty:   Rheumatology    Number of Visits Requested:   1    No orders of the defined types were placed in this encounter.    I discussed the assessment and treatment plan with the patient. The patient was provided an opportunity to ask questions and all were answered. The patient agreed with the plan and demonstrated an understanding of the instructions.   The patient was advised to call back or seek an in-person evaluation if the symptoms worsen or if the condition fails to improve as anticipated.  I provided 12 minutes of non-face-to-face time during this encounter.   [name], MD

## 2019-08-14 NOTE — Assessment & Plan Note (Signed)
More likely an allergic rhinitis issue.  She did not start on the inhaler.  We will discontinue this from her medication list.  She will continue to monitor.

## 2019-08-14 NOTE — Assessment & Plan Note (Addendum)
Refer to rheumatology at patient request.

## 2019-08-14 NOTE — Assessment & Plan Note (Addendum)
Depression has improved.  Discussed continuing to monitor on her current dose and if it does not improve completely or remain adequately controlled she can let us know and we can increase the dose again.  Follow-up in 4 months.  I suspect her sleep issues are related to the Zoloft.  She will try taking it in the morning and see if that is beneficial.  If not helpful within 1 to 2 weeks she will let us know.

## 2019-08-14 NOTE — Assessment & Plan Note (Signed)
Continue Crestor.  Check labs at next visit.

## 2019-08-16 ENCOUNTER — Ambulatory Visit (INDEPENDENT_AMBULATORY_CARE_PROVIDER_SITE_OTHER): Payer: PPO

## 2019-08-16 ENCOUNTER — Other Ambulatory Visit: Payer: Self-pay

## 2019-08-16 VITALS — Ht 64.0 in | Wt 200.0 lb

## 2019-08-16 DIAGNOSIS — Z0000 Encounter for general adult medical examination without abnormal findings: Secondary | ICD-10-CM | POA: Diagnosis not present

## 2019-08-16 NOTE — Progress Notes (Signed)
I have reviewed the above note and agree.  Scotland Korver, M.D.  

## 2019-08-16 NOTE — Patient Instructions (Addendum)
  Dana Obrien , Thank you for taking time to come for your Medicare Wellness Visit. I appreciate your ongoing commitment to your health goals. Please review the following plan we discussed and let me know if I can assist you in the future.   These are the goals we discussed: Goals      Patient Stated   . Increase physical activity (pt-stated)     Walk for exercise as tolerated.       This is a list of the screening recommended for you and due dates:  Health Maintenance  Topic Date Due  . Tetanus Vaccine  06/02/2015  . Flu Shot  08/30/2019*  . Mammogram  12/07/2020  . Colon Cancer Screening  06/28/2023  . DEXA scan (bone density measurement)  Completed  .  Hepatitis C: One time screening is recommended by Center for Disease Control  (CDC) for  adults born from [birth year] through 1963.   Completed  *Topic was postponed. The date shown is not the original due date.

## 2019-08-16 NOTE — Progress Notes (Signed)
Subjective:   Dana Obrien is a 74 y.o. female who presents for Medicare Annual (Subsequent) preventive examination.  Review of Systems:  No ROS.  Medicare Wellness Virtual Visit.  Visual/audio telehealth visit, UTA vital signs.   Ht/Wt provided. See social history for additional risk factors.   Cardiac Risk Factors include: advanced age (>71mn, >>80women);hypertension     Objective:     Vitals: Ht 5' 4"  (1.626 m)   Wt 200 lb (90.7 kg)   BMI 34.33 kg/m   Body mass index is 34.33 kg/m.  Advanced Directives 08/16/2019 07/31/2019 07/28/2019 06/15/2019 04/22/2019 12/13/2018 08/09/2018  Does Patient Have a Medical Advance Directive? No No No No No No No  Would patient like information on creating a medical advance directive? No - Patient declined No - Patient declined No - Patient declined No - Patient declined - Yes (MAU/Ambulatory/Procedural Areas - Information given) No - Patient declined    Tobacco Social History   Tobacco Use  Smoking Status Never Smoker  Smokeless Tobacco Never Used     Counseling given: Not Answered   Clinical Intake:  Pre-visit preparation completed: Yes        Diabetes: No  How often do you need to have someone help you when you read instructions, pamphlets, or other written materials from your doctor or pharmacy?: 1 - Never  Interpreter Needed?: No     Past Medical History:  Diagnosis Date  . Anxiety   . Arthritis   . Asthma   . [place]   [place]  . [place]   [place] breast  . [place]   T1c, N0, ER 90%, PR 90%, 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  . Cancer [facility]   right lumpectomy, chemotherapy and radiation Dr. CJeb Levering . Depression   . GERD (gastroesophageal reflux disease)   . Hypertension   . Migraine   . Personal history of chemotherapy 2004  . Personal history of radiation therapy 2004  . Personal history of  radiation therapy 2019  . PONV (postoperative nausea and vomiting)   . Shingles   . Sleep apnea    uses CPAP, severe OSA  . Vitamin D deficiency    IN THE PAST   Past Surgical History:  Procedure Laterality Date  . ABDOMINAL HYSTERECTOMY    . ANTERIOR AND POSTERIOR REPAIR    . BREAST BIOPSY Right    stereo prior to lumpectomy  . BREAST BIOPSY Left 09/07/2017   [place]/positive- invasaive mammary carcinoma  . 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  3.5 cm maximum diameter, minimal margins 5 mm.  In situ component less than 10%.  .Marland KitchenBREAST LUMPECTOMY Right 2004  . BREAST LUMPECTOMY Left 09/17/2017   Procedure: BREAST EXCISION, SENTINEL NODE BIOPSY;  Surgeon: BRobert Bellow MD;  Location: [facility] OR[facility];  Service: General;  Laterality: Left;  . COLONOSCOPY  2015  . ETHMOIDECTOMY Bilateral 11/05/2016   Procedure: ETHMOIDECTOMY;  Surgeon: BMelida Quitter MD;  [place]  Service: ENT;  Laterality: Bilateral;  . MAXILLARY ANTROSTOMY Bilateral 11/05/2016   Procedure: MAXILLARY ANTROSTOMY;  Surgeon: BMelida Quitter MD;  [place]  Service: ENT;  Laterality: Bilateral;  . SINUS ENDO W/FUSION Bilateral 11/05/2016   Procedure: FRONTAL RECESS EXPLORATION;  Surgeon: BMelida Quitter MD;  [place]  Service: ENT;  Laterality: Bilateral;  BILATERAL ENDOSCOPIC SINUS SURGERY  WITH FUSION  . SINUS EXPLORATION  11/05/2016  . SPHENOIDECTOMY Bilateral 11/05/2016   Procedure: SPHENOIDECTOMY;  Surgeon: Melida Quitter, MD;  Location: [facility];  Service: ENT;  Laterality: Bilateral;   Family History  Problem Relation Age of Onset  . Heart disease Mother   . Hyperlipidemia Mother   . Hypertension Mother   . Hypertension Father   . Osteoarthritis Father   . Stroke Father   . Alzheimer's disease Sister   . Alzheimer's disease Brother   . Brain cancer Brother   . Breast cancer Neg Hx    Social History   Socioeconomic  History  . Marital status: Widowed    Spouse name: Not on file  . Number of children: Not on file  . Years of education: Not on file  . Highest education level: Not on file  Occupational History  . Not on file  Tobacco Use  . Smoking status: Never Smoker  . Smokeless tobacco: Never Used  Substance and Sexual Activity  . Alcohol use: Not Currently    Comment: rarely  . Drug use: No  . Sexual activity: Not Currently  Other Topics Concern  . Not on file  Social History Narrative   Daily Caffeine Use:  1-2 coffee   Regular Exercise -  NO   Social Determinants of Health   Financial Resource Strain:   . Difficulty of Paying Living Expenses:   Food Insecurity:   . Worried About Charity fundraiser in the Last Year:   . Arboriculturist in the Last Year:   Transportation Needs: No Transportation Needs  . Lack of Transportation (Medical): No  . Lack of Transportation (Non-Medical): No  Physical Activity: Unknown  . Days of Exercise per Week: 0 days  . Minutes of Exercise per Session: Not on file  Stress:   . Feeling of Stress :   Social Connections: Unknown  . Frequency of Communication with Friends and Family: More than three times a week  . Frequency of Social Gatherings with Friends and Family: More than three times a week  . Attends Religious Services: Not on file  . Active Member of Clubs or Organizations: Not on file  . Attends Archivist Meetings: Not on file  . Marital Status: Widowed    Outpatient Encounter Medications as of 08/16/2019  Medication Sig  . albuterol (VENTOLIN HFA) 108 (90 Base) MCG/ACT inhaler INHALE 2 PUFFS BY MOUTH EVERY 6 HOURS AS NEEDED  . celecoxib (CELEBREX) 100 MG capsule Take 1 capsule by mouth twice daily  . cetirizine (EQ ALLERGY RELIEF, CETIRIZINE,) 10 MG tablet Take 1 tablet (10 mg total) by mouth daily.  . fluticasone (FLONASE) 50 MCG/ACT nasal spray Place 1 spray into both nostrils daily.  . hydrochlorothiazide (HYDRODIURIL) 25  MG tablet Take 1 tablet (25 mg total) by mouth daily.  . hydrOXYzine (ATARAX/VISTARIL) 10 MG tablet Take 1 tablet (10 mg total) by mouth 3 (three) times daily as needed for itching.  . letrozole (FEMARA) 2.5 MG tablet Take 1 tablet by mouth once daily  . Multiple Vitamin (MULTI-VITAMIN) tablet Take by mouth.  . ondansetron (ZOFRAN ODT) 4 MG disintegrating tablet Allow 1-2 tablets to dissolve in your mouth every 8 hours as needed for nausea/vomiting  . pantoprazole (PROTONIX) 40 MG tablet Take 1 tablet (40 mg total) by mouth daily.  . rosuvastatin (CRESTOR) 20 MG tablet Take 1 tablet by mouth once daily  . senna (SENOKOT) 8.6 MG TABS tablet Take 1 tablet  by mouth at bedtime.   . sertraline (ZOLOFT) 100 MG tablet Take 1 tablet (100 mg total) by mouth daily.  . SUMAtriptan (IMITREX) 20 MG/ACT nasal spray Place 1 spray (20 mg total) into the nose as needed for migraine or headache. May repeat in 2 hours if headache persists or recurs.  . traMADol (ULTRAM) 50 MG tablet Take 1 tablet (50 mg total) by mouth every 12 (twelve) hours as needed.  . tretinoin (RETIN-A) 0.05 % cream Apply 1 application topically at bedtime.   No facility-administered encounter medications on file as of 08/16/2019.    Activities of Daily Living In your present state of health, do you have any difficulty performing the following activities: 08/16/2019  Hearing? Y  Comment Hearing aid  Vision? N  Difficulty concentrating or making decisions? N  Walking or climbing stairs? N  Dressing or bathing? N  Doing errands, shopping? N  Preparing Food and eating ? N  Using the Toilet? N  In the past six months, have you accidently leaked urine? N  Do you have problems with loss of bowel control? N  Managing your Medications? N  Managing your Finances? N  Housekeeping or managing your Housekeeping? N  Some recent data might be hidden    Patient Care Team: Leone Haven, MD as PCP - General (Family Medicine)      Assessment:   This is a routine wellness examination for [place].  Nurse connected with patient 08/16/19 at  9:00 AM EDT by a telephone enabled telemedicine application and verified that I am speaking with the correct person using two identifiers. Patient stated full name and DOB. Patient gave permission to continue with virtual visit. Patient's location was at home and Nurse's location was at [place] office.   Patient is alert and oriented x3. Patient denies difficulty focusing or concentrating. Patient likes to read, plays with 5 year old grandson and completes for brain stimulation.   Health Maintenance Due: -Dexa Scan- scheduled 12/13/19 -Mammogram- scheduled 12/13/19 -Tdap vaccine discussed; plans to complete at local pharmacy.  See completed HM at the end of note.   Eye: Visual acuity not assessed. Virtual visit. Followed by their ophthalmologist.  Dental: Visits every 6 months.    Hearing: Hearing aids- yes  Safety:  Patient feels safe at home- yes Patient does have smoke detectors at home- yes Patient does wear sunscreen or protective clothing when in direct sunlight - yes Patient does wear seat belt when in a moving vehicle - yes Patient drives- yes Adequate lighting in walkways free from debris- yes Grab bars and handrails used as appropriate- yes Ambulates with an assistive device- no Cell phone on person when ambulating outside of the home- yes  Social: Alcohol intake - no    Smoking history- never   Smokers in home? none Illicit drug use? none  Medication: Taking as directed and without issues.  Pill box in use -yes  Self managed - yes   Covid-19: Precautions and sickness symptoms discussed. Wears mask, social distancing, hand hygiene as appropriate.   Activities of Daily Living Patient denies needing assistance with: household chores, feeding themselves, getting from bed to chair, getting to the toilet, bathing/showering, dressing, managing money, or  preparing meals.   Discussed the importance of a healthy diet, water intake and the benefits of aerobic exercise. Physical activity- no routine. Encouraged to stay active.   Diet:  Regular Water: good intake; 64 ounces plus Caffeine: 2 cups of coffee  Other Providers Patient Care  Team: Leone Haven, MD as PCP - General (Family Medicine)  Exercise Activities and Dietary recommendations Current Exercise Habits: The patient does not participate in regular exercise at present  Goals      Patient Stated   . Increase physical activity (pt-stated)     Walk for exercise as tolerated.       Fall Risk Fall Risk  08/16/2019 05/17/2019 08/09/2018 05/30/2018 05/12/2018  Falls in the past year? 0 0 0 0 0  Number falls in past yr: - 0 0 0 -  Injury with Fall? - - 0 0 -  Follow up Falls evaluation completed Falls evaluation completed - - -   Timed Get Up and Go performed: no, virtual visit  Depression Screen PHQ 2/9 Scores 08/16/2019 08/14/2019 05/17/2019 08/09/2018  PHQ - 2 Score 0 0 0 0  PHQ- 9 Score - - - -     Cognitive Function     6CIT Screen 08/16/2019 08/09/2018  What Year? 0 points 0 points  What month? 0 points 0 points  What time? 0 points 0 points  Count back from 20 0 points 0 points  Months in reverse 0 points 0 points  Repeat phrase - 0 points  Total Score - 0    Immunization History  Administered Date(s) Administered  . Influenza Whole 06/04/2008  . Influenza, High Dose Seasonal PF 08/11/2017, 05/30/2018  . Influenza,inj,Quad PF,6+ Mos 03/06/2014  . Influenza-Unspecified 03/21/2012, 03/01/2013, 04/30/2015  . PFIZER SARS-COV-2 Vaccination 06/08/2019, 07/01/2019  . Pneumococcal Conjugate-13 10/25/2013  . Pneumococcal Polysaccharide-23 10/19/2012  . Td 06/01/2005   Screening Tests Health Maintenance  Topic Date Due  . TETANUS/TDAP  06/02/2015  . INFLUENZA VACCINE  08/30/2019 (Originally 12/31/2018)  . MAMMOGRAM  12/07/2020  . COLONOSCOPY  06/28/2023    . DEXA SCAN  Completed  . Hepatitis C Screening  Completed      Plan:   Keep all routine maintenance appointments.   Medicare Attestation I have personally reviewed: The patient's medical and social history Their use of alcohol, tobacco or illicit drugs Their current medications and supplements The patient's functional ability including ADLs,fall risks, home safety risks, cognitive, and hearing and visual impairment Diet and physical activities Evidence for depression   I have reviewed and discussed with patient certain preventive protocols, quality metrics, and best practice recommendations.      Varney Biles, LPN  2/77/[id number]

## 2019-08-22 ENCOUNTER — Other Ambulatory Visit: Payer: Self-pay | Admitting: General Surgery

## 2019-08-22 DIAGNOSIS — C50412 Malignant neoplasm of upper-outer quadrant of left female breast: Secondary | ICD-10-CM | POA: Diagnosis not present

## 2019-08-22 DIAGNOSIS — Z86010 Personal history of colonic polyps: Secondary | ICD-10-CM | POA: Diagnosis not present

## 2019-08-22 DIAGNOSIS — Z170 Estrogen receptor positive status [ER+]: Secondary | ICD-10-CM | POA: Diagnosis not present

## 2019-08-29 ENCOUNTER — Encounter: Payer: Self-pay | Admitting: Family Medicine

## 2019-08-30 MED ORDER — SERTRALINE HCL 100 MG PO TABS: 150.0000 mg | ORAL_TABLET | Freq: Every day | ORAL | 1 refills | Status: DC

## 2019-09-05 ENCOUNTER — Other Ambulatory Visit: Payer: Self-pay

## 2019-09-05 ENCOUNTER — Encounter: Payer: Self-pay | Admitting: Family Medicine

## 2019-09-05 MED ORDER — SERTRALINE HCL 100 MG PO TABS: 150.0000 mg | ORAL_TABLET | Freq: Every day | ORAL | 1 refills | Status: DC

## 2019-09-08 ENCOUNTER — Other Ambulatory Visit: Payer: Self-pay | Admitting: Oncology

## 2019-10-02 ENCOUNTER — Other Ambulatory Visit
Admission: RE | Admit: 2019-10-02 | Discharge: 2019-10-02 | Disposition: A | Discharge status: Home / Self Care | Payer: PPO | Source: Ambulatory Visit | Attending: General Surgery | Admitting: General Surgery

## 2019-10-02 ENCOUNTER — Other Ambulatory Visit: Payer: PPO

## 2019-10-02 DIAGNOSIS — Z01812 Encounter for preprocedural laboratory examination: Secondary | ICD-10-CM | POA: Diagnosis not present

## 2019-10-02 DIAGNOSIS — Z20822 Contact with and (suspected) exposure to covid-19: Secondary | ICD-10-CM | POA: Diagnosis not present

## 2019-10-02 LAB — SARS CORONAVIRUS 2 (TAT 6-24 HRS): SARS Coronavirus 2: NEGATIVE

## 2019-10-04 ENCOUNTER — Ambulatory Visit
Admission: RE | Admit: 2019-10-04 | Discharge: 2019-10-04 | Disposition: A | Discharge status: Home / Self Care | Payer: PPO | Attending: General Surgery | Admitting: General Surgery

## 2019-10-04 ENCOUNTER — Ambulatory Visit: Payer: PPO | Admitting: Anesthesiology

## 2019-10-04 ENCOUNTER — Encounter: Payer: Self-pay | Admitting: General Surgery

## 2019-10-04 ENCOUNTER — Other Ambulatory Visit: Payer: Self-pay

## 2019-10-04 ENCOUNTER — Encounter
Admission: RE | Disposition: A | Discharge status: Home / Self Care | Payer: Self-pay | Source: Home / Self Care | Attending: General Surgery

## 2019-10-04 DIAGNOSIS — K5730 Diverticulosis of large intestine without perforation or abscess without bleeding: Secondary | ICD-10-CM | POA: Insufficient documentation

## 2019-10-04 DIAGNOSIS — F329 Major depressive disorder, single episode, unspecified: Secondary | ICD-10-CM | POA: Diagnosis not present

## 2019-10-04 DIAGNOSIS — G43909 Migraine, unspecified, not intractable, without status migrainosus: Secondary | ICD-10-CM | POA: Diagnosis not present

## 2019-10-04 DIAGNOSIS — J45909 Unspecified asthma, uncomplicated: Secondary | ICD-10-CM | POA: Insufficient documentation

## 2019-10-04 DIAGNOSIS — D124 Benign neoplasm of descending colon: Secondary | ICD-10-CM | POA: Diagnosis not present

## 2019-10-04 DIAGNOSIS — I10 Essential (primary) hypertension: Secondary | ICD-10-CM | POA: Diagnosis not present

## 2019-10-04 DIAGNOSIS — K219 Gastro-esophageal reflux disease without esophagitis: Secondary | ICD-10-CM | POA: Insufficient documentation

## 2019-10-04 DIAGNOSIS — Z853 Personal history of malignant neoplasm of breast: Secondary | ICD-10-CM | POA: Insufficient documentation

## 2019-10-04 DIAGNOSIS — G4733 Obstructive sleep apnea (adult) (pediatric): Secondary | ICD-10-CM | POA: Insufficient documentation

## 2019-10-04 DIAGNOSIS — Z79899 Other long term (current) drug therapy: Secondary | ICD-10-CM | POA: Diagnosis not present

## 2019-10-04 DIAGNOSIS — F419 Anxiety disorder, unspecified: Secondary | ICD-10-CM | POA: Insufficient documentation

## 2019-10-04 DIAGNOSIS — K635 Polyp of colon: Secondary | ICD-10-CM | POA: Diagnosis not present

## 2019-10-04 DIAGNOSIS — Z885 Allergy status to narcotic agent status: Secondary | ICD-10-CM | POA: Insufficient documentation

## 2019-10-04 DIAGNOSIS — K621 Rectal polyp: Secondary | ICD-10-CM | POA: Diagnosis not present

## 2019-10-04 DIAGNOSIS — E785 Hyperlipidemia, unspecified: Secondary | ICD-10-CM | POA: Diagnosis not present

## 2019-10-04 DIAGNOSIS — K5790 Diverticulosis of intestine, part unspecified, without perforation or abscess without bleeding: Secondary | ICD-10-CM | POA: Diagnosis not present

## 2019-10-04 DIAGNOSIS — Z79811 Long term (current) use of aromatase inhibitors: Secondary | ICD-10-CM | POA: Insufficient documentation

## 2019-10-04 DIAGNOSIS — D122 Benign neoplasm of ascending colon: Secondary | ICD-10-CM | POA: Diagnosis not present

## 2019-10-04 DIAGNOSIS — Z86010 Personal history of colonic polyps: Secondary | ICD-10-CM | POA: Insufficient documentation

## 2019-10-04 DIAGNOSIS — Z791 Long term (current) use of non-steroidal anti-inflammatories (NSAID): Secondary | ICD-10-CM | POA: Diagnosis not present

## 2019-10-04 DIAGNOSIS — M1990 Unspecified osteoarthritis, unspecified site: Secondary | ICD-10-CM | POA: Insufficient documentation

## 2019-10-04 DIAGNOSIS — Z1211 Encounter for screening for malignant neoplasm of colon: Secondary | ICD-10-CM | POA: Diagnosis not present

## 2019-10-04 DIAGNOSIS — D128 Benign neoplasm of rectum: Secondary | ICD-10-CM | POA: Insufficient documentation

## 2019-10-04 DIAGNOSIS — Z888 Allergy status to other drugs, medicaments and biological substances status: Secondary | ICD-10-CM | POA: Insufficient documentation

## 2019-10-04 DIAGNOSIS — F418 Other specified anxiety disorders: Secondary | ICD-10-CM | POA: Diagnosis not present

## 2019-10-04 HISTORY — PX: COLONOSCOPY WITH PROPOFOL: SHX5780

## 2019-10-04 SURGERY — COLONOSCOPY WITH PROPOFOL
Anesthesia: General

## 2019-10-04 MED ORDER — PROPOFOL 10 MG/ML IV BOLUS
INTRAVENOUS | Status: AC
  Filled 2019-10-04: qty 20

## 2019-10-04 MED ORDER — PROPOFOL 500 MG/50ML IV EMUL
INTRAVENOUS | Status: AC
  Filled 2019-10-04: qty 50

## 2019-10-04 MED ORDER — PROPOFOL 500 MG/50ML IV EMUL
INTRAVENOUS | Status: DC | PRN
  Administered 2019-10-04: 140 ug/kg/min via INTRAVENOUS

## 2019-10-04 MED ORDER — PROPOFOL 10 MG/ML IV BOLUS
INTRAVENOUS | Status: DC | PRN
  Administered 2019-10-04: 90 mg via INTRAVENOUS

## 2019-10-04 MED ORDER — SODIUM CHLORIDE 0.9 % IV SOLN: INTRAVENOUS | Status: DC

## 2019-10-04 MED ORDER — PROPOFOL 10 MG/ML IV BOLUS: INTRAVENOUS | Status: DC | PRN

## 2019-10-04 NOTE — Op Note (Signed)
[facility] Gastroenterology Patient Name: Dana Obrien Procedure Date: 10/04/2019 6:49 AM MRN: [id number] Account #: [id number] Date of Birth: 1945/12/19 Admit Type: Outpatient Age: 74 Room: [facility] ENDO ROOM 1 Gender: Female Note Status: Finalized Procedure:             Colonoscopy Indications:           High risk colon cancer surveillance: Personal history                         of colonic polyps Providers:             Robert Bellow, MD Medicines:             Monitored Anesthesia Care Complications:         No immediate complications. Procedure:             Pre-Anesthesia Assessment:                        - Prior to the procedure, a History and Physical was                         performed, and patient medications, allergies and                         sensitivities were reviewed. The patient's tolerance                         of previous anesthesia was reviewed.                        - The risks and benefits of the procedure and the                         sedation options and risks were discussed with the                         patient. All questions were answered and informed                         consent was obtained.                        After obtaining informed consent, the colonoscope was                         passed under direct vision. Throughout the procedure,                         the patient's blood pressure, pulse, and oxygen                         saturations were monitored continuously. The                         Colonoscope was introduced through the anus and                         advanced to the the terminal ileum. The colonoscopy  was performed without difficulty. The patient                         tolerated the procedure well. The quality of the bowel                         preparation was excellent. Findings:      A 5 mm polyp was found in the rectum, mid descending colon and proximal   ascending colon (x2). The polyp was sessile. Biopsies were taken with a       cold forceps for histology.      The retroflexed view of the distal rectum and anal verge was normal and       showed no anal or rectal abnormalities.      Multiple medium-mouthed diverticula were found in the sigmoid colon. Impression:            - One 5 mm polyp in the rectum in the mid descending                         colon in the proximal ascending colon. Biopsied.                        - The distal rectum and anal verge are normal on                         retroflexion view. Recommendation:        - Telephone endoscopist for pathology results in 1                         week. Procedure Code(s):     --- Professional ---                        [phone], Colonoscopy, flexible; with biopsy, single or                         multiple Diagnosis Code(s):     --- Professional ---                        Z86.010, Personal history of colonic polyps                        K62.1, Rectal polyp                        K63.5, Polyp of colon CPT copyright 2019 American Medical Association. All rights reserved. The codes documented in this report are preliminary and upon coder review may  be revised to meet current compliance requirements. Robert Bellow, MD 10/04/2019 9:10:46 AM This report has been signed electronically. Number of Addenda: 0 Note Initiated On: 10/04/2019 6:49 AM Scope Withdrawal Time: 0 hours 13 minutes 53 seconds  Total Procedure Duration: 0 hours 22 minutes 8 seconds  Estimated Blood Loss:  Estimated blood loss: none.      [facility]

## 2019-10-04 NOTE — Anesthesia Preprocedure Evaluation (Signed)
Anesthesia Evaluation  Patient identified by MRN, date of birth, ID band Patient awake    Reviewed: Allergy & Precautions, H&P , NPO status , Patient's Chart, lab work & pertinent test results  History of Anesthesia Complications (+) PONV and history of anesthetic complications  Airway Mallampati: III  TM Distance: <3 FB Neck ROM: limited    Dental  (+) Chipped   Pulmonary asthma , sleep apnea and Continuous Positive Airway Pressure Ventilation ,    Pulmonary exam normal        Cardiovascular Exercise Tolerance: Good hypertension, Normal cardiovascular exam     Neuro/Psych  Headaches, PSYCHIATRIC DISORDERS  Neuromuscular disease    GI/Hepatic Neg liver ROS, GERD  Medicated and Controlled,  Endo/Other  negative endocrine ROS  Renal/GU negative Renal ROS  negative genitourinary   Musculoskeletal  (+) Arthritis ,   Abdominal   Peds  Hematology negative hematology ROS (+)   Anesthesia Other Findings Past Medical History: No date: Anxiety No date: Arthritis No date: Asthma 2004: Breast cancer ([place])     Comment:  right breast 09/08/2017: Breast cancer ([place])     Comment:  left breast 09/17/2017: Breast cancer of upper-outer quadrant of left female  breast ([place])     Comment:  T1c, N0, ER 90%, PR 90%, 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 2004: Cancer [facility])     Comment:  right lumpectomy, chemotherapy and radiation Dr. Jeb Levering No date: Depression No date: GERD (gastroesophageal reflux disease) No date: Hypertension No date: Migraine 2004: Personal history of chemotherapy 2004: Personal history of radiation therapy 2019: Personal history of radiation therapy No date: PONV (postoperative nausea and vomiting) No date: Shingles No date: Sleep apnea     Comment:  uses CPAP, severe OSA No date: Vitamin D deficiency     Comment:  IN THE PAST  Past Surgical History: No date: ABDOMINAL HYSTERECTOMY No date: ANTERIOR AND  POSTERIOR REPAIR No date: BREAST BIOPSY; Right     Comment:  stereo prior to lumpectomy 09/07/2017: BREAST BIOPSY; Left     Comment:  US guided biopsy/positive- invasaive mammary carcinoma 11/29/2002: BREAST EXCISIONAL BIOPSY; Right     Comment:  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  3.5 cm maximum diameter, minimal margins 5 mm.               In situ component less than 10%. 2004: BREAST LUMPECTOMY; Right 09/17/2017: BREAST LUMPECTOMY; Left     Comment:  Procedure: BREAST EXCISION, SENTINEL NODE BIOPSY;                Surgeon: Robert Bellow, MD;  Location: [facility] ORS;                Service: General;  Laterality: Left; 2015: COLONOSCOPY 11/05/2016: ETHMOIDECTOMY; Bilateral     Comment:  Procedure: ETHMOIDECTOMY;  Surgeon: Melida Quitter, MD;                Location: [place];  Service: ENT;  Laterality: Bilateral; 11/05/2016: MAXILLARY ANTROSTOMY; Bilateral     Comment:  Procedure: MAXILLARY ANTROSTOMY;  Surgeon: Melida Quitter, MD;  Location: [place];  Service: ENT;  Laterality:              Bilateral; 11/05/2016: SINUS ENDO W/FUSION; Bilateral     Comment:  Procedure: FRONTAL RECESS EXPLORATION;  Surgeon: Redmond Baseman,  Orpah Greek, MD;  Location: [place];  Service: ENT;  Laterality:              Bilateral;  BILATERAL ENDOSCOPIC SINUS SURGERY WITH               FUSION 11/05/2016: SINUS EXPLORATION 11/05/2016: SPHENOIDECTOMY; Bilateral     Comment:  Procedure: SPHENOIDECTOMY;  Surgeon: Melida Quitter, MD;               Location: [facility] OR;  Service: ENT;  Laterality: Bilateral;  BMI    Body Mass Index: 32.96 kg/m      Reproductive/Obstetrics negative OB ROS                             Anesthesia Physical Anesthesia Plan  ASA: III  Anesthesia Plan: General   Post-op Pain Management:    Induction: Intravenous  PONV Risk Score and Plan: Propofol infusion and TIVA  Airway Management Planned: Natural Airway  and Nasal Cannula  Additional Equipment:   Intra-op Plan:   Post-operative Plan:   Informed Consent: I have reviewed the patients History and Physical, chart, labs and discussed the procedure including the risks, benefits and alternatives for the proposed anesthesia with the patient or authorized representative who has indicated his/her understanding and acceptance.     Dental Advisory Given  Plan Discussed with: Anesthesiologist, CRNA and Surgeon  Anesthesia Plan Comments: (Patient consented for risks of anesthesia including but not limited to:  - adverse reactions to medications - risk of intubation if required - damage to eyes, teeth, lips or other oral mucosa - nerve damage due to positioning  - sore throat or hoarseness - Damage to heart, brain, nerves, lungs or loss of life  Patient voiced understanding.)        Anesthesia Quick Evaluation

## 2019-10-04 NOTE — Anesthesia Postprocedure Evaluation (Signed)
Anesthesia Post Note  Patient: Dana Obrien  Procedure(s) Performed: COLONOSCOPY WITH PROPOFOL (N/A )  Patient location during evaluation: Endoscopy Anesthesia Type: General Level of consciousness: awake and alert Pain management: pain level controlled Vital Signs Assessment: post-procedure vital signs reviewed and stable Respiratory status: spontaneous breathing, nonlabored ventilation, respiratory function stable and patient connected to nasal cannula oxygen Cardiovascular status: blood pressure returned to baseline and stable Postop Assessment: no apparent nausea or vomiting Anesthetic complications: no     Last Vitals:  Vitals:   10/04/19 0932 10/04/19 0942  BP: 136/76 (!) 157/67  Pulse: 61 63  Resp: 20 (!) 26  Temp:    SpO2: 97% 96%    Last Pain:  Vitals:   10/04/19 0932  TempSrc:   PainSc: 0-No pain                 Precious Haws Kalifa Cadden

## 2019-10-04 NOTE — H&P (Signed)
Dana Obrien [id number] Sep 10, 1945     HPI:  History of colonic polyps. For f/u exam. Tolerated prep with modest nausea, no vomiting. Exam in January 2015 showed polyps in the hepatic flexure and transverse colon.   Medications Prior to Admission  Medication Sig Dispense Refill Last Dose  . celecoxib (CELEBREX) 100 MG capsule Take 1 capsule by mouth twice daily 60 capsule 0 10/03/2019 at 0800  . cetirizine (EQ ALLERGY RELIEF, CETIRIZINE,) 10 MG tablet Take 1 tablet (10 mg total) by mouth daily. 90 tablet 0 10/03/2019 at 0800  . fluticasone (FLONASE) 50 MCG/ACT nasal spray Place 1 spray into both nostrils daily. 16 g 5 Past Week at Unknown time  . hydrochlorothiazide (HYDRODIURIL) 25 MG tablet Take 1 tablet (25 mg total) by mouth daily. 90 tablet 1 10/03/2019 at 0800  . hydrOXYzine (ATARAX/VISTARIL) 10 MG tablet Take 1 tablet (10 mg total) by mouth 3 (three) times daily as needed for itching. 90 tablet 1 Past Week at Unknown time  . letrozole (FEMARA) 2.5 MG tablet Take 1 tablet by mouth once daily 90 tablet 0 10/03/2019 at 0800  . Multiple Vitamin (MULTI-VITAMIN) tablet Take by mouth.   10/03/2019 at 0800  . ondansetron (ZOFRAN ODT) 4 MG disintegrating tablet Allow 1-2 tablets to dissolve in your mouth every 8 hours as needed for nausea/vomiting 30 tablet 0 Past Week at Unknown time  . pantoprazole (PROTONIX) 40 MG tablet Take 1 tablet (40 mg total) by mouth daily. 90 tablet 2 10/03/2019 at 0800  . rosuvastatin (CRESTOR) 20 MG tablet Take 1 tablet by mouth once daily 90 tablet 0 10/03/2019 at 0800  . senna (SENOKOT) 8.6 MG TABS tablet Take 1 tablet by mouth at bedtime.    Past Week at Unknown time  . sertraline (ZOLOFT) 100 MG tablet Take 1.5 tablets (150 mg total) by mouth daily. 135 tablet 1 10/03/2019 at 0800  . SUMAtriptan (IMITREX) 20 MG/ACT nasal spray Place 1 spray (20 mg total) into the nose as needed for migraine or headache. May repeat in 2 hours if headache persists or recurs. 1 Inhaler 2 Past  Week at Unknown time  . traMADol (ULTRAM) 50 MG tablet Take 1 tablet (50 mg total) by mouth every 12 (twelve) hours as needed. 30 tablet 0 Past Week at Unknown time  . tretinoin (RETIN-A) 0.05 % cream Apply 1 application topically at bedtime. 45 g 0 10/03/2019 at 0800  . albuterol (VENTOLIN HFA) 108 (90 Base) MCG/ACT inhaler INHALE 2 PUFFS BY MOUTH EVERY 6 HOURS AS NEEDED (Patient not taking: Reported on 10/04/2019) 9 g 0 Not Taking at Unknown time   Allergies  Allergen Reactions  . Lidocaine Palpitations  . Codeine Nausea Only and Nausea And Vomiting  . Morphine Nausea Only  . Niacin Rash  . Niacin And Related Rash    ANTIHYPERLIPEMICS   Past Medical History:  Diagnosis Date  . Anxiety   . Arthritis   . Asthma   . Breast cancer ([place]) 2004   right breast  . Breast cancer ([place]) 09/08/2017   left breast  . Breast cancer of upper-outer quadrant of left female breast ([place]) 09/17/2017   T1c, N0, ER 90%, PR 90%, 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  . Cancer [facility]) 2004   right lumpectomy, chemotherapy and radiation Dr. Jeb Levering  . Depression   . GERD (gastroesophageal reflux disease)   . Hypertension   . Migraine   . Personal history of chemotherapy 2004  . Personal history of radiation therapy 2004  .  Personal history of radiation therapy 2019  . PONV (postoperative nausea and vomiting)   . Shingles   . Sleep apnea    uses CPAP, severe OSA  . Vitamin D deficiency    IN THE PAST   Past Surgical History:  Procedure Laterality Date  . ABDOMINAL HYSTERECTOMY    . ANTERIOR AND POSTERIOR REPAIR    . BREAST BIOPSY Right    stereo prior to lumpectomy  . BREAST BIOPSY Left 09/07/2017   US guided biopsy/positive- invasaive mammary carcinoma  . 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  3.5 cm maximum diameter, minimal margins 5 mm.  In situ component less than 10%.  Marland Kitchen BREAST LUMPECTOMY Right 2004  . BREAST LUMPECTOMY Left  09/17/2017   Procedure: BREAST EXCISION, SENTINEL NODE BIOPSY;  Surgeon: Robert Bellow, MD;  Location: [facility] ORS;  Service: General;  Laterality: Left;  . COLONOSCOPY  2015  . ETHMOIDECTOMY Bilateral 11/05/2016   Procedure: ETHMOIDECTOMY;  Surgeon: Melida Quitter, MD;  Location: [place];  Service: ENT;  Laterality: Bilateral;  . MAXILLARY ANTROSTOMY Bilateral 11/05/2016   Procedure: MAXILLARY ANTROSTOMY;  Surgeon: Melida Quitter, MD;  Location: [place];  Service: ENT;  Laterality: Bilateral;  . SINUS ENDO W/FUSION Bilateral 11/05/2016   Procedure: FRONTAL RECESS EXPLORATION;  Surgeon: Melida Quitter, MD;  Location: [place];  Service: ENT;  Laterality: Bilateral;  BILATERAL ENDOSCOPIC SINUS SURGERY WITH FUSION  . SINUS EXPLORATION  11/05/2016  . SPHENOIDECTOMY Bilateral 11/05/2016   Procedure: SPHENOIDECTOMY;  Surgeon: Melida Quitter, MD;  Location: [facility] OR;  Service: ENT;  Laterality: Bilateral;   Social History   Socioeconomic History  . Marital status: Widowed    Spouse name: Not on file  . Number of children: Not on file  . Years of education: Not on file  . Highest education level: Not on file  Occupational History  . Not on file  Tobacco Use  . Smoking status: Never Smoker  . Smokeless tobacco: Never Used  Substance and Sexual Activity  . Alcohol use: Yes    Comment: rarely  . Drug use: No  . Sexual activity: Not Currently  Other Topics Concern  . Not on file  Social History Narrative   Daily Caffeine Use:  1-2 coffee   Regular Exercise -  NO   Social Determinants of Health   Financial Resource Strain:   . Difficulty of Paying Living Expenses:   Food Insecurity:   . Worried About Charity fundraiser in the Last Year:   . Arboriculturist in the Last Year:   Transportation Needs: No Transportation Needs  . Lack of Transportation (Medical): No  . Lack of Transportation (Non-Medical): No  Physical Activity: Unknown  . Days of Exercise per Week: 0 days  . Minutes of Exercise per  Session: Not on file  Stress:   . Feeling of Stress :   Social Connections: Unknown  . Frequency of Communication with Friends and Family: More than three times a week  . Frequency of Social Gatherings with Friends and Family: More than three times a week  . Attends Religious Services: Not on file  . Active Member of Clubs or Organizations: Not on file  . Attends Archivist Meetings: Not on file  . Marital Status: Widowed  Intimate Partner Violence: Not At Risk  . Fear of Current or Ex-Partner: No  . Emotionally Abused: No  . Physically Abused: No  . Sexually  Abused: No   Social History   Social History Narrative   Daily Caffeine Use:  1-2 coffee   Regular Exercise -  NO     ROS: Negative.     PE: HEENT: Negative. Lungs: Clear. Cardio: RR.  Assessment/Plan:  Proceed with planned endoscopy. Forest Gleason [facility] 10/04/2019

## 2019-10-04 NOTE — Transfer of Care (Signed)
Immediate Anesthesia Transfer of Care Note  Patient: Dana Obrien  Procedure(s) Performed: COLONOSCOPY WITH PROPOFOL (N/A )  Patient Location: Endoscopy Unit  Anesthesia Type:General  Level of Consciousness: sedated  Airway & Oxygen Therapy: Patient Spontanous Breathing  Post-op Assessment: Report given to RN and Post -op Vital signs reviewed and stable  Post vital signs: Reviewed and stable  Last Vitals:  Vitals Value Taken Time  BP 99/61 10/04/19 0913  Temp 36.4 C 10/04/19 0912  Pulse 60 10/04/19 0913  Resp 14 10/04/19 0913  SpO2 92 % 10/04/19 0913  Vitals shown include unvalidated device data.  Last Pain:  Vitals:   10/04/19 0912  TempSrc: Temporal  PainSc:          Complications: No apparent anesthesia complications

## 2019-10-05 ENCOUNTER — Encounter: Payer: Self-pay | Admitting: *Deleted

## 2019-10-05 LAB — SURGICAL PATHOLOGY

## 2019-10-06 ENCOUNTER — Other Ambulatory Visit: Payer: Self-pay | Admitting: Family Medicine

## 2019-10-06 ENCOUNTER — Encounter: Payer: Self-pay | Admitting: Family Medicine

## 2019-10-06 MED ORDER — TRAMADOL HCL 50 MG PO TABS: 50.0000 mg | ORAL_TABLET | Freq: Two times a day (BID) | ORAL | 0 refills | Status: DC | PRN

## 2019-10-06 MED ORDER — PANTOPRAZOLE SODIUM 40 MG PO TBEC: 40.0000 mg | DELAYED_RELEASE_TABLET | Freq: Every day | ORAL | 2 refills | Status: DC

## 2019-10-06 NOTE — Telephone Encounter (Signed)
Refill request for tramadol, last seen 08-14-19, last filled 07-28-19.  Please advise.

## 2019-10-06 NOTE — Telephone Encounter (Signed)
Refilled

## 2019-11-01 NOTE — Progress Notes (Signed)
Office Visit Note  Patient: Dana Obrien             Date of Birth: August 24, 1945           MRN: [id number]             PCP: Leone Haven, MD Referring: Leone Haven, MD Visit Date: 11/10/2019 Occupation: @GUAROCC @  Subjective:  Pain in multiple joints.   History of Present Illness: Dana Obrien is a 74 y.o. female seen in consultation per request of her PCP.  According to the patient she has had lower back pain for the last 3 to 4 years.  She states she has had injections in her lower back at [facility] clinic which were not helpful.  She was later seen by Dr. Patrice Paradise who diagnosed her with degenerative disc disease and scoliosis.  She was placed on Celebrex and tramadol which was initially helpful.  She states she has had flares of increased lower back since then for which she was given prednisone taper.  She has had total 3  prednisone taper which were helpful.  For the last 20 years she has been experiencing increased hand pain and decreased grip strength in her right hand.  In October 2020 she fell as she missed a step and landed backwards.  She hurt her shoulder and went to the urgent care where she had x-ray and she was told that the x-ray did not show any fracture.  She had some arthritis.  She states since then she has been having discomfort in her right arm and lifting her right arm.  Her knee joints also hurt off and on.  She denies any joint swelling.  She states she quit taking Celebrex as it was not helping her anymore.  She takes aspirin on as needed basis.  Both parents had osteoarthritis and her sisters have osteoarthritis.  Activities of Daily Living:  Patient reports morning stiffness for  10-15  minutes.   Patient Reports nocturnal pain.  Difficulty dressing/grooming: Denies Difficulty climbing stairs: Reports Difficulty getting out of chair: Reports Difficulty using hands for taps, buttons, cutlery, and/or writing: Reports  Review of Systems    Constitutional: Positive for fatigue. Negative for night sweats, weight gain and weight loss.  HENT: Negative for mouth sores, trouble swallowing, trouble swallowing, mouth dryness and nose dryness.   Eyes: Negative for pain, redness, itching, visual disturbance and dryness.  Respiratory: Negative for cough, shortness of breath and difficulty breathing.   Cardiovascular: Negative for chest pain, palpitations, hypertension, irregular heartbeat and swelling in legs/feet.  Gastrointestinal: Negative for blood in stool, constipation and diarrhea.  Endocrine: Negative for increased urination.  Genitourinary: Negative for difficulty urinating and vaginal dryness.  Musculoskeletal: Positive for arthralgias, joint pain and morning stiffness. Negative for joint swelling, myalgias, muscle weakness, muscle tenderness and myalgias.  Skin: Negative for color change, rash, hair loss, redness, skin tightness, ulcers and sensitivity to sunlight.  Allergic/Immunologic: Negative for susceptible to infections.  Neurological: Positive for headaches and weakness. Negative for dizziness, numbness, memory loss and night sweats.       History of infrequent migraines about once every 6 months.  Hematological: Negative for bruising/bleeding tendency and swollen glands.  Psychiatric/Behavioral: Negative for depressed mood, confusion and sleep disturbance. The patient is not nervous/anxious.     PMFS History:  Patient Active Problem List   Diagnosis Date Noted  . Allergic rhinitis 07/05/2019  . Brain atrophy ([place]) 05/18/2019  . Bilateral hearing loss 08/15/2018  .  Fatigue 08/15/2018  . Chest tightness 06/02/2018  . Prediabetes 06/02/2018  . Arthralgia 06/02/2018  . Congestion of both ears 06/02/2018  . BPPV (benign paroxysmal positional vertigo), right 03/11/2018  . Dysfunction of right eustachian tube 03/11/2018  . Malignant neoplasm of upper-outer quadrant of left breast in female, estrogen receptor positive  ([place]) 09/14/2017  . Radiculopathy 08/24/2017  . Fatty liver 08/24/2017  . Abdominal pain 08/24/2017  . Hearing difficulty of both ears 06/25/2017  . Chronic sinusitis 11/05/2016  . Bilateral sensorineural hearing loss 10/13/2016  . Right ear impacted cerumen 10/13/2016  . GERD (gastroesophageal reflux disease) 09/07/2016  . Cough 07/08/2016  . Exertional shortness of breath 01/15/2016  . Uterine prolapse 11/14/2015  . OSA (obstructive sleep apnea) 11/01/2015  . Back muscle spasm 04/30/2015  . DDD (degenerative disc disease), lumbar 04/30/2015  . Lumbar radiculitis 04/30/2015  . Low back pain 01/01/2015  . Varicose vein of leg 11/22/2014  . Post herpetic neuralgia 03/06/2014  . Increased frequency of urination 11/17/2013  . Rectocele 11/17/2013  . Cystocele 10/25/2013  . Skin lesion 10/25/2013  . Disorder of skin and subcutaneous tissue 10/25/2013  . Midline cystocele 10/25/2013  . Pelvic and perineal pain 10/25/2013  . Obesity, unspecified 10/19/2012  . History of breast cancer 02/11/2012  . Hyperlipidemia 05/11/2011  . Osteopenia 05/11/2011  . Disorder of bone and cartilage 05/11/2011  . Anxiety and depression 03/04/2011  . Hypertension 03/04/2011    Past Medical History:  Diagnosis Date  . Anxiety   . Arthritis   . Asthma   . Breast cancer ([place]) 2004   right breast  . Breast cancer ([place]) 09/08/2017   left breast  . Breast cancer of upper-outer quadrant of left female breast ([place]) 09/17/2017   T1c, N0, ER 90%, PR 90%, 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  . Cancer [facility]) 2004   right lumpectomy, chemotherapy and radiation Dr. Jeb Levering  . Depression   . GERD (gastroesophageal reflux disease)   . Hypertension   . Migraine   . Personal history of chemotherapy 2004  . Personal history of radiation therapy 2004  . Personal history of radiation therapy 2019  . PONV (postoperative nausea and vomiting)   . Shingles   . Sleep apnea    uses CPAP, severe OSA  . Vitamin D  deficiency    IN THE PAST    Family History  Problem Relation Age of Onset  . Heart disease Mother   . Hyperlipidemia Mother   . Hypertension Mother   . Hypertension Father   . Osteoarthritis Father   . Stroke Father   . Alzheimer's disease Sister   . Alzheimer's disease Brother   . Brain cancer Brother   . Healthy Daughter   . Healthy Son   . Breast cancer Neg Hx    Past Surgical History:  Procedure Laterality Date  . ABDOMINAL HYSTERECTOMY    . ANTERIOR AND POSTERIOR REPAIR    . BREAST BIOPSY Right    stereo prior to lumpectomy  . BREAST BIOPSY Left 09/07/2017   US guided biopsy/positive- invasaive mammary carcinoma  . 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  3.5 cm maximum diameter, minimal margins 5 mm.  In situ component less than 10%.  Marland Kitchen BREAST LUMPECTOMY Right 2004  . BREAST LUMPECTOMY Left 09/17/2017   Procedure: BREAST EXCISION, SENTINEL NODE BIOPSY;  Surgeon: Robert Bellow, MD;  Location: [facility] ORS;  Service: General;  Laterality: Left;  . COLONOSCOPY  2015  . COLONOSCOPY WITH PROPOFOL N/A 10/04/2019   Procedure: COLONOSCOPY WITH PROPOFOL;  Surgeon: Robert Bellow, MD;  Location: [facility];  Service: Endoscopy;  Laterality: N/A;  . DILATION AND CURETTAGE OF UTERUS    . ETHMOIDECTOMY Bilateral 11/05/2016   Procedure: ETHMOIDECTOMY;  Surgeon: Melida Quitter, MD;  Location: [place];  Service: ENT;  Laterality: Bilateral;  . LAPAROTOMY    . MAXILLARY ANTROSTOMY Bilateral 11/05/2016   Procedure: MAXILLARY ANTROSTOMY;  Surgeon: Melida Quitter, MD;  Location: [place];  Service: ENT;  Laterality: Bilateral;  . SINUS ENDO W/FUSION Bilateral 11/05/2016   Procedure: FRONTAL RECESS EXPLORATION;  Surgeon: Melida Quitter, MD;  Location: [place];  Service: ENT;  Laterality: Bilateral;  BILATERAL ENDOSCOPIC SINUS SURGERY WITH FUSION  . SINUS EXPLORATION  11/05/2016  . SPHENOIDECTOMY Bilateral 11/05/2016   Procedure:  SPHENOIDECTOMY;  Surgeon: Melida Quitter, MD;  Location: [facility] OR;  Service: ENT;  Laterality: Bilateral;   Social History   Social History Narrative   Daily Caffeine Use:  1-2 coffee   Regular Exercise -  NO   Immunization History  Administered Date(s) Administered  . Influenza Whole 06/04/2008  . Influenza, High Dose Seasonal PF 08/11/2017, 05/30/2018  . Influenza,inj,Quad PF,6+ Mos 03/06/2014  . Influenza-Unspecified 03/21/2012, 03/01/2013, 04/30/2015  . PFIZER SARS-COV-2 Vaccination 06/08/2019, 07/01/2019  . Pneumococcal Conjugate-13 10/25/2013  . Pneumococcal Polysaccharide-23 10/19/2012  . Td 06/01/2005     Objective: Vital Signs: BP 118/60 (BP Location: Right Arm, Patient Position: Sitting, Cuff Size: Normal)   Pulse 70   Resp 16   Ht 5' 4"  (1.626 m)   Wt 189 lb (85.7 kg)   BMI 32.44 kg/m    Physical Exam Vitals and nursing note reviewed.  Constitutional:      Appearance: She is well-developed.  HENT:     Head: Normocephalic and atraumatic.  Eyes:     Conjunctiva/sclera: Conjunctivae normal.  Cardiovascular:     Rate and Rhythm: Normal rate and regular rhythm.     Heart sounds: Normal heart sounds.  Pulmonary:     Effort: Pulmonary effort is normal.     Breath sounds: Normal breath sounds.  Abdominal:     General: Bowel sounds are normal.     Palpations: Abdomen is soft.  Musculoskeletal:     Cervical back: Normal range of motion.  Lymphadenopathy:     Cervical: No cervical adenopathy.  Skin:    General: Skin is warm and dry.     Capillary Refill: Capillary refill takes less than 2 seconds.  Neurological:     Mental Status: She is alert and oriented to person, place, and time.  Psychiatric:        Behavior: Behavior normal.      Musculoskeletal Exam: Patient has some stiffness range of motion of her cervical spine.  She had painful range of motion of right shoulder joint with abduction up to 110 degrees.  Left shoulder joint is not full range of motion.   Elbow joints in good range of motion.  She is in complete fist formation with her hands especially with the right hand.  She has bilateral PIP DIP and CMC thickening with no synovitis.  Hip joints with good range of motion.  Knee joints were in good range of motion with discomfort.  No warmth swelling or effusion was noted.  Ankle joints with good range of motion.  She had bilateral hammertoes.  CDAI Exam: CDAI Score: -- Patient Global: --; Provider Global: -- Swollen: --; Tender: --  Joint Exam 11/10/2019   No joint exam has been documented for this visit   There is currently no information documented on the homunculus. Go to the Rheumatology activity and complete the homunculus joint exam.  Investigation: No additional findings.  Imaging: XR Hand 2 View Left  Result Date: 11/10/2019 Severe CMC PIP and DIP narrowing was noted.  Second and third MCP narrowing was noted.  No intercarpal radiocarpal joint space narrowing was noted.  No erosive changes were noted. Impression: These findings are consistent with severe osteoarthritis and possible inflammatory arthritis.  XR Hand 2 View Right  Result Date: 11/10/2019 Severe CMC, PIP and DIP narrowing was noted.  Second and third MCP joint narrowing with hanging osteophytes was noted.  No intercarpal radiocarpal joint space narrowing was noted.  No erosive changes were noted. Impression: These findings are consistent with severe osteoarthritis and possible inflammatory arthritis.  XR KNEE 3 VIEW LEFT  Result Date: 11/10/2019 Moderate medial compartment narrowing with intercondylar and lateral osteophytes were noted.  No chondrocalcinosis was noted.  Moderate patellofemoral narrowing was noted. Impression: These findings are consistent with moderate osteoarthritis and moderate chondromalacia patella.  XR KNEE 3 VIEW RIGHT  Result Date: 11/10/2019 Mild medial compartment narrowing with medial osteophytes was noted.  Moderate patellofemoral  narrowing was noted.  No chondrocalcinosis was noted. Impression: These findings are consistent with moderate osteoarthritis and moderate chondromalacia patella.  XR Shoulder Right  Result Date: 11/10/2019 No glenohumeral joint space narrowing was noted.  Inferior spurring was noted.  Acromioclavicular arthritis was noted. Impression: No glenohumeral arthritis was noted.  Acromioclavicular arthritis was noted.   Recent Labs: Lab Results  Component Value Date   WBC 9.4 04/10/2019   HGB 13.7 04/10/2019   PLT 269 04/10/2019   Dana 137 08/03/2019   K 3.8 08/03/2019   CL 102 08/03/2019   CO2 28 08/03/2019   GLUCOSE 96 08/03/2019   BUN 14 08/03/2019   CREATININE 0.75 08/03/2019   BILITOT 1.0 04/10/2019   ALKPHOS 77 04/10/2019   AST 25 04/10/2019   ALT 20 04/10/2019   PROT 8.7 (H) 04/10/2019   ALBUMIN 4.4 04/10/2019   CALCIUM 9.6 08/03/2019   GFRAA >60 04/10/2019    Speciality Comments: No specialty comments available.  Procedures:  No procedures performed Allergies: Lidocaine, Codeine, Morphine, Niacin, and Niacin and related   Assessment / Plan:     Visit Diagnoses: Polyarthralgia - 05/30/18: ANA-, ESR 31, CCP<16, RF<143/10/20: TSH 4.99  Pain in both hands -patient complains of pain and discomfort in her bilateral hands and decreased grip strength.  The clinical findings are consistent with osteoarthritis.  Plan: XR Hand 2 View Right, XR Hand 2 View Left.  X-ray showed severe osteoarthritis involving bilateral hands.  She also had MCP narrowing.  I will obtain additional labs today.  She has had negative autoimmune work-up couple of years ago which I will repeat.  Chronic right shoulder pain -she had injury to her right shoulder joint after a fall.  She states the x-ray initially did not show any fracture.  She has difficulty raising her right arm.  She is in chronic discomfort due to her right shoulder.  She has tried exercises without much relief.  Plan: XR Shoulder Right she has  significant pain and limited range of motion.  X-ray showed mild inferior laxity.  I will obtain MRI to look for internal derangement.  Chronic pain of both knees -she has pain and discomfort in her bilateral knee joints.  No warmth  swelling or effusion was noted.  Plan: XR KNEE 3 VIEW RIGHT, XR KNEE 3 VIEW LEFT.  X-ray showed mild to moderate osteoarthritis and moderate chondromalacia patella.  DDD (degenerative disc disease), lumbar-patient has been seen by Dr. Patrice Paradise.  She states she has had 3 courses of prednisone for lower back flares.  Lumbar radiculitis  Essential hypertension-blood pressure is controlled.  Other medical problems are listed as follows: OSA (obstructive sleep apnea)  Fatty liver  History of gastroesophageal reflux (GERD)  Bilateral sensorineural hearing loss  BPPV (benign paroxysmal positional vertigo), right  Brain atrophy ([facility])  Post herpetic neuralgia  Uterine prolapse  Anxiety and depression  Malignant neoplasm of upper-outer quadrant of left breast in female, estrogen receptor positive ([place]) - Hx of chemo and radiation  History of hyperlipidemia  Vitamin D deficiency  Orders: Orders Placed This Encounter  Procedures  . XR Hand 2 View Right  . XR Hand 2 View Left  . XR Shoulder Right  . XR KNEE 3 VIEW RIGHT  . XR KNEE 3 VIEW LEFT  . MR SHOULDER RIGHT WO CONTRAST  . Sedimentation rate  . Rheumatoid factor  . 14-3-3 eta Protein  . Uric acid  . Cyclic citrul peptide antibody, IgG   No orders of the defined types were placed in this encounter.   Face-to-face time spent with patient was 50 minutes. Greater than 50% of time was spent in counseling and coordination of care.  Follow-Up Instructions: Return for Osteoarthritis.   Bo Merino, MD  Note - This record has been created using Editor, commissioning.  Chart creation errors have been sought, but may not always  have been located. Such creation errors do not reflect on  the  standard of medical care.

## 2019-11-10 ENCOUNTER — Encounter: Payer: Self-pay | Admitting: Rheumatology

## 2019-11-10 ENCOUNTER — Ambulatory Visit: Payer: Self-pay

## 2019-11-10 ENCOUNTER — Other Ambulatory Visit: Payer: Self-pay

## 2019-11-10 ENCOUNTER — Ambulatory Visit: Payer: PPO | Admitting: Rheumatology

## 2019-11-10 VITALS — BP 118/60 | HR 70 | Resp 16 | Ht 64.0 in | Wt 189.0 lb

## 2019-11-10 DIAGNOSIS — G319 Degenerative disease of nervous system, unspecified: Secondary | ICD-10-CM

## 2019-11-10 DIAGNOSIS — H903 Sensorineural hearing loss, bilateral: Secondary | ICD-10-CM

## 2019-11-10 DIAGNOSIS — G4733 Obstructive sleep apnea (adult) (pediatric): Secondary | ICD-10-CM | POA: Diagnosis not present

## 2019-11-10 DIAGNOSIS — M2550 Pain in unspecified joint: Secondary | ICD-10-CM

## 2019-11-10 DIAGNOSIS — M5030 Other cervical disc degeneration, unspecified cervical region: Secondary | ICD-10-CM

## 2019-11-10 DIAGNOSIS — H8111 Benign paroxysmal vertigo, right ear: Secondary | ICD-10-CM

## 2019-11-10 DIAGNOSIS — I10 Essential (primary) hypertension: Secondary | ICD-10-CM

## 2019-11-10 DIAGNOSIS — M5416 Radiculopathy, lumbar region: Secondary | ICD-10-CM

## 2019-11-10 DIAGNOSIS — F32A Depression, unspecified: Secondary | ICD-10-CM

## 2019-11-10 DIAGNOSIS — F419 Anxiety disorder, unspecified: Secondary | ICD-10-CM

## 2019-11-10 DIAGNOSIS — M51369 Other intervertebral disc degeneration, lumbar region without mention of lumbar back pain or lower extremity pain: Secondary | ICD-10-CM

## 2019-11-10 DIAGNOSIS — Z96611 Presence of right artificial shoulder joint: Secondary | ICD-10-CM

## 2019-11-10 DIAGNOSIS — M25511 Pain in right shoulder: Secondary | ICD-10-CM

## 2019-11-10 DIAGNOSIS — M25561 Pain in right knee: Secondary | ICD-10-CM | POA: Diagnosis not present

## 2019-11-10 DIAGNOSIS — M79641 Pain in right hand: Secondary | ICD-10-CM

## 2019-11-10 DIAGNOSIS — B0229 Other postherpetic nervous system involvement: Secondary | ICD-10-CM

## 2019-11-10 DIAGNOSIS — G8929 Other chronic pain: Secondary | ICD-10-CM

## 2019-11-10 DIAGNOSIS — Z8719 Personal history of other diseases of the digestive system: Secondary | ICD-10-CM

## 2019-11-10 DIAGNOSIS — M79642 Pain in left hand: Secondary | ICD-10-CM | POA: Diagnosis not present

## 2019-11-10 DIAGNOSIS — N814 Uterovaginal prolapse, unspecified: Secondary | ICD-10-CM

## 2019-11-10 DIAGNOSIS — E559 Vitamin D deficiency, unspecified: Secondary | ICD-10-CM

## 2019-11-10 DIAGNOSIS — K760 Fatty (change of) liver, not elsewhere classified: Secondary | ICD-10-CM | POA: Diagnosis not present

## 2019-11-10 DIAGNOSIS — C50412 Malignant neoplasm of upper-outer quadrant of left female breast: Secondary | ICD-10-CM

## 2019-11-10 DIAGNOSIS — M5136 Other intervertebral disc degeneration, lumbar region: Secondary | ICD-10-CM | POA: Diagnosis not present

## 2019-11-10 DIAGNOSIS — M25562 Pain in left knee: Secondary | ICD-10-CM

## 2019-11-10 DIAGNOSIS — Z170 Estrogen receptor positive status [ER+]: Secondary | ICD-10-CM

## 2019-11-10 DIAGNOSIS — Z8639 Personal history of other endocrine, nutritional and metabolic disease: Secondary | ICD-10-CM

## 2019-11-10 DIAGNOSIS — F329 Major depressive disorder, single episode, unspecified: Secondary | ICD-10-CM

## 2019-11-15 LAB — CYCLIC CITRUL PEPTIDE ANTIBODY, IGG: Cyclic Citrullin Peptide Ab: <16 UNITS

## 2019-11-15 LAB — URIC ACID: Uric Acid, Serum: 5.2 mg/dL (ref 2.5–7.0)

## 2019-11-15 LAB — SEDIMENTATION RATE: Sed Rate: 9 mm/h (ref 0–30)

## 2019-11-15 LAB — RHEUMATOID FACTOR: Rheumatoid fact SerPl-aCnc: <14 IU/mL (ref <14)

## 2019-11-15 LAB — 14-3-3 ETA PROTEIN: 14-3-3 eta Protein: <0.2 ng/mL (ref <0.2)

## 2019-11-16 NOTE — Progress Notes (Signed)
All the labs are normal.  I will discuss results at the follow-up visit.

## 2019-11-22 ENCOUNTER — Ambulatory Visit: Payer: Self-pay

## 2019-11-22 ENCOUNTER — Ambulatory Visit (INDEPENDENT_AMBULATORY_CARE_PROVIDER_SITE_OTHER): Payer: PPO | Admitting: Rheumatology

## 2019-11-22 ENCOUNTER — Other Ambulatory Visit: Payer: Self-pay

## 2019-11-22 DIAGNOSIS — M79642 Pain in left hand: Secondary | ICD-10-CM | POA: Diagnosis not present

## 2019-11-22 DIAGNOSIS — M79641 Pain in right hand: Secondary | ICD-10-CM | POA: Diagnosis not present

## 2019-12-01 ENCOUNTER — Other Ambulatory Visit: Payer: Self-pay

## 2019-12-01 ENCOUNTER — Ambulatory Visit
Admission: RE | Admit: 2019-12-01 | Discharge: 2019-12-01 | Disposition: A | Discharge status: Home / Self Care | Payer: PPO | Source: Ambulatory Visit | Attending: Rheumatology | Admitting: Rheumatology

## 2019-12-01 DIAGNOSIS — M25511 Pain in right shoulder: Secondary | ICD-10-CM | POA: Diagnosis not present

## 2019-12-01 DIAGNOSIS — Z96611 Presence of right artificial shoulder joint: Secondary | ICD-10-CM | POA: Diagnosis not present

## 2019-12-03 NOTE — Progress Notes (Signed)
Mild rotator cuff tear was noted.  Tendinopathy was noted.  She will benefit from physical therapy.  We can also refer her to orthopedics of her choice.

## 2019-12-04 NOTE — Progress Notes (Signed)
Office Visit Note  Patient: Dana Obrien             Date of Birth: [birth year]           MRN: [id number]             PCP: Leone Haven, MD Referring: Leone Haven, MD Visit Date: 12/18/2019 Occupation: Retired hospice nurse  Subjective:  Right shoulder pain.   History of Present Illness: Dana Obrien is a 74 y.o. female came today along with her daughter Collier Bullock.  According to patient she continues to have pain and discomfort in her right shoulder joint.  She has also stiffness and making a fist.  She has off-and-on discomfort in her knee joints.  None of the other joints are painful.  There is no history of joint swelling.  Her right shoulder joint hurts since she had a fall in the past.  Activities of Daily Living:  Patient reports morning stiffness for 5-10 minutes.   Patient Reports nocturnal pain.  Difficulty dressing/grooming: Denies Difficulty climbing stairs: Reports Difficulty getting out of chair: Reports Difficulty using hands for taps, buttons, cutlery, and/or writing: Reports  Review of Systems  Constitutional: Positive for fatigue. Negative for night sweats, weight gain and weight loss.  HENT: Positive for hearing loss. Negative for mouth sores, trouble swallowing, trouble swallowing, mouth dryness and nose dryness.   Eyes: Negative for pain, redness, itching, visual disturbance and dryness.  Respiratory: Negative for cough, shortness of breath, apnea and difficulty breathing.   Cardiovascular: Negative for chest pain, palpitations, hypertension, irregular heartbeat and swelling in legs/feet.  Gastrointestinal: Negative for blood in stool, constipation and diarrhea.  Endocrine: Negative for increased urination.  Genitourinary: Negative for difficulty urinating and vaginal dryness.  Musculoskeletal: Positive for arthralgias, joint pain, myalgias, morning stiffness, muscle tenderness and myalgias. Negative for joint swelling and muscle  weakness.  Skin: Negative for color change, rash, hair loss, redness, skin tightness, ulcers and sensitivity to sunlight.  Allergic/Immunologic: Negative for susceptible to infections.  Neurological: Negative for dizziness, numbness, headaches, memory loss, night sweats and weakness.  Hematological: Negative for bruising/bleeding tendency and swollen glands.  Psychiatric/Behavioral: Negative for depressed mood, confusion and sleep disturbance. The patient is not nervous/anxious.     PMFS History:  Patient Active Problem List   Diagnosis Date Noted  . Neuropathy 12/15/2019  . Allergic rhinitis 07/05/2019  . Brain atrophy ([place]) 05/18/2019  . Bilateral hearing loss 08/15/2018  . Fatigue 08/15/2018  . Chest tightness 06/02/2018  . Prediabetes 06/02/2018  . Arthralgia 06/02/2018  . Congestion of both ears 06/02/2018  . BPPV (benign paroxysmal positional vertigo), right 03/11/2018  . Dysfunction of right eustachian tube 03/11/2018  . Malignant neoplasm of upper-outer quadrant of left breast in female, estrogen receptor positive ([place]) 09/14/2017  . Radiculopathy 08/24/2017  . Fatty liver 08/24/2017  . Abdominal pain 08/24/2017  . Hearing difficulty of both ears 06/25/2017  . Chronic sinusitis 11/05/2016  . Bilateral sensorineural hearing loss 10/13/2016  . Right ear impacted cerumen 10/13/2016  . GERD (gastroesophageal reflux disease) 09/07/2016  . Cough 07/08/2016  . Exertional shortness of breath 01/15/2016  . Uterine prolapse 11/14/2015  . OSA (obstructive sleep apnea) 11/01/2015  . Back muscle spasm 04/30/2015  . DDD (degenerative disc disease), lumbar 04/30/2015  . Lumbar radiculitis 04/30/2015  . Low back pain 01/01/2015  . Varicose vein of leg 11/22/2014  . Post herpetic neuralgia 03/06/2014  . Increased frequency of urination 11/17/2013  . Rectocele 11/17/2013  .  Cystocele 10/25/2013  . Skin lesion 10/25/2013  . Disorder of skin and subcutaneous tissue 10/25/2013  .  Midline cystocele 10/25/2013  . Pelvic and perineal pain 10/25/2013  . Obesity, unspecified 10/19/2012  . History of breast cancer 02/11/2012  . Hyperlipidemia 05/11/2011  . Osteopenia 05/11/2011  . Disorder of bone and cartilage 05/11/2011  . Anxiety and depression 03/04/2011  . Hypertension 03/04/2011    Past Medical History:  Diagnosis Date  . Anxiety   . Arthritis   . Asthma   . Breast cancer ([place]) 2004   right breast  . Breast cancer ([place]) 09/08/2017   left breast  . Breast cancer of upper-outer quadrant of left female breast ([place]) 09/17/2017   T1c, N0, ER 90%, PR 90%, 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  . Cancer [facility]) 2004   right lumpectomy, chemotherapy and radiation Dr. Jeb Levering  . Depression   . GERD (gastroesophageal reflux disease)   . Hypertension   . Migraine   . Personal history of chemotherapy 2004  . Personal history of radiation therapy 2004  . Personal history of radiation therapy 2019  . PONV (postoperative nausea and vomiting)   . Shingles   . Sleep apnea    uses CPAP, severe OSA  . Vitamin D deficiency    IN THE PAST    Family History  Problem Relation Age of Onset  . Heart disease Mother   . Hyperlipidemia Mother   . Hypertension Mother   . Hypertension Father   . Osteoarthritis Father   . Stroke Father   . Alzheimer's disease Sister   . Alzheimer's disease Brother   . Brain cancer Brother   . Healthy Daughter   . Healthy Son   . Breast cancer Neg Hx    Past Surgical History:  Procedure Laterality Date  . ABDOMINAL HYSTERECTOMY    . ANTERIOR AND POSTERIOR REPAIR    . BREAST BIOPSY Right 2004   stereo. infiltrating ductal carcinoma  . BREAST BIOPSY Left 09/07/2017   US guided biopsy/positive- invasaive mammary carcinoma  . 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  3.5 cm maximum diameter, minimal margins 5 mm.  In situ component less than 10%.  Marland Kitchen BREAST LUMPECTOMY Right 2004    . BREAST LUMPECTOMY Left 09/17/2017   Procedure: BREAST EXCISION, SENTINEL NODE BIOPSY;  Surgeon: Robert Bellow, MD;  Location: [facility] ORS;  Service: General;  Laterality: Left;  . COLONOSCOPY  2015  . COLONOSCOPY WITH PROPOFOL N/A 10/04/2019   Procedure: COLONOSCOPY WITH PROPOFOL;  Surgeon: Robert Bellow, MD;  Location: [facility] ENDOSCOPY;  Service: Endoscopy;  Laterality: N/A;  . DILATION AND CURETTAGE OF UTERUS    . ETHMOIDECTOMY Bilateral 11/05/2016   Procedure: ETHMOIDECTOMY;  Surgeon: Melida Quitter, MD;  Location: [place];  Service: ENT;  Laterality: Bilateral;  . LAPAROTOMY    . MAXILLARY ANTROSTOMY Bilateral 11/05/2016   Procedure: MAXILLARY ANTROSTOMY;  Surgeon: Melida Quitter, MD;  Location: [place];  Service: ENT;  Laterality: Bilateral;  . SINUS ENDO W/FUSION Bilateral 11/05/2016   Procedure: FRONTAL RECESS EXPLORATION;  Surgeon: Melida Quitter, MD;  Location: [place];  Service: ENT;  Laterality: Bilateral;  BILATERAL ENDOSCOPIC SINUS SURGERY WITH FUSION  . SINUS EXPLORATION  11/05/2016  . SPHENOIDECTOMY Bilateral 11/05/2016   Procedure: SPHENOIDECTOMY;  Surgeon: Melida Quitter, MD;  Location[facility];  Service: ENT;  Laterality: Bilateral;   Social History   Social History Narrative   Daily Caffeine Use:  1-2 coffee  Regular Exercise -  NO   Immunization History  Administered Date(s) Administered  . Influenza Whole 06/04/2008  . Influenza, High Dose Seasonal PF 08/11/2017, 05/30/2018  . Influenza,inj,Quad PF,6+ Mos 03/06/2014  . Influenza-Unspecified 03/21/2012, 03/01/2013, 04/30/2015  . PFIZER SARS-COV-2 Vaccination 06/08/2019, 07/01/2019  . Pneumococcal Conjugate-13 10/25/2013  . Pneumococcal Polysaccharide-23 10/19/2012  . Td 06/01/2005     Objective: Vital Signs: BP 129/78 (BP Location: Left Arm, Patient Position: Sitting, Cuff Size: Normal)   Pulse 64   Resp 16   Ht 5' 4"  (1.626 m)   Wt 187 lb (84.8 kg)   BMI 32.10 kg/m    Physical Exam Vitals and nursing note  reviewed.  Constitutional:      Appearance: She is well-developed.  HENT:     Head: Normocephalic and atraumatic.  Eyes:     Conjunctiva/sclera: Conjunctivae normal.  Cardiovascular:     Rate and Rhythm: Normal rate and regular rhythm.     Heart sounds: Normal heart sounds.  Pulmonary:     Effort: Pulmonary effort is normal.     Breath sounds: Normal breath sounds.  Abdominal:     General: Bowel sounds are normal.     Palpations: Abdomen is soft.  Musculoskeletal:     Cervical back: Normal range of motion.  Lymphadenopathy:     Cervical: No cervical adenopathy.  Skin:    General: Skin is warm and dry.     Capillary Refill: Capillary refill takes less than 2 seconds.  Neurological:     Mental Status: She is alert and oriented to person, place, and time.  Psychiatric:        Behavior: Behavior normal.      Musculoskeletal Exam: C-spine was in good range of motion.  She has painful limited abduction of her right shoulder joint.  She has some tenderness over acromioclavicular joint.  Elbow joints, wrist joints with good range of motion.  She has bilateral PIP and DIP thickening with no synovitis.  Hip joints and knee joints with good range of motion with no warmth swelling or effusion.  There was no tenderness over ankle joints or MTPs. CDAI Exam: CDAI Score: -- Patient Global: --; Provider Global: -- Swollen: --; Tender: -- Joint Exam 12/18/2019   No joint exam has been documented for this visit   There is currently no information documented on the homunculus. Go to the Rheumatology activity and complete the homunculus joint exam.  Investigation: No additional findings.  Imaging: MR SHOULDER RIGHT WO CONTRAST  Result Date: 12/02/2019 CLINICAL DATA:  Chronic right shoulder pain since fall a year ago. EXAM: MRI OF THE RIGHT SHOULDER WITHOUT CONTRAST TECHNIQUE: Multiplanar, multisequence MR imaging of the shoulder was performed. No intravenous contrast was administered.  COMPARISON:  Right shoulder x-rays dated November 10, 2019. FINDINGS: Rotator cuff: Moderate supraspinatus and infraspinatus tendinosis with small intrasubstance tears at their insertions. The subscapularis and teres minor tendons are unremarkable. Muscles: No atrophy or abnormal signal of the muscles of the rotator cuff. Biceps long head:  Intact and normally positioned. Acromioclavicular Joint: Moderate arthropathy of the acromioclavicular joint. Type II acromion. Trace fluid in the subacromial/subdeltoid bursa. Glenohumeral Joint: No joint effusion. No chondral defect. Labrum: Grossly intact, but evaluation is limited by lack of intraarticular fluid. Bones: No acute fracture or dislocation. No suspicious bone lesion. Reactive subcortical cystic change in the greater tuberosity. Other: None. IMPRESSION: 1. Moderate supraspinatus and infraspinatus tendinosis with small intrasubstance tears at their insertions. No high-grade rotator cuff tear. 2. Moderate  acromioclavicular osteoarthritis. Electronically Signed   By: Titus Dubin M.D.   On: 12/02/2019 15:30   [place] Extrem Up Right Comp  Result Date: 11/22/2019 Ultrasound examination of right hand was performed per EULAR recommendations. Using 1 MHz transducer, grayscale and power Doppler right second MCP, right second third and fifth PIP and DIP joints and right wrist joint both dorsal and volar aspects were evaluated to look for synovitis or tenosynovitis.  DIP and PIP severe narrowing was noted.  The findings were there was no synovitis or tenosynovitis on ultrasound examination. Right median nerve was 0.10 cm squares which was within normal limits.  Impression: Ultrasound examination was consistent with osteoarthritis with DIP and PIP narrowing.  No synovitis was noted.  Right median nerve was within normal limits.  DG Bone Density  Result Date: 12/13/2019 EXAM: DUAL X-RAY ABSORPTIOMETRY (DXA) FOR BONE MINERAL DENSITY IMPRESSION: Your patient Channelle Bottger  completed a BMD test on 12/13/2019 using the [place] (software version: 14.10) manufactured by [employer]. The following summarizes the results of our evaluation. Technologist: [facility] PATIENT BIOGRAPHICAL: Name: Eulalie, Speights Patient ID: [id number] Birth Date: 12-18-1945 Height: 63.5 in. Gender: Female Exam Date: 12/13/2019 Weight: 182.4 lbs. Indications: Postmenopausal, History of Breast Cancer, Breast CA, History of Chemo, History of Radiation, Advanced Age, Previous Chemo and Radiation, Caucasian, High Risk Meds Fractures: Treatments: Calcium, Femara, Vitamin D DENSITOMETRY RESULTS: Site         Region     Measured Date Measured Age WHO Classification Young Adult T-score BMD         %Change vs. Previous Significant Change (*) DualFemur Neck Right 12/13/2019 72.3 Normal 0.3 1.080 g/cm2 2.9% - DualFemur Neck Right 01/26/2018 70.4 Normal 0.1 1.050 g/cm2 -3.8% - DualFemur Neck Right 05/06/2011 63.7 Normal 0.4 1.092 g/cm2 5.3% Yes DualFemur Neck Right 05/06/2011 63.7 Normal 0.0 1.037 g/cm2 - - DualFemur Total Mean 12/13/2019 72.3 Normal 1.7 1.223 g/cm2 0.5% - DualFemur Total Mean 01/26/2018 70.4 Normal 1.7 1.217 g/cm2 -0.6% - DualFemur Total Mean 05/06/2011 63.7 Normal 1.7 1.224 g/cm2 1.7% - DualFemur Total Mean 05/06/2011 63.7 Normal 1.6 1.203 g/cm2 - - Left Forearm Radius 33% 12/13/2019 72.3 Normal -0.6 0.827 g/cm2 -1.2% - Left Forearm Radius 33% 01/26/2018 70.4 Normal -0.4 0.837 g/cm2 5.9% Yes Left Forearm Radius 33% 05/06/2011 63.7 Normal -1.0 0.790 g/cm2 2.3% - Left Forearm Radius 33% 05/06/2011 63.7 Osteopenia -1.2 0.772 g/cm2 - - ASSESSMENT: The BMD measured at Forearm Radius 33% is 0.827 g/cm2 with a T-score of -0.6. This patient is considered normal according to [facility]) criteria. The scan quality is good. Lumbar spine was not utilized due to scoliosis. Compared with prior study, there has been no significant change in the total hip. World Social worker [facility]) criteria for post-menopausal, Caucasian Women: Normal:                   T-score at or above -1 SD Osteopenia/low bone mass: T-score between -1 and -2.5 SD Osteoporosis:             T-score at or below -2.5 SD RECOMMENDATIONS: 1. All patients should optimize calcium and vitamin D intake. 2. Consider FDA-approved medical therapies in postmenopausal women and men aged 75 years and older, based on the following: a. A hip or vertebral(clinical or morphometric) fracture b. T-score < -2.5 at the femoral neck or spine after appropriate evaluation to exclude secondary causes c. Low bone mass (T-score between -1.0 and -2.5 at the  femoral neck or spine) and a 10-year probability of a hip fracture > 3% or a 10-year probability of a major osteoporosis-related fracture > 20% based on the [place]-adapted WHO algorithm 3. Clinician judgment and/or patient preferences may indicate treatment for people with 10-year fracture probabilities above or below these levels FOLLOW-UP: People with diagnosed cases of osteoporosis or at high risk for fracture should have regular bone mineral density tests. For patients eligible for Medicare, routine testing is allowed once every 2 years. The testing frequency can be increased to one year for patients who have rapidly progressing disease, those who are receiving or discontinuing medical therapy to restore bone mass, or have additional risk factors. I have reviewed this report, and agree with the above findings. [facility] Electronically Signed   By: Lowella Grip III M.D.   On: 12/13/2019 14:10   MM DIAG BREAST TOMO BILATERAL  Result Date: 12/13/2019 CLINICAL DATA:  History of left breast cancer status post lumpectomy in 2019. History of right breast cancer status post lumpectomy in 2004. EXAM: DIGITAL DIAGNOSTIC BILATERAL MAMMOGRAM WITH TOMO AND CAD COMPARISON:  Previous exam(s). ACR Breast Density Category b: There are scattered areas of fibroglandular  density. FINDINGS: Stable lumpectomy changes are seen in each breast. No suspicious mass or malignant type microcalcifications identified in either breast. Mammographic images were processed with CAD. IMPRESSION: No evidence of malignancy in either breast. RECOMMENDATION: Bilateral diagnostic mammogram in 1 year is recommended. I have discussed the findings and recommendations with the patient. If applicable, a reminder letter will be sent to the patient regarding the next appointment. BI-RADS CATEGORY  2: Benign. Electronically Signed   By: Lillia Mountain M.D.   On: 12/13/2019 11:11    Recent Labs: Lab Results  Component Value Date   WBC 9.4 04/10/2019   HGB 13.7 04/10/2019   PLT 269 04/10/2019   NA 141 12/15/2019   K 3.7 12/15/2019   CL 99 12/15/2019   CO2 34 (H) 12/15/2019   GLUCOSE 106 (H) 12/15/2019   BUN 24 (H) 12/15/2019   CREATININE 0.83 12/15/2019   BILITOT 0.5 12/15/2019   ALKPHOS 59 12/15/2019   AST 15 12/15/2019   ALT 15 12/15/2019   PROT 7.2 12/15/2019   ALBUMIN 4.5 12/15/2019   CALCIUM 10.0 12/15/2019   GFRAA >60 04/10/2019  November 10, 2019 RF negative, anti-CCP negative, 14 3 3  eta negative, uric acid 5.2  Speciality Comments: No specialty comments available.  Procedures:  No procedures performed Allergies: Lidocaine, Codeine, Morphine, Niacin, and Niacin and related   Assessment / Plan:     Visit Diagnoses: Primary osteoarthritis of both hands - Severe osteoarthritis involving bilateral hands.  Second and third MCP narrowing with hanging osteophyte.  Ultrasound negative for synovitis.  All autoimmune work-up is negative.  Muscle strength exercises were discussed.  Primary osteoarthritis of both knees - Bilateral moderate osteoarthritis and bilateral moderate chondromalacia patella.  She continues to have some discomfort in her knee joints.  Arthritis of right acromioclavicular joint - MRI showed tendinopathy and mild rotator cuff tear.  She has limited range of motion  and ongoing discomfort.  We will refer her to orthopedics.  I will also refer to physical therapy.  A prescription for physical therapy was given.  DDD (degenerative disc disease), cervical-she has good lateral rotation with some discomfort.  DDD (degenerative disc disease), lumbar - Followed by Dr. Patrice Paradise.  Patient reports having 3 courses of prednisone for pain.  Essential hypertension-blood pressure is well controlled.  History of hyperlipidemia  History of gastroesophageal reflux (GERD)  Malignant neoplasm of upper-outer quadrant of left breast in female, estrogen receptor positive ([place]) - status post chemotherapy and radiation therapy.  Anxiety and depression  OSA (obstructive sleep apnea)  Fatty liver  Bilateral sensorineural hearing loss  Brain atrophy ([facility])  BPPV (benign paroxysmal positional vertigo), right  Post herpetic neuralgia  Vitamin D deficiency  Orders: Orders Placed This Encounter  Procedures  . AMB referral to orthopedics   No orders of the defined types were placed in this encounter.     Follow-Up Instructions: Return if symptoms worsen or fail to improve, for Osteoarthritis.   Bo Merino, MD  Note - This record has been created using Editor, commissioning.  Chart creation errors have been sought, but may not always  have been located. Such creation errors do not reflect on  the standard of medical care.

## 2019-12-13 ENCOUNTER — Ambulatory Visit
Admission: RE | Admit: 2019-12-13 | Discharge: 2019-12-13 | Disposition: A | Discharge status: Home / Self Care | Payer: PPO | Source: Ambulatory Visit | Attending: Oncology | Admitting: Oncology

## 2019-12-13 DIAGNOSIS — R928 Other abnormal and inconclusive findings on diagnostic imaging of breast: Secondary | ICD-10-CM | POA: Diagnosis not present

## 2019-12-13 DIAGNOSIS — Z780 Asymptomatic menopausal state: Secondary | ICD-10-CM | POA: Insufficient documentation

## 2019-12-13 DIAGNOSIS — Z853 Personal history of malignant neoplasm of breast: Secondary | ICD-10-CM | POA: Diagnosis not present

## 2019-12-13 DIAGNOSIS — Z08 Encounter for follow-up examination after completed treatment for malignant neoplasm: Secondary | ICD-10-CM | POA: Insufficient documentation

## 2019-12-13 DIAGNOSIS — Z13820 Encounter for screening for osteoporosis: Secondary | ICD-10-CM | POA: Insufficient documentation

## 2019-12-13 DIAGNOSIS — M8580 Other specified disorders of bone density and structure, unspecified site: Secondary | ICD-10-CM | POA: Diagnosis not present

## 2019-12-15 ENCOUNTER — Other Ambulatory Visit: Payer: Self-pay

## 2019-12-15 ENCOUNTER — Other Ambulatory Visit: Payer: Self-pay | Admitting: Internal Medicine

## 2019-12-15 ENCOUNTER — Other Ambulatory Visit: Payer: Self-pay | Admitting: Family Medicine

## 2019-12-15 ENCOUNTER — Ambulatory Visit (INDEPENDENT_AMBULATORY_CARE_PROVIDER_SITE_OTHER): Payer: PPO | Admitting: Family Medicine

## 2019-12-15 ENCOUNTER — Encounter: Payer: Self-pay | Admitting: Family Medicine

## 2019-12-15 VITALS — BP 130/70 | HR 79 | Temp 98.5°F | Ht 64.0 in | Wt 182.0 lb

## 2019-12-15 DIAGNOSIS — R7303 Prediabetes: Secondary | ICD-10-CM | POA: Diagnosis not present

## 2019-12-15 DIAGNOSIS — F419 Anxiety disorder, unspecified: Secondary | ICD-10-CM

## 2019-12-15 DIAGNOSIS — E785 Hyperlipidemia, unspecified: Secondary | ICD-10-CM

## 2019-12-15 DIAGNOSIS — Z6831 Body mass index (BMI) 31.0-31.9, adult: Secondary | ICD-10-CM | POA: Diagnosis not present

## 2019-12-15 DIAGNOSIS — F329 Major depressive disorder, single episode, unspecified: Secondary | ICD-10-CM | POA: Diagnosis not present

## 2019-12-15 DIAGNOSIS — I83813 Varicose veins of bilateral lower extremities with pain: Secondary | ICD-10-CM | POA: Diagnosis not present

## 2019-12-15 DIAGNOSIS — E6609 Other obesity due to excess calories: Secondary | ICD-10-CM

## 2019-12-15 DIAGNOSIS — G629 Polyneuropathy, unspecified: Secondary | ICD-10-CM

## 2019-12-15 DIAGNOSIS — F32A Depression, unspecified: Secondary | ICD-10-CM

## 2019-12-15 LAB — COMPREHENSIVE METABOLIC PANEL
ALT: 15 U/L (ref 0–35)
AST: 15 U/L (ref 0–37)
Albumin: 4.5 g/dL (ref 3.5–5.2)
Alkaline Phosphatase: 59 U/L (ref 39–117)
BUN: 24 mg/dL — ABNORMAL HIGH (ref 6–23)
CO2: 34 mEq/L — ABNORMAL HIGH (ref 19–32)
Calcium: 10 mg/dL (ref 8.4–10.5)
Chloride: 99 mEq/L (ref 96–112)
Creatinine, Ser: 0.83 mg/dL (ref 0.40–1.20)
GFR: 67.51 mL/min (ref >60.00)
Glucose, Bld: 106 mg/dL — ABNORMAL HIGH (ref 70–99)
Potassium: 3.7 mEq/L (ref 3.5–5.1)
Sodium: 141 mEq/L (ref 135–145)
Total Bilirubin: 0.5 mg/dL (ref 0.2–1.2)
Total Protein: 7.2 g/dL (ref 6.0–8.3)

## 2019-12-15 LAB — LIPID PANEL
Cholesterol: 268 mg/dL — ABNORMAL HIGH (ref 0–200)
HDL: 59.1 mg/dL (ref >39.00)
LDL Cholesterol: 187 mg/dL — ABNORMAL HIGH (ref 0–99)
NonHDL: 209.33
Total CHOL/HDL Ratio: 5
Triglycerides: 111 mg/dL (ref 0.0–149.0)
VLDL: 22.2 mg/dL (ref 0.0–40.0)

## 2019-12-15 LAB — HEMOGLOBIN A1C: Hgb A1c MFr Bld: 5.9 % (ref 4.6–6.5)

## 2019-12-15 LAB — VITAMIN B12: Vitamin B-12: 298 pg/mL (ref 211–911)

## 2019-12-15 MED ORDER — SERTRALINE HCL 100 MG PO TABS: 200.0000 mg | ORAL_TABLET | Freq: Every day | ORAL | 1 refills | Status: DC

## 2019-12-15 MED ORDER — TRAZODONE HCL 50 MG PO TABS: 25.0000 mg | ORAL_TABLET | Freq: Every evening | ORAL | 3 refills | Status: DC | PRN

## 2019-12-15 NOTE — Patient Instructions (Signed)
Nice to see you. I have increased her Zoloft to 200 mg once daily. We will add trazodone to see if that helps you sleep. If you are excessively drowsy with that please let us know. Please continue with your dietary changes. I have referred you to vascular surgery for your varicose veins. We will get lab work today.

## 2019-12-15 NOTE — Assessment & Plan Note (Signed)
Check lipid panel  

## 2019-12-15 NOTE — Assessment & Plan Note (Signed)
Check A1c. 

## 2019-12-15 NOTE — Assessment & Plan Note (Signed)
Congratulated on weight loss. We will continue to work on diet and exercise.

## 2019-12-15 NOTE — Assessment & Plan Note (Signed)
Refer to vascular surgery to get their input on treatment options.

## 2019-12-15 NOTE — Assessment & Plan Note (Addendum)
Continues to have issues with this. We will increase her Zoloft. Add trazodone to help sleep. Discussed possibly seeing a counselor or therapist and she will think about this. Suspect she is tense related to anxiety.

## 2019-12-15 NOTE — Progress Notes (Signed)
[name], MD Phone: [phone]  Dana Obrien is a 74 y.o. female who presents today for f/u.  Depression: Notes this did improve after the prior increase in dose of her Zoloft though has worsened over the last several weeks. No SI. Does have anxiety though she is unsure why. She notes it is hard for her to do the things she used to do and that makes her depressed. She also has a hard time sleeping at night related to this. She cannot exercise as well. She notes tension in her neck and shoulders. She does grind her teeth.  Obesity: She has lost about 20 pounds with low-carb no sugar diet. She not able to exercise given chronic pain issues.  Neuropathy: Patient has pins-and-needles in her feet and legs up to her knees most of the time. She also has varicose veins which bother her at times.  Arthritis: Patient has seen rheumatology. They did an MRI of her shoulder and did ultrasound of her hands. She follows up with them on Monday.  Social History   Tobacco Use  Smoking Status Never Smoker  Smokeless Tobacco Never Used     ROS see history of present illness  Objective  Physical Exam Vitals:   12/15/19 0956  BP: 130/70  Pulse: 79  Temp: 98.5 F (36.9 C)  SpO2: 98%    BP Readings from Last 3 Encounters:  12/15/19 130/70  11/10/19 118/60  10/04/19 (!) 157/67   Wt Readings from Last 3 Encounters:  12/15/19 182 lb (82.6 kg)  11/10/19 189 lb (85.7 kg)  10/04/19 192 lb (87.1 kg)    Physical Exam Constitutional:      General: She is not in acute distress.    Appearance: She is not diaphoretic.  Cardiovascular:     Rate and Rhythm: Normal rate and regular rhythm.     Heart sounds: Normal heart sounds.     Comments: 2+ DP and PT pulses bilaterally Pulmonary:     Effort: Pulmonary effort is normal.     Breath sounds: Normal breath sounds.  Musculoskeletal:     Comments: Varicose veins in bilateral lower legs  Skin:    General: Skin is warm and dry.    Neurological:     Mental Status: She is alert.     Comments: Sensation to light touch intact bilateral lower extremities      Assessment/Plan: Please see individual problem list.  Neuropathy Check B12 and A1c.   Varicose vein of leg Refer to vascular surgery to get their input on treatment options.  Prediabetes Check A1c.  Hyperlipidemia Check lipid panel.  Anxiety and depression Continues to have issues with this. We will increase her Zoloft. Add trazodone to help sleep. Discussed possibly seeing a counselor or therapist and she will think about this. Suspect she is tense related to anxiety.  Obesity, unspecified Congratulated on weight loss. We will continue to work on diet and exercise.   Orders Placed This Encounter  Procedures  . B12  . HgB A1c  . Lipid panel  . Comp Met (CMET)  . Ambulatory referral to Vascular Surgery    Referral Priority:   Routine    Referral Type:   Surgical    Referral Reason:   Specialty Services Required    Requested Specialty:   Vascular Surgery    Number of Visits Requested:   1    Meds ordered this encounter  Medications  . sertraline (ZOLOFT) 100 MG tablet    Sig: Take  2 tablets (200 mg total) by mouth daily.    Dispense:  180 tablet    Refill:  1  . traZODone (DESYREL) 50 MG tablet    Sig: Take 0.5-1 tablets (25-50 mg total) by mouth at bedtime as needed for sleep.    Dispense:  30 tablet    Refill:  3    This visit occurred during the SARS-CoV-2 public health emergency.  Safety protocols were in place, including screening questions prior to the visit, additional usage of staff PPE, and extensive cleaning of exam room while observing appropriate contact time as indicated for disinfecting solutions.    [name], MD [place]

## 2019-12-15 NOTE — Assessment & Plan Note (Signed)
Check B12 and A1c.

## 2019-12-18 ENCOUNTER — Other Ambulatory Visit: Payer: Self-pay

## 2019-12-18 ENCOUNTER — Encounter: Payer: Self-pay | Admitting: Rheumatology

## 2019-12-18 ENCOUNTER — Ambulatory Visit: Payer: PPO | Admitting: Rheumatology

## 2019-12-18 VITALS — BP 129/78 | HR 64 | Resp 16 | Ht 64.0 in | Wt 187.0 lb

## 2019-12-18 DIAGNOSIS — M19041 Primary osteoarthritis, right hand: Secondary | ICD-10-CM | POA: Diagnosis not present

## 2019-12-18 DIAGNOSIS — M19011 Primary osteoarthritis, right shoulder: Secondary | ICD-10-CM | POA: Diagnosis not present

## 2019-12-18 DIAGNOSIS — K760 Fatty (change of) liver, not elsewhere classified: Secondary | ICD-10-CM

## 2019-12-18 DIAGNOSIS — M5030 Other cervical disc degeneration, unspecified cervical region: Secondary | ICD-10-CM | POA: Diagnosis not present

## 2019-12-18 DIAGNOSIS — H903 Sensorineural hearing loss, bilateral: Secondary | ICD-10-CM

## 2019-12-18 DIAGNOSIS — M170 Bilateral primary osteoarthritis of knee: Secondary | ICD-10-CM | POA: Diagnosis not present

## 2019-12-18 DIAGNOSIS — Z8719 Personal history of other diseases of the digestive system: Secondary | ICD-10-CM

## 2019-12-18 DIAGNOSIS — Z8639 Personal history of other endocrine, nutritional and metabolic disease: Secondary | ICD-10-CM

## 2019-12-18 DIAGNOSIS — F419 Anxiety disorder, unspecified: Secondary | ICD-10-CM

## 2019-12-18 DIAGNOSIS — I10 Essential (primary) hypertension: Secondary | ICD-10-CM | POA: Diagnosis not present

## 2019-12-18 DIAGNOSIS — C50412 Malignant neoplasm of upper-outer quadrant of left female breast: Secondary | ICD-10-CM

## 2019-12-18 DIAGNOSIS — M51369 Other intervertebral disc degeneration, lumbar region without mention of lumbar back pain or lower extremity pain: Secondary | ICD-10-CM

## 2019-12-18 DIAGNOSIS — F329 Major depressive disorder, single episode, unspecified: Secondary | ICD-10-CM

## 2019-12-18 DIAGNOSIS — M5136 Other intervertebral disc degeneration, lumbar region: Secondary | ICD-10-CM

## 2019-12-18 DIAGNOSIS — G319 Degenerative disease of nervous system, unspecified: Secondary | ICD-10-CM

## 2019-12-18 DIAGNOSIS — B0229 Other postherpetic nervous system involvement: Secondary | ICD-10-CM

## 2019-12-18 DIAGNOSIS — G4733 Obstructive sleep apnea (adult) (pediatric): Secondary | ICD-10-CM | POA: Diagnosis not present

## 2019-12-18 DIAGNOSIS — E559 Vitamin D deficiency, unspecified: Secondary | ICD-10-CM

## 2019-12-18 DIAGNOSIS — M19042 Primary osteoarthritis, left hand: Secondary | ICD-10-CM

## 2019-12-18 DIAGNOSIS — Z170 Estrogen receptor positive status [ER+]: Secondary | ICD-10-CM

## 2019-12-18 DIAGNOSIS — F32A Depression, unspecified: Secondary | ICD-10-CM

## 2019-12-18 DIAGNOSIS — H8111 Benign paroxysmal vertigo, right ear: Secondary | ICD-10-CM

## 2019-12-18 NOTE — Patient Instructions (Addendum)
Shoulder Exercises Ask your health care provider which exercises are safe for you. Do exercises exactly as told by your health care provider and adjust them as directed. It is normal to feel mild stretching, pulling, tightness, or discomfort as you do these exercises. Stop right away if you feel sudden pain or your pain gets worse. Do not begin these exercises until told by your health care provider. Stretching exercises External rotation and abduction This exercise is sometimes called corner stretch. This exercise rotates your arm outward (external rotation) and moves your arm out from your body (abduction). 1. Stand in a doorway with one of your feet slightly in front of the other. This is called a staggered stance. If you cannot reach your forearms to the door frame, stand facing a corner of a room. 2. Choose one of the following positions as told by your health care provider: ? Place your hands and forearms on the door frame above your head. ? Place your hands and forearms on the door frame at the height of your head. ? Place your hands on the door frame at the height of your elbows. 3. Slowly move your weight onto your front foot until you feel a stretch across your chest and in the front of your shoulders. Keep your head and chest upright and keep your abdominal muscles tight. 4. Hold for __________ seconds. 5. To release the stretch, shift your weight to your back foot. Repeat __________ times. Complete this exercise __________ times a day. Extension, standing 1. Stand and hold a broomstick, a cane, or a similar object behind your back. ? Your hands should be a little wider than shoulder width apart. ? Your palms should face away from your back. 2. Keeping your elbows straight and your shoulder muscles relaxed, move the stick away from your body until you feel a stretch in your shoulders (extension). ? Avoid shrugging your shoulders while you move the stick. Keep your shoulder blades tucked  down toward the middle of your back. 3. Hold for __________ seconds. 4. Slowly return to the starting position. Repeat __________ times. Complete this exercise __________ times a day. Range-of-motion exercises Pendulum  1. Stand near a wall or a surface that you can hold onto for balance. 2. Bend at the waist and let your left / right arm hang straight down. Use your other arm to support you. Keep your back straight and do not lock your knees. 3. Relax your left / right arm and shoulder muscles, and move your hips and your trunk so your left / right arm swings freely. Your arm should swing because of the motion of your body, not because you are using your arm or shoulder muscles. 4. Keep moving your hips and trunk so your arm swings in the following directions, as told by your health care provider: ? Side to side. ? Forward and backward. ? In clockwise and counterclockwise circles. 5. Continue each motion for __________ seconds, or for as long as told by your health care provider. 6. Slowly return to the starting position. Repeat __________ times. Complete this exercise __________ times a day. Shoulder flexion, standing  1. Stand and hold a broomstick, a cane, or a similar object. Place your hands a little more than shoulder width apart on the object. Your left / right hand should be palm up, and your other hand should be palm down. 2. Keep your elbow straight and your shoulder muscles relaxed. Push the stick up with your healthy arm to   raise your left / right arm in front of your body, and then over your head until you feel a stretch in your shoulder (flexion). ? Avoid shrugging your shoulder while you raise your arm. Keep your shoulder blade tucked down toward the middle of your back. 3. Hold for __________ seconds. 4. Slowly return to the starting position. Repeat __________ times. Complete this exercise __________ times a day. Shoulder abduction, standing 1. Stand and hold a broomstick,  a cane, or a similar object. Place your hands a little more than shoulder width apart on the object. Your left / right hand should be palm up, and your other hand should be palm down. 2. Keep your elbow straight and your shoulder muscles relaxed. Push the object across your body toward your left / right side. Raise your left / right arm to the side of your body (abduction) until you feel a stretch in your shoulder. ? Do not raise your arm above shoulder height unless your health care provider tells you to do that. ? If directed, raise your arm over your head. ? Avoid shrugging your shoulder while you raise your arm. Keep your shoulder blade tucked down toward the middle of your back. 3. Hold for __________ seconds. 4. Slowly return to the starting position. Repeat __________ times. Complete this exercise __________ times a day. Internal rotation  1. Place your left / right hand behind your back, palm up. 2. Use your other hand to dangle an exercise band, a towel, or a similar object over your shoulder. Grasp the band with your left / right hand so you are holding on to both ends. 3. Gently pull up on the band until you feel a stretch in the front of your left / right shoulder. The movement of your arm toward the center of your body is called internal rotation. ? Avoid shrugging your shoulder while you raise your arm. Keep your shoulder blade tucked down toward the middle of your back. 4. Hold for __________ seconds. 5. Release the stretch by letting go of the band and lowering your hands. Repeat __________ times. Complete this exercise __________ times a day. Strengthening exercises External rotation  1. Sit in a stable chair without armrests. 2. Secure an exercise band to a stable object at elbow height on your left / right side. 3. Place a soft object, such as a folded towel or a small pillow, between your left / right upper arm and your body to move your elbow about 4 inches (10 cm) away  from your side. 4. Hold the end of the exercise band so it is tight and there is no slack. 5. Keeping your elbow pressed against the soft object, slowly move your forearm out, away from your abdomen (external rotation). Keep your body steady so only your forearm moves. 6. Hold for __________ seconds. 7. Slowly return to the starting position. Repeat __________ times. Complete this exercise __________ times a day. Shoulder abduction  1. Sit in a stable chair without armrests, or stand up. 2. Hold a __________ weight in your left / right hand, or hold an exercise band with both hands. 3. Start with your arms straight down and your left / right palm facing in, toward your body. 4. Slowly lift your left / right hand out to your side (abduction). Do not lift your hand above shoulder height unless your health care provider tells you that this is safe. ? Keep your arms straight. ? Avoid shrugging your shoulder while you   do this movement. Keep your shoulder blade tucked down toward the middle of your back. 5. Hold for __________ seconds. 6. Slowly lower your arm, and return to the starting position. Repeat __________ times. Complete this exercise __________ times a day. Shoulder extension 1. Sit in a stable chair without armrests, or stand up. 2. Secure an exercise band to a stable object in front of you so it is at shoulder height. 3. Hold one end of the exercise band in each hand. Your palms should face each other. 4. Straighten your elbows and lift your hands up to shoulder height. 5. Step back, away from the secured end of the exercise band, until the band is tight and there is no slack. 6. Squeeze your shoulder blades together as you pull your hands down to the sides of your thighs (extension). Stop when your hands are straight down by your sides. Do not let your hands go behind your body. 7. Hold for __________ seconds. 8. Slowly return to the starting position. Repeat __________ times.  Complete this exercise __________ times a day. Shoulder row 1. Sit in a stable chair without armrests, or stand up. 2. Secure an exercise band to a stable object in front of you so it is at waist height. 3. Hold one end of the exercise band in each hand. Position your palms so that your thumbs are facing the ceiling (neutral position). 4. Bend each of your elbows to a 90-degree angle (right angle) and keep your upper arms at your sides. 5. Step back until the band is tight and there is no slack. 6. Slowly pull your elbows back behind you. 7. Hold for __________ seconds. 8. Slowly return to the starting position. Repeat __________ times. Complete this exercise __________ times a day. Shoulder press-ups  1. Sit in a stable chair that has armrests. Sit upright, with your feet flat on the floor. 2. Put your hands on the armrests so your elbows are bent and your fingers are pointing forward. Your hands should be about even with the sides of your body. 3. Push down on the armrests and use your arms to lift yourself off the chair. Straighten your elbows and lift yourself up as much as you comfortably can. ? Move your shoulder blades down, and avoid letting your shoulders move up toward your ears. ? Keep your feet on the ground. As you get stronger, your feet should support less of your body weight as you lift yourself up. 4. Hold for __________ seconds. 5. Slowly lower yourself back into the chair. Repeat __________ times. Complete this exercise __________ times a day. Wall push-ups  1. Stand so you are facing a stable wall. Your feet should be about one arm-length away from the wall. 2. Lean forward and place your palms on the wall at shoulder height. 3. Keep your feet flat on the floor as you bend your elbows and lean forward toward the wall. 4. Hold for __________ seconds. 5. Straighten your elbows to push yourself back to the starting position. Repeat __________ times. Complete this exercise  __________ times a day. This information is not intended to replace advice given to you by your health care provider. Make sure you discuss any questions you have with your health care provider. Document Revised: 09/09/2018 Document Reviewed: 06/17/2018 [employer] Patient Education  2020 [employer] Hand Exercises Hand exercises can be helpful for almost anyone. These exercises can strengthen the hands, improve flexibility and movement, and increase blood flow to the hands.   These results can make work and daily tasks easier. Hand exercises can be especially helpful for people who have joint pain from arthritis or have nerve damage from overuse (carpal tunnel syndrome). These exercises can also help people who have injured a hand. Exercises Most of these hand exercises are gentle stretching and motion exercises. It is usually safe to do them often throughout the day. Warming up your hands before exercise may help to reduce stiffness. You can do this with gentle massage or by placing your hands in warm water for 10-15 minutes. It is normal to feel some stretching, pulling, tightness, or mild discomfort as you begin new exercises. This will gradually improve. Stop an exercise right away if you feel sudden, severe pain or your pain gets worse. Ask your health care provider which exercises are best for you. Knuckle bend or "claw" fist 1. Stand or sit with your arm, hand, and all five fingers pointed straight up. Make sure to keep your wrist straight during the exercise. 2. Gently bend your fingers down toward your palm until the tips of your fingers are touching the top of your palm. Keep your big knuckle straight and just bend the small knuckles in your fingers. 3. Hold this position for __________ seconds. 4. Straighten (extend) your fingers back to the starting position. Repeat this exercise 5-10 times with each hand. Full finger fist 1. Stand or sit with your arm, hand, and all five fingers pointed  straight up. Make sure to keep your wrist straight during the exercise. 2. Gently bend your fingers into your palm until the tips of your fingers are touching the middle of your palm. 3. Hold this position for __________ seconds. 4. Extend your fingers back to the starting position, stretching every joint fully. Repeat this exercise 5-10 times with each hand. Straight fist 1. Stand or sit with your arm, hand, and all five fingers pointed straight up. Make sure to keep your wrist straight during the exercise. 2. Gently bend your fingers at the big knuckle, where your fingers meet your hand, and the middle knuckle. Keep the knuckle at the tips of your fingers straight and try to touch the bottom of your palm. 3. Hold this position for __________ seconds. 4. Extend your fingers back to the starting position, stretching every joint fully. Repeat this exercise 5-10 times with each hand. Tabletop 1. Stand or sit with your arm, hand, and all five fingers pointed straight up. Make sure to keep your wrist straight during the exercise. 2. Gently bend your fingers at the big knuckle, where your fingers meet your hand, as far down as you can while keeping the small knuckles in your fingers straight. Think of forming a tabletop with your fingers. 3. Hold this position for __________ seconds. 4. Extend your fingers back to the starting position, stretching every joint fully. Repeat this exercise 5-10 times with each hand. Finger spread 1. Place your hand flat on a table with your palm facing down. Make sure your wrist stays straight as you do this exercise. 2. Spread your fingers and thumb apart from each other as far as you can until you feel a gentle stretch. Hold this position for __________ seconds. 3. Bring your fingers and thumb tight together again. Hold this position for __________ seconds. Repeat this exercise 5-10 times with each hand. Making circles 1. Stand or sit with your arm, hand, and all  five fingers pointed straight up. Make sure to keep your wrist straight during the   exercise. 2. Make a circle by touching the tip of your thumb to the tip of your index finger. 3. Hold for __________ seconds. Then open your hand wide. 4. Repeat this motion with your thumb and each finger on your hand. Repeat this exercise 5-10 times with each hand. Thumb motion 1. Sit with your forearm resting on a table and your wrist straight. Your thumb should be facing up toward the ceiling. Keep your fingers relaxed as you move your thumb. 2. Lift your thumb up as high as you can toward the ceiling. Hold for __________ seconds. 3. Bend your thumb across your palm as far as you can, reaching the tip of your thumb for the small finger (pinkie) side of your palm. Hold for __________ seconds. Repeat this exercise 5-10 times with each hand. Grip strengthening  1. Hold a stress ball or other soft ball in the middle of your hand. 2. Slowly increase the pressure, squeezing the ball as much as you can without causing pain. Think of bringing the tips of your fingers into the middle of your palm. All of your finger joints should bend when doing this exercise. 3. Hold your squeeze for __________ seconds, then relax. Repeat this exercise 5-10 times with each hand. Contact a health care provider if:  Your hand pain or discomfort gets much worse when you do an exercise.  Your hand pain or discomfort does not improve within 2 hours after you exercise. If you have any of these problems, stop doing these exercises right away. Do not do them again unless your health care provider says that you can. Get help right away if:  You develop sudden, severe hand pain or swelling. If this happens, stop doing these exercises right away. Do not do them again unless your health care provider says that you can. This information is not intended to replace advice given to you by your health care provider. Make sure you discuss any  questions you have with your health care provider. Document Revised: 09/08/2018 Document Reviewed: 05/19/2018 [employer] Patient Education  2020 [employer]  

## 2019-12-29 ENCOUNTER — Ambulatory Visit: Payer: PPO | Admitting: Orthopedic Surgery

## 2019-12-29 DIAGNOSIS — M25841 Other specified joint disorders, right hand: Secondary | ICD-10-CM | POA: Diagnosis not present

## 2019-12-29 DIAGNOSIS — M25512 Pain in left shoulder: Secondary | ICD-10-CM | POA: Diagnosis not present

## 2019-12-29 DIAGNOSIS — M67819 Other specified disorders of synovium and tendon, unspecified shoulder: Secondary | ICD-10-CM | POA: Diagnosis not present

## 2019-12-31 ENCOUNTER — Encounter: Payer: Self-pay | Admitting: Orthopedic Surgery

## 2019-12-31 DIAGNOSIS — M67819 Other specified disorders of synovium and tendon, unspecified shoulder: Secondary | ICD-10-CM | POA: Diagnosis not present

## 2019-12-31 DIAGNOSIS — M25512 Pain in left shoulder: Secondary | ICD-10-CM

## 2019-12-31 DIAGNOSIS — M25841 Other specified joint disorders, right hand: Secondary | ICD-10-CM | POA: Diagnosis not present

## 2019-12-31 MED ORDER — LIDOCAINE HCL 1 % IJ SOLN
5.0000 mL | INTRAMUSCULAR | Status: AC | PRN
  Administered 2019-12-31: 5 mL

## 2019-12-31 MED ORDER — METHYLPREDNISOLONE ACETATE 40 MG/ML IJ SUSP
40.0000 mg | INTRAMUSCULAR | Status: AC | PRN
  Administered 2019-12-31: 40 mg via INTRA_ARTICULAR

## 2019-12-31 MED ORDER — BUPIVACAINE HCL 0.5 % IJ SOLN
9.0000 mL | INTRAMUSCULAR | Status: AC | PRN
  Administered 2019-12-31: 9 mL via INTRA_ARTICULAR

## 2019-12-31 NOTE — Progress Notes (Signed)
Office Visit Note   Patient: Dana Obrien           Date of Birth: 04/08/1946           MRN: [id number] Visit Date: 12/29/2019 Requested by: Bo Merino, MD [address] [place],  [place] [address] PCP: Leone Haven, MD  Subjective: Chief Complaint  Patient presents with  . Right Shoulder - Pain    HPI: Dana Obrien is a 74 y.o. female who presents to the office complaining of right shoulder pain.  Patient complains of right shoulder pain since October 2020 when she fell from a porch and landed on her right arm.  She is right-hand dominant.  She notes constant throbbing pain and pain that wakes her up at night.  She had no issues with her right shoulder prior to the fall.  She states that pain is localized to the lateral aspect of the right shoulder and radiates down to her elbow.  She denies any numbness or tingling.  No history of right shoulder surgery.  She has been using Aspercreme and Voltaren as well as ice and heat with some relief.  She has never had a shoulder injection.  She has never had physical therapy for her shoulder.  She was seen by Dr. Estanislado Pandy who obtained an MRI of her right shoulder that revealed moderate supraspinatus and infraspinatus tendinosis with small intrasubstance tears at their insertions without high-grade rotator cuff tear as well as moderate AC joint osteoarthritis..  Patient also complains of cyst in her right hand on the palmar aspect of the hand between the third and fourth MCP joints.  She states that pain is worse with pressure on this point as well as with lifting objects with his hand because of the cyst.              ROS: All systems reviewed are negative as they relate to the chief complaint within the history of present illness.  Patient denies fevers or chills.  Assessment & Plan: Visit Diagnoses:  1. Tendinosis of rotator cuff   2. Cyst of joint of right hand     Plan: Patient is a 74 year old female  presents complaint of right shoulder pain.  She has had right shoulder pain since October 2020 when she fell off her porch.  MRI was obtained and findings are detailed above in the HPI.  Discussed options available patient.  She has not had a shoulder injection and she would like to try shoulder injection and therapy prior to considering any operative management.  I think this is reasonable.  Subacromial injection of cortisone was administered and patient tolerated the procedure well.  She already has a referral for physical therapy from Dr. Marjory Lies.  Plan to follow-up in 6 weeks for clinical recheck.  As for the right hand cyst, the cyst was injected with lidocaine and manipulated under ultrasound guidance.  Cyst was felt to be smaller in size after the manipulation.  We will monitor this at her follow-up visit as well.  Patient agreed with plan.  Follow-Up Instructions: No follow-ups on file.   Orders:  No orders of the defined types were placed in this encounter.  No orders of the defined types were placed in this encounter.     Procedures: Large Joint Inj: R subacromial bursa on 12/31/2019 7:27 PM Indications: diagnostic evaluation and pain Details: 18 G 1.5 in needle, posterior approach  Arthrogram: No  Medications: 9 mL bupivacaine 0.5 %;  40 mg methylPREDNISolone acetate 40 MG/ML; 5 mL lidocaine 1 % Outcome: tolerated well, no immediate complications Procedure, treatment alternatives, risks and benefits explained, specific risks discussed. Consent was given by the patient. Immediately prior to procedure a time out was called to verify the correct patient, procedure, equipment, support staff and site/side marked as required. Patient was prepped and draped in the usual sterile fashion.       Clinical Data: No additional findings.  Objective: Vital Signs: There were no vitals taken for this visit.  Physical Exam:  Constitutional: Patient appears well-developed HEENT:  Head:  Normocephalic Eyes:EOM are normal Neck: Normal range of motion Cardiovascular: Normal rate Pulmonary/chest: Effort normal Neurologic: Patient is alert Skin: Skin is warm Psychiatric: Patient has normal mood and affect  Ortho Exam: Ortho exam demonstrates right shoulder with 70 degrees external rotation, 75 degrees abduction, 160 degrees forward flexion.  This is equivalent to the contralateral side.  Mild tenderness palpation over the [facility] joint.  5/5 motor strength of the supraspinatus, infraspinatus, subscapularis.  Pain elicited in the right shoulder with infraspinatus and supraspinatus resistance testing.  No tenderness to palpation throughout the axial cervical spine.  Sensation intact through all dermatomes of the bilateral upper extremities.  Cyst identified at the palmar aspect of the hand between the third and fourth MCP joints.  Mild tenderness to palpation.  Cyst is mobile.  No skin changes overlying the cyst.  Specialty Comments:  No specialty comments available.  Imaging: No results found.   PMFS History: Patient Active Problem List   Diagnosis Date Noted  . Neuropathy 12/15/2019  . Allergic rhinitis 07/05/2019  . Brain atrophy ([place]) 05/18/2019  . Bilateral hearing loss 08/15/2018  . Fatigue 08/15/2018  . Chest tightness 06/02/2018  . Prediabetes 06/02/2018  . Arthralgia 06/02/2018  . Congestion of both ears 06/02/2018  . BPPV (benign paroxysmal positional vertigo), right 03/11/2018  . Dysfunction of right eustachian tube 03/11/2018  . Malignant neoplasm of upper-outer quadrant of left breast in female, estrogen receptor positive ([place]) 09/14/2017  . Radiculopathy 08/24/2017  . Fatty liver 08/24/2017  . Abdominal pain 08/24/2017  . Hearing difficulty of both ears 06/25/2017  . Chronic sinusitis 11/05/2016  . Bilateral sensorineural hearing loss 10/13/2016  . Right ear impacted cerumen 10/13/2016  . GERD (gastroesophageal reflux disease) 09/07/2016  . Cough  07/08/2016  . Exertional shortness of breath 01/15/2016  . Uterine prolapse 11/14/2015  . OSA (obstructive sleep apnea) 11/01/2015  . Back muscle spasm 04/30/2015  . DDD (degenerative disc disease), lumbar 04/30/2015  . Lumbar radiculitis 04/30/2015  . Low back pain 01/01/2015  . Varicose vein of leg 11/22/2014  . Post herpetic neuralgia 03/06/2014  . Increased frequency of urination 11/17/2013  . Rectocele 11/17/2013  . Cystocele 10/25/2013  . Skin lesion 10/25/2013  . Disorder of skin and subcutaneous tissue 10/25/2013  . Midline cystocele 10/25/2013  . Pelvic and perineal pain 10/25/2013  . Obesity, unspecified 10/19/2012  . History of breast cancer 02/11/2012  . Hyperlipidemia 05/11/2011  . Osteopenia 05/11/2011  . Disorder of bone and cartilage 05/11/2011  . Anxiety and depression 03/04/2011  . Hypertension 03/04/2011   Past Medical History:  Diagnosis Date  . Anxiety   . Arthritis   . Asthma   . Breast cancer ([place]) 2004   right breast  . Breast cancer ([place]) 09/08/2017   left breast  . Breast cancer of upper-outer quadrant of left female breast ([place]) 09/17/2017   T1c, N0, ER 90%, PR 90%, HER-2/neu not  overexpressed.  . Cancer [facility]) 2004   right lumpectomy, chemotherapy and radiation Dr. Jeb Levering  . Depression   . GERD (gastroesophageal reflux disease)   . Hypertension   . Migraine   . Personal history of chemotherapy 2004  . Personal history of radiation therapy 2004  . Personal history of radiation therapy 2019  . PONV (postoperative nausea and vomiting)   . Shingles   . Sleep apnea    uses CPAP, severe OSA  . Vitamin D deficiency    IN THE PAST    Family History  Problem Relation Age of Onset  . Heart disease Mother   . Hyperlipidemia Mother   . Hypertension Mother   . Hypertension Father   . Osteoarthritis Father   . Stroke Father   . Alzheimer's disease Sister   . Alzheimer's disease Brother   . Brain cancer Brother   . Healthy Daughter   .  Healthy Son   . Breast cancer Neg Hx     Past Surgical History:  Procedure Laterality Date  . ABDOMINAL HYSTERECTOMY    . ANTERIOR AND POSTERIOR REPAIR    . BREAST BIOPSY Right 2004   stereo. infiltrating ductal carcinoma  . BREAST BIOPSY Left 09/07/2017   US guided biopsy/positive- invasaive mammary carcinoma  . 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  3.5 cm maximum diameter, minimal margins 5 mm.  In situ component less than 10%.  Marland Kitchen BREAST LUMPECTOMY Right 2004  . BREAST LUMPECTOMY Left 09/17/2017   Procedure: BREAST EXCISION, SENTINEL NODE BIOPSY;  Surgeon: Robert Bellow, MD;  Location: [facility] ORS;  Service: General;  Laterality: Left;  . COLONOSCOPY  2015  . COLONOSCOPY WITH PROPOFOL N/A 10/04/2019   Procedure: COLONOSCOPY WITH PROPOFOL;  Surgeon: Robert Bellow, MD;  Location: [facility] END[facility]COPY;  Service: Endoscopy;  Laterality: N/A;  . DILATION AND CURETTAGE OF UTERUS    . ETHMOIDECTOMY Bilateral 11/05/2016   Procedure: ETHMOIDECTOMY;  Surgeon: Melida Quitter, MD;  Location: [place];  Service: ENT;  Laterality: Bilateral;  . LAPAROTOMY    . MAXILLARY ANTROSTOMY Bilateral 11/05/2016   Procedure: MAXILLARY ANTROSTOMY;  Surgeon: Melida Quitter, MD;  Location: [place];  Service: ENT;  Laterality: Bilateral;  . SINUS ENDO W/FUSION Bilateral 11/05/2016   Procedure: FRONTAL RECESS EXPLORATION;  Surgeon: Melida Quitter, MD;  Location: [place];  Service: ENT;  Laterality: Bilateral;  BILATERAL ENDOSCOPIC SINUS SURGERY WITH FUSION  . SINUS EXPLORATION  11/05/2016  . SPHENOIDECTOMY Bilateral 11/05/2016   Procedure: SPHENOIDECTOMY;  Surgeon: Melida Quitter, MD;  Location: [place];  Service: ENT;  Laterality: Bilateral;   Social History   Occupational History  . Not on file  Tobacco Use  . Smoking status: Never Smoker  . Smokeless tobacco: Never Used  Vaping Use  . Vaping Use: Never used  Substance and Sexual Activity  . Alcohol use:  Yes    Comment: rarely  . Drug use: No  . Sexual activity: Not Currently

## 2020-01-04 ENCOUNTER — Other Ambulatory Visit (INDEPENDENT_AMBULATORY_CARE_PROVIDER_SITE_OTHER): Payer: Self-pay | Admitting: Nurse Practitioner

## 2020-01-04 DIAGNOSIS — I83819 Varicose veins of unspecified lower extremities with pain: Secondary | ICD-10-CM

## 2020-01-05 ENCOUNTER — Ambulatory Visit (INDEPENDENT_AMBULATORY_CARE_PROVIDER_SITE_OTHER): Payer: PPO

## 2020-01-05 ENCOUNTER — Other Ambulatory Visit: Payer: Self-pay

## 2020-01-05 ENCOUNTER — Ambulatory Visit (INDEPENDENT_AMBULATORY_CARE_PROVIDER_SITE_OTHER): Payer: PPO | Admitting: Nurse Practitioner

## 2020-01-05 ENCOUNTER — Encounter (INDEPENDENT_AMBULATORY_CARE_PROVIDER_SITE_OTHER): Payer: Self-pay | Admitting: Nurse Practitioner

## 2020-01-05 VITALS — BP 120/74 | HR 76 | Resp 16 | Ht 64.0 in | Wt 183.4 lb

## 2020-01-05 DIAGNOSIS — I83819 Varicose veins of unspecified lower extremities with pain: Secondary | ICD-10-CM

## 2020-01-05 DIAGNOSIS — I10 Essential (primary) hypertension: Secondary | ICD-10-CM

## 2020-01-05 DIAGNOSIS — M5416 Radiculopathy, lumbar region: Secondary | ICD-10-CM

## 2020-01-05 DIAGNOSIS — I83812 Varicose veins of left lower extremities with pain: Secondary | ICD-10-CM

## 2020-01-05 NOTE — Progress Notes (Signed)
Subjective:    Patient ID: Dana Obrien, female    DOB: January 13, 1946, 74 y.o.   MRN: [id number] Chief Complaint  Patient presents with  . New Patient (Initial Visit)    ref Caryl Bis varicose veins w/pain    The patient is seen for evaluation of symptomatic varicose veins. The patient relates burning and stinging which worsened steadily throughout the course of the day, particularly with standing. The patient also notes an aching and throbbing pain over the varicosities, particularly with prolonged dependent positions. The symptoms are significantly improved with elevation.  The patient also notes that during hot weather the symptoms are greatly intensified. The patient states the pain from the varicose veins interferes with work, daily exercise, shopping and household maintenance. At this point, the symptoms are persistent and severe enough that they're having a negative impact on lifestyle and are interfering with daily activities.  The patient also complains of pain in her bilateral lower extremities.  This pain is described as an aching constant pain.  The pain does not necessarily change with ambulation however she notes that she is unable to stand for long periods without needing a stool to sit down.  The patient does have known degenerative joint disease as well as scoliosis and back pain.  There is no history of DVT, PE or superficial thrombophlebitis. There is no history of ulceration or hemorrhage. The patient denies a significant family history of varicose veins.   The patient has not worn graduated compression in the past. At the present time the patient has not been using over-the-counter analgesics. There is no history of prior surgical intervention or sclerotherapy.  Noninvasive symptoms today show reflux in the left common femoral vein.  There is also evidence of reflux in the great saphenous vein at the saphenofemoral junction extending to the mid thigh.  There is also a  area of reflux in the great saphenous vein at the proximal calf.  There is no evidence of DVT or superficial venous thrombosis bilaterally.  The right lower extremity shows no evidence of superficial venous reflux in the great or short saphenous veins.  There is also no evidence of deep venous insufficiency in the right lower extremity.   Review of Systems  Cardiovascular: Positive for leg swelling.  Musculoskeletal: Positive for arthralgias, back pain, gait problem and myalgias.  All other systems reviewed and are negative.      Objective:   Physical Exam Vitals reviewed.  HENT:     Head: Normocephalic.  Cardiovascular:     Rate and Rhythm: Normal rate.     Pulses: Normal pulses.  Pulmonary:     Effort: Pulmonary effort is normal.  Musculoskeletal:        General: Normal range of motion.     Right lower leg: Edema present.     Left lower leg: Edema present.  Skin:    General: Skin is warm and dry.  Neurological:     Mental Status: She is alert and oriented to person, place, and time.     Motor: Weakness present.  Psychiatric:        Mood and Affect: Mood normal.        Behavior: Behavior normal.        Thought Content: Thought content normal.        Judgment: Judgment normal.     BP 120/74 (BP Location: Right Arm)   Pulse 76   Resp 16   Ht 5' 4"  (1.626 m)   Wt  183 lb 6.4 oz (83.2 kg)   BMI 31.48 kg/m   Past Medical History:  Diagnosis Date  . Anxiety   . Arthritis   . Asthma   . Breast cancer ([place]) 2004   right breast  . Breast cancer ([place]) 09/08/2017   left breast  . Breast cancer of upper-outer quadrant of left female breast ([place]) 09/17/2017   T1c, N0, ER 90%, PR 90%, 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  . Cancer [facility]) 2004   right lumpectomy, chemotherapy and radiation Dr. Jeb Levering  . Depression   . GERD (gastroesophageal reflux disease)   . Hypertension   . Migraine   . Personal history of chemotherapy 2004  . Personal history of radiation therapy 2004  .  Personal history of radiation therapy 2019  . PONV (postoperative nausea and vomiting)   . Shingles   . Sleep apnea    uses CPAP, severe OSA  . Vitamin D deficiency    IN THE PAST    Social History   Socioeconomic History  . Marital status: Widowed    Spouse name: Not on file  . Number of children: Not on file  . Years of education: Not on file  . Highest education level: Not on file  Occupational History  . Not on file  Tobacco Use  . Smoking status: Never Smoker  . Smokeless tobacco: Never Used  Vaping Use  . Vaping Use: Never used  Substance and Sexual Activity  . Alcohol use: Yes    Comment: rarely  . Drug use: No  . Sexual activity: Not Currently  Other Topics Concern  . Not on file  Social History Narrative   Daily Caffeine Use:  1-2 coffee   Regular Exercise -  NO   Social Determinants of Health   Financial Resource Strain:   . Difficulty of Paying Living Expenses:   Food Insecurity:   . Worried About Charity fundraiser in the Last Year:   . Arboriculturist in the Last Year:   Transportation Needs: No Transportation Needs  . Lack of Transportation (Medical): No  . Lack of Transportation (Non-Medical): No  Physical Activity: Unknown  . Days of Exercise per Week: 0 days  . Minutes of Exercise per Session: Not on file  Stress:   . Feeling of Stress :   Social Connections: Unknown  . Frequency of Communication with Friends and Family: More than three times a week  . Frequency of Social Gatherings with Friends and Family: More than three times a week  . Attends Religious Services: Not on file  . Active Member of Clubs or Organizations: Not on file  . Attends Archivist Meetings: Not on file  . Marital Status: Widowed  Intimate Partner Violence: Not At Risk  . Fear of Current or Ex-Partner: No  . Emotionally Abused: No  . Physically Abused: No  . Sexually Abused: No    Past Surgical History:  Procedure Laterality Date  . ABDOMINAL  HYSTERECTOMY    . ANTERIOR AND POSTERIOR REPAIR    . BREAST BIOPSY Right 2004   stereo. infiltrating ductal carcinoma  . BREAST BIOPSY Left 09/07/2017   US guided biopsy/positive- invasaive mammary carcinoma  . 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  3.5 cm maximum diameter, minimal margins 5 mm.  In situ component less than 10%.  Marland Kitchen BREAST LUMPECTOMY Right 2004  . BREAST LUMPECTOMY Left 09/17/2017   Procedure: BREAST EXCISION, SENTINEL  NODE BIOPSY;  Surgeon: Robert Bellow, MD;  Location: [facility] ORS;  Service: General;  Laterality: Left;  . COLONOSCOPY  2015  . COLONOSCOPY WITH PROPOFOL N/A 10/04/2019   Procedure: COLONOSCOPY WITH PROPOFOL;  Surgeon: Robert Bellow, MD;  Location: [facility] END[facility];  Service: Endoscopy;  Laterality: N/A;  . DILATION AND CURETTAGE OF UTERUS    . ETHMOIDECTOMY Bilateral 11/05/2016   Procedure: ETHMOIDECTOMY;  Surgeon: Melida Quitter, MD;  Location: [place];  Service: ENT;  Laterality: Bilateral;  . LAPAROTOMY    . MAXILLARY ANTROSTOMY Bilateral 11/05/2016   Procedure: MAXILLARY ANTROSTOMY;  Surgeon: Melida Quitter, MD;  Location: [place];  Service: ENT;  Laterality: Bilateral;  . SINUS ENDO W/FUSION Bilateral 11/05/2016   Procedure: FRONTAL RECESS EXPLORATION;  Surgeon: Melida Quitter, MD;  Location: [place];  Service: ENT;  Laterality: Bilateral;  BILATERAL ENDOSCOPIC SINUS SURGERY WITH FUSION  . SINUS EXPLORATION  11/05/2016  . SPHENOIDECTOMY Bilateral 11/05/2016   Procedure: SPHENOIDECTOMY;  Surgeon: Melida Quitter, MD;  Location[facility];  Service: ENT;  Laterality: Bilateral;    Family History  Problem Relation Age of Onset  . Heart disease Mother   . Hyperlipidemia Mother   . Hypertension Mother   . Hypertension Father   . Osteoarthritis Father   . Stroke Father   . Alzheimer's disease Sister   . Alzheimer's disease Brother   . Brain cancer Brother   . Healthy Daughter   . Healthy Son   .  Breast cancer Neg Hx     Allergies  Allergen Reactions  . Lidocaine Palpitations  . Codeine Nausea Only and Nausea And Vomiting  . Morphine Nausea Only  . Niacin Rash  . Niacin And Related Rash    ANTIHYPERLIPEMICS       Assessment & Plan:   1. Varicose veins of left lower extremity with pain Based upon the patient's noninvasive studies it is not entirely certain that this is fully related to varicose veins.  The patient has never worn compression stockings therefore we will recommend a course of conservative therapy determine if this helps relieve her pain.   Recommend:  The patient has large symptomatic varicose veins that are painful and associated with swelling.  I have had a long discussion with the patient regarding  varicose veins and why they cause symptoms.  Patient will begin wearing graduated compression stockings class 1 on a daily basis, beginning first thing in the morning and removing them in the evening. The patient is instructed specifically not to sleep in the stockings.    The patient  will also begin using over-the-counter analgesics such as Motrin 600 mg po TID to help control the symptoms.    In addition, behavioral modification including elevation during the day will be initiated.    Pending the results of these changes the  patient will be reevaluated in three months.   Further plans will be based on the ultrasound results and whether conservative therapies are successful at eliminating the pain and swelling.   2. Lumbar radiculitis The aching pain that the patient has is likely related to known lumbar issues.  This may also be contributing to her consistent leg pain as she has pain in both legs not just the left lower extremity where the venous reflux was seen.  Patient also has difficulty standing which too would be related to back issues versus varicose veins.  3. Essential hypertension Continue antihypertensive medications as already ordered, these  medications have been reviewed and there are  no changes at this time.    Current Outpatient Medications on File Prior to Visit  Medication Sig Dispense Refill  . EQ ALLERGY RELIEF, CETIRIZINE, 10 MG tablet Take 1 tablet by mouth once daily 90 tablet 0  . fluticasone (FLONASE) 50 MCG/ACT nasal spray Place 1 spray into both nostrils daily. 16 g 5  . hydrochlorothiazide (HYDRODIURIL) 25 MG tablet Take 1 tablet by mouth once daily 90 tablet 0  . hydrOXYzine (ATARAX/VISTARIL) 10 MG tablet Take 1 tablet (10 mg total) by mouth 3 (three) times daily as needed for itching. 90 tablet 1  . letrozole (FEMARA) 2.5 MG tablet Take 1 tablet by mouth once daily 90 tablet 0  . Multiple Vitamin (MULTI-VITAMIN) tablet Take by mouth.    . Omega-3 Fatty Acids (FISH OIL PO) Take by mouth daily.    . ondansetron (ZOFRAN ODT) 4 MG disintegrating tablet Allow 1-2 tablets to dissolve in your mouth every 8 hours as needed for nausea/vomiting 30 tablet 0  . pantoprazole (PROTONIX) 40 MG tablet Take 1 tablet (40 mg total) by mouth daily. 90 tablet 2  . rosuvastatin (CRESTOR) 20 MG tablet Take 1 tablet by mouth once daily 90 tablet 0  . senna (SENOKOT) 8.6 MG TABS tablet Take 1 tablet by mouth at bedtime.     . sertraline (ZOLOFT) 100 MG tablet Take 2 tablets (200 mg total) by mouth daily. 180 tablet 1  . SUMAtriptan (IMITREX) 20 MG/ACT nasal spray Place 1 spray (20 mg total) into the nose as needed for migraine or headache. May repeat in 2 hours if headache persists or recurs. 1 Inhaler 2  . traMADol (ULTRAM) 50 MG tablet Take 1 tablet (50 mg total) by mouth every 12 (twelve) hours as needed. 30 tablet 0  . traZODone (DESYREL) 50 MG tablet Take 0.5-1 tablets (25-50 mg total) by mouth at bedtime as needed for sleep. 30 tablet 3  . tretinoin (RETIN-A) 0.05 % cream Apply 1 application topically at bedtime. 45 g 0   No current facility-administered medications on file prior to visit.    There are no Patient Instructions on  file for this visit. No follow-ups on file.   Kris Hartmann, NP

## 2020-01-08 ENCOUNTER — Encounter (INDEPENDENT_AMBULATORY_CARE_PROVIDER_SITE_OTHER): Payer: Self-pay | Admitting: Nurse Practitioner

## 2020-01-10 ENCOUNTER — Other Ambulatory Visit: Payer: Self-pay | Admitting: Family Medicine

## 2020-01-10 MED ORDER — ROSUVASTATIN CALCIUM 40 MG PO TABS: 40.0000 mg | ORAL_TABLET | Freq: Every day | ORAL | 1 refills | Status: DC

## 2020-02-01 ENCOUNTER — Inpatient Hospital Stay: Payer: PPO | Admitting: Oncology

## 2020-02-09 ENCOUNTER — Ambulatory Visit: Payer: Self-pay

## 2020-02-09 ENCOUNTER — Ambulatory Visit: Payer: PPO | Admitting: Surgical

## 2020-02-09 DIAGNOSIS — M25511 Pain in right shoulder: Secondary | ICD-10-CM

## 2020-02-09 DIAGNOSIS — M25551 Pain in right hip: Secondary | ICD-10-CM | POA: Diagnosis not present

## 2020-02-09 DIAGNOSIS — M7501 Adhesive capsulitis of right shoulder: Secondary | ICD-10-CM

## 2020-02-09 DIAGNOSIS — M1711 Unilateral primary osteoarthritis, right knee: Secondary | ICD-10-CM | POA: Diagnosis not present

## 2020-02-23 ENCOUNTER — Other Ambulatory Visit: Payer: Self-pay

## 2020-02-23 ENCOUNTER — Ambulatory Visit (INDEPENDENT_AMBULATORY_CARE_PROVIDER_SITE_OTHER): Payer: PPO | Admitting: Family Medicine

## 2020-02-23 ENCOUNTER — Encounter: Payer: Self-pay | Admitting: Family Medicine

## 2020-02-23 VITALS — BP 120/80 | HR 68 | Temp 98.2°F | Ht 64.0 in | Wt 174.8 lb

## 2020-02-23 DIAGNOSIS — F32A Depression, unspecified: Secondary | ICD-10-CM

## 2020-02-23 DIAGNOSIS — Z23 Encounter for immunization: Secondary | ICD-10-CM | POA: Diagnosis not present

## 2020-02-23 DIAGNOSIS — E785 Hyperlipidemia, unspecified: Secondary | ICD-10-CM | POA: Diagnosis not present

## 2020-02-23 DIAGNOSIS — F419 Anxiety disorder, unspecified: Secondary | ICD-10-CM | POA: Diagnosis not present

## 2020-02-23 DIAGNOSIS — R5383 Other fatigue: Secondary | ICD-10-CM

## 2020-02-23 DIAGNOSIS — M2550 Pain in unspecified joint: Secondary | ICD-10-CM

## 2020-02-23 DIAGNOSIS — F329 Major depressive disorder, single episode, unspecified: Secondary | ICD-10-CM | POA: Diagnosis not present

## 2020-02-23 LAB — CBC
HCT: 39.2 % (ref 36.0–46.0)
Hemoglobin: 13.3 g/dL (ref 12.0–15.0)
MCHC: 33.9 g/dL (ref 30.0–36.0)
MCV: 91.7 fl (ref 78.0–100.0)
Platelets: 188 10*3/uL (ref 150.0–400.0)
RBC: 4.27 Mil/uL (ref 3.87–5.11)
RDW: 14.9 % (ref 11.5–15.5)
WBC: 7.3 10*3/uL (ref 4.0–10.5)

## 2020-02-23 LAB — COMPREHENSIVE METABOLIC PANEL
ALT: 18 U/L (ref 0–35)
AST: 19 U/L (ref 0–37)
Albumin: 4.4 g/dL (ref 3.5–5.2)
Alkaline Phosphatase: 55 U/L (ref 39–117)
BUN: 19 mg/dL (ref 6–23)
CO2: 32 mEq/L (ref 19–32)
Calcium: 10.1 mg/dL (ref 8.4–10.5)
Chloride: 100 mEq/L (ref 96–112)
Creatinine, Ser: 0.74 mg/dL (ref 0.40–1.20)
GFR: 77.03 mL/min (ref >60.00)
Glucose, Bld: 98 mg/dL (ref 70–99)
Potassium: 3.4 mEq/L — ABNORMAL LOW (ref 3.5–5.1)
Sodium: 142 mEq/L (ref 135–145)
Total Bilirubin: 0.5 mg/dL (ref 0.2–1.2)
Total Protein: 7.1 g/dL (ref 6.0–8.3)

## 2020-02-23 LAB — VITAMIN B12: Vitamin B-12: 411 pg/mL (ref 211–911)

## 2020-02-23 LAB — LDL CHOLESTEROL, DIRECT: Direct LDL: 84 mg/dL

## 2020-02-23 LAB — VITAMIN D 25 HYDROXY (VIT D DEFICIENCY, FRACTURES): VITD: 25.38 ng/mL — ABNORMAL LOW (ref 30.00–100.00)

## 2020-02-23 MED ORDER — FLUOXETINE HCL 20 MG PO TABS: 20.0000 mg | ORAL_TABLET | Freq: Every day | ORAL | 3 refills | Status: DC

## 2020-02-23 MED ORDER — TRAMADOL HCL 50 MG PO TABS: 50.0000 mg | ORAL_TABLET | Freq: Two times a day (BID) | ORAL | 0 refills | Status: DC | PRN

## 2020-02-23 MED ORDER — SERTRALINE HCL 100 MG PO TABS: ORAL_TABLET | ORAL | 0 refills | Status: DC

## 2020-02-23 NOTE — Assessment & Plan Note (Addendum)
We will have her decrease her Zoloft to 100 mg once daily for 7 days.  Then she will decrease to Zoloft 50 mg once daily for 7 days and at the same time start on Prozac 20 mg once daily.  Once she has completed the 7 days of Zoloft 50 mg she will discontinue Zoloft.  I will see her back in 2 months to see how she is doing on Prozac.  She will let us know if she has any worsening symptoms with this change.

## 2020-02-23 NOTE — Assessment & Plan Note (Signed)
Recheck lipid panel 

## 2020-02-23 NOTE — Patient Instructions (Addendum)
Nice to see you. We will check labs today. We will have you decrease your Zoloft to 100 mg once daily for 7 days.  Then you will decrease to Zoloft 50 mg once daily for 7 days and at the same time start on Prozac 20 mg once daily.  Once you have completed the 7 days of Zoloft 50 mg she will discontinue Zoloft.

## 2020-02-23 NOTE — Assessment & Plan Note (Signed)
Chronic issue.  She will continue to see rheumatology and orthopedics.  I refilled her tramadol to take 50 mg every 12 hours as needed for pain.

## 2020-02-23 NOTE — Progress Notes (Signed)
[name], MD Phone: [phone]  Dana Obrien is a 74 y.o. female who presents today for f/u.  Depression/anxiety: Patient notes her symptoms have improved some though she notes she feels numb to the world and does not have very many emotions in either direction.  She has been on Zoloft.  She takes trazodone.  1 tablet helps her fall asleep all night.  No SI or HI.  Previously was on Prozac and notes that was quite beneficial.  Decreased energy level: Patient notes her energy has gone down over the last few months since we increased her Zoloft.  She started on a multivitamin though has not noticed any difference.  Arthritis: Patient notes she has arthritis all over.  She does follow with rheumatology and orthopedics.  She does not take the tramadol very frequently.  She has gotten an injection in her right knee and 2 injections in her right shoulder since our last visit.  She notes the orthopedist advised her that her right shoulder was getting frozen and they referred her to physical therapy for this.  Social History   Tobacco Use  Smoking Status Never Smoker  Smokeless Tobacco Never Used     ROS see history of present illness  Objective  Physical Exam Vitals:   02/23/20 0942  BP: 120/80  Pulse: 68  Temp: 98.2 F (36.8 C)  SpO2: 93%    BP Readings from Last 3 Encounters:  02/23/20 120/80  01/05/20 120/74  12/18/19 129/78   Wt Readings from Last 3 Encounters:  02/23/20 174 lb 12.8 oz (79.3 kg)  01/05/20 183 lb 6.4 oz (83.2 kg)  12/18/19 187 lb (84.8 kg)    Physical Exam Constitutional:      General: She is not in acute distress.    Appearance: She is not diaphoretic.  Cardiovascular:     Rate and Rhythm: Normal rate and regular rhythm.     Heart sounds: Normal heart sounds.  Pulmonary:     Effort: Pulmonary effort is normal.     Breath sounds: Normal breath sounds.  Musculoskeletal:     Right lower leg: No edema.     Left lower leg: No edema.    Skin:    General: Skin is warm and dry.  Neurological:     Mental Status: She is alert.      Assessment/Plan: Please see individual problem list.  Anxiety and depression We will have her decrease her Zoloft to 100 mg once daily for 7 days.  Then she will decrease to Zoloft 50 mg once daily for 7 days and at the same time start on Prozac 20 mg once daily.  Once she has completed the 7 days of Zoloft 50 mg she will discontinue Zoloft.  I will see her back in 2 months to see how she is doing on Prozac.  She will let us know if she has any worsening symptoms with this change.  Arthralgia Chronic issue.  She will continue to see rheumatology and orthopedics.  I refilled her tramadol to take 50 mg every 12 hours as needed for pain.  Decreased energy I suspect the increased dose of Zoloft is playing a role in this though will obtain lab work as outlined below to evaluate for other potential causes.  We will see if this improves with transition from Zoloft to Prozac.  Hyperlipidemia Recheck lipid panel.   Orders Placed This Encounter  Procedures  . Flu Vaccine QUAD High Dose(Fluad)  . Direct LDL  .  Comp Met (CMET)  . B12  . Vitamin D (25 hydroxy)  . CBC    Meds ordered this encounter  Medications  . traMADol (ULTRAM) 50 MG tablet    Sig: Take 1 tablet (50 mg total) by mouth every 12 (twelve) hours as needed.    Dispense:  30 tablet    Refill:  0  . sertraline (ZOLOFT) 100 MG tablet    Sig: Take 1 tablet (100 mg total) by mouth daily for 7 days, THEN 0.5 tablets (50 mg total) daily for 7 days. Then discontinue.Marland Kitchen    Dispense:  11 tablet    Refill:  0  . FLUoxetine (PROZAC) 20 MG tablet    Sig: Take 1 tablet (20 mg total) by mouth daily.    Dispense:  30 tablet    Refill:  3    Harly was seen today for follow-up.  Diagnoses and all orders for this visit:  Decreased energy -     B12 -     Vitamin D (25 hydroxy) -     CBC  Anxiety and depression  Arthralgia,  unspecified joint -     traMADol (ULTRAM) 50 MG tablet; Take 1 tablet (50 mg total) by mouth every 12 (twelve) hours as needed.  Hyperlipidemia, unspecified hyperlipidemia type -     Direct LDL -     Comp Met (CMET)  Need for immunization against influenza -     Flu Vaccine QUAD High Dose(Fluad)  Other orders -     sertraline (ZOLOFT) 100 MG tablet; Take 1 tablet (100 mg total) by mouth daily for 7 days, THEN 0.5 tablets (50 mg total) daily for 7 days. Then discontinue.. -     FLUoxetine (PROZAC) 20 MG tablet; Take 1 tablet (20 mg total) by mouth daily.     This visit occurred during the SARS-CoV-2 public health emergency.  Safety protocols were in place, including screening questions prior to the visit, additional usage of staff PPE, and extensive cleaning of exam room while observing appropriate contact time as indicated for disinfecting solutions.    [name], MD [place]

## 2020-02-23 NOTE — Assessment & Plan Note (Signed)
I suspect the increased dose of Zoloft is playing a role in this though will obtain lab work as outlined below to evaluate for other potential causes.  We will see if this improves with transition from Zoloft to Prozac.

## 2020-02-27 ENCOUNTER — Telehealth: Payer: Self-pay | Admitting: Family Medicine

## 2020-02-27 ENCOUNTER — Telehealth (INDEPENDENT_AMBULATORY_CARE_PROVIDER_SITE_OTHER): Payer: PPO | Admitting: Nurse Practitioner

## 2020-02-27 ENCOUNTER — Encounter: Payer: Self-pay | Admitting: Nurse Practitioner

## 2020-02-27 ENCOUNTER — Other Ambulatory Visit: Payer: Self-pay

## 2020-02-27 VITALS — Ht 64.0 in | Wt 174.0 lb

## 2020-02-27 DIAGNOSIS — L255 Unspecified contact dermatitis due to plants, except food: Secondary | ICD-10-CM | POA: Diagnosis not present

## 2020-02-27 NOTE — Telephone Encounter (Signed)
I would recommend she complete the virtual visit.

## 2020-02-27 NOTE — Telephone Encounter (Signed)
I called and LVM for the patient to call back. Lyndzie Zentz,cma   

## 2020-02-27 NOTE — Telephone Encounter (Signed)
Pt has an appt at 2 with Dawson Bills, NP but would like to know if she could just get something called in for poison oak relief? Please advise

## 2020-02-27 NOTE — Progress Notes (Signed)
Virtual Visit via telephone  Note  This visit type was conducted due to national recommendations for restrictions regarding the COVID-19 pandemic (e.g. social distancing).  This format is felt to be most appropriate for this patient at this time.  All issues noted in this document were discussed and addressed.  No physical exam was performed (except for noted visual exam findings with Video Visits).   I connected with@ on 02/29/20 at  2:00 PM EDT by a  telephone and verified that I am speaking with the correct person using two identifiers. Location patient: not at home Location provider: work or home office Persons participating in the virtual visit: patient, provider  I discussed the limitations, risks, security and privacy concerns of performing an evaluation and management service by telephone and the availability of in person appointments. I also discussed with the patient that there may be a patient responsible charge related to this service. The patient expressed understanding and agreed to proceed.   Reason for visit: Rash that she suspects is poison oak.   HPI: This 74 year old patient reports that she developed a rash at the side of her neck-a few spots, chin, ankles-few spots, shoulder-1 spot on the right, arms-a few spots on both arms since Saturday.  There is no blistering, serous drainage, but there are few linear lesions.  She had been doing yard work and did come in Sport and exercise psychologist with poison oak/ivy.  Patient has had poison oak several times in the past and recognizes this as the same.  She did apply cortisone cream, and took Zyrtec.    ROS: See pertinent positives and negatives per HPI.  Past Medical History:  Diagnosis Date  . Anxiety   . Arthritis   . Asthma   . Breast cancer ([place]) 2004   right breast  . Breast cancer ([place]) 09/08/2017   left breast  . 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  . Cancer [facility]) 2004   right lumpectomy, chemotherapy and radiation Dr. Jeb Levering  . Depression   . GERD (gastroesophageal reflux disease)   . Hypertension   . Migraine   . Personal history of chemotherapy 2004  . Personal history of radiation therapy 2004  . Personal history of radiation therapy 2019  . PONV (postoperative nausea and vomiting)   . Shingles   . Sleep apnea    uses CPAP, severe OSA  . Vitamin D deficiency    IN THE PAST    Past Surgical History:  Procedure Laterality Date  . ABDOMINAL HYSTERECTOMY    . ANTERIOR AND POSTERIOR REPAIR    . BREAST BIOPSY Right 2004   stereo. infiltrating ductal carcinoma  . BREAST BIOPSY Left 09/07/2017   US guided biopsy/positive- invasaive mammary carcinoma  . 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  3.5 cm maximum diameter, minimal margins 5 mm.  In situ component less than 10%.  Marland Kitchen BREAST LUMPECTOMY Right 2004  . BREAST LUMPECTOMY Left 09/17/2017   Procedure: BREAST EXCISION, SENTINEL NODE BIOPSY;  Surgeon: Robert Bellow, MD;  Location: [facility] ORS;  Service: General;  Laterality: Left;  . COLONOSCOPY  2015  . COLONOSCOPY WITH PROPOFOL N/A 10/04/2019   Procedure: COLONOSCOPY WITH PROPOFOL;  Surgeon: Robert Bellow, MD;  Location: [facility] ENDOSCOPY;  Service: Endoscopy;  Laterality: N/A;  . DILATION AND CURETTAGE OF UTERUS    . ETHMOIDECTOMY Bilateral 11/05/2016  Procedure: ETHMOIDECTOMY;  Surgeon: Melida Quitter, MD;  Location: [place];  Service: ENT;  Laterality: Bilateral;  . LAPAROTOMY    . MAXILLARY ANTROSTOMY Bilateral 11/05/2016   Procedure: MAXILLARY ANTROSTOMY;  Surgeon: Melida Quitter, MD;  Location: [place];  Service: ENT;  Laterality: Bilateral;  . SINUS ENDO W/FUSION Bilateral 11/05/2016   Procedure: FRONTAL RECESS EXPLORATION;  Surgeon: Melida Quitter, MD;  Location: [place];  Service: ENT;  Laterality: Bilateral;  BILATERAL ENDOSCOPIC SINUS SURGERY  WITH FUSION  . SINUS EXPLORATION  11/05/2016  . SPHENOIDECTOMY Bilateral 11/05/2016   Procedure: SPHENOIDECTOMY;  Surgeon: Melida Quitter, MD;  Location: [facility];  Service: ENT;  Laterality: Bilateral;    Family History  Problem Relation Age of Onset  . Heart disease Mother   . Hyperlipidemia Mother   . Hypertension Mother   . Hypertension Father   . Osteoarthritis Father   . Stroke Father   . Alzheimer's disease Sister   . Alzheimer's disease Brother   . Brain cancer Brother   . Healthy Daughter   . Healthy Son   . Breast cancer Neg Hx     SOCIAL HX: Non smoker   Current Outpatient Medications:  .  EQ ALLERGY RELIEF, CETIRIZINE, 10 MG tablet, Take 1 tablet by mouth once daily, Disp: 90 tablet, Rfl: 0 .  FLUoxetine (PROZAC) 20 MG tablet, Take 1 tablet (20 mg total) by mouth daily., Disp: 30 tablet, Rfl: 3 .  fluticasone (FLONASE) 50 MCG/ACT nasal spray, Place 1 spray into both nostrils daily., Disp: 16 g, Rfl: 5 .  hydrochlorothiazide (HYDRODIURIL) 25 MG tablet, Take 1 tablet by mouth once daily, Disp: 90 tablet, Rfl: 0 .  hydrOXYzine (ATARAX/VISTARIL) 10 MG tablet, Take 1 tablet (10 mg total) by mouth 3 (three) times daily as needed for itching., Disp: 90 tablet, Rfl: 1 .  letrozole (FEMARA) 2.5 MG tablet, Take 1 tablet by mouth once daily, Disp: 90 tablet, Rfl: 0 .  Multiple Vitamin (MULTI-VITAMIN) tablet, Take by mouth., Disp: , Rfl:  .  Omega-3 Fatty Acids (FISH OIL PO), Take by mouth daily., Disp: , Rfl:  .  ondansetron (ZOFRAN ODT) 4 MG disintegrating tablet, Allow 1-2 tablets to dissolve in your mouth every 8 hours as needed for nausea/vomiting, Disp: 30 tablet, Rfl: 0 .  pantoprazole (PROTONIX) 40 MG tablet, Take 1 tablet (40 mg total) by mouth daily., Disp: 90 tablet, Rfl: 2 .  rosuvastatin (CRESTOR) 40 MG tablet, Take 1 tablet (40 mg total) by mouth daily., Disp: 90 tablet, Rfl: 1 .  senna (SENOKOT) 8.6 MG TABS tablet, Take 1 tablet by mouth at bedtime. , Disp: , Rfl:  .   sertraline (ZOLOFT) 100 MG tablet, Take 1 tablet (100 mg total) by mouth daily for 7 days, THEN 0.5 tablets (50 mg total) daily for 7 days. Then discontinue.., Disp: 11 tablet, Rfl: 0 .  SUMAtriptan (IMITREX) 20 MG/ACT nasal spray, Place 1 spray (20 mg total) into the nose as needed for migraine or headache. May repeat in 2 hours if headache persists or recurs., Disp: 1 Inhaler, Rfl: 2 .  traMADol (ULTRAM) 50 MG tablet, Take 1 tablet (50 mg total) by mouth every 12 (twelve) hours as needed., Disp: 30 tablet, Rfl: 0 .  traZODone (DESYREL) 50 MG tablet, Take 0.5-1 tablets (25-50 mg total) by mouth at bedtime as needed for sleep., Disp: 30 tablet, Rfl: 3 .  tretinoin (RETIN-A) 0.05 % cream, Apply 1 application topically at bedtime., Disp: 45 g, Rfl: 0  EXAM:  VITALS per patient if applicable: Wt 174  GENERAL: Sounds alert, oriented  LUNGS: No signs of respiratory distress, breathing rate appears normal, no obvious gross SOB, gasping or wheezing PSYCH/NEURO: pleasant and cooperative, no obvious depression or anxiety, speech and thought processing grossly intact  ASSESSMENT AND PLAN:  Discussed the following assessment and plan:  Dermatitis due to plants, including poison ivy, sumac, and oak  No problem-specific Assessment & Plan notes found for this encounter.   Patient is confident that she has had exposure to poison oak or poison ivy and that she has had similar rash in the past.  We discussed the severity of the rash since this was a telephone call.  It sounds like we may be able to treat this with triamcinolone cream.  Patient given prescription for triamcinolone 0.1% cream to apply twice daily for 7 days. Patient to call back if there is no improvement she will need to have in person evaluation for her rash.  She does not have Internet video capability to have a virtual visit.  I discussed the assessment and treatment plan with the patient. The patient was provided an opportunity to ask  questions and all were answered. The patient agreed with the plan and demonstrated an understanding of the instructions.   The patient was advised to call back or seek an in-person evaluation if the symptoms worsen or if the condition fails to improve as anticipated.  I provided 7 minutes of non-face-to-face time during this encounter. Denice Paradise, NP Adult Nurse Practitioner [place] [phone]

## 2020-02-28 ENCOUNTER — Encounter: Payer: Self-pay | Admitting: Surgical

## 2020-02-28 DIAGNOSIS — M25551 Pain in right hip: Secondary | ICD-10-CM

## 2020-02-28 DIAGNOSIS — M7501 Adhesive capsulitis of right shoulder: Secondary | ICD-10-CM

## 2020-02-28 DIAGNOSIS — M25511 Pain in right shoulder: Secondary | ICD-10-CM

## 2020-02-28 DIAGNOSIS — M1711 Unilateral primary osteoarthritis, right knee: Secondary | ICD-10-CM | POA: Diagnosis not present

## 2020-02-28 MED ORDER — METHYLPREDNISOLONE ACETATE 40 MG/ML IJ SUSP
40.0000 mg | INTRAMUSCULAR | Status: AC | PRN
  Administered 2020-02-28: 40 mg via INTRA_ARTICULAR

## 2020-02-28 MED ORDER — BUPIVACAINE HCL 0.5 % IJ SOLN
9.0000 mL | INTRAMUSCULAR | Status: AC | PRN
  Administered 2020-02-28: 9 mL via INTRA_ARTICULAR

## 2020-02-28 MED ORDER — LIDOCAINE HCL 1 % IJ SOLN
5.0000 mL | INTRAMUSCULAR | Status: AC | PRN
  Administered 2020-02-28: 5 mL

## 2020-02-28 MED ORDER — BUPIVACAINE HCL 0.25 % IJ SOLN
4.0000 mL | INTRAMUSCULAR | Status: AC | PRN
  Administered 2020-02-28: 4 mL via INTRA_ARTICULAR

## 2020-02-28 NOTE — Progress Notes (Signed)
Office Visit Note   Patient: Dana Obrien           Date of Birth: Feb 13, 1946           MRN: [id number] Visit Date: 02/09/2020 Requested by: Leone Haven, MD [address] [place],  [place] [address] PCP: Leone Haven, MD  Subjective: Chief Complaint  Patient presents with  . Right Knee - Pain  . Follow-up    s/p shoulder injection    HPI: Dana Obrien is a 74 y.o. female who presents to the office complaining of right shoulder and right knee pain.  Patient had an injection in her shoulder and 12/29/2019 that provided 50% relief.  She still notes continued pain.  Pain from the shoulder is waking her up at night.  She has decreased range of motion of the shoulder.  Denies any history of diabetes.  No significant subjective weakness of the shoulder.  She has not started physical therapy yet.  Additionally she notes right knee pain without any injury.  Pain in her right knee wakes her up at night.  She has had treatment by Dr. Estanislado Pandy prior to this instance.  She "has a history of arthritis".  She notes diffuse right knee pain.  Denies any history of recent injections or surgery.  Denies any groin pain, numbness/tingling, radicular pain..                ROS: All systems reviewed are negative as they relate to the chief complaint within the history of present illness.  Patient denies fevers or chills.  Assessment & Plan: Visit Diagnoses:  1. Right shoulder pain, unspecified chronicity   2. Pain in right hip     Plan: Patient is a 74 year old female presents complaining of right shoulder and right knee pain.  She has had good relief from previous shoulder injection on 12/29/2019.  She does seem to have decreased range of motion compared with the contralateral side.  Impression is adhesive capsulitis of the right shoulder.  Continue with plan to begin physical therapy.  Another injection was administered today.  Follow-up in 6 weeks for clinical  recheck.  Additionally she has new complaint of right knee pain.  No history of surgery to the right knee.  This pain is waking her up at night.  Right knee radiographs from June of this year were reviewed.  She states she has not had any recent injections.  She would like to try an injection today.  Right knee injection of cortisone was administered and patient tolerated the procedure well.  Follow-up in 6 weeks.  Follow-Up Instructions: No follow-ups on file.   Orders:  Orders Placed This Encounter  Procedures  . XR HIP UNILAT W OR W/O PELVIS 2-3 VIEWS RIGHT  . Ambulatory referral to Physical Therapy   No orders of the defined types were placed in this encounter.     Procedures: Large Joint Inj: R knee on 02/28/2020 2:27 PM Indications: diagnostic evaluation, joint swelling and pain Details: 18 G 1.5 in needle, superolateral approach  Arthrogram: No  Medications: 5 mL lidocaine 1 %; 40 mg methylPREDNISolone acetate 40 MG/ML; 4 mL bupivacaine 0.25 % Outcome: tolerated well, no immediate complications Procedure, treatment alternatives, risks and benefits explained, specific risks discussed. Consent was given by the patient. Immediately prior to procedure a time out was called to verify the correct patient, procedure, equipment, support staff and site/side marked as required. Patient was prepped and draped in the  usual sterile fashion.   Large Joint Inj: R glenohumeral on 02/28/2020 2:28 PM Indications: diagnostic evaluation and pain Details: 18 G 1.5 in needle, posterior approach  Arthrogram: No  Medications: 9 mL bupivacaine 0.5 %; 40 mg methylPREDNISolone acetate 40 MG/ML; 5 mL lidocaine 1 % Outcome: tolerated well, no immediate complications Procedure, treatment alternatives, risks and benefits explained, specific risks discussed. Consent was given by the patient. Immediately prior to procedure a time out was called to verify the correct patient, procedure, equipment, support  staff and site/side marked as required. Patient was prepped and draped in the usual sterile fashion.       Clinical Data: No additional findings.  Objective: Vital Signs: There were no vitals taken for this visit.  Physical Exam:  Constitutional: Patient appears well-developed HEENT:  Head: Normocephalic Eyes:EOM are normal Neck: Normal range of motion Cardiovascular: Normal rate Pulmonary/chest: Effort normal Neurologic: Patient is alert Skin: Skin is warm Psychiatric: Patient has normal mood and affect  Ortho Exam: Ortho exam demonstrates right shoulder with 65 degrees external rotation, 90 degrees abduction, 130 degrees forward flexion.  This is compared with 130 degrees abduction of the left shoulder and 170 degrees forward flexion of the left shoulder.  5/5 motor strength with bilateral grip strength, finger abduction, pronation/supination, bicep, tricep, deltoid.  Excellent strength with supraspinatus, infraspinatus, subscapularis.  No tenderness over the [facility] joint.  No significant crepitus felt with passive range of motion of the right shoulder.  Right knee exam demonstrates no significant effusion.  Tenderness over the medial lateral joint lines.  Extensor mechanism intact, able to perform multiple straight leg raises.  No pain with hip range of motion.  No groin pain elicited on exam.  Negative straight leg raise.  Specialty Comments:  No specialty comments available.  Imaging: No results found.   PMFS History: Patient Active Problem List   Diagnosis Date Noted  . Neuropathy 12/15/2019  . Allergic rhinitis 07/05/2019  . Brain atrophy ([place]) 05/18/2019  . Bilateral hearing loss 08/15/2018  . Decreased energy 08/15/2018  . Chest tightness 06/02/2018  . Prediabetes 06/02/2018  . Arthralgia 06/02/2018  . Congestion of both ears 06/02/2018  . BPPV (benign paroxysmal positional vertigo), right 03/11/2018  . Dysfunction of right eustachian tube 03/11/2018  . Malignant  neoplasm of upper-outer quadrant of left breast in female, estrogen receptor positive ([place]) 09/14/2017  . Radiculopathy 08/24/2017  . Fatty liver 08/24/2017  . Abdominal pain 08/24/2017  . Hearing difficulty of both ears 06/25/2017  . Chronic sinusitis 11/05/2016  . Bilateral sensorineural hearing loss 10/13/2016  . Right ear impacted cerumen 10/13/2016  . GERD (gastroesophageal reflux disease) 09/07/2016  . Cough 07/08/2016  . Exertional shortness of breath 01/15/2016  . Uterine prolapse 11/14/2015  . OSA (obstructive sleep apnea) 11/01/2015  . Back muscle spasm 04/30/2015  . DDD (degenerative disc disease), lumbar 04/30/2015  . Lumbar radiculitis 04/30/2015  . Low back pain 01/01/2015  . Varicose vein of leg 11/22/2014  . Post herpetic neuralgia 03/06/2014  . Increased frequency of urination 11/17/2013  . Rectocele 11/17/2013  . Cystocele 10/25/2013  . Skin lesion 10/25/2013  . Disorder of skin and subcutaneous tissue 10/25/2013  . Midline cystocele 10/25/2013  . Pelvic and perineal pain 10/25/2013  . Obesity, unspecified 10/19/2012  . History of breast cancer 02/11/2012  . Hyperlipidemia 05/11/2011  . Osteopenia 05/11/2011  . Disorder of bone and cartilage 05/11/2011  . Anxiety and depression 03/04/2011  . Hypertension 03/04/2011   Past Medical History:  Diagnosis  Date  . Anxiety   . Arthritis   . Asthma   . Breast cancer ([place]) 2004   right breast  . Breast cancer ([place]) 09/08/2017   left breast  . 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  . Cancer [facility]) 2004   right lumpectomy, chemotherapy and radiation Dr. Jeb Levering  . Depression   . GERD (gastroesophageal reflux disease)   . Hypertension   . Migraine   . Personal history of chemotherapy 2004  . Personal history of radiation therapy 2004  . Personal history of radiation therapy 2019  . PONV (postoperative nausea and  vomiting)   . Shingles   . Sleep apnea    uses CPAP, severe OSA  . Vitamin D deficiency    IN THE PAST    Family History  Problem Relation Age of Onset  . Heart disease Mother   . Hyperlipidemia Mother   . Hypertension Mother   . Hypertension Father   . Osteoarthritis Father   . Stroke Father   . Alzheimer's disease Sister   . Alzheimer's disease Brother   . Brain cancer Brother   . Healthy Daughter   . Healthy Son   . Breast cancer Neg Hx     Past Surgical History:  Procedure Laterality Date  . ABDOMINAL HYSTERECTOMY    . ANTERIOR AND POSTERIOR REPAIR    . BREAST BIOPSY Right 2004   stereo. infiltrating ductal carcinoma  . BREAST BIOPSY Left 09/07/2017   US guided biopsy/positive- invasaive mammary carcinoma  . 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  3.5 cm maximum diameter, minimal margins 5 mm.  In situ component less than 10%.  Marland Kitchen BREAST LUMPECTOMY Right 2004  . BREAST LUMPECTOMY Left 09/17/2017   Procedure: BREAST EXCISION, SENTINEL NODE BIOPSY;  Surgeon: Robert Bellow, MD;  Location: [facility] ORS;  Service: General;  Laterality: Left;  . COLONOSCOPY  2015  . COLONOSCOPY WITH PROPOFOL N/A 10/04/2019   Procedure: COLONOSCOPY WITH PROPOFOL;  Surgeon: Robert Bellow, MD;  Location: [facility];  Service: Endoscopy;  Laterality: N/A;  . DILATION AND CURETTAGE OF UTERUS    . ETHMOIDECTOMY Bilateral 11/05/2016   Procedure: ETHMOIDECTOMY;  Surgeon: Melida Quitter, MD;  Location: [place];  Service: ENT;  Laterality: Bilateral;  . LAPAROTOMY    . MAXILLARY ANTROSTOMY Bilateral 11/05/2016   Procedure: MAXILLARY ANTROSTOMY;  Surgeon: Melida Quitter, MD;  Location: [place];  Service: ENT;  Laterality: Bilateral;  . SINUS ENDO W/FUSION Bilateral 11/05/2016   Procedure: FRONTAL RECESS EXPLORATION;  Surgeon: Melida Quitter, MD;  Location: [place];  Service: ENT;  Laterality: Bilateral;  BILATERAL ENDOSCOPIC SINUS SURGERY  WITH FUSION  . SINUS EXPLORATION  11/05/2016  . SPHENOIDECTOMY Bilateral 11/05/2016   Procedure: SPHENOIDECTOMY;  Surgeon: Melida Quitter, MD;  Location: [place];  Service: ENT;  Laterality: Bilateral;   Social History   Occupational History  . Not on file  Tobacco Use  . Smoking status: Never Smoker  . Smokeless tobacco: Never Used  Vaping Use  . Vaping Use: Never used  Substance and Sexual Activity  . Alcohol use: Yes    Comment: rarely  . Drug use: No  . Sexual activity: Not Currently

## 2020-02-29 ENCOUNTER — Encounter: Payer: Self-pay | Admitting: Nurse Practitioner

## 2020-02-29 DIAGNOSIS — L255 Unspecified contact dermatitis due to plants, except food: Secondary | ICD-10-CM | POA: Insufficient documentation

## 2020-02-29 NOTE — Patient Instructions (Addendum)
Please pick up  triamcinolone 0.1% cream to apply twice daily for 7 days.   Please call back if there is no improvement she will need to have in person evaluation for her rash.   Poison Oak Dermatitis  Poison oak dermatitis is inflammation of the skin that is caused by contact with the chemicals in the leaves of the poison oak (Toxicodendron) plant. The skin reaction often includes redness, swelling, blisters, and extreme itching. What are the causes? This condition is caused by a specific chemical (urushiol) that is found in the sap of the poison oak plant. This chemical is sticky and can be easily spread to people, animals, and objects. You can get poison oak dermatitis by:  Having direct contact with a poison oak plant.  Touching animals, other people, or objects that have come in contact with poison oak and have the chemical on them. What increases the risk? This condition is more likely to develop in people who:  Are outdoors often in wooded or [place] areas.  Go outdoors without wearing protective clothing, such as closed shoes, long pants, and a long-sleeved shirt. What are the signs or symptoms? Symptoms of this condition include:  Redness of the skin.  Extreme itching.  A rash that often includes bumps and blisters. The rash usually appears 48 hours after exposure if you have been exposed before. If this is the first time you have been exposed, the rash may not appear until a week after exposure.  Swelling. This may occur if the reaction is more severe. Symptoms usually last for 1-2 weeks. However, the first time you develop this condition, symptoms may last 3-4 weeks. How is this diagnosed? This condition may be diagnosed based on your symptoms and a physical exam. Your health care provider may also ask you about any recent outdoor activity. How is this treated? Treatment for this condition will vary depending on how severe it is. Treatment may include:  Hydrocortisone  creams or calamine lotions to relieve itching.  Oatmeal baths to soothe the skin.  Over-the-counter antihistamine medicines to help reduce itching.  Steroid medicine taken by mouth (orally) for more severe reactions. Follow these instructions at home: Medicines  Take or apply over-the-counter and prescription medicines only as told by your health care provider.  Use hydrocortisone creams or calamine lotion as needed to soothe the skin and relieve itching. General instructions  Do not scratch or rub your skin.  Apply a cold, wet cloth (cold compress) to the affected areas or take baths in cool water. This will help with itching. Avoid hot baths and showers.  Take oatmeal baths as needed. Use colloidal oatmeal. You can get this at your local pharmacy or grocery store. Follow the instructions on the packaging.  While you have the rash, wash clothes right after you wear them.  Keep all follow-up visits as told by your health care provider. This is important. How is this prevented?   Learn to identify the poison oak plant and avoid contact with the plant. This plant can be recognized by the number of leaves. Generally, poison oak has three leaves with flowering branches on a single stem. The leaves are often a bit fuzzy and have a toothlike edge.  If you have been exposed to poison oak, thoroughly wash your skin with soap and water right away. You have about 30 minutes to remove the plant resin before it will cause the rash. Be sure to wash under your fingernails because any plant resin there  will continue to spread the rash.  When hiking or camping, wear clothes that will help you avoid exposure on the skin. This includes long pants, a long-sleeved shirt, tall socks, and hiking boots. You can also apply preventive lotion to your skin to help limit exposure.  If you suspect that your clothes or outdoor gear came in contact with poison oak, rinse them off outside with a garden hose before  bringing them inside your house.  When doing yard work or gardening, wear gloves, long sleeves, long pants, and boots. Wash your garden tools and gloves if they come in contact with poison oak.  If you suspect that your pet has come into contact with poison oak, wash him or her with pet shampoo and water. Make sure you wear gloves while washing your pet.  Do not burn poison oak plants. This can release the chemical from the plant into the air and may cause a reaction on the skin or eyes, or in the lungs from breathing in the smoke. Contact a health care provider if you have:  Open sores in the rash area.  More redness, swelling, or pain in the affected area.  Redness that spreads beyond the rash area.  Fluid, blood, or pus coming from the affected area.  A fever.  A rash over a large area of your body.  A rash on your eyes, mouth, or genitals.  A rash that does not improve after a few weeks. Get help right away if:  Your face swells or your eyes swell shut.  You have trouble breathing.  You have trouble swallowing. These symptoms may represent a serious problem that is an emergency. Do not wait to see if the symptoms will go away. Get medical help right away. Call your local emergency services (911 in the [place] Do not drive yourself to the hospital. Summary  Poison oak dermatitis is inflammation of the skin that is caused by contact with the chemicals on the leaves of the poison oak (Toxicodendron) plant.  Symptoms of this condition include redness, extreme itching, a rash, and swelling.  Do not scratch or rub your skin.  Take or apply over-the-counter and prescription medicines only as told by your health care provider. This information is not intended to replace advice given to you by your health care provider. Make sure you discuss any questions you have with your health care provider. Document Revised: 09/09/2018 Document Reviewed: 06/17/2018 [employer] Patient Education   [place].

## 2020-03-10 ENCOUNTER — Encounter: Payer: Self-pay | Admitting: Family Medicine

## 2020-03-13 ENCOUNTER — Encounter: Payer: Self-pay | Admitting: Rheumatology

## 2020-03-13 NOTE — Telephone Encounter (Signed)
She has osteoarthritis.  Anti-inflammatories will increase the risk of GI bleed, hepatic and renal toxicity.  I would not advise any of the anti-inflammatories.  Tylenol is the best option.  If the pain is not controlled with Tylenol then we can refer her to pain management.

## 2020-03-20 ENCOUNTER — Other Ambulatory Visit: Payer: Self-pay | Admitting: Internal Medicine

## 2020-03-20 ENCOUNTER — Other Ambulatory Visit: Payer: Self-pay | Admitting: Family Medicine

## 2020-03-21 ENCOUNTER — Encounter: Payer: Self-pay | Admitting: Family Medicine

## 2020-03-25 ENCOUNTER — Ambulatory Visit (INDEPENDENT_AMBULATORY_CARE_PROVIDER_SITE_OTHER): Payer: PPO | Admitting: Internal Medicine

## 2020-03-25 VITALS — BP 132/73 | HR 70 | Ht 64.0 in | Wt 182.0 lb

## 2020-03-25 DIAGNOSIS — G4733 Obstructive sleep apnea (adult) (pediatric): Secondary | ICD-10-CM | POA: Diagnosis not present

## 2020-03-25 DIAGNOSIS — Z9989 Dependence on other enabling machines and devices: Secondary | ICD-10-CM

## 2020-03-25 DIAGNOSIS — Z7189 Other specified counseling: Secondary | ICD-10-CM | POA: Diagnosis not present

## 2020-03-25 DIAGNOSIS — J309 Allergic rhinitis, unspecified: Secondary | ICD-10-CM

## 2020-03-25 DIAGNOSIS — K219 Gastro-esophageal reflux disease without esophagitis: Secondary | ICD-10-CM

## 2020-03-25 NOTE — Patient Instructions (Signed)

## 2020-03-25 NOTE — Progress Notes (Signed)
[facility] [address] [place], [place] [address]  Pulmonary Sleep Medicine   Office Visit Note  Patient Name: Dana Obrien DOB: 1945/08/14 MRN [id number]    Chief Complaint: Obstructive Sleep Apnea visit  Brief History:  Dana Obrien is seen today for initial consultation. It has been 4 years since her last visit. The patient has a 4 year history of sleep apnea. Patient is not using PAP nightly and has not used in it since the recall in July. The CPAP is showing no use in 8 months. Her machine is on the recall.  She stopped using the CPAP because it caused her to cough and be congested. She states that her humidifier is set on max. She is using flonase and Zyrtec daily.  The patient feels much more rest after sleeping with PAP.  The patient reports benefiting from PAP use. Reported sleepiness is  Worse since stopping CPAP but was much better CPAP and the Epworth Sleepiness Score is 1 out of 24. The patient does not take naps. The patient complains of the following: cough.  The compliance download shows very poor compliance with no use since February with an average use time of 7.4 hours. The AHI is 0  The patient does not of limb movements disrupting sleep.  ROS  General: (-) fever, (-) chills, (-) night sweat Nose and Sinuses: (-) nasal stuffiness or itchiness, (-) postnasal drip, (-) nosebleeds, (-) sinus trouble. Mouth and Throat: (-) sore throat, (-) hoarseness. Neck: (-) swollen glands, (-) enlarged thyroid, (-) neck pain. Respiratory: + cough, - shortness of breath, - wheezing. Neurologic: - numbness, - tingling. Psychiatric: - anxiety, - depression   Current Medication: Outpatient Encounter Medications as of 03/25/2020  Medication Sig  . EQ ALLERGY RELIEF, CETIRIZINE, 10 MG tablet Take 1 tablet by mouth once daily  . FLUoxetine (PROZAC) 20 MG tablet Take 1 tablet (20 mg total) by mouth daily.  . fluticasone (FLONASE) 50 MCG/ACT nasal spray Place 1 spray  into both nostrils daily.  . hydrochlorothiazide (HYDRODIURIL) 25 MG tablet Take 1 tablet by mouth once daily  . hydrOXYzine (ATARAX/VISTARIL) 10 MG tablet Take 1 tablet (10 mg total) by mouth 3 (three) times daily as needed for itching.  . letrozole (FEMARA) 2.5 MG tablet Take 1 tablet by mouth once daily  . Multiple Vitamin (MULTI-VITAMIN) tablet Take by mouth.  . Omega-3 Fatty Acids (FISH OIL PO) Take by mouth daily.  . ondansetron (ZOFRAN ODT) 4 MG disintegrating tablet Allow 1-2 tablets to dissolve in your mouth every 8 hours as needed for nausea/vomiting  . pantoprazole (PROTONIX) 40 MG tablet Take 1 tablet (40 mg total) by mouth daily.  . rosuvastatin (CRESTOR) 20 MG tablet Take 1 tablet by mouth once daily  . rosuvastatin (CRESTOR) 40 MG tablet Take 1 tablet (40 mg total) by mouth daily.  Marland Kitchen senna (SENOKOT) 8.6 MG TABS tablet Take 1 tablet by mouth at bedtime.   . SUMAtriptan (IMITREX) 20 MG/ACT nasal spray Place 1 spray (20 mg total) into the nose as needed for migraine or headache. May repeat in 2 hours if headache persists or recurs.  . traMADol (ULTRAM) 50 MG tablet Take 1 tablet (50 mg total) by mouth every 12 (twelve) hours as needed.  . traZODone (DESYREL) 50 MG tablet Take 0.5-1 tablets (25-50 mg total) by mouth at bedtime as needed for sleep.  Marland Kitchen tretinoin (RETIN-A) 0.05 % cream Apply 1 application topically at bedtime.  . [DISCONTINUED] sertraline (ZOLOFT) 100 MG tablet  Take 1 tablet (100 mg total) by mouth daily for 7 days, THEN 0.5 tablets (50 mg total) daily for 7 days. Then discontinue.Marland Kitchen   No facility-administered encounter medications on file as of 03/25/2020.    Surgical History: Past Surgical History:  Procedure Laterality Date  . ABDOMINAL HYSTERECTOMY    . ANTERIOR AND POSTERIOR REPAIR    . BREAST BIOPSY Right 2004   stereo. infiltrating ductal carcinoma  . BREAST BIOPSY Left 09/07/2017   US guided biopsy/positive- invasaive mammary carcinoma  . 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  3.5 cm maximum diameter, minimal margins 5 mm.  In situ component less than 10%.  Marland Kitchen BREAST LUMPECTOMY Right 2004  . BREAST LUMPECTOMY Left 09/17/2017   Procedure: BREAST EXCISION, SENTINEL NODE BIOPSY;  Surgeon: Robert Bellow, MD;  Location: [facility] OR[facility];  Service: General;  Laterality: Left;  . COLONOSCOPY  2015  . COLONOSCOPY WITH PROPOFOL N/A 10/04/2019   Procedure: COLONOSCOPY WITH PROPOFOL;  Surgeon: Robert Bellow, MD;  Location: [facility] END[facility]COPY;  Service: Endoscopy;  Laterality: N/A;  . DILATION AND CURETTAGE OF UTERUS    . ETHMOIDECTOMY Bilateral 11/05/2016   Procedure: ETHMOIDECTOMY;  Surgeon: Melida Quitter, MD;  Location: [place];  Service: ENT;  Laterality: Bilateral;  . LAPAROTOMY    . MAXILLARY ANTROSTOMY Bilateral 11/05/2016   Procedure: MAXILLARY ANTROSTOMY;  Surgeon: Melida Quitter, MD;  Location: [name];  Service: ENT;  Laterality: Bilateral;  . SINUS ENDO W/FUSION Bilateral 11/05/2016   Procedure: FRONTAL RECESS EXPLORATION;  Surgeon: Melida Quitter, MD;  Location: [place];  Service: ENT;  Laterality: Bilateral;  BILATERAL ENDOSCOPIC SINUS SURGERY WITH FUSION  . SINUS EXPLORATION  11/05/2016  . SPHENOIDECTOMY Bilateral 11/05/2016   Procedure: SPHENOIDECTOMY;  Surgeon: Melida Quitter, MD;  Location: [facility];  Service: ENT;  Laterality: Bilateral;    Medical History: Past Medical History:  Diagnosis Date  . Anxiety   . Arthritis   . Asthma   . Breast cancer ([place]) 2004   right breast  . Breast cancer ([place]) 09/08/2017   left breast  . Breast cancer of upper-outer quadrant of left female breast ([place]) 09/17/2017   T1c, N0, ER 90%, PR 90%, 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  . Cancer [facility]) 2004   right lumpectomy, chemotherapy and radiation Dr. Jeb Levering  . Depression   . GERD (gastroesophageal reflux disease)   . Hypertension   . Migraine   . Personal history of chemotherapy 2004  . Personal history  of radiation therapy 2004  . Personal history of radiation therapy 2019  . PONV (postoperative nausea and vomiting)   . Shingles   . Sleep apnea    uses CPAP, severe OSA  . Vitamin D deficiency    IN THE PAST    Family History: Non contributory to the present illness  Social History: Social History   Socioeconomic History  . Marital status: Widowed    Spouse name: Not on file  . Number of children: Not on file  . Years of education: Not on file  . Highest education level: Not on file  Occupational History  . Not on file  Tobacco Use  . Smoking status: Never Smoker  . Smokeless tobacco: Never Used  Vaping Use  . Vaping Use: Never used  Substance and Sexual Activity  . Alcohol use: Yes    Comment: rarely  . Drug use: No  . Sexual activity: Not Currently  Other Topics Concern  . Not  on file  Social History Narrative   Daily Caffeine Use:  1-2 coffee   Regular Exercise -  NO   Social Determinants of Health   Financial Resource Strain:   . Difficulty of Paying Living Expenses: Not on file  Food Insecurity:   . Worried About Programme researcher, broadcasting/film/video in the Last Year: Not on file  . Ran Out of Food in the Last Year: Not on file  Transportation Needs: No Transportation Needs  . Lack of Transportation (Medical): No  . Lack of Transportation (Non-Medical): No  Physical Activity: Unknown  . Days of Exercise per Week: 0 days  . Minutes of Exercise per Session: Not on file  Stress:   . Feeling of Stress : Not on file  Social Connections: Unknown  . Frequency of Communication with Friends and Family: More than three times a week  . Frequency of Social Gatherings with Friends and Family: More than three times a week  . Attends Religious Services: Not on file  . Active Member of Clubs or Organizations: Not on file  . Attends Banker Meetings: Not on file  . Marital Status: Widowed  Intimate Partner Violence: Not At Risk  . Fear of Current or Ex-Partner: No  .  Emotionally Abused: No  . Physically Abused: No  . Sexually Abused: No    Vital Signs: Blood pressure 132/73, pulse 70, height 5\' 4"  (1.626 m), weight 182 lb (82.6 kg), SpO2 97 %.  Examination: General Appearance: The patient is well-developed, well-nourished, and in no distress. Neck Circumference: 39 Skin: Gross inspection of skin unremarkable. Head: normocephalic, no gross deformities. Eyes: no gross deformities noted. ENT: ears appear grossly normal Neurologic: Alert and oriented. No involuntary movements.    EPWORTH SLEEPINESS SCALE:  Scale:  (0)= no chance of dozing; (1)= slight chance of dozing; (2)= moderate chance of dozing; (3)= high chance of dozing  Chance  Situtation    Sitting and reading: 0    Watching TV: 0    Sitting Inactive in public: 0    As a passenger in car: 0      Lying down to rest: 1    Sitting and talking: 0    Sitting quielty after lunch: 0    In a car, stopped in traffic: 0   TOTAL SCORE:   1 out of 24    SLEEP STUDIES:  1. PSG 10/2015 AHI 34 SpO103min 78%   CPAP COMPLIANCE DATA:  Date Range: 03/26/19-03/24/20  Average Daily Use: 7.4 hours  Median Use:    Compliance for > 4 Hours: 9%  AHI: 0 respiratory events per hour  Days Used: 37/365  Mask Leak: 27  95th Percentile Pressure: 13         LABS: Recent Results (from the past 2160 hour(s))  Direct LDL     Status: None   Collection Time: 02/23/20 10:11 AM  Result Value Ref Range   Direct LDL 84.0 mg/dL    Comment: Optimal:  02/25/20 mg/dLNear or Above Optimal:  100-129 mg/dLBorderline High:  130-159 mg/dLHigh:  160-189 mg/dLVery High:  >190 mg/dL  Comp Met (CMET)     Status: Abnormal   Collection Time: 02/23/20 10:11 AM  Result Value Ref Range   Sodium 142 135 - 145 mEq/L   Potassium 3.4 (L) 3.5 - 5.1 mEq/L   Chloride 100 96 - 112 mEq/L   CO2 32 19 - 32 mEq/L   Glucose, Bld 98 70 - 99 mg/dL   BUN  19 6 - 23 mg/dL   Creatinine, Ser 0.74 0.40 - 1.20 mg/dL    Total Bilirubin 0.5 0.2 - 1.2 mg/dL   Alkaline Phosphatase 55 39 - 117 U/L   AST 19 0 - 37 U/L   ALT 18 0 - 35 U/L   Total Protein 7.1 6.0 - 8.3 g/dL   Albumin 4.4 3.5 - 5.2 g/dL   GFR 77.03 >60.00 mL/min   Calcium 10.1 8.4 - 10.5 mg/dL  B12     Status: None   Collection Time: 02/23/20 10:11 AM  Result Value Ref Range   Vitamin B-12 411 211 - 911 pg/mL  Vitamin D (25 hydroxy)     Status: Abnormal   Collection Time: 02/23/20 10:11 AM  Result Value Ref Range   VITD 25.38 (L) 30.00 - 100.00 ng/mL  CBC     Status: None   Collection Time: 02/23/20 10:11 AM  Result Value Ref Range   WBC 7.3 4.0 - 10.5 K/uL   RBC 4.27 3.87 - 5.11 Mil/uL   Platelets 188.0 150 - 400 K/uL   Hemoglobin 13.3 12.0 - 15.0 g/dL   HCT 39.2 36 - 46 %   MCV 91.7 78.0 - 100.0 fl   MCHC 33.9 30.0 - 36.0 g/dL   RDW 14.9 11.5 - 15.5 %    Radiology: DG Bone Density  Result Date: 12/13/2019 EXAM: DUAL X-RAY ABSORPTIOMETRY (DXA) FOR BONE MINERAL DENSITY IMPRESSION: Your patient Ivis Nicolson completed a BMD test on 12/13/2019 using the [facility] (software version: 14.10) manufactured by [employer]. The following summarizes the results of our evaluation. Technologist: [facility] PATIENT BIOGRAPHICAL: Name: Paulette, [place] Patient ID: [id number] Birth Date: [birth year] Height: 63.5 in. Gender: Female Exam Date: 12/13/2019 Weight: 182.4 lbs. Indications: Postmenopausal, History of Breast Cancer, Breast CA, History of Chemo, History of Radiation, Advanced Age, Previous Chemo and Radiation, Caucasian, High Risk Meds Fractures: Treatments: Calcium, Femara, Vitamin D DENSITOMETRY RESULTS: Site         Region     Measured Date Measured Age WHO Classification Young Adult T-score BMD         %Change vs. Previous Significant Change (*) DualFemur Neck Right 12/13/2019 72.3 Normal 0.3 1.080 g/cm2 2.9% - DualFemur Neck Right 01/26/2018 70.4 Normal 0.1 1.050 g/cm2 -3.8% - DualFemur Neck Right 05/06/2011 63.7 Normal  0.4 1.092 g/cm2 5.3% Yes DualFemur Neck Right 05/06/2011 63.7 Normal 0.0 1.037 g/cm2 - - DualFemur Total Mean 12/13/2019 72.3 Normal 1.7 1.223 g/cm2 0.5% - DualFemur Total Mean 01/26/2018 70.4 Normal 1.7 1.217 g/cm2 -0.6% - DualFemur Total Mean 05/06/2011 63.7 Normal 1.7 1.224 g/cm2 1.7% - DualFemur Total Mean 05/06/2011 63.7 Normal 1.6 1.203 g/cm2 - - Left Forearm Radius 33% 12/13/2019 72.3 Normal -0.6 0.827 g/cm2 -1.2% - Left Forearm Radius 33% 01/26/2018 70.4 Normal -0.4 0.837 g/cm2 5.9% Yes Left Forearm Radius 33% 05/06/2011 63.7 Normal -1.0 0.790 g/cm2 2.3% - Left Forearm Radius 33% 05/06/2011 63.7 Osteopenia -1.2 0.772 g/cm2 - - ASSESSMENT: The BMD measured at Forearm Radius 33% is 0.827 g/cm2 with a T-score of -0.6. This patient is considered normal according to [facility]) criteria. The scan quality is good. Lumbar spine was not utilized due to scoliosis. Compared with prior study, there has been no significant change in the total hip. World Health Organization [facility]) criteria for post-menopausal, Caucasian Women: Normal:                   T-score at or above -1 SD  Osteopenia/low bone mass: T-score between -1 and -2.5 SD Osteoporosis:             T-score at or below -2.5 SD RECOMMENDATIONS: 1. All patients should optimize calcium and vitamin D intake. 2. Consider FDA-approved medical therapies in postmenopausal women and men aged 37 years and older, based on the following: a. A hip or vertebral(clinical or morphometric) fracture b. T-score < -2.5 at the femoral neck or spine after appropriate evaluation to exclude secondary causes c. Low bone mass (T-score between -1.0 and -2.5 at the femoral neck or spine) and a 10-year probability of a hip fracture > 3% or a 10-year probability of a major osteoporosis-related fracture > 20% based on the [place]-adapted WHO algorithm 3. Clinician judgment and/or patient preferences may indicate treatment for people with 10-year fracture probabilities above or  below these levels FOLLOW-UP: People with diagnosed cases of osteoporosis or at high risk for fracture should have regular bone mineral density tests. For patients eligible for Medicare, routine testing is allowed once every 2 years. The testing frequency can be increased to one year for patients who have rapidly progressing disease, those who are receiving or discontinuing medical therapy to restore bone mass, or have additional risk factors. I have reviewed this report, and agree with the above findings. [facility] Electronically Signed   By: Lowella Grip III M.D.   On: 12/13/2019 14:10   MM DIAG BREAST TOMO BILATERAL  Result Date: 12/13/2019 CLINICAL DATA:  History of left breast cancer status post lumpectomy in 2019. History of right breast cancer status post lumpectomy in 2004. EXAM: DIGITAL DIAGNOSTIC BILATERAL MAMMOGRAM WITH TOMO AND CAD COMPARISON:  Previous exam(s). ACR Breast Density Category b: There are scattered areas of fibroglandular density. FINDINGS: Stable lumpectomy changes are seen in each breast. No suspicious mass or malignant type microcalcifications identified in either breast. Mammographic images were processed with CAD. IMPRESSION: No evidence of malignancy in either breast. RECOMMENDATION: Bilateral diagnostic mammogram in 1 year is recommended. I have discussed the findings and recommendations with the patient. If applicable, a reminder letter will be sent to the patient regarding the next appointment. BI-RADS CATEGORY  2: Benign. Electronically Signed   By: Lillia Mountain M.D.   On: 12/13/2019 11:11    No results found.  No results found.    Assessment and Plan: Patient Active Problem List   Diagnosis Date Noted  . CPAP use counseling 03/25/2020  . Gastroesophageal reflux disease without esophagitis 03/25/2020  . OSA on CPAP 03/25/2020  . Dermatitis due to plants, including poison ivy, sumac, and oak 02/29/2020  . Neuropathy 12/15/2019  . Allergic  rhinitis 07/05/2019  . Brain atrophy ([place]) 05/18/2019  . Bilateral hearing loss 08/15/2018  . Decreased energy 08/15/2018  . Chest tightness 06/02/2018  . Prediabetes 06/02/2018  . Arthralgia 06/02/2018  . Congestion of both ears 06/02/2018  . BPPV (benign paroxysmal positional vertigo), right 03/11/2018  . Dysfunction of right eustachian tube 03/11/2018  . Malignant neoplasm of upper-outer quadrant of left breast in female, estrogen receptor positive ([place]) 09/14/2017  . Radiculopathy 08/24/2017  . Fatty liver 08/24/2017  . Abdominal pain 08/24/2017  . Hearing difficulty of both ears 06/25/2017  . Chronic sinusitis 11/05/2016  . Bilateral sensorineural hearing loss 10/13/2016  . Right ear impacted cerumen 10/13/2016  . GERD (gastroesophageal reflux disease) 09/07/2016  . Cough 07/08/2016  . Exertional shortness of breath 01/15/2016  . Uterine prolapse 11/14/2015  . OSA (obstructive sleep apnea) 11/01/2015  . Back  muscle spasm 04/30/2015  . DDD (degenerative disc disease), lumbar 04/30/2015  . Lumbar radiculitis 04/30/2015  . Low back pain 01/01/2015  . Varicose vein of leg 11/22/2014  . Post herpetic neuralgia 03/06/2014  . Increased frequency of urination 11/17/2013  . Rectocele 11/17/2013  . Cystocele 10/25/2013  . Skin lesion 10/25/2013  . Disorder of skin and subcutaneous tissue 10/25/2013  . Midline cystocele 10/25/2013  . Pelvic and perineal pain 10/25/2013  . Obesity, unspecified 10/19/2012  . History of breast cancer 02/11/2012  . Hyperlipidemia 05/11/2011  . Osteopenia 05/11/2011  . Disorder of bone and cartilage 05/11/2011  . Anxiety and depression 03/04/2011  . Hypertension 03/04/2011      The patient does not tolerate PAP and reports significant benefit from PAP use. The patient stopped using CPAP since getting the recall notice but was advised to restart until she can get a replacement unit.  She was reminded how to clean the CPAP and advised to stop using  the [place]. The patient was advised to continue using the CPAP until she gets a replacement. She may benefit from adding a HEPA filter. The compliance is poor but was very good prior to the recalle. The apnea is well controlled.   1. OSA- use CPAP nightly and follow up 30+days after set up 2. CPAP couseling-Discussed importance of adequate CPAP use as well as proper care and cleaning techniques of machine and all supplies. 3. GERD:  Controlled well on medication. Pt followed and managed by PCP. 4. Allergic rhinitis:  Symptoms controlled. Followed and managed by PCP 5. Patient is on prozac which may also effect sleep quality and WASO  General Counseling: I have discussed the findings of the evaluation and examination with Anderson Malta.  I have also discussed any further diagnostic evaluation thatmay be needed or ordered today. Lillieann verbalizes understanding of the findings of todays visit. We also reviewed her medications today and discussed drug interactions and side effects including but not limited excessive drowsiness and altered mental states. We also discussed that there is always a risk not just to her but also people around her. she has been encouraged to call the office with any questions or concerns that should arise related to todays visit.  This patient was seen by Shannan Harper, AGNP-C in collaboration with Dr. Devona Konig as a part of collaborative care agreement.       I have personally obtained a history, examined the patient, evaluated laboratory and imaging results, formulated the assessment and plan and placed orders.   Richelle Ito Saunders Glance, PhD, FAASM  Diplomate, American Board of Sleep Medicine    Allyne Gee, MD [facility] Diplomate ABMS Pulmonary and Critical Care Medicine Sleep medicine

## 2020-03-26 ENCOUNTER — Encounter: Payer: Self-pay | Admitting: Family Medicine

## 2020-03-26 MED ORDER — FLUOXETINE HCL 20 MG PO TABS: 40.0000 mg | ORAL_TABLET | Freq: Every day | ORAL | 3 refills | Status: DC

## 2020-03-28 ENCOUNTER — Encounter: Payer: Self-pay | Admitting: Oncology

## 2020-03-28 ENCOUNTER — Inpatient Hospital Stay: Payer: PPO | Attending: Oncology | Admitting: Oncology

## 2020-03-28 ENCOUNTER — Other Ambulatory Visit: Payer: Self-pay

## 2020-03-28 VITALS — BP 142/82 | HR 78 | Temp 98.5°F | Resp 16 | Ht 64.0 in | Wt 181.5 lb

## 2020-03-28 DIAGNOSIS — Z8349 Family history of other endocrine, nutritional and metabolic diseases: Secondary | ICD-10-CM | POA: Diagnosis not present

## 2020-03-28 DIAGNOSIS — F329 Major depressive disorder, single episode, unspecified: Secondary | ICD-10-CM | POA: Diagnosis not present

## 2020-03-28 DIAGNOSIS — M25519 Pain in unspecified shoulder: Secondary | ICD-10-CM | POA: Insufficient documentation

## 2020-03-28 DIAGNOSIS — C50412 Malignant neoplasm of upper-outer quadrant of left female breast: Secondary | ICD-10-CM | POA: Diagnosis not present

## 2020-03-28 DIAGNOSIS — K219 Gastro-esophageal reflux disease without esophagitis: Secondary | ICD-10-CM | POA: Insufficient documentation

## 2020-03-28 DIAGNOSIS — Z808 Family history of malignant neoplasm of other organs or systems: Secondary | ICD-10-CM | POA: Insufficient documentation

## 2020-03-28 DIAGNOSIS — Z923 Personal history of irradiation: Secondary | ICD-10-CM | POA: Diagnosis not present

## 2020-03-28 DIAGNOSIS — Z08 Encounter for follow-up examination after completed treatment for malignant neoplasm: Secondary | ICD-10-CM

## 2020-03-28 DIAGNOSIS — Z885 Allergy status to narcotic agent status: Secondary | ICD-10-CM | POA: Diagnosis not present

## 2020-03-28 DIAGNOSIS — I10 Essential (primary) hypertension: Secondary | ICD-10-CM | POA: Insufficient documentation

## 2020-03-28 DIAGNOSIS — M2550 Pain in unspecified joint: Secondary | ICD-10-CM

## 2020-03-28 DIAGNOSIS — Z79899 Other long term (current) drug therapy: Secondary | ICD-10-CM | POA: Insufficient documentation

## 2020-03-28 DIAGNOSIS — Z8261 Family history of arthritis: Secondary | ICD-10-CM | POA: Insufficient documentation

## 2020-03-28 DIAGNOSIS — F419 Anxiety disorder, unspecified: Secondary | ICD-10-CM | POA: Diagnosis not present

## 2020-03-28 DIAGNOSIS — Z9221 Personal history of antineoplastic chemotherapy: Secondary | ICD-10-CM | POA: Insufficient documentation

## 2020-03-28 DIAGNOSIS — Z79811 Long term (current) use of aromatase inhibitors: Secondary | ICD-10-CM | POA: Diagnosis not present

## 2020-03-28 DIAGNOSIS — Z170 Estrogen receptor positive status [ER+]: Secondary | ICD-10-CM | POA: Diagnosis not present

## 2020-03-28 DIAGNOSIS — Z8249 Family history of ischemic heart disease and other diseases of the circulatory system: Secondary | ICD-10-CM | POA: Insufficient documentation

## 2020-03-28 DIAGNOSIS — G8929 Other chronic pain: Secondary | ICD-10-CM | POA: Diagnosis not present

## 2020-03-28 DIAGNOSIS — Z853 Personal history of malignant neoplasm of breast: Secondary | ICD-10-CM | POA: Diagnosis not present

## 2020-03-28 DIAGNOSIS — G4733 Obstructive sleep apnea (adult) (pediatric): Secondary | ICD-10-CM | POA: Insufficient documentation

## 2020-03-28 DIAGNOSIS — Z823 Family history of stroke: Secondary | ICD-10-CM | POA: Diagnosis not present

## 2020-03-28 DIAGNOSIS — Z818 Family history of other mental and behavioral disorders: Secondary | ICD-10-CM | POA: Insufficient documentation

## 2020-03-28 MED ORDER — CELECOXIB 100 MG PO CAPS: 100.0000 mg | ORAL_CAPSULE | Freq: Two times a day (BID) | ORAL | 1 refills | Status: DC | PRN

## 2020-03-30 NOTE — Progress Notes (Signed)
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  Chief complaint/ Reason for visit-routine follow-up of breast cancer on letrozole  Heme/Onc history: 2004[facility] and X[facility]. T2N0M0.She thenn completed 5 years of aromasin until 2009.She could not tolerate arimidex.  Sscreening mammogram on 08/25/2017 showed a possible mass in her left breast. This was followed by an ultrasound and a diagnostic left mammogram which showed a 0.7 x 0.5 x 0.7 cm mass in the left breast. No evidence of left axillary adenopathy.  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 ER greater than 90% positive, PR greater than 90% positive and HER-2/neu negative   Patient has been seen by Dr. Camelia Phenes is scheduled to undergo surgery on 09/17/2017  She is G2P2L2. Menarche at the age of 30. Menopause in her early 89's. She used to take prempro from [age] yrs but none since then. She does have chronic back pain and tingling/pain in her extremities. Cervical spine MRI showed egenrative disease  Final pathology showed:Invasive mammary carcinoma 13 mm, grade 1 with associated DCIS. Negative margins. 0 out of 3 lymph nodes were positive for malignancy. 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  She had problems tolerating Aromasin and was switched to letrozole  Interval history-patient is currently doing well for her age.  She is taking letrozole daily but does report aches and pains in her joints including her shoulders hands and knees.  ECOG PS- 1 Pain scale- 7   Review of systems- Review of Systems  Constitutional: Positive for malaise/fatigue. Negative for chills, fever and weight loss.  HENT: Negative for congestion, ear discharge and nosebleeds.   Eyes: Negative for blurred vision.  Respiratory: Negative for cough, hemoptysis, sputum production, shortness of breath and wheezing.   Cardiovascular: Negative for chest pain, palpitations, orthopnea and claudication.  Gastrointestinal: Negative for abdominal pain, blood in stool, constipation, diarrhea, heartburn, melena, nausea and vomiting.  Genitourinary: Negative for dysuria, flank pain, frequency, hematuria and urgency.  Musculoskeletal: Positive for joint pain. Negative for back pain and myalgias.  Skin: Negative for rash.  Neurological: Negative for dizziness, tingling, focal weakness, seizures, weakness and headaches.  Endo/Heme/Allergies: Does not bruise/bleed easily.  Psychiatric/Behavioral: Negative for depression and suicidal ideas. The patient does not have insomnia.       Allergies  Allergen Reactions  . Lidocaine Palpitations  . Codeine Nausea Only and Nausea And Vomiting  . Morphine Nausea Only  . Niacin Rash  . Niacin And Related Rash    ANTIHYPERLIPEMICS     Past Medical History:  Diagnosis Date  . Anxiety   . Arthritis   . Asthma   . Breast cancer [facility]) 2004   right breast  . Breast  cancer ([place]) 09/08/2017   left breast  . Breast cancer of upper-outer quadrant of left female breast ([place]) 09/17/2017     T1c, N0, ER 90%, PR 90%, 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  . Cancer [facility]) 2004   right lumpectomy, chemotherapy and radiation Dr. Jeb Levering  . CPAP (continuous positive airway pressure) dependence   . Depression   . GERD (gastroesophageal reflux disease)   . Hypertension   . Migraine   . Personal history of chemotherapy 2004  . Personal history of radiation therapy 2004  . Personal history of radiation therapy 2019  . PONV (postoperative nausea and vomiting)   . Shingles   . Sleep apnea    uses CPAP, severe OSA  . Vitamin D deficiency    IN THE PAST     Past Surgical History:  Procedure Laterality Date  . ABDOMINAL HYSTERECTOMY    . ANTERIOR AND POSTERIOR REPAIR    . BREAST BIOPSY Right 2004   stereo. infiltrating ductal carcinoma  . BREAST BIOPSY Left 09/07/2017   US guided biopsy/positive- invasaive mammary carcinoma  . 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  3.5 cm maximum diameter, minimal margins 5 mm.  In situ component less than 10%.  Marland Kitchen BREAST LUMPECTOMY Right 2004  . BREAST LUMPECTOMY Left 09/17/2017   Procedure: BREAST EXCISION, SENTINEL NODE BIOPSY;  Surgeon: Robert Bellow, MD;  Location: [facility] ORS;  Service: General;  Laterality: Left;  . COLONOSCOPY  2015  . COLONOSCOPY WITH PROPOFOL N/A 10/04/2019   Procedure: COLONOSCOPY WITH PROPOFOL;  Surgeon: Robert Bellow, MD;  Location: [facility] ENDOSCOPY;  Service: Endoscopy;  Laterality: N/A;  . DILATION AND CURETTAGE OF UTERUS    . ETHMOIDECTOMY Bilateral 11/05/2016   Procedure: ETHMOIDECTOMY;  Surgeon: Melida Quitter, MD;  Location: [place];  Service: ENT;  Laterality: Bilateral;  . LAPAROTOMY    . MAXILLARY ANTROSTOMY Bilateral 11/05/2016   Procedure: MAXILLARY ANTROSTOMY;  Surgeon: Melida Quitter, MD;  Location: [place];  Service: ENT;  Laterality: Bilateral;  . SINUS ENDO W/FUSION Bilateral 11/05/2016   Procedure: FRONTAL RECESS EXPLORATION;  Surgeon: Melida Quitter, MD;  Location: [place];  Service: ENT;  Laterality: Bilateral;  BILATERAL ENDOSCOPIC SINUS SURGERY WITH FUSION  . SINUS EXPLORATION  11/05/2016  . SPHENOIDECTOMY Bilateral 11/05/2016   Procedure: SPHENOIDECTOMY;  Surgeon: Melida Quitter, MD;  Location: [facility] OR;  Service: ENT;  Laterality: Bilateral;    Social History   Socioeconomic History  . Marital status: Widowed    Spouse name: Not on file  . Number of children: Not on file  . Years of education: Not on file  . Highest education level: Not on file  Occupational History  . Not on file  Tobacco Use  . Smoking status: Never Smoker  . Smokeless tobacco: Never Used  Vaping Use  . Vaping Use: Never used  Substance and Sexual Activity  . Alcohol use: Yes    Comment: rarely  . Drug use: No  . Sexual activity: Not Currently  Other Topics Concern  . Not on file  Social History Narrative   Daily Caffeine Use:  1-2 coffee   Regular Exercise -  NO   Social Determinants of Health   Financial Resource Strain:   . Difficulty of Paying Living Expenses: Not on file  Food Insecurity:   . Worried About Charity fundraiser in the Last Year: Not on file  . Ran Out of Food in the Last Year: Not  on file  Transportation Needs: No Transportation Needs  . Lack of Transportation (Medical): No  . Lack of Transportation (Non-Medical): No  Physical Activity: Unknown  . Days of Exercise per Week: 0 days  . Minutes of Exercise per Session: Not on file  Stress:   . Feeling of Stress : Not on file  Social Connections: Unknown  . Frequency of Communication with Friends and Family: More than three times a week  . Frequency of Social Gatherings with Friends and Family: More than three times a week  . Attends Religious Services: Not on file  . Active Member of Clubs or Organizations: Not on file  . Attends Archivist Meetings: Not on file  . Marital Status: Widowed  Intimate Partner Violence: Not At Risk  . Fear of Current or  Ex-Partner: No  . Emotionally Abused: No  . Physically Abused: No  . Sexually Abused: No    Family History  Problem Relation Age of Onset  . Heart disease Mother   . Hyperlipidemia Mother   . Hypertension Mother   . Hypertension Father   . Osteoarthritis Father   . Stroke Father   . Alzheimer's disease Sister   . Alzheimer's disease Brother   . Brain cancer Brother   . Healthy Daughter   . Healthy Son   . Breast cancer Neg Hx      Current Outpatient Medications:  .  aspirin 325 MG tablet, Take 325 mg by mouth every 6 (six) hours as needed., Disp: , Rfl:  .  diclofenac Sodium (VOLTAREN) 1 % GEL, Apply 2 g topically 4 (four) times daily as needed., Disp: , Rfl:  .  EQ ALLERGY RELIEF, CETIRIZINE, 10 MG tablet, Take 1 tablet by mouth once daily, Disp: 90 tablet, Rfl: 0 .  FLUoxetine (PROZAC) 20 MG tablet, Take 2 tablets (40 mg total) by mouth daily., Disp: 60 tablet, Rfl: 3 .  fluticasone (FLONASE) 50 MCG/ACT nasal spray, Place 1 spray into both nostrils daily., Disp: 16 g, Rfl: 5 .  hydrochlorothiazide (HYDRODIURIL) 25 MG tablet, Take 1 tablet by mouth once daily, Disp: 90 tablet, Rfl: 0 .  hydrOXYzine (ATARAX/VISTARIL) 10 MG tablet, Take 1 tablet (10 mg total) by mouth 3 (three) times daily as needed for itching., Disp: 90 tablet, Rfl: 1 .  letrozole (FEMARA) 2.5 MG tablet, Take 1 tablet by mouth once daily, Disp: 90 tablet, Rfl: 0 .  Multiple Vitamin (MULTI-VITAMIN) tablet, Take 1 tablet by mouth daily. , Disp: , Rfl:  .  Omega-3 Fatty Acids (FISH OIL PO), Take by mouth daily., Disp: , Rfl:  .  ondansetron (ZOFRAN ODT) 4 MG disintegrating tablet, Allow 1-2 tablets to dissolve in your mouth every 8 hours as needed for nausea/vomiting, Disp: 30 tablet, Rfl: 0 .  pantoprazole (PROTONIX) 40 MG tablet, Take 1 tablet (40 mg total) by mouth daily., Disp: 90 tablet, Rfl: 2 .  rosuvastatin (CRESTOR) 40 MG tablet, Take 1 tablet (40 mg total) by mouth daily., Disp: 90 tablet, Rfl: 1 .  senna  (SENOKOT) 8.6 MG TABS tablet, Take 1 tablet by mouth at bedtime. , Disp: , Rfl:  .  SUMAtriptan (IMITREX) 20 MG/ACT nasal spray, Place 1 spray (20 mg total) into the nose as needed for migraine or headache. May repeat in 2 hours if headache persists or recurs., Disp: 1 Inhaler, Rfl: 2 .  traMADol (ULTRAM) 50 MG tablet, Take 1 tablet (50 mg total) by mouth every 12 (twelve) hours as needed., Disp: 30 tablet,  Rfl: 0 .  traZODone (DESYREL) 50 MG tablet, Take 0.5-1 tablets (25-50 mg total) by mouth at bedtime as needed for sleep., Disp: 30 tablet, Rfl: 3 .  tretinoin (RETIN-A) 0.05 % cream, Apply 1 application topically at bedtime., Disp: 45 g, Rfl: 0 .  trolamine salicylate (ASPERCREME) 10 % cream, Apply 1 application topically as needed for muscle pain., Disp: , Rfl:  .  celecoxib (CELEBREX) 100 MG capsule, Take 1 capsule (100 mg total) by mouth 2 (two) times daily as needed for moderate pain., Disp: 60 capsule, Rfl: 1  Physical exam:  Vitals:   03/28/20 1049  BP: (!) 142/82  Pulse: 78  Resp: 16  Temp: 98.5 F (36.9 C)  TempSrc: Tympanic  Weight: 181 lb 8 oz (82.3 kg)  Height: _0  (1.626 m)   Physical Exam Constitutional:      General: She is not in acute distress. Cardiovascular:     Rate and Rhythm: Normal rate and regular rhythm.     Heart sounds: Normal heart sounds.  Pulmonary:     Effort: Pulmonary effort is normal.     Breath sounds: Normal breath sounds.  Abdominal:     General: Bowel sounds are normal.     Palpations: Abdomen is soft.  Skin:    General: Skin is warm and dry.  Neurological:     Mental Status: She is alert and oriented to person, place, and time.     Breast exam was performed in seated and lying down position. Patient is status post left lumpectomy with a well-healed surgical scar. No evidence of any palpable masses. No evidence of axillary adenopathy. No evidence of any palpable masses or lumps in the right breast. No evidence of right axillary  adenopathy  CMP Latest Ref Rng & Units 02/23/2020  Glucose 70 - 99 mg/dL 98  BUN 6 - 23 mg/dL 19  Creatinine 0.40 - 1.20 mg/dL 0.74  Sodium 135 - 145 mEq/L 142  Potassium 3.5 - 5.1 mEq/L 3.4(L)  Chloride 96 - 112 mEq/L 100  CO2 19 - 32 mEq/L 32  Calcium 8.4 - 10.5 mg/dL 10.1  Total Protein 6.0 - 8.3 g/dL 7.1  Total Bilirubin 0.2 - 1.2 mg/dL 0.5  Alkaline Phos 39 - 117 U/L 55  AST 0 - 37 U/L 19  ALT 0 - 35 U/L 18   CBC Latest Ref Rng & Units 02/23/2020  WBC 4.0 - 10.5 K/uL 7.3  Hemoglobin 12.0 - 15.0 g/dL 13.3  Hematocrit 36 - 46 % 39.2  Platelets 150 - 400 K/uL 188.0     Assessment and plan- Patient is a 74 y.o. female with stage I left breast cancer ER positive s/p lumpectomy and adjuvant radiation treatment currently on letrozole here for routine follow-up  Patient has been having significant joint pain especially in her shoulders hands and knees.  Arthralgias could be a side effect of letrozole versus it could be her pre-existing osteoarthritis.  I have asked her to hold off on taking the letrozole for the next 3 to 4 weeks and see how she feels.  If her symptoms are significantly improved on stopping the drug we could consider switching her to some alternative medication.  However there is not much of a difference in her symptoms, it may be unlikely due to use of letrozole.  Patient verbalized understanding.    I will see her back in 6 months for an in person visit but sooner if she has any questions or concerns.  We  will schedule a diagnostic bilateral mammogram in July 2022   Visit Diagnosis 1. Encounter for follow-up surveillance of breast cancer   2. Use of letrozole (Femara)      Dr. Randa Evens, MD, MPH [facility] at [facility] [phone] 03/30/2020 3:48 PM

## 2020-04-04 ENCOUNTER — Ambulatory Visit (INDEPENDENT_AMBULATORY_CARE_PROVIDER_SITE_OTHER): Payer: PPO | Admitting: Vascular Surgery

## 2020-04-24 ENCOUNTER — Ambulatory Visit: Payer: PPO | Admitting: Family Medicine

## 2020-05-14 DIAGNOSIS — Z03818 Encounter for observation for suspected exposure to other biological agents ruled out: Secondary | ICD-10-CM | POA: Diagnosis not present

## 2020-05-14 DIAGNOSIS — Z1152 Encounter for screening for COVID-19: Secondary | ICD-10-CM | POA: Diagnosis not present

## 2020-05-20 ENCOUNTER — Encounter: Payer: Self-pay | Admitting: Family Medicine

## 2020-05-20 ENCOUNTER — Other Ambulatory Visit: Payer: Self-pay

## 2020-05-20 ENCOUNTER — Encounter: Payer: Self-pay | Admitting: Emergency Medicine

## 2020-05-20 ENCOUNTER — Other Ambulatory Visit: Payer: Self-pay | Admitting: Family Medicine

## 2020-05-20 ENCOUNTER — Ambulatory Visit
Admission: EM | Admit: 2020-05-20 | Discharge: 2020-05-20 | Disposition: A | Discharge status: Home / Self Care | Payer: PPO | Attending: Sports Medicine | Admitting: Sports Medicine

## 2020-05-20 DIAGNOSIS — R059 Cough, unspecified: Secondary | ICD-10-CM | POA: Insufficient documentation

## 2020-05-20 DIAGNOSIS — M2550 Pain in unspecified joint: Secondary | ICD-10-CM

## 2020-05-20 DIAGNOSIS — Z20822 Contact with and (suspected) exposure to covid-19: Secondary | ICD-10-CM | POA: Diagnosis not present

## 2020-05-20 DIAGNOSIS — B349 Viral infection, unspecified: Secondary | ICD-10-CM | POA: Diagnosis not present

## 2020-05-20 DIAGNOSIS — R0981 Nasal congestion: Secondary | ICD-10-CM | POA: Diagnosis not present

## 2020-05-20 LAB — RESP PANEL BY RT-PCR (FLU A&B, COVID) ARPGX2
Influenza A by PCR: NEGATIVE
Influenza B by PCR: NEGATIVE
SARS Coronavirus 2 by RT PCR: NEGATIVE

## 2020-05-20 MED ORDER — HYDROCHLOROTHIAZIDE 25 MG PO TABS
25.0000 mg | ORAL_TABLET | Freq: Every day | ORAL | 0 refills | Status: DC
Start: 2020-05-20 — End: 2020-09-24

## 2020-05-20 MED ORDER — PROMETHAZINE-PHENYLEPHRINE 6.25-5 MG/5ML PO SYRP: 5.0000 mL | ORAL_SOLUTION | ORAL | 0 refills | Status: AC | PRN

## 2020-05-20 NOTE — Discharge Instructions (Addendum)
You have received COVID testing today either for positive exposure, concerning symptoms that could be related to COVID infection, screening purposes, or re-testing after confirmed positive.  Your test obtained today checks for active viral infection in the last 1-2 weeks. If your test is negative now, you can still test positive later. So, if you do develop symptoms you should either get re-tested and/or isolate x 10 days. Please follow CDC guidelines.  While Rapid antigen tests come back in 15-20 minutes, send out PCR/molecular test results typically come back within 24 hours. In the mean time, if you are symptomatic, assume this could be a positive test and treat/monitor yourself as if you do have COVID.   We will call with test results. Please download the MyChart app and set up a profile to access test results.   If symptomatic, go home and rest. Push fluids. Take Tylenol as needed for discomfort. Gargle warm salt water. Throat lozenges. Take Mucinex DM or Robitussin for cough. Humidifier in bedroom to ease coughing. Warm showers. Also review the COVID handout for more information.  COVID-19 INFECTION: The incubation period of COVID-19 is approximately 14 days after exposure, with most symptoms developing in roughly 4-5 days. Symptoms may range in severity from mild to critically severe. Roughly 80% of those infected will have mild symptoms. People of any age may become infected with COVID-19 and have the ability to transmit the virus. The most common symptoms include: fever, fatigue, cough, body aches, headaches, sore throat, nasal congestion, shortness of breath, nausea, vomiting, diarrhea, changes in smell and/or taste.    COURSE OF ILLNESS Some patients may begin with mild disease which can progress quickly into critical symptoms. If your symptoms are worsening please call ahead to the Emergency Department and proceed there for further treatment. Recovery time appears to be roughly 1-2 weeks for  mild symptoms and 3-6 weeks for severe disease.   GO IMMEDIATELY TO ER FOR FEVER YOU ARE UNABLE TO GET DOWN WITH TYLENOL, BREATHING PROBLEMS, CHEST PAIN, FATIGUE, LETHARGY, INABILITY TO EAT OR DRINK, ETC  QUARANTINE AND ISOLATION: To help decrease the spread of COVID-19 please remain isolated if you have COVID infection or are highly suspected to have COVID infection. This means -stay home and isolate to one room in the home if you live with others. Do not share a bed or bathroom with others while ill, sanitize and wipe down all countertops and keep common areas clean and disinfected. You may discontinue isolation if you have a mild case and are asymptomatic 10 days after symptom onset as long as you have been fever free >24 hours without having to take Motrin or Tylenol. If your case is more severe (meaning you develop pneumonia or are admitted in the hospital), you may have to isolate longer.   If you have been in close contact (within 6 feet) of someone diagnosed with COVID 19, you are advised to quarantine in your home for 14 days as symptoms can develop anywhere from 2-14 days after exposure to the virus. If you develop symptoms, you  must isolate.  Most current guidelines for COVID after exposure -isolate 10 days if you ARE NOT tested for COVID as long as symptoms do not develop -isolate 7 days if you are tested and remain asymptomatic -You do not necessarily need to be tested for COVID if you have + exposure and        develop   symptoms. Just isolate at home x10 days from symptom onset During  this global pandemic, CDC advises to practice social distancing, try to stay at least 73ft away from others at all times. Wear a face covering. Wash and sanitize your hands regularly and avoid going anywhere that is not necessary.  KEEP IN MIND THAT THE COVID TEST IS NOT 100% ACCURATE AND YOU SHOULD STILL DO EVERYTHING TO PREVENT POTENTIAL SPREAD OF VIRUS TO OTHERS (WEAR MASK, WEAR GLOVES, [place] HANDS AND  SANITIZE REGULARLY). IF INITIAL TEST IS NEGATIVE, THIS MAY NOT MEAN YOU ARE DEFINITELY NEGATIVE. MOST ACCURATE TESTING IS DONE 5-7 DAYS AFTER EXPOSURE.   It is not advised by CDC to get re-tested after receiving a positive COVID test since you can still test positive for weeks to months after you have already cleared the virus.   *If you have not been vaccinated for COVID, I strongly suggest you consider getting vaccinated as long as there are no contraindications.

## 2020-05-20 NOTE — ED Triage Notes (Signed)
Pt c/o cough, nasal congestion, headache, subjective fever and chills. Started about a week ago. She states her grandson (that lives with her) was diagnosed with covid last week. She was tested the same day as he was and was negative.

## 2020-05-20 NOTE — ED Provider Notes (Signed)
[facility]    CSN: [id number] Arrival date & time: 05/20/20  1012      History   Chief Complaint Chief Complaint  Patient presents with   Cough   Nasal Congestion    HPI Dana Obrien is a 74 y.o. female presenting for 5-day history of cough, nasal congestion, headaches, feeling feverish, and chills.  Patient states that her grandson, who lives with her, tested positive for COVID-19 last week.  She had a negative COVID-19 test 8 days ago.  Patient is fully vaccinated for COVID-19.  Patient states that she feels like her symptoms are getting worse every day.  Has been taking over-the-counter Mucinex and Robitussin with occasional improvement in symptoms.  She denies any weakness, chest pain, breathing difficulty or wheezing.  Patient states she has been told in the past she had asthma, but that her last checkup with her PCP, he did not believe she had asthma.  Has not needed to use an inhaler.  Denies any history of COPD.  Other past medical history does include hypertension and breast cancer in 2019.  Patient has no other complaints or concerns.  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   Cancer [facility]) 2004   right lumpectomy, chemotherapy and radiation Dr. Jeb Levering   CPAP (continuous positive airway pressure) dependence    Depression    GERD (gastroesophageal reflux disease)    Hypertension    Migraine    Personal history of chemotherapy 2004   Personal history of radiation therapy 2004   Personal history of radiation therapy 2019   PONV (postoperative nausea and vomiting)    Shingles    Sleep apnea    uses CPAP, severe OSA   Vitamin D deficiency    IN THE PAST    Patient  Active Problem List   Diagnosis Date Noted   CPAP use counseling 03/25/2020   Gastroesophageal reflux disease without esophagitis 03/25/2020   OSA on CPAP 03/25/2020   Dermatitis due to plants, including poison ivy, sumac, and oak 02/29/2020   Neuropathy 12/15/2019   Allergic rhinitis 07/05/2019   Brain atrophy ([place]) 05/18/2019   Bilateral hearing loss 08/15/2018   Decreased energy 08/15/2018   Chest tightness 06/02/2018   Prediabetes 06/02/2018   Arthralgia 06/02/2018   Congestion of both ears 06/02/2018   BPPV (benign paroxysmal positional vertigo), right 03/11/2018   Dysfunction of right eustachian tube 03/11/2018   Malignant neoplasm of upper-outer quadrant of left breast in female, estrogen receptor positive ([place]) 09/14/2017   Radiculopathy 08/24/2017   Fatty liver 08/24/2017   Abdominal pain 08/24/2017   Hearing difficulty of both ears 06/25/2017   Chronic sinusitis 11/05/2016   Bilateral sensorineural hearing loss 10/13/2016   Right ear impacted cerumen 10/13/2016   GERD (gastroesophageal reflux disease) 09/07/2016   Cough 07/08/2016   Exertional shortness of breath 01/15/2016   Uterine prolapse 11/14/2015   OSA (obstructive sleep apnea) 11/01/2015   Back muscle spasm 04/30/2015   DDD (degenerative disc disease), lumbar 04/30/2015   Lumbar radiculitis 04/30/2015   Low back pain 01/01/2015   Varicose vein of leg 11/22/2014   Post herpetic neuralgia 03/06/2014   Increased frequency  of urination 11/17/2013   Rectocele 11/17/2013   Cystocele 10/25/2013   Skin lesion 10/25/2013   Disorder of skin and subcutaneous tissue 10/25/2013   Midline cystocele 10/25/2013   Pelvic and perineal pain 10/25/2013   Obesity, unspecified 10/19/2012   History of breast cancer 02/11/2012   Hyperlipidemia 05/11/2011   Osteopenia 05/11/2011   Disorder of bone and cartilage 05/11/2011   Anxiety and depression 03/04/2011   Hypertension  03/04/2011    Past Surgical History:  Procedure Laterality Date   ABDOMINAL HYSTERECTOMY     ANTERIOR AND POSTERIOR REPAIR     BREAST BIOPSY Right 2004   stereo. 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  3.5 cm maximum diameter, minimal margins 5 mm.  In situ component less than 10%.   BREAST LUMPECTOMY Right 2004   BREAST LUMPECTOMY Left 09/17/2017   Procedure: BREAST EXCISION, SENTINEL NODE BIOPSY;  Surgeon: Robert Bellow, MD;  Location: [facility];  Service: General;  Laterality: Left;   COLONOSCOPY  2015   COLONOSCOPY WITH PROPOFOL N/A 10/04/2019   Procedure: COLONOSCOPY WITH PROPOFOL;  Surgeon: Robert Bellow, MD;  Location: [facility];  Service: Endoscopy;  Laterality: N/A;   DILATION AND CURETTAGE OF UTERUS     ETHMOIDECTOMY Bilateral 11/05/2016   Procedure: ETHMOIDECTOMY;  Surgeon: Melida Quitter, MD;  Location: [place];  Service: ENT;  Laterality: Bilateral;   LAPAROTOMY     MAXILLARY ANTROSTOMY Bilateral 11/05/2016   Procedure: MAXILLARY ANTROSTOMY;  Surgeon: Melida Quitter, MD;  Location: [place];  Service: ENT;  Laterality: Bilateral;   SINUS ENDO W/FUSION Bilateral 11/05/2016   Procedure: FRONTAL RECESS EXPLORATION;  Surgeon: Melida Quitter, MD;  Location: [place];  Service: ENT;  Laterality: Bilateral;  BILATERAL ENDOSCOPIC SINUS SURGERY WITH FUSION   SINUS EXPLORATION  11/05/2016   SPHENOIDECTOMY Bilateral 11/05/2016   Procedure: SPHENOIDECTOMY;  Surgeon: Melida Quitter, MD;  Location: [facility];  Service: ENT;  Laterality: Bilateral;    OB History    Gravida  2   Para      Term      Preterm      AB      Living        SAB      IAB      Ectopic      Multiple      Live Births  2        Obstetric Comments  1st Menstrual Cycle:  15 1st Pregnancy: 31            Home Medications    Prior to Admission medications   Medication Sig Start Date End Date Taking? Authorizing Provider  celecoxib (CELEBREX) 100 MG capsule Take 1 capsule (100 mg total) by mouth 2 (two) times daily as needed for moderate pain. 03/28/20  Yes Leone Haven, MD  diclofenac Sodium (VOLTAREN) 1 % GEL Apply 2 g topically 4 (four) times daily as needed.   Yes [provider]  EQ ALLERGY RELIEF, CETIRIZINE, 10 MG tablet Take 1 tablet by mouth once daily 03/21/20  Yes Leone Haven, MD  FLUoxetine (PROZAC) 20 MG tablet Take 2 tablets (40 mg total) by mouth daily. 03/26/20  Yes Leone Haven, MD  fluticasone (FLONASE) 50 MCG/ACT nasal spray Place 1 spray into both nostrils daily. 08/16/18  Yes Guse, Jacquelynn Cree, FNP  hydrochlorothiazide (  HYDRODIURIL) 25 MG tablet Take 1 tablet (25 mg total) by mouth daily. 05/20/20  Yes Leone Haven, MD  hydrOXYzine (ATARAX/VISTARIL) 10 MG tablet Take 1 tablet (10 mg total) by mouth 3 (three) times daily as needed for itching. 08/16/18  Yes Guse, Jacquelynn Cree, FNP  letrozole [facility]) 2.5 MG tablet Take 1 tablet by mouth once daily 09/11/19  Yes Sindy Guadeloupe, MD  Multiple Vitamin (MULTI-VITAMIN) tablet Take 1 tablet by mouth daily.    Yes [provider]  Omega-3 Fatty Acids (FISH OIL PO) Take by mouth daily.   Yes [provider]  ondansetron (ZOFRAN ODT) 4 MG disintegrating tablet Allow 1-2 tablets to dissolve in your mouth every 8 hours as needed for nausea/vomiting 04/10/19  Yes Hinda Kehr, MD  pantoprazole (PROTONIX) 40 MG tablet Take 1 tablet (40 mg total) by mouth daily. 10/06/19  Yes Leone Haven, MD  rosuvastatin (CRESTOR) 40 MG tablet Take 1 tablet (40 mg total) by mouth daily. 01/10/20  Yes Leone Haven, MD  senna (SENOKOT) 8.6 MG TABS tablet Take 1 tablet by mouth at bedtime.    Yes [provider]  SUMAtriptan (IMITREX) 20 MG/ACT nasal spray Place 1 spray (20 mg total) into the  nose as needed for migraine or headache. May repeat in 2 hours if headache persists or recurs. 04/10/19 04/09/20 Yes Hinda Kehr, MD  traZODone (DESYREL) 50 MG tablet Take 0.5-1 tablets (25-50 mg total) by mouth at bedtime as needed for sleep. 12/15/19  Yes Leone Haven, MD  trolamine salicylate (ASPERCREME) 10 % cream Apply 1 application topically as needed for muscle pain.   Yes [provider]  aspirin 325 MG tablet Take 325 mg by mouth every 6 (six) hours as needed.    [provider]  promethazine-phenylephrine (PROMETHAZINE VC) 6.25-5 MG/5ML SYRP Take 5 mLs by mouth every 4 (four) hours as needed for up to 7 days for congestion. 05/20/20 05/27/20  Danton Clap, PA-C  traMADol (ULTRAM) 50 MG tablet Take 1 tablet (50 mg total) by mouth every 12 (twelve) hours as needed. 02/23/20   Leone Haven, MD  tretinoin (RETIN-A) 0.05 % cream Apply 1 application topically at bedtime. 05/31/18   Leone Haven, MD    Family History Family History  Problem Relation Age of Onset   Heart disease Mother    Hyperlipidemia Mother    Hypertension Mother    Hypertension Father    Osteoarthritis Father    Stroke Father    Alzheimer's disease Sister    Alzheimer's disease Brother    Brain cancer Brother    Healthy Daughter    Healthy Son    Breast cancer Neg Hx     Social History Social History   Tobacco Use   Smoking status: Never Smoker   Smokeless tobacco: Never Used  Scientific laboratory technician Use: Never used  Substance Use Topics   Alcohol use: Yes    Comment: rarely   Drug use: No     Allergies   Lidocaine, Codeine, Morphine, Niacin, and Niacin and related   Review of Systems Review of Systems  Constitutional: Positive for chills and fever. Negative for diaphoresis and fatigue.  HENT: Positive for congestion, rhinorrhea and sore throat. Negative for ear pain, sinus pressure and sinus pain.   Respiratory: Positive for cough. Negative for  shortness of breath and wheezing.   Cardiovascular: Negative for chest pain.  Gastrointestinal: Negative for abdominal pain, nausea and vomiting.  Musculoskeletal: Negative for arthralgias and myalgias.  Skin: Negative for rash.  Neurological: Positive for headaches. Negative for weakness.  Hematological: Negative for adenopathy.     Physical Exam Triage Vital Signs ED Triage Vitals  Enc Vitals Group     BP 05/20/20 1113 121/69     Pulse Rate 05/20/20 1113 71     Resp 05/20/20 1113 20     Temp 05/20/20 1113 98.5 F (36.9 C)     Temp Source 05/20/20 1113 Oral     SpO2 05/20/20 1113 98 %     Weight 05/20/20 1110 181 lb 7 oz (82.3 kg)     Height 05/20/20 1110 5' 4"  (1.626 m)     Head Circumference --      Peak Flow --      Pain Score 05/20/20 1110 6     Pain Loc --      Pain Edu? --      Excl. in [place]? --    No data found.  Updated Vital Signs BP 121/69 (BP Location: Left Arm)    Pulse 71    Temp 98.5 F (36.9 C) (Oral)    Resp 20    Ht 5' 4"  (1.626 m)    Wt 181 lb 7 oz (82.3 kg)    SpO2 98%    BMI 31.14 kg/m       Physical Exam Vitals and nursing note reviewed.  Constitutional:      General: She is not in acute distress.    Appearance: Normal appearance. She is ill-appearing. She is not toxic-appearing.  HENT:     Head: Normocephalic and atraumatic.     Nose: Congestion and rhinorrhea (trace light yellowish drainage) present.     Mouth/Throat:     Mouth: Mucous membranes are moist.     Pharynx: Oropharynx is clear. Posterior oropharyngeal erythema (mild with clear PND) present.  Eyes:     General: No scleral icterus.       Right eye: No discharge.        Left eye: No discharge.     Conjunctiva/sclera: Conjunctivae normal.  Cardiovascular:     Rate and Rhythm: Normal rate and regular rhythm.     Heart sounds: Normal heart sounds.  Pulmonary:     Effort: Pulmonary effort is normal. No respiratory distress.     Breath sounds: Normal breath sounds. No wheezing,  rhonchi or rales.  Musculoskeletal:     Cervical back: Neck supple.  Skin:    General: Skin is dry.  Neurological:     General: No focal deficit present.     Mental Status: She is alert. Mental status is at baseline.     Motor: No weakness.     Gait: Gait normal.  Psychiatric:        Mood and Affect: Mood normal.        Behavior: Behavior normal.        Thought Content: Thought content normal.      UC Treatments / Results  Labs (all labs ordered are listed, but only abnormal results are displayed) Labs Reviewed  RESP PANEL BY RT-PCR (FLU A&B, COVID) [id number]    EKG   Radiology No results found.  Procedures Procedures (including critical care time)  Medications Ordered in UC Medications - No data to display  Initial Impression / Assessment and Plan / UC Course  I have reviewed the triage vital signs and the nursing notes.  Pertinent labs & imaging results that were available  during my care of the patient were reviewed by me and considered in my medical decision making (see chart for details).   74 year old female with history of cough, congestion, sore throat, fatigue, feeling feverish and chills x5 days with positive COVID-19 exposure.  All vital signs are stable.  She is in no acute distress.  Exam is significant for nasal congestion and rhinorrhea and mild posterior pharyngeal erythema.  Chest is clear to auscultation heart regular rate and rhythm.  Suspect viral illness, possibly COVID-19 given her history of exposure.  Advised continuing supportive care with increasing rest and fluids.  Sent Promethazine VC to pharmacy.  I did offer Cheratussin, but she has a allergy to codeine.  CDC guidelines, isolation protocol, and ED precautions discussed.  Respiratory panel is all negative.  Call to discuss results with patient, but no answer.  Left voicemail for her to call back to discuss results.  Final Clinical Impressions(s) / UC Diagnoses   Final diagnoses:  Viral  illness  Cough  Nasal congestion  Exposure to COVID-19 virus     Discharge Instructions     You have received COVID testing today either for positive exposure, concerning symptoms that could be related to COVID infection, screening purposes, or re-testing after confirmed positive.  Your test obtained today checks for active viral infection in the last 1-2 weeks. If your test is negative now, you can still test positive later. So, if you do develop symptoms you should either get re-tested and/or isolate x 10 days. Please follow CDC guidelines.  While Rapid antigen tests come back in 15-20 minutes, send out PCR/molecular test results typically come back within 24 hours. In the mean time, if you are symptomatic, assume this could be a positive test and treat/monitor yourself as if you do have COVID.   We will call with test results. Please download the MyChart app and set up a profile to access test results.   If symptomatic, go home and rest. Push fluids. Take Tylenol as needed for discomfort. Gargle warm salt water. Throat lozenges. Take Mucinex DM or Robitussin for cough. Humidifier in bedroom to ease coughing. Warm showers. Also review the COVID handout for more information.  COVID-19 INFECTION: The incubation period of COVID-19 is approximately 14 days after exposure, with most symptoms developing in roughly 4-5 days. Symptoms may range in severity from mild to critically severe. Roughly 80% of those infected will have mild symptoms. People of any age may become infected with COVID-19 and have the ability to transmit the virus. The most common symptoms include: fever, fatigue, cough, body aches, headaches, sore throat, nasal congestion, shortness of breath, nausea, vomiting, diarrhea, changes in smell and/or taste.    COURSE OF ILLNESS Some patients may begin with mild disease which can progress quickly into critical symptoms. If your symptoms are worsening please call ahead to the Emergency  Department and proceed there for further treatment. Recovery time appears to be roughly 1-2 weeks for mild symptoms and 3-6 weeks for severe disease.   GO IMMEDIATELY TO ER FOR FEVER YOU ARE UNABLE TO GET DOWN WITH TYLENOL, BREATHING PROBLEMS, CHEST PAIN, FATIGUE, LETHARGY, INABILITY TO EAT OR DRINK, ETC  QUARANTINE AND ISOLATION: To help decrease the spread of COVID-19 please remain isolated if you have COVID infection or are highly suspected to have COVID infection. This means -stay home and isolate to one room in the home if you live with others. Do not share a bed or bathroom with others while ill, sanitize  and wipe down all countertops and keep common areas clean and disinfected. You may discontinue isolation if you have a mild case and are asymptomatic 10 days after symptom onset as long as you have been fever free >24 hours without having to take Motrin or Tylenol. If your case is more severe (meaning you develop pneumonia or are admitted in the hospital), you may have to isolate longer.   If you have been in close contact (within 6 feet) of someone diagnosed with COVID 19, you are advised to quarantine in your home for 14 days as symptoms can develop anywhere from 2-14 days after exposure to the virus. If you develop symptoms, you  must isolate.  Most current guidelines for COVID after exposure -isolate 10 days if you ARE NOT tested for COVID as long as symptoms do not develop -isolate 7 days if you are tested and remain asymptomatic -You do not necessarily need to be tested for COVID if you have + exposure and        develop   symptoms. Just isolate at home x10 days from symptom onset During this global pandemic, CDC advises to practice social distancing, try to stay at least 29f away from others at all times. Wear a face covering. Wash and sanitize your hands regularly and avoid going anywhere that is not necessary.  KEEP IN MIND THAT THE COVID TEST IS NOT 100% ACCURATE AND YOU SHOULD STILL DO  EVERYTHING TO PREVENT POTENTIAL SPREAD OF VIRUS TO OTHERS (WEAR MASK, WEAR GLOVES, WLake CityHANDS AND SANITIZE REGULARLY). IF INITIAL TEST IS NEGATIVE, THIS MAY NOT MEAN YOU ARE DEFINITELY NEGATIVE. MOST ACCURATE TESTING IS DONE 5-7 DAYS AFTER EXPOSURE.   It is not advised by CDC to get re-tested after receiving a positive COVID test since you can still test positive for weeks to months after you have already cleared the virus.   *If you have not been vaccinated for COVID, I strongly suggest you consider getting vaccinated as long as there are no contraindications.      ED Prescriptions    Medication Sig Dispense Auth. Provider   promethazine-phenylephrine (PROMETHAZINE VC) 6.25-5 MG/5ML SYRP Take 5 mLs by mouth every 4 (four) hours as needed for up to 7 days for congestion. 150 mL EDanton Clap PA-C     PDMP not reviewed this encounter.   EDanton Clap PA-C 05/20/20 1342

## 2020-05-21 MED ORDER — TRAMADOL HCL 50 MG PO TABS: 50.0000 mg | ORAL_TABLET | Freq: Two times a day (BID) | ORAL | 0 refills | Status: DC | PRN

## 2020-05-22 ENCOUNTER — Ambulatory Visit: Payer: PPO | Admitting: Family Medicine

## 2020-05-29 ENCOUNTER — Encounter: Payer: Self-pay | Admitting: Oncology

## 2020-05-30 ENCOUNTER — Other Ambulatory Visit: Payer: Self-pay | Admitting: *Deleted

## 2020-05-30 MED ORDER — EXEMESTANE 25 MG PO TABS: 25.0000 mg | ORAL_TABLET | Freq: Every day | ORAL | 2 refills | Status: DC

## 2020-05-30 NOTE — Telephone Encounter (Signed)
We should give her aromasin if she has not tried that

## 2020-06-18 ENCOUNTER — Encounter: Payer: Self-pay | Admitting: Family Medicine

## 2020-06-19 MED ORDER — ALBUTEROL SULFATE HFA 108 (90 BASE) MCG/ACT IN AERS: 2.0000 | INHALATION_SPRAY | Freq: Four times a day (QID) | RESPIRATORY_TRACT | 0 refills | Status: DC | PRN

## 2020-06-19 NOTE — Telephone Encounter (Signed)
Please call this patient and triage her. Please see if she is having shortness of breath, chest pain, fevers, or other symptoms. Has she had an albuterol inhaler previously? She should at least complete a virtual visit to evaluate further.

## 2020-06-20 MED ORDER — SUMATRIPTAN 20 MG/ACT NA SOLN: 1.0000 | NASAL | 2 refills | Status: DC | PRN

## 2020-07-15 ENCOUNTER — Other Ambulatory Visit: Payer: Self-pay

## 2020-07-15 ENCOUNTER — Encounter: Payer: Self-pay | Admitting: Emergency Medicine

## 2020-07-15 ENCOUNTER — Ambulatory Visit
Admission: EM | Admit: 2020-07-15 | Discharge: 2020-07-15 | Disposition: A | Discharge status: Home / Self Care | Payer: PPO

## 2020-07-15 ENCOUNTER — Emergency Department: Admission: EM | Admit: 2020-07-15 | Discharge: 2020-07-15 | Discharge status: Absent without leave | Payer: PPO

## 2020-07-15 ENCOUNTER — Telehealth: Payer: Self-pay | Admitting: Family Medicine

## 2020-07-15 DIAGNOSIS — R519 Headache, unspecified: Secondary | ICD-10-CM | POA: Diagnosis not present

## 2020-07-15 DIAGNOSIS — S0990XA Unspecified injury of head, initial encounter: Secondary | ICD-10-CM

## 2020-07-15 DIAGNOSIS — R110 Nausea: Secondary | ICD-10-CM

## 2020-07-15 NOTE — Telephone Encounter (Signed)
Pt was seen at [facility] today for a head injury and a fall. Pt daughter called and said they told her that she needed to go to the ED for the CT scan. Pt daughter said that she would take her back to UC and she if they could do imaging  There.  [facility]

## 2020-07-15 NOTE — ED Triage Notes (Signed)
Patient states she fell one week ago standing on a kitchen chair. She states she fell hitting her head. She was not seen for this. She states she has had a headache since then. She states if she is up and around a lot she has episodes of nausea. Denies vomiting, denies visual changes.

## 2020-07-15 NOTE — ED Provider Notes (Signed)
[facility]    CSN: [id number] Arrival date & time: 07/15/20  1039      History   Chief Complaint Chief Complaint  Patient presents with   Fall   Headache    HPI Dana Obrien is a 75 y.o. female presenting for headaches (about 7/10 in severity) for 1 week. She says 1 week ago she fell while standing on a chair and her head hit ceramic tile. Admits to no improvement in headaches and has associated nausea and fatigue.  She denies any loss of consciousness.  She says that she feels headaches constantly and they feel like a tightness of her entire head.  She says that after she fell she noticed a swollen lump of the right part of her head.  She says the swelling is gone down a little though still hurts in this area to touch.  Patient states that whenever she is up and moving around she will get dizzy and feel very nauseous.  She denies any vomiting.  Denies any vision changes, numbness, tingling or weakness.  No balance problems or confusion.  She does admit to difficulty sleeping at night, but this is nothing new.  Patient has taken over-the-counter medication for her headache without relief.  She has no history of headaches.  She is not taking any anticoagulant medication.  Patient concern for persistent headaches and not gotten any better over a week's time.  She has no other concerns.  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   Cancer [facility]) 2004   right lumpectomy, chemotherapy and radiation Dr. Jeb Levering   CPAP (continuous positive airway pressure) dependence    Depression    GERD (gastroesophageal reflux disease)    Hypertension    Migraine    Personal history of chemotherapy 2004    Personal history of radiation therapy 2004   Personal history of radiation therapy 2019   PONV (postoperative nausea and vomiting)    Shingles    Sleep apnea    uses CPAP, severe OSA   Vitamin D deficiency    IN THE PAST    Patient Active Problem List   Diagnosis Date Noted   CPAP use counseling 03/25/2020   Gastroesophageal reflux disease without esophagitis 03/25/2020   OSA on CPAP 03/25/2020   Dermatitis due to plants, including poison ivy, sumac, and oak 02/29/2020   Neuropathy 12/15/2019   Allergic rhinitis 07/05/2019   Brain atrophy ([place]) 05/18/2019   Bilateral hearing loss 08/15/2018   Decreased energy 08/15/2018   Chest tightness 06/02/2018   Prediabetes 06/02/2018   Arthralgia 06/02/2018   Congestion of both ears 06/02/2018   BPPV (benign paroxysmal positional vertigo), right 03/11/2018   Dysfunction of right eustachian tube 03/11/2018   Malignant neoplasm of upper-outer quadrant of left breast in female, estrogen receptor positive ([place]) 09/14/2017   Radiculopathy 08/24/2017   Fatty liver 08/24/2017   Abdominal pain 08/24/2017   Hearing difficulty of both ears 06/25/2017   Chronic sinusitis 11/05/2016   Bilateral sensorineural hearing loss 10/13/2016   Right ear impacted cerumen 10/13/2016   GERD (gastroesophageal reflux disease) 09/07/2016   Cough 07/08/2016   Exertional shortness of breath 01/15/2016  Uterine prolapse 11/14/2015   OSA (obstructive sleep apnea) 11/01/2015   Back muscle spasm 04/30/2015   DDD (degenerative disc disease), lumbar 04/30/2015   Lumbar radiculitis 04/30/2015   Low back pain 01/01/2015   Varicose vein of leg 11/22/2014   Post herpetic neuralgia 03/06/2014   Increased frequency of urination 11/17/2013   Rectocele 11/17/2013   Cystocele 10/25/2013   Skin lesion 10/25/2013   Disorder of skin and subcutaneous tissue 10/25/2013   Midline cystocele 10/25/2013   Pelvic and perineal pain  10/25/2013   Obesity, unspecified 10/19/2012   History of breast cancer 02/11/2012   Hyperlipidemia 05/11/2011   Osteopenia 05/11/2011   Disorder of bone and cartilage 05/11/2011   Anxiety and depression 03/04/2011   Hypertension 03/04/2011    Past Surgical History:  Procedure Laterality Date   ABDOMINAL HYSTERECTOMY     ANTERIOR AND POSTERIOR REPAIR     BREAST BIOPSY Right 2004   stereo. 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  3.5 cm maximum diameter, minimal margins 5 mm.  In situ component less than 10%.   BREAST LUMPECTOMY Right 2004   BREAST LUMPECTOMY Left 09/17/2017   Procedure: BREAST EXCISION, SENTINEL NODE BIOPSY;  Surgeon: Robert Bellow, MD;  Location: [facility];  Service: General;  Laterality: Left;   COLONOSCOPY  2015   COLONOSCOPY WITH PROPOFOL N/A 10/04/2019   Procedure: COLONOSCOPY WITH PROPOFOL;  Surgeon: Robert Bellow, MD;  Location: [facility];  Service: Endoscopy;  Laterality: N/A;   DILATION AND CURETTAGE OF UTERUS     ETHMOIDECTOMY Bilateral 11/05/2016   Procedure: ETHMOIDECTOMY;  Surgeon: Melida Quitter, MD;  Location: [place];  Service: ENT;  Laterality: Bilateral;   LAPAROTOMY     MAXILLARY ANTROSTOMY Bilateral 11/05/2016   Procedure: MAXILLARY ANTROSTOMY;  Surgeon: Melida Quitter, MD;  Location: [place];  Service: ENT;  Laterality: Bilateral;   SINUS ENDO W/FUSION Bilateral 11/05/2016   Procedure: FRONTAL RECESS EXPLORATION;  Surgeon: Melida Quitter, MD;  Location: [place];  Service: ENT;  Laterality: Bilateral;  BILATERAL ENDOSCOPIC SINUS SURGERY WITH FUSION   SINUS EXPLORATION  11/05/2016   SPHENOIDECTOMY Bilateral 11/05/2016   Procedure: SPHENOIDECTOMY;  Surgeon: Melida Quitter, MD;  Location: [facility];  Service: ENT;  Laterality: Bilateral;    OB  History    Gravida  2   Para      Term      Preterm      AB      Living        SAB      IAB      Ectopic      Multiple      Live Births  2        Obstetric Comments  1st Menstrual Cycle:  15 1st Pregnancy: 31           Home Medications    Prior to Admission medications   Medication Sig Start Date End Date Taking? Authorizing Provider  albuterol (VENTOLIN HFA) 108 (90 Base) MCG/ACT inhaler Inhale 2 puffs into the lungs every 6 (six) hours as needed for wheezing or shortness of breath. 06/19/20  Yes Leone Haven, MD  celecoxib (CELEBREX) 100 MG capsule Take 1 capsule (100 mg total) by mouth 2 (two) times daily as needed for moderate pain. 03/28/20  Yes Leone Haven, MD  diclofenac Sodium (VOLTAREN)  1 % GEL Apply 2 g topically 4 (four) times daily as needed.   Yes [provider]  EQ ALLERGY RELIEF, CETIRIZINE, 10 MG tablet Take 1 tablet by mouth once daily 03/21/20  Yes Leone Haven, MD  exemestane (AROMASIN) 25 MG tablet Take 1 tablet (25 mg total) by mouth daily after breakfast. 05/30/20  Yes Sindy Guadeloupe, MD  FLUoxetine (PROZAC) 20 MG tablet Take 2 tablets (40 mg total) by mouth daily. 03/26/20  Yes Leone Haven, MD  fluticasone (FLONASE) 50 MCG/ACT nasal spray Place 1 spray into both nostrils daily. 08/16/18  Yes Guse, Jacquelynn Cree, FNP  hydrochlorothiazide (HYDRODIURIL) 25 MG tablet Take 1 tablet (25 mg total) by mouth daily. 05/20/20  Yes Leone Haven, MD  hydrOXYzine (ATARAX/VISTARIL) 10 MG tablet Take 1 tablet (10 mg total) by mouth 3 (three) times daily as needed for itching. 08/16/18  Yes Guse, Jacquelynn Cree, FNP  Multiple Vitamin (MULTI-VITAMIN) tablet Take 1 tablet by mouth daily.    Yes [provider]  Omega-3 Fatty Acids (FISH OIL PO) Take by mouth daily.   Yes [provider]  ondansetron (ZOFRAN ODT) 4 MG disintegrating tablet Allow 1-2 tablets to dissolve in your mouth every 8 hours as needed for  nausea/vomiting 04/10/19  Yes Hinda Kehr, MD  pantoprazole (PROTONIX) 40 MG tablet Take 1 tablet (40 mg total) by mouth daily. 10/06/19  Yes Leone Haven, MD  rosuvastatin (CRESTOR) 40 MG tablet Take 1 tablet (40 mg total) by mouth daily. 01/10/20  Yes Leone Haven, MD  senna (SENOKOT) 8.6 MG TABS tablet Take 1 tablet by mouth at bedtime.    Yes [provider]  SUMAtriptan (IMITREX) 20 MG/ACT nasal spray Place 1 spray (20 mg total) into the nose as needed for migraine or headache. May repeat in 2 hours if headache persists or recurs. 06/20/20 06/20/21 Yes Leone Haven, MD  traMADol (ULTRAM) 50 MG tablet Take 1 tablet (50 mg total) by mouth every 12 (twelve) hours as needed. 05/21/20  Yes Leone Haven, MD  traZODone (DESYREL) 50 MG tablet Take 0.5-1 tablets (25-50 mg total) by mouth at bedtime as needed for sleep. 12/15/19  Yes Leone Haven, MD  tretinoin (RETIN-A) 0.05 % cream Apply 1 application topically at bedtime. 05/31/18  Yes Leone Haven, MD  trolamine salicylate (ASPERCREME) 10 % cream Apply 1 application topically as needed for muscle pain.   Yes [provider]  aspirin 325 MG tablet Take 325 mg by mouth every 6 (six) hours as needed.    [provider]    Family History Family History  Problem Relation Age of Onset   Heart disease Mother    Hyperlipidemia Mother    Hypertension Mother    Hypertension Father    Osteoarthritis Father    Stroke Father    Alzheimer's disease Sister    Alzheimer's disease Brother    Brain cancer Brother    Healthy Daughter    Healthy Son    Breast cancer Neg Hx     Social History Social History   Tobacco Use   Smoking status: Never Smoker   Smokeless tobacco: Never Used  Scientific laboratory technician Use: Never used  Substance Use Topics   Alcohol use: Yes    Comment: rarely   Drug use: No     Allergies   Lidocaine, Codeine, Morphine, Niacin, and Niacin and  related   Review of Systems Review of Systems  Constitutional: Positive for fatigue. Negative for fever.  Eyes: Negative for photophobia and visual disturbance.  Gastrointestinal: Positive for nausea. Negative for vomiting.  Musculoskeletal: Positive for neck pain. Negative for back pain, gait problem and neck stiffness.  Skin: Negative for wound.  Neurological: Positive for dizziness and headaches. Negative for seizures, syncope, facial asymmetry, speech difficulty, weakness and numbness.  Hematological: Does not bruise/bleed easily.  Psychiatric/Behavioral: Positive for sleep disturbance. Negative for confusion and dysphoric mood. The patient is not nervous/anxious.      Physical Exam Triage Vital Signs ED Triage Vitals [07/15/20 1112]  Enc Vitals Group     BP (!) 151/87     Pulse Rate 87     Resp 18     Temp 98.2 F (36.8 C)     Temp Source Oral     SpO2 97 %     Weight      Height      Head Circumference      Peak Flow      Pain Score 8     Pain Loc      Pain Edu?      Excl. in [place]?    No data found.  Updated Vital Signs BP (!) 151/87 (BP Location: Right Arm)    Pulse 87    Temp 98.2 F (36.8 C) (Oral)    Resp 18    SpO2 97%       Physical Exam Vitals and nursing note reviewed.  Constitutional:      General: She is not in acute distress.    Appearance: Normal appearance. She is well-developed. She is not ill-appearing or toxic-appearing.  HENT:     Head:     Comments: Small hematoma right parietal region    Right Ear: Tympanic membrane, ear canal and external ear normal.     Left Ear: Tympanic membrane, ear canal and external ear normal.     Nose: Nose normal.     Mouth/Throat:     Mouth: Mucous membranes are moist.     Pharynx: Oropharynx is clear.  Eyes:     General: No scleral icterus.       Right eye: No discharge.        Left eye: No discharge.     Extraocular Movements: Extraocular movements intact.     Conjunctiva/sclera: Conjunctivae normal.      Pupils: Pupils are equal, round, and reactive to light.  Cardiovascular:     Rate and Rhythm: Normal rate and regular rhythm.     Heart sounds: Normal heart sounds.  Pulmonary:     Effort: Pulmonary effort is normal. No respiratory distress.     Breath sounds: Normal breath sounds.  Musculoskeletal:     Cervical back: Neck supple. Tenderness (mild diffuse TTP cervical muscles, no spinal tenderness) present.  Skin:    General: Skin is dry.  Neurological:     General: No focal deficit present.     Mental Status: She is alert and oriented to person, place, and time. Mental status is at baseline.     Cranial Nerves: No cranial nerve deficit.     Sensory: Sensation is intact. No sensory deficit ().     Motor: No weakness.     Coordination: Coordination normal. Finger-Nose-Finger Test normal.     Gait: Gait normal.  Psychiatric:        Mood and Affect: Mood normal.        Behavior: Behavior normal.  Thought Content: Thought content normal.      UC Treatments / Results  Labs (all labs ordered are listed, but only abnormal results are displayed) Labs Reviewed - No data to display  EKG   Radiology No results found.  Procedures Procedures (including critical care time)  Medications Ordered in UC Medications - No data to display  Initial Impression / Assessment and Plan / UC Course  I have reviewed the triage vital signs and the nursing notes.  Pertinent labs & imaging results that were available during my care of the patient were reviewed by me and considered in my medical decision making (see chart for details).   75 year old female presenting for 1 week history of continued moderate to severe headaches with associated nausea, dizziness, fatigue and sleep disturbances.  This was a traumatic fall.  She denied any loss of consciousness.  Blood pressure is little elevated at 151/87 in the clinic.  Exam significant for small hematoma of the right parietal region and  tenderness throughout the cervical region.  Neurological exam is normal.  Based on duration of her headaches and other associated symptoms of dizziness, nausea, increased fatigue and sleep disturbances advised patient that she needs to have a CT scan performed at this time.  We do not have ability to perform head CT in the clinic today so I advise she go to the emergency department.  Patient declines EMS transport and states that her daughter will take her to [place] at this time.  Patient leaving in stable condition.  Final Clinical Impressions(s) / UC Diagnoses   Final diagnoses:  Traumatic injury of head, initial encounter  Nausea without vomiting  Bad headache     Discharge Instructions     You have been advised to follow up immediately in the emergency department for concerning signs.symptoms. If you declined EMS transport, please have a family member take you directly to the ED at this time. Do not delay. Based on concerns about condition, if you do not follow up in th e ED, you may risk poor outcomes including worsening of condition, delayed treatment and potentially life threatening issues. If you have declined to go to the ED at this time, you should call your PCP immediately to set up a follow up appointment.  Go to ED for red flag symptoms, including; fevers you cannot reduce with Tylenol/Motrin, severe headaches, vision changes, numbness/weakness in part of the body, lethargy, confusion, intractable vomiting, severe dehydration, chest pain, breathing difficulty, severe persistent abdominal or pelvic pain, signs of severe infection (increased redness, swelling of an area), feeling faint or passing out, dizziness, etc. You should especially go to the ED for sudden acute worsening of condition if you do not elect to go at this time.     ED Prescriptions    None     PDMP not reviewed this encounter.   Danton Clap, PA-C 07/15/20 1333

## 2020-07-15 NOTE — Discharge Instructions (Addendum)

## 2020-07-16 ENCOUNTER — Ambulatory Visit
Admission: EM | Admit: 2020-07-16 | Discharge: 2020-07-16 | Disposition: A | Discharge status: Home / Self Care | Payer: PPO | Attending: Physician Assistant | Admitting: Physician Assistant

## 2020-07-16 ENCOUNTER — Ambulatory Visit (INDEPENDENT_AMBULATORY_CARE_PROVIDER_SITE_OTHER): Payer: PPO

## 2020-07-16 ENCOUNTER — Other Ambulatory Visit: Payer: Self-pay

## 2020-07-16 DIAGNOSIS — W19XXXA Unspecified fall, initial encounter: Secondary | ICD-10-CM

## 2020-07-16 DIAGNOSIS — R519 Headache, unspecified: Secondary | ICD-10-CM | POA: Diagnosis not present

## 2020-07-16 DIAGNOSIS — S060X0A Concussion without loss of consciousness, initial encounter: Secondary | ICD-10-CM

## 2020-07-16 DIAGNOSIS — R110 Nausea: Secondary | ICD-10-CM | POA: Diagnosis not present

## 2020-07-16 DIAGNOSIS — S0990XA Unspecified injury of head, initial encounter: Secondary | ICD-10-CM

## 2020-07-16 MED ORDER — TRAMADOL HCL 50 MG PO TABS: 50.0000 mg | ORAL_TABLET | Freq: Three times a day (TID) | ORAL | 0 refills | Status: AC | PRN

## 2020-07-16 MED ORDER — ONDANSETRON HCL 4 MG PO TABS: 4.0000 mg | ORAL_TABLET | Freq: Three times a day (TID) | ORAL | 0 refills | Status: AC | PRN

## 2020-07-16 NOTE — Discharge Instructions (Signed)
CT scan is normal.  You likely have a mild head injury and concussion.  I discussed care of concussion with you and please see information in the attached instructions.  Rest.  You can take Tylenol for headache.  If that does not work then you can take the tramadol.  Can take Zofran for nausea.  Try to avoid electronic use.  Call your PCP and let them know that you were seen here and ask about possible referral to physical therapy for concussion.  Go to ED for any severe acute worsening headache or more falls, dizziness

## 2020-07-16 NOTE — ED Notes (Addendum)
No prior auth required for CT scan

## 2020-07-16 NOTE — ED Provider Notes (Signed)
[facility]    CSN: [id number] Arrival date & time: 07/16/20  1015      History   Chief Complaint Chief Complaint  Patient presents with  . Headache    HPI Dana Obrien is a 75 y.o. female returning for headaches following fall a week ago. Patient presents with daughter today.  I saw this patient yesterday in the clinic and based on her symptoms of continued moderate headaches for 1 week with associated nausea, fatigue and occasional dizziness, advised her that she needed a CT scan.  We did not have the ability to perform a CT at the urgent care yesterday, so I advised her to go to the ED.  She declined EMS transport concerned that her daughter would take her to the ED.  Patient apparently went to the ED and waited for 15 minutes and thought that the wait was too long so she decided to leave.  She presents today requesting a CT scan of her head.  No changes in her symptoms from yesterday, but she does report difficulty concentrating and states that she forgot to mention that yesterday.   07/15/20: Dana Obrien is a 75 y.o. female presenting for headaches (about 7/10 in severity) for 1 week. She says 1 week ago she fell while standing on a chair and her head hit ceramic tile. Admits to no improvement in headaches and has associated nausea and fatigue.  She denies any loss of consciousness.  She says that she feels headaches constantly and they feel like a tightness of her entire head.  She says that after she fell she noticed a swollen lump of the right part of her head.  She says the swelling is gone down a little though still hurts in this area to touch.  Patient states that whenever she is up and moving around she will get dizzy and feel very nauseous.  She denies any vomiting.  Denies any vision changes, numbness, tingling or weakness.  No balance problems or confusion.  She does admit to difficulty sleeping at night, but this is nothing new.  Patient has taken  over-the-counter medication for her headache without relief.  She has no history of headaches.  She is not taking any anticoagulant medication.  Patient concern for persistent headaches and not gotten any better over a week's time.  She has no other concerns.  HPI  Past Medical History:  Diagnosis Date  . Anxiety   . Arthritis   . Asthma   . Breast cancer ([place]) 2004   right breast  . Breast cancer ([place]) 09/08/2017   left breast  . Breast cancer of upper-outer quadrant of left female breast ([place]) 09/17/2017   T1c, N0, ER 90%, PR 90%, 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  . Cancer [facility]) 2004   right lumpectomy, chemotherapy and radiation Dr. Jeb Levering  . CPAP (continuous positive airway pressure) dependence   . Depression   . GERD (gastroesophageal reflux disease)   . Hypertension   . Migraine   . Personal history of chemotherapy 2004  . Personal history of radiation therapy 2004  . Personal history of radiation therapy 2019  . PONV (postoperative nausea and vomiting)   . Shingles   . Sleep apnea    uses CPAP, severe OSA  . Vitamin D deficiency    IN THE PAST    Patient Active Problem List   Diagnosis Date Noted  . CPAP use counseling 03/25/2020  . Gastroesophageal reflux disease without esophagitis 03/25/2020  .  OSA on CPAP 03/25/2020  . Dermatitis due to plants, including poison ivy, sumac, and oak 02/29/2020  . Neuropathy 12/15/2019  . Allergic rhinitis 07/05/2019  . Brain atrophy ([place]) 05/18/2019  . Bilateral hearing loss 08/15/2018  . Decreased energy 08/15/2018  . Chest tightness 06/02/2018  . Prediabetes 06/02/2018  . Arthralgia 06/02/2018  . Congestion of both ears 06/02/2018  . BPPV (benign paroxysmal positional vertigo), right 03/11/2018  . Dysfunction of right eustachian tube 03/11/2018  . Malignant neoplasm of upper-outer quadrant of left breast in female, estrogen receptor positive ([place]) 09/14/2017  . Radiculopathy 08/24/2017  . Fatty liver 08/24/2017  . Abdominal  pain 08/24/2017  . Hearing difficulty of both ears 06/25/2017  . Chronic sinusitis 11/05/2016  . Bilateral sensorineural hearing loss 10/13/2016  . Right ear impacted cerumen 10/13/2016  . GERD (gastroesophageal reflux disease) 09/07/2016  . Cough 07/08/2016  . Exertional shortness of breath 01/15/2016  . Uterine prolapse 11/14/2015  . OSA (obstructive sleep apnea) 11/01/2015  . Back muscle spasm 04/30/2015  . DDD (degenerative disc disease), lumbar 04/30/2015  . Lumbar radiculitis 04/30/2015  . Low back pain 01/01/2015  . Varicose vein of leg 11/22/2014  . Post herpetic neuralgia 03/06/2014  . Increased frequency of urination 11/17/2013  . Rectocele 11/17/2013  . Cystocele 10/25/2013  . Skin lesion 10/25/2013  . Disorder of skin and subcutaneous tissue 10/25/2013  . Midline cystocele 10/25/2013  . Pelvic and perineal pain 10/25/2013  . Obesity, unspecified 10/19/2012  . History of breast cancer 02/11/2012  . Hyperlipidemia 05/11/2011  . Osteopenia 05/11/2011  . Disorder of bone and cartilage 05/11/2011  . Anxiety and depression 03/04/2011  . Hypertension 03/04/2011    Past Surgical History:  Procedure Laterality Date  . ABDOMINAL HYSTERECTOMY    . ANTERIOR AND POSTERIOR REPAIR    . BREAST BIOPSY Right 2004   stereo. infiltrating ductal carcinoma  . BREAST BIOPSY Left 09/07/2017   US guided biopsy/positive- invasaive mammary carcinoma  . 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  3.5 cm maximum diameter, minimal margins 5 mm.  In situ component less than 10%.  Marland Kitchen BREAST LUMPECTOMY Right 2004  . BREAST LUMPECTOMY Left 09/17/2017   Procedure: BREAST EXCISION, SENTINEL NODE BIOPSY;  Surgeon: Robert Bellow, MD;  Location: [facility];  Service: General;  Laterality: Left;  . COLONOSCOPY  2015  . COLONOSCOPY WITH PROPOFOL N/A 10/04/2019   Procedure: COLONOSCOPY WITH PROPOFOL;  Surgeon: Robert Bellow, MD;   Location: [facility];  Service: Endoscopy;  Laterality: N/A;  . DILATION AND CURETTAGE OF UTERUS    . ETHMOIDECTOMY Bilateral 11/05/2016   Procedure: ETHMOIDECTOMY;  Surgeon: Melida Quitter, MD;  Location: [place];  Service: ENT;  Laterality: Bilateral;  . LAPAROTOMY    . MAXILLARY ANTROSTOMY Bilateral 11/05/2016   Procedure: MAXILLARY ANTROSTOMY;  Surgeon: Melida Quitter, MD;  Location: [place];  Service: ENT;  Laterality: Bilateral;  . SINUS ENDO W/FUSION Bilateral 11/05/2016   Procedure: FRONTAL RECESS EXPLORATION;  Surgeon: Melida Quitter, MD;  Location: [place];  Service: ENT;  Laterality: Bilateral;  BILATERAL ENDOSCOPIC SINUS SURGERY WITH FUSION  . SINUS EXPLORATION  11/05/2016  . SPHENOIDECTOMY Bilateral 11/05/2016   Procedure: SPHENOIDECTOMY;  Surgeon: Melida Quitter, MD;  Location: [facility];  Service: ENT;  Laterality: Bilateral;    OB History    Gravida  2   Para      Term      Preterm  AB      Living        SAB      IAB      Ectopic      Multiple      Live Births  2        Obstetric Comments  1st Menstrual Cycle:  15 1st Pregnancy: 31           Home Medications    Prior to Admission medications   Medication Sig Start Date End Date Taking? Authorizing Provider  ondansetron (ZOFRAN) 4 MG tablet Take 1 tablet (4 mg total) by mouth every 8 (eight) hours as needed for up to 5 days for nausea or vomiting. 07/16/20 07/21/20 Yes Danton Clap, PA-C  traMADol (ULTRAM) 50 MG tablet Take 1 tablet (50 mg total) by mouth 3 (three) times daily as needed for up to 5 days. 07/16/20 07/21/20 Yes Danton Clap, PA-C  albuterol (VENTOLIN HFA) 108 (90 Base) MCG/ACT inhaler Inhale 2 puffs into the lungs every 6 (six) hours as needed for wheezing or shortness of breath. 06/19/20   Leone Haven, MD  aspirin 325 MG tablet Take 325 mg by mouth every 6 (six) hours as needed.    [provider]  celecoxib (CELEBREX) 100 MG capsule Take 1 capsule (100 mg total) by mouth 2  (two) times daily as needed for moderate pain. 03/28/20   Leone Haven, MD  diclofenac Sodium (VOLTAREN) 1 % GEL Apply 2 g topically 4 (four) times daily as needed.    [provider]  EQ ALLERGY RELIEF, CETIRIZINE, 10 MG tablet Take 1 tablet by mouth once daily 03/21/20   Leone Haven, MD  exemestane (AROMASIN) 25 MG tablet Take 1 tablet (25 mg total) by mouth daily after breakfast. 05/30/20   Sindy Guadeloupe, MD  FLUoxetine (PROZAC) 20 MG tablet Take 2 tablets (40 mg total) by mouth daily. 03/26/20   Leone Haven, MD  fluticasone (FLONASE) 50 MCG/ACT nasal spray Place 1 spray into both nostrils daily. 08/16/18   Guse, Jacquelynn Cree, FNP  hydrochlorothiazide (HYDRODIURIL) 25 MG tablet Take 1 tablet (25 mg total) by mouth daily. 05/20/20   Leone Haven, MD  hydrOXYzine (ATARAX/VISTARIL) 10 MG tablet Take 1 tablet (10 mg total) by mouth 3 (three) times daily as needed for itching. 08/16/18   Jodelle Green, FNP  Multiple Vitamin (MULTI-VITAMIN) tablet Take 1 tablet by mouth daily.     [provider]  Omega-3 Fatty Acids (FISH OIL PO) Take by mouth daily.    [provider]  ondansetron (ZOFRAN ODT) 4 MG disintegrating tablet Allow 1-2 tablets to dissolve in your mouth every 8 hours as needed for nausea/vomiting 04/10/19   Hinda Kehr, MD  pantoprazole (PROTONIX) 40 MG tablet Take 1 tablet (40 mg total) by mouth daily. 10/06/19   Leone Haven, MD  rosuvastatin (CRESTOR) 40 MG tablet Take 1 tablet (40 mg total) by mouth daily. 01/10/20   Leone Haven, MD  senna (SENOKOT) 8.6 MG TABS tablet Take 1 tablet by mouth at bedtime.     [provider]  SUMAtriptan (IMITREX) 20 MG/ACT nasal spray Place 1 spray (20 mg total) into the nose as needed for migraine or headache. May repeat in 2 hours if headache persists or recurs. 06/20/20 06/20/21  Leone Haven, MD  traZODone (DESYREL) 50 MG tablet Take 0.5-1 tablets (25-50 mg total) by mouth at  bedtime as needed for sleep. 12/15/19  Leone Haven, MD  tretinoin (RETIN-A) 0.05 % cream Apply 1 application topically at bedtime. 05/31/18   Leone Haven, MD  trolamine salicylate (ASPERCREME) 10 % cream Apply 1 application topically as needed for muscle pain.    [provider]    Family History Family History  Problem Relation Age of Onset  . Heart disease Mother   . Hyperlipidemia Mother   . Hypertension Mother   . Hypertension Father   . Osteoarthritis Father   . Stroke Father   . Alzheimer's disease Sister   . Alzheimer's disease Brother   . Brain cancer Brother   . Healthy Daughter   . Healthy Son   . Breast cancer Neg Hx     Social History Social History   Tobacco Use  . Smoking status: Never Smoker  . Smokeless tobacco: Never Used  Vaping Use  . Vaping Use: Never used  Substance Use Topics  . Alcohol use: Yes    Comment: rarely  . Drug use: No     Allergies   Lidocaine, Codeine, Morphine, Niacin, and Niacin and related   Review of Systems Review of Systems  Constitutional: Positive for fatigue. Negative for fever.  Eyes: Negative for photophobia and visual disturbance.  Gastrointestinal: Positive for nausea. Negative for vomiting.  Musculoskeletal: Positive for neck pain. Negative for neck stiffness.  Skin: Negative for wound.  Neurological: Positive for dizziness and headaches. Negative for syncope, weakness and light-headedness.  Psychiatric/Behavioral: Positive for decreased concentration and sleep disturbance. Negative for confusion.     Physical Exam Triage Vital Signs ED Triage Vitals  Enc Vitals Group     BP 07/16/20 1106 (!) 156/86     Pulse Rate 07/16/20 1106 74     Resp 07/16/20 1106 17     Temp 07/16/20 1106 98.2 F (36.8 C)     Temp Source 07/16/20 1106 Oral     SpO2 07/16/20 1106 95 %     Weight 07/16/20 1105 181 lb 7 oz (82.3 kg)     Height 07/16/20 1105 5' 4" (1.626 m)     Head Circumference --      Peak  Flow --      Pain Score 07/16/20 1105 7     Pain Loc --      Pain Edu? --      Excl. in [place]? --    No data found.  Updated Vital Signs BP (!) 156/86 (BP Location: Left Arm)   Pulse 74   Temp 98.2 F (36.8 C) (Oral)   Resp 17   Ht 5' 4" (1.626 m)   Wt 181 lb 7 oz (82.3 kg)   SpO2 95%   BMI 31.14 kg/m       Physical Exam Vitals and nursing note reviewed.  Constitutional:      General: She is not in acute distress.    Appearance: Normal appearance. She is well-developed. She is not ill-appearing or toxic-appearing.  HENT:     Head:     Comments: Small hematoma right parietal region    Right Ear: Tympanic membrane, ear canal and external ear normal.     Left Ear: Tympanic membrane, ear canal and external ear normal.     Nose: Nose normal.     Mouth/Throat:     Mouth: Mucous membranes are moist.     Pharynx: Oropharynx is clear.  Eyes:     General: No scleral icterus.       Right eye: No discharge.  Left eye: No discharge.     Extraocular Movements: Extraocular movements intact.     Conjunctiva/sclera: Conjunctivae normal.     Pupils: Pupils are equal, round, and reactive to light.  Cardiovascular:     Rate and Rhythm: Normal rate and regular rhythm.     Heart sounds: Normal heart sounds.  Pulmonary:     Effort: Pulmonary effort is normal. No respiratory distress.     Breath sounds: Normal breath sounds.  Musculoskeletal:     Cervical back: Neck supple. Tenderness (mild diffuse TTP cervical muscles, no spinal tenderness) present.  Skin:    General: Skin is dry.  Neurological:     General: No focal deficit present.     Mental Status: She is alert and oriented to person, place, and time. Mental status is at baseline.     Cranial Nerves: No cranial nerve deficit.     Sensory: Sensation is intact. No sensory deficit     Motor: No weakness.     Coordination: Coordination normal. Finger-Nose-Finger Test normal.     Gait: Gait normal.  Psychiatric:        Mood  and Affect: Mood normal.        Behavior: Behavior normal.        Thought Content: Thought content normal.    UC Treatments / Results  Labs (all labs ordered are listed, but only abnormal results are displayed) Labs Reviewed - No data to display  EKG   Radiology CT Head Wo Contrast  Result Date: 07/16/2020 CLINICAL DATA:  Head trauma, moderate to severe headache, fall EXAM: CT HEAD WITHOUT CONTRAST TECHNIQUE: Contiguous axial images were obtained from the base of the skull through the vertex without intravenous contrast. COMPARISON:  04/10/2019 FINDINGS: Brain: There is atrophy and chronic small vessel disease changes. No acute intracranial abnormality. Specifically, no hemorrhage, hydrocephalus, mass lesion, acute infarction, or significant intracranial injury. Vascular: No hyperdense vessel or unexpected calcification. Skull: No acute calvarial abnormality. Sinuses/Orbits: Visualized paranasal sinuses and mastoids clear. Orbital soft tissues unremarkable. Other: None IMPRESSION: Atrophy, chronic microvascular disease. No acute intracranial abnormality. Electronically Signed   By: Rolm Baptise M.D.   On: 07/16/2020 12:15    Procedures Procedures (including critical care time)  Medications Ordered in UC Medications - No data to display  Initial Impression / Assessment and Plan / UC Course  I have reviewed the triage vital signs and the nursing notes.  Pertinent labs & imaging results that were available during my care of the patient were reviewed by me and considered in my medical decision making (see chart for details).   75 year old female presenting with daughter for headaches x1 week after traumatic fall.  Has associated symptoms of headaches, dizziness, nausea, fatigue, sleep difficulty and problems concentrating.  Patient seen yesterday and advised go to ED, but she did not stay at the ED for CT scan.  Patient returning today requesting CT scan.  No change in symptoms from  yesterday.  The clinical vital signs are normal and stable.  No changes to patient's exam.  Chest wall hematoma of the right parietal region.  She also has some mild neck tenderness, but full range of motion.  Neurological exam is within normal limits.  CT head without contrast ordered.  CT overread is significant for atrophy and chronic microvascular disease, but no intracranial abnormality that is acute.  I discussed findings with patient and daughter.  Advised her that she likely has a mild to moderate concussion and discussed concussion care  with patient.  Patient states that she is out of tramadol but it was helping her headaches when she could take it and requests a refill.  I refilled tramadol for a couple of days.  Reviewed controlled substance database and patient is low risk for abuse.  Advised her to try Tylenol first and if that does not work then she can take the tramadol.  Zofran sent for nausea.  Advised relative rest.  Advised her to get a follow-up appointment with her PCP and if she still having concussion symptoms to request referral to physical therapy for concussion management.  Return and ED precautions reviewed patient and her daughter.  Patient safe to discharge at this time.   Final Clinical Impressions(s) / UC Diagnoses   Final diagnoses:  Traumatic injury of head, initial encounter  Concussion without loss of consciousness, initial encounter  Acute nonintractable headache, unspecified headache type  Nausea without vomiting     Discharge Instructions     CT scan is normal.  You likely have a mild head injury and concussion.  I discussed care of concussion with you and please see information in the attached instructions.  Rest.  You can take Tylenol for headache.  If that does not work then you can take the tramadol.  Can take Zofran for nausea.  Try to avoid electronic use.  Call your PCP and let them know that you were seen here and ask about possible referral to physical  therapy for concussion.  Go to ED for any severe acute worsening headache or more falls, dizziness    ED Prescriptions    Medication Sig Dispense Auth. Provider   traMADol (ULTRAM) 50 MG tablet Take 1 tablet (50 mg total) by mouth 3 (three) times daily as needed for up to 5 days. 8 tablet Laurene Footman B, PA-C   ondansetron (ZOFRAN) 4 MG tablet Take 1 tablet (4 mg total) by mouth every 8 (eight) hours as needed for up to 5 days for nausea or vomiting. 15 tablet Danton Clap, PA-C     I have reviewed the PDMP during this encounter.   Danton Clap, PA-C 07/16/20 1438

## 2020-07-16 NOTE — ED Triage Notes (Signed)
Pt seen here twice before for same for a fall. Went to ED yesterday for CT scan due to headache and nausea but the wait was too long.

## 2020-07-17 ENCOUNTER — Ambulatory Visit: Payer: PPO | Admitting: Internal Medicine

## 2020-07-23 ENCOUNTER — Encounter: Payer: Self-pay | Admitting: Oncology

## 2020-07-23 ENCOUNTER — Other Ambulatory Visit: Payer: Self-pay

## 2020-07-23 ENCOUNTER — Other Ambulatory Visit: Payer: Self-pay | Admitting: Family Medicine

## 2020-07-23 ENCOUNTER — Other Ambulatory Visit: Payer: Self-pay | Admitting: Internal Medicine

## 2020-07-23 ENCOUNTER — Encounter: Payer: Self-pay | Admitting: Family Medicine

## 2020-07-23 ENCOUNTER — Inpatient Hospital Stay: Payer: PPO | Attending: Oncology | Admitting: Oncology

## 2020-07-23 VITALS — BP 142/89 | HR 86 | Temp 98.1°F | Wt 182.0 lb

## 2020-07-23 DIAGNOSIS — C50912 Malignant neoplasm of unspecified site of left female breast: Secondary | ICD-10-CM | POA: Insufficient documentation

## 2020-07-23 DIAGNOSIS — M549 Dorsalgia, unspecified: Secondary | ICD-10-CM | POA: Insufficient documentation

## 2020-07-23 DIAGNOSIS — Z853 Personal history of malignant neoplasm of breast: Secondary | ICD-10-CM | POA: Diagnosis not present

## 2020-07-23 DIAGNOSIS — Z808 Family history of malignant neoplasm of other organs or systems: Secondary | ICD-10-CM | POA: Diagnosis not present

## 2020-07-23 DIAGNOSIS — Z9221 Personal history of antineoplastic chemotherapy: Secondary | ICD-10-CM | POA: Diagnosis not present

## 2020-07-23 DIAGNOSIS — Z8249 Family history of ischemic heart disease and other diseases of the circulatory system: Secondary | ICD-10-CM | POA: Diagnosis not present

## 2020-07-23 DIAGNOSIS — I10 Essential (primary) hypertension: Secondary | ICD-10-CM | POA: Insufficient documentation

## 2020-07-23 DIAGNOSIS — Z79811 Long term (current) use of aromatase inhibitors: Secondary | ICD-10-CM | POA: Insufficient documentation

## 2020-07-23 DIAGNOSIS — Z79899 Other long term (current) drug therapy: Secondary | ICD-10-CM | POA: Insufficient documentation

## 2020-07-23 DIAGNOSIS — Z170 Estrogen receptor positive status [ER+]: Secondary | ICD-10-CM | POA: Insufficient documentation

## 2020-07-23 DIAGNOSIS — Z885 Allergy status to narcotic agent status: Secondary | ICD-10-CM | POA: Insufficient documentation

## 2020-07-23 DIAGNOSIS — Z923 Personal history of irradiation: Secondary | ICD-10-CM | POA: Diagnosis not present

## 2020-07-23 DIAGNOSIS — S0990XA Unspecified injury of head, initial encounter: Secondary | ICD-10-CM | POA: Insufficient documentation

## 2020-07-23 DIAGNOSIS — Z823 Family history of stroke: Secondary | ICD-10-CM | POA: Insufficient documentation

## 2020-07-23 DIAGNOSIS — Z08 Encounter for follow-up examination after completed treatment for malignant neoplasm: Secondary | ICD-10-CM | POA: Diagnosis not present

## 2020-07-23 DIAGNOSIS — G8929 Other chronic pain: Secondary | ICD-10-CM | POA: Insufficient documentation

## 2020-07-23 DIAGNOSIS — R202 Paresthesia of skin: Secondary | ICD-10-CM | POA: Insufficient documentation

## 2020-07-23 DIAGNOSIS — R5383 Other fatigue: Secondary | ICD-10-CM | POA: Diagnosis not present

## 2020-07-23 DIAGNOSIS — Z8349 Family history of other endocrine, nutritional and metabolic diseases: Secondary | ICD-10-CM | POA: Diagnosis not present

## 2020-07-23 DIAGNOSIS — K219 Gastro-esophageal reflux disease without esophagitis: Secondary | ICD-10-CM | POA: Insufficient documentation

## 2020-07-23 DIAGNOSIS — M1990 Unspecified osteoarthritis, unspecified site: Secondary | ICD-10-CM | POA: Diagnosis not present

## 2020-07-23 NOTE — Telephone Encounter (Signed)
Can you get her scheduled for a follow-up on her BP? It also looks like she I waited for concussion about a week ago. She needs to follow-up for that as well.

## 2020-07-23 NOTE — Progress Notes (Signed)
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   Chief complaint/ Reason for visit-routine follow-up of breast cancer  Heme/Onc history: [facility]. T2N0M0.She thenn completed 5 years of aromasin until 2009.She could not tolerate arimidex.  Sscreening mammogram on 08/25/2017 showed a possible mass in her left breast. This was followed by an ultrasound and a diagnostic left mammogram which showed a 0.7 x 0.5 x 0.7 cm mass in the left breast. No evidence of left axillary adenopathy.  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 ER greater than 90% positive, PR greater than 90% positive and HER-2/neu negative   Patient has been seen by Dr. Camelia Phenes is scheduled to undergo surgery on 09/17/2017  She is G2P2L2. Menarche at the age of 31. Menopause in her early 57's. She used to take prempro from [age] yrs but none since then. She does have chronic back pain and tingling/pain in her extremities. Cervical spine MRI showed egenrative disease  Final pathology showed:Invasive mammary carcinoma 13 mm, grade 1 with associated DCIS. Negative margins. 0 out of 3 lymph nodes were positive for malignancy. 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  Reports that her joint pains got progressively worse when she started taking the medication to the point that she was needing to use a cane and was not able to walk well.  Also reports increased fatigue when she takes the medication.  ECOG PS- 2 Pain scale- 2 Opioid associated constipation- no  Review of systems- Review of Systems  Constitutional: Positive for malaise/fatigue. Negative for chills, fever and weight loss.  HENT: Negative for congestion, ear discharge and nosebleeds.   Eyes: Negative for blurred vision.  Respiratory: Negative for cough, hemoptysis, sputum production, shortness of breath and wheezing.   Cardiovascular: Negative for chest pain, palpitations, orthopnea and claudication.  Gastrointestinal: Negative for abdominal pain, blood in stool, constipation, diarrhea, heartburn, melena, nausea and vomiting.  Genitourinary: Negative for dysuria, flank pain, frequency, hematuria and urgency.  Musculoskeletal: Positive for joint pain. Negative for back pain and myalgias.  Skin: Negative for rash.  Neurological: Negative for dizziness, tingling, focal weakness, seizures, weakness and headaches.  Endo/Heme/Allergies: Does not bruise/bleed easily.  Psychiatric/Behavioral: Negative for depression and suicidal ideas. The patient does not have insomnia.        Allergies  Allergen Reactions  . Lidocaine Palpitations  . Codeine Nausea Only and Nausea And Vomiting  . Morphine Nausea Only  . Niacin Rash  . Niacin And Related Rash    ANTIHYPERLIPEMICS     Past  Medical History:  Diagnosis Date  . Anxiety   . Arthritis   .  Asthma   . Breast cancer ([place]) 2004   right breast  . Breast cancer ([place]) 09/08/2017   left breast  . Breast cancer of upper-outer quadrant of left female breast ([place]) 09/17/2017   T1c, N0, ER 90%, PR 90%, 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  . Cancer [facility]) 2004   right lumpectomy, chemotherapy and radiation Dr. Jeb Levering  . CPAP (continuous positive airway pressure) dependence   . Depression   . GERD (gastroesophageal reflux disease)   . Hypertension   . Migraine   . Personal history of chemotherapy 2004  . Personal history of radiation therapy 2004  . Personal history of radiation therapy 2019  . PONV (postoperative nausea and vomiting)   . Shingles   . Sleep apnea    uses CPAP, severe OSA  . Vitamin D deficiency    IN THE PAST     Past Surgical History:  Procedure Laterality Date  . ABDOMINAL HYSTERECTOMY    . ANTERIOR AND POSTERIOR REPAIR    . BREAST BIOPSY Right 2004   stereo. infiltrating ductal carcinoma  . BREAST BIOPSY Left 09/07/2017   US guided biopsy/positive- invasaive mammary carcinoma  . 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  3.5 cm maximum diameter, minimal margins 5 mm.  In situ component less than 10%.  Marland Kitchen BREAST LUMPECTOMY Right 2004  . BREAST LUMPECTOMY Left 09/17/2017   Procedure: BREAST EXCISION, SENTINEL NODE BIOPSY;  Surgeon: Robert Bellow, MD;  Location: [facility] ORS;  Service: General;  Laterality: Left;  . COLONOSCOPY  2015  . COLONOSCOPY WITH PROPOFOL N/A 10/04/2019   Procedure: COLONOSCOPY WITH PROPOFOL;  Surgeon: Robert Bellow, MD;  Location: [facility] ENDOSCOPY;  Service: Endoscopy;  Laterality: N/A;  . DILATION AND CURETTAGE OF UTERUS    . ETHMOIDECTOMY Bilateral 11/05/2016   Procedure: ETHMOIDECTOMY;  Surgeon: Melida Quitter, MD;  Location: [place];  Service: ENT;  Laterality: Bilateral;  . LAPAROTOMY    . MAXILLARY ANTROSTOMY Bilateral 11/05/2016   Procedure: MAXILLARY ANTROSTOMY;   Surgeon: Melida Quitter, MD;  Location: [place];  Service: ENT;  Laterality: Bilateral;  . SINUS ENDO W/FUSION Bilateral 11/05/2016   Procedure: FRONTAL RECESS EXPLORATION;  Surgeon: Melida Quitter, MD;  Location: [place];  Service: ENT;  Laterality: Bilateral;  BILATERAL ENDOSCOPIC SINUS SURGERY WITH FUSION  . SINUS EXPLORATION  11/05/2016  . SPHENOIDECTOMY Bilateral 11/05/2016   Procedure: SPHENOIDECTOMY;  Surgeon: Melida Quitter, MD;  Location: [facility] OR;  Service: ENT;  Laterality: Bilateral;    Social History   Socioeconomic History  . Marital status: Widowed    Spouse name: Not on file  . Number of children: Not on file  . Years of education: Not on file  . Highest education level: Not on file  Occupational History  . Not on file  Tobacco Use  . Smoking status: Never Smoker  . Smokeless tobacco: Never Used  Vaping Use  . Vaping Use: Never used  Substance and Sexual Activity  . Alcohol use: Yes    Comment: rarely  . Drug use: No  . Sexual activity: Not Currently  Other Topics Concern  . Not on file  Social History Narrative   Daily Caffeine Use:  1-2 coffee   Regular Exercise -  NO   Social Determinants of Health   Financial Resource Strain: Not on file  Food Insecurity: Not on file  Transportation Needs:  No Transportation Needs  . Lack of Transportation (Medical): No  . Lack of Transportation (Non-Medical): No  Physical Activity: Unknown  . Days of Exercise per Week: 0 days  . Minutes of Exercise per Session: Not on file  Stress: Not on file  Social Connections: Unknown  . Frequency of Communication with Friends and Family: More than three times a week  . Frequency of Social Gatherings with Friends and Family: More than three times a week  . Attends Religious Services: Not on file  . Active Member of Clubs or Organizations: Not on file  . Attends Archivist Meetings: Not on file  . Marital Status: Widowed  Intimate Partner Violence: Not At Risk  . Fear of Current  or Ex-Partner: No  . Emotionally Abused: No  . Physically Abused: No  . Sexually Abused: No    Family History  Problem Relation Age of Onset  . Heart disease Mother   . Hyperlipidemia Mother   . Hypertension Mother   . Hypertension Father   . Osteoarthritis Father   . Stroke Father   . Alzheimer's disease Sister   . Alzheimer's disease Brother   . Brain cancer Brother   . Healthy Daughter   . Healthy Son   . Breast cancer Neg Hx      Current Outpatient Medications:  .  albuterol (VENTOLIN HFA) 108 (90 Base) MCG/ACT inhaler, Inhale 2 puffs into the lungs every 6 (six) hours as needed for wheezing or shortness of breath., Disp: 8 g, Rfl: 0 .  celecoxib (CELEBREX) 100 MG capsule, Take 1 capsule (100 mg total) by mouth 2 (two) times daily as needed for moderate pain., Disp: 60 capsule, Rfl: 1 .  diclofenac Sodium (VOLTAREN) 1 % GEL, Apply 2 g topically 4 (four) times daily as needed., Disp: , Rfl:  .  EQ ALLERGY RELIEF, CETIRIZINE, 10 MG tablet, Take 1 tablet by mouth once daily, Disp: 90 tablet, Rfl: 0 .  FLUoxetine (PROZAC) 20 MG tablet, Take 2 tablets (40 mg total) by mouth daily., Disp: 60 tablet, Rfl: 3 .  fluticasone (FLONASE) 50 MCG/ACT nasal spray, Place 1 spray into both nostrils daily., Disp: 16 g, Rfl: 5 .  hydrochlorothiazide (HYDRODIURIL) 25 MG tablet, Take 1 tablet (25 mg total) by mouth daily., Disp: 90 tablet, Rfl: 0 .  hydrOXYzine (ATARAX/VISTARIL) 10 MG tablet, Take 1 tablet (10 mg total) by mouth 3 (three) times daily as needed for itching., Disp: 90 tablet, Rfl: 1 .  Multiple Vitamin (MULTI-VITAMIN) tablet, Take 1 tablet by mouth daily. , Disp: , Rfl:  .  Omega-3 Fatty Acids (FISH OIL PO), Take by mouth daily., Disp: , Rfl:  .  ondansetron (ZOFRAN ODT) 4 MG disintegrating tablet, Allow 1-2 tablets to dissolve in your mouth every 8 hours as needed for nausea/vomiting, Disp: 30 tablet, Rfl: 0 .  pantoprazole (PROTONIX) 40 MG tablet, Take 1 tablet (40 mg total) by  mouth daily., Disp: 90 tablet, Rfl: 2 .  rosuvastatin (CRESTOR) 40 MG tablet, Take 1 tablet (40 mg total) by mouth daily., Disp: 90 tablet, Rfl: 1 .  senna (SENOKOT) 8.6 MG TABS tablet, Take 1 tablet by mouth at bedtime. , Disp: , Rfl:  .  SUMAtriptan (IMITREX) 20 MG/ACT nasal spray, Place 1 spray (20 mg total) into the nose as needed for migraine or headache. May repeat in 2 hours if headache persists or recurs., Disp: 1 each, Rfl: 2 .  traMADol (ULTRAM) 50 MG tablet, Take 50 mg by mouth  every 6 (six) hours as needed., Disp: , Rfl:  .  traZODone (DESYREL) 50 MG tablet, Take 0.5-1 tablets (25-50 mg total) by mouth at bedtime as needed for sleep., Disp: 30 tablet, Rfl: 3 .  tretinoin (RETIN-A) 0.05 % cream, Apply 1 application topically at bedtime., Disp: 45 g, Rfl: 0 .  trolamine salicylate (ASPERCREME) 10 % cream, Apply 1 application topically as needed for muscle pain., Disp: , Rfl:  .  exemestane (AROMASIN) 25 MG tablet, Take 1 tablet (25 mg total) by mouth daily after breakfast. (Patient not taking: Reported on 07/23/2020), Disp: 30 tablet, Rfl: 2  Physical exam:  Vitals:   07/23/20 1152  BP: (!) 142/89  Pulse: 86  Temp: 98.1 F (36.7 C)  TempSrc: Tympanic  SpO2: 100%  Weight: 182 lb (82.6 kg)   Physical Exam Constitutional:      General: She is not in acute distress. Eyes:     Extraocular Movements: EOM normal.  Cardiovascular:     Rate and Rhythm: Normal rate and regular rhythm.     Heart sounds: Normal heart sounds.  Pulmonary:     Effort: Pulmonary effort is normal.     Breath sounds: Normal breath sounds.  Abdominal:     General: Bowel sounds are normal.     Palpations: Abdomen is soft.  Skin:    General: Skin is warm and dry.  Neurological:     Mental Status: She is alert and oriented to person, place, and time.   Breast exam: Patient is s/p bilateral lumpectomy with a well-healed surgical scar.  No palpable masses in either breast.  No palpable bilateral axillary  adenopathy.  Right nipple is chronically inverted.  CMP Latest Ref Rng & Units 02/23/2020  Glucose 70 - 99 mg/dL 98  BUN 6 - 23 mg/dL 19  Creatinine 0.40 - 1.20 mg/dL 0.74  Sodium 135 - 145 mEq/L 142  Potassium 3.5 - 5.1 mEq/L 3.4(L)  Chloride 96 - 112 mEq/L 100  CO2 19 - 32 mEq/L 32  Calcium 8.4 - 10.5 mg/dL 10.1  Total Protein 6.0 - 8.3 g/dL 7.1  Total Bilirubin 0.2 - 1.2 mg/dL 0.5  Alkaline Phos 39 - 117 U/L 55  AST 0 - 37 U/L 19  ALT 0 - 35 U/L 18   CBC Latest Ref Rng & Units 02/23/2020  WBC 4.0 - 10.5 K/uL 7.3  Hemoglobin 12.0 - 15.0 g/dL 13.3  Hematocrit 36.0 - 46.0 % 39.2  Platelets 150.0 - 400.0 K/uL 188.0    No images are attached to the encounter.  CT Head Wo Contrast  Result Date: 07/16/2020 CLINICAL DATA:  Head trauma, moderate to severe headache, fall EXAM: CT HEAD WITHOUT CONTRAST TECHNIQUE: Contiguous axial images were obtained from the base of the skull through the vertex without intravenous contrast. COMPARISON:  04/10/2019 FINDINGS: Brain: There is atrophy and chronic small vessel disease changes. No acute intracranial abnormality. Specifically, no hemorrhage, hydrocephalus, mass lesion, acute infarction, or significant intracranial injury. Vascular: No hyperdense vessel or unexpected calcification. Skull: No acute calvarial abnormality. Sinuses/Orbits: Visualized paranasal sinuses and mastoids clear. Orbital soft tissues unremarkable. Other: None IMPRESSION: Atrophy, chronic microvascular disease. No acute intracranial abnormality. Electronically Signed   By: Rolm Baptise M.D.   On: 07/16/2020 12:15     Assessment and plan- Patient is a 75 y.o. female with stage I left breast cancer ER positive s/p lumpectomy and adjuvant radiation treatment.  She is here for routine follow-up.  Patient has not been able to tolerate  aromatase inhibitors given her significant arthralgias and impacting her quality of life.  We discussed focusing on her present quality of life versus  reducing the risk of future breast cancer.  Given her age and the impact on her quality of life we have decided to stop hormone therapy at this time altogether.  She will continue getting yearly mammograms and I will schedule one for July 2022.  Clinically patient is otherwise doing well with no signs concerning signs and symptoms of recurrence based on today's exam.  I will see her back in 6 months Visit Diagnosis 1. Encounter for follow-up surveillance of breast cancer      Dr. Randa Evens, MD, MPH [facility] [phone] 07/23/2020 3:02 PM

## 2020-07-24 ENCOUNTER — Ambulatory Visit: Payer: PPO | Admitting: Family Medicine

## 2020-07-25 ENCOUNTER — Other Ambulatory Visit: Payer: Self-pay | Admitting: Internal Medicine

## 2020-07-25 ENCOUNTER — Telehealth: Payer: Self-pay

## 2020-07-25 NOTE — Telephone Encounter (Signed)
I called and LVM for the patient to call back for a follow up on her concussion and her BP.  Latanza Pfefferkorn,cma

## 2020-07-26 ENCOUNTER — Telehealth: Payer: Self-pay | Admitting: Family Medicine

## 2020-07-26 MED ORDER — TRAMADOL HCL 50 MG PO TABS: 50.0000 mg | ORAL_TABLET | Freq: Four times a day (QID) | ORAL | 0 refills | Status: DC | PRN

## 2020-07-26 MED ORDER — CELECOXIB 100 MG PO CAPS: 100.0000 mg | ORAL_CAPSULE | Freq: Two times a day (BID) | ORAL | 1 refills | Status: DC | PRN

## 2020-07-26 MED ORDER — ROSUVASTATIN CALCIUM 40 MG PO TABS: 40.0000 mg | ORAL_TABLET | Freq: Every day | ORAL | 1 refills | Status: DC

## 2020-07-26 NOTE — Telephone Encounter (Signed)
Tramadol sent to pharmacy

## 2020-07-26 NOTE — Telephone Encounter (Signed)
Patient is requesting a refill on the following medications: traMADol (ULTRAM) 50 MG tablet and rosuvastatin (CRESTOR) 40 MG tablet and celecoxib (CELEBREX) 100 MG capsule. Patient has an upcoming appointment on 08/07/2020.

## 2020-07-29 ENCOUNTER — Other Ambulatory Visit: Payer: Self-pay | Admitting: Family Medicine

## 2020-08-05 ENCOUNTER — Telehealth: Payer: Self-pay | Admitting: Family Medicine

## 2020-08-05 NOTE — Telephone Encounter (Signed)
Pt would like a refill on Xanax her grandson is in the hospital and she needs it to help with her nerves.  Dana Obrien,cma

## 2020-08-05 NOTE — Telephone Encounter (Signed)
I called the patient and explained to her that being that she has not been seen in 2 years she would need to be seen before prescribing and she understood.  [name],cma

## 2020-08-05 NOTE — Telephone Encounter (Signed)
Pt would like a refill on Xanax her grandson is in the hospital and she needs it to help with her nerves

## 2020-08-05 NOTE — Telephone Encounter (Signed)
I would like to have her complete a visit for this.  She has not been on the Xanax in over 2 years and thus to consider prescribing this again we will need to complete a visit.

## 2020-08-07 ENCOUNTER — Encounter: Payer: Self-pay | Admitting: Family Medicine

## 2020-08-07 ENCOUNTER — Ambulatory Visit (INDEPENDENT_AMBULATORY_CARE_PROVIDER_SITE_OTHER): Payer: PPO | Admitting: Family Medicine

## 2020-08-07 ENCOUNTER — Other Ambulatory Visit: Payer: Self-pay

## 2020-08-07 VITALS — BP 130/80 | HR 74 | Temp 98.4°F | Ht 64.0 in | Wt 184.8 lb

## 2020-08-07 DIAGNOSIS — S060X0A Concussion without loss of consciousness, initial encounter: Secondary | ICD-10-CM | POA: Diagnosis not present

## 2020-08-07 DIAGNOSIS — F32A Depression, unspecified: Secondary | ICD-10-CM

## 2020-08-07 DIAGNOSIS — F419 Anxiety disorder, unspecified: Secondary | ICD-10-CM

## 2020-08-07 DIAGNOSIS — G4733 Obstructive sleep apnea (adult) (pediatric): Secondary | ICD-10-CM

## 2020-08-07 DIAGNOSIS — E559 Vitamin D deficiency, unspecified: Secondary | ICD-10-CM | POA: Diagnosis not present

## 2020-08-07 MED ORDER — ALPRAZOLAM 0.25 MG PO TABS: 0.2500 mg | ORAL_TABLET | Freq: Two times a day (BID) | ORAL | 0 refills | Status: DC | PRN

## 2020-08-07 NOTE — Progress Notes (Signed)
[name], MD Phone: [phone]  Dana Obrien is a 75 y.o. female who presents today for follow-up.  OSA: Patient notes she intermittently uses her CPAP.  It was recalled due to a issue with breakdown of one of the materials.  She notes the company gave her a filter to add to this as a method of continuing to use her CPAP though she has not continuously felt comfortable using this given that the filter oftentimes has black material on it.  She does note she feels better when she uses her CPAP.  Concussion: Patient notes she was on a kitchen chair when she fell and hit her head.  No loss of consciousness.  She was evaluated in urgent care and had a CT scan that did not reveal any acute changes.  She notes her concentration issues have improved.  No significant balance difficulty.  She continues to have headache on a daily basis following this.  No numbness or weakness.  Anxiety/depression: Patient has lots of anxiety.  She is trying to complete her will and do healthcare power of attorney's.  She is selling her house.  Her grandson has been in the hospital with pneumonia as well.  She notes the Prozac has been helpful.  She does note some mild depression.  No SI.  She has been sleeping better lately.  In the past she has been on Xanax to help with acute anxiety and notes she thinks that would be beneficial in the short-term.  Social History   Tobacco Use  Smoking Status Never Smoker  Smokeless Tobacco Never Used    Current Outpatient Medications on File Prior to Visit  Medication Sig Dispense Refill  . albuterol (VENTOLIN HFA) 108 (90 Base) MCG/ACT inhaler Inhale 2 puffs into the lungs every 6 (six) hours as needed for wheezing or shortness of breath. 8 g 0  . celecoxib (CELEBREX) 100 MG capsule Take 1 capsule (100 mg total) by mouth 2 (two) times daily as needed for moderate pain. 60 capsule 1  . cetirizine (ZYRTEC) 10 MG tablet Take 1 tablet by mouth once daily 90 tablet 0  .  diclofenac Sodium (VOLTAREN) 1 % GEL Apply 2 g topically 4 (four) times daily as needed.    Marland Kitchen FLUoxetine (PROZAC) 20 MG tablet Take 2 tablets (40 mg total) by mouth daily. 60 tablet 3  . fluticasone (FLONASE) 50 MCG/ACT nasal spray Place 1 spray into both nostrils daily. 16 g 5  . hydrochlorothiazide (HYDRODIURIL) 25 MG tablet Take 1 tablet (25 mg total) by mouth daily. 90 tablet 0  . hydrOXYzine (ATARAX/VISTARIL) 10 MG tablet Take 1 tablet (10 mg total) by mouth 3 (three) times daily as needed for itching. 90 tablet 1  . Multiple Vitamin (MULTI-VITAMIN) tablet Take 1 tablet by mouth daily.     . Omega-3 Fatty Acids (FISH OIL PO) Take by mouth daily.    . ondansetron (ZOFRAN ODT) 4 MG disintegrating tablet Allow 1-2 tablets to dissolve in your mouth every 8 hours as needed for nausea/vomiting 30 tablet 0  . pantoprazole (PROTONIX) 40 MG tablet Take 1 tablet (40 mg total) by mouth daily. 90 tablet 2  . rosuvastatin (CRESTOR) 40 MG tablet Take 1 tablet (40 mg total) by mouth daily. 90 tablet 1  . senna (SENOKOT) 8.6 MG TABS tablet Take 1 tablet by mouth at bedtime.     . SUMAtriptan (IMITREX) 20 MG/ACT nasal spray Place 1 spray (20 mg total) into the nose as needed for migraine  or headache. May repeat in 2 hours if headache persists or recurs. 1 each 2  . traMADol (ULTRAM) 50 MG tablet Take 1 tablet (50 mg total) by mouth every 6 (six) hours as needed. 30 tablet 0  . traZODone (DESYREL) 50 MG tablet TAKE 1/2 TO 1 (ONE-HALF TO ONE) TABLET BY MOUTH AT BEDTIME AS NEEDED FOR SLEEP 30 tablet 0  . tretinoin (RETIN-A) 0.05 % cream Apply 1 application topically at bedtime. 45 g 0  . trolamine salicylate (ASPERCREME) 10 % cream Apply 1 application topically as needed for muscle pain.     No current facility-administered medications on file prior to visit.     ROS see history of present illness  Objective  Physical Exam Vitals:   08/07/20 1429  BP: 130/80  Pulse: 74  Temp: 98.4 F (36.9 C)  SpO2:  96%    BP Readings from Last 3 Encounters:  08/07/20 130/80  07/23/20 (!) 142/89  07/16/20 (!) 156/86   Wt Readings from Last 3 Encounters:  08/07/20 184 lb 12.8 oz (83.8 kg)  07/23/20 182 lb (82.6 kg)  07/16/20 181 lb 7 oz (82.3 kg)    Physical Exam Constitutional:      General: She is not in acute distress.    Appearance: She is not diaphoretic.  Cardiovascular:     Rate and Rhythm: Normal rate and regular rhythm.     Heart sounds: Normal heart sounds.  Pulmonary:     Effort: Pulmonary effort is normal.     Breath sounds: Normal breath sounds.  Musculoskeletal:        General: No edema.  Skin:    General: Skin is warm and dry.  Neurological:     Mental Status: She is alert.     Comments: EOMI, PERRL, facial sensation intact V1 through V3, hearing intact to finger rub, eyes open and close normally, shoulder shrug intact, 5/5 strength in bilateral biceps, triceps, grip, quads, hamstrings, plantar and dorsiflexion, sensation to light touch intact in bilateral UE and LE, normal gait, negative Romberg, no pronator drift      Assessment/Plan: Please see individual problem list.  Problem List Items Addressed This Visit    Anxiety and depression    Patient with anxiety that was worse though now slightly better.  Mild depression.  Prozac seems to be beneficial.  A number of the things that are causing increased anxiety seem to be short-term issues.  She has tolerated Xanax in the past and thus we will give her a small supply to use only in the short-term.  She will monitor for drowsiness or other side effects with this.  She will continue Prozac 20 mg once daily.      Relevant Medications   ALPRAZolam (XANAX) 0.25 MG tablet   Concussion with no loss of consciousness - Primary    Patient with continued headaches post concussion.  Prior CT imaging without any acute issues.  Discussed mental and physical rest as a means for treatment of her postconcussive symptoms.  We will refer  her to sports medicine for further management of this.      Relevant Orders   Ambulatory referral to Sports Medicine   OSA (obstructive sleep apnea)    The patient has only intermittently been using her CPAP due to a breakdown of one of the materials and her concern regarding that.  She does appear to benefit from this based on reported symptoms when she does use her CPAP.  Its been about 5  years since her last sleep study.  We will order another sleep study and see if we can get a new CPAP for her.      Relevant Orders   Home sleep test   Vitamin D deficiency    Check vitamin D.      Relevant Orders   Vitamin D (25 hydroxy)       This visit occurred during the SARS-CoV-2 public health emergency.  Safety protocols were in place, including screening questions prior to the visit, additional usage of staff PPE, and extensive cleaning of exam room while observing appropriate contact time as indicated for disinfecting solutions.    [name], MD [place]

## 2020-08-07 NOTE — Patient Instructions (Signed)
Nice to see you. We will get you to see sports medicine for your concussion. You can try the Xanax on an as-needed basis if you really need it. Please continue your Prozac. We will check into the need for sleep study.

## 2020-08-08 ENCOUNTER — Telehealth: Payer: Self-pay | Admitting: Family Medicine

## 2020-08-08 DIAGNOSIS — S060X0A Concussion without loss of consciousness, initial encounter: Secondary | ICD-10-CM | POA: Insufficient documentation

## 2020-08-08 DIAGNOSIS — E559 Vitamin D deficiency, unspecified: Secondary | ICD-10-CM | POA: Insufficient documentation

## 2020-08-08 LAB — VITAMIN D 25 HYDROXY (VIT D DEFICIENCY, FRACTURES): VITD: 34.33 ng/mL (ref 30.00–100.00)

## 2020-08-08 NOTE — Telephone Encounter (Signed)
Referral received for patient regarding her concussion from PCP. Patient was seen in the ED on 07/15/2020 1 week after a fall.  Please advise on scheduling.

## 2020-08-08 NOTE — Assessment & Plan Note (Signed)
The patient has only intermittently been using her CPAP due to a breakdown of one of the materials and her concern regarding that.  She does appear to benefit from this based on reported symptoms when she does use her CPAP.  Its been about 5 years since her last sleep study.  We will order another sleep study and see if we can get a new CPAP for her.

## 2020-08-08 NOTE — Assessment & Plan Note (Signed)
Patient with continued headaches post concussion.  Prior CT imaging without any acute issues.  Discussed mental and physical rest as a means for treatment of her postconcussive symptoms.  We will refer her to sports medicine for further management of this.

## 2020-08-08 NOTE — Assessment & Plan Note (Signed)
Check vitamin D. 

## 2020-08-08 NOTE — Assessment & Plan Note (Signed)
Patient with anxiety that was worse though now slightly better.  Mild depression.  Prozac seems to be beneficial.  A number of the things that are causing increased anxiety seem to be short-term issues.  She has tolerated Xanax in the past and thus we will give her a small supply to use only in the short-term.  She will monitor for drowsiness or other side effects with this.  She will continue Prozac 20 mg once daily.

## 2020-08-09 NOTE — Telephone Encounter (Signed)
Called pt and LM for her to return call if she would like to be scheduled for an initial concussion evaluation.  Please schedule in any 30 min slot between 8am-3:30 pm.

## 2020-08-10 ENCOUNTER — Encounter: Payer: Self-pay | Admitting: *Deleted

## 2020-08-16 ENCOUNTER — Ambulatory Visit (INDEPENDENT_AMBULATORY_CARE_PROVIDER_SITE_OTHER): Payer: PPO

## 2020-08-16 ENCOUNTER — Encounter: Payer: Self-pay | Admitting: Family Medicine

## 2020-08-16 VITALS — Ht 64.0 in | Wt 184.0 lb

## 2020-08-16 DIAGNOSIS — Z0000 Encounter for general adult medical examination without abnormal findings: Secondary | ICD-10-CM

## 2020-08-16 MED ORDER — FLUOXETINE HCL 60 MG PO TABS: 60.0000 mg | ORAL_TABLET | Freq: Every day | ORAL | 1 refills | Status: DC

## 2020-08-16 NOTE — Patient Instructions (Addendum)
Dana Obrien , Thank you for taking time to come for your Medicare Wellness Visit. I appreciate your ongoing commitment to your health goals. Please review the following plan we discussed and let me know if I can assist you in the future.   These are the goals we discussed: Goals      Patient Stated   .  Increase physical activity (pt-stated)      Walk for exercise as tolerated.       This is a list of the screening recommended for you and due dates:  Health Maintenance  Topic Date Due  . COVID-19 Vaccine (3 - Pfizer risk 4-dose series) 11/08/2020*  . Tetanus Vaccine  08/16/2021*  . Mammogram  12/12/2021  . Colon Cancer Screening  10/03/2024  . Flu Shot  Completed  . DEXA scan (bone density measurement)  Completed  .  Hepatitis C: One time screening is recommended by Center for Disease Control  (CDC) for  adults born from [birth year] through 1963.   Completed  . HPV Vaccine  Aged Out  *Topic was postponed. The date shown is not the original due date.    Immunizations Immunization History  Administered Date(s) Administered  . Fluad Quad(high Dose 65+) 02/23/2020  . Influenza Whole 06/04/2008  . Influenza, High Dose Seasonal PF 08/11/2017, 05/30/2018  . Influenza,inj,Quad PF,6+ Mos 03/06/2014  . Influenza-Unspecified 03/21/2012, 03/01/2013, 04/30/2015  . PFIZER(Purple Top)SARS-COV-2 Vaccination 06/08/2019, 07/01/2019  . Pneumococcal Conjugate-13 10/25/2013  . Pneumococcal Polysaccharide-23 10/19/2012  . Td 06/01/2005   Keep all routine maintenance appointments.   Follow up 11/08/20 @ 11:00  Advanced directives: not yet completed.  Follow up in one year for your annual wellness visit.   Preventive Care 21 Years and Older, Female Preventive care refers to lifestyle choices and visits with your health care provider that can promote health and wellness. What does preventive care include?  A yearly physical exam. This is also called an annual well check.  Dental exams once or  twice a year.  Routine eye exams. Ask your health care provider how often you should have your eyes checked.  Personal lifestyle choices, including:  Daily care of your teeth and gums.  Regular physical activity.  Eating a healthy diet.  Avoiding tobacco and drug use.  Limiting alcohol use.  Practicing safe sex.  Taking low-dose aspirin every day.  Taking vitamin and mineral supplements as recommended by your health care provider. What happens during an annual well check? The services and screenings done by your health care provider during your annual well check will depend on your age, overall health, lifestyle risk factors, and family history of disease. Counseling  Your health care provider may ask you questions about your:  Alcohol use.  Tobacco use.  Drug use.  Emotional well-being.  Home and relationship well-being.  Sexual activity.  Eating habits.  History of falls.  Memory and ability to understand (cognition).  Work and work Statistician.  Reproductive health. Screening  You may have the following tests or measurements:  Height, weight, and BMI.  Blood pressure.  Lipid and cholesterol levels. These may be checked every 5 years, or more frequently if you are over 51 years old.  Skin check.  Lung cancer screening. You may have this screening every year starting at age 27 if you have a 30-pack-year history of smoking and currently smoke or have quit within the past 15 years.  Fecal occult blood test (FOBT) of the stool. You may have this test every  year starting at age 60.  Flexible sigmoidoscopy or colonoscopy. You may have a sigmoidoscopy every 5 years or a colonoscopy every 10 years starting at age 44.  Hepatitis C blood test.  Hepatitis B blood test.  Sexually transmitted disease (STD) testing.  Diabetes screening. This is done by checking your blood sugar (glucose) after you have not eaten for a while (fasting). You may have this done  every 1-3 years.  Bone density scan. This is done to screen for osteoporosis. You may have this done starting at age 44.  Mammogram. This may be done every 1-2 years. Talk to your health care provider about how often you should have regular mammograms. Talk with your health care provider about your test results, treatment options, and if necessary, the need for more tests. Vaccines  Your health care provider may recommend certain vaccines, such as:  Influenza vaccine. This is recommended every year.  Tetanus, diphtheria, and acellular pertussis (Tdap, Td) vaccine. You may need a Td booster every 10 years.  Zoster vaccine. You may need this after age 42.  Pneumococcal 13-valent conjugate (PCV13) vaccine. One dose is recommended after age 47.  Pneumococcal polysaccharide (PPSV23) vaccine. One dose is recommended after age 20. Talk to your health care provider about which screenings and vaccines you need and how often you need them. This information is not intended to replace advice given to you by your health care provider. Make sure you discuss any questions you have with your health care provider. Document Released: 06/14/2015 Document Revised: 02/05/2016 Document Reviewed: 03/19/2015 Elsevier Interactive Patient Education  2017 [employer] Prevention in the Home Falls can cause injuries. They can happen to people of all ages. There are many things you can do to make your home safe and to help prevent falls. What can I do on the outside of my home?  Regularly fix the edges of walkways and driveways and fix any cracks.  Remove anything that might make you trip as you walk through a door, such as a raised step or threshold.  Trim any bushes or trees on the path to your home.  Use bright outdoor lighting.  Clear any walking paths of anything that might make someone trip, such as rocks or tools.  Regularly check to see if handrails are loose or broken. Make sure that both  sides of any steps have handrails.  Any raised decks and porches should have guardrails on the edges.  Have any leaves, snow, or ice cleared regularly.  Use sand or salt on walking paths during winter.  Clean up any spills in your garage right away. This includes oil or grease spills. What can I do in the bathroom?  Use night lights.  Install grab bars by the toilet and in the tub and shower. Do not use towel bars as grab bars.  Use non-skid mats or decals in the tub or shower.  If you need to sit down in the shower, use a plastic, non-slip stool.  Keep the floor dry. Clean up any water that spills on the floor as soon as it happens.  Remove soap buildup in the tub or shower regularly.  Attach bath mats securely with double-sided non-slip rug tape.  Do not have throw rugs and other things on the floor that can make you trip. What can I do in the bedroom?  Use night lights.  Make sure that you have a light by your bed that is easy to reach.  Do  not use any sheets or blankets that are too big for your bed. They should not hang down onto the floor.  Have a firm chair that has side arms. You can use this for support while you get dressed.  Do not have throw rugs and other things on the floor that can make you trip. What can I do in the kitchen?  Clean up any spills right away.  Avoid walking on wet floors.  Keep items that you use a lot in easy-to-reach places.  If you need to reach something above you, use a strong step stool that has a grab bar.  Keep electrical cords out of the way.  Do not use floor polish or wax that makes floors slippery. If you must use wax, use non-skid floor wax.  Do not have throw rugs and other things on the floor that can make you trip. What can I do with my stairs?  Do not leave any items on the stairs.  Make sure that there are handrails on both sides of the stairs and use them. Fix handrails that are broken or loose. Make sure that  handrails are as long as the stairways.  Check any carpeting to make sure that it is firmly attached to the stairs. Fix any carpet that is loose or worn.  Avoid having throw rugs at the top or bottom of the stairs. If you do have throw rugs, attach them to the floor with carpet tape.  Make sure that you have a light switch at the top of the stairs and the bottom of the stairs. If you do not have them, ask someone to add them for you. What else can I do to help prevent falls?  Wear shoes that:  Do not have high heels.  Have rubber bottoms.  Are comfortable and fit you well.  Are closed at the toe. Do not wear sandals.  If you use a stepladder:  Make sure that it is fully opened. Do not climb a closed stepladder.  Make sure that both sides of the stepladder are locked into place.  Ask someone to hold it for you, if possible.  Clearly mark and make sure that you can see:  Any grab bars or handrails.  First and last steps.  Where the edge of each step is.  Use tools that help you move around (mobility aids) if they are needed. These include:  Canes.  Walkers.  Scooters.  Crutches.  Turn on the lights when you go into a dark area. Replace any light bulbs as soon as they burn out.  Set up your furniture so you have a clear path. Avoid moving your furniture around.  If any of your floors are uneven, fix them.  If there are any pets around you, be aware of where they are.  Review your medicines with your doctor. Some medicines can make you feel dizzy. This can increase your chance of falling. Ask your doctor what other things that you can do to help prevent falls. This information is not intended to replace advice given to you by your health care provider. Make sure you discuss any questions you have with your health care provider. Document Released: 03/14/2009 Document Revised: 10/24/2015 Document Reviewed: 06/22/2014 Elsevier Interactive Patient Education  2017  [employer].

## 2020-08-16 NOTE — Progress Notes (Signed)
Subjective:   Dana Obrien is a 75 y.o. female who presents for Medicare Annual (Subsequent) preventive examination.  Review of Systems    No ROS.  Medicare Wellness Virtual Visit.   Cardiac Risk Factors include: advanced age (>81mn, >>37women)     Objective:    Today's Vitals   08/16/20 0903  Weight: 184 lb (83.5 kg)  Height: 5' 4"  (1.626 m)   Body mass index is 31.58 kg/m.  Advanced Directives 08/16/2020 07/16/2020 07/15/2020 05/20/2020 03/28/2020 10/04/2019 08/16/2019  Does Patient Have a Medical Advance Directive? No No No No No No No  Would patient like information on creating a medical advance directive? No - Patient declined No - Patient declined - - No - Patient declined No - Patient declined No - Patient declined    Current Medications (verified) Outpatient Encounter Medications as of 08/16/2020  Medication Sig  . albuterol (VENTOLIN HFA) 108 (90 Base) MCG/ACT inhaler Inhale 2 puffs into the lungs every 6 (six) hours as needed for wheezing or shortness of breath.  . ALPRAZolam (XANAX) 0.25 MG tablet Take 1 tablet (0.25 mg total) by mouth 2 (two) times daily as needed for anxiety.  . celecoxib (CELEBREX) 100 MG capsule Take 1 capsule (100 mg total) by mouth 2 (two) times daily as needed for moderate pain.  . cetirizine (ZYRTEC) 10 MG tablet Take 1 tablet by mouth once daily  . diclofenac Sodium (VOLTAREN) 1 % GEL Apply 2 g topically 4 (four) times daily as needed.  .Marland KitchenFLUoxetine (PROZAC) 20 MG tablet Take 2 tablets (40 mg total) by mouth daily.  . fluticasone (FLONASE) 50 MCG/ACT nasal spray Place 1 spray into both nostrils daily.  . hydrochlorothiazide (HYDRODIURIL) 25 MG tablet Take 1 tablet (25 mg total) by mouth daily.  . hydrOXYzine (ATARAX/VISTARIL) 10 MG tablet Take 1 tablet (10 mg total) by mouth 3 (three) times daily as needed for itching.  . Multiple Vitamin (MULTI-VITAMIN) tablet Take 1 tablet by mouth daily.   . Omega-3 Fatty Acids (FISH OIL PO) Take by  mouth daily.  . ondansetron (ZOFRAN ODT) 4 MG disintegrating tablet Allow 1-2 tablets to dissolve in your mouth every 8 hours as needed for nausea/vomiting  . pantoprazole (PROTONIX) 40 MG tablet Take 1 tablet (40 mg total) by mouth daily.  . rosuvastatin (CRESTOR) 40 MG tablet Take 1 tablet (40 mg total) by mouth daily.  .Marland Kitchensenna (SENOKOT) 8.6 MG TABS tablet Take 1 tablet by mouth at bedtime.   . SUMAtriptan (IMITREX) 20 MG/ACT nasal spray Place 1 spray (20 mg total) into the nose as needed for migraine or headache. May repeat in 2 hours if headache persists or recurs.  . traMADol (ULTRAM) 50 MG tablet Take 1 tablet (50 mg total) by mouth every 6 (six) hours as needed.  . traZODone (DESYREL) 50 MG tablet TAKE 1/2 TO 1 (ONE-HALF TO ONE) TABLET BY MOUTH AT BEDTIME AS NEEDED FOR SLEEP  . tretinoin (RETIN-A) 0.05 % cream Apply 1 application topically at bedtime.  . trolamine salicylate (ASPERCREME) 10 % cream Apply 1 application topically as needed for muscle pain.   No facility-administered encounter medications on file as of 08/16/2020.    Allergies (verified) Lidocaine, Codeine, Morphine, Niacin, and Niacin and related   History: Past Medical History:  Diagnosis Date  . Anxiety   . Arthritis   . Asthma   . Breast cancer ([facility] 2004   right breast  . [place]   left breast  .  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  . Cancer [facility]) 2004   right lumpectomy, chemotherapy and radiation Dr. Jeb Levering  . CPAP (continuous positive airway pressure) dependence   . Depression   . GERD (gastroesophageal reflux disease)   . Hypertension   . Migraine   . Personal history of chemotherapy 2004  . Personal history of radiation therapy 2004  . Personal history of radiation therapy 2019  . PONV (postoperative nausea and vomiting)   . Shingles   . Sleep apnea    uses CPAP, severe OSA  .  Vitamin D deficiency    IN THE PAST   Past Surgical History:  Procedure Laterality Date  . ABDOMINAL HYSTERECTOMY    . ANTERIOR AND POSTERIOR REPAIR    . BREAST BIOPSY Right 2004   stereo. infiltrating ductal carcinoma  . BREAST BIOPSY Left 09/07/2017   US guided biopsy/positive- invasaive mammary carcinoma  . 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  3.5 cm maximum diameter, minimal margins 5 mm.  In situ component less than 10%.  Marland Kitchen BREAST LUMPECTOMY Right 2004  . BREAST LUMPECTOMY Left 09/17/2017   Procedure: BREAST EXCISION, SENTINEL NODE BIOPSY;  Surgeon: Robert Bellow, MD;  Location: [facility] ORS;  Service: General;  Laterality: Left;  . COLONOSCOPY  2015  . COLONOSCOPY WITH PROPOFOL N/A 10/04/2019   Procedure: COLONOSCOPY WITH PROPOFOL;  Surgeon: Robert Bellow, MD;  Location: [facility] ENDOSCOPY;  Service: Endoscopy;  Laterality: N/A;  . DILATION AND CURETTAGE OF UTERUS    . ETHMOIDECTOMY Bilateral 11/05/2016   Procedure: ETHMOIDECTOMY;  Surgeon: Melida Quitter, MD;  Location: [place];  Service: ENT;  Laterality: Bilateral;  . LAPAROTOMY    . MAXILLARY ANTROSTOMY Bilateral 11/05/2016   Procedure: MAXILLARY ANTROSTOMY;  Surgeon: Melida Quitter, MD;  Location: [place];  Service: ENT;  Laterality: Bilateral;  . SINUS ENDO W/FUSION Bilateral 11/05/2016   Procedure: FRONTAL RECESS EXPLORATION;  Surgeon: Melida Quitter, MD;  Location: [place];  Service: ENT;  Laterality: Bilateral;  BILATERAL ENDOSCOPIC SINUS SURGERY WITH FUSION  . SINUS EXPLORATION  11/05/2016  . SPHENOIDECTOMY Bilateral 11/05/2016   Procedure: SPHENOIDECTOMY;  Surgeon: Melida Quitter, MD;  Location: [facility] OR;  Service: ENT;  Laterality: Bilateral;   Family History  Problem Relation Age of Onset  . Heart disease Mother   . Hyperlipidemia Mother   . Hypertension Mother   . Hypertension Father   . Osteoarthritis Father   . Stroke Father   . Alzheimer's disease Sister    . Alzheimer's disease Brother   . Brain cancer Brother   . Healthy Daughter   . Healthy Son   . Breast cancer Neg Hx    Social History   Socioeconomic History  . Marital status: Widowed    Spouse name: Not on file  . Number of children: Not on file  . Years of education: Not on file  . Highest education level: Not on file  Occupational History  . Not on file  Tobacco Use  . Smoking status: Never Smoker  . Smokeless tobacco: Never Used  Vaping Use  . Vaping Use: Never used  Substance and Sexual Activity  . Alcohol use: Yes    Comment: rarely  . Drug use: No  . Sexual activity: Not Currently  Other Topics Concern  . Not on file  Social History Narrative   Daily Caffeine  Use:  1-2 coffee   Regular Exercise -  NO   Social Determinants of Health   Financial Resource Strain: Low Risk   . Difficulty of Paying Living Expenses: Not hard at all  Food Insecurity: No Food Insecurity  . Worried About Charity fundraiser in the Last Year: Never true  . Ran Out of Food in the Last Year: Never true  Transportation Needs: No Transportation Needs  . Lack of Transportation (Medical): No  . Lack of Transportation (Non-Medical): No  Physical Activity: Unknown  . Days of Exercise per Week: 0 days  . Minutes of Exercise per Session: Not on file  Stress: No Stress Concern Present  . Feeling of Stress : Not at all  Social Connections: Unknown  . Frequency of Communication with Friends and Family: More than three times a week  . Frequency of Social Gatherings with Friends and Family: More than three times a week  . Attends Religious Services: More than 4 times per year  . Active Member of Clubs or Organizations: Not on file  . Attends Archivist Meetings: Not on file  . Marital Status: Widowed    Tobacco Counseling Counseling given: Not Answered   Clinical Intake:  Pre-visit preparation completed: Yes        Diabetes: No  How often do you need to have someone  help you when you read instructions, pamphlets, or other written materials from your doctor or pharmacy?: 1 - Never   Interpreter Needed?: No      Activities of Daily Living In your present state of health, do you have any difficulty performing the following activities: 08/16/2020  Hearing? Y  Comment Hearing aids  Vision? N  Difficulty concentrating or making decisions? N  Walking or climbing stairs? N  Dressing or bathing? N  Doing errands, shopping? N  Preparing Food and eating ? N  Using the Toilet? N  In the past six months, have you accidently leaked urine? N  Do you have problems with loss of bowel control? N  Managing your Medications? N  Managing your Finances? N  Housekeeping or managing your Housekeeping? N  Some recent data might be hidden    Patient Care Team: Leone Haven, MD as PCP - General (Family Medicine)  Indicate any recent Medical Services you may have received from other than [facility] providers in the past year (date may be approximate).     Assessment:   This is a routine wellness examination for [place].  I connected with Sonia Baller today by telephone and verified that I am speaking with the correct person using two identifiers. Location patient: home Location provider: work Persons participating in the virtual visit: patient, Marine scientist.    I discussed the limitations, risks, security and privacy concerns of performing an evaluation and management service by telephone and the availability of in person appointments. The patient expressed understanding and verbally consented to this telephonic visit.    Interactive audio and video telecommunications were attempted between this provider and patient, however failed, due to patient having technical difficulties OR patient did not have access to video capability.  We continued and completed visit with audio only.  Some vital signs may be absent or patient reported.   Hearing/Vision screen  Hearing Screening    125Hz  250Hz  500Hz  1000Hz  2000Hz  3000Hz  4000Hz  6000Hz  8000Hz   Right ear:           Left ear:           Comments:  Hearing aid, bilateral   Vision Screening Comments: Visual acuity not assessed, virtual visit. They have seen their ophthalmologist in the last 12 months.   Dietary issues and exercise activities discussed: Current Exercise Habits: The patient does not participate in regular exercise at present  Goals      Patient Stated   .  Increase physical activity (pt-stated)      Walk for exercise as tolerated.      Depression Screen PHQ 2/9 Scores 08/16/2020 08/07/2020 02/23/2020 12/15/2019 08/16/2019 08/14/2019 05/17/2019  PHQ - 2 Score 1 0 0 0 0 0 0  PHQ- 9 Score - - - - - - -    Fall Risk Fall Risk  08/16/2020 08/07/2020 02/23/2020 12/15/2019 08/16/2019  Falls in the past year? 1 1 0 1 0  Comment none since last reported - - - -  Number falls in past yr: - 0 0 0 -  Injury with Fall? - 1 - 0 -  Follow up - Falls evaluation completed Falls evaluation completed Falls evaluation completed Falls evaluation completed    [place]: Handrails in use when climbing stairs? Yes Home free of loose throw rugs in walkways, pet beds, electrical cords, etc? Yes  Adequate lighting in your home to reduce risk of falls? Yes   ASSISTIVE DEVICES UTILIZED TO PREVENT FALLS: Use of a cane, walker or w/c? No    TIMED UP AND GO: Was the test performed? No .   Cognitive Function:      6CIT Screen 08/16/2019 08/09/2018  What Year? 0 points 0 points  What month? 0 points 0 points  What time? 0 points 0 points  Count back from 20 0 points 0 points  Months in reverse 0 points 0 points  Repeat phrase - 0 points  Total Score - 0    Immunizations Immunization History  Administered Date(s) Administered  . Fluad Quad(high Dose 65+) 02/23/2020  . Influenza Whole 06/04/2008  . Influenza, High Dose Seasonal PF 08/11/2017, 05/30/2018  . Influenza,inj,Quad PF,6+ Mos  03/06/2014  . Influenza-Unspecified 03/21/2012, 03/01/2013, 04/30/2015  . PFIZER(Purple Top)SARS-COV-2 Vaccination 06/08/2019, 07/01/2019  . Pneumococcal Conjugate-13 10/25/2013  . Pneumococcal Polysaccharide-23 10/19/2012  . Td 06/01/2005   TDAP status: Due, Education has been provided regarding the importance of this vaccine. Advised may receive this vaccine at local pharmacy or Health Dept. Aware to provide a copy of the vaccination record if obtained from local pharmacy or Health Dept. Verbalized acceptance and understanding. Deferred.   Health Maintenance Health Maintenance  Topic Date Due  . COVID-19 Vaccine (3 - Pfizer risk 4-dose series) 11/08/2020 (Originally 07/29/2019)  . TETANUS/TDAP  08/16/2021 (Originally 06/02/2015)  . MAMMOGRAM  12/12/2021  . COLONOSCOPY (Pts 45-65yr Insurance coverage will need to be confirmed)  10/03/2024  . INFLUENZA VACCINE  Completed  . DEXA SCAN  Completed  . Hepatitis C Screening  Completed  . HPV VACCINES  Aged Out   Colorectal cancer screening: Type of screening: Colonoscopy. Completed 10/04/19. Repeat every 5 years  Mammogram status: Completed 12/13/19. Repeat every year. Scheduled 12/13/20.  Bone Density status: Completed 12/13/19. Results reflect: Bone density results: OSTEOPENIA. Repeat every 2 years.  Lung Cancer Screening: (Low Dose CT Chest recommended if Age 73-80years, 30 pack-year currently smoking OR have quit w/in 15years.) does not qualify.   Vision Screening: Recommended annual ophthalmology exams for early detection of glaucoma and other disorders of the eye. Is the patient up to date with their annual eye exam?  Yes   Dental Screening: Recommended annual dental exams for proper oral .  Community Resource Referral / Chronic Care Management: CRR required this visit?  No   CCM required this visit?  No      Plan:   Keep all routine maintenance appointments.   Follow up 11/08/20 @ 11:00  Patient notes she feels more sad here  and there. Requests to increase prozac another 10m. Notes no harm to self or others.  Denies changes with eating, sleeping, concentrating. Appointment offered to follow up with PCP. Declined. [facility]   I have personally reviewed and noted the following in the patient's chart:   . Medical and social history . Use of alcohol, tobacco or illicit drugs  . Current medications and supplements . Functional ability and status . Nutritional status . Physical activity . Advanced directives . List of other physicians . Hospitalizations, surgeries, and ER visits in previous 12 months . Vitals . Screenings to include cognitive, depression, and falls . Referrals and appointments  In addition, I have reviewed and discussed with patient certain preventive protocols, quality metrics, and best practice recommendations. A written personalized care plan for preventive services as well as general preventive health recommendations were provided to patient via mychart.     OVarney Biles LPN   [phone]

## 2020-09-06 ENCOUNTER — Telehealth: Payer: PPO | Admitting: Family

## 2020-09-06 DIAGNOSIS — J029 Acute pharyngitis, unspecified: Secondary | ICD-10-CM

## 2020-09-06 MED ORDER — AMOXICILLIN 500 MG PO CAPS: 500.0000 mg | ORAL_CAPSULE | Freq: Two times a day (BID) | ORAL | 0 refills | Status: DC

## 2020-09-06 NOTE — Progress Notes (Signed)
We are sorry that you are not feeling well.  Here is how we plan to help!  Based on what you have shared with me it is likely that you have strep pharyngitis.  Strep pharyngitis is inflammation and infection in the back of the throat.  This is an infection cause by bacteria and is treated with antibiotics.  I have prescribed Amoxicillin 500 mg twice a day for 10 days. For throat pain, we recommend over the counter oral pain relief medications such as acetaminophen or aspirin, or anti-inflammatory medications such as ibuprofen or naproxen sodium. Topical treatments such as oral throat lozenges or sprays may be used as needed. Strep infections are not as easily transmitted as other respiratory infections, however we still recommend that you avoid close contact with loved ones, especially the very young and elderly.  Remember to wash your hands thoroughly throughout the day as this is the number one way to prevent the spread of infection and wipe down door knobs and counters with disinfectant.   Home Care:  Only take medications as instructed by your medical team.  Complete the entire course of an antibiotic.  Do not take these medications with alcohol.  A steam or ultrasonic humidifier can help congestion.  You can place a towel over your head and breathe in the steam from hot water coming from a faucet.  Avoid close contacts especially the very young and the elderly.  Cover your mouth when you cough or sneeze.  Always remember to wash your hands.  Get Help Right Away If:  You develop worsening fever or sinus pain.  You develop a severe head ache or visual changes.  Your symptoms persist after you have completed your treatment plan.  Make sure you  Understand these instructions.  Will watch your condition.  Will get help right away if you are not doing well or get worse.  Your e-visit answers were reviewed by a board certified advanced clinical practitioner to complete your  personal care plan.  Depending on the condition, your plan could have included both over the counter or prescription medications.  If there is a problem please reply  once you have received a response from your provider.  Your safety is important to [place].  If you have drug allergies check your prescription carefully.    You can use MyChart to ask questions about today's visit, request a non-urgent call back, or ask for a work or school excuse for 24 hours related to this e-Visit. If it has been greater than 24 hours you will need to follow up with your provider, or enter a new e-Visit to address those concerns.  You will get an e-mail in the next two days asking about your experience.  I hope that your e-visit has been valuable and will speed your recovery. Thank you for using e-visits.  Approximately 5 minutes was spent documenting and reviewing patient's chart.

## 2020-09-24 ENCOUNTER — Other Ambulatory Visit: Payer: Self-pay | Admitting: Family Medicine

## 2020-09-27 ENCOUNTER — Ambulatory Visit: Payer: PPO | Admitting: Oncology

## 2020-10-18 ENCOUNTER — Other Ambulatory Visit: Payer: Self-pay | Admitting: Family Medicine

## 2020-10-18 NOTE — Telephone Encounter (Signed)
RX Refill:tramadol Last Seen:08-07-20 Last ordered:07-26-20

## 2020-10-21 ENCOUNTER — Other Ambulatory Visit: Payer: Self-pay | Admitting: Family Medicine

## 2020-11-04 ENCOUNTER — Telehealth: Payer: Self-pay | Admitting: Family Medicine

## 2020-11-04 NOTE — Telephone Encounter (Signed)
Patient called and said her insurance will only cover [facility] for a sleep study. She is requesting a referral for that location.

## 2020-11-08 ENCOUNTER — Other Ambulatory Visit: Payer: Self-pay

## 2020-11-08 ENCOUNTER — Ambulatory Visit (INDEPENDENT_AMBULATORY_CARE_PROVIDER_SITE_OTHER): Payer: PPO | Admitting: Family Medicine

## 2020-11-08 DIAGNOSIS — C50412 Malignant neoplasm of upper-outer quadrant of left female breast: Secondary | ICD-10-CM | POA: Diagnosis not present

## 2020-11-08 DIAGNOSIS — F419 Anxiety disorder, unspecified: Secondary | ICD-10-CM | POA: Diagnosis not present

## 2020-11-08 DIAGNOSIS — G8929 Other chronic pain: Secondary | ICD-10-CM | POA: Diagnosis not present

## 2020-11-08 DIAGNOSIS — Z170 Estrogen receptor positive status [ER+]: Secondary | ICD-10-CM

## 2020-11-08 DIAGNOSIS — G4733 Obstructive sleep apnea (adult) (pediatric): Secondary | ICD-10-CM | POA: Diagnosis not present

## 2020-11-08 DIAGNOSIS — M25561 Pain in right knee: Secondary | ICD-10-CM | POA: Diagnosis not present

## 2020-11-08 DIAGNOSIS — I10 Essential (primary) hypertension: Secondary | ICD-10-CM | POA: Diagnosis not present

## 2020-11-08 DIAGNOSIS — F32A Depression, unspecified: Secondary | ICD-10-CM

## 2020-11-08 NOTE — Assessment & Plan Note (Signed)
Decent control of her anxiety.  She will continue Prozac 60 mg once daily.  She can continue very rare use of Xanax.  She will monitor for recurrence of depression.

## 2020-11-08 NOTE — Assessment & Plan Note (Signed)
Acute on chronic right knee pain.  Will refer to orthopedics to consider injection.  Discussed that the landmarks in her knee were not optimal for me to do an injection.

## 2020-11-08 NOTE — Assessment & Plan Note (Signed)
She continues to follow with oncology and general surgery for surveillance.  Her mammogram is scheduled for July.

## 2020-11-08 NOTE — Patient Instructions (Signed)
Nice to see you. I have referred you to orthopedics for your knee. I have ordered another sleep study.  Once they call you please confirm with your insurance that this is covered.

## 2020-11-08 NOTE — Assessment & Plan Note (Signed)
Varying blood pressures at home though she is not checking consistently.  I have asked her to check her blood pressure once daily after 5 to 10 minutes of rest and then send me her readings in 1 week.  She will continue HCTZ 25 mg once daily.

## 2020-11-08 NOTE — Assessment & Plan Note (Signed)
We will order an in lab study. I encouraged the patient to check with her insurance to make sure this would be covered.

## 2020-11-08 NOTE — Progress Notes (Signed)
Dana Rumps, MD Phone: [phone]  Dana Obrien is a 75 y.o. female who presents today for f/u.  HYPERTENSION Disease Monitoring Home BP Monitoring only checking once a week or so, has been in to the 150s/80s though then comes down to the 408X systolic       Chest pain- no    Dyspnea- no Medications Compliance-  taking HCTZ 25 mg daily.  Edema- no  Anxiety: Patient notes this is better though has not resolved.  She does not expect it to resolve with her current home situation.  She continues on Prozac.  She only takes Xanax once a week if she feels a panicky sensation.  No depression.  No SI.  Right knee pain: This is a chronic issue though improved significantly after having a knee injection a year ago.  A week or so ago she started to have discomfort again in the lateral joint line.  Notes at times it feels as though her knee is going to give out on her due to the pain.  No fall.  No injury.  Does pop some though does not lock.  She has tried Voltaren and Celebrex with minimal benefit.  OSA: She reports she received a call regarding a home sleep study though she notes her insurance would not pay for that with the company that called her.   Social History   Tobacco Use  Smoking Status Never  Smokeless Tobacco Never    Current Outpatient Medications on File Prior to Visit  Medication Sig Dispense Refill   albuterol (VENTOLIN HFA) 108 (90 Base) MCG/ACT inhaler Inhale 2 puffs into the lungs every 6 (six) hours as needed for wheezing or shortness of breath. 8 g 0   ALPRAZolam (XANAX) 0.25 MG tablet Take 1 tablet (0.25 mg total) by mouth 2 (two) times daily as needed for anxiety. 20 tablet 0   celecoxib (CELEBREX) 100 MG capsule TAKE 1 CAPSULE BY MOUTH TWICE DAILY AS NEEDED FOR MODERATE PAIN 60 capsule 0   cetirizine (ZYRTEC) 10 MG tablet Take 1 tablet by mouth once daily 90 tablet 0   diclofenac Sodium (VOLTAREN) 1 % GEL Apply 2 g topically 4 (four) times daily as needed.      FLUoxetine 60 MG TABS Take 60 mg by mouth daily. 90 tablet 1   fluticasone (FLONASE) 50 MCG/ACT nasal spray Place 1 spray into both nostrils daily. 16 g 5   hydrochlorothiazide (HYDRODIURIL) 25 MG tablet Take 1 tablet by mouth once daily 90 tablet 0   hydrOXYzine (ATARAX/VISTARIL) 10 MG tablet Take 1 tablet (10 mg total) by mouth 3 (three) times daily as needed for itching. 90 tablet 1   Multiple Vitamin (MULTI-VITAMIN) tablet Take 1 tablet by mouth daily.      Omega-3 Fatty Acids (FISH OIL PO) Take by mouth daily.     ondansetron (ZOFRAN ODT) 4 MG disintegrating tablet Allow 1-2 tablets to dissolve in your mouth every 8 hours as needed for nausea/vomiting 30 tablet 0   pantoprazole (PROTONIX) 40 MG tablet Take 1 tablet (40 mg total) by mouth daily. 90 tablet 2   rosuvastatin (CRESTOR) 40 MG tablet Take 1 tablet (40 mg total) by mouth daily. 90 tablet 1   senna (SENOKOT) 8.6 MG TABS tablet Take 1 tablet by mouth at bedtime.      SUMAtriptan (IMITREX) 20 MG/ACT nasal spray Place 1 spray (20 mg total) into the nose as needed for migraine or headache. May repeat in 2 hours if headache persists  or recurs. 1 each 2   traMADol (ULTRAM) 50 MG tablet TAKE 1 TABLET BY MOUTH EVERY 6 HOURS AS NEEDED 30 tablet 0   traZODone (DESYREL) 50 MG tablet TAKE 1/2 TO 1 (ONE-HALF TO ONE) TABLET BY MOUTH AT BEDTIME AS NEEDED FOR SLEEP 30 tablet 0   tretinoin (RETIN-A) 0.05 % cream Apply 1 application topically at bedtime. 45 g 0   trolamine salicylate (ASPERCREME) 10 % cream Apply 1 application topically as needed for muscle pain.     No current facility-administered medications on file prior to visit.     ROS see history of present illness  Objective  Physical Exam Vitals:   11/08/20 1057  BP: 140/80  Pulse: 71  Temp: 98.6 F (37 C)  SpO2: 98%    BP Readings from Last 3 Encounters:  11/08/20 140/80  08/07/20 130/80  07/23/20 (!) 142/89   Wt Readings from Last 3 Encounters:  11/08/20 190 lb 6.4  oz (86.4 kg)  08/16/20 184 lb (83.5 kg)  08/07/20 184 lb 12.8 oz (83.8 kg)    Physical Exam Constitutional:      General: She is not in acute distress.    Appearance: She is not diaphoretic.  Cardiovascular:     Rate and Rhythm: Normal rate and regular rhythm.     Heart sounds: Normal heart sounds.  Pulmonary:     Effort: Pulmonary effort is normal.     Breath sounds: Normal breath sounds.  Musculoskeletal:     Comments: Right knee with lateral joint line tenderness, there is prominent fatty tissue over the medial joint line bilaterally, there is no warmth, swelling, or erythema of either knee, no tenderness of the left knee, negative McMurray's bilaterally  Skin:    General: Skin is warm and dry.  Neurological:     Mental Status: She is alert.     Assessment/Plan: Please see individual problem list.  Problem List Items Addressed This Visit     Anxiety and depression    Decent control of her anxiety.  She will continue Prozac 60 mg once daily.  She can continue very rare use of Xanax.  She will monitor for recurrence of depression.       Hypertension    Varying blood pressures at home though she is not checking consistently.  I have asked her to check her blood pressure once daily after 5 to 10 minutes of rest and then send me her readings in 1 week.  She will continue HCTZ 25 mg once daily.       OSA (obstructive sleep apnea)    We will order an in lab study. I encouraged the patient to check with her insurance to make sure this would be covered.       Relevant Orders   Split night study   Malignant neoplasm of upper-outer quadrant of left breast in female, estrogen receptor positive ([place])    She continues to follow with oncology and general surgery for surveillance.  Her mammogram is scheduled for July.       Right knee pain    Acute on chronic right knee pain.  Will refer to orthopedics to consider injection.  Discussed that the landmarks in her knee were not  optimal for me to do an injection.       Relevant Orders   Ambulatory referral to Orthopedic Surgery    Return in about 3 months (around 02/08/2021) for Hypertension, anxiety.  This visit occurred during the SARS-CoV-2 public health  emergency.  Safety protocols were in place, including screening questions prior to the visit, additional usage of staff PPE, and extensive cleaning of exam room while observing appropriate contact time as indicated for disinfecting solutions.    Dana Rumps, MD [place]

## 2020-11-11 ENCOUNTER — Encounter: Payer: Self-pay | Admitting: Family Medicine

## 2020-11-11 ENCOUNTER — Other Ambulatory Visit: Payer: Self-pay | Admitting: Family Medicine

## 2020-11-11 MED ORDER — ALPRAZOLAM 0.25 MG PO TABS: 0.2500 mg | ORAL_TABLET | Freq: Two times a day (BID) | ORAL | 0 refills | Status: DC | PRN

## 2020-11-11 NOTE — Telephone Encounter (Signed)
I do not typically prescribe retin-A. That has to come from a dermatologist. Has she been following with a dermatologist?

## 2020-11-11 NOTE — Addendum Note (Signed)
Addended by: Leone Haven on: 11/11/2020 04:56 PM   Modules accepted: Orders

## 2020-11-15 ENCOUNTER — Other Ambulatory Visit: Payer: Self-pay | Admitting: Family Medicine

## 2020-11-18 ENCOUNTER — Ambulatory Visit: Payer: PPO | Admitting: Orthopedic Surgery

## 2020-11-18 ENCOUNTER — Other Ambulatory Visit: Payer: Self-pay

## 2020-11-18 DIAGNOSIS — M1711 Unilateral primary osteoarthritis, right knee: Secondary | ICD-10-CM

## 2020-11-19 ENCOUNTER — Encounter: Payer: Self-pay | Admitting: Family Medicine

## 2020-11-20 ENCOUNTER — Ambulatory Visit: Payer: PPO | Admitting: Orthopedic Surgery

## 2020-11-20 MED ORDER — SUMATRIPTAN SUCCINATE 50 MG PO TABS: 50.0000 mg | ORAL_TABLET | ORAL | 0 refills | Status: DC | PRN

## 2020-11-24 ENCOUNTER — Other Ambulatory Visit: Payer: Self-pay | Admitting: Family Medicine

## 2020-11-24 ENCOUNTER — Encounter: Payer: Self-pay | Admitting: Orthopedic Surgery

## 2020-11-24 MED ORDER — LIDOCAINE HCL 1 % IJ SOLN
5.0000 mL | INTRAMUSCULAR | Status: AC | PRN
  Administered 2020-11-18: 5 mL

## 2020-11-24 MED ORDER — METHYLPREDNISOLONE ACETATE 40 MG/ML IJ SUSP
40.0000 mg | INTRAMUSCULAR | Status: AC | PRN
  Administered 2020-11-18: 40 mg via INTRA_ARTICULAR

## 2020-11-24 MED ORDER — BUPIVACAINE HCL 0.25 % IJ SOLN
4.0000 mL | INTRAMUSCULAR | Status: AC | PRN
  Administered 2020-11-18: 4 mL via INTRA_ARTICULAR

## 2020-11-24 NOTE — Progress Notes (Signed)
Office Visit Note   Patient: Dana Obrien           Date of Birth: [birth year]           MRN: [id number] Visit Date: 11/18/2020 Requested by: Leone Haven, MD [address] [place],  [place] [address] PCP: Leone Haven, MD  Subjective: Chief Complaint  Patient presents with   Left Knee - Pain    HPI: Dana Obrien is a 75 year old patient with right knee pain.  Prior injections have given her good relief.  She would like a repeat injection.  Last office visit 921.  Those old notes are reviewed.  No waking from sleep.              ROS: All systems reviewed are negative as they relate to the chief complaint within the history of present illness.  Patient denies  fevers or chills.   Assessment & Plan: Visit Diagnoses:  1. Unilateral primary osteoarthritis, right knee     Plan: Impression is right knee arthritis with good relief from injections in the past.  Plan is right knee injection.  Continue with nonweightbearing quad strengthening exercises and follow-up as needed  Follow-Up Instructions: Return if symptoms worsen or fail to improve.   Orders:  No orders of the defined types were placed in this encounter.  No orders of the defined types were placed in this encounter.     Procedures: Large Joint Inj: R knee on 11/18/2020 12:13 PM Indications: diagnostic evaluation, joint swelling and pain Details: 18 G 1.5 in needle, superolateral approach  Arthrogram: No  Medications: 5 mL lidocaine 1 %; 40 mg methylPREDNISolone acetate 40 MG/ML; 4 mL bupivacaine 0.25 % Outcome: tolerated well, no immediate complications Procedure, treatment alternatives, risks and benefits explained, specific risks discussed. Consent was given by the patient. Immediately prior to procedure a time out was called to verify the correct patient, procedure, equipment, support staff and site/side marked as required. Patient was prepped and draped in the usual sterile fashion.       Clinical Data: No additional findings.  Objective: Vital Signs: There were no vitals taken for this visit.  Physical Exam:   Constitutional: Patient appears well-developed HEENT:  Head: Normocephalic Eyes:EOM are normal Neck: Normal range of motion Cardiovascular: Normal rate Pulmonary/chest: Effort normal Neurologic: Patient is alert Skin: Skin is warm Psychiatric: Patient has normal mood and affect   Ortho Exam: Ortho exam demonstrates.  Retinacular tenderness in the right knee with medial and lateral joint line tenderness.  Extensor mechanism is intact.  Pedal pulses palpable.  Collaterals are stable.  No groin pain with internal ex rotation of the leg.  Range of motion is diminished.  Some patellofemoral crepitus is present.  Specialty Comments:  No specialty comments available.  Imaging: No results found.   PMFS History: Patient Active Problem List   Diagnosis Date Noted   Right knee pain 11/08/2020   Concussion with no loss of consciousness 08/08/2020   Vitamin D deficiency 08/08/2020   Gastroesophageal reflux disease without esophagitis 03/25/2020   Dermatitis due to plants, including poison ivy, sumac, and oak 02/29/2020   Neuropathy 12/15/2019   Allergic rhinitis 07/05/2019   Brain atrophy ([place]) 05/18/2019   Bilateral hearing loss 08/15/2018   Decreased energy 08/15/2018   Prediabetes 06/02/2018   Arthralgia 06/02/2018   BPPV (benign paroxysmal positional vertigo), right 03/11/2018   Dysfunction of right eustachian tube 03/11/2018   Malignant neoplasm of upper-outer quadrant of left breast  in female, estrogen receptor positive ([place]) 09/14/2017   Radiculopathy 08/24/2017   Fatty liver 08/24/2017   Abdominal pain 08/24/2017   Hearing difficulty of both ears 06/25/2017   Chronic sinusitis 11/05/2016   Bilateral sensorineural hearing loss 10/13/2016   Right ear impacted cerumen 10/13/2016   GERD (gastroesophageal reflux disease) 09/07/2016    Exertional shortness of breath 01/15/2016   Uterine prolapse 11/14/2015   OSA (obstructive sleep apnea) 11/01/2015   Back muscle spasm 04/30/2015   DDD (degenerative disc disease), lumbar 04/30/2015   Lumbar radiculitis 04/30/2015   Low back pain 01/01/2015   Varicose vein of leg 11/22/2014   Post herpetic neuralgia 03/06/2014   Increased frequency of urination 11/17/2013   Rectocele 11/17/2013   Cystocele 10/25/2013   Skin lesion 10/25/2013   Disorder of skin and subcutaneous tissue 10/25/2013   Midline cystocele 10/25/2013   Pelvic and perineal pain 10/25/2013   Obesity, unspecified 10/19/2012   History of breast cancer 02/11/2012   Hyperlipidemia 05/11/2011   Osteopenia 05/11/2011   Disorder of bone and cartilage 05/11/2011   Anxiety and depression 03/04/2011   Hypertension 03/04/2011   Past Medical History:  Diagnosis Date   Anxiety    Arthritis    Asthma    Breast cancer ([place]) 2004   right breast   Breast cancer ([place]) 09/08/2017   left breast   Breast cancer of upper-outer quadrant of left female breast ([place]) 09/17/2017   T1c, N0, ER 90%, PR 90%, 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   Cancer [facility]) 2004   right lumpectomy, chemotherapy and radiation Dr. Jeb Levering   CPAP (continuous positive airway pressure) dependence    Depression    GERD (gastroesophageal reflux disease)    Hypertension    Migraine    Personal history of chemotherapy 2004   Personal history of radiation therapy 2004   Personal history of radiation therapy 2019   PONV (postoperative nausea and vomiting)    Shingles    Sleep apnea    uses CPAP, severe OSA   Vitamin D deficiency    IN THE PAST    Family History  Problem Relation Age of Onset   Heart disease Mother    Hyperlipidemia Mother    Hypertension Mother    Hypertension Father    Osteoarthritis Father    Stroke Father    Alzheimer's disease Sister    Alzheimer's disease Brother    Brain cancer Brother    Healthy Daughter    Healthy Son     Breast cancer Neg Hx     Past Surgical History:  Procedure Laterality Date   ABDOMINAL HYSTERECTOMY     ANTERIOR AND POSTERIOR REPAIR     BREAST BIOPSY Right 2004   stereo. 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  3.5 cm maximum diameter, minimal margins 5 mm.  In situ component less than 10%.   BREAST LUMPECTOMY Right 2004   BREAST LUMPECTOMY Left 09/17/2017   Procedure: BREAST EXCISION, SENTINEL NODE BIOPSY;  Surgeon: Robert Bellow, MD;  Location: [facility] ORS;  Service: General;  Laterality: Left;   COLONOSCOPY  2015   COLONOSCOPY WITH PROPOFOL N/A 10/04/2019   Procedure: COLONOSCOPY WITH PROPOFOL;  Surgeon: Robert Bellow, MD;  Location: [facility] END[facility];  Service: Endoscopy;  Laterality: N/A;   DILATION AND CURETTAGE OF UTERUS     ETHMOIDECTOMY Bilateral 11/05/2016  Procedure: ETHMOIDECTOMY;  Surgeon: Melida Quitter, MD;  Location: [facility];  Service: ENT;  Laterality: Bilateral;   LAPAROTOMY     MAXILLARY ANTROSTOMY Bilateral 11/05/2016   Procedure: MAXILLARY ANTROSTOMY;  Surgeon: Melida Quitter, MD;  Location: [facility];  Service: ENT;  Laterality: Bilateral;   SINUS ENDO W/FUSION Bilateral 11/05/2016   Procedure: FRONTAL RECESS EXPLORATION;  Surgeon: Melida Quitter, MD;  Location: [facility];  Service: ENT;  Laterality: Bilateral;  BILATERAL ENDOSCOPIC SINUS SURGERY WITH FUSION   SINUS EXPLORATION  11/05/2016   SPHENOIDECTOMY Bilateral 11/05/2016   Procedure: SPHENOIDECTOMY;  Surgeon: Melida Quitter, MD;  Location: [facility] OR;  Service: ENT;  Laterality: Bilateral;   Social History   Occupational History   Not on file  Tobacco Use   Smoking status: Never   Smokeless tobacco: Never  Vaping Use   Vaping Use: Never used  Substance and Sexual Activity   Alcohol use: Yes    Comment: rarely   Drug use: No    Sexual activity: Not Currently

## 2020-11-29 ENCOUNTER — Other Ambulatory Visit
Admission: RE | Admit: 2020-11-29 | Discharge: 2020-11-29 | Disposition: A | Discharge status: Home / Self Care | Payer: PPO | Source: Ambulatory Visit | Attending: Family Medicine | Admitting: Family Medicine

## 2020-11-29 ENCOUNTER — Other Ambulatory Visit: Payer: Self-pay

## 2020-11-29 DIAGNOSIS — Z20822 Contact with and (suspected) exposure to covid-19: Secondary | ICD-10-CM | POA: Insufficient documentation

## 2020-11-29 DIAGNOSIS — Z01812 Encounter for preprocedural laboratory examination: Secondary | ICD-10-CM | POA: Diagnosis not present

## 2020-11-29 LAB — SARS CORONAVIRUS 2 (TAT 6-24 HRS): SARS Coronavirus 2: NEGATIVE

## 2020-12-04 ENCOUNTER — Ambulatory Visit: Payer: PPO | Attending: Neurology

## 2020-12-04 DIAGNOSIS — G4733 Obstructive sleep apnea (adult) (pediatric): Secondary | ICD-10-CM | POA: Diagnosis not present

## 2020-12-06 ENCOUNTER — Other Ambulatory Visit: Payer: Self-pay

## 2020-12-11 ENCOUNTER — Telehealth: Payer: Self-pay

## 2020-12-11 DIAGNOSIS — G4733 Obstructive sleep apnea (adult) (pediatric): Secondary | ICD-10-CM

## 2020-12-13 ENCOUNTER — Ambulatory Visit
Admission: RE | Admit: 2020-12-13 | Discharge: 2020-12-13 | Disposition: A | Discharge status: Home / Self Care | Payer: PPO | Source: Ambulatory Visit | Attending: Oncology | Admitting: Oncology

## 2020-12-13 ENCOUNTER — Other Ambulatory Visit: Payer: Self-pay

## 2020-12-13 DIAGNOSIS — Z1231 Encounter for screening mammogram for malignant neoplasm of breast: Secondary | ICD-10-CM | POA: Diagnosis not present

## 2020-12-13 DIAGNOSIS — Z853 Personal history of malignant neoplasm of breast: Secondary | ICD-10-CM | POA: Diagnosis not present

## 2020-12-13 DIAGNOSIS — Z08 Encounter for follow-up examination after completed treatment for malignant neoplasm: Secondary | ICD-10-CM | POA: Diagnosis not present

## 2020-12-16 ENCOUNTER — Encounter: Payer: Self-pay | Admitting: Family Medicine

## 2020-12-16 NOTE — Telephone Encounter (Signed)
Noted.  Based on her sleep study results she meets criteria for BiPAP.  Can we get an order form for this?  Given the need for BiPAP I would like for her to see a sleep study specialist so they can assist in managing this device and I can place that referral once you speak with her.

## 2020-12-16 NOTE — Telephone Encounter (Signed)
I called and spoke with the patient and informed her that her sleep study proves she needs a Bipap and she understood.  Patient would like a copy of the sleep study results mailed to her home and she is ok with the referral to see a sleep specialist also.  Jeric Slagel,cma

## 2020-12-22 NOTE — Telephone Encounter (Signed)
Referral to pulmonology placed. I will send to [place] to get the order form for the BiPAP.

## 2020-12-22 NOTE — Addendum Note (Signed)
Addended by: Caryl Bis, Kristal Perl G on: 12/22/2020 12:50 PM   Modules accepted: Orders

## 2020-12-23 NOTE — Telephone Encounter (Signed)
I put two different order forms for Bipap in your sign basket, one for [employer] and one for [employer].  Andreea Arca,cma

## 2020-12-25 NOTE — Telephone Encounter (Signed)
Form filled out. Please fill in patient information and fax with the needed information that is at the top of the form.

## 2020-12-27 NOTE — Telephone Encounter (Signed)
Form faxed with needed information

## 2020-12-31 ENCOUNTER — Telehealth: Payer: Self-pay | Admitting: Family Medicine

## 2020-12-31 NOTE — Telephone Encounter (Signed)
We received a fax from the company that is going to provide her BiPAP.  It looks like they need her to be seen in the office after her sleep study.  Can we get her scheduled for the next available appointment?  This could be in a 15-minute slot if needed.

## 2020-12-31 NOTE — Telephone Encounter (Signed)
Patient called back and has been scheduled in a 15 min spot ok'd by Dr Caryl Bis.

## 2020-12-31 NOTE — Telephone Encounter (Signed)
I called and LVM for the patient to call back to schedule a visit with the provider in a 15 min slot if necessary.  Adrain Nesbit,cma

## 2021-01-06 ENCOUNTER — Other Ambulatory Visit: Payer: Self-pay | Admitting: Family Medicine

## 2021-01-06 NOTE — Telephone Encounter (Signed)
Alprazolam    Refilled: 11/11/2020 Tramadol   Refilled: 10/20/2020  Last OV: 11/08/2020 Next OV: 01/08/2021

## 2021-01-08 ENCOUNTER — Ambulatory Visit: Payer: PPO | Admitting: Family Medicine

## 2021-01-13 NOTE — Telephone Encounter (Signed)
Do you need to see the patient before her appointment on 01/15/21 with pulmonary?  She called and stated she is confused.  Jerrion Tabbert,cma

## 2021-01-13 NOTE — Telephone Encounter (Signed)
I do not think I have to see her prior to her seeing pulmonology.  The durable medical equipment company just requested that I see the patient after the sleep study was completed so that it can be documented in an office note that there is a medical reason for the switch to the BiPAP given that she was just placed on the CPAP somewhat recently.

## 2021-01-13 NOTE — Telephone Encounter (Signed)
Pt called and was confused about when she is supposed to see PCP. She states that she is thinks she has to see him prior to her appt on 01/15/21 with pulm? Please call back to clarify

## 2021-01-14 ENCOUNTER — Other Ambulatory Visit: Payer: Self-pay | Admitting: Family Medicine

## 2021-01-14 ENCOUNTER — Telehealth: Payer: Self-pay

## 2021-01-14 NOTE — Telephone Encounter (Signed)
I called and LVM informing the patient that the provider did not need to see her prior to seeing pulmonary.  Sylvan Lahm,cma

## 2021-01-14 NOTE — Telephone Encounter (Signed)
Called and left voice message for pt in regards to upcoming appointment, id like to know if patient has had a sleep study before and if so where. If not nothing further needed.

## 2021-01-15 ENCOUNTER — Encounter: Payer: Self-pay | Admitting: Primary Care

## 2021-01-15 ENCOUNTER — Other Ambulatory Visit: Payer: Self-pay

## 2021-01-15 ENCOUNTER — Ambulatory Visit (INDEPENDENT_AMBULATORY_CARE_PROVIDER_SITE_OTHER): Payer: PPO | Admitting: Primary Care

## 2021-01-15 VITALS — BP 130/70 | HR 74 | Temp 98.1°F | Ht 64.0 in | Wt 187.6 lb

## 2021-01-15 DIAGNOSIS — G4733 Obstructive sleep apnea (adult) (pediatric): Secondary | ICD-10-CM

## 2021-01-15 NOTE — Assessment & Plan Note (Addendum)
Patient has hx sleep apnea, stopped wearing CPAP d/t [employer] recall. She has symptoms of loud snoring, witness apnea and daytime sleepiness. Epworth score 5. She had Split night sleep study in July 2022 with her PCP's office that showed severe OSA, AHI 44/hr with SpO2 low 83%. CPAP was unable to eliminate events, requriing BIPAP pressure setting 22/19cm h20. We have placed an order for DME to provide patient with BIPAP machine at above setting. She will call our office to schedule follow-up 31-90 days after receiving device.

## 2021-01-15 NOTE — Patient Instructions (Addendum)
Split-night sleep study on 12/04/2020 that showed severe obstructive sleep apnea, recommending you be started on BIPAP therapy   Recommendations: - Aim to wear BIPAP every night for 6 hours or more - Do not drive if excessively tired   Orders: - BiPAP therapy with pressure setting of 22/19 centimeters H2O, heated humidity and ResMed F 30 I fullface mask size small.   Follow-up: - Once you get new BIPAP machine please call our office to scheduled follow-up 31-90 days starting   CPAP and BPAP Information CPAP and BPAP (also called BiPAP) are methods that use air pressure to keep your airways open and to help you breathe well. CPAP and BPAP use different amounts of pressure. Your health care provider will tell you whether CPAP or BPAP would be more helpful for you. CPAP stands for "continuous positive airway pressure." With CPAP, the amount of pressure stays the same while you breathe in (inhale) and out (exhale). BPAP stands for "bi-level positive airway pressure." With BPAP, the amount of pressure will be higher when you inhale and lower when you exhale. This allows you to take larger breaths. CPAP or BPAP may be used in the hospital, or your health care provider may want you to use it at home. You may need to have a sleep study before your healthcare provider can order a machine for you to use at home. What are the advantages? CPAP or BPAP can be helpful if you have: Sleep apnea. Chronic obstructive pulmonary disease (COPD). Heart failure. Medical conditions that cause muscle weakness, including muscular dystrophy or amyotrophic lateral sclerosis (ALS). Other problems that cause breathing to be shallow, weak, abnormal, or difficult. CPAP and BPAP are most commonly used for obstructive sleep apnea (OSA) to keepthe airways from collapsing when the muscles relax during sleep. What are the risks? Generally, this is a safe treatment. However, problems may occur, including: Irritated skin or  skin sores if the mask does not fit properly. Dry or stuffy nose or nosebleeds. Dry mouth. Feeling gassy or bloated. Sinus or lung infection if the equipment is not cleaned properly. When should CPAP or BPAP be used? In most cases, the mask only needs to be worn during sleep. Generally, the mask needs to be worn throughout the night and during any daytime naps. People with certain medical conditions may also need to wear the mask at other times, such as when they are awake. Follow instructions from your health care providerabout when to use the machine. What happens during CPAP or BPAP?  Both CPAP and BPAP are provided by a small machine with a flexible plastic tube that attaches to a plastic mask that you wear. Air is blown through the mask into your nose or mouth. The amount of pressure that is used to blow the air can be adjusted on the machine. Your health care provider will set the pressuresetting and help you find the best mask for you. Tips for using the mask Because the mask needs to be snug, some people feel trapped or closed-in (claustrophobic) when first using the mask. If you feel this way, you may need to get used to the mask. One way to do this is to hold the mask loosely over your nose or mouth and then gradually apply the mask more snugly. You can also gradually increase the amount of time that you use the mask. Masks are available in various types and sizes. If your mask does not fit well, talk with your health care provider about  getting a different one. Some common types of masks include: Full face masks, which fit over the mouth and nose. Nasal masks, which fit over the nose. Nasal pillow or prong masks, which fit into the nostrils. If you are using a mask that fits over your nose and you tend to breathe through your mouth, a chin strap may be applied to help keep your mouth closed. Use a skin barrier to protect your skin as told by your health care provider. Some CPAP and BPAP  machines have alarms that may sound if the mask comes off or develops a leak. If you have trouble with the mask, it is very important that you talk with your health care provider about finding a way to make the mask easier to tolerate. Do not stop using the mask. There could be a negative impact on your health if you stop using the mask. Tips for using the machine Place your CPAP or BPAP machine on a secure table or stand near an electrical outlet. Know where the on/off switch is on the machine. Follow instructions from your health care provider about how to set the pressure on your machine and when you should use it. Do not eat or drink while the CPAP or BPAP machine is on. Food or fluids could get pushed into your lungs by the pressure of the CPAP or BPAP. For home use, CPAP and BPAP machines can be rented or purchased through home health care companies. Many different brands of machines are available. Renting a machine before purchasing may help you find out which particular machine works well for you. Your health insurance company may also decide which machine you may get. Keep the CPAP or BPAP machine and attachments clean. Ask your health care provider for specific instructions. Check the humidifier if you have a dry stuffy nose or nosebleeds. Make sure it is working correctly. Follow these instructions at home: Take over-the-counter and prescription medicines only as told by your health care provider. Ask if you can take sinus medicine if your sinuses are blocked. Do not use any products that contain nicotine or tobacco. These products include cigarettes, chewing tobacco, and vaping devices, such as e-cigarettes. If you need help quitting, ask your health care provider. Keep all follow-up visits. This is important. Contact a health care provider if: You have redness or pressure sores on your head, face, mouth, or nose from the mask or head gear. You have trouble using the CPAP or BPAP  machine. You cannot tolerate wearing the CPAP or BPAP mask. Someone tells you that you snore even when wearing your CPAP or BPAP. Get help right away if: You have trouble breathing. You feel confused. Summary CPAP and BPAP are methods that use air pressure to keep your airways open and to help you breathe well. If you have trouble with the mask, it is very important that you talk with your health care provider about finding a way to make the mask easier to tolerate. Do not stop using the mask. There could be a negative impact to your health if you stop using the mask. Follow instructions from your health care provider about when to use the machine. This information is not intended to replace advice given to you by your health care provider. Make sure you discuss any questions you have with your healthcare provider. Document Revised: 04/26/2020 Document Reviewed: 04/26/2020 Elsevier Patient Education  2022 [employer].

## 2021-01-15 NOTE — Progress Notes (Signed)
_0  ID: Dana Obrien, female    DOB: Apr 24, 1946, 75 y.o.   MRN: [id number]  No chief complaint on file.   Referring provider: Leone Haven, MD  HPI: 75 year old female, never smoked.  Past medical history significant for OSA, chronic sinusitis, hypertension, GERD.  01/15/2021 Patient presents today for sleep consult.  Patient had a split-night sleep study on 12/04/2020 that showed severe obstructive sleep apnea, AHI 44/hr with SPO2 low 83%.  He was recommended that she start BiPAP therapy with pressure setting of 22/19 centimeters H2O, heated humidity and ResMed F 30 I fullface mask size small.  She had a [employer]'s CPAP machine that was recalled.  She was using [employer] device to clean this. Reports seeing black specks and stopped using device. She goes to bed at 8-9pm, she takes trazodone before reading in bed. She wakes up several times at night to use restroom. She reports increased fatigue and memory trouble since she stopped using CPAP device. Previous DME company was [facility]. Epworth 5.    Testing: 12/04/20 Split night sleep study>> Severe OSA, AHI 44/hr with SpO2 low 83%. CPAP unable to eliminate events, requriing BIPAP pressure setting 22/19cm h20.   Allergies  Allergen Reactions   Lidocaine Palpitations   Codeine Nausea Only and Nausea And Vomiting   Morphine Nausea Only   Niacin Rash   Niacin And Related Rash    ANTIHYPERLIPEMICS    Immunization History  Administered Date(s) Administered   Fluad Quad(high Dose 65+) 02/23/2020   Influenza Whole 06/04/2008   Influenza, High Dose Seasonal PF 08/11/2017, 05/30/2018   Influenza,inj,Quad PF,6+ Mos 03/06/2014   Influenza-Unspecified 03/21/2012, 03/01/2013, 04/30/2015   PFIZER(Purple Top)SARS-COV-2 Vaccination 06/08/2019, 07/01/2019   Pneumococcal Conjugate-13 10/25/2013   Pneumococcal Polysaccharide-23 10/19/2012   Td 06/01/2005    Past Medical History:  Diagnosis Date   Anxiety    Arthritis     Asthma    Breast cancer ([place]) 2004   right breast   Breast cancer ([place]) 09/08/2017   left breast   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   Cancer [facility]) 2004   right lumpectomy, chemotherapy and radiation Dr. Jeb Levering   CPAP (continuous positive airway pressure) dependence    Depression    GERD (gastroesophageal reflux disease)    Hypertension    Migraine    Personal history of chemotherapy 2004   Personal history of radiation therapy 2004   Personal history of radiation therapy 2019   PONV (postoperative nausea and vomiting)    Shingles    Sleep apnea    uses CPAP, severe OSA   Vitamin D deficiency    IN THE PAST    Tobacco History: Social History   Tobacco Use  Smoking Status Never  Smokeless Tobacco Never   Counseling given: Not Answered   Outpatient Medications Prior to Visit  Medication Sig Dispense Refill   albuterol (VENTOLIN HFA) 108 (90 Base) MCG/ACT inhaler Inhale 2 puffs into the lungs every 6 (six) hours as needed for wheezing or shortness of breath. 8 g 0   ALPRAZolam (XANAX) 0.25 MG tablet Take 1 tablet by mouth twice daily as needed for anxiety 20 tablet 0   celecoxib (CELEBREX) 100 MG capsule TAKE 1 CAPSULE BY MOUTH TWICE DAILY AS NEEDED FOR MODERATE PAIN 60 capsule 0   diclofenac Sodium (VOLTAREN) 1 % GEL Apply 2 g topically 4 (four) times daily as needed.  EQ ALLERGY RELIEF, CETIRIZINE, 10 MG tablet Take 1 tablet by mouth once daily 90 tablet 0   FLUoxetine 60 MG TABS Take 60 mg by mouth daily. 90 tablet 1   fluticasone (FLONASE) 50 MCG/ACT nasal spray Place 1 spray into both nostrils daily. 16 g 5   hydrochlorothiazide (HYDRODIURIL) 25 MG tablet Take 1 tablet by mouth once daily 90 tablet 0   hydrOXYzine (ATARAX/VISTARIL) 10 MG tablet Take 1 tablet (10 mg total) by mouth 3 (three) times daily as needed for itching. 90 tablet 1   Multiple Vitamin  (MULTI-VITAMIN) tablet Take 1 tablet by mouth daily.      Omega-3 Fatty Acids (FISH OIL PO) Take by mouth daily.     ondansetron (ZOFRAN ODT) 4 MG disintegrating tablet Allow 1-2 tablets to dissolve in your mouth every 8 hours as needed for nausea/vomiting 30 tablet 0   pantoprazole (PROTONIX) 40 MG tablet Take 1 tablet by mouth once daily 30 tablet 0   rosuvastatin (CRESTOR) 40 MG tablet Take 1 tablet (40 mg total) by mouth daily. 90 tablet 1   senna (SENOKOT) 8.6 MG TABS tablet Take 1 tablet by mouth at bedtime.      SUMAtriptan (IMITREX) 50 MG tablet Take 1 tablet (50 mg total) by mouth every 2 (two) hours as needed for up to 2 doses for migraine. May repeat in 2 hours if headache persists or recurs. Max dose in 24 hours is two tablets. 10 tablet 0   traMADol (ULTRAM) 50 MG tablet TAKE 1 TABLET BY MOUTH EVERY 6 HOURS AS NEEDED 30 tablet 0   traZODone (DESYREL) 50 MG tablet TAKE 1/2 TO 1 (ONE-HALF TO ONE) TABLET BY MOUTH AT BEDTIME AS NEEDED FOR SLEEP 30 tablet 0   tretinoin (RETIN-A) 0.05 % cream Apply 1 application topically at bedtime. 45 g 0   trolamine salicylate (ASPERCREME) 10 % cream Apply 1 application topically as needed for muscle pain.     No facility-administered medications prior to visit.      Review of Systems  Review of Systems  Constitutional:  Positive for fatigue.  HENT: Negative.    Respiratory: Negative.    Psychiatric/Behavioral:  Positive for sleep disturbance.     Physical Exam  BP 130/70 (BP Location: Left Arm, Patient Position: Sitting, Cuff Size: Normal)   Pulse 74   Temp 98.1 F (36.7 C) (Oral)   Ht _0  (1.626 m)   Wt 187 lb 9.6 oz (85.1 kg)   SpO2 95%   BMI 32.20 kg/m  Physical Exam Constitutional:      Appearance: Normal appearance.  HENT:     Head: Normocephalic and atraumatic.     Mouth/Throat:     Comments: Deferred d/t masking Cardiovascular:     Rate and Rhythm: Normal rate and regular rhythm.  Pulmonary:     Effort: Pulmonary  effort is normal.     Breath sounds: Normal breath sounds.  Musculoskeletal:        General: Normal range of motion.  Skin:    General: Skin is warm and dry.  Neurological:     General: No focal deficit present.     Mental Status: She is alert and oriented to person, place, and time. Mental status is at baseline.  Psychiatric:        Mood and Affect: Mood normal.        Behavior: Behavior normal.        Thought Content: Thought content normal.  Judgment: Judgment normal.     Lab Results:  CBC    Component Value Date/Time   WBC 7.3 02/23/2020 1011   RBC 4.27 02/23/2020 1011   HGB 13.3 02/23/2020 1011   HGB 13.2 11/20/2013 1534   HCT 39.2 02/23/2020 1011   HCT 39.7 11/20/2013 1534   PLT 188.0 02/23/2020 1011   PLT 222 11/20/2013 1534   MCV 91.7 02/23/2020 1011   MCV 97 11/20/2013 1534   MCH 31.3 04/10/2019 0240   MCHC 33.9 02/23/2020 1011   RDW 14.9 02/23/2020 1011   RDW 13.7 11/20/2013 1534   LYMPHSABS 1.5 04/10/2019 0240   LYMPHSABS 2.1 11/20/2013 1534   MONOABS 0.6 04/10/2019 0240   MONOABS 0.7 11/20/2013 1534   EOSABS 0.0 04/10/2019 0240   EOSABS 0.3 11/20/2013 1534   BASOSABS 0.0 04/10/2019 0240   BASOSABS 0.1 11/20/2013 1534    BMET    Component Value Date/Time   NA 142 02/23/2020 1011   NA 139 11/20/2013 1534   K 3.4 (L) 02/23/2020 1011   K 3.9 11/20/2013 1534   CL 100 02/23/2020 1011   CL 105 11/20/2013 1534   CO2 32 02/23/2020 1011   CO2 32 11/20/2013 1534   GLUCOSE 98 02/23/2020 1011   GLUCOSE 104 (H) 11/20/2013 1534   BUN 19 02/23/2020 1011   BUN 20 (H) 11/20/2013 1534   CREATININE 0.74 02/23/2020 1011   CREATININE 1.12 (H) 11/01/2015 1622   CALCIUM 10.1 02/23/2020 1011   CALCIUM 9.2 11/20/2013 1534   GFRNONAA >60 04/10/2019 0240   GFRNONAA >60 11/20/2013 1534   GFRAA >60 04/10/2019 0240   GFRAA >60 11/20/2013 1534    BNP No results found for: BNP  ProBNP No results found for: PROBNP  Imaging: SLEEP STUDY DOCUMENTS  Result  Date: 12/23/2020 Ordered by an unspecified provider.    Assessment & Plan:   OSA (obstructive sleep apnea) Patient has hx sleep apnea, stopped wearing CPAP d/t [facility] recall. She has symptoms of loud snoring, witness apnea and daytime sleepiness. Epworth score 5. She had Split night sleep study in July 2022 with her PCP's office that showed severe OSA, AHI 44/hr with SpO2 low 83%. CPAP was unable to eliminate events, requriing BIPAP pressure setting 22/19cm h20. We have placed an order for DME to provide patient with BIPAP machine at above setting. She will call our office to schedule follow-up 31-90 days after receiving device.    Recommendations: - Aim to wear BIPAP every night for 6 hours or more - Do not drive if excessively tired   Orders: - BiPAP therapy with pressure setting of 22/19 centimeters H2O, heated humidity and ResMed F 30 I fullface mask size small.   Follow-up: - Once you get new BIPAP machine please call our office to scheduled follow-up 31-90 days starting   Martyn Ehrich, NP 01/15/2021

## 2021-01-16 ENCOUNTER — Telehealth: Payer: Self-pay | Admitting: Primary Care

## 2021-01-16 DIAGNOSIS — G4733 Obstructive sleep apnea (adult) (pediatric): Secondary | ICD-10-CM

## 2021-01-16 NOTE — Telephone Encounter (Signed)
Kim from [facility] is wanting to know if NP Volanda Napoleon will send the order for the pt to have a BIPAP machine stated they have been unable to get Dr. Caryl Bis to sign order. Pls regard; [phone]

## 2021-01-16 NOTE — Progress Notes (Signed)
Reviewed and agree with assessment/plan.   Chesley Mires, MD [facility] Pulmonary/Critical Care 01/16/2021, 9:07 AM Pager:  [phone]

## 2021-01-24 ENCOUNTER — Inpatient Hospital Stay: Payer: PPO | Admitting: Oncology

## 2021-01-24 ENCOUNTER — Ambulatory Visit: Payer: PPO | Admitting: Oncology

## 2021-01-24 NOTE — Telephone Encounter (Signed)
Please we can send in order. Split-night sleep study on 12/04/2020 that showed severe obstructive sleep apnea  Orders: - BiPAP therapy with pressure setting of 22/19 centimeters H2O, heated humidity and ResMed F 30 I fullface mask size small.

## 2021-01-24 NOTE — Telephone Encounter (Signed)
Order has been placed for pt's BIPAP start.  Called and spoke with Maudie Mercury letting her know this info and she verbalized understanding. Nothing further needed.

## 2021-02-11 ENCOUNTER — Telehealth: Payer: Self-pay | Admitting: Family Medicine

## 2021-02-11 NOTE — Telephone Encounter (Signed)
Patient states she needs her BiPAP machine order resent to [facility] as they are in network. States the original order was sent to a company that she found out was out of network.   Their fax number is [phone].

## 2021-02-12 ENCOUNTER — Ambulatory Visit: Payer: PPO | Admitting: Family Medicine

## 2021-02-12 NOTE — Telephone Encounter (Signed)
Do you still have the order for her BiPAP machine?  If you do can you send it to [facility]?

## 2021-02-14 ENCOUNTER — Telehealth: Payer: Self-pay | Admitting: Primary Care

## 2021-02-14 NOTE — Telephone Encounter (Signed)
Lm for patient.  

## 2021-02-17 ENCOUNTER — Telehealth: Payer: Self-pay | Admitting: Primary Care

## 2021-02-17 DIAGNOSIS — G4733 Obstructive sleep apnea (adult) (pediatric): Secondary | ICD-10-CM

## 2021-02-17 NOTE — Telephone Encounter (Signed)
Lm x2 for patient.  Will close encounter per office protocol.   

## 2021-02-17 NOTE — Telephone Encounter (Signed)
Gwenyth Bouillon P "Sonia Baller" to Thana Farr M     10:39 AM Pt needs Bipap order sent to [facility]. [facility] doesn't accept her insurance.     New order has been placed having [facility] as DME. Attempted to call pt to let her know this had been done but line went straight to VM. Left pt a detailed message letting her know this had been done. Nothing further needed.

## 2021-02-17 NOTE — Telephone Encounter (Signed)
Faxed order and information to [facility].  Margaretta Chittum,cma

## 2021-02-28 ENCOUNTER — Other Ambulatory Visit: Payer: Self-pay | Admitting: Family Medicine

## 2021-03-17 ENCOUNTER — Other Ambulatory Visit: Payer: Self-pay | Admitting: Family Medicine

## 2021-03-18 ENCOUNTER — Other Ambulatory Visit: Payer: Self-pay | Admitting: Family Medicine

## 2021-03-19 NOTE — Telephone Encounter (Signed)
Patient is needing an appointment. Unable to reach patient. Phone kept ringing. Request for cholesterol medication refill was sent automatically. Please advise if ok to refill before appointment can be scheduled.

## 2021-03-20 ENCOUNTER — Encounter: Payer: Self-pay | Admitting: Family Medicine

## 2021-03-20 ENCOUNTER — Telehealth: Payer: Self-pay | Admitting: Family Medicine

## 2021-03-20 NOTE — Telephone Encounter (Signed)
Patient wanted Dr Caryl Bis to know that she did not get her bi-Pap, and she had been waiting since July.

## 2021-03-20 NOTE — Telephone Encounter (Signed)
It is okay to send a 90-day supply in.  She does need to be scheduled for an appointment.

## 2021-03-21 ENCOUNTER — Other Ambulatory Visit: Payer: Self-pay

## 2021-03-21 ENCOUNTER — Other Ambulatory Visit: Payer: Self-pay | Admitting: Family Medicine

## 2021-03-21 ENCOUNTER — Encounter: Payer: Self-pay | Admitting: Surgical

## 2021-03-21 ENCOUNTER — Ambulatory Visit (INDEPENDENT_AMBULATORY_CARE_PROVIDER_SITE_OTHER): Payer: PPO | Admitting: Surgical

## 2021-03-21 DIAGNOSIS — M25561 Pain in right knee: Secondary | ICD-10-CM

## 2021-03-21 DIAGNOSIS — M1711 Unilateral primary osteoarthritis, right knee: Secondary | ICD-10-CM

## 2021-03-21 MED ORDER — BUPIVACAINE HCL 0.25 % IJ SOLN
4.0000 mL | INTRAMUSCULAR | Status: AC | PRN
  Administered 2021-03-21: 4 mL via INTRA_ARTICULAR

## 2021-03-21 MED ORDER — LIDOCAINE HCL 1 % IJ SOLN
5.0000 mL | INTRAMUSCULAR | Status: AC | PRN
  Administered 2021-03-21: 5 mL

## 2021-03-21 MED ORDER — METHYLPREDNISOLONE ACETATE 40 MG/ML IJ SUSP
40.0000 mg | INTRAMUSCULAR | Status: AC | PRN
  Administered 2021-03-21: 40 mg via INTRA_ARTICULAR

## 2021-03-21 NOTE — Telephone Encounter (Signed)
I called and spoke with the patient and informed her that pulmonary is the one who ordered her BIPAP and she needed to call them bout it and she stated she let them know as well.  Patient also wanted to know if her refills were done, I informed her that I only see 2 that were ordered and she was not home and started she would call back with the name of the other medication.  Holland Kotter,cma

## 2021-03-21 NOTE — Telephone Encounter (Signed)
It looks like pulmonology with the ordering provider for her BiPAP.  She needs to contact them regarding this so they can check into what the issue is.

## 2021-03-21 NOTE — Progress Notes (Signed)
Office Visit Note   Patient: Dana Obrien           Date of Birth: 1945-06-22           MRN: [id number] Visit Date: 03/21/2021 Requested by: Leone Haven, MD [address] [place],  [place] [address] PCP: Leone Haven, MD  Subjective: Chief Complaint  Patient presents with   Right Knee - Follow-up    HPI: Dana Obrien is a 75 y.o. female who presents to the office complaining of right knee pain.  Has history of right knee osteoarthritis.  Last injection 11/18/2020.  This provided relief of pain until about 3 weeks ago.  Denies any history of diabetes, groin pain.  No new injury.  She takes occasional tramadol as well as Celebrex for pain.  She is ambulate with a cane ever since her pain returned.  She would like to try repeat cortisone injection today.  No history of gel injections..                ROS: All systems reviewed are negative as they relate to the chief complaint within the history of present illness.  Patient denies fevers or chills.  Assessment & Plan: Visit Diagnoses:  1. Unilateral primary osteoarthritis, right knee     Plan: Patient is a 75 year old female who presents for evaluation of right knee pain.  Has history of right knee osteoarthritis that is responded well to cortisone injections.  Last injection in mid June provided almost 3 to 3.5 months of relief.  She would like to try repeat cortisone injection today.  Tolerated the injection well.  Plan to preapproved her for gel injections for when this wears off to see if gel injections will help more than cortisone injections.  She is not considering surgery while her injections are giving her fairly long-lasting relief for now.  This patient is diagnosed with osteoarthritis of the knee(s).    Radiographs show evidence of joint space narrowing, osteophytes, subchondral sclerosis and/or subchondral cysts.  This patient has knee pain which interferes with functional and activities of  daily living.    This patient has experienced inadequate response, adverse effects and/or intolerance with conservative treatments such as acetaminophen, NSAIDS, topical creams, physical therapy or regular exercise, knee bracing and/or weight loss.   This patient has experienced inadequate response or has a contraindication to intra articular steroid injections for at least 3 months.   This patient is not scheduled to have a total knee replacement within 6 months of starting treatment with viscosupplementation.   Follow-Up Instructions: No follow-ups on file.   Orders:  No orders of the defined types were placed in this encounter.  No orders of the defined types were placed in this encounter.     Procedures: Large Joint Inj: R knee on 03/21/2021 7:58 PM Indications: diagnostic evaluation, joint swelling and pain Details: 18 G 1.5 in needle, superolateral approach  Arthrogram: No  Medications: 5 mL lidocaine 1 %; 40 mg methylPREDNISolone acetate 40 MG/ML; 4 mL bupivacaine 0.25 % Outcome: tolerated well, no immediate complications Procedure, treatment alternatives, risks and benefits explained, specific risks discussed. Consent was given by the patient. Immediately prior to procedure a time out was called to verify the correct patient, procedure, equipment, support staff and site/side marked as required. Patient was prepped and draped in the usual sterile fashion.      Clinical Data: No additional findings.  Objective: Vital Signs: There were no vitals taken for  this visit.  Physical Exam:  Constitutional: Patient appears well-developed HEENT:  Head: Normocephalic Eyes:EOM are normal Neck: Normal range of motion Cardiovascular: Normal rate Pulmonary/chest: Effort normal Neurologic: Patient is alert Skin: Skin is warm Psychiatric: Patient has normal mood and affect  Ortho Exam: Ortho exam demonstrates right knee with no effusion.  Tenderness over the medial and lateral  joint lines.  Able to perform straight leg raise.  No pain with hip range of motion.  Well-preserved knee range of motion.  Good straight leg raise.  Specialty Comments:  No specialty comments available.  Imaging: No results found.   PMFS History: Patient Active Problem List   Diagnosis Date Noted   Right knee pain 11/08/2020   Concussion with no loss of consciousness 08/08/2020   Vitamin D deficiency 08/08/2020   Gastroesophageal reflux disease without esophagitis 03/25/2020   Dermatitis due to plants, including poison ivy, sumac, and oak 02/29/2020   Neuropathy 12/15/2019   Allergic rhinitis 07/05/2019   Brain atrophy ([place]) 05/18/2019   Bilateral hearing loss 08/15/2018   Decreased energy 08/15/2018   Prediabetes 06/02/2018   Arthralgia 06/02/2018   BPPV (benign paroxysmal positional vertigo), right 03/11/2018   Dysfunction of right eustachian tube 03/11/2018   Malignant neoplasm of upper-outer quadrant of left breast in female, estrogen receptor positive ([place]) 09/14/2017   Radiculopathy 08/24/2017   Fatty liver 08/24/2017   Abdominal pain 08/24/2017   Hearing difficulty of both ears 06/25/2017   Chronic sinusitis 11/05/2016   Bilateral sensorineural hearing loss 10/13/2016   Right ear impacted cerumen 10/13/2016   GERD (gastroesophageal reflux disease) 09/07/2016   Exertional shortness of breath 01/15/2016   Uterine prolapse 11/14/2015   OSA (obstructive sleep apnea) 11/01/2015   Back muscle spasm 04/30/2015   DDD (degenerative disc disease), lumbar 04/30/2015   Lumbar radiculitis 04/30/2015   Low back pain 01/01/2015   Varicose vein of leg 11/22/2014   Post herpetic neuralgia 03/06/2014   Increased frequency of urination 11/17/2013   Rectocele 11/17/2013   Cystocele 10/25/2013   Skin lesion 10/25/2013   Disorder of skin and subcutaneous tissue 10/25/2013   Midline cystocele 10/25/2013   Pelvic and perineal pain 10/25/2013   Obesity, unspecified 10/19/2012    History of breast cancer 02/11/2012   Hyperlipidemia 05/11/2011   Osteopenia 05/11/2011   Disorder of bone and cartilage 05/11/2011   Anxiety and depression 03/04/2011   Hypertension 03/04/2011   Past Medical History:  Diagnosis Date   Anxiety    Arthritis    Asthma    Breast cancer ([place]) 2004   right breast   Breast cancer ([place]) 09/08/2017   left breast   Breast cancer of upper-outer quadrant of left female breast ([place]) 09/17/2017   T1c, N0, ER 90%, PR 90%, 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   Cancer [facility]) 2004   right lumpectomy, chemotherapy and radiation Dr. Jeb Levering   CPAP (continuous positive airway pressure) dependence    Depression    GERD (gastroesophageal reflux disease)    Hypertension    Migraine    Personal history of chemotherapy 2004   Personal history of radiation therapy 2004   Personal history of radiation therapy 2019   PONV (postoperative nausea and vomiting)    Shingles    Sleep apnea    uses CPAP, severe OSA   Vitamin D deficiency    IN THE PAST    Family History  Problem Relation Age of Onset   Heart disease Mother    Hyperlipidemia Mother    Hypertension Mother  Hypertension Father    Osteoarthritis Father    Stroke Father    Alzheimer's disease Sister    Alzheimer's disease Brother    Brain cancer Brother    Healthy Daughter    Healthy Son    Breast cancer Neg Hx     Past Surgical History:  Procedure Laterality Date   ABDOMINAL HYSTERECTOMY     ANTERIOR AND POSTERIOR REPAIR     BREAST BIOPSY Right 2004   stereo. 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  3.5 cm maximum diameter, minimal margins 5 mm.  In situ component less than 10%.   BREAST LUMPECTOMY Right 2004   BREAST LUMPECTOMY Left 09/17/2017   Procedure: BREAST EXCISION, SENTINEL NODE BIOPSY;   Surgeon: Robert Bellow, MD;  Location: [facility];  Service: General;  Laterality: Left;   COLONOSCOPY  2015   COLONOSCOPY WITH PROPOFOL N/A 10/04/2019   Procedure: COLONOSCOPY WITH PROPOFOL;  Surgeon: Robert Bellow, MD;  Location: [facility];  Service: Endoscopy;  Laterality: N/A;   DILATION AND CURETTAGE OF UTERUS     ETHMOIDECTOMY Bilateral 11/05/2016   Procedure: ETHMOIDECTOMY;  Surgeon: Melida Quitter, MD;  Location: [place];  Service: ENT;  Laterality: Bilateral;   LAPAROTOMY     MAXILLARY ANTROSTOMY Bilateral 11/05/2016   Procedure: MAXILLARY ANTROSTOMY;  Surgeon: Melida Quitter, MD;  Location: [place];  Service: ENT;  Laterality: Bilateral;   SINUS ENDO W/FUSION Bilateral 11/05/2016   Procedure: FRONTAL RECESS EXPLORATION;  Surgeon: Melida Quitter, MD;  Location: [place];  Service: ENT;  Laterality: Bilateral;  BILATERAL ENDOSCOPIC SINUS SURGERY WITH FUSION   SINUS EXPLORATION  11/05/2016   SPHENOIDECTOMY Bilateral 11/05/2016   Procedure: SPHENOIDECTOMY;  Surgeon: Melida Quitter, MD;  Location: [facility] OR;  Service: ENT;  Laterality: Bilateral;   Social History   Occupational History   Not on file  Tobacco Use   Smoking status: Never   Smokeless tobacco: Never  Vaping Use   Vaping Use: Never used  Substance and Sexual Activity   Alcohol use: Yes    Comment: rarely   Drug use: No   Sexual activity: Not Currently

## 2021-03-24 NOTE — Telephone Encounter (Signed)
Please find out if this medication has been beneficial for her arthritic pain. If it has not bee beneficial then there is no need to refill it. Thanks.

## 2021-03-25 ENCOUNTER — Inpatient Hospital Stay: Payer: PPO | Attending: Oncology | Admitting: Oncology

## 2021-03-25 ENCOUNTER — Other Ambulatory Visit: Payer: Self-pay

## 2021-03-25 ENCOUNTER — Encounter: Payer: Self-pay | Admitting: Oncology

## 2021-03-25 VITALS — BP 127/74 | HR 74 | Temp 98.2°F | Resp 18 | Wt 187.8 lb

## 2021-03-25 DIAGNOSIS — Z9221 Personal history of antineoplastic chemotherapy: Secondary | ICD-10-CM | POA: Diagnosis not present

## 2021-03-25 DIAGNOSIS — Z923 Personal history of irradiation: Secondary | ICD-10-CM | POA: Insufficient documentation

## 2021-03-25 DIAGNOSIS — Z823 Family history of stroke: Secondary | ICD-10-CM | POA: Insufficient documentation

## 2021-03-25 DIAGNOSIS — M25562 Pain in left knee: Secondary | ICD-10-CM | POA: Insufficient documentation

## 2021-03-25 DIAGNOSIS — Z170 Estrogen receptor positive status [ER+]: Secondary | ICD-10-CM | POA: Insufficient documentation

## 2021-03-25 DIAGNOSIS — Z79899 Other long term (current) drug therapy: Secondary | ICD-10-CM | POA: Diagnosis not present

## 2021-03-25 DIAGNOSIS — I10 Essential (primary) hypertension: Secondary | ICD-10-CM | POA: Diagnosis not present

## 2021-03-25 DIAGNOSIS — Z8349 Family history of other endocrine, nutritional and metabolic diseases: Secondary | ICD-10-CM | POA: Diagnosis not present

## 2021-03-25 DIAGNOSIS — Z853 Personal history of malignant neoplasm of breast: Secondary | ICD-10-CM

## 2021-03-25 DIAGNOSIS — M25561 Pain in right knee: Secondary | ICD-10-CM | POA: Insufficient documentation

## 2021-03-25 DIAGNOSIS — Z818 Family history of other mental and behavioral disorders: Secondary | ICD-10-CM | POA: Insufficient documentation

## 2021-03-25 DIAGNOSIS — F32A Depression, unspecified: Secondary | ICD-10-CM | POA: Diagnosis not present

## 2021-03-25 DIAGNOSIS — F419 Anxiety disorder, unspecified: Secondary | ICD-10-CM | POA: Diagnosis not present

## 2021-03-25 DIAGNOSIS — M2550 Pain in unspecified joint: Secondary | ICD-10-CM | POA: Diagnosis not present

## 2021-03-25 DIAGNOSIS — Z8249 Family history of ischemic heart disease and other diseases of the circulatory system: Secondary | ICD-10-CM | POA: Insufficient documentation

## 2021-03-25 DIAGNOSIS — Z885 Allergy status to narcotic agent status: Secondary | ICD-10-CM | POA: Insufficient documentation

## 2021-03-25 DIAGNOSIS — G4733 Obstructive sleep apnea (adult) (pediatric): Secondary | ICD-10-CM | POA: Diagnosis not present

## 2021-03-25 DIAGNOSIS — C50412 Malignant neoplasm of upper-outer quadrant of left female breast: Secondary | ICD-10-CM | POA: Insufficient documentation

## 2021-03-25 DIAGNOSIS — Z8261 Family history of arthritis: Secondary | ICD-10-CM | POA: Insufficient documentation

## 2021-03-25 DIAGNOSIS — Z08 Encounter for follow-up examination after completed treatment for malignant neoplasm: Secondary | ICD-10-CM | POA: Diagnosis not present

## 2021-03-25 DIAGNOSIS — Z808 Family history of malignant neoplasm of other organs or systems: Secondary | ICD-10-CM | POA: Diagnosis not present

## 2021-03-25 NOTE — Progress Notes (Signed)
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   Chief complaint/ Reason for visit-routine follow-up of breast cancer  Heme/Onc history: patient is a 75 year old female with a prior history of right breast cancer in 2004 s/p lumpectomy followed by [facility] X4 and XRT. T2N0M0.  She thenn completed 5 years of aromasin until 2009. She could not tolerate arimidex.   Recent screening mammogram on 08/25/2017 showed a possible mass in her left breast.  This was followed by an ultrasound and a diagnostic left mammogram which showed a 0.7 x 0.5 x 0.7 cm mass in the left breast.  No evidence of left axillary adenopathy.   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  ER greater than 90% positive, PR greater than 90% positive and HER-2/neu negative    Final pathology showed: Invasive mammary carcinoma 13 mm, grade 1 with associated DCIS.  Negative margins.  0 out of 3 lymph nodes were positive for malignancy. ER PR positive and HER-2/neu negative pT1c pN0    Oncotype testing showed a recurrence score of 20 and given that the patient is more than 75 years of age she did not require adjuvant chemotherapy   Genetic testing revealed variance of uncertain significance identified in [facility]   Patient could not undergo MammoSite balloon placement and is therefore receiving standard radiation treatment for 5 weeks.  Aromasin started in August 2019.  She had problems tolerating Aromasin and was switched to letrozole.   Patient stopped all hormone therapy in February 2022 due to poor tolerance    Interval history-currently patient continues to have bilateral knee pain and is undergoing steroid injections but overall feels better after she has stopped taking hormone therapy.  ECOG PS- 1 Pain scale- 0   Review of systems- Review of Systems  Constitutional:  Negative for chills, fever, malaise/fatigue and weight loss.  HENT:  Negative for congestion, ear discharge and nosebleeds.   Eyes:  Negative for blurred vision.  Respiratory:  Negative for cough, hemoptysis, sputum production, shortness of breath and wheezing.   Cardiovascular:  Negative for chest pain, palpitations, orthopnea and claudication.  Gastrointestinal:  Negative for abdominal pain, blood in stool, constipation, diarrhea, heartburn, melena, nausea and vomiting.  Genitourinary:  Negative for dysuria, flank pain, frequency, hematuria and urgency.  Musculoskeletal:  Positive for joint pain. Negative for back pain and myalgias.  Skin:  Negative for rash.  Neurological:  Negative for dizziness, tingling, focal weakness, seizures, weakness and headaches.  Endo/Heme/Allergies:  Does not bruise/bleed easily.  Psychiatric/Behavioral:  Negative for depression and suicidal ideas. The patient does not have insomnia.      Allergies  Allergen Reactions   Lidocaine Palpitations   Codeine Nausea Only and Nausea And Vomiting   Morphine Nausea Only   Niacin Rash   Niacin And Related Rash    ANTIHYPERLIPEMICS     Past Medical History:  Diagnosis Date   Anxiety    Arthritis    Asthma    Breast cancer ([place]) 2004   right breast   Breast cancer ([place]) 09/08/2017  left breast   Breast cancer of upper-outer quadrant of left female breast ([place]) 09/17/2017   T1c, N0, ER 90%, PR 90%, 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   Cancer [facility]) 2004   right lumpectomy, chemotherapy and radiation Dr. Jeb Levering   CPAP (continuous positive airway pressure) dependence     Depression    GERD (gastroesophageal reflux disease)    Hypertension    Migraine    Personal history of chemotherapy 2004   Personal history of radiation therapy 2004   Personal history of radiation therapy 2019   PONV (postoperative nausea and vomiting)    Shingles    Sleep apnea    uses CPAP, severe OSA   Vitamin D deficiency    IN THE PAST     Past Surgical History:  Procedure Laterality Date   ABDOMINAL HYSTERECTOMY     ANTERIOR AND POSTERIOR REPAIR     BREAST BIOPSY Right 2004   stereo. 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  3.5 cm maximum diameter, minimal margins 5 mm.  In situ component less than 10%.   BREAST LUMPECTOMY Right 2004   BREAST LUMPECTOMY Left 09/17/2017   Procedure: BREAST EXCISION, SENTINEL NODE BIOPSY;  Surgeon: Robert Bellow, MD;  Location: [facility];  Service: General;  Laterality: Left;   COLONOSCOPY  2015   COLONOSCOPY WITH PROPOFOL N/A 10/04/2019   Procedure: COLONOSCOPY WITH PROPOFOL;  Surgeon: Robert Bellow, MD;  Location: [facility];  Service: Endoscopy;  Laterality: N/A;   DILATION AND CURETTAGE OF UTERUS     ETHMOIDECTOMY Bilateral 11/05/2016   Procedure: ETHMOIDECTOMY;  Surgeon: Melida Quitter, MD;  Location: [place];  Service: ENT;  Laterality: Bilateral;   LAPAROTOMY     MAXILLARY ANTROSTOMY Bilateral 11/05/2016   Procedure: MAXILLARY ANTROSTOMY;  Surgeon: Melida Quitter, MD;  Location: [place];  Service: ENT;  Laterality: Bilateral;   SINUS ENDO W/FUSION Bilateral 11/05/2016   Procedure: FRONTAL RECESS EXPLORATION;  Surgeon: Melida Quitter, MD;  Location: [place];  Service: ENT;  Laterality: Bilateral;  BILATERAL ENDOSCOPIC SINUS SURGERY WITH FUSION   SINUS EXPLORATION  11/05/2016   SPHENOIDECTOMY Bilateral 11/05/2016   Procedure: SPHENOIDECTOMY;  Surgeon:  Melida Quitter, MD;  Location: [place];  Service: ENT;  Laterality: Bilateral;    Social History   Socioeconomic History   Marital status: Widowed    Spouse name: Not on file   Number of children: Not on file   Years of education: Not on file   Highest education level: Not on file  Occupational History   Not on file  Tobacco Use   Smoking status: Never   Smokeless tobacco: Never  Vaping Use   Vaping Use: Never used  Substance and Sexual Activity   Alcohol use: Yes    Comment: rarely   Drug use: No   Sexual activity: Not Currently  Other Topics Concern   Not on file  Social History Narrative   Daily Caffeine Use:  1-2 coffee   Regular Exercise -  NO   Social Determinants of Health   Financial Resource Strain: Low Risk    Difficulty of Paying Living Expenses: Not hard at all  Food Insecurity: No Food Insecurity   Worried About Charity fundraiser in the Last Year: Never true   [place] in the Last Year: Never true  Transportation Needs:  No Transportation Needs   Lack of Transportation (Medical): No   Lack of Transportation (Non-Medical): No  Physical Activity: Unknown   Days of Exercise per Week: 0 days   Minutes of Exercise per Session: Not on file  Stress: No Stress Concern Present   Feeling of Stress : Not at all  Social Connections: Unknown   Frequency of Communication with Friends and Family: More than three times a week   Frequency of Social Gatherings with Friends and Family: More than three times a week   Attends Religious Services: More than 4 times per year   Active Member of [employer] or Organizations: Not on file   Attends Archivist Meetings: Not on file   Marital Status: Widowed  Human resources officer Violence: Not At Risk   Fear of Current or Ex-Partner: No   Emotionally Abused: No   Physically Abused: No   Sexually Abused: No    Family History  Problem Relation Age of Onset   Heart disease Mother    Hyperlipidemia Mother    Hypertension  Mother    Hypertension Father    Osteoarthritis Father    Stroke Father    Alzheimer's disease Sister    Alzheimer's disease Brother    Brain cancer Brother    Healthy Daughter    Healthy Son    Breast cancer Neg Hx      Current Outpatient Medications:    albuterol (VENTOLIN HFA) 108 (90 Base) MCG/ACT inhaler, Inhale 2 puffs into the lungs every 6 (six) hours as needed for wheezing or shortness of breath., Disp: 8 g, Rfl: 0   ALPRAZolam (XANAX) 0.25 MG tablet, Take 1 tablet by mouth twice daily as needed for anxiety, Disp: 20 tablet, Rfl: 0   diclofenac Sodium (VOLTAREN) 1 % GEL, Apply 2 g topically 4 (four) times daily as needed., Disp: , Rfl:    EQ ALLERGY RELIEF, CETIRIZINE, 10 MG tablet, Take 1 tablet by mouth once daily, Disp: 90 tablet, Rfl: 0   FLUoxetine HCl 60 MG TABS, Take 1 tablet by mouth once daily, Disp: 90 tablet, Rfl: 0   fluticasone (FLONASE) 50 MCG/ACT nasal spray, Place 1 spray into both nostrils daily., Disp: 16 g, Rfl: 5   hydrochlorothiazide (HYDRODIURIL) 25 MG tablet, Take 1 tablet by mouth once daily, Disp: 90 tablet, Rfl: 0   hydrOXYzine (ATARAX/VISTARIL) 10 MG tablet, Take 1 tablet (10 mg total) by mouth 3 (three) times daily as needed for itching., Disp: 90 tablet, Rfl: 1   Multiple Vitamin (MULTI-VITAMIN) tablet, Take 1 tablet by mouth daily. , Disp: , Rfl:    Omega-3 Fatty Acids (FISH OIL PO), Take by mouth daily., Disp: , Rfl:    pantoprazole (PROTONIX) 40 MG tablet, Take 1 tablet by mouth once daily, Disp: 30 tablet, Rfl: 5   rosuvastatin (CRESTOR) 40 MG tablet, Take 1 tablet by mouth once daily, Disp: 90 tablet, Rfl: 0   senna (SENOKOT) 8.6 MG TABS tablet, Take 1 tablet by mouth at bedtime. , Disp: , Rfl:    SUMAtriptan (IMITREX) 50 MG tablet, Take 1 tablet (50 mg total) by mouth every 2 (two) hours as needed for up to 2 doses for migraine. May repeat in 2 hours if headache persists or recurs. Max dose in 24 hours is two tablets., Disp: 10 tablet, Rfl: 0    traMADol (ULTRAM) 50 MG tablet, TAKE 1 TABLET BY MOUTH EVERY 6 HOURS AS NEEDED, Disp: 30 tablet, Rfl: 0   traZODone (DESYREL) 50 MG tablet,  TAKE 1/2 TO 1 (ONE-HALF TO ONE) TABLET BY MOUTH AT BEDTIME AS NEEDED FOR SLEEP, Disp: 30 tablet, Rfl: 5   tretinoin (RETIN-A) 0.05 % cream, Apply 1 application topically at bedtime., Disp: 45 g, Rfl: 0   trolamine salicylate (ASPERCREME) 10 % cream, Apply 1 application topically as needed for muscle pain., Disp: , Rfl:    celecoxib (CELEBREX) 100 MG capsule, TAKE 1 CAPSULE BY MOUTH TWICE DAILY AS NEEDED FOR MODERATE PAIN, Disp: 60 capsule, Rfl: 0   ondansetron (ZOFRAN ODT) 4 MG disintegrating tablet, Allow 1-2 tablets to dissolve in your mouth every 8 hours as needed for nausea/vomiting (Patient not taking: Reported on 03/25/2021), Disp: 30 tablet, Rfl: 0  Physical exam:  Vitals:   03/25/21 1025  BP: 127/74  Pulse: 74  Resp: 18  Temp: 98.2 F (36.8 C)  SpO2: 95%  Weight: 187 lb 12.8 oz (85.2 kg)   Physical Exam Constitutional:      General: She is not in acute distress. Cardiovascular:     Rate and Rhythm: Normal rate and regular rhythm.     Heart sounds: Normal heart sounds.  Pulmonary:     Effort: Pulmonary effort is normal.     Breath sounds: Normal breath sounds.  Abdominal:     General: Bowel sounds are normal.     Palpations: Abdomen is soft.  Skin:    General: Skin is warm and dry.  Neurological:     Mental Status: She is alert and oriented to person, place, and time.    Breast exam: Patient is s/p bilateral lumpectomy.  Right nipple chronically inverted.  No palpable breast masses in bilateral breast.  No palpable bilateral axillary adenopathy.   CMP Latest Ref Rng & Units 02/23/2020  Glucose 70 - 99 mg/dL 98  BUN 6 - 23 mg/dL 19  Creatinine 0.40 - 1.20 mg/dL 0.74  Sodium 135 - 145 mEq/L 142  Potassium 3.5 - 5.1 mEq/L 3.4(L)  Chloride 96 - 112 mEq/L 100  CO2 19 - 32 mEq/L 32  Calcium 8.4 - 10.5 mg/dL 10.1  Total Protein 6.0 -  8.3 g/dL 7.1  Total Bilirubin 0.2 - 1.2 mg/dL 0.5  Alkaline Phos 39 - 117 U/L 55  AST 0 - 37 U/L 19  ALT 0 - 35 U/L 18   CBC Latest Ref Rng & Units 02/23/2020  WBC 4.0 - 10.5 K/uL 7.3  Hemoglobin 12.0 - 15.0 g/dL 13.3  Hematocrit 36.0 - 46.0 % 39.2  Platelets 150.0 - 400.0 K/uL 188.0      Assessment and plan- Patient is a 75 y.o. female with invasive mammary carcinoma of the left breast stage IA pT1c pN0 cM0 ER PR positive and HER-2/neu negative status post lumpectomy and radiation treatment.  This is a routine follow-up visit for breast cancer  Patient is currently not on any hormone therapy and she stopped taking letrozole back in February 2022.  She is currently being observed off all treatment.  Clinically patient is doing well with no concerning signs and symptoms of recurrence based on today's exam.  Mammogram from July 2022 was unremarkable.  I will see her back in 6 months no labs   Visit Diagnosis 1. Encounter for follow-up surveillance of breast cancer      Dr. Randa Evens, MD, MPH [facility] [phone] 03/25/2021 4:02 PM

## 2021-03-25 NOTE — Telephone Encounter (Signed)
Called to speak with Anderson Malta. Kimaria states that it is working and she has to have this medication. Medication has been refilled.

## 2021-03-25 NOTE — Progress Notes (Signed)
Pt states she feels like her rt side looks "different" than what it did; noticed it when she got out of the shower and was drying off.

## 2021-03-27 DIAGNOSIS — H903 Sensorineural hearing loss, bilateral: Secondary | ICD-10-CM | POA: Diagnosis not present

## 2021-03-27 DIAGNOSIS — H6121 Impacted cerumen, right ear: Secondary | ICD-10-CM | POA: Diagnosis not present

## 2021-04-09 DIAGNOSIS — G4733 Obstructive sleep apnea (adult) (pediatric): Secondary | ICD-10-CM | POA: Diagnosis not present

## 2021-04-18 ENCOUNTER — Telehealth: Payer: Self-pay | Admitting: Family Medicine

## 2021-04-18 ENCOUNTER — Telehealth (INDEPENDENT_AMBULATORY_CARE_PROVIDER_SITE_OTHER): Payer: PPO | Admitting: Nurse Practitioner

## 2021-04-18 VITALS — BP 188/94 | HR 80 | Temp 97.9°F

## 2021-04-18 DIAGNOSIS — R051 Acute cough: Secondary | ICD-10-CM

## 2021-04-18 DIAGNOSIS — R6889 Other general symptoms and signs: Secondary | ICD-10-CM | POA: Insufficient documentation

## 2021-04-18 MED ORDER — FLUTICASONE PROPIONATE 50 MCG/ACT NA SUSP: 1.0000 | Freq: Every day | NASAL | 0 refills | Status: DC

## 2021-04-18 MED ORDER — BENZONATATE 200 MG PO CAPS
200.0000 mg | ORAL_CAPSULE | Freq: Two times a day (BID) | ORAL | 0 refills | Status: DC | PRN
Start: 2021-04-18 — End: 2021-12-09

## 2021-04-18 NOTE — Assessment & Plan Note (Signed)
Can continue over-the-counter medications we will send in Tessalon Perles to aid in cough relief.  Continue to monitor

## 2021-04-18 NOTE — Telephone Encounter (Signed)
Spoke with patient. I called to get her virtual visit started.

## 2021-04-18 NOTE — Telephone Encounter (Signed)
Pt returning your call

## 2021-04-18 NOTE — Assessment & Plan Note (Signed)
Given the constellation of symptoms I feel like patient does have flu.  No recent sick contacts patient's daughter is a Marine scientist.  She is unable to come out to the office for testing she did at home COVID test that was negative symptoms have been stayed about the same.  Minor sore throat has improved a little bit we will send in symptomatic treatment she may continue over-the-counter treatments that she was using.  Did discuss strict signs and symptoms when to be seen urgent or emergently over the weekend.  She can always touch base on Monday to see if her symptoms have improved or not.  Continue to monitor

## 2021-04-18 NOTE — Progress Notes (Signed)
Patient ID: Dana Obrien, female    DOB: April 19, 1946, 75 y.o.   MRN: [id number]  Virtual visit completed through [place], a video enabled telemedicine application. Due to national recommendations of social distancing due to COVID-19, a virtual visit is felt to be most appropriate for this patient at this time. Reviewed limitations, risks, security and privacy concerns of performing a virtual visit and the availability of in person appointments. I also reviewed that there may be a patient responsible charge related to this service. The patient agreed to proceed.   Patient location: home Provider location: [place] at [facility], office Persons participating in this virtual visit: patient, provider   If any vitals were documented, they were collected by patient at home unless specified below.    BP (!) 188/94   Pulse 80   Temp 97.9 F (36.6 C) Comment: per patient   CC: Sore throat Subjective:   HPI: Dana Obrien is a 75 y.o. female presenting on 04/18/2021 for Sore Throat (Sx started on 04/13/21-head congestion, headache, chest congestion, cough-productive thick yellow phlegm, sneezing, runny nose, temp 100.8 last night, chills, sweats, body aches. Covid test negative on 04/17/21)  Symptoms 04/13/2021 Took at home covid test 04/17/2021 Pfizer vaccines No sick contacts  Has been taking dyquill and nyquill without great relief   States her daughter is a nurse  Symptoms are about the same although throat seems some better today    Relevant past medical, surgical, family and social history reviewed and updated as indicated. Interim medical history since our last visit reviewed. Allergies and medications reviewed and updated. Outpatient Medications Prior to Visit  Medication Sig Dispense Refill   albuterol (VENTOLIN HFA) 108 (90 Base) MCG/ACT inhaler Inhale 2 puffs into the lungs every 6 (six) hours as needed for wheezing or shortness of breath. 8 g 0   ALPRAZolam  (XANAX) 0.25 MG tablet Take 1 tablet by mouth twice daily as needed for anxiety 20 tablet 0   celecoxib (CELEBREX) 100 MG capsule TAKE 1 CAPSULE BY MOUTH TWICE DAILY AS NEEDED FOR MODERATE PAIN 60 capsule 0   diclofenac Sodium (VOLTAREN) 1 % GEL Apply 2 g topically 4 (four) times daily as needed.     EQ ALLERGY RELIEF, CETIRIZINE, 10 MG tablet Take 1 tablet by mouth once daily 90 tablet 0   FLUoxetine HCl 60 MG TABS Take 1 tablet by mouth once daily 90 tablet 0   hydrochlorothiazide (HYDRODIURIL) 25 MG tablet Take 1 tablet by mouth once daily 90 tablet 0   hydrOXYzine (ATARAX/VISTARIL) 10 MG tablet Take 1 tablet (10 mg total) by mouth 3 (three) times daily as needed for itching. 90 tablet 1   Multiple Vitamin (MULTI-VITAMIN) tablet Take 1 tablet by mouth daily.      Omega-3 Fatty Acids (FISH OIL PO) Take by mouth daily.     ondansetron (ZOFRAN ODT) 4 MG disintegrating tablet Allow 1-2 tablets to dissolve in your mouth every 8 hours as needed for nausea/vomiting 30 tablet 0   pantoprazole (PROTONIX) 40 MG tablet Take 1 tablet by mouth once daily 30 tablet 5   rosuvastatin (CRESTOR) 40 MG tablet Take 1 tablet by mouth once daily 90 tablet 0   senna (SENOKOT) 8.6 MG TABS tablet Take 1 tablet by mouth at bedtime.      SUMAtriptan (IMITREX) 50 MG tablet Take 1 tablet (50 mg total) by mouth every 2 (two) hours as needed for up to 2 doses for migraine. May repeat in 2  hours if headache persists or recurs. Max dose in 24 hours is two tablets. 10 tablet 0   traMADol (ULTRAM) 50 MG tablet TAKE 1 TABLET BY MOUTH EVERY 6 HOURS AS NEEDED 30 tablet 0   traZODone (DESYREL) 50 MG tablet TAKE 1/2 TO 1 (ONE-HALF TO ONE) TABLET BY MOUTH AT BEDTIME AS NEEDED FOR SLEEP 30 tablet 5   tretinoin (RETIN-A) 0.05 % cream Apply 1 application topically at bedtime. 45 g 0   trolamine salicylate (ASPERCREME) 10 % cream Apply 1 application topically as needed for muscle pain.     fluticasone (FLONASE) 50 MCG/ACT nasal spray Place  1 spray into both nostrils daily. (Patient not taking: Reported on 04/18/2021) 16 g 5   No facility-administered medications prior to visit.     Per HPI unless specifically indicated in ROS section below Review of Systems  Constitutional:  Positive for chills, fatigue and fever.  HENT:  Positive for congestion, ear pain (full) and sore throat. Negative for sinus pressure and sinus pain.   Respiratory:  Positive for cough (yellow green stuff). Negative for shortness of breath.   Cardiovascular:  Negative for chest pain.  Gastrointestinal:  Negative for abdominal pain, diarrhea, nausea and vomiting.  Musculoskeletal:  Positive for arthralgias and myalgias.  Neurological:  Positive for headaches.  Objective:  BP (!) 188/94   Pulse 80   Temp 97.9 F (36.6 C) Comment: per patient  Wt Readings from Last 3 Encounters:  03/25/21 187 lb 12.8 oz (85.2 kg)  01/15/21 187 lb 9.6 oz (85.1 kg)  11/08/20 190 lb 6.4 oz (86.4 kg)       Physical exam: Gen: alert, NAD, not ill appearing Pulm: speaks in complete sentences without increased work of breathing Psych: normal mood, normal thought content      Results for orders placed or performed during the hospital encounter of 11/29/20  SARS CORONAVIRUS 2 (TAT 6-24 HRS) Nasopharyngeal Nasopharyngeal Swab   Specimen: Nasopharyngeal Swab  Result Value Ref Range   SARS Coronavirus 2 NEGATIVE NEGATIVE   Assessment & Plan:   Problem List Items Addressed This Visit       Other   Acute cough    Can continue over-the-counter medications we will send in Tessalon Perles to aid in cough relief.  Continue to monitor      Relevant Medications   benzonatate (TESSALON) 200 MG capsule   Flu-like symptoms - Primary    Given the constellation of symptoms I feel like patient does have flu.  No recent sick contacts patient's daughter is a Marine scientist.  She is unable to come out to the office for testing she did at home COVID test that was negative symptoms have  been stayed about the same.  Minor sore throat has improved a little bit we will send in symptomatic treatment she may continue over-the-counter treatments that she was using.  Did discuss strict signs and symptoms when to be seen urgent or emergently over the weekend.  She can always touch base on Monday to see if her symptoms have improved or not.  Continue to monitor      Relevant Medications   benzonatate (TESSALON) 200 MG capsule   fluticasone (FLONASE) 50 MCG/ACT nasal spray     No orders of the defined types were placed in this encounter.  No orders of the defined types were placed in this encounter.   Phone call lasted 8 minutes  I discussed the assessment and treatment plan with the patient. The patient was  provided an opportunity to ask questions and all were answered. The patient agreed with the plan and demonstrated an understanding of the instructions. The patient was advised to call back or seek an in-person evaluation if the symptoms worsen or if the condition fails to improve as anticipated.  Follow up plan: No follow-ups on file.  Romilda Garret, NP

## 2021-04-28 ENCOUNTER — Other Ambulatory Visit: Payer: Self-pay | Admitting: Family Medicine

## 2021-04-28 NOTE — Telephone Encounter (Signed)
LOV: 11/08/2020  NOV: 05/06/2021

## 2021-05-06 ENCOUNTER — Encounter: Payer: Self-pay | Admitting: Family Medicine

## 2021-05-06 ENCOUNTER — Ambulatory Visit (INDEPENDENT_AMBULATORY_CARE_PROVIDER_SITE_OTHER): Payer: PPO | Admitting: Family Medicine

## 2021-05-06 ENCOUNTER — Telehealth: Payer: Self-pay | Admitting: Family Medicine

## 2021-05-06 ENCOUNTER — Other Ambulatory Visit: Payer: Self-pay

## 2021-05-06 DIAGNOSIS — J329 Chronic sinusitis, unspecified: Secondary | ICD-10-CM | POA: Insufficient documentation

## 2021-05-06 DIAGNOSIS — I10 Essential (primary) hypertension: Secondary | ICD-10-CM | POA: Diagnosis not present

## 2021-05-06 DIAGNOSIS — J0140 Acute pansinusitis, unspecified: Secondary | ICD-10-CM

## 2021-05-06 MED ORDER — AMOXICILLIN-POT CLAVULANATE 875-125 MG PO TABS: 1.0000 | ORAL_TABLET | Freq: Two times a day (BID) | ORAL | 0 refills | Status: DC

## 2021-05-06 MED ORDER — HYDROCODONE BIT-HOMATROP MBR 5-1.5 MG/5ML PO SOLN: 5.0000 mL | Freq: Three times a day (TID) | ORAL | 0 refills | Status: DC | PRN

## 2021-05-06 MED ORDER — ONDANSETRON HCL 4 MG PO TABS: 4.0000 mg | ORAL_TABLET | Freq: Three times a day (TID) | ORAL | 0 refills | Status: DC | PRN

## 2021-05-06 NOTE — Assessment & Plan Note (Signed)
Elevated at times in the setting of her current illness.  I encouraged her to continue to monitor her blood pressure and if it goes back to 200/100 and she has symptoms associated with this such as chest pain or shortness of breath she would need to go to the emergency room for evaluation.  She will continue hydrochlorothiazide 25 mg once daily.

## 2021-05-06 NOTE — Progress Notes (Signed)
Virtual Visit via telephone Note  This visit type was conducted due to national recommendations for restrictions regarding the COVID-19 pandemic (e.g. social distancing).  This format is felt to be most appropriate for this patient at this time.  All issues noted in this document were discussed and addressed.  No physical exam was performed (except for noted visual exam findings with Video Visits).   I connected with Gwenyth Bouillon today at 10:00 AM EST by telephone and verified that I am speaking with the correct person using two identifiers. Location patient: home Location provider: work  Persons participating in the virtual visit: patient, provider  I discussed the limitations, risks, security and privacy concerns of performing an evaluation and management service by telephone and the availability of in person appointments. I also discussed with the patient that there may be a patient responsible charge related to this service. The patient expressed understanding and agreed to proceed.  Interactive audio and video telecommunications were attempted between this provider and patient, however failed, due to patient having technical difficulties OR patient did not have access to video capability.  We continued and completed visit with audio only.   Reason for visit: f/u.  HPI: HYPERTENSION Disease Monitoring Home BP Monitoring 200/100 a couple of occasions while sick, today 101/58 Chest pain- no    Dyspnea- only with her respiratory illness Medications Compliance-  taking HCTZ  Edema- no BMET    Component Value Date/Time   NA 142 02/23/2020 1011   NA 139 11/20/2013 1534   K 3.4 (L) 02/23/2020 1011   K 3.9 11/20/2013 1534   CL 100 02/23/2020 1011   CL 105 11/20/2013 1534   CO2 32 02/23/2020 1011   CO2 32 11/20/2013 1534   GLUCOSE 98 02/23/2020 1011   GLUCOSE 104 (H) 11/20/2013 1534   BUN 19 02/23/2020 1011   BUN 20 (H) 11/20/2013 1534   CREATININE 0.74 02/23/2020 1011    CREATININE 1.12 (H) 11/01/2015 1622   CALCIUM 10.1 02/23/2020 1011   CALCIUM 9.2 11/20/2013 1534   GFRNONAA >60 04/10/2019 0240   GFRNONAA >60 11/20/2013 1534   GFRAA >60 04/10/2019 0240   GFRAA >60 11/20/2013 1534   Influenza: Patient notes she got sick 3 weeks before Thanksgiving.  She did improve with that illness though the Sunday after Thanksgiving she started to get sick again.  She was exposed to influenza at her home.  She had a negative COVID test.  She notes a dry cough with chest and head congestion.  She is blowing thick green mucus out of her nose.  She had a fever of 102 F yesterday.  She feels sore in her chest with coughing.  Some shortness of breath only when she exerts herself.  Some headache.  Some nausea.  Her ears feel full.  She has had intermittent sore throat.  She has not improved over the last 9 days.  She has been using Motrin.  ROS: See pertinent positives and negatives per HPI.  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   Cancer [facility]) 2004   right lumpectomy, chemotherapy and radiation Dr. Jeb Levering   CPAP (continuous positive airway pressure) dependence  Depression    GERD (gastroesophageal reflux disease)    Hypertension    Migraine    Personal history of chemotherapy 2004   Personal history of radiation therapy 2004   Personal history of radiation therapy 2019   PONV (postoperative nausea and vomiting)    Shingles    Sleep apnea    uses CPAP, severe OSA   Vitamin D deficiency    IN THE PAST    Past Surgical History:  Procedure Laterality Date   ABDOMINAL HYSTERECTOMY     ANTERIOR AND POSTERIOR REPAIR     BREAST BIOPSY Right 2004   stereo. 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  3.5 cm maximum diameter, minimal margins 5 mm.  In situ component less than 10%.   BREAST LUMPECTOMY Right 2004   BREAST LUMPECTOMY Left 09/17/2017   Procedure: BREAST EXCISION, SENTINEL NODE BIOPSY;  Surgeon: Byrnett, Jeffrey W, MD;  Location: [facility] ORS;  Service: General;  Laterality: Left;   COLONOSCOPY  2015   COLONOSCOPY WITH PROPOFOL N/A 10/04/2019   Procedure: COLONOSCOPY WITH PROPOFOL;  Surgeon: Byrnett, Jeffrey W, MD;  Location: [facility] ENDOSCOPY;  Service: Endoscopy;  Laterality: N/A;   DILATION AND CURETTAGE OF UTERUS     ETHMOIDECTOMY Bilateral 11/05/2016   Procedure: ETHMOIDECTOMY;  Surgeon: Bates, Dwight, MD;  Location: [facility] OR;  Service: ENT;  Laterality: Bilateral;   LAPAROTOMY     MAXILLARY ANTROSTOMY Bilateral 11/05/2016   Procedure: MAXILLARY ANTROSTOMY;  Surgeon: Bates, Dwight, MD;  Location: [facility] OR;  Service: ENT;  Laterality: Bilateral;   SINUS ENDO W/FUSION Bilateral 11/05/2016   Procedure: FRONTAL RECESS EXPLORATION;  Surgeon: Bates, Dwight, MD;  Location: [facility] OR;  Service: ENT;  Laterality: Bilateral;  BILATERAL ENDOSCOPIC SINUS SURGERY WITH FUSION   SINUS EXPLORATION  11/05/2016   SPHENOIDECTOMY Bilateral 11/05/2016   Procedure: SPHENOIDECTOMY;  Surgeon: Bates, Dwight, MD;  Location: [facility] OR;  Service: ENT;  Laterality: Bilateral;    Family History  Problem Relation Age of Onset   Heart disease Mother    Hyperlipidemia Mother    Hypertension Mother    Hypertension Father    Osteoarthritis Father    Stroke Father    Alzheimer's disease Sister    Alzheimer's disease Brother    Brain cancer Brother    Healthy Daughter    Healthy Son    Breast cancer Neg Hx     SOCIAL HX: Non-smoker   Current Outpatient Medications:    albuterol (VENTOLIN HFA) 108 (90 Base) MCG/ACT inhaler, Inhale 2 puffs into the  lungs every 6 (six) hours as needed for wheezing or shortness of breath., Disp: 8 g, Rfl: 0   ALPRAZolam (XANAX) 0.25 MG tablet, Take 1 tablet by mouth twice daily as needed for anxiety, Disp: 20 tablet, Rfl: 0   amoxicillin-clavulanate (AUGMENTIN) 875-125 MG tablet, Take 1 tablet by mouth 2 (two) times daily., Disp: 14 tablet, Rfl: 0   benzonatate (TESSALON) 200 MG capsule, Take 1 capsule (200 mg total) by mouth 2 (two) times daily as needed for cough., Disp: 20 capsule, Rfl: 0   celecoxib (CELEBREX) 100 MG capsule, TAKE 1 CAPSULE BY MOUTH TWICE DAILY AS NEEDED FOR MODERATE PAIN, Disp: 60 capsule, Rfl: 0   diclofenac Sodium (VOLTAREN) 1 % GEL, Apply 2 g topically 4 (four) times daily as needed., Disp: , Rfl:      EQ ALLERGY RELIEF, CETIRIZINE, 10 MG tablet, Take 1 tablet by mouth once daily, Disp: 90 tablet, Rfl: 0   FLUoxetine HCl 60 MG TABS, Take 1 tablet by mouth once daily, Disp: 90 tablet, Rfl: 0   fluticasone (FLONASE) 50 MCG/ACT nasal spray, Place 1 spray into both nostrils daily., Disp: 16 g, Rfl: 0   hydrochlorothiazide (HYDRODIURIL) 25 MG tablet, Take 1 tablet by mouth once daily, Disp: 90 tablet, Rfl: 0   HYDROcodone bit-homatropine (HYCODAN) 5-1.5 MG/5ML syrup, Take 5 mLs by mouth every 8 (eight) hours as needed for cough., Disp: 120 mL, Rfl: 0   hydrOXYzine (ATARAX/VISTARIL) 10 MG tablet, Take 1 tablet (10 mg total) by mouth 3 (three) times daily as needed for itching., Disp: 90 tablet, Rfl: 1   Multiple Vitamin (MULTI-VITAMIN) tablet, Take 1 tablet by mouth daily. , Disp: , Rfl:    Omega-3 Fatty Acids (FISH OIL PO), Take by mouth daily., Disp: , Rfl:    ondansetron (ZOFRAN) 4 MG tablet, Take 1 tablet (4 mg total) by mouth every 8 (eight) hours as needed for nausea or vomiting., Disp: 20 tablet, Rfl: 0   pantoprazole (PROTONIX) 40 MG tablet, Take 1 tablet by mouth once daily, Disp: 30 tablet, Rfl: 5   rosuvastatin (CRESTOR) 40 MG tablet, Take 1 tablet by mouth once daily, Disp: 90 tablet,  Rfl: 0   senna (SENOKOT) 8.6 MG TABS tablet, Take 1 tablet by mouth at bedtime. , Disp: , Rfl:    SUMAtriptan (IMITREX) 50 MG tablet, Take 1 tablet (50 mg total) by mouth every 2 (two) hours as needed for up to 2 doses for migraine. May repeat in 2 hours if headache persists or recurs. Max dose in 24 hours is two tablets., Disp: 10 tablet, Rfl: 0   traMADol (ULTRAM) 50 MG tablet, TAKE 1 TABLET BY MOUTH EVERY 6 HOURS AS NEEDED, Disp: 30 tablet, Rfl: 0   traZODone (DESYREL) 50 MG tablet, TAKE 1/2 TO 1 (ONE-HALF TO ONE) TABLET BY MOUTH AT BEDTIME AS NEEDED FOR SLEEP, Disp: 30 tablet, Rfl: 5   tretinoin (RETIN-A) 0.05 % cream, Apply 1 application topically at bedtime., Disp: 45 g, Rfl: 0   trolamine salicylate (ASPERCREME) 10 % cream, Apply 1 application topically as needed for muscle pain., Disp: , Rfl:   EXAM: This is a telephone visit and thus no exam was completed.  ASSESSMENT AND PLAN:  Discussed the following assessment and plan:  Problem List Items Addressed This Visit     Hypertension    Elevated at times in the setting of her current illness.  I encouraged her to continue to monitor her blood pressure and if it goes back to 200/100 and she has symptoms associated with this such as chest pain or shortness of breath she would need to go to the emergency room for evaluation.  She will continue hydrochlorothiazide 25 mg once daily.      Sinusitis    I believe the patient currently has sinusitis.  She likely had influenza to start with though 9 to 10 days into the illness she should be improved with regards to the influenza.  We will start her on Augmentin to treat sinusitis.  I did discuss getting a chest x-ray given her chest congestion and shortness of breath though she declines this as she does not have transportation to the hospital for imaging.  We will treat her cough with Hycodan.  If she is not improving over the next 2 to 3 days she was   advised she would need to get the chest x-ray  completed.  Zofran for nausea.  She was advised to seek medical attention for cough productive of blood, chest pain, or shortness of breath.      Relevant Medications   amoxicillin-clavulanate (AUGMENTIN) 875-125 MG tablet   HYDROcodone bit-homatropine (HYCODAN) 5-1.5 MG/5ML syrup   ondansetron (ZOFRAN) 4 MG tablet    Return in about 3 months (around 08/04/2021) for htn.   I discussed the assessment and treatment plan with the patient. The patient was provided an opportunity to ask questions and all were answered. The patient agreed with the plan and demonstrated an understanding of the instructions.   The patient was advised to call back or seek an in-person evaluation if the symptoms worsen or if the condition fails to improve as anticipated.  I provided 13 minutes of non-face-to-face time during this encounter.   Eric Sonnenberg, MD   

## 2021-05-06 NOTE — Telephone Encounter (Signed)
Please call the patient and advise her to monitor for drowsiness with the Hycodan.  If she is excessively drowsy she needs to let us know.  She should not take the tramadol while she is taking Hycodan for her cough.

## 2021-05-06 NOTE — Telephone Encounter (Signed)
I called and spoke  with the patient and informed her to monitor her drowsiness with the medication the provider sent for her cough.  I also informed her to not take the medication with tramadol and she understood.  Patient stated she forgot to inform the provider that she needed a new Rx for her Imitrex, she stated she does not take it daily or often but when she has a migraine she likes to have it on hand to keep from going to the Er.  Meliyah Simon,cma

## 2021-05-06 NOTE — Assessment & Plan Note (Signed)
I believe the patient currently has sinusitis.  She likely had influenza to start with though 9 to 10 days into the illness she should be improved with regards to the influenza.  We will start her on Augmentin to treat sinusitis.  I did discuss getting a chest x-ray given her chest congestion and shortness of breath though she declines this as she does not have transportation to the hospital for imaging.  We will treat her cough with Hycodan.  If she is not improving over the next 2 to 3 days she was advised she would need to get the chest x-ray completed.  Zofran for nausea.  She was advised to seek medical attention for cough productive of blood, chest pain, or shortness of breath.

## 2021-05-07 MED ORDER — SUMATRIPTAN SUCCINATE 50 MG PO TABS: 50.0000 mg | ORAL_TABLET | ORAL | 0 refills | Status: DC | PRN

## 2021-05-07 NOTE — Addendum Note (Signed)
Addended by: Caryl Bis, Ceara Wrightson G on: 05/07/2021 12:13 PM   Modules accepted: Orders

## 2021-05-07 NOTE — Telephone Encounter (Signed)
Imitrex sent to pharmacy.  

## 2021-05-08 NOTE — Telephone Encounter (Signed)
I called and informed the patient that the Imitrex was sent to the pharmacy and she understood.  Patient wanted the provider to know she is feeling much better.  Nadeem Romanoski,cma

## 2021-05-08 NOTE — Telephone Encounter (Signed)
Noted  

## 2021-05-09 DIAGNOSIS — G4733 Obstructive sleep apnea (adult) (pediatric): Secondary | ICD-10-CM | POA: Diagnosis not present

## 2021-05-22 ENCOUNTER — Encounter: Payer: Self-pay | Admitting: Family Medicine

## 2021-05-28 ENCOUNTER — Encounter: Payer: Self-pay | Admitting: Family Medicine

## 2021-05-28 MED ORDER — ALPRAZOLAM 0.25 MG PO TABS: 0.2500 mg | ORAL_TABLET | Freq: Two times a day (BID) | ORAL | 0 refills | Status: DC | PRN

## 2021-06-09 DIAGNOSIS — G4733 Obstructive sleep apnea (adult) (pediatric): Secondary | ICD-10-CM | POA: Diagnosis not present

## 2021-06-11 NOTE — Telephone Encounter (Signed)
Patient called about the status of MyChart message.

## 2021-06-13 ENCOUNTER — Other Ambulatory Visit: Payer: Self-pay | Admitting: Family

## 2021-06-13 DIAGNOSIS — G4733 Obstructive sleep apnea (adult) (pediatric): Secondary | ICD-10-CM

## 2021-06-27 ENCOUNTER — Other Ambulatory Visit: Payer: Self-pay | Admitting: Family Medicine

## 2021-06-27 DIAGNOSIS — I10 Essential (primary) hypertension: Secondary | ICD-10-CM

## 2021-06-27 DIAGNOSIS — R7303 Prediabetes: Secondary | ICD-10-CM

## 2021-06-27 DIAGNOSIS — E559 Vitamin D deficiency, unspecified: Secondary | ICD-10-CM

## 2021-06-29 NOTE — Telephone Encounter (Signed)
Refills sent to pharmacy. The patient is overdue for labs. I placed orders. Please call her and get these scheduled. Thanks.

## 2021-07-01 ENCOUNTER — Other Ambulatory Visit: Payer: Self-pay | Admitting: Family Medicine

## 2021-07-01 ENCOUNTER — Encounter: Payer: Self-pay | Admitting: Family Medicine

## 2021-07-01 ENCOUNTER — Other Ambulatory Visit: Payer: Self-pay

## 2021-07-01 ENCOUNTER — Other Ambulatory Visit (INDEPENDENT_AMBULATORY_CARE_PROVIDER_SITE_OTHER): Payer: PPO

## 2021-07-01 DIAGNOSIS — E559 Vitamin D deficiency, unspecified: Secondary | ICD-10-CM

## 2021-07-01 DIAGNOSIS — R32 Unspecified urinary incontinence: Secondary | ICD-10-CM

## 2021-07-01 DIAGNOSIS — I10 Essential (primary) hypertension: Secondary | ICD-10-CM | POA: Diagnosis not present

## 2021-07-01 DIAGNOSIS — R7303 Prediabetes: Secondary | ICD-10-CM

## 2021-07-01 LAB — COMPREHENSIVE METABOLIC PANEL
ALT: 13 U/L (ref 0–35)
AST: 16 U/L (ref 0–37)
Albumin: 4.3 g/dL (ref 3.5–5.2)
Alkaline Phosphatase: 64 U/L (ref 39–117)
BUN: 17 mg/dL (ref 6–23)
CO2: 30 mEq/L (ref 19–32)
Calcium: 9.2 mg/dL (ref 8.4–10.5)
Chloride: 99 mEq/L (ref 96–112)
Creatinine, Ser: 0.95 mg/dL (ref 0.40–1.20)
GFR: 59.28 mL/min — ABNORMAL LOW (ref >60.00)
Glucose, Bld: 127 mg/dL — ABNORMAL HIGH (ref 70–99)
Potassium: 3.6 mEq/L (ref 3.5–5.1)
Sodium: 138 mEq/L (ref 135–145)
Total Bilirubin: 0.5 mg/dL (ref 0.2–1.2)
Total Protein: 6.9 g/dL (ref 6.0–8.3)

## 2021-07-01 LAB — POCT URINALYSIS DIPSTICK
Bilirubin, UA: NEGATIVE
Blood, UA: NEGATIVE
Glucose, UA: NEGATIVE
Ketones, UA: NEGATIVE
Nitrite, UA: NEGATIVE
Protein, UA: NEGATIVE
Spec Grav, UA: 1.01 (ref 1.010–1.025)
Urobilinogen, UA: 1 E.U./dL
pH, UA: 5 (ref 5.0–8.0)

## 2021-07-01 LAB — LIPID PANEL
Cholesterol: 158 mg/dL (ref 0–200)
HDL: 63 mg/dL (ref >39.00)
LDL Cholesterol: 63 mg/dL (ref 0–99)
NonHDL: 95.06
Total CHOL/HDL Ratio: 3
Triglycerides: 160 mg/dL — ABNORMAL HIGH (ref 0.0–149.0)
VLDL: 32 mg/dL (ref 0.0–40.0)

## 2021-07-01 LAB — HEMOGLOBIN A1C: Hgb A1c MFr Bld: 5.8 % (ref 4.6–6.5)

## 2021-07-02 ENCOUNTER — Other Ambulatory Visit: Payer: Self-pay | Admitting: Family Medicine

## 2021-07-02 ENCOUNTER — Other Ambulatory Visit: Payer: Self-pay | Admitting: Nurse Practitioner

## 2021-07-02 DIAGNOSIS — R6889 Other general symptoms and signs: Secondary | ICD-10-CM

## 2021-07-02 LAB — VITAMIN D 25 HYDROXY (VIT D DEFICIENCY, FRACTURES): VITD: 28.71 ng/mL — ABNORMAL LOW (ref 30.00–100.00)

## 2021-07-02 LAB — URINE CULTURE
MICRO NUMBER:: 12943385
SPECIMEN QUALITY:: ADEQUATE

## 2021-07-02 MED ORDER — ROSUVASTATIN CALCIUM 40 MG PO TABS: 40.0000 mg | ORAL_TABLET | Freq: Every day | ORAL | 2 refills | Status: DC

## 2021-07-02 MED ORDER — CELECOXIB 100 MG PO CAPS: 100.0000 mg | ORAL_CAPSULE | Freq: Two times a day (BID) | ORAL | 0 refills | Status: DC

## 2021-07-02 MED ORDER — HYDROCHLOROTHIAZIDE 25 MG PO TABS: 25.0000 mg | ORAL_TABLET | Freq: Every day | ORAL | 0 refills | Status: DC

## 2021-07-02 MED ORDER — FLUTICASONE PROPIONATE 50 MCG/ACT NA SUSP: 1.0000 | Freq: Every day | NASAL | 0 refills | Status: DC

## 2021-07-02 MED ORDER — TRAMADOL HCL 50 MG PO TABS: 50.0000 mg | ORAL_TABLET | Freq: Four times a day (QID) | ORAL | 0 refills | Status: DC | PRN

## 2021-07-02 MED ORDER — FLUOXETINE HCL 60 MG PO TABS: 1.0000 | ORAL_TABLET | Freq: Every day | ORAL | 2 refills | Status: DC

## 2021-07-02 MED ORDER — ALPRAZOLAM 0.25 MG PO TABS: 0.2500 mg | ORAL_TABLET | Freq: Two times a day (BID) | ORAL | 0 refills | Status: DC | PRN

## 2021-07-02 NOTE — Telephone Encounter (Signed)
Okay to refill?  LOV: 05/06/21 NOV: 07/11/21

## 2021-07-09 ENCOUNTER — Telehealth: Payer: Self-pay

## 2021-07-09 NOTE — Telephone Encounter (Signed)
Called and spoke to patient who states she is not currently using bipap d/t pressure being to high.

## 2021-07-11 ENCOUNTER — Encounter: Payer: Self-pay | Admitting: Family Medicine

## 2021-07-11 ENCOUNTER — Other Ambulatory Visit: Payer: Self-pay

## 2021-07-11 ENCOUNTER — Ambulatory Visit: Payer: PPO | Admitting: Adult Health

## 2021-07-11 ENCOUNTER — Telehealth (INDEPENDENT_AMBULATORY_CARE_PROVIDER_SITE_OTHER): Payer: PPO | Admitting: Family Medicine

## 2021-07-11 ENCOUNTER — Encounter: Payer: Self-pay | Admitting: Adult Health

## 2021-07-11 VITALS — BP 118/80 | HR 83 | Temp 97.6°F | Ht 64.0 in | Wt 194.0 lb

## 2021-07-11 DIAGNOSIS — I10 Essential (primary) hypertension: Secondary | ICD-10-CM

## 2021-07-11 DIAGNOSIS — F32A Depression, unspecified: Secondary | ICD-10-CM

## 2021-07-11 DIAGNOSIS — J069 Acute upper respiratory infection, unspecified: Secondary | ICD-10-CM | POA: Insufficient documentation

## 2021-07-11 DIAGNOSIS — G4733 Obstructive sleep apnea (adult) (pediatric): Secondary | ICD-10-CM | POA: Diagnosis not present

## 2021-07-11 DIAGNOSIS — N3941 Urge incontinence: Secondary | ICD-10-CM

## 2021-07-11 DIAGNOSIS — F419 Anxiety disorder, unspecified: Secondary | ICD-10-CM | POA: Diagnosis not present

## 2021-07-11 DIAGNOSIS — E559 Vitamin D deficiency, unspecified: Secondary | ICD-10-CM

## 2021-07-11 NOTE — Progress Notes (Signed)
_0  ID: Dana Obrien, female    DOB: 1946/05/20, 76 y.o.   MRN: [id number]  Chief Complaint  Patient presents with   Follow-up    Referring provider: Leone Haven, MD  HPI: 76 year old female never smoker followed for obstructive sleep apnea  TEST/EVENTS :  split-night sleep study on 12/04/2020 that showed severe obstructive sleep apnea, AHI 44/hr with SPO2 low 83%.  He was recommended that she start BiPAP therapy with pressure setting of 22/19 centimeters H2O, heated humidity and ResMed F 30 I fullface mask size small.  07/11/2021 Follow up ; OSA  Patient returns for a 34-monthfollow-up.  Patient has underlying severe obstructive sleep apnea.  She had a split-night sleep study December 04, 2020 that showed severe sleep apnea with AHI of 44/hour with SPO2 low at 83%.  She had optimal control on BiPAP at 22/19 cm H2O.  Patient says unfortunately she is been unable to wear her BiPAP as she feels that the pressure is way too high and was not tolerable. She has recently gotten new full face small mask to see if this is more comfortable.  Has daytime sleepiness, restless sleep and snoring .       Allergies  Allergen Reactions   Lidocaine Palpitations   Codeine Nausea Only and Nausea And Vomiting   Morphine Nausea Only   Niacin Rash   Niacin And Related Rash    ANTIHYPERLIPEMICS    Immunization History  Administered Date(s) Administered   Fluad Quad(high Dose 65+) 02/23/2020   Influenza Whole 06/04/2008   Influenza, High Dose Seasonal PF 08/11/2017, 05/30/2018   Influenza,inj,Quad PF,6+ Mos 03/06/2014   Influenza-Unspecified 03/21/2012, 03/01/2013, 04/30/2015   PFIZER(Purple Top)SARS-COV-2 Vaccination 06/08/2019, 07/01/2019   Pneumococcal Conjugate-13 10/25/2013   Pneumococcal Polysaccharide-23 10/19/2012   Td 06/01/2005    Past Medical History:  Diagnosis Date   Anxiety    Arthritis    Asthma    [place] [place]   [place]   [place]   [place]   [place] [place]   T1c, N0, ER 90%, PR 90%, 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   Cancer ([facility] 2004   right lumpectomy, chemotherapy and radiation Dr. CJeb Levering  CPAP (continuous positive airway pressure) dependence    Depression    GERD (gastroesophageal reflux disease)    Hypertension    Migraine    Personal history of chemotherapy 2004   Personal history of radiation therapy 2004   Personal history of radiation therapy 2019   PONV (postoperative nausea and vomiting)    Shingles    Sleep apnea    uses CPAP, severe OSA   Vitamin D deficiency    IN THE PAST    Tobacco History: Social History   Tobacco Use  Smoking Status Never  Smokeless Tobacco Never   Counseling given: Not Answered   Outpatient Medications Prior to Visit  Medication Sig Dispense Refill   albuterol (VENTOLIN HFA) 108 (90 Base) MCG/ACT inhaler Inhale 2 puffs into the lungs every 6 (six) hours as needed for wheezing or shortness of breath. 8 g 0   ALPRAZolam (XANAX) 0.25 MG tablet Take 1 tablet (0.25 mg total) by mouth 2 (two) times daily as needed. for anxiety 20 tablet 0   benzonatate (TESSALON) 200 MG capsule Take 1 capsule (200 mg total) by mouth 2 (two) times daily as needed for cough. 20 capsule 0   celecoxib (CELEBREX) 100 MG capsule  Take 1 capsule (100 mg total) by mouth 2 (two) times daily. 60 capsule 0   diclofenac Sodium (VOLTAREN) 1 % GEL Apply 2 g topically 4 (four) times daily as needed.     EQ ALLERGY RELIEF, CETIRIZINE, 10 MG tablet Take 1 tablet by mouth once daily 90 tablet 0   FLUoxetine HCl 60 MG TABS Take 1 tablet by mouth daily. 30 tablet 2   fluticasone (FLONASE) 50 MCG/ACT nasal spray Place 1 spray into both nostrils daily. 16 g 0   hydrochlorothiazide (HYDRODIURIL) 25 MG tablet Take 1 tablet (25 mg total) by mouth daily. 90 tablet 0   hydrOXYzine (ATARAX/VISTARIL) 10 MG tablet Take 1 tablet (10 mg total) by  mouth 3 (three) times daily as needed for itching. 90 tablet 1   Multiple Vitamin (MULTI-VITAMIN) tablet Take 1 tablet by mouth daily.      Omega-3 Fatty Acids (FISH OIL PO) Take by mouth daily.     ondansetron (ZOFRAN) 4 MG tablet Take 1 tablet (4 mg total) by mouth every 8 (eight) hours as needed for nausea or vomiting. 20 tablet 0   pantoprazole (PROTONIX) 40 MG tablet Take 1 tablet by mouth once daily 30 tablet 5   rosuvastatin (CRESTOR) 40 MG tablet Take 1 tablet (40 mg total) by mouth daily. 30 tablet 2   senna (SENOKOT) 8.6 MG TABS tablet Take 1 tablet by mouth at bedtime.      SUMAtriptan (IMITREX) 50 MG tablet Take 1 tablet (50 mg total) by mouth every 2 (two) hours as needed for up to 2 doses for migraine. May repeat in 2 hours if headache persists or recurs. Max dose in 24 hours is two tablets. 10 tablet 0   traMADol (ULTRAM) 50 MG tablet Take 1 tablet (50 mg total) by mouth every 6 (six) hours as needed. 30 tablet 0   traZODone (DESYREL) 50 MG tablet TAKE 1/2 TO 1 (ONE-HALF TO ONE) TABLET BY MOUTH AT BEDTIME AS NEEDED FOR SLEEP 30 tablet 5   tretinoin (RETIN-A) 0.05 % cream Apply 1 application topically at bedtime. 45 g 0   trolamine salicylate (ASPERCREME) 10 % cream Apply 1 application topically as needed for muscle pain.     amoxicillin-clavulanate (AUGMENTIN) 875-125 MG tablet Take 1 tablet by mouth 2 (two) times daily. 14 tablet 0   No facility-administered medications prior to visit.     Review of Systems:   Constitutional:   No  weight loss, night sweats,  Fevers, chills,  +fatigue, or  lassitude.  HEENT:   No headaches,  Difficulty swallowing,  Tooth/dental problems, or  Sore throat,                No sneezing, itching, ear ache, nasal congestion, post nasal drip,   CV:  No chest pain,  Orthopnea, PND, swelling in lower extremities, anasarca, dizziness, palpitations, syncope.   GI  No heartburn, indigestion, abdominal pain, nausea, vomiting, diarrhea, change in bowel  habits, loss of appetite, bloody stools.   Resp: No shortness of breath with exertion or at rest.  No excess mucus, no productive cough,  No non-productive cough,  No coughing up of blood.  No change in color of mucus.  No wheezing.  No chest wall deformity  Skin: no rash or lesions.  GU: no dysuria, change in color of urine, no urgency or frequency.  No flank pain, no hematuria   MS:  No joint pain or swelling.  No decreased range of motion.  No back  pain.    Physical Exam  BP 118/80 (BP Location: Left Arm, Patient Position: Sitting, Cuff Size: Normal)    Pulse 83    Temp 97.6 F (36.4 C) (Oral)    Ht _0  (1.626 m)    Wt 194 lb (88 kg)    BMI 33.30 kg/m   GEN: A/Ox3; pleasant , NAD, well nourished    HEENT:  [place]/AT,   NOSE-clear, THROAT-clear, no lesions, no postnasal drip or exudate noted. Class 2-3 MP airway   NECK:  Supple w/ fair ROM; no JVD; normal carotid impulses w/o bruits; no thyromegaly or nodules palpated; no lymphadenopathy.    RESP  Clear  P & A; w/o, wheezes/ rales/ or rhonchi. no accessory muscle use, no dullness to percussion  CARD:  RRR, no m/r/g, no peripheral edema, pulses intact, no cyanosis or clubbing.  GI:   Soft & nt; nml bowel sounds; no organomegaly or masses detected.   Musco: Warm bil, no deformities or joint swelling noted.   Neuro: alert, no focal deficits noted.    Skin: Warm, no lesions or rashes    Lab Results:  CBC   BMET   BNP No results found for: BNP  ProBNP No results found for: PROBNP  Imaging: No results found.    No flowsheet data found.  No results found for: NITRICOXIDE      Assessment & Plan:   OSA (obstructive sleep apnea) Encouraged on BIPAP compliance . Will adjust BIPAP pressures for comfort  - discussed how weight can impact sleep and risk for sleep disordered breathing - discussed options to assist with weight loss: combination of diet modification, cardiovascular and strength training  exercises   - had an extensive discussion regarding the adverse health consequences related to untreated sleep disordered breathing - specifically discussed the risks for hypertension, coronary artery disease, cardiac dysrhythmias, cerebrovascular disease, and diabetes - lifestyle modification discussed   - discussed how sleep disruption can increase risk of accidents, particularly when driving - safe driving practices were discussed    Plan    Patient Instructions  Decrease BiPAP-BiPAP auto BiPAP IPAP max 18, EPAP minimum 10 cm H2O. Try to wear your BiPAP all night long Work on healthy weight Do not drive if sleepy Follow-up in 3 months with Dr. Mortimer Fries and  As needed         Rexene Edison, NP 07/11/2021

## 2021-07-11 NOTE — Assessment & Plan Note (Signed)
I encouraged the patient to start vitamin D 1000 international units once daily over-the-counter.

## 2021-07-11 NOTE — Assessment & Plan Note (Signed)
Likely a viral illness.  She is already improving.  Discussed there would be no role for COVID testing given that she has been improving.  Advised to wear a mask for the next 3 to 4 days when she is around anybody else.  She will let us know if she does not continue to improve.

## 2021-07-11 NOTE — Assessment & Plan Note (Signed)
Encouraged on BIPAP compliance . Will adjust BIPAP pressures for comfort  - discussed how weight can impact sleep and risk for sleep disordered breathing - discussed options to assist with weight loss: combination of diet modification, cardiovascular and strength training exercises   - had an extensive discussion regarding the adverse health consequences related to untreated sleep disordered breathing - specifically discussed the risks for hypertension, coronary artery disease, cardiac dysrhythmias, cerebrovascular disease, and diabetes - lifestyle modification discussed   - discussed how sleep disruption can increase risk of accidents, particularly when driving - safe driving practices were discussed    Plan    Patient Instructions  Decrease BiPAP-BiPAP auto BiPAP IPAP max 18, EPAP minimum 10 cm H2O. Try to wear your BiPAP all night long Work on healthy weight Do not drive if sleepy Follow-up in 3 months with Dr. Mortimer Fries and  As needed

## 2021-07-11 NOTE — Patient Instructions (Addendum)
Decrease BiPAP-BiPAP auto BiPAP IPAP max 18, EPAP minimum 10 cm H2O. Try to wear your BiPAP all night long Work on healthy weight Do not drive if sleepy Follow-up in 3 months with Dr. Mortimer Fries and  As needed

## 2021-07-11 NOTE — Assessment & Plan Note (Signed)
Long history of this.  Discussed timed voiding is likely the best option for her at this time.  Discussed potential medications though possible side effects could occur.  She would prefer not to add any additional medication at this time.

## 2021-07-11 NOTE — Progress Notes (Signed)
Virtual Visit via video Note  This visit type was conducted due to national recommendations for restrictions regarding the COVID-19 pandemic (e.g. social distancing).  This format is felt to be most appropriate for this patient at this time.  All issues noted in this document were discussed and addressed.  No physical exam was performed (except for noted visual exam findings with Video Visits).   I connected with Dana Obrien today at  2:15 PM EST by a video enabled telemedicine application or telephone and verified that I am speaking with the correct person using two identifiers. Location patient: car Location provider: work Persons participating in the virtual visit: patient, provider  I discussed the limitations, risks, security and privacy concerns of performing an evaluation and management service by telephone and the availability of in person appointments. I also discussed with the patient that there may be a patient responsible charge related to this service. The patient expressed understanding and agreed to proceed.   Reason for visit: Follow-up  HPI: Upper respiratory infection: Patient notes symptoms started a week ago.  She had some mild dyspnea initially.  She notes she has been blowing stuff out of her nose and it has loosened up significantly.  She has no fevers.  She has had some postnasal drip and some mild sore throat.  No taste or smell disturbances.  No COVID exposure.  No flu exposure.  Hypertension: Typically runs less than 150/90 though occasionally shoots up to the 357S systolically.  She takes HCTZ.  No chest pain or shortness of breath.  Anxiety: She has a lot going on at home.  Her anxiety has been.  She has been on Prozac though notes her insurance may not pay for this.  She takes Xanax less than once a day.  No drowsiness with that.  She notes some mild depression though it is better now.  No SI.  Vitamin D deficiency: She has not been on any vitamin  D.  Urge incontinence: This is a chronic ongoing issue.  At times she cannot get to the bathroom quickly enough to urinate and she will leak some urine.  She has a history of a bladder repair per her report and notes they did a test that states she did not have the urge to urinate until her bladder was completely full.  She tries to urinate on a schedule.   ROS: See pertinent positives and negatives per HPI.  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   Cancer [facility]) 2004   right lumpectomy, chemotherapy and radiation Dr. Jeb Levering   CPAP (continuous positive airway pressure) dependence    Depression    GERD (gastroesophageal reflux disease)    Hypertension    Migraine    Personal history of chemotherapy 2004   Personal history of radiation therapy 2004   Personal history of radiation therapy 2019   PONV (postoperative nausea and vomiting)    Shingles    Sleep apnea    uses CPAP, severe OSA   Vitamin D deficiency    IN THE PAST    Past Surgical History:  Procedure Laterality Date   ABDOMINAL HYSTERECTOMY     ANTERIOR AND  POSTERIOR REPAIR     BREAST BIOPSY Right 2004   stereo. 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  3.5 cm maximum diameter, minimal margins 5 mm.  In situ component less than 10%.   BREAST LUMPECTOMY Right 2004   BREAST LUMPECTOMY Left 09/17/2017   Procedure: BREAST EXCISION, SENTINEL NODE BIOPSY;  Surgeon: Robert Bellow, MD;  Location: [facility];  Service: General;  Laterality: Left;   COLONOSCOPY  2015   COLONOSCOPY  WITH PROPOFOL N/A 10/04/2019   Procedure: COLONOSCOPY WITH PROPOFOL;  Surgeon: Robert Bellow, MD;  Location: [facility];  Service: Endoscopy;  Laterality: N/A;   DILATION AND CURETTAGE OF UTERUS     ETHMOIDECTOMY Bilateral 11/05/2016   Procedure: ETHMOIDECTOMY;  Surgeon: Melida Quitter, MD;  Location: [place];  Service: ENT;  Laterality: Bilateral;   LAPAROTOMY     MAXILLARY ANTROSTOMY Bilateral 11/05/2016   Procedure: MAXILLARY ANTROSTOMY;  Surgeon: Melida Quitter, MD;  Location: [place];  Service: ENT;  Laterality: Bilateral;   SINUS ENDO W/FUSION Bilateral 11/05/2016   Procedure: FRONTAL RECESS EXPLORATION;  Surgeon: Melida Quitter, MD;  Location: [place];  Service: ENT;  Laterality: Bilateral;  BILATERAL ENDOSCOPIC SINUS SURGERY WITH FUSION   SINUS EXPLORATION  11/05/2016   SPHENOIDECTOMY Bilateral 11/05/2016   Procedure: SPHENOIDECTOMY;  Surgeon: Melida Quitter, MD;  Location: [facility] OR;  Service: ENT;  Laterality: Bilateral;    Family History  Problem Relation Age of Onset   Heart disease Mother    Hyperlipidemia Mother    Hypertension Mother    Hypertension Father    Osteoarthritis Father    Stroke Father    Alzheimer's disease Sister    Alzheimer's disease Brother    Brain cancer Brother    Healthy Daughter    Healthy Son    Breast cancer Neg Hx     SOCIAL HX: Non-smoker   Current Outpatient Medications:    albuterol (VENTOLIN HFA) 108 (90 Base) MCG/ACT inhaler, Inhale 2 puffs into the lungs every 6 (six) hours as needed for wheezing or shortness of breath., Disp: 8 g, Rfl: 0   ALPRAZolam (XANAX) 0.25 MG tablet, Take 1 tablet (0.25 mg total) by mouth 2 (two) times daily as needed. for anxiety, Disp: 20 tablet, Rfl: 0   benzonatate (TESSALON) 200 MG capsule, Take 1 capsule (200 mg total) by mouth 2 (two) times daily as needed for cough., Disp: 20 capsule, Rfl: 0   celecoxib (CELEBREX) 100 MG capsule, Take 1 capsule (100 mg total) by mouth 2 (two) times daily., Disp: 60 capsule, Rfl: 0    diclofenac Sodium (VOLTAREN) 1 % GEL, Apply 2 g topically 4 (four) times daily as needed., Disp: , Rfl:    EQ ALLERGY RELIEF, CETIRIZINE, 10 MG tablet, Take 1 tablet by mouth once daily, Disp: 90 tablet, Rfl: 0   FLUoxetine HCl 60 MG TABS, Take 1 tablet by mouth daily., Disp: 30 tablet, Rfl: 2   fluticasone (FLONASE) 50 MCG/ACT nasal spray, Place 1 spray into both nostrils daily., Disp: 16 g, Rfl: 0   hydrochlorothiazide (HYDRODIURIL) 25 MG tablet, Take 1 tablet (25 mg total) by mouth daily., Disp: 90 tablet, Rfl: 0   hydrOXYzine (ATARAX/VISTARIL) 10 MG tablet, Take 1 tablet (10 mg total) by mouth 3 (three) times daily as needed for itching., Disp: 90 tablet, Rfl: 1   Multiple Vitamin (  MULTI-VITAMIN) tablet, Take 1 tablet by mouth daily. , Disp: , Rfl:    Omega-3 Fatty Acids (FISH OIL PO), Take by mouth daily., Disp: , Rfl:    ondansetron (ZOFRAN) 4 MG tablet, Take 1 tablet (4 mg total) by mouth every 8 (eight) hours as needed for nausea or vomiting., Disp: 20 tablet, Rfl: 0   pantoprazole (PROTONIX) 40 MG tablet, Take 1 tablet by mouth once daily, Disp: 30 tablet, Rfl: 5   rosuvastatin (CRESTOR) 40 MG tablet, Take 1 tablet (40 mg total) by mouth daily., Disp: 30 tablet, Rfl: 2   senna (SENOKOT) 8.6 MG TABS tablet, Take 1 tablet by mouth at bedtime. , Disp: , Rfl:    SUMAtriptan (IMITREX) 50 MG tablet, Take 1 tablet (50 mg total) by mouth every 2 (two) hours as needed for up to 2 doses for migraine. May repeat in 2 hours if headache persists or recurs. Max dose in 24 hours is two tablets., Disp: 10 tablet, Rfl: 0   traMADol (ULTRAM) 50 MG tablet, Take 1 tablet (50 mg total) by mouth every 6 (six) hours as needed., Disp: 30 tablet, Rfl: 0   traZODone (DESYREL) 50 MG tablet, TAKE 1/2 TO 1 (ONE-HALF TO ONE) TABLET BY MOUTH AT BEDTIME AS NEEDED FOR SLEEP, Disp: 30 tablet, Rfl: 5   tretinoin (RETIN-A) 0.05 % cream, Apply 1 application topically at bedtime., Disp: 45 g, Rfl: 0   trolamine salicylate  (ASPERCREME) 10 % cream, Apply 1 application topically as needed for muscle pain., Disp: , Rfl:   EXAM:  VITALS per patient if applicable:  GENERAL: alert, oriented, appears well and in no acute distress  HEENT: atraumatic, conjunttiva clear, no obvious abnormalities on inspection of external nose and ears  NECK: normal movements of the head and neck  LUNGS: on inspection no signs of respiratory distress, breathing rate appears normal, no obvious gross SOB, gasping or wheezing  CV: no obvious cyanosis  MS: moves all visible extremities without noticeable abnormality  PSYCH/NEURO: pleasant and cooperative, no obvious depression or anxiety, speech and thought processing grossly intact  ASSESSMENT AND PLAN:  Discussed the following assessment and plan:  Problem List Items Addressed This Visit     Anxiety and depression    Continues to have some issues with anxiety.  It seems as though her insurance may not cover the Prozac.  She notes this has been the medicine that is worked best for her and she is trialed multiple SSRIs and Cymbalta in the past.  We will check with her pharmacy to see what the issue might be.  For now she will continue Prozac and as needed Xanax.      Hypertension    Patient continues to have labile blood pressures.  She will continue hydrochlorothiazide 25 mg daily.  Discussed a goal of less than 150/90.  Given labile blood pressures we will get a renal Doppler.      Relevant Orders   US Renal Artery Stenosis   Upper respiratory infection    Likely a viral illness.  She is already improving.  Discussed there would be no role for COVID testing given that she has been improving.  Advised to wear a mask for the next 3 to 4 days when she is around anybody else.  She will let us know if she does not continue to improve.      Urge incontinence    Long history of this.  Discussed timed voiding is likely the best option for her  at this time.  Discussed potential  medications though possible side effects could occur.  She would prefer not to add any additional medication at this time.      Vitamin D deficiency    I encouraged the patient to start vitamin D 1000 international units once daily over-the-counter.       Return in about 3 months (around 10/08/2021).   I discussed the assessment and treatment plan with the patient. The patient was provided an opportunity to ask questions and all were answered. The patient agreed with the plan and demonstrated an understanding of the instructions.   The patient was advised to call back or seek an in-person evaluation if the symptoms worsen or if the condition fails to improve as anticipated.   [name], MD

## 2021-07-11 NOTE — Assessment & Plan Note (Signed)
Continues to have some issues with anxiety.  It seems as though her insurance may not cover the Prozac.  She notes this has been the medicine that is worked best for her and she is trialed multiple SSRIs and Cymbalta in the past.  We will check with her pharmacy to see what the issue might be.  For now she will continue Prozac and as needed Xanax.

## 2021-07-11 NOTE — Assessment & Plan Note (Signed)
Patient continues to have labile blood pressures.  She will continue hydrochlorothiazide 25 mg daily.  Discussed a goal of less than 150/90.  Given labile blood pressures we will get a renal Doppler.

## 2021-07-23 DIAGNOSIS — G4733 Obstructive sleep apnea (adult) (pediatric): Secondary | ICD-10-CM | POA: Diagnosis not present

## 2021-08-07 DIAGNOSIS — G4733 Obstructive sleep apnea (adult) (pediatric): Secondary | ICD-10-CM | POA: Diagnosis not present

## 2021-08-19 ENCOUNTER — Ambulatory Visit: Payer: PPO

## 2021-08-22 ENCOUNTER — Other Ambulatory Visit: Payer: Self-pay | Admitting: Family Medicine

## 2021-09-04 ENCOUNTER — Telehealth: Payer: Self-pay | Admitting: Surgical

## 2021-09-04 NOTE — Telephone Encounter (Signed)
Pt called for an update about bilateral gel injection PA Lurena Joiner was to submit. Please call pt at [phone] ?

## 2021-09-04 NOTE — Telephone Encounter (Signed)
Talked with patient and appt.is scheduled for gel injection. ?

## 2021-09-07 DIAGNOSIS — G4733 Obstructive sleep apnea (adult) (pediatric): Secondary | ICD-10-CM | POA: Diagnosis not present

## 2021-09-12 ENCOUNTER — Telehealth: Payer: Self-pay

## 2021-09-12 NOTE — Telephone Encounter (Signed)
Faxed completed PA form to [facility] [employer] at [phone]. ?PA pending. ?

## 2021-09-23 ENCOUNTER — Encounter: Payer: Self-pay | Admitting: Oncology

## 2021-09-23 ENCOUNTER — Inpatient Hospital Stay: Payer: PPO | Attending: Oncology | Admitting: Oncology

## 2021-09-23 VITALS — BP 113/75 | HR 70 | Temp 97.4°F | Resp 16 | Wt 195.8 lb

## 2021-09-23 DIAGNOSIS — Z808 Family history of malignant neoplasm of other organs or systems: Secondary | ICD-10-CM | POA: Insufficient documentation

## 2021-09-23 DIAGNOSIS — R5383 Other fatigue: Secondary | ICD-10-CM | POA: Diagnosis not present

## 2021-09-23 DIAGNOSIS — Z79899 Other long term (current) drug therapy: Secondary | ICD-10-CM | POA: Insufficient documentation

## 2021-09-23 DIAGNOSIS — Z923 Personal history of irradiation: Secondary | ICD-10-CM | POA: Diagnosis not present

## 2021-09-23 DIAGNOSIS — Z8261 Family history of arthritis: Secondary | ICD-10-CM | POA: Insufficient documentation

## 2021-09-23 DIAGNOSIS — Z8249 Family history of ischemic heart disease and other diseases of the circulatory system: Secondary | ICD-10-CM | POA: Insufficient documentation

## 2021-09-23 DIAGNOSIS — Z823 Family history of stroke: Secondary | ICD-10-CM | POA: Insufficient documentation

## 2021-09-23 DIAGNOSIS — Z08 Encounter for follow-up examination after completed treatment for malignant neoplasm: Secondary | ICD-10-CM

## 2021-09-23 DIAGNOSIS — Z9221 Personal history of antineoplastic chemotherapy: Secondary | ICD-10-CM | POA: Diagnosis not present

## 2021-09-23 DIAGNOSIS — Z818 Family history of other mental and behavioral disorders: Secondary | ICD-10-CM | POA: Diagnosis not present

## 2021-09-23 DIAGNOSIS — C50412 Malignant neoplasm of upper-outer quadrant of left female breast: Secondary | ICD-10-CM | POA: Diagnosis not present

## 2021-09-23 DIAGNOSIS — Z170 Estrogen receptor positive status [ER+]: Secondary | ICD-10-CM | POA: Insufficient documentation

## 2021-09-23 DIAGNOSIS — F419 Anxiety disorder, unspecified: Secondary | ICD-10-CM | POA: Insufficient documentation

## 2021-09-23 DIAGNOSIS — I10 Essential (primary) hypertension: Secondary | ICD-10-CM | POA: Diagnosis not present

## 2021-09-23 DIAGNOSIS — Z853 Personal history of malignant neoplasm of breast: Secondary | ICD-10-CM | POA: Diagnosis not present

## 2021-09-23 DIAGNOSIS — Z8349 Family history of other endocrine, nutritional and metabolic diseases: Secondary | ICD-10-CM | POA: Diagnosis not present

## 2021-09-23 DIAGNOSIS — Z885 Allergy status to narcotic agent status: Secondary | ICD-10-CM | POA: Diagnosis not present

## 2021-09-23 NOTE — Progress Notes (Signed)
? ? ? ?Hematology/Oncology Consult note ?[place]  ?[phone] [phone] ? ?Patient Care Team: ?Leone Haven, MD as PCP - General (Family Medicine)  ? ?Name of the patient: Dana Obrien  ?[id number]  ?[birth year]  ? ?Date of visit: 09/23/21 ? 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  ? ?Chief complaint/ Reason for visit-routine follow-up of breast cancer ? ?Heme/Onc history:  patient is a 76 year old female with a prior history of right breast cancer in 2004 s/p lumpectomy followed by [facility] X4 and XRT. T2N0M0.  She thenn completed 5 years of aromasin until 2009. She could not tolerate arimidex. ?  ?Recent screening mammogram on 08/25/2017 showed a possible mass in her left breast.  This was followed by an ultrasound and a diagnostic left mammogram which showed a 0.7 x 0.5 x 0.7 cm mass in the left breast.  No evidence of left axillary adenopathy. ?  ?Diagnostic core biopsy showed invasive mammary carcinoma, 11 mm, grade 1 with no associated DCIS or lymphovascular invasion.  ER greater than 90% positive, PR greater than 90% positive and HER-2/neu negative  ?  ?Final pathology showed: Invasive mammary carcinoma 13 mm, grade 1 with associated DCIS.  Negative margins.  0 out of 3 lymph nodes were positive for malignancy. ER PR positive and HER-2/neu negative pT1c pN0 ?  ? Oncotype testing showed a recurrence score of 20 and given that the patient is more than 76 years of age she did not require adjuvant chemotherapy ?  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  ?Patient could not undergo MammoSite balloon placement and is therefore receiving standard radiation treatment for 5 weeks.  Aromasin started in August 2019.  She had problems tolerating Aromasin and was switched to letrozole.   Patient stopped all hormone therapy in February 2022 due to poor tolerance ?  ? ?Interval history-patient is doing well for her age and other than chronic bilateral knee pain she denies other complaints at this time ? ?ECOG PS- 2 ?Pain scale- 3 ? ? ?Review of systems- Review of Systems  ?Constitutional:  Positive for malaise/fatigue. Negative for chills, fever and weight loss.  ?HENT:  Negative for congestion, ear discharge and nosebleeds.   ?Eyes:  Negative for blurred vision.  ?Respiratory:  Negative for cough, hemoptysis, sputum production, shortness of breath and wheezing.   ?Cardiovascular:  Negative for chest pain, palpitations, orthopnea and claudication.  ?Gastrointestinal:  Negative for abdominal pain, blood in stool, constipation, diarrhea, heartburn, melena, nausea and vomiting.  ?Genitourinary:  Negative for dysuria, flank pain, frequency, hematuria and urgency.  ?Musculoskeletal:  Positive for joint pain. Negative for back pain and myalgias.  ?Skin:  Negative for rash.  ?Neurological:  Negative for dizziness, tingling, focal weakness, seizures, weakness and headaches.  ?Endo/Heme/Allergies:  Does not bruise/bleed easily.  ?Psychiatric/Behavioral:  Negative for depression and suicidal ideas. The patient does not have insomnia.    ? ? ?Allergies  ?Allergen Reactions  ? Lidocaine Palpitations  ? Codeine Nausea Only and Nausea And Vomiting  ? Morphine Nausea Only  ? Niacin Rash  ? Niacin And Related Rash  ?  ANTIHYPERLIPEMICS  ? ? ? ?Past Medical History:  ?Diagnosis Date  ? Anxiety   ? Arthritis   ? Asthma   ? Breast cancer ([place]) 2004  ? right breast  ? Breast cancer ([place]) 09/08/2017  ?  left breast  ? Breast cancer of upper-outer quadrant of left female breast ([place]) 09/17/2017  ? T1c, N0, ER 90%, PR 90%, 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  ? Cancer [facility]) 2004  ? right lumpectomy, chemotherapy and radiation Dr. Jeb Levering  ? CPAP (continuous positive airway pressure) dependence   ? Depression   ? GERD  (gastroesophageal reflux disease)   ? Hypertension   ? Migraine   ? Personal history of chemotherapy 2004  ? Personal history of radiation therapy 2004  ? Personal history of radiation therapy 2019  ? PONV (postoperative nausea and vomiting)   ? Shingles   ? Sleep apnea   ? uses CPAP, severe OSA  ? Vitamin D deficiency   ? IN THE PAST  ? ? ? ?Past Surgical History:  ?Procedure Laterality Date  ? ABDOMINAL HYSTERECTOMY    ? ANTERIOR AND POSTERIOR REPAIR    ? BREAST BIOPSY Right 2004  ? stereo. infiltrating ductal carcinoma  ? BREAST BIOPSY Left 09/07/2017  ? US guided biopsy/positive- invasaive mammary carcinoma  ? BREAST EXCISIONAL BIOPSY Right 11/29/2002  ? Multifocal infiltrating ductal carcinoma with evidence of LCIS.  3.5 cm maximum diameter, minimal margins 5 mm.  In situ component less than 10%.  ? BREAST LUMPECTOMY Right 2004  ? BREAST LUMPECTOMY Left 09/17/2017  ? Procedure: BREAST EXCISION, SENTINEL NODE BIOPSY;  Surgeon: Robert Bellow, MD;  Location: [facility] ORS;  Service: General;  Laterality: Left;  ? COLONOSCOPY  2015  ? COLONOSCOPY WITH PROPOFOL N/A 10/04/2019  ? Procedure: COLONOSCOPY WITH PROPOFOL;  Surgeon: Robert Bellow, MD;  [facility] END[facility]COPY;  Service: Endoscopy;  Laterality: N/A;  ? DILATION AND CURETTAGE OF UTERUS    ? ETHMOIDECTOMY Bilateral 11/05/2016  ? Procedure: ETHMOIDECTOMY;  Surgeon: Melida Quitter, MD;  Location: [place];  Service: ENT;  Laterality: Bilateral;  ? LAPAROTOMY    ? MAXILLARY ANTROSTOMY Bilateral 11/05/2016  ? Procedure: MAXILLARY ANTROSTOMY;  Surgeon: Melida Quitter, MD;  Location: [place];  Service: ENT;  Laterality: Bilateral;  ? SINUS ENDO W/FUSION Bilateral 11/05/2016  ? Procedure: FRONTAL RECESS EXPLORATION;  Surgeon: Melida Quitter, MD;  Location: [place];  Service: ENT;  Laterality: Bilateral;  BILATERAL ENDOSCOPIC SINUS SURGERY WITH FUSION  ? SINUS EXPLORATION  11/05/2016  ? SPHENOIDECTOMY Bilateral 11/05/2016  ? Procedure: SPHENOIDECTOMY;  Surgeon: Melida Quitter, MD;   Location: [place];  Service: ENT;  Laterality: Bilateral;  ? ? ?Social History  ? ?Socioeconomic History  ? Marital status: Widowed  ?  Spouse name: Not on file  ? Number of children: Not on file  ? Years of education: Not on file  ? Highest education level: Not on file  ?Occupational History  ? Not on file  ?Tobacco Use  ? Smoking status: Never  ? Smokeless tobacco: Never  ?Vaping Use  ? Vaping Use: Never used  ?Substance and Sexual Activity  ? Alcohol use: Yes  ?  Comment: rarely  ? Drug use: No  ? Sexual activity: Not Currently  ?Other Topics Concern  ? Not on file  ?Social History Narrative  ? Daily Caffeine Use:  1-2 coffee  ? Regular Exercise -  NO  ? ?Social Determinants of Health  ? ?Financial Resource Strain: Not on file  ?Food Insecurity: Not on file  ?Transportation Needs: Not on file  ?Physical Activity: Not on file  ?Stress: Not on file  ?Social Connections: Not on file  ?Intimate Partner Violence: Not on file  ? ? ?Family History  ?Problem Relation Age of  Onset  ? Heart disease Mother   ? Hyperlipidemia Mother   ? Hypertension Mother   ? Hypertension Father   ? Osteoarthritis Father   ? Stroke Father   ? Alzheimer's disease Sister   ? Alzheimer's disease Brother   ? Brain cancer Brother   ? Healthy Daughter   ? Healthy Son   ? Breast cancer Neg Hx   ? ? ? ?Current Outpatient Medications:  ?  ALPRAZolam (XANAX) 0.25 MG tablet, Take 1 tablet (0.25 mg total) by mouth 2 (two) times daily as needed. for anxiety, Disp: 20 tablet, Rfl: 0 ?  celecoxib (CELEBREX) 100 MG capsule, Take 1 capsule (100 mg total) by mouth 2 (two) times daily., Disp: 60 capsule, Rfl: 0 ?  diclofenac Sodium (VOLTAREN) 1 % GEL, Apply 2 g topically 4 (four) times daily as needed., Disp: , Rfl:  ?  EQ ALLERGY RELIEF, CETIRIZINE, 10 MG tablet, Take 1 tablet by mouth once daily, Disp: 90 tablet, Rfl: 0 ?  FLUoxetine HCl 60 MG TABS, Take 1 tablet by mouth daily., Disp: 30 tablet, Rfl: 2 ?  fluticasone (FLONASE) 50 MCG/ACT nasal spray, Place 1  spray into both nostrils daily., Disp: 16 g, Rfl: 0 ?  hydrochlorothiazide (HYDRODIURIL) 25 MG tablet, Take 1 tablet (25 mg total) by mouth daily., Disp: 90 tablet, Rfl: 0 ?  Multiple Vitamin (MULTI-VITAMIN) tablet,

## 2021-09-29 ENCOUNTER — Other Ambulatory Visit: Payer: Self-pay

## 2021-09-29 DIAGNOSIS — M1711 Unilateral primary osteoarthritis, right knee: Secondary | ICD-10-CM

## 2021-09-29 DIAGNOSIS — M1712 Unilateral primary osteoarthritis, left knee: Secondary | ICD-10-CM

## 2021-09-30 ENCOUNTER — Other Ambulatory Visit: Payer: Self-pay | Admitting: Family Medicine

## 2021-10-01 ENCOUNTER — Ambulatory Visit: Payer: PPO | Admitting: Surgical

## 2021-10-01 ENCOUNTER — Encounter: Payer: Self-pay | Admitting: Orthopedic Surgery

## 2021-10-01 DIAGNOSIS — M1712 Unilateral primary osteoarthritis, left knee: Secondary | ICD-10-CM | POA: Diagnosis not present

## 2021-10-01 DIAGNOSIS — M170 Bilateral primary osteoarthritis of knee: Secondary | ICD-10-CM | POA: Diagnosis not present

## 2021-10-01 DIAGNOSIS — M1711 Unilateral primary osteoarthritis, right knee: Secondary | ICD-10-CM | POA: Diagnosis not present

## 2021-10-01 MED ORDER — HYALURONAN 88 MG/4ML IX SOSY
88.0000 mg | PREFILLED_SYRINGE | INTRA_ARTICULAR | Status: AC | PRN
  Administered 2021-10-01: 88 mg via INTRA_ARTICULAR

## 2021-10-01 MED ORDER — LIDOCAINE HCL 1 % IJ SOLN
5.0000 mL | INTRAMUSCULAR | Status: AC | PRN
  Administered 2021-10-01: 5 mL

## 2021-10-01 NOTE — Progress Notes (Signed)
? ?  Procedure Note ? ?Patient: Dana Obrien             ?Date of Birth: [birth year]           ?MRN: [id number]             ?Visit Date: 10/01/2021 ? ?Procedures: ?Visit Diagnoses: No diagnosis found. ? ?Large Joint Inj: bilateral knee on 10/01/2021 11:40 AM ?Indications: pain, joint swelling and diagnostic evaluation ?Details: 18 G 1.5 in needle, superolateral approach ? ?Arthrogram: No ? ?Medications (Right): 5 mL lidocaine 1 %; 88 mg Hyaluronan 88 MG/4ML ?Medications (Left): 5 mL lidocaine 1 %; 88 mg Hyaluronan 88 MG/4ML ?Outcome: tolerated well, no immediate complications ?Procedure, treatment alternatives, risks and benefits explained, specific risks discussed. Consent was given by the patient. Immediately prior to procedure a time out was called to verify the correct patient, procedure, equipment, support staff and site/side marked as required. Patient was prepped and draped in the usual sterile fashion.  ? ? ? ? ? ?

## 2021-10-04 ENCOUNTER — Other Ambulatory Visit: Payer: Self-pay | Admitting: Family Medicine

## 2021-10-06 ENCOUNTER — Other Ambulatory Visit: Payer: Self-pay | Admitting: Family Medicine

## 2021-10-06 DIAGNOSIS — H903 Sensorineural hearing loss, bilateral: Secondary | ICD-10-CM | POA: Diagnosis not present

## 2021-10-06 NOTE — Telephone Encounter (Signed)
I sent you one & this is a duplicate. Can you refuse? ?

## 2021-10-07 DIAGNOSIS — G4733 Obstructive sleep apnea (adult) (pediatric): Secondary | ICD-10-CM | POA: Diagnosis not present

## 2021-10-07 MED ORDER — ALPRAZOLAM 0.25 MG PO TABS: 0.2500 mg | ORAL_TABLET | Freq: Two times a day (BID) | ORAL | 0 refills | Status: DC | PRN

## 2021-10-21 ENCOUNTER — Other Ambulatory Visit: Payer: Self-pay | Admitting: Family Medicine

## 2021-10-23 ENCOUNTER — Telehealth: Payer: Self-pay

## 2021-10-23 NOTE — Telephone Encounter (Signed)
Lm for patient to ask if she is using bipap. If so, can she bring SD card.

## 2021-10-24 NOTE — Telephone Encounter (Signed)
Lm x2 for patient.  Will close encounter per office protocol.   

## 2021-10-28 ENCOUNTER — Ambulatory Visit: Payer: PPO | Admitting: Internal Medicine

## 2021-10-31 ENCOUNTER — Other Ambulatory Visit: Payer: Self-pay | Admitting: Family Medicine

## 2021-11-01 ENCOUNTER — Other Ambulatory Visit: Payer: Self-pay | Admitting: Family Medicine

## 2021-11-03 ENCOUNTER — Encounter: Payer: Self-pay | Admitting: Family Medicine

## 2021-11-03 NOTE — Telephone Encounter (Signed)
Can you refuse? This is a duplicate.

## 2021-11-07 DIAGNOSIS — G4733 Obstructive sleep apnea (adult) (pediatric): Secondary | ICD-10-CM | POA: Diagnosis not present

## 2021-11-15 ENCOUNTER — Ambulatory Visit
Admission: EM | Admit: 2021-11-15 | Discharge: 2021-11-15 | Disposition: A | Discharge status: Home / Self Care | Payer: PPO | Attending: Student | Admitting: Student

## 2021-11-15 DIAGNOSIS — R239 Unspecified skin changes: Secondary | ICD-10-CM | POA: Diagnosis not present

## 2021-11-15 MED ORDER — DOXYCYCLINE HYCLATE 100 MG PO CAPS: 100.0000 mg | ORAL_CAPSULE | Freq: Two times a day (BID) | ORAL | 0 refills | Status: AC

## 2021-11-15 NOTE — ED Provider Notes (Signed)
[facility]    CSN: [id number] Arrival date & time: 11/15/21  1557      History   Chief Complaint Chief Complaint  Patient presents with   Rash    HPI Dana Obrien is a 77 y.o. female presenting with bump on the left elbow for 2 to 3 days.  History of MRSA infection in the past per patient.  She states small pustule on the left elbow.  Also with few smaller pustules on her pannus.  Subjective chills, has not taken antipyretic.  Temperature as high as 100 per home thermometer.  No drainage from any of the lesions.  HPI  Past Medical History:  Diagnosis Date   Anxiety    Arthritis    Asthma    Breast cancer ([place]) 2004   right breast   Breast cancer ([place]) 09/08/2017   left breast   Breast cancer of upper-outer quadrant of left female breast ([place]) 09/17/2017   T1c, N0, ER 90%, PR 90%, 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   Cancer [facility]) 2004   right lumpectomy, chemotherapy and radiation Dr. Jeb Levering   CPAP (continuous positive airway pressure) dependence    Depression    GERD (gastroesophageal reflux disease)    Hypertension    Migraine    Personal history of chemotherapy 2004   Personal history of radiation therapy 2004   Personal history of radiation therapy 2019   PONV (postoperative nausea and vomiting)    Shingles    Sleep apnea    uses CPAP, severe OSA   Vitamin D deficiency    IN THE PAST    Patient Active Problem List   Diagnosis Date Noted   Urge incontinence 07/11/2021   Upper respiratory infection 07/11/2021   Right knee pain 11/08/2020   Concussion with no loss of consciousness 08/08/2020   Vitamin D deficiency 08/08/2020   Gastroesophageal reflux disease without esophagitis 03/25/2020   Dermatitis due to plants, including poison ivy, sumac, and oak 02/29/2020   Neuropathy 12/15/2019   Allergic rhinitis 07/05/2019   Brain atrophy ([place]) 05/18/2019   Bilateral hearing loss 08/15/2018   Decreased energy 08/15/2018   Prediabetes 06/02/2018    Arthralgia 06/02/2018   BPPV (benign paroxysmal positional vertigo), right 03/11/2018   Dysfunction of right eustachian tube 03/11/2018   Malignant neoplasm of upper-outer quadrant of left breast in female, estrogen receptor positive ([place]) 09/14/2017   Radiculopathy 08/24/2017   Fatty liver 08/24/2017   Abdominal pain 08/24/2017   Hearing difficulty of both ears 06/25/2017   Bilateral sensorineural hearing loss 10/13/2016   Right ear impacted cerumen 10/13/2016   GERD (gastroesophageal reflux disease) 09/07/2016   Acute cough 07/08/2016   Exertional shortness of breath 01/15/2016   Uterine prolapse 11/14/2015   OSA (obstructive sleep apnea) 11/01/2015   Back muscle spasm 04/30/2015   DDD (degenerative disc disease), lumbar 04/30/2015   Lumbar radiculitis 04/30/2015   Low back pain 01/01/2015   Varicose vein of leg 11/22/2014   Post herpetic neuralgia 03/06/2014   Increased frequency of urination 11/17/2013   Rectocele 11/17/2013   Cystocele 10/25/2013   Skin lesion 10/25/2013   Disorder of skin and subcutaneous tissue 10/25/2013   Midline cystocele 10/25/2013   Obesity, unspecified 10/19/2012   History of breast cancer 02/11/2012   Hyperlipidemia 05/11/2011   Osteopenia 05/11/2011   Disorder of bone and cartilage 05/11/2011   Anxiety and depression 03/04/2011   Hypertension 03/04/2011    Past Surgical History:  Procedure Laterality Date   ABDOMINAL HYSTERECTOMY  ANTERIOR AND POSTERIOR REPAIR     BREAST BIOPSY Right 2004   stereo. 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  3.5 cm maximum diameter, minimal margins 5 mm.  In situ component less than 10%.   BREAST LUMPECTOMY Right 2004   BREAST LUMPECTOMY Left 09/17/2017   Procedure: BREAST EXCISION, SENTINEL NODE BIOPSY;  Surgeon:  Robert Bellow, MD;  Location: [facility] ORS;  Service: General;  Laterality: Left;   COLONOSCOPY  2015   COLONOSCOPY WITH PROPOFOL N/A 10/04/2019   Procedure: COLONOSCOPY WITH PROPOFOL;  Surgeon: Robert Bellow, MD;  Location: [facility] ENDOSCOPY;  Service: Endoscopy;  Laterality: N/A;   DILATION AND CURETTAGE OF UTERUS     ETHMOIDECTOMY Bilateral 11/05/2016   Procedure: ETHMOIDECTOMY;  Surgeon: Melida Quitter, MD;  Location: [place];  Service: ENT;  Laterality: Bilateral;   LAPAROTOMY     MAXILLARY ANTROSTOMY Bilateral 11/05/2016   Procedure: MAXILLARY ANTROSTOMY;  Surgeon: Melida Quitter, MD;  Location: [place];  Service: ENT;  Laterality: Bilateral;   SINUS ENDO W/FUSION Bilateral 11/05/2016   Procedure: FRONTAL RECESS EXPLORATION;  Surgeon: Melida Quitter, MD;  Location: [place];  Service: ENT;  Laterality: Bilateral;  BILATERAL ENDOSCOPIC SINUS SURGERY WITH FUSION   SINUS EXPLORATION  11/05/2016   SPHENOIDECTOMY Bilateral 11/05/2016   Procedure: SPHENOIDECTOMY;  Surgeon: Melida Quitter, MD;  [facility];  Service: ENT;  Laterality: Bilateral;    OB History     Gravida  2   Para      Term      Preterm      AB      Living         SAB      IAB      Ectopic      Multiple      Live Births  2        Obstetric Comments  1st Menstrual Cycle:  15 1st Pregnancy: 31            Home Medications    Prior to Admission medications   Medication Sig Start Date End Date Taking? Authorizing Provider  doxycycline (VIBRAMYCIN) 100 MG capsule Take 1 capsule (100 mg total) by mouth 2 (two) times daily for 7 days. 11/15/21 11/22/21 Yes Hazel Sams, PA-C  albuterol (VENTOLIN HFA) 108 (90 Base) MCG/ACT inhaler Inhale 2 puffs into the lungs every 6 (six) hours as needed for wheezing or shortness of breath. 06/19/20   Leone Haven, MD  ALPRAZolam Duanne Moron) 0.25 MG tablet Take 1 tablet by mouth twice daily as needed for anxiety 10/31/21   Leone Haven, MD  benzonatate (TESSALON) 200 MG  capsule Take 1 capsule (200 mg total) by mouth 2 (two) times daily as needed for cough. Patient not taking: Reported on 09/23/2021 04/18/21   Michela Pitcher, NP  celecoxib (CELEBREX) 100 MG capsule TAKE 1 CAPSULE BY MOUTH TWICE DAILY AS NEEDED FOR MODERATE PAIN 10/22/21   Leone Haven, MD  diclofenac Sodium (VOLTAREN) 1 % GEL Apply 2 g topically 4 (four) times daily as needed.    [provider]  EQ ALLERGY RELIEF, CETIRIZINE, 10 MG tablet Take 1 tablet by mouth once daily 10/01/21   Leone Haven, MD  FLUoxetine HCl 60 MG TABS Take 1 tablet by mouth once daily 10/31/21   Leone Haven,  MD  fluticasone (FLONASE) 50 MCG/ACT nasal spray Place 1 spray into both nostrils daily. 07/02/21   Leone Haven, MD  hydrochlorothiazide (HYDRODIURIL) 25 MG tablet Take 1 tablet (25 mg total) by mouth daily. 07/02/21   Leone Haven, MD  hydrOXYzine (ATARAX/VISTARIL) 10 MG tablet Take 1 tablet (10 mg total) by mouth 3 (three) times daily as needed for itching. Patient not taking: Reported on 09/23/2021 08/16/18   Jodelle Green, FNP  Multiple Vitamin (MULTI-VITAMIN) tablet Take 1 tablet by mouth daily.     [provider]  Omega-3 Fatty Acids (FISH OIL PO) Take by mouth daily.    [provider]  ondansetron (ZOFRAN) 4 MG tablet Take 1 tablet (4 mg total) by mouth every 8 (eight) hours as needed for nausea or vomiting. Patient not taking: Reported on 09/23/2021 05/06/21   Leone Haven, MD  pantoprazole (PROTONIX) 40 MG tablet Take 1 tablet by mouth once daily 02/28/21   Leone Haven, MD  rosuvastatin (CRESTOR) 40 MG tablet Take 1 tablet (40 mg total) by mouth daily. 07/02/21   Leone Haven, MD  senna (SENOKOT) 8.6 MG TABS tablet Take 1 tablet by mouth at bedtime.     [provider]  SUMAtriptan (IMITREX) 50 MG tablet Take 1 tablet (50 mg total) by mouth every 2 (two) hours as needed for up to 2 doses for migraine. May repeat in 2 hours if headache  persists or recurs. Max dose in 24 hours is two tablets. Patient not taking: Reported on 09/23/2021 05/07/21   Leone Haven, MD  traMADol (ULTRAM) 50 MG tablet TAKE 1 TABLET BY MOUTH EVERY 6 HOURS AS NEEDED 10/31/21   Leone Haven, MD  traZODone (DESYREL) 50 MG tablet TAKE 1/2 TO 1 (ONE-HALF TO ONE) TABLET BY MOUTH AT BEDTIME AS NEEDED FOR SLEEP 02/28/21   Leone Haven, MD  tretinoin (RETIN-A) 0.05 % cream Apply 1 application topically at bedtime. 05/31/18   Leone Haven, MD  trolamine salicylate (ASPERCREME) 10 % cream Apply 1 application topically as needed for muscle pain.    [provider]    Family History Family History  Problem Relation Age of Onset   Heart disease Mother    Hyperlipidemia Mother    Hypertension Mother    Hypertension Father    Osteoarthritis Father    Stroke Father    Alzheimer's disease Sister    Alzheimer's disease Brother    Brain cancer Brother    Healthy Daughter    Healthy Son    Breast cancer Neg Hx     Social History Social History   Tobacco Use   Smoking status: Never   Smokeless tobacco: Never  Vaping Use   Vaping Use: Never used  Substance Use Topics   Alcohol use: Yes    Comment: rarely   Drug use: No     Allergies   Lidocaine, Codeine, Morphine, Niacin, and Niacin and related   Review of Systems Review of Systems  Skin:  Positive for rash.  All other systems reviewed and are negative.    Physical Exam Triage Vital Signs ED Triage Vitals  Enc Vitals Group     BP      Pulse      Resp      Temp      Temp src      SpO2      Weight      Height      Head  Circumference      Peak Flow      Pain Score      Pain Loc      Pain Edu?      Excl. in [place]?    No data found.  Updated Vital Signs BP (!) 144/75 (BP Location: Right Arm)   Pulse 100   Temp 99 F (37.2 C) (Oral)   Ht 5' 4"  (1.626 m)   Wt 180 lb (81.6 kg)   SpO2 93%   BMI 30.90 kg/m   Visual Acuity Right Eye Distance:   Left  Eye Distance:   Bilateral Distance:    Right Eye Near:   Left Eye Near:    Bilateral Near:     Physical Exam Vitals reviewed.  Constitutional:      General: She is not in acute distress.    Appearance: Normal appearance. She is not ill-appearing or diaphoretic.  HENT:     Head: Normocephalic and atraumatic.  Cardiovascular:     Rate and Rhythm: Normal rate and regular rhythm.     Heart sounds: Normal heart sounds.  Pulmonary:     Effort: Pulmonary effort is normal.     Breath sounds: Normal breath sounds.  Skin:    General: Skin is warm.     Comments: See image below. Left elbow with small 5 mm pustule without surrounding skin changes.  There is no tenderness or swelling of the elbow joint; range of motion intact and without pain.  There are also few minuscule pustules on the pannus, with no lesions below the pannus or between skin folds.  Neurological:     General: No focal deficit present.     Mental Status: She is alert and oriented to person, place, and time.  Psychiatric:        Mood and Affect: Mood normal.        Behavior: Behavior normal.        Thought Content: Thought content normal.        Judgment: Judgment normal.       UC Treatments / Results  Labs (all labs ordered are listed, but only abnormal results are displayed) Labs Reviewed - No data to display  EKG   Radiology No results found.  Procedures Procedures (including critical care time)  Medications Ordered in UC Medications - No data to display  Initial Impression / Assessment and Plan / UC Course  I have reviewed the triage vital signs and the nursing notes.  Pertinent labs & imaging results that were available during my care of the patient were reviewed by me and considered in my medical decision making (see chart for details).     This patient is a very pleasant 76 y.o. year old female presenting with small pustule L elbow and few satellite lesions on pannus. Afebrile but borderline  tachy. Will manage with doxycycline as below. ED return precautions discussed. Patient verbalizes understanding and agreement.   Final Clinical Impressions(s) / UC Diagnoses   Final diagnoses:  Skin change     Discharge Instructions      -Doxycycline twice daily for 7 days.  Make sure to wear sunscreen while spending time outside while on this medication as it can increase your chance of sunburn. You can take this medication with food if you have a sensitive stomach. -Wash with gentle soap and water only     ED Prescriptions     Medication Sig Dispense Auth. Provider   doxycycline (VIBRAMYCIN) 100 MG capsule Take  1 capsule (100 mg total) by mouth 2 (two) times daily for 7 days. 14 capsule Hazel Sams, PA-C      PDMP not reviewed this encounter.   Hazel Sams, PA-C 11/16/21 0820

## 2021-11-15 NOTE — ED Triage Notes (Signed)
Patient c.o elbow pain for the last 2 days.  Now there is a "bump" on it.  Patent started running a fever today -- 100.0  Patient also has a rash in her lower abdomin area.

## 2021-11-15 NOTE — Discharge Instructions (Addendum)
-  Doxycycline twice daily for 7 days.  Make sure to wear sunscreen while spending time outside while on this medication as it can increase your chance of sunburn. You can take this medication with food if you have a sensitive stomach. -Wash with gentle soap and water only

## 2021-11-17 ENCOUNTER — Telehealth: Payer: Self-pay | Admitting: Family Medicine

## 2021-11-17 NOTE — Telephone Encounter (Signed)
Copied from [place] [phone]. Topic: Medicare AWV >> Nov 17, 2021  1:59 PM Devoria Glassing wrote: Reason for CRM: Called patient to schedule AWV, her phone kept cutting off.  Called patient to schedule Annual Wellness Visit.  Please schedule with Nurse Health Advisor Denisa O'Brien-Blaney, LPN at [facility].  Please call [phone] ask for [facility]

## 2021-11-21 ENCOUNTER — Encounter: Payer: Self-pay | Admitting: Family Medicine

## 2021-11-21 NOTE — Telephone Encounter (Signed)
I agree with the need for an in person evaluation. She should be seen at urgent care.

## 2021-12-01 ENCOUNTER — Other Ambulatory Visit: Payer: Self-pay | Admitting: Family Medicine

## 2021-12-03 MED ORDER — ALPRAZOLAM 0.25 MG PO TABS
0.2500 mg | ORAL_TABLET | Freq: Two times a day (BID) | ORAL | 0 refills | Status: DC | PRN
Start: 2021-12-03 — End: 2021-12-26

## 2021-12-04 ENCOUNTER — Other Ambulatory Visit: Payer: Self-pay | Admitting: Family Medicine

## 2021-12-07 DIAGNOSIS — G4733 Obstructive sleep apnea (adult) (pediatric): Secondary | ICD-10-CM | POA: Diagnosis not present

## 2021-12-09 ENCOUNTER — Ambulatory Visit (INDEPENDENT_AMBULATORY_CARE_PROVIDER_SITE_OTHER): Payer: PPO | Admitting: Family Medicine

## 2021-12-09 ENCOUNTER — Encounter: Payer: Self-pay | Admitting: Family Medicine

## 2021-12-09 VITALS — BP 130/70 | HR 71 | Temp 98.3°F | Ht 64.0 in | Wt 187.6 lb

## 2021-12-09 DIAGNOSIS — Z5181 Encounter for therapeutic drug level monitoring: Secondary | ICD-10-CM

## 2021-12-09 DIAGNOSIS — L089 Local infection of the skin and subcutaneous tissue, unspecified: Secondary | ICD-10-CM | POA: Diagnosis not present

## 2021-12-09 DIAGNOSIS — W57XXXA Bitten or stung by nonvenomous insect and other nonvenomous arthropods, initial encounter: Secondary | ICD-10-CM | POA: Diagnosis not present

## 2021-12-09 DIAGNOSIS — F419 Anxiety disorder, unspecified: Secondary | ICD-10-CM

## 2021-12-09 DIAGNOSIS — S40861A Insect bite (nonvenomous) of right upper arm, initial encounter: Secondary | ICD-10-CM | POA: Diagnosis not present

## 2021-12-09 DIAGNOSIS — F32A Depression, unspecified: Secondary | ICD-10-CM | POA: Diagnosis not present

## 2021-12-09 LAB — BASIC METABOLIC PANEL
BUN: 22 mg/dL (ref 6–23)
CO2: 28 mEq/L (ref 19–32)
Calcium: 9.2 mg/dL (ref 8.4–10.5)
Chloride: 102 mEq/L (ref 96–112)
Creatinine, Ser: 0.58 mg/dL (ref 0.40–1.20)
GFR: 89.21 mL/min (ref >60.00)
Glucose, Bld: 102 mg/dL — ABNORMAL HIGH (ref 70–99)
Potassium: 4.1 mEq/L (ref 3.5–5.1)
Sodium: 137 mEq/L (ref 135–145)

## 2021-12-09 MED ORDER — SULFAMETHOXAZOLE-TRIMETHOPRIM 800-160 MG PO TABS: 1.0000 | ORAL_TABLET | Freq: Two times a day (BID) | ORAL | 0 refills | Status: DC

## 2021-12-09 MED ORDER — FLUOXETINE HCL 60 MG PO TABS
1.0000 | ORAL_TABLET | Freq: Every day | ORAL | 3 refills | Status: DC
Start: 2021-12-09 — End: 2022-01-09

## 2021-12-09 NOTE — Assessment & Plan Note (Signed)
Healing adequately.  Discussed monitoring for any fevers, rash, or any systemic illness symptoms and if those occur she will let us know immediately.

## 2021-12-09 NOTE — Assessment & Plan Note (Signed)
Possibly incompletely treated with the doxycycline.  We will treat her with a course of Bactrim 1 double strength tablet twice daily for 7 days.  If not improving she will let us know.

## 2021-12-09 NOTE — Progress Notes (Signed)
[name], MD Phone: [phone]  SECRET KRISTENSEN is a 76 y.o. female who presents today for follow-up.  Anxiety/depression: Patient notes this has worsened since her last visit.  She has been off of her Prozac for about a week and a half.  She has been taking her Xanax twice daily.  She notes one of her sisters died.  Her niece shot herself and has been undergoing numerous surgeries to repair her injuries.  Her other sister has dementia and has been living with the patient's niece in [place] and has been causing lots of issues for that niece.  The patient's daughter has lots of anxiety and depression and started drinking alcohol and smoking cigarettes.  She has asked her daughter to leave the house as they do live together though her daughter will not leave and she does not want to evict her daughter.  She has been staying at her sister's house so.  She notes there is good news as she does have a new baby granddaughter.  Patient notes at times she feels as though she wants to die though does not have any active suicidal thoughts.  Tick bite: Patient notes this was on her medial upper right arm.  This occurred a week ago.  The tick was very small when it was removed.  She has had no fevers since the tick bite or rash.  Pustule: Patient was seen in urgent care on 11/15/2021 for a pustule on her left elbow.  She was treated with doxycycline.  She notes improvement if she has not had any fevers since then though she does still have some soreness in the presence of the pustule at her elbow.  Social History   Tobacco Use  Smoking Status Never  Smokeless Tobacco Never    Current Outpatient Medications on File Prior to Visit  Medication Sig Dispense Refill   albuterol (VENTOLIN HFA) 108 (90 Base) MCG/ACT inhaler Inhale 2 puffs into the lungs every 6 (six) hours as needed for wheezing or shortness of breath. 8 g 0   ALPRAZolam (XANAX) 0.25 MG tablet Take 1 tablet (0.25 mg total) by mouth 2  (two) times daily as needed. for anxiety 20 tablet 0   celecoxib (CELEBREX) 100 MG capsule TAKE 1 CAPSULE BY MOUTH TWICE DAILY AS NEEDED FOR MODERATE PAIN 60 capsule 0   diclofenac Sodium (VOLTAREN) 1 % GEL Apply 2 g topically 4 (four) times daily as needed.     EQ ALLERGY RELIEF, CETIRIZINE, 10 MG tablet Take 1 tablet by mouth once daily 90 tablet 0   fluticasone (FLONASE) 50 MCG/ACT nasal spray Place 1 spray into both nostrils daily. 16 g 0   hydrochlorothiazide (HYDRODIURIL) 25 MG tablet Take 1 tablet (25 mg total) by mouth daily. 90 tablet 0   Multiple Vitamin (MULTI-VITAMIN) tablet Take 1 tablet by mouth daily.      Omega-3 Fatty Acids (FISH OIL PO) Take by mouth daily.     pantoprazole (PROTONIX) 40 MG tablet Take 1 tablet by mouth once daily 90 tablet 0   rosuvastatin (CRESTOR) 40 MG tablet Take 1 tablet by mouth once daily 30 tablet 0   senna (SENOKOT) 8.6 MG TABS tablet Take 1 tablet by mouth at bedtime.      traMADol (ULTRAM) 50 MG tablet TAKE 1 TABLET BY MOUTH EVERY 6 HOURS AS NEEDED 30 tablet 0   traZODone (DESYREL) 50 MG tablet TAKE 1/2 TO 1 (ONE-HALF TO ONE) TABLET BY MOUTH AT BEDTIME AS NEEDED FOR SLEEP  30 tablet 5   tretinoin (RETIN-A) 0.05 % cream Apply 1 application topically at bedtime. 45 g 0   trolamine salicylate (ASPERCREME) 10 % cream Apply 1 application topically as needed for muscle pain.     No current facility-administered medications on file prior to visit.     ROS see history of present illness  Objective  Physical Exam Vitals:   12/09/21 1030  BP: 130/70  Pulse: 71  Temp: 98.3 F (36.8 C)  SpO2: 94%    BP Readings from Last 3 Encounters:  12/09/21 130/70  11/15/21 (!) 144/75  09/23/21 113/75   Wt Readings from Last 3 Encounters:  12/09/21 187 lb 9.6 oz (85.1 kg)  11/15/21 180 lb (81.6 kg)  09/23/21 195 lb 12.8 oz (88.8 kg)    Physical Exam Constitutional:      General: She is not in acute distress.    Appearance: She is not diaphoretic.   Cardiovascular:     Rate and Rhythm: Normal rate and regular rhythm.     Heart sounds: Normal heart sounds.  Pulmonary:     Effort: Pulmonary effort is normal.     Breath sounds: Normal breath sounds.  Skin:    General: Skin is warm and dry.  Neurological:     Mental Status: She is alert.     Right upper inner arm   Left elbow, mild TTP  Assessment/Plan: Please see individual problem list.  Problem List Items Addressed This Visit     Anxiety and depression - Primary (Chronic)    Significant worsening since her last visit.  She has been off of her Prozac and notes her symptoms have worsened quite a bit since coming off of that.  She has only been off of this for 1.5 weeks and thus we will have her resume her Prozac as I think that will be beneficial for her symptoms.  She will resume Prozac 60 mg daily.  She was advised to seek medical attention in the emergency department if she develops suicidal thoughts.  I will see her back in 4 weeks to see how she is doing.  If she has any worsening symptoms prior to then she will let us know.      Relevant Medications   FLUoxetine HCl 60 MG TABS   Pustule    Possibly incompletely treated with the doxycycline.  We will treat her with a course of Bactrim 1 double strength tablet twice daily for 7 days.  If not improving she will let us know.      Relevant Medications   sulfamethoxazole-trimethoprim (BACTRIM DS) 800-160 MG tablet   Tick bite of right upper arm    Healing adequately.  Discussed monitoring for any fevers, rash, or any systemic illness symptoms and if those occur she will let us know immediately.      Other Visit Diagnoses     Medication monitoring encounter       Relevant Orders   Basic Metabolic Panel (BMET)       Return in about 4 weeks (around 01/06/2022) for Anxiety/depression.   [name], MD [place]

## 2021-12-09 NOTE — Assessment & Plan Note (Addendum)
Significant worsening since her last visit.  She has been off of her Prozac and notes her symptoms have worsened quite a bit since coming off of that.  She has only been off of this for 1.5 weeks and thus we will have her resume her Prozac as I think that will be beneficial for her symptoms.  She will resume Prozac 60 mg daily.  She was advised to seek medical attention in the emergency department if she develops suicidal thoughts.  I will see her back in 4 weeks to see how she is doing.  If she has any worsening symptoms prior to then she will let us know.

## 2021-12-09 NOTE — Patient Instructions (Signed)
Nice to see you. We will treat the likely infection at your elbow with Bactrim.  If its not improving please let me know. If you develop any fevers, rash, or any feelings of being ill please let us know right away. I refilled your Prozac.  Please restart this as soon as possible.  If your depression or anxiety worsens please let us know.  If you have any thoughts of harming yourself please go to the emergency department.

## 2021-12-23 ENCOUNTER — Ambulatory Visit
Admission: RE | Admit: 2021-12-23 | Discharge: 2021-12-23 | Disposition: A | Discharge status: Home / Self Care | Payer: PPO | Source: Ambulatory Visit | Attending: Oncology | Admitting: Oncology

## 2021-12-23 DIAGNOSIS — Z1231 Encounter for screening mammogram for malignant neoplasm of breast: Secondary | ICD-10-CM | POA: Insufficient documentation

## 2021-12-23 DIAGNOSIS — Z853 Personal history of malignant neoplasm of breast: Secondary | ICD-10-CM | POA: Diagnosis not present

## 2021-12-23 DIAGNOSIS — Z08 Encounter for follow-up examination after completed treatment for malignant neoplasm: Secondary | ICD-10-CM | POA: Diagnosis not present

## 2021-12-25 ENCOUNTER — Other Ambulatory Visit: Payer: Self-pay | Admitting: Oncology

## 2021-12-25 ENCOUNTER — Telehealth: Payer: Self-pay

## 2021-12-25 DIAGNOSIS — R928 Other abnormal and inconclusive findings on diagnostic imaging of breast: Secondary | ICD-10-CM

## 2021-12-25 DIAGNOSIS — N6489 Other specified disorders of breast: Secondary | ICD-10-CM

## 2021-12-25 DIAGNOSIS — R921 Mammographic calcification found on diagnostic imaging of breast: Secondary | ICD-10-CM

## 2021-12-25 NOTE — Telephone Encounter (Signed)
A PA was done for Fluoxetine and it was approved from 12/16/2021-05/31/2022. Pharmacy was notified. Dalary Hollar,cma

## 2021-12-26 ENCOUNTER — Other Ambulatory Visit: Payer: Self-pay | Admitting: Family Medicine

## 2021-12-26 MED ORDER — DICLOFENAC SODIUM 1 % EX GEL: 2.0000 g | Freq: Four times a day (QID) | CUTANEOUS | 0 refills | Status: DC | PRN

## 2021-12-26 MED ORDER — ALPRAZOLAM 0.25 MG PO TABS
0.2500 mg | ORAL_TABLET | Freq: Two times a day (BID) | ORAL | 0 refills | Status: DC | PRN
Start: 2021-12-26 — End: 2022-01-19

## 2022-01-02 ENCOUNTER — Telehealth: Payer: Self-pay

## 2022-01-02 ENCOUNTER — Ambulatory Visit: Payer: PPO

## 2022-01-02 NOTE — Telephone Encounter (Signed)
Unavailable when called for scheduled AWV. No answer. Left message okay to reschedule.

## 2022-01-07 DIAGNOSIS — G4733 Obstructive sleep apnea (adult) (pediatric): Secondary | ICD-10-CM | POA: Diagnosis not present

## 2022-01-09 ENCOUNTER — Encounter: Payer: Self-pay | Admitting: Family Medicine

## 2022-01-09 ENCOUNTER — Ambulatory Visit (INDEPENDENT_AMBULATORY_CARE_PROVIDER_SITE_OTHER): Payer: PPO | Admitting: Family Medicine

## 2022-01-09 DIAGNOSIS — L089 Local infection of the skin and subcutaneous tissue, unspecified: Secondary | ICD-10-CM | POA: Diagnosis not present

## 2022-01-09 DIAGNOSIS — F419 Anxiety disorder, unspecified: Secondary | ICD-10-CM | POA: Diagnosis not present

## 2022-01-09 DIAGNOSIS — F32A Depression, unspecified: Secondary | ICD-10-CM

## 2022-01-09 MED ORDER — FLUOXETINE HCL 60 MG PO TABS: 1.0000 | ORAL_TABLET | Freq: Every day | ORAL | 11 refills | Status: DC

## 2022-01-09 NOTE — Patient Instructions (Signed)
Nice to see you. Please continue the Prozac 60 mg once daily.  If you have any worsening symptoms please let me know.

## 2022-01-09 NOTE — Progress Notes (Signed)
Dana Rumps, MD Phone: [phone]  Dana Obrien is a 76 y.o. female who presents today for f/u.  Anxiety/depression: Patient notes this has improved some with restarting her Prozac.  She notes she is about to run out and she got a letter from her insurance that they were going to cover the Prozac.  She continues to take Xanax periodically for her anxiety.  No SI.  The previous boils that she had have healed up though remains somewhat sore in the area where they have healed.  Social History   Tobacco Use  Smoking Status Never  Smokeless Tobacco Never    Current Outpatient Medications on File Prior to Visit  Medication Sig Dispense Refill   albuterol (VENTOLIN HFA) 108 (90 Base) MCG/ACT inhaler Inhale 2 puffs into the lungs every 6 (six) hours as needed for wheezing or shortness of breath. 8 g 0   ALPRAZolam (XANAX) 0.25 MG tablet Take 1 tablet (0.25 mg total) by mouth 2 (two) times daily as needed. for anxiety 20 tablet 0   celecoxib (CELEBREX) 100 MG capsule TAKE 1 CAPSULE BY MOUTH TWICE DAILY AS NEEDED FOR MODERATE PAIN 60 capsule 0   diclofenac Sodium (VOLTAREN) 1 % GEL Apply 2 g topically 4 (four) times daily as needed. 50 g 0   EQ ALLERGY RELIEF, CETIRIZINE, 10 MG tablet Take 1 tablet by mouth once daily 90 tablet 0   fluticasone (FLONASE) 50 MCG/ACT nasal spray Place 1 spray into both nostrils daily. 16 g 0   hydrochlorothiazide (HYDRODIURIL) 25 MG tablet Take 1 tablet (25 mg total) by mouth daily. 90 tablet 0   Multiple Vitamin (MULTI-VITAMIN) tablet Take 1 tablet by mouth daily.      Omega-3 Fatty Acids (FISH OIL PO) Take by mouth daily.     pantoprazole (PROTONIX) 40 MG tablet Take 1 tablet by mouth once daily 90 tablet 0   rosuvastatin (CRESTOR) 40 MG tablet Take 1 tablet by mouth once daily 30 tablet 0   senna (SENOKOT) 8.6 MG TABS tablet Take 1 tablet by mouth at bedtime.      sulfamethoxazole-trimethoprim (BACTRIM DS) 800-160 MG tablet Take 1 tablet by mouth 2  (two) times daily. 14 tablet 0   traMADol (ULTRAM) 50 MG tablet TAKE 1 TABLET BY MOUTH EVERY 6 HOURS AS NEEDED 30 tablet 0   traZODone (DESYREL) 50 MG tablet TAKE 1/2 TO 1 (ONE-HALF TO ONE) TABLET BY MOUTH AT BEDTIME AS NEEDED FOR SLEEP 30 tablet 5   tretinoin (RETIN-A) 0.05 % cream Apply 1 application topically at bedtime. 45 g 0   trolamine salicylate (ASPERCREME) 10 % cream Apply 1 application topically as needed for muscle pain.     No current facility-administered medications on file prior to visit.     ROS see history of present illness  Objective  Physical Exam Vitals:   01/09/22 1401  BP: 125/80  Pulse: 73  Temp: 98.1 F (36.7 C)  SpO2: 97%    BP Readings from Last 3 Encounters:  01/09/22 125/80  12/09/21 130/70  11/15/21 (!) 144/75   Wt Readings from Last 3 Encounters:  01/09/22 186 lb 12.8 oz (84.7 kg)  12/09/21 187 lb 9.6 oz (85.1 kg)  11/15/21 180 lb (81.6 kg)    Physical Exam Constitutional:      General: She is not in acute distress.    Appearance: She is not diaphoretic.  Cardiovascular:     Rate and Rhythm: Normal rate and regular rhythm.     Heart  sounds: Normal heart sounds.  Pulmonary:     Effort: Pulmonary effort is normal.  Musculoskeletal:     Comments: Well-healed sores on her left elbow with minimal tenderness, no surrounding erythema  Skin:    General: Skin is warm and dry.  Neurological:     Mental Status: She is alert.      Assessment/Plan: Please see individual problem list.  Problem List Items Addressed This Visit     Anxiety and depression (Chronic)    Improving with restarting Prozac.  She will continue Prozac 60 mg once daily.  She can continue Xanax 0.25 mg twice daily as needed for anxiety.  She notes this is a somewhat short-term medication to use and hopefully she will require this when she is consistently back on the Prozac for a long enough period of time.      Relevant Medications   FLUoxetine HCl 60 MG TABS    Pustule    Resolved.  Mild soreness.  She will monitor.       Return in about 3 months (around 04/11/2022) for Anxiety/depression.   Dana Rumps, MD [place]

## 2022-01-09 NOTE — Assessment & Plan Note (Signed)
Improving with restarting Prozac.  She will continue Prozac 60 mg once daily.  She can continue Xanax 0.25 mg twice daily as needed for anxiety.  She notes this is a somewhat short-term medication to use and hopefully she will require this when she is consistently back on the Prozac for a long enough period of time.

## 2022-01-09 NOTE — Assessment & Plan Note (Signed)
Resolved.  Mild soreness.  She will monitor.

## 2022-01-14 ENCOUNTER — Ambulatory Visit
Admission: RE | Admit: 2022-01-14 | Discharge: 2022-01-14 | Disposition: A | Discharge status: Home / Self Care | Payer: PPO | Source: Ambulatory Visit | Attending: Oncology | Admitting: Oncology

## 2022-01-14 DIAGNOSIS — R928 Other abnormal and inconclusive findings on diagnostic imaging of breast: Secondary | ICD-10-CM | POA: Diagnosis not present

## 2022-01-14 DIAGNOSIS — N6489 Other specified disorders of breast: Secondary | ICD-10-CM | POA: Insufficient documentation

## 2022-01-14 DIAGNOSIS — R921 Mammographic calcification found on diagnostic imaging of breast: Secondary | ICD-10-CM | POA: Diagnosis not present

## 2022-01-14 DIAGNOSIS — Z853 Personal history of malignant neoplasm of breast: Secondary | ICD-10-CM | POA: Diagnosis not present

## 2022-01-19 ENCOUNTER — Telehealth: Payer: Self-pay | Admitting: Family Medicine

## 2022-01-19 MED ORDER — ALPRAZOLAM 0.25 MG PO TABS: 0.2500 mg | ORAL_TABLET | Freq: Two times a day (BID) | ORAL | 0 refills | Status: DC | PRN

## 2022-01-19 MED ORDER — DICLOFENAC SODIUM 1 % EX GEL
2.0000 g | Freq: Four times a day (QID) | CUTANEOUS | 0 refills | Status: DC | PRN
Start: 2022-01-19 — End: 2022-01-21

## 2022-01-19 MED ORDER — TRAMADOL HCL 50 MG PO TABS: 50.0000 mg | ORAL_TABLET | Freq: Four times a day (QID) | ORAL | 0 refills | Status: DC | PRN

## 2022-01-19 MED ORDER — CELECOXIB 100 MG PO CAPS: 100.0000 mg | ORAL_CAPSULE | Freq: Two times a day (BID) | ORAL | 0 refills | Status: DC

## 2022-01-21 MED ORDER — DICLOFENAC SODIUM 1 % EX GEL: 2.0000 g | Freq: Four times a day (QID) | CUTANEOUS | 0 refills | Status: DC | PRN

## 2022-01-21 NOTE — Addendum Note (Signed)
Addended by: Leone Haven on: 01/21/2022 10:58 AM   Modules accepted: Orders

## 2022-01-21 NOTE — Telephone Encounter (Signed)
I'll send the voltaren again.

## 2022-01-21 NOTE — Telephone Encounter (Signed)
Patient picked up medication, She did not receive the diclofenac Sodium (VOLTAREN) 1 % GEL. Her pharmacy told her that her provider did not send a prescription for that. Looks like we tried to send to pharmacy and they did receive request.

## 2022-01-22 ENCOUNTER — Encounter: Payer: Self-pay | Admitting: Orthopedic Surgery

## 2022-01-22 ENCOUNTER — Ambulatory Visit: Payer: PPO | Admitting: Surgical

## 2022-01-22 DIAGNOSIS — M1712 Unilateral primary osteoarthritis, left knee: Secondary | ICD-10-CM

## 2022-01-22 DIAGNOSIS — M1711 Unilateral primary osteoarthritis, right knee: Secondary | ICD-10-CM

## 2022-01-22 MED ORDER — METHYLPREDNISOLONE ACETATE 40 MG/ML IJ SUSP
40.0000 mg | INTRAMUSCULAR | Status: AC | PRN
  Administered 2022-01-22: 40 mg via INTRA_ARTICULAR

## 2022-01-22 MED ORDER — BUPIVACAINE HCL 0.25 % IJ SOLN
4.0000 mL | INTRAMUSCULAR | Status: AC | PRN
  Administered 2022-01-22: 4 mL via INTRA_ARTICULAR

## 2022-01-22 MED ORDER — LIDOCAINE HCL 1 % IJ SOLN
5.0000 mL | INTRAMUSCULAR | Status: AC | PRN
  Administered 2022-01-22: 5 mL

## 2022-01-22 NOTE — Progress Notes (Signed)
Office Visit Note   Patient: Dana Obrien           Date of Birth: July 07, 1945           MRN: [id number] Visit Date: 01/22/2022 Requested by: Leone Haven, MD [address] [place],  [place] [address] PCP: Leone Haven, MD  Subjective: Chief Complaint  Patient presents with   Left Knee - Pain    HPI: Dana Obrien is a 76 y.o. female who presents to the office complaining of left knee pain.  Patient has history of left knee osteoarthritis.  Had bilateral knee gel injections on 10/01/2021 that gave her good relief for her right knee pain but not any significant relief for the left knee more than 1 week.  Notes occasional giving way of the left knee.  No mechanical symptoms, groin pain.  No new injuries or falls.  She would like to try cortisone injection today which has given her good relief in the past..                ROS: All systems reviewed are negative as they relate to the chief complaint within the history of present illness.  Patient denies fevers or chills.  Assessment & Plan: Visit Diagnoses:  1. Unilateral primary osteoarthritis, left knee   2. Unilateral primary osteoarthritis, right knee     Plan: Patient is a 76 year old female who returns for reevaluation of knee pain.  Right knee doing well from previous gel injection back in May.  Left knee still causing some discomfort and did not really get much relief from the gel injection.  She would like to try a left knee cortisone injection.  This was successfully administered after aspiration of 16 cc of inflammatory nonpurulent synovial fluid.  Tolerated injection well.  Plan to preapproved patient for bilateral knee gel injections and follow-up 6 months from her last gel injection (around April 03, 2022) for administration of injections.  This patient is diagnosed with osteoarthritis of the knee(s).    Radiographs show evidence of joint space narrowing, osteophytes, subchondral sclerosis  and/or subchondral cysts.  This patient has knee pain which interferes with functional and activities of daily living.    This patient has experienced inadequate response, adverse effects and/or intolerance with conservative treatments such as acetaminophen, NSAIDS, topical creams, physical therapy or regular exercise, knee bracing and/or weight loss.   This patient has experienced inadequate response or has a contraindication to intra articular steroid injections for at least 3 months.   This patient is not scheduled to have a total knee replacement within 6 months of starting treatment with viscosupplementation.   Follow-Up Instructions: No follow-ups on file.   Orders:  No orders of the defined types were placed in this encounter.  No orders of the defined types were placed in this encounter.     Procedures: Large Joint Inj: L knee on 01/22/2022 5:49 PM Indications: diagnostic evaluation, joint swelling and pain Details: 18 G 1.5 in needle, superolateral approach  Arthrogram: No  Medications: 5 mL lidocaine 1 %; 40 mg methylPREDNISolone acetate 40 MG/ML; 4 mL bupivacaine 0.25 % Aspirate: 16 mL Outcome: tolerated well, no immediate complications Procedure, treatment alternatives, risks and benefits explained, specific risks discussed. Consent was given by the patient. Immediately prior to procedure a time out was called to verify the correct patient, procedure, equipment, support staff and site/side marked as required. Patient was prepped and draped in the usual sterile fashion.  Clinical Data: No additional findings.  Objective: Vital Signs: There were no vitals taken for this visit.  Physical Exam:  Constitutional: Patient appears well-developed HEENT:  Head: Normocephalic Eyes:EOM are normal Neck: Normal range of motion Cardiovascular: Normal rate Pulmonary/chest: Effort normal Neurologic: Patient is alert Skin: Skin is warm Psychiatric: Patient has normal  mood and affect  Ortho Exam: Ortho exam demonstrates left knee with positive effusion.  Tenderness over the medial joint line moderately and lateral joint line mildly.  No calf tenderness.  Negative Homans' sign.  No pain with hip range of motion.  Able to perform straight leg raise without extensor lag.  Stable to anterior posterior drawer.  Specialty Comments:  No specialty comments available.  Imaging: No results found.   PMFS History: Patient Active Problem List   Diagnosis Date Noted   Pustule 12/09/2021   Tick bite of right upper arm 12/09/2021   Urge incontinence 07/11/2021   Upper respiratory infection 07/11/2021   Right knee pain 11/08/2020   Concussion with no loss of consciousness 08/08/2020   Vitamin D deficiency 08/08/2020   Gastroesophageal reflux disease without esophagitis 03/25/2020   Dermatitis due to plants, including poison ivy, sumac, and oak 02/29/2020   Neuropathy 12/15/2019   Allergic rhinitis 07/05/2019   Brain atrophy ([place]) 05/18/2019   Bilateral hearing loss 08/15/2018   Decreased energy 08/15/2018   Prediabetes 06/02/2018   Arthralgia 06/02/2018   BPPV (benign paroxysmal positional vertigo), right 03/11/2018   Dysfunction of right eustachian tube 03/11/2018   Malignant neoplasm of upper-outer quadrant of left breast in female, estrogen receptor positive ([place]) 09/14/2017   Radiculopathy 08/24/2017   Fatty liver 08/24/2017   Hearing difficulty of both ears 06/25/2017   Bilateral sensorineural hearing loss 10/13/2016   Right ear impacted cerumen 10/13/2016   GERD (gastroesophageal reflux disease) 09/07/2016   Acute cough 07/08/2016   Exertional shortness of breath 01/15/2016   Uterine prolapse 11/14/2015   OSA (obstructive sleep apnea) 11/01/2015   Back muscle spasm 04/30/2015   DDD (degenerative disc disease), lumbar 04/30/2015   Lumbar radiculitis 04/30/2015   Low back pain 01/01/2015   Varicose vein of leg 11/22/2014   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   Increased frequency of urination 11/17/2013   Rectocele 11/17/2013   Cystocele 10/25/2013   Skin lesion 10/25/2013   Disorder of skin and subcutaneous tissue 10/25/2013   Midline cystocele 10/25/2013   Obesity, unspecified 10/19/2012   History of breast cancer 02/11/2012   Hyperlipidemia 05/11/2011   Osteopenia 05/11/2011   Disorder of bone and cartilage 05/11/2011   Anxiety and depression 03/04/2011   Hypertension 03/04/2011   Past Medical History:  Diagnosis Date   Anxiety    Arthritis    Asthma    Breast cancer ([place]) 2004   right breast   Breast cancer ([place]) 09/08/2017   left breast   Breast cancer of upper-outer quadrant of left female breast ([place]) 09/17/2017   T1c, N0, ER 90%, PR 90%, 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   Cancer [facility]) 2004   right lumpectomy, chemotherapy and radiation Dr. Jeb Levering   CPAP (continuous positive airway pressure) dependence    Depression    GERD (gastroesophageal reflux disease)    Hypertension    Migraine    Personal history of chemotherapy 2004   Personal history of radiation therapy 2004   Personal history of radiation therapy 2019   PONV (postoperative nausea and vomiting)    Shingles    Sleep apnea    uses CPAP, severe OSA  Vitamin D deficiency    IN THE PAST    Family History  Problem Relation Age of Onset   Heart disease Mother    Hyperlipidemia Mother    Hypertension Mother    Hypertension Father    Osteoarthritis Father    Stroke Father    Alzheimer's disease Sister    Alzheimer's disease Brother    Brain cancer Brother    Healthy Daughter    Healthy Son    Breast cancer Neg Hx     Past Surgical History:  Procedure Laterality Date   ABDOMINAL HYSTERECTOMY     ANTERIOR AND POSTERIOR REPAIR     BREAST BIOPSY Right 2004   stereo. 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  3.5 cm maximum diameter, minimal margins 5 mm.  In situ component less than 10%.   BREAST LUMPECTOMY Right 2004   BREAST LUMPECTOMY Left 09/17/2017   Procedure: BREAST EXCISION, SENTINEL NODE BIOPSY;  Surgeon: Robert Bellow, MD;  Location: [facility] ORS;  Service: General;  Laterality: Left;   COLONOSCOPY  2015   COLONOSCOPY WITH PROPOFOL N/A 10/04/2019   Procedure: COLONOSCOPY WITH PROPOFOL;  Surgeon: Robert Bellow, MD;  Location: [facility] ENDOSCOPY;  Service: Endoscopy;  Laterality: N/A;   DILATION AND CURETTAGE OF UTERUS     ETHMOIDECTOMY Bilateral 11/05/2016   Procedure: ETHMOIDECTOMY;  Surgeon: Melida Quitter, MD;  Location: [facility];  Service: ENT;  Laterality: Bilateral;   LAPAROTOMY     MAXILLARY ANTROSTOMY Bilateral 11/05/2016   Procedure: MAXILLARY ANTROSTOMY;  Surgeon: Melida Quitter, MD;  Location: [facility];  Service: ENT;  Laterality: Bilateral;   SINUS ENDO W/FUSION Bilateral 11/05/2016   Procedure: FRONTAL RECESS EXPLORATION;  Surgeon: Melida Quitter, MD;  Location: [facility];  Service: ENT;  Laterality: Bilateral;  BILATERAL ENDOSCOPIC SINUS SURGERY WITH FUSION   SINUS EXPLORATION  11/05/2016   SPHENOIDECTOMY Bilateral 11/05/2016   Procedure: SPHENOIDECTOMY;  Surgeon: Melida Quitter, MD;  [facility];  Service: ENT;  Laterality: Bilateral;   Social History   Occupational History   Not on file  Tobacco Use   Smoking status: Never   Smokeless tobacco: Never  Vaping Use   Vaping Use: Never used  Substance and Sexual Activity   Alcohol use: Yes    Comment: rarely   Drug use: No   Sexual activity: Not Currently

## 2022-01-23 ENCOUNTER — Telehealth: Payer: Self-pay | Admitting: Family Medicine

## 2022-01-23 NOTE — Telephone Encounter (Signed)
Copied from [place] [id number]. Topic: Medicare AWV >> Jan 23, 2022  1:13 PM Devoria Glassing wrote: Reason for CRM: Left message for patient to schedule Annual Wellness Visit.  Please schedule with Nurse Health Advisor Denisa O'Brien-Blaney, LPN at [facility]. This appt can be telephone or office visit.  Please call [phone] ask for [facility]

## 2022-01-26 ENCOUNTER — Telehealth: Payer: Self-pay | Admitting: Family Medicine

## 2022-01-26 DIAGNOSIS — G43809 Other migraine, not intractable, without status migrainosus: Secondary | ICD-10-CM

## 2022-01-26 NOTE — Telephone Encounter (Signed)
Pt need refill on imitrex sent to [address]

## 2022-01-27 NOTE — Telephone Encounter (Signed)
Imitrex is not on her medication list. What is she taking this for?

## 2022-01-27 NOTE — Telephone Encounter (Signed)
Patient returned my call and stated she takes the Imitrex for migraines, in her history she was given the nasal spray for imitrex and she stated she did not like it, the tablets were there and a Liliyana Walker wrote it before.  Halil Rentz,cma

## 2022-01-27 NOTE — Telephone Encounter (Signed)
LVM for patient to call back.   Dana Obrien,cma  

## 2022-01-28 MED ORDER — SUMATRIPTAN SUCCINATE 50 MG PO TABS: 50.0000 mg | ORAL_TABLET | ORAL | 0 refills | Status: DC | PRN

## 2022-01-28 NOTE — Telephone Encounter (Signed)
Imitrex sent to pharmacy.  

## 2022-02-03 ENCOUNTER — Other Ambulatory Visit: Payer: Self-pay | Admitting: Surgical

## 2022-02-03 MED ORDER — DICLOFENAC SODIUM 1 % EX GEL: 2.0000 g | Freq: Four times a day (QID) | CUTANEOUS | 0 refills | Status: DC | PRN

## 2022-02-04 ENCOUNTER — Telehealth: Payer: Self-pay | Admitting: Family Medicine

## 2022-02-04 NOTE — Telephone Encounter (Signed)
Copied from [place] [id number]. Topic: Medicare AWV >> Feb 04, 2022 11:19 AM Devoria Glassing wrote: Reason for CRM: Left message for patient to schedule Annual Wellness Visit.  Please schedule with Nurse Health Advisor Denisa O'Brien-Blaney, LPN at [facility]. This appt can be telephone or office visit.  Please call [phone] ask for [facility]

## 2022-02-05 ENCOUNTER — Telehealth: Payer: Self-pay

## 2022-02-05 NOTE — Telephone Encounter (Signed)
Auth needed for bilat knee gel injections  

## 2022-02-06 ENCOUNTER — Encounter: Payer: Self-pay | Admitting: Family Medicine

## 2022-02-06 NOTE — Telephone Encounter (Signed)
Next available gel injection would need to be after 04/03/2022 due to last gel injection being done on 10/01/2021.  Will submit in late October, 2023.

## 2022-02-07 ENCOUNTER — Other Ambulatory Visit: Payer: Self-pay | Admitting: Family

## 2022-02-07 DIAGNOSIS — G4733 Obstructive sleep apnea (adult) (pediatric): Secondary | ICD-10-CM | POA: Diagnosis not present

## 2022-02-07 MED ORDER — ALPRAZOLAM 0.25 MG PO TABS: 0.2500 mg | ORAL_TABLET | Freq: Two times a day (BID) | ORAL | 0 refills | Status: DC | PRN

## 2022-02-10 ENCOUNTER — Other Ambulatory Visit: Payer: Self-pay

## 2022-02-10 DIAGNOSIS — G43809 Other migraine, not intractable, without status migrainosus: Secondary | ICD-10-CM

## 2022-02-10 MED ORDER — SUMATRIPTAN SUCCINATE 50 MG PO TABS: 50.0000 mg | ORAL_TABLET | ORAL | 0 refills | Status: DC | PRN

## 2022-02-24 ENCOUNTER — Other Ambulatory Visit: Payer: Self-pay | Admitting: Family

## 2022-02-24 ENCOUNTER — Other Ambulatory Visit: Payer: Self-pay | Admitting: Family Medicine

## 2022-03-05 ENCOUNTER — Telehealth: Payer: Self-pay

## 2022-03-05 NOTE — Telephone Encounter (Signed)
Patient states she needs refills on traMADol (ULTRAM) 50 MG tablet,  diclofenac Sodium (VOLTAREN) 1 % GEL, and ALPRAZolam (XANAX) 0.25 MG tablet.  *Patient states her preferred pharmacy is [facility] on [address].

## 2022-03-06 MED ORDER — DICLOFENAC SODIUM 1 % EX GEL
2.0000 g | Freq: Four times a day (QID) | CUTANEOUS | 0 refills | Status: DC | PRN
Start: 2022-03-06 — End: 2022-04-17

## 2022-03-06 MED ORDER — TRAMADOL HCL 50 MG PO TABS: 50.0000 mg | ORAL_TABLET | Freq: Four times a day (QID) | ORAL | 0 refills | Status: DC | PRN

## 2022-03-06 MED ORDER — ALPRAZOLAM 0.25 MG PO TABS: 0.2500 mg | ORAL_TABLET | Freq: Two times a day (BID) | ORAL | 0 refills | Status: DC | PRN

## 2022-03-06 NOTE — Telephone Encounter (Signed)
Sent to pharmacy 

## 2022-03-09 DIAGNOSIS — G4733 Obstructive sleep apnea (adult) (pediatric): Secondary | ICD-10-CM | POA: Diagnosis not present

## 2022-03-10 ENCOUNTER — Telehealth: Payer: Self-pay | Admitting: *Deleted

## 2022-03-10 NOTE — Patient Outreach (Signed)
  Care Coordination   03/10/2022 Name: TEEA DUCEY MRN: [id number] DOB: 10/15/1945   Care Coordination Outreach Attempts:  An unsuccessful telephone outreach was attempted today to offer the patient information about available care coordination services as a benefit of their health plan.   Follow Up Plan:  Additional outreach attempts will be made to offer the patient care coordination information and services.   Encounter Outcome:  No Answer  Care Coordination Interventions Activated:  Yes   Care Coordination Interventions:  No, not indicated    [facility] [phone]

## 2022-03-12 ENCOUNTER — Ambulatory Visit (INDEPENDENT_AMBULATORY_CARE_PROVIDER_SITE_OTHER): Payer: PPO

## 2022-03-12 VITALS — Ht 64.0 in | Wt 186.0 lb

## 2022-03-12 DIAGNOSIS — Z0000 Encounter for general adult medical examination without abnormal findings: Secondary | ICD-10-CM | POA: Diagnosis not present

## 2022-03-12 NOTE — Patient Instructions (Addendum)
Dana Obrien , Thank you for taking time to come for your Medicare Wellness Visit. I appreciate your ongoing commitment to your health goals. Please review the following plan we discussed and let me know if I can assist you in the future.   These are the goals we discussed:  Goals       Patient Stated     Increase physical activity (pt-stated)      Walk for exercise as tolerated        This is a list of the screening recommended for you and due dates:  Health Maintenance  Topic Date Due   COVID-19 Vaccine (3 - Pfizer risk series) 03/28/2022*   Tetanus Vaccine  05/01/2022*   Zoster (Shingles) Vaccine (1 of 2) 06/12/2022*   Flu Shot  08/30/2022*   Mammogram  12/24/2023   Colon Cancer Screening  10/03/2024   Pneumonia Vaccine  Completed   DEXA scan (bone density measurement)  Completed   Hepatitis C Screening: USPSTF Recommendation to screen - Ages 39-79 yo.  Completed   HPV Vaccine  Aged Out  *Topic was postponed. The date shown is not the original due date.    Advanced directives: End of life planning; Advance aging; Advanced directives discussed.  Copy of current HCPOA/Living Will requested.    Conditions/risks identified: none new  Next appointment: Follow up in one year for your annual wellness visit    Preventive Care 65 Years and Older, Female Preventive care refers to lifestyle choices and visits with your health care provider that can promote health and wellness. What does preventive care include? A yearly physical exam. This is also called an annual well check. Dental exams once or twice a year. Routine eye exams. Ask your health care provider how often you should have your eyes checked. Personal lifestyle choices, including: Daily care of your teeth and gums. Regular physical activity. Eating a healthy diet. Avoiding tobacco and drug use. Limiting alcohol use. Practicing safe sex. Taking low-dose aspirin every day. Taking vitamin and mineral supplements as  recommended by your health care provider. What happens during an annual well check? The services and screenings done by your health care provider during your annual well check will depend on your age, overall health, lifestyle risk factors, and family history of disease. Counseling  Your health care provider may ask you questions about your: Alcohol use. Tobacco use. Drug use. Emotional well-being. Home and relationship well-being. Sexual activity. Eating habits. History of falls. Memory and ability to understand (cognition). Work and work Statistician. Reproductive health. Screening  You may have the following tests or measurements: Height, weight, and BMI. Blood pressure. Lipid and cholesterol levels. These may be checked every 5 years, or more frequently if you are over 49 years old. Skin check. Lung cancer screening. You may have this screening every year starting at age 7 if you have a 30-pack-year history of smoking and currently smoke or have quit within the past 15 years. Fecal occult blood test (FOBT) of the stool. You may have this test every year starting at age 66. Flexible sigmoidoscopy or colonoscopy. You may have a sigmoidoscopy every 5 years or a colonoscopy every 10 years starting at age 35. Hepatitis C blood test. Hepatitis B blood test. Sexually transmitted disease (STD) testing. Diabetes screening. This is done by checking your blood sugar (glucose) after you have not eaten for a while (fasting). You may have this done every 1-3 years. Bone density scan. This is done to screen for osteoporosis.  You may have this done starting at age 61. Mammogram. This may be done every 1-2 years. Talk to your health care provider about how often you should have regular mammograms. Talk with your health care provider about your test results, treatment options, and if necessary, the need for more tests. Vaccines  Your health care provider may recommend certain vaccines, such  as: Influenza vaccine. This is recommended every year. Tetanus, diphtheria, and acellular pertussis (Tdap, Td) vaccine. You may need a Td booster every 10 years. Zoster vaccine. You may need this after age 32. Pneumococcal 13-valent conjugate (PCV13) vaccine. One dose is recommended after age 66. Pneumococcal polysaccharide (PPSV23) vaccine. One dose is recommended after age 61. Talk to your health care provider about which screenings and vaccines you need and how often you need them. This information is not intended to replace advice given to you by your health care provider. Make sure you discuss any questions you have with your health care provider. Document Released: 06/14/2015 Document Revised: 02/05/2016 Document Reviewed: 03/19/2015 Elsevier Interactive Patient Education  2017 [place] Prevention in the Home Falls can cause injuries. They can happen to people of all ages. There are many things you can do to make your home safe and to help prevent falls. What can I do on the outside of my home? Regularly fix the edges of walkways and driveways and fix any cracks. Remove anything that might make you trip as you walk through a door, such as a raised step or threshold. Trim any bushes or trees on the path to your home. Use bright outdoor lighting. Clear any walking paths of anything that might make someone trip, such as rocks or tools. Regularly check to see if handrails are loose or broken. Make sure that both sides of any steps have handrails. Any raised decks and porches should have guardrails on the edges. Have any leaves, snow, or ice cleared regularly. Use sand or salt on walking paths during winter. Clean up any spills in your garage right away. This includes oil or grease spills. What can I do in the bathroom? Use night lights. Install grab bars by the toilet and in the tub and shower. Do not use towel bars as grab bars. Use non-skid mats or decals in the tub or  shower. If you need to sit down in the shower, use a plastic, non-slip stool. Keep the floor dry. Clean up any water that spills on the floor as soon as it happens. Remove soap buildup in the tub or shower regularly. Attach bath mats securely with double-sided non-slip rug tape. Do not have throw rugs and other things on the floor that can make you trip. What can I do in the bedroom? Use night lights. Make sure that you have a light by your bed that is easy to reach. Do not use any sheets or blankets that are too big for your bed. They should not hang down onto the floor. Have a firm chair that has side arms. You can use this for support while you get dressed. Do not have throw rugs and other things on the floor that can make you trip. What can I do in the kitchen? Clean up any spills right away. Avoid walking on wet floors. Keep items that you use a lot in easy-to-reach places. If you need to reach something above you, use a strong step stool that has a grab bar. Keep electrical cords out of the way. Do not use  floor polish or wax that makes floors slippery. If you must use wax, use non-skid floor wax. Do not have throw rugs and other things on the floor that can make you trip. What can I do with my stairs? Do not leave any items on the stairs. Make sure that there are handrails on both sides of the stairs and use them. Fix handrails that are broken or loose. Make sure that handrails are as long as the stairways. Check any carpeting to make sure that it is firmly attached to the stairs. Fix any carpet that is loose or worn. Avoid having throw rugs at the top or bottom of the stairs. If you do have throw rugs, attach them to the floor with carpet tape. Make sure that you have a light switch at the top of the stairs and the bottom of the stairs. If you do not have them, ask someone to add them for you. What else can I do to help prevent falls? Wear shoes that: Do not have high heels. Have  rubber bottoms. Are comfortable and fit you well. Are closed at the toe. Do not wear sandals. If you use a stepladder: Make sure that it is fully opened. Do not climb a closed stepladder. Make sure that both sides of the stepladder are locked into place. Ask someone to hold it for you, if possible. Clearly mark and make sure that you can see: Any grab bars or handrails. First and last steps. Where the edge of each step is. Use tools that help you move around (mobility aids) if they are needed. These include: Canes. Walkers. Scooters. Crutches. Turn on the lights when you go into a dark area. Replace any light bulbs as soon as they burn out. Set up your furniture so you have a clear path. Avoid moving your furniture around. If any of your floors are uneven, fix them. If there are any pets around you, be aware of where they are. Review your medicines with your doctor. Some medicines can make you feel dizzy. This can increase your chance of falling. Ask your doctor what other things that you can do to help prevent falls. This information is not intended to replace advice given to you by your health care provider. Make sure you discuss any questions you have with your health care provider. Document Released: 03/14/2009 Document Revised: 10/24/2015 Document Reviewed: 06/22/2014 Elsevier Interactive Patient Education  2017 [place].  Opioid Pain Medicine Management Opioids are powerful medicines that are used to treat moderate to severe pain. When used for short periods of time, they can help you to: Sleep better. Do better in physical or occupational therapy. Feel better in the first few days after an injury. Recover from surgery. Opioids should be taken with the supervision of a trained health care provider. They should be taken for the shortest period of time possible. This is because opioids can be addictive, and the longer you take opioids, the greater your risk of addiction. This  addiction can also be called opioid use disorder. What are the risks? Using opioid pain medicines for longer than 3 days increases your risk of side effects. Side effects include: Constipation. Nausea and vomiting. Breathing difficulties (respiratory depression). Drowsiness. Confusion. Opioid use disorder. Itching. Taking opioid pain medicine for a long period of time can affect your ability to do daily tasks. It also puts you at risk for: Motor vehicle crashes. Depression. Suicide. Heart attack. Overdose, which can be life-threatening. What is a pain  treatment plan? A pain treatment plan is an agreement between you and your health care provider. Pain is unique to each person, and treatments vary depending on your condition. To manage your pain, you and your health care provider need to work together. To help you do this: Discuss the goals of your treatment, including how much pain you might expect to have and how you will manage the pain. Review the risks and benefits of taking opioid medicines. Remember that a good treatment plan uses more than one approach and minimizes the chance of side effects. Be honest about the amount of medicines you take and about any drug or alcohol use. Get pain medicine prescriptions from only one health care provider. Pain can be managed with many types of alternative treatments. Ask your health care provider to refer you to one or more specialists who can help you manage pain through: Physical or occupational therapy. Counseling (cognitive behavioral therapy). Good nutrition. Biofeedback. Massage. Meditation. Non-opioid medicine. Following a gentle exercise program. How to use opioid pain medicine Taking medicine Take your pain medicine exactly as told by your health care provider. Take it only when you need it. If your pain gets less severe, you may take less than your prescribed dose if your health care provider approves. If you are not having  pain, do nottake pain medicine unless your health care provider tells you to take it. If your pain is severe, do nottry to treat it yourself by taking more pills than instructed on your prescription. Contact your health care provider for help. Write down the times when you take your pain medicine. It is easy to become confused while on pain medicine. Writing the time can help you avoid overdose. Take other over-the-counter or prescription medicines only as told by your health care provider. Keeping yourself and others safe  While you are taking opioid pain medicine: Do not drive, use machinery, or power tools. Do not sign legal documents. Do not drink alcohol. Do not take sleeping pills. Do not supervise children by yourself. Do not do activities that require climbing or being in high places. Do not go to a lake, river, ocean, spa, or swimming pool. Do not share your pain medicine with anyone. Keep pain medicine in a locked cabinet or in a secure area where pets and children cannot reach it. Stopping your use of opioids If you have been taking opioid medicine for more than a few weeks, you may need to slowly decrease (taper) how much you take until you stop completely. Tapering your use of opioids can decrease your risk of symptoms of withdrawal, such as: Pain and cramping in the abdomen. Nausea. Sweating. Sleepiness. Restlessness. Uncontrollable shaking (tremors). Cravings for the medicine. Do not attempt to taper your use of opioids on your own. Talk with your health care provider about how to do this. Your health care provider may prescribe a step-down schedule based on how much medicine you are taking and how long you have been taking it. Getting rid of leftover pills Do not save any leftover pills. Get rid of leftover pills safely by: Taking the medicine to a prescription take-back program. This is usually offered by the county or law enforcement. Bringing them to a pharmacy that  has a drug disposal container. Flushing them down the toilet. Check the label or package insert of your medicine to see whether this is safe to do. Throwing them out in the trash. Check the label or package insert of your medicine  to see whether this is safe to do. If it is safe to throw it out, remove the medicine from the original container, put it into a sealable bag or container, and mix it with used coffee grounds, food scraps, dirt, or cat litter before putting it in the trash. Follow these instructions at home: Activity Do exercises as told by your health care provider. Avoid activities that make your pain worse. Return to your normal activities as told by your health care provider. Ask your health care provider what activities are safe for you. General instructions You may need to take these actions to prevent or treat constipation: Drink enough fluid to keep your urine pale yellow. Take over-the-counter or prescription medicines. Eat foods that are high in fiber, such as beans, whole grains, and fresh fruits and vegetables. Limit foods that are high in fat and processed sugars, such as fried or sweet foods. Keep all follow-up visits. This is important. Where to find support If you have been taking opioids for a long time, you may benefit from receiving support for quitting from a local support group or counselor. Ask your health care provider for a referral to these resources in your area. Where to find more information Centers for Disease Control and Prevention (CDC): http://www.wolf.info/ [place] Food and Drug Administration (FDA): [place] Get help right away if: You may have taken too much of an opioid (overdosed). Common symptoms of an overdose: Your breathing is slower or more shallow than normal. You have a very slow heartbeat (pulse). You have slurred speech. You have nausea and vomiting. Your pupils become very small. You have other potential symptoms: You are very confused. You  faint or feel like you will faint. You have cold, clammy skin. You have blue lips or fingernails. You have thoughts of harming yourself or harming others. These symptoms may represent a serious problem that is an emergency. Do not wait to see if the symptoms will go away. Get medical help right away. Call your local emergency services (911 in the [place] Do not drive yourself to the hospital.  If you ever feel like you may hurt yourself or others, or have thoughts about taking your own life, get help right away. Go to your nearest emergency department or: Call your local emergency services (911 in the [place] Call the [facility] 979-336-0802 in the [place] Call a suicide crisis helpline, such as the [place] at 430 393 1393 or 988 in the [place]. This is open 24 hours a day in the [place] Text the Crisis Text Line at (920)640-3385 (in the [place].). Summary Opioid medicines can help you manage moderate to severe pain for a short period of time. A pain treatment plan is an agreement between you and your health care provider. Discuss the goals of your treatment, including how much pain you might expect to have and how you will manage the pain. If you think that you or someone else may have taken too much of an opioid, get medical help right away. This information is not intended to replace advice given to you by your health care provider. Make sure you discuss any questions you have with your health care provider. Document Revised: 12/11/2020 Document Reviewed: 08/28/2020 Elsevier Patient Education  [place].

## 2022-03-12 NOTE — Progress Notes (Signed)
Subjective:   Dana Obrien is a 76 y.o. female who presents for Medicare Annual (Subsequent) preventive examination.  Review of Systems    No ROS.  Medicare Wellness Virtual Visit.  Visual/audio telehealth visit, UTA vital signs.   See social history for additional risk factors.   Cardiac Risk Factors include: advanced age (>66mn, >>53women)     Objective:    Today's Vitals   03/12/22 0827  Weight: 186 lb (84.4 kg)  Height: 5' 4"  (1.626 m)   Body mass index is 31.93 kg/m.     03/12/2022    8:34 AM 09/23/2021   11:14 AM 08/16/2020    9:06 AM 07/16/2020   11:08 AM 07/15/2020   11:14 AM 05/20/2020   11:12 AM 03/28/2020   10:47 AM  Advanced Directives  Does Patient Have a Medical Advance Directive? Yes No No No No No No  [place]        Does patient want to make changes to medical advance directive? No - Patient declined        Copy of [place] Chart? No - copy requested        Would patient like information on creating a medical advance directive?  No - Patient declined No - Patient declined No - Patient declined   No - Patient declined    Current Medications (verified) Outpatient Encounter Medications as of 03/12/2022  Medication Sig   albuterol (VENTOLIN HFA) 108 (90 Base) MCG/ACT inhaler Inhale 2 puffs into the lungs every 6 (six) hours as needed for wheezing or shortness of breath.   ALPRAZolam (XANAX) 0.25 MG tablet Take 1 tablet (0.25 mg total) by mouth 2 (two) times daily as needed. for anxiety   celecoxib (CELEBREX) 100 MG capsule Take 1 capsule by mouth twice daily   diclofenac Sodium (VOLTAREN) 1 % GEL Apply 2 g topically 4 (four) times daily as needed.   EQ ALLERGY RELIEF, CETIRIZINE, 10 MG tablet Take 1 tablet by mouth once daily   FLUoxetine HCl 60 MG TABS Take 1 tablet by mouth daily.   fluticasone (FLONASE) 50 MCG/ACT nasal spray Place 1 spray into both nostrils daily.    hydrochlorothiazide (HYDRODIURIL) 25 MG tablet Take 1 tablet (25 mg total) by mouth daily.   Multiple Vitamin (MULTI-VITAMIN) tablet Take 1 tablet by mouth daily.    Omega-3 Fatty Acids (FISH OIL PO) Take by mouth daily.   pantoprazole (PROTONIX) 40 MG tablet Take 1 tablet by mouth once daily   rosuvastatin (CRESTOR) 40 MG tablet Take 1 tablet by mouth once daily   senna (SENOKOT) 8.6 MG TABS tablet Take 1 tablet by mouth at bedtime.    SUMAtriptan (IMITREX) 50 MG tablet Take 1 tablet (50 mg total) by mouth every 2 (two) hours as needed for migraine. May repeat in 2 hours if headache persists or recurs. No more than 2 doses in a 24 hour time frame.   traMADol (ULTRAM) 50 MG tablet Take 1 tablet (50 mg total) by mouth every 6 (six) hours as needed.   traZODone (DESYREL) 50 MG tablet TAKE 1/2 TO 1 (ONE-HALF TO ONE) TABLET BY MOUTH AT BEDTIME AS NEEDED FOR SLEEP   tretinoin (RETIN-A) 0.05 % cream Apply 1 application topically at bedtime.   No facility-administered encounter medications on file as of 03/12/2022.    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   Cancer [facility]) 2004   right lumpectomy, chemotherapy and radiation Dr. Jeb Levering   CPAP (continuous positive airway pressure) dependence    Depression    GERD (gastroesophageal reflux disease)    Hypertension    Migraine    Personal history of chemotherapy 2004   Personal history of radiation therapy 2004   Personal history of radiation therapy 2019   PONV (postoperative nausea and vomiting)    Shingles    Sleep apnea    uses CPAP, severe OSA   Vitamin D deficiency    IN THE PAST    Past Surgical History:  Procedure Laterality Date   ABDOMINAL HYSTERECTOMY     ANTERIOR AND POSTERIOR REPAIR     BREAST BIOPSY Right 2004   stereo. 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  3.5 cm maximum diameter, minimal margins 5 mm.  In situ component less than 10%.   BREAST LUMPECTOMY Right 2004   BREAST LUMPECTOMY Left 09/17/2017   Procedure: BREAST EXCISION, SENTINEL NODE BIOPSY;  Surgeon: Robert Bellow, MD;  Location: [facility] ORS;  Service: General;  Laterality: Left;   COLONOSCOPY  2015   COLONOSCOPY WITH PROPOFOL N/A 10/04/2019   Procedure: COLONOSCOPY WITH PROPOFOL;  Surgeon: Robert Bellow, MD;  Location: [facility] ENDOSCOPY;  Service: Endoscopy;  Laterality: N/A;   DILATION AND CURETTAGE OF UTERUS     ETHMOIDECTOMY Bilateral 11/05/2016   Procedure: ETHMOIDECTOMY;  Surgeon: Melida Quitter, MD;  Location: [place];  Service: ENT;  Laterality: Bilateral;   LAPAROTOMY     MAXILLARY ANTROSTOMY Bilateral 11/05/2016   Procedure: MAXILLARY ANTROSTOMY;  Surgeon: Melida Quitter, MD;  Location: [place];  Service: ENT;  Laterality: Bilateral;   SINUS ENDO W/FUSION Bilateral 11/05/2016   Procedure: FRONTAL RECESS EXPLORATION;  Surgeon: Melida Quitter, MD;  Location: [place];  Service: ENT;  Laterality: Bilateral;  BILATERAL ENDOSCOPIC SINUS SURGERY WITH FUSION   SINUS EXPLORATION  11/05/2016   SPHENOIDECTOMY Bilateral 11/05/2016   Procedure: SPHENOIDECTOMY;  Surgeon: Melida Quitter, MD;  Location[facility];  Service: ENT;  Laterality: Bilateral;   Family History  Problem Relation Age of Onset   Heart disease Mother    Hyperlipidemia Mother    Hypertension Mother    Hypertension Father    Osteoarthritis Father    Stroke Father    Alzheimer's disease Sister    Alzheimer's disease Brother    Brain cancer Brother     Healthy Daughter    Healthy Son    Breast cancer Neg Hx    Social History   Socioeconomic History   Marital status: Widowed    Spouse name: Not on file   Number of children: Not on file   Years of education: Not on file   Highest education level: Not on file  Occupational History   Not on file  Tobacco Use   Smoking status: Never   Smokeless tobacco: Never  Vaping Use   Vaping Use: Never used  Substance and Sexual Activity   Alcohol use:  Yes    Comment: rarely   Drug use: No   Sexual activity: Not Currently  Other Topics Concern   Not on file  Social History Narrative   Daily Caffeine Use:  1-2 coffee   Regular Exercise -  NO   Social Determinants of Health   Financial Resource Strain: Low Risk  (03/12/2022)   Overall Financial Resource Strain (CARDIA)    Difficulty of Paying Living Expenses: Not hard at all  Food Insecurity: No Food Insecurity (03/12/2022)   Hunger Vital Sign    Worried About Running Out of Food in the Last Year: Never true    Ran Out of Food in the Last Year: Never true  Transportation Needs: No Transportation Needs (03/12/2022)   PRAPARE - Hydrologist (Medical): No    Lack of Transportation (Non-Medical): No  Physical Activity: Insufficiently Active (03/12/2022)   Exercise Vital Sign    Days of Exercise per Week: 2 days    Minutes of Exercise per Session: 20 min  Stress: No Stress Concern Present (03/12/2022)   [place]    Feeling of Stress : Not at all  Social Connections: Unknown (03/12/2022)   Social Connection and Isolation Panel [NHANES]    Frequency of Communication with Friends and Family: More than three times a week    Frequency of Social Gatherings with Friends and Family: More than three times a week    Attends Religious Services: More than 4 times per year    Active Member of [employer] or Organizations: Not on file    Attends Theatre manager Meetings: Not on file    Marital Status: Widowed    Tobacco Counseling Counseling given: Not Answered   Clinical Intake:  Pre-visit preparation completed: Yes        Diabetes: No  How often do you need to have someone help you when you read instructions, pamphlets, or other written materials from your doctor or pharmacy?: 1 - Never    Interpreter Needed?: No    Activities of Daily Living    03/12/2022    8:36 AM  In your present state of health, do you have any difficulty performing the following activities:  Hearing? 1  Comment Hearing aids  Vision? 0  Difficulty concentrating or making decisions? 0  Walking or climbing stairs? 0  Comment Paces self with activity  Dressing or bathing? 0  Doing errands, shopping? 0  Preparing Food and eating ? N  Using the Toilet? N  In the past six months, have you accidently leaked urine? N  Do you have problems with loss of bowel control? N  Managing your Medications? N  Managing your Finances? N  Housekeeping or managing your Housekeeping? N    Patient Care Team: Leone Haven, MD as PCP - General (Family Medicine)  Indicate any recent Medical Services you may have received from other than [facility] providers in the past year (date may be approximate).     Assessment:   This is a routine wellness examination for [place].  I connected with  Dana Obrien on 03/12/22 by a audio enabled telemedicine application and verified that I am speaking with the correct person using two identifiers.  Patient Location: Home  Provider Location: Office/Clinic  I discussed the limitations of evaluation and management by telemedicine. The patient expressed understanding and agreed to proceed.   Hearing/Vision screen Hearing Screening - Comments:: Hearing aid, bilateral  Vision Screening - Comments:: They have seen their ophthalmologist in the last 12 months. Wears readers only  Dietary issues and exercise  activities discussed: Current Exercise Habits: Home exercise routine, Type of exercise: walking, Time (Minutes): 20, Frequency (Times/Week): 2, Weekly Exercise (Minutes/Week): 40, Intensity: Mild Regular diet Good water intake   Goals Addressed               This Visit's Progress     Patient Stated     Increase physical activity (pt-stated)        Walk for exercise as tolerated       Depression Screen    03/12/2022    8:32 AM 12/09/2021   10:49 AM 12/09/2021   10:31 AM 05/06/2021    9:55 AM 08/16/2020    9:08 AM 08/07/2020    2:31 PM 02/23/2020    9:44 AM  PHQ 2/9 Scores  PHQ - 2 Score 0 6 0 0 1 0 0  PHQ- 9 Score  24         Fall Risk    03/12/2022    8:36 AM 12/09/2021   10:31 AM 05/06/2021    9:54 AM 08/16/2020    9:10 AM 08/07/2020    2:30 PM  [place] in the past year? 0 0 0 1 1  Comment    none since last reported   Number falls in past yr: 0 0 0  0  Injury with Fall? 0 0 0  1  Risk for fall due to : No Fall Risks No Fall Risks No Fall Risks    Follow up Falls evaluation completed Falls evaluation completed Falls evaluation completed  Falls evaluation completed    [place]: Home free of loose throw rugs in walkways, pet beds, electrical cords, etc? Yes  Adequate lighting in your home to reduce risk of falls? Yes   ASSISTIVE DEVICES UTILIZED TO PREVENT FALLS: Life alert? No  Use of a cane, walker or w/c? No  Grab bars in the bathroom? No  Shower chair or bench in shower? No  Elevated toilet seat or a handicapped toilet? No   TIMED UP AND GO: Was the test performed? No .   Cognitive Function:        03/12/2022    8:37 AM 08/16/2019    9:24 AM 08/09/2018   11:13 AM  6CIT Screen  What Year? 0 points 0 points 0 points  What month? 0 points 0 points 0 points  What time? 0 points 0 points 0 points  Count back from 20 0 points 0 points 0 points  Months in reverse 0 points 0 points 0 points  Repeat phrase 0  points  0 points  Total Score 0 points  0 points    Immunizations Immunization History  Administered Date(s) Administered   Fluad Quad(high Dose 65+) 02/23/2020   Influenza Whole 06/04/2008   Influenza, High Dose Seasonal PF 08/11/2017, 05/30/2018   Influenza,inj,Quad PF,6+ Mos 03/06/2014   Influenza-Unspecified 03/21/2012, 03/01/2013, 04/30/2015   PFIZER(Purple Top)SARS-COV-2 Vaccination 06/08/2019, 07/01/2019   Pneumococcal Conjugate-13 10/25/2013   Pneumococcal Polysaccharide-23 10/19/2012   Td 06/01/2005   TDAP status: Due, Education has been provided regarding the importance of this vaccine. Advised may receive this vaccine at local pharmacy or Health Dept. Aware to provide a copy of the vaccination record if obtained from local pharmacy or Health Dept. Verbalized acceptance and understanding.  Flu Vaccine status: Due, Education has been  provided regarding the importance of this vaccine. Advised may receive this vaccine at local pharmacy or Health Dept. Aware to provide a copy of the vaccination record if obtained from local pharmacy or Health Dept. Verbalized acceptance and understanding.  Covid-19 vaccine status: Completed vaccines x2.   Shingrix Completed?: No.    Education has been provided regarding the importance of this vaccine. Patient has been advised to call insurance company to determine out of pocket expense if they have not yet received this vaccine. Advised may also receive vaccine at local pharmacy or Health Dept. Verbalized acceptance and understanding.  Screening Tests Health Maintenance  Topic Date Due   COVID-19 Vaccine (3 - Pfizer risk series) 03/28/2022 (Originally 07/29/2019)   TETANUS/TDAP  05/01/2022 (Originally 06/02/2015)   Zoster Vaccines- Shingrix (1 of 2) 06/12/2022 (Originally 08/15/1964)   INFLUENZA VACCINE  08/30/2022 (Originally 12/30/2021)   MAMMOGRAM  12/24/2023   COLONOSCOPY (Pts 45-52yr Insurance coverage will need to be confirmed)  10/03/2024    Pneumonia Vaccine 76 Years old  Completed   DEXA SCAN  Completed   Hepatitis C Screening  Completed   HPV VACCINES  Aged Out    Health Maintenance  There are no preventive care reminders to display for this patient.  Lung Cancer Screening: (Low Dose CT Chest recommended if Age 74-80years, 30 pack-year currently smoking OR have quit w/in 15years.) does not qualify.   Hepatitis C Screening: Completed 2020  Vision Screening: Recommended annual ophthalmology exams for early detection of glaucoma and other disorders of the eye.  Dental Screening: Recommended annual dental exams for proper oral hygiene.  Community Resource Referral / Chronic Care Management: CRR required this visit?  No   CCM required this visit?  No      Plan:     I have personally reviewed and noted the following in the patient's chart:   Medical and social history Use of alcohol, tobacco or illicit drugs  Current medications and supplements including opioid prescriptions. Patient is currently taking opioid prescriptions. Information provided to patient regarding non-opioid alternatives. Patient advised to discuss non-opioid treatment plan with their provider. Followed by PCP. Functional ability and status Nutritional status Physical activity Advanced directives List of other physicians Hospitalizations, surgeries, and ER visits in previous 12 months Vitals Screenings to include cognitive, depression, and falls Referrals and appointments  In addition, I have reviewed and discussed with patient certain preventive protocols, quality metrics, and best practice recommendations. A written personalized care plan for preventive services as well as general preventive health recommendations were provided to patient.     OVarney Biles LPN   [phone]

## 2022-03-25 ENCOUNTER — Ambulatory Visit: Payer: PPO | Admitting: Oncology

## 2022-03-30 ENCOUNTER — Encounter (INDEPENDENT_AMBULATORY_CARE_PROVIDER_SITE_OTHER): Payer: Self-pay

## 2022-04-09 DIAGNOSIS — G4733 Obstructive sleep apnea (adult) (pediatric): Secondary | ICD-10-CM | POA: Diagnosis not present

## 2022-04-16 ENCOUNTER — Other Ambulatory Visit: Payer: Self-pay | Admitting: Family Medicine

## 2022-04-20 ENCOUNTER — Encounter: Payer: Self-pay | Admitting: Family Medicine

## 2022-04-20 ENCOUNTER — Ambulatory Visit (INDEPENDENT_AMBULATORY_CARE_PROVIDER_SITE_OTHER): Payer: PPO | Admitting: Family Medicine

## 2022-04-20 VITALS — BP 132/80 | HR 73 | Temp 98.4°F | Ht 64.0 in | Wt 188.8 lb

## 2022-04-20 DIAGNOSIS — I10 Essential (primary) hypertension: Secondary | ICD-10-CM | POA: Diagnosis not present

## 2022-04-20 DIAGNOSIS — R1032 Left lower quadrant pain: Secondary | ICD-10-CM

## 2022-04-20 DIAGNOSIS — R8290 Unspecified abnormal findings in urine: Secondary | ICD-10-CM

## 2022-04-20 DIAGNOSIS — F419 Anxiety disorder, unspecified: Secondary | ICD-10-CM | POA: Diagnosis not present

## 2022-04-20 DIAGNOSIS — F32A Depression, unspecified: Secondary | ICD-10-CM

## 2022-04-20 DIAGNOSIS — E559 Vitamin D deficiency, unspecified: Secondary | ICD-10-CM

## 2022-04-20 LAB — CBC WITH DIFFERENTIAL/PLATELET
Basophils Absolute: 0.1 10*3/uL (ref 0.0–0.1)
Basophils Relative: 1.7 % (ref 0.0–3.0)
Eosinophils Absolute: 0.4 10*3/uL (ref 0.0–0.7)
Eosinophils Relative: 6.5 % — ABNORMAL HIGH (ref 0.0–5.0)
HCT: 38.9 % (ref 36.0–46.0)
Hemoglobin: 12.9 g/dL (ref 12.0–15.0)
Lymphocytes Relative: 24.1 % (ref 12.0–46.0)
Lymphs Abs: 1.5 10*3/uL (ref 0.7–4.0)
MCHC: 33.2 g/dL (ref 30.0–36.0)
MCV: 94.2 fl (ref 78.0–100.0)
Monocytes Absolute: 0.7 10*3/uL (ref 0.1–1.0)
Monocytes Relative: 11.1 % (ref 3.0–12.0)
Neutro Abs: 3.6 10*3/uL (ref 1.4–7.7)
Neutrophils Relative %: 56.6 % (ref 43.0–77.0)
Platelets: 215 10*3/uL (ref 150.0–400.0)
RBC: 4.13 Mil/uL (ref 3.87–5.11)
RDW: 14.4 % (ref 11.5–15.5)
WBC: 6.3 10*3/uL (ref 4.0–10.5)

## 2022-04-20 LAB — COMPREHENSIVE METABOLIC PANEL
ALT: 11 U/L (ref 0–35)
AST: 15 U/L (ref 0–37)
Albumin: 4.3 g/dL (ref 3.5–5.2)
Alkaline Phosphatase: 68 U/L (ref 39–117)
BUN: 18 mg/dL (ref 6–23)
CO2: 31 mEq/L (ref 19–32)
Calcium: 9.3 mg/dL (ref 8.4–10.5)
Chloride: 103 mEq/L (ref 96–112)
Creatinine, Ser: 0.7 mg/dL (ref 0.40–1.20)
GFR: 85.04 mL/min (ref >60.00)
Glucose, Bld: 92 mg/dL (ref 70–99)
Potassium: 4.2 mEq/L (ref 3.5–5.1)
Sodium: 142 mEq/L (ref 135–145)
Total Bilirubin: 0.5 mg/dL (ref 0.2–1.2)
Total Protein: 6.9 g/dL (ref 6.0–8.3)

## 2022-04-20 LAB — POCT URINALYSIS DIPSTICK
Blood, UA: NEGATIVE
Glucose, UA: NEGATIVE
Ketones, UA: NEGATIVE
Nitrite, UA: NEGATIVE
Protein, UA: POSITIVE — AB
Spec Grav, UA: >=1.03 — AB (ref 1.010–1.025)
Urobilinogen, UA: 1 E.U./dL
pH, UA: 6 (ref 5.0–8.0)

## 2022-04-20 LAB — VITAMIN D 25 HYDROXY (VIT D DEFICIENCY, FRACTURES): VITD: 25.13 ng/mL — ABNORMAL LOW (ref 30.00–100.00)

## 2022-04-20 NOTE — Assessment & Plan Note (Signed)
Some tenderness on exam today.  She has no other symptoms that would indicate a specific cause.  We will start with lab work and a urinalysis.  Discussed the potential for getting imaging depending on her lab results.

## 2022-04-20 NOTE — Patient Instructions (Addendum)
Nice to see you. We will get lab work today and contact you with the results. If your abdominal discomfort worsens or you develop any new symptoms please get reevaluated.

## 2022-04-20 NOTE — Assessment & Plan Note (Addendum)
Adequately controlled.  She will continue HCTZ 25 mg daily.  We will hold off on the renal Doppler that was previously ordered as she has reported adequately controlled blood pressures.

## 2022-04-20 NOTE — Progress Notes (Signed)
[name], MD Phone: [phone]  Dana Obrien is a 76 y.o. female who presents today for f/u.  Anxiety/depression: Patient notes both of these have improved with being back on the Prozac.  She continues to take Xanax 1-2 times a week when her anxiety is triggered by her daughter.  She thinks her daughter has a personality disorder and is difficult to deal with.  She does not get drowsy with the Xanax.  No SI.  Hypertension: Typically 120s over 80.  Taking HCTZ.  No chest pain, shortness of breath, or edema.  Left side pain: Patient notes left-sided pain radiating to her groin over the last week.  She did some cleaning and wonders if that aggravated it.  Tramadol helps ease the pain.  An ice pack helps the most.  She describes the pain as tightening or an ache.  She is status post hysterectomy and BSO.  She reports she status post appendectomy.  She notes no dysuria, frequency, urgency, diarrhea, blood in her stool, or constipation.  Social History   Tobacco Use  Smoking Status Never  Smokeless Tobacco Never    Current Outpatient Medications on File Prior to Visit  Medication Sig Dispense Refill   albuterol (VENTOLIN HFA) 108 (90 Base) MCG/ACT inhaler Inhale 2 puffs into the lungs every 6 (six) hours as needed for wheezing or shortness of breath. 8 g 0   ALPRAZolam (XANAX) 0.25 MG tablet Take 1 tablet by mouth twice daily as needed for anxiety 20 tablet 0   celecoxib (CELEBREX) 100 MG capsule Take 1 capsule by mouth twice daily 60 capsule 0   EQ ALLERGY RELIEF, CETIRIZINE, 10 MG tablet Take 1 tablet by mouth once daily 90 tablet 0   EQ ARTHRITIS PAIN RELIEVER 1 % GEL APPLY 2 GRAMS TOPICALLY FOUR TIMES DAILY AS NEEDED 50 g 0   FLUoxetine HCl 60 MG TABS Take 1 tablet by mouth daily. 30 tablet 11   fluticasone (FLONASE) 50 MCG/ACT nasal spray Place 1 spray into both nostrils daily. 16 g 0   hydrochlorothiazide (HYDRODIURIL) 25 MG tablet Take 1 tablet (25 mg total) by mouth  daily. 90 tablet 0   Multiple Vitamin (MULTI-VITAMIN) tablet Take 1 tablet by mouth daily.      Omega-3 Fatty Acids (FISH OIL PO) Take by mouth daily.     pantoprazole (PROTONIX) 40 MG tablet Take 1 tablet by mouth once daily 90 tablet 0   rosuvastatin (CRESTOR) 40 MG tablet Take 1 tablet by mouth once daily 30 tablet 0   senna (SENOKOT) 8.6 MG TABS tablet Take 1 tablet by mouth at bedtime.      SUMAtriptan (IMITREX) 50 MG tablet Take 1 tablet (50 mg total) by mouth every 2 (two) hours as needed for migraine. May repeat in 2 hours if headache persists or recurs. No more than 2 doses in a 24 hour time frame. 10 tablet 0   traMADol (ULTRAM) 50 MG tablet TAKE 1 TABLET BY MOUTH EVERY 6 HOURS AS NEEDED 30 tablet 0   traZODone (DESYREL) 50 MG tablet TAKE 1/2 TO 1 (ONE-HALF TO ONE) TABLET BY MOUTH AT BEDTIME AS NEEDED FOR SLEEP 30 tablet 5   tretinoin (RETIN-A) 0.05 % cream Apply 1 application topically at bedtime. 45 g 0   No current facility-administered medications on file prior to visit.     ROS see history of present illness  Objective  Physical Exam Vitals:   04/20/22 0917 04/20/22 0932  BP: 138/84 132/80  Pulse: 73  Temp: 98.4 F (36.9 C)   SpO2: 99%     BP Readings from Last 3 Encounters:  04/20/22 132/80  01/09/22 125/80  12/09/21 130/70   Wt Readings from Last 3 Encounters:  04/20/22 188 lb 12.8 oz (85.6 kg)  03/12/22 186 lb (84.4 kg)  01/09/22 186 lb 12.8 oz (84.7 kg)    Physical Exam Constitutional:      General: She is not in acute distress.    Appearance: She is not diaphoretic.  Cardiovascular:     Rate and Rhythm: Normal rate and regular rhythm.     Heart sounds: Normal heart sounds.  Pulmonary:     Effort: Pulmonary effort is normal.     Breath sounds: Normal breath sounds.  Abdominal:     General: Bowel sounds are normal. There is no distension.     Palpations: Abdomen is soft. There is no mass.     Tenderness: There is abdominal tenderness (Left lower  quadrant). There is no guarding.  Musculoskeletal:     Comments: Full range of motion left hip with no discomfort, no discomfort over her lateral left hip area  Skin:    General: Skin is warm and dry.  Neurological:     Mental Status: She is alert.      Assessment/Plan: Please see individual problem list.  Problem List Items Addressed This Visit     Anxiety and depression (Chronic)    Much improved.  She can continue Prozac 60 mg daily and Xanax 0.25 mg twice daily as needed for anxiety.  Discussed limiting Xanax use to 1-2 times a week.  If she gets drowsy with this she will let us know.      Hypertension - Primary (Chronic)    Adequately controlled.  She will continue HCTZ 25 mg daily.  We will hold off on the renal Doppler that was previously ordered as she has reported adequately controlled blood pressures.      Left lower quadrant pain    Some tenderness on exam today.  She has no other symptoms that would indicate a specific cause.  We will start with lab work and a urinalysis.  Discussed the potential for getting imaging depending on her lab results.      Relevant Orders   Comp Met (CMET)   CBC w/Diff   POCT Urinalysis Dipstick   Vitamin D deficiency   Relevant Orders   Vitamin D (25 hydroxy)    Return in about 3 months (around 07/21/2022) for anxiety.   [name], MD [place]

## 2022-04-20 NOTE — Assessment & Plan Note (Signed)
Much improved.  She can continue Prozac 60 mg daily and Xanax 0.25 mg twice daily as needed for anxiety.  Discussed limiting Xanax use to 1-2 times a week.  If she gets drowsy with this she will let us know.

## 2022-04-20 NOTE — Addendum Note (Signed)
Addended by: Leeanne Rio on: 04/20/2022 12:19 PM   Modules accepted: Orders

## 2022-04-22 ENCOUNTER — Inpatient Hospital Stay: Payer: PPO | Admitting: Oncology

## 2022-04-23 LAB — URINE CULTURE
MICRO NUMBER:: 14212561
SPECIMEN QUALITY:: ADEQUATE

## 2022-04-23 LAB — PROTEIN / CREATININE RATIO, URINE
Creatinine, Urine: 155 mg/dL (ref 20–275)
Protein/Creat Ratio: 174 mg/g creat (ref 24–184)
Protein/Creatinine Ratio: 0.174 mg/mg creat (ref 0.024–0.184)
Total Protein, Urine: 27 mg/dL — ABNORMAL HIGH (ref 5–24)

## 2022-04-24 ENCOUNTER — Other Ambulatory Visit: Payer: Self-pay | Admitting: Family Medicine

## 2022-04-24 MED ORDER — CEPHALEXIN 500 MG PO CAPS: 500.0000 mg | ORAL_CAPSULE | Freq: Four times a day (QID) | ORAL | 0 refills | Status: DC

## 2022-04-30 ENCOUNTER — Telehealth: Payer: Self-pay

## 2022-04-30 NOTE — Telephone Encounter (Signed)
VOB submitted for Monov[place] bilateral knee.

## 2022-05-03 ENCOUNTER — Other Ambulatory Visit: Payer: Self-pay | Admitting: Family Medicine

## 2022-05-03 NOTE — Telephone Encounter (Signed)
LOV: 04/20/22 NOV: 07/21/22

## 2022-05-04 ENCOUNTER — Encounter: Payer: Self-pay | Admitting: Family Medicine

## 2022-05-06 ENCOUNTER — Other Ambulatory Visit: Payer: Self-pay

## 2022-05-07 ENCOUNTER — Ambulatory Visit (INDEPENDENT_AMBULATORY_CARE_PROVIDER_SITE_OTHER): Payer: PPO

## 2022-05-07 ENCOUNTER — Ambulatory Visit
Admission: RE | Admit: 2022-05-07 | Discharge: 2022-05-07 | Disposition: A | Discharge status: Home / Self Care | Payer: PPO | Source: Ambulatory Visit | Attending: Family Medicine | Admitting: Family Medicine

## 2022-05-07 VITALS — BP 155/75 | HR 73 | Temp 98.0°F | Resp 16

## 2022-05-07 DIAGNOSIS — R109 Unspecified abdominal pain: Secondary | ICD-10-CM

## 2022-05-07 DIAGNOSIS — R1030 Lower abdominal pain, unspecified: Secondary | ICD-10-CM

## 2022-05-07 DIAGNOSIS — M549 Dorsalgia, unspecified: Secondary | ICD-10-CM | POA: Diagnosis not present

## 2022-05-07 DIAGNOSIS — N390 Urinary tract infection, site not specified: Secondary | ICD-10-CM

## 2022-05-07 LAB — URINALYSIS, ROUTINE W REFLEX MICROSCOPIC
Bilirubin Urine: NEGATIVE
Glucose, UA: NEGATIVE mg/dL
Nitrite: NEGATIVE
Specific Gravity, Urine: 1.025 (ref 1.005–1.030)
pH: 5.5 (ref 5.0–8.0)

## 2022-05-07 LAB — URINALYSIS, MICROSCOPIC (REFLEX)

## 2022-05-07 MED ORDER — SULFAMETHOXAZOLE-TRIMETHOPRIM 800-160 MG PO TABS: 1.0000 | ORAL_TABLET | Freq: Two times a day (BID) | ORAL | 0 refills | Status: AC

## 2022-05-07 NOTE — Discharge Instructions (Addendum)
You were seen today for continued back pain and abdominal pain.  Your urine still shows possible infection.  Your xray was normal.  I have sent out a different antibiotic today to see if helpful.  I will send your urine for culture as well.  If you are not improving by next week please contact your primary care provider for further care.

## 2022-05-07 NOTE — ED Triage Notes (Signed)
Chief Complaint: "pain in my back, sides and groin area. I have been taking keflex  for ecoli in urine but have finished. Dr. Charlesetta Shanks asked me to go to urgent care" nausea when the pain in the low back and pelvis.   Onset: 2 weeks  Prescriptions or OTC medications tried: Yes- keflex    with mild relief  Sick exposure: No  New foods, medications, or products: No  Recent Travel: No

## 2022-05-07 NOTE — ED Provider Notes (Signed)
[facility]    CSN: [id number] Arrival date & time: 05/07/22  1047      History   Chief Complaint Chief Complaint  Patient presents with   Urinary Tract Infection    HPI Dana Obrien is a 76 y.o. female.   Patient is here for back/flank pain.  About 2 weeks ago she was moving boxes.  Started with left sided back/side pain and went into her groin area.  She did see her pcp, had blood work/urine/culture.  She was put on keflex for UTI, which she took entirely.  She finished those several days ago.  She then started feeling worse with pain on the right flank area going into the groin area.  She is using ice packs, taking motrin/ultram without help.  Worse with movement.  She was told to come to the urgent care for further evaluation.   She is still having urinary burning, some odor.  No frequency per se.  No fevers/chills.  She did not notice any improvement with the abx.  She does feel "pressure" or burning sensation at the low abdomen.  Her pain is better in the morning, and worse throughout the day after she has been doing more things.  She has to sit and rest at times.  The pain gets bad enough in the evenings she feels sick b/c of the pain.   No diarrhea or constipation.   She did not do a whole lot yesterday, and was not as bad.   Upon review of her chart she did have e coli, pan-sensative.        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   Cancer [facility]) 2004   right lumpectomy, chemotherapy and radiation Dr. Jeb Levering   CPAP (continuous positive airway pressure) dependence    Depression    GERD (gastroesophageal reflux disease)    Hypertension    Migraine    Personal history of chemotherapy  2004   Personal history of radiation therapy 2004   Personal history of radiation therapy 2019   PONV (postoperative nausea and vomiting)    Shingles    Sleep apnea    uses CPAP, severe OSA   Vitamin D deficiency    IN THE PAST    Patient Active Problem List   Diagnosis Date Noted   Left lower quadrant pain 04/20/2022   Pustule 12/09/2021   Tick bite of right upper arm 12/09/2021   Urge incontinence 07/11/2021   Right knee pain 11/08/2020   Concussion with no loss of consciousness 08/08/2020   Vitamin D deficiency 08/08/2020   Gastroesophageal reflux disease without esophagitis 03/25/2020   Dermatitis due to plants, including poison ivy, sumac, and oak 02/29/2020   Neuropathy 12/15/2019   Allergic rhinitis 07/05/2019   Brain atrophy ([place]) 05/18/2019   Bilateral hearing loss 08/15/2018   Decreased energy 08/15/2018   Prediabetes 06/02/2018   Arthralgia 06/02/2018   BPPV (benign paroxysmal positional vertigo), right 03/11/2018   Dysfunction of right eustachian tube 03/11/2018   Malignant neoplasm of upper-outer quadrant of left breast in female, estrogen receptor positive ([place]) 09/14/2017   Radiculopathy 08/24/2017   Fatty liver 08/24/2017   Hearing difficulty of  both ears 06/25/2017   Bilateral sensorineural hearing loss 10/13/2016   Right ear impacted cerumen 10/13/2016   GERD (gastroesophageal reflux disease) 09/07/2016   Acute cough 07/08/2016   Exertional shortness of breath 01/15/2016   Uterine prolapse 11/14/2015   OSA (obstructive sleep apnea) 11/01/2015   Back muscle spasm 04/30/2015   DDD (degenerative disc disease), lumbar 04/30/2015   Lumbar radiculitis 04/30/2015   Low back pain 01/01/2015   Varicose vein of leg 11/22/2014   Post herpetic neuralgia 03/06/2014   Increased frequency of urination 11/17/2013   Rectocele 11/17/2013   Cystocele 10/25/2013   Skin lesion 10/25/2013   Disorder of skin and subcutaneous tissue 10/25/2013   Midline cystocele  10/25/2013   Obesity, unspecified 10/19/2012   History of breast cancer 02/11/2012   Hyperlipidemia 05/11/2011   Osteopenia 05/11/2011   Disorder of bone and cartilage 05/11/2011   Anxiety and depression 03/04/2011   Hypertension 03/04/2011    Past Surgical History:  Procedure Laterality Date   ABDOMINAL HYSTERECTOMY     ANTERIOR AND POSTERIOR REPAIR     BREAST BIOPSY Right 2004   stereo. 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  3.5 cm maximum diameter, minimal margins 5 mm.  In situ component less than 10%.   BREAST LUMPECTOMY Right 2004   BREAST LUMPECTOMY Left 09/17/2017   Procedure: BREAST EXCISION, SENTINEL NODE BIOPSY;  Surgeon: Robert Bellow, MD;  Location: [facility];  Service: General;  Laterality: Left;   COLONOSCOPY  2015   COLONOSCOPY WITH PROPOFOL N/A 10/04/2019   Procedure: COLONOSCOPY WITH PROPOFOL;  Surgeon: Robert Bellow, MD;  Location: [facility];  Service: Endoscopy;  Laterality: N/A;   DILATION AND CURETTAGE OF UTERUS     ETHMOIDECTOMY Bilateral 11/05/2016   Procedure: ETHMOIDECTOMY;  Surgeon: Melida Quitter, MD;  Location: [place];  Service: ENT;  Laterality: Bilateral;   LAPAROTOMY     MAXILLARY ANTROSTOMY Bilateral 11/05/2016   Procedure: MAXILLARY ANTROSTOMY;  Surgeon: Melida Quitter, MD;  Location: [place];  Service: ENT;  Laterality: Bilateral;   SINUS ENDO W/FUSION Bilateral 11/05/2016   Procedure: FRONTAL RECESS EXPLORATION;  Surgeon: Melida Quitter, MD;  Location: [place];  Service: ENT;  Laterality: Bilateral;  BILATERAL ENDOSCOPIC SINUS SURGERY WITH FUSION   SINUS EXPLORATION  11/05/2016   SPHENOIDECTOMY Bilateral 11/05/2016   Procedure: SPHENOIDECTOMY;  Surgeon: Melida Quitter, MD;  Location: [facility];  Service: ENT;  Laterality: Bilateral;    OB History     Gravida  2    Para      Term      Preterm      AB      Living         SAB      IAB      Ectopic      Multiple      Live Births  2        Obstetric Comments  1st Menstrual Cycle:  15 1st Pregnancy: 31            Home Medications    Prior to Admission medications   Medication Sig Start Date End Date Taking? Authorizing Provider  albuterol (VENTOLIN HFA) 108 (90 Base) MCG/ACT inhaler Inhale 2 puffs into the lungs every 6 (six) hours as needed for wheezing or shortness of breath. 06/19/20   Leone Haven, MD  ALPRAZolam (XANAX) 0.25 MG tablet Take 1 tablet by mouth twice daily as needed for anxiety 05/04/22   Leone Haven, MD  celecoxib (CELEBREX) 100 MG capsule Take 1 capsule by mouth twice daily 05/04/22   Leone Haven, MD  cephALEXin (KEFLEX) 500 MG capsule Take 1 capsule (500 mg total) by mouth 4 (four) times daily. 04/24/22   Leone Haven, MD  EQ ALLERGY RELIEF, CETIRIZINE, 10 MG tablet Take 1 tablet by mouth once daily 10/01/21   Leone Haven, MD  EQ ARTHRITIS PAIN RELIEVER 1 % GEL APPLY 2 GRAMS TOPICALLY FOUR TIMES DAILY AS NEEDED 04/17/22   Leone Haven, MD  FLUoxetine HCl 60 MG TABS Take 1 tablet by mouth daily. 01/09/22   Leone Haven, MD  fluticasone (FLONASE) 50 MCG/ACT nasal spray Place 1 spray into both nostrils daily. 07/02/21   Leone Haven, MD  hydrochlorothiazide (HYDRODIURIL) 25 MG tablet Take 1 tablet (25 mg total) by mouth daily. 07/02/21   Leone Haven, MD  Multiple Vitamin (MULTI-VITAMIN) tablet Take 1 tablet by mouth daily.     [provider]  Omega-3 Fatty Acids (FISH OIL PO) Take by mouth daily.    [provider]  pantoprazole (PROTONIX) 40 MG tablet Take 1 tablet by mouth once daily 12/04/21   Leone Haven, MD  rosuvastatin (CRESTOR) 40 MG tablet Take 1 tablet by mouth once daily 02/25/22   Leone Haven, MD  senna (SENOKOT) 8.6 MG TABS tablet Take 1 tablet by mouth at bedtime.      [provider]  SUMAtriptan (IMITREX) 50 MG tablet Take 1 tablet (50 mg total) by mouth every 2 (two) hours as needed for migraine. May repeat in 2 hours if headache persists or recurs. No more than 2 doses in a 24 hour time frame. 02/10/22   Leone Haven, MD  traMADol (ULTRAM) 50 MG tablet TAKE 1 TABLET BY MOUTH EVERY 6 HOURS AS NEEDED 04/17/22   Leone Haven, MD  traZODone (DESYREL) 50 MG tablet TAKE 1/2 TO 1 (ONE-HALF TO ONE) TABLET BY MOUTH AT BEDTIME AS NEEDED FOR SLEEP 02/28/21   Leone Haven, MD  tretinoin (RETIN-A) 0.05 % cream Apply 1 application topically at bedtime. 05/31/18   Leone Haven, MD    Family History Family History  Problem Relation Age of Onset   Heart disease Mother    Hyperlipidemia Mother    Hypertension Mother    Hypertension Father    Osteoarthritis Father    Stroke Father    Alzheimer's disease Sister    Alzheimer's disease Brother    Brain cancer Brother    Healthy Daughter    Healthy Son    Breast cancer Neg Hx     Social History Social History   Tobacco Use   Smoking status: Never   Smokeless tobacco: Never  Vaping Use   Vaping Use: Never used  Substance Use Topics   Alcohol use: Yes    Comment: rarely   Drug use: No     Allergies   Lidocaine, Codeine, Morphine, Niacin, and Niacin and related   Review of Systems Review of Systems  Constitutional:  Negative for chills and fever.  HENT: Negative.    Respiratory: Negative.    Cardiovascular: Negative.   Gastrointestinal:  Positive for abdominal pain. Negative for constipation, diarrhea, nausea and vomiting.  Genitourinary:  Positive for dysuria. Negative for difficulty urinating and frequency.  Musculoskeletal:  Positive for back pain.  Neurological: Negative.   Hematological: Negative.   Psychiatric/Behavioral: Negative.       Physical Exam Triage Vital Signs ED Triage Vitals [05/07/22 1209]  Enc Vitals Group     BP (!) 155/75     Pulse Rate  73     Resp 16     Temp 98 F (36.7 C)     Temp Source Oral     SpO2 99 %     Weight      Height      Head Circumference      Peak Flow      Pain Score      Pain Loc      Pain Edu?      Excl. in [place]?    No data found.  Updated Vital Signs BP (!) 155/75 (BP Location: Left Arm)   Pulse 73   Temp 98 F (36.7 C) (Oral)   Resp 16   SpO2 99%   Visual Acuity Right Eye Distance:   Left Eye Distance:   Bilateral Distance:    Right Eye Near:   Left Eye Near:    Bilateral Near:     Physical Exam Constitutional:      General: She is not in acute distress.    Appearance: Normal appearance. She is not ill-appearing.  HENT:     Head: Normocephalic.  Cardiovascular:     Rate and Rhythm: Normal rate and regular rhythm.  Pulmonary:     Effort: Pulmonary effort is normal.     Breath sounds: Normal breath sounds.  Abdominal:     Palpations: Abdomen is soft.     Tenderness: There is no right CVA tenderness, left CVA tenderness, guarding or rebound.     Comments: Slight TTP to the lower abdomen, and to the RLQ and LLQ;  mild  Musculoskeletal:     Cervical back: Normal range of motion and neck supple.     Comments: Mild TTP to the left and right mid abdomen to light palpation;   Skin:    General: Skin is warm.  Neurological:     General: No focal deficit present.     Mental Status: She is alert.  Psychiatric:        Mood and Affect: Mood normal.      UC Treatments / Results  Labs (all labs ordered are listed, but only abnormal results are displayed) Labs Reviewed  URINALYSIS, ROUTINE W REFLEX MICROSCOPIC - Abnormal; Notable for the following components:      Result Value   APPearance CLOUDY (*)    Hgb urine dipstick TRACE (*)    Ketones, ur TRACE (*)    Protein, ur TRACE (*)    Leukocytes,Ua SMALL (*)    All other components within normal limits  URINALYSIS, MICROSCOPIC (REFLEX) - Abnormal; Notable for the following components:   Bacteria, UA FEW (*)    All other  components within normal limits    EKG   Radiology DG Abd 1 View  Result Date: 05/07/2022 CLINICAL DATA:  Abdominal pain for 2 weeks. EXAM: ABDOMEN - 1 VIEW COMPARISON:  None Available. FINDINGS: The bowel gas pattern is normal. No radio-opaque calculi or other significant radiographic abnormality are seen. IMPRESSION: Negative. Electronically Signed   By: Marijo Conception M.D.   On: 05/07/2022 13:17    Procedures Procedures (including critical care time)  Medications Ordered in UC Medications - No data to display  Initial Impression / Assessment and Plan / UC Course  I have reviewed the triage vital signs and the nursing notes.  Pertinent labs & imaging results that were available during my care of the patient were reviewed by me and considered in my medical decision making (see chart for details).   Patient was seen today for continued back and abdominal pain with urinary symptoms.  Her urine continues to show some evidence of infection. Her xray was normal.  Today I will place her on bactrim for continued symptoms.  I do not see anything that warrants further scanning at this time such as fever, chills, severe pain, nausea, vomiting, diarrhea.   However, if her symptoms should continue or worsen, then she should go to the ER for further testing.   Final Clinical Impressions(s) / UC Diagnoses   Final diagnoses:  Urinary tract infection without hematuria, site unspecified  Lower abdominal pain  Acute bilateral back pain, unspecified back location     Discharge Instructions      You were seen today for continued back pain and abdominal pain.  Your urine still shows possible infection.  Your xray was normal.  I have sent out a different antibiotic today to see if helpful.  I will send your urine for culture as well.  If you are not improving by next week please contact your primary care provider for further care.     ED Prescriptions     Medication Sig Dispense Auth.  Provider   sulfamethoxazole-trimethoprim (BACTRIM DS) 800-160 MG tablet Take 1 tablet by mouth 2 (two) times daily for 7 days. 14 tablet Rondel Oh, MD      PDMP not reviewed this encounter.   Rondel Oh, MD 05/07/22 1336

## 2022-05-10 LAB — URINE CULTURE

## 2022-05-28 ENCOUNTER — Other Ambulatory Visit: Payer: Self-pay | Admitting: Family Medicine

## 2022-05-31 ENCOUNTER — Other Ambulatory Visit: Payer: Self-pay | Admitting: Family Medicine

## 2022-06-15 ENCOUNTER — Other Ambulatory Visit: Payer: Self-pay | Admitting: Family Medicine

## 2022-06-23 ENCOUNTER — Other Ambulatory Visit: Payer: Self-pay | Admitting: Family

## 2022-06-29 ENCOUNTER — Other Ambulatory Visit: Payer: Self-pay | Admitting: Family

## 2022-06-29 ENCOUNTER — Other Ambulatory Visit: Payer: Self-pay | Admitting: Nurse Practitioner

## 2022-06-29 ENCOUNTER — Other Ambulatory Visit: Payer: Self-pay | Admitting: Family Medicine

## 2022-06-29 DIAGNOSIS — G4733 Obstructive sleep apnea (adult) (pediatric): Secondary | ICD-10-CM | POA: Diagnosis not present

## 2022-06-29 DIAGNOSIS — R6889 Other general symptoms and signs: Secondary | ICD-10-CM

## 2022-07-07 ENCOUNTER — Emergency Department: Payer: PPO

## 2022-07-07 ENCOUNTER — Emergency Department
Admission: EM | Admit: 2022-07-07 | Discharge: 2022-07-07 | Disposition: A | Discharge status: Home / Self Care | Payer: PPO | Attending: Emergency Medicine | Admitting: Emergency Medicine

## 2022-07-07 ENCOUNTER — Other Ambulatory Visit: Payer: Self-pay

## 2022-07-07 DIAGNOSIS — N1330 Unspecified hydronephrosis: Secondary | ICD-10-CM | POA: Diagnosis not present

## 2022-07-07 DIAGNOSIS — K760 Fatty (change of) liver, not elsewhere classified: Secondary | ICD-10-CM | POA: Diagnosis not present

## 2022-07-07 DIAGNOSIS — R109 Unspecified abdominal pain: Secondary | ICD-10-CM | POA: Diagnosis present

## 2022-07-07 DIAGNOSIS — N281 Cyst of kidney, acquired: Secondary | ICD-10-CM | POA: Diagnosis not present

## 2022-07-07 DIAGNOSIS — R1032 Left lower quadrant pain: Secondary | ICD-10-CM | POA: Diagnosis not present

## 2022-07-07 LAB — URINALYSIS, ROUTINE W REFLEX MICROSCOPIC
Bilirubin Urine: NEGATIVE
Glucose, UA: NEGATIVE mg/dL
Hgb urine dipstick: NEGATIVE
Ketones, ur: NEGATIVE mg/dL
Leukocytes,Ua: NEGATIVE
Nitrite: NEGATIVE
Protein, ur: 100 mg/dL — AB
Specific Gravity, Urine: 1.008 (ref 1.005–1.030)
pH: 8 (ref 5.0–8.0)

## 2022-07-07 LAB — CBC
HCT: 43.1 % (ref 36.0–46.0)
Hemoglobin: 14.4 g/dL (ref 12.0–15.0)
MCH: 30.5 pg (ref 26.0–34.0)
MCHC: 33.4 g/dL (ref 30.0–36.0)
MCV: 91.3 fL (ref 80.0–100.0)
Platelets: 267 10*3/uL (ref 150–400)
RBC: 4.72 MIL/uL (ref 3.87–5.11)
RDW: 13.5 % (ref 11.5–15.5)
WBC: 9 10*3/uL (ref 4.0–10.5)
nRBC: 0 % (ref 0.0–0.2)

## 2022-07-07 LAB — COMPREHENSIVE METABOLIC PANEL
ALT: 15 U/L (ref 0–44)
AST: 30 U/L (ref 15–41)
Albumin: 4.4 g/dL (ref 3.5–5.0)
Alkaline Phosphatase: 76 U/L (ref 38–126)
Anion gap: 12 (ref 5–15)
BUN: 20 mg/dL (ref 8–23)
CO2: 27 mmol/L (ref 22–32)
Calcium: 9.4 mg/dL (ref 8.9–10.3)
Chloride: 98 mmol/L (ref 98–111)
Creatinine, Ser: 0.73 mg/dL (ref 0.44–1.00)
GFR, Estimated: >60 mL/min (ref >60)
Glucose, Bld: 113 mg/dL — ABNORMAL HIGH (ref 70–99)
Potassium: 4.3 mmol/L (ref 3.5–5.1)
Sodium: 137 mmol/L (ref 135–145)
Total Bilirubin: 1.1 mg/dL (ref 0.3–1.2)
Total Protein: 8.4 g/dL — ABNORMAL HIGH (ref 6.5–8.1)

## 2022-07-07 LAB — LIPASE, BLOOD: Lipase: 30 U/L (ref 11–51)

## 2022-07-07 MED ORDER — ONDANSETRON HCL 4 MG/2ML IJ SOLN
4.0000 mg | Freq: Once | INTRAMUSCULAR | Status: AC
  Administered 2022-07-07: 4 mg via INTRAVENOUS
  Filled 2022-07-07: qty 2

## 2022-07-07 MED ORDER — OXYCODONE-ACETAMINOPHEN 5-325 MG PO TABS
1.0000 | ORAL_TABLET | Freq: Once | ORAL | Status: AC
  Administered 2022-07-07: 1 via ORAL
  Filled 2022-07-07: qty 1

## 2022-07-07 MED ORDER — ONDANSETRON 4 MG PO TBDP: 4.0000 mg | ORAL_TABLET | Freq: Three times a day (TID) | ORAL | 0 refills | Status: DC | PRN

## 2022-07-07 MED ORDER — KETOROLAC TROMETHAMINE 15 MG/ML IJ SOLN
15.0000 mg | Freq: Once | INTRAMUSCULAR | Status: AC
  Administered 2022-07-07: 15 mg via INTRAVENOUS
  Filled 2022-07-07: qty 1

## 2022-07-07 MED ORDER — IOHEXOL 350 MG/ML SOLN
100.0000 mL | Freq: Once | INTRAVENOUS | Status: AC | PRN
  Administered 2022-07-07: 100 mL via INTRAVENOUS

## 2022-07-07 MED ORDER — SODIUM CHLORIDE 0.9 % IV BOLUS
1000.0000 mL | Freq: Once | INTRAVENOUS | Status: AC
  Administered 2022-07-07: 1000 mL via INTRAVENOUS

## 2022-07-07 MED ORDER — MORPHINE SULFATE (PF) 4 MG/ML IV SOLN
4.0000 mg | Freq: Once | INTRAVENOUS | Status: AC
  Administered 2022-07-07: 4 mg via INTRAVENOUS
  Filled 2022-07-07: qty 1

## 2022-07-07 MED ORDER — IOHEXOL 300 MG/ML  SOLN: 100.0000 mL | Freq: Once | INTRAMUSCULAR | Status: DC | PRN

## 2022-07-07 NOTE — Discharge Instructions (Addendum)
You were seen in the emergency department for left-sided pain.  You had a CT scan that did not show any findings to explain this pain.  You did have some hydronephrosis (swelling of your right kidney), that just needs follow-up as an outpatient with your primary care physician.  Stay hydrated and drink plenty of fluids.  You did not have any obvious signs of urinary tract infection.  Apply ice and heat to the area.  You can use over-the-counter Lidoderm patches 4% to the area, remove after 12 hours.  Alternate ibuprofen and Tylenol.  Follow-up closely with your primary care physician and return to the emergency department for any ongoing or worsening pain.  Pain control:  Ibuprofen (motrin/aleve/advil) - You can take 3-4 tablets (600-800 mg) every 6 hours as needed for pain/fever.  Acetaminophen (tylenol) - You can take 2 extra strength tablets (1000 mg) every 6 hours as needed for pain/fever.  You can alternate these medications or take them together.  Make sure you eat food/drink water when taking these medications.

## 2022-07-07 NOTE — ED Provider Notes (Signed)
[facility] Provider Note    Event Date/Time   First MD Initiated Contact with Patient 07/07/22 1057     (approximate)   History   Abdominal Pain   HPI  Dana Obrien is a 77 y.o. female presents to the emergency department with left-sided flank pain.  Patient states that she was moving large bins 2 days ago.  States that for the past 1 to 2 days she has had left-sided abdominal pain and flank pain.  Radiates around to her front.  Denies any nausea vomiting or diarrhea.  Nothing improves or worsens the pain.  Denies any dysuria, urinary urgency or frequency.  Does state that she had similar symptoms on the right side whenever she was later diagnosed with zoster.     Physical Exam   Triage Vital Signs: ED Triage Vitals  Enc Vitals Group     BP 07/07/22 1056 (!) 189/98     Pulse Rate 07/07/22 1053 83     Resp 07/07/22 1053 18     Temp 07/07/22 1053 98.1 F (36.7 C)     Temp Source 07/07/22 1053 Oral     SpO2 07/07/22 1053 97 %     Weight 07/07/22 1053 188 lb 11.4 oz (85.6 kg)     Height 07/07/22 1053 '5\' 4"'$  (1.626 m)     Head Circumference --      Peak Flow --      Pain Score 07/07/22 1053 10     Pain Loc --      Pain Edu? --      Excl. in [place]? --     Most recent vital signs: Vitals:   07/07/22 1053 07/07/22 1056  BP:  (!) 189/98  Pulse: 83   Resp: 18   Temp: 98.1 F (36.7 C)   SpO2: 97%     Physical Exam Constitutional:      Appearance: She is well-developed.  HENT:     Head: Atraumatic.  Eyes:     Conjunctiva/sclera: Conjunctivae normal.  Cardiovascular:     Rate and Rhythm: Regular rhythm.  Pulmonary:     Effort: No respiratory distress.  Abdominal:     General: There is no distension.     Tenderness: There is no abdominal tenderness.  Musculoskeletal:        General: Normal range of motion.     Cervical back: Normal range of motion.  Skin:    General: Skin is warm.  Neurological:     Mental Status: She is alert.  Mental status is at baseline.     IMPRESSION / MDM / ASSESSMENT AND PLAN / ED COURSE  I reviewed the triage vital signs and the nursing notes.  Differential diagnosis including kidney stones, pyelonephritis, musculoskeletal, diverticulitis, intra-abdominal abscess  EKG  No tachycardic or bradycardic dysrhythmias while on cardiac telemetry.  RADIOLOGY I independently reviewed imaging, my interpretation of imaging: CT scan abdomen and pelvis with no signs of acute intra-abdominal process.  Read as no acute findings.  Mild right-sided hydronephrosis with no urolithiasis.  Unchanged hepatic steatosis.  LABS (all labs ordered are listed, but only abnormal results are displayed) Labs interpreted as -  No signs of urinary tract infection.  Creatinine at baseline.  No signs of an electrolyte abnormality  Labs Reviewed  COMPREHENSIVE METABOLIC PANEL - Abnormal; Notable for the following components:      Result Value   Glucose, Bld 113 (*)    Total Protein 8.4 (*)  All other components within normal limits  URINALYSIS, ROUTINE W REFLEX MICROSCOPIC - Abnormal; Notable for the following components:   Color, Urine YELLOW (*)    APPearance HAZY (*)    Protein, ur 100 (*)    Bacteria, UA RARE (*)    All other components within normal limits  LIPASE, BLOOD  CBC    TREATMENT  IV fluid, IV ketorolac, IV morphine  MDM  On reevaluation patient had significant improvement of her pain.  Repeat exam is nontender to palpation and currently only rates her pain as 4/10.  Possibly musculoskeletal.  No concern for mesenteric ischemia.  Clinical picture is not consistent with an ACS.  Given a dose of Percocet.  States that she feels much better and that she will follow-up with her primary care physician.  Given return precautions for any worsening symptoms.     PROCEDURES:  Critical Care performed: No  Procedures  Patient's presentation is most consistent with acute presentation with  potential threat to life or bodily function.   MEDICATIONS ORDERED IN ED: Medications  oxyCODONE-acetaminophen (PERCOCET/ROXICET) 5-325 MG per tablet 1 tablet (has no administration in time range)  sodium chloride 0.9 % bolus 1,000 mL (0 mLs Intravenous Stopped 07/07/22 1321)  ondansetron (ZOFRAN) injection 4 mg (4 mg Intravenous Given 07/07/22 1202)  ketorolac (TORADOL) 15 MG/ML injection 15 mg (15 mg Intravenous Given 07/07/22 1202)  morphine (PF) 4 MG/ML injection 4 mg (4 mg Intravenous Given 07/07/22 1320)  iohexol (OMNIPAQUE) 350 MG/ML injection 100 mL (100 mLs Intravenous Contrast Given 07/07/22 1325)    FINAL CLINICAL IMPRESSION(S) / ED DIAGNOSES   Final diagnoses:  Left lower quadrant abdominal pain     Rx / DC Orders   ED Discharge Orders     None        Note:  This document was prepared using Dragon voice recognition software and may include unintentional dictation errors.   Nathaniel Man, MD 07/07/22 1549

## 2022-07-07 NOTE — ED Triage Notes (Signed)
Pt here with pain in her upper and lower left flank. Pt has some nausea. Pt states the severe pain started x1 day.

## 2022-07-07 NOTE — ED Notes (Signed)
Pt presents to ED with /co of L sided flank pain that radiates to the L groin area. Pt denies any issues with urination. Pt is hypertensive on arrival, pt states HX of same but does endorse pain of 10/10 at this time.   Pt states this happened a few weeks ago but states it went away on its own but states it was the R side at that time.

## 2022-07-09 ENCOUNTER — Ambulatory Visit: Payer: PPO | Admitting: Orthopedic Surgery

## 2022-07-09 ENCOUNTER — Ambulatory Visit (INDEPENDENT_AMBULATORY_CARE_PROVIDER_SITE_OTHER): Payer: PPO

## 2022-07-09 DIAGNOSIS — M25552 Pain in left hip: Secondary | ICD-10-CM

## 2022-07-09 DIAGNOSIS — M5416 Radiculopathy, lumbar region: Secondary | ICD-10-CM | POA: Diagnosis not present

## 2022-07-09 MED ORDER — METHOCARBAMOL 500 MG PO TABS: 500.0000 mg | ORAL_TABLET | Freq: Three times a day (TID) | ORAL | 0 refills | Status: DC | PRN

## 2022-07-09 MED ORDER — PREDNISONE 5 MG (21) PO TBPK: ORAL_TABLET | ORAL | 0 refills | Status: DC

## 2022-07-09 MED ORDER — ONDANSETRON HCL 4 MG PO TABS: 4.0000 mg | ORAL_TABLET | Freq: Three times a day (TID) | ORAL | 0 refills | Status: DC | PRN

## 2022-07-12 ENCOUNTER — Encounter: Payer: Self-pay | Admitting: Orthopedic Surgery

## 2022-07-12 NOTE — Progress Notes (Signed)
Office Visit Note   Patient: Dana Obrien           Date of Birth: 1945/08/30           MRN: [id number] Visit Date: 07/09/2022 Requested by: Leone Haven, MD [address] [place],  [place] [address] PCP: Leone Haven, MD  Subjective: Chief Complaint  Patient presents with   Left Leg - Pain    HPI: Dana Obrien is a 77 y.o. female who presents to the office reporting left thigh and buttock pain and groin pain.  The pain wakes her from sleep at night.  Been going on for 6 weeks.  Denies much in the way of radiation or numbness and tingling.  She ambulates with a cane.  She did have left knee injection done 823.  Takes Ultram and Celebrex.  Having fairly intractable pain over the last week or 2.  Did have a history of UTI over Christmas treated with Keflex.  She went to the emergency department and those notes are reviewed.  Does have pain on both sides of her lower lumbar spine.  Urinalysis negative from the emergency department and CT scan of the abdomen pelvis also failed to demonstrate any definite pathology.  White count normal at that time as well.  Patient states it hurts her to sit and hurts her to stand..                ROS: All systems reviewed are negative as they relate to the chief complaint within the history of present illness.  Patient denies fevers or chills.  Assessment & Plan: Visit Diagnoses:  1. Pain in left hip   2. Radiculopathy, lumbar region     Plan: Impression is unclear etiology of the left thigh and buttock pain.  Her hip exam today is fairly benign for intra-articular joint pathology.  I think it is most likely to be coming from her back based on her radiographs.  Plan at this time is MRI lumbar spine to evaluate left-sided radiculopathy.  Medrol Dosepak 6-day course along with Tylenol 3.  Zofran for nausea and Robaxin also has a muscle relaxer.  Follow-up after that study.  She does have a codeine allergy but that gives her  nausea and we are prescribing her Zofran for that reason.  This is a strategy that she describes has been effective in the past.  Follow-Up Instructions: No follow-ups on file.   Orders:  Orders Placed This Encounter  Procedures   XR HIP UNILAT W OR W/O PELVIS 2-3 VIEWS LEFT   XR Lumbar Spine 2-3 Views   MR Lumbar Spine w/o contrast   Meds ordered this encounter  Medications   methocarbamol (ROBAXIN) 500 MG tablet    Sig: Take 1 tablet (500 mg total) by mouth every 8 (eight) hours as needed for muscle spasms.    Dispense:  30 tablet    Refill:  0   ondansetron (ZOFRAN) 4 MG tablet    Sig: Take 1 tablet (4 mg total) by mouth every 8 (eight) hours as needed for nausea or vomiting.    Dispense:  20 tablet    Refill:  0   predniSONE (STERAPRED UNI-PAK 21 TAB) 5 MG (21) TBPK tablet    Sig: Take dosepak as directed    Dispense:  21 tablet    Refill:  0      Procedures: No procedures performed   Clinical Data: No additional findings.  Objective: Vital  Signs: There were no vitals taken for this visit.  Physical Exam:  Constitutional: Patient appears well-developed HEENT:  Head: Normocephalic Eyes:EOM are normal Neck: Normal range of motion Cardiovascular: Normal rate Pulmonary/chest: Effort normal Neurologic: Patient is alert Skin: Skin is warm Psychiatric: Patient has normal mood and affect  Ortho Exam: Ortho exam demonstrates no nerve root tension signs.  No groin pain on the right or left-hand side with internal or external rotation of the leg.  No definite paresthesias L1-S1 bilaterally.  No effusion in either knee.  Patient has 5 out of 5 ankle dorsiflexion plantarflexion quad and hamstring strength.  Mild pain with forward and lateral bending.  No discrete trochanteric tenderness.  No rashes in the back region, specifically no shingles type rash is noted.  Specialty Comments:  No specialty comments available.  Imaging: No results found.   PMFS  History: Patient Active Problem List   Diagnosis Date Noted   Left lower quadrant pain 04/20/2022   Pustule 12/09/2021   Tick bite of right upper arm 12/09/2021   Urge incontinence 07/11/2021   Right knee pain 11/08/2020   Concussion with no loss of consciousness 08/08/2020   Vitamin D deficiency 08/08/2020   Gastroesophageal reflux disease without esophagitis 03/25/2020   Dermatitis due to plants, including poison ivy, sumac, and oak 02/29/2020   Neuropathy 12/15/2019   Allergic rhinitis 07/05/2019   Brain atrophy ([place]) 05/18/2019   Bilateral hearing loss 08/15/2018   Decreased energy 08/15/2018   Prediabetes 06/02/2018   Arthralgia 06/02/2018   BPPV (benign paroxysmal positional vertigo), right 03/11/2018   Dysfunction of right eustachian tube 03/11/2018   Malignant neoplasm of upper-outer quadrant of left breast in female, estrogen receptor positive ([place]) 09/14/2017   Radiculopathy 08/24/2017   Fatty liver 08/24/2017   Hearing difficulty of both ears 06/25/2017   Bilateral sensorineural hearing loss 10/13/2016   Right ear impacted cerumen 10/13/2016   GERD (gastroesophageal reflux disease) 09/07/2016   Acute cough 07/08/2016   Exertional shortness of breath 01/15/2016   Uterine prolapse 11/14/2015   OSA (obstructive sleep apnea) 11/01/2015   Back muscle spasm 04/30/2015   DDD (degenerative disc disease), lumbar 04/30/2015   Lumbar radiculitis 04/30/2015   Low back pain 01/01/2015   Varicose vein of leg 11/22/2014   Post herpetic neuralgia 03/06/2014   Increased frequency of urination 11/17/2013   Rectocele 11/17/2013   Cystocele 10/25/2013   Skin lesion 10/25/2013   Disorder of skin and subcutaneous tissue 10/25/2013   Midline cystocele 10/25/2013   Obesity, unspecified 10/19/2012   History of breast cancer 02/11/2012   Hyperlipidemia 05/11/2011   Osteopenia 05/11/2011   Disorder of bone and cartilage 05/11/2011   Anxiety and depression 03/04/2011   Hypertension  03/04/2011   Past Medical History:  Diagnosis Date   Anxiety    Arthritis    Asthma    Breast cancer ([place]) 2004   right breast   Breast cancer ([place]) 09/08/2017   left breast   Breast cancer of upper-outer quadrant of left female breast ([place]) 09/17/2017   T1c, N0, ER 90%, PR 90%, 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   Cancer [facility]) 2004   right lumpectomy, chemotherapy and radiation Dr. Jeb Levering   CPAP (continuous positive airway pressure) dependence    Depression    GERD (gastroesophageal reflux disease)    Hypertension    Migraine    Personal history of chemotherapy 2004   Personal history of radiation therapy 2004   Personal history of radiation therapy 2019   PONV (postoperative  nausea and vomiting)    Shingles    Sleep apnea    uses CPAP, severe OSA   Vitamin D deficiency    IN THE PAST    Family History  Problem Relation Age of Onset   Heart disease Mother    Hyperlipidemia Mother    Hypertension Mother    Hypertension Father    Osteoarthritis Father    Stroke Father    Alzheimer's disease Sister    Alzheimer's disease Brother    Brain cancer Brother    Healthy Daughter    Healthy Son    Breast cancer Neg Hx     Past Surgical History:  Procedure Laterality Date   ABDOMINAL HYSTERECTOMY     ANTERIOR AND POSTERIOR REPAIR     BREAST BIOPSY Right 2004   stereo. 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  3.5 cm maximum diameter, minimal margins 5 mm.  In situ component less than 10%.   BREAST LUMPECTOMY Right 2004   BREAST LUMPECTOMY Left 09/17/2017   Procedure: BREAST EXCISION, SENTINEL NODE BIOPSY;  Surgeon: Robert Bellow, MD;  Location: [facility] ORS;  Service: General;  Laterality: Left;   COLONOSCOPY  2015   COLONOSCOPY WITH PROPOFOL N/A 10/04/2019   Procedure: COLONOSCOPY WITH  PROPOFOL;  Surgeon: Robert Bellow, MD;  Location: [facility] ENDOSCOPY;  Service: Endoscopy;  Laterality: N/A;   DILATION AND CURETTAGE OF UTERUS     ETHMOIDECTOMY Bilateral 11/05/2016   Procedure: ETHMOIDECTOMY;  Surgeon: Melida Quitter, MD;  Location: [place];  Service: ENT;  Laterality: Bilateral;   LAPAROTOMY     MAXILLARY ANTROSTOMY Bilateral 11/05/2016   Procedure: MAXILLARY ANTROSTOMY;  Surgeon: Melida Quitter, MD;  Location: [place] [place];  Service: ENT;  Laterality: Bilateral;   SINUS ENDO W/FUSION Bilateral 11/05/2016   Procedure: FRONTAL RECESS EXPLORATION;  Surgeon: Melida Quitter, MD;  Location: [place] [facility];  Service: ENT;  Laterality: Bilateral;  BILATERAL ENDOSCOPIC SINUS SURGERY WITH FUSION   SINUS EXPLORATION  11/05/2016   SPHENOIDECTOMY Bilateral 11/05/2016   Procedure: SPHENOIDECTOMY;  Surgeon: Melida Quitter, MD;  Location: [facility] OR;  Service: ENT;  Laterality: Bilateral;   Social History   Occupational History   Not on file  Tobacco Use   Smoking status: Never   Smokeless tobacco: Never  Vaping Use   Vaping Use: Never used  Substance and Sexual Activity   Alcohol use: Yes    Comment: rarely   Drug use: No   Sexual activity: Not Currently

## 2022-07-13 ENCOUNTER — Ambulatory Visit
Admission: RE | Admit: 2022-07-13 | Discharge: 2022-07-13 | Disposition: A | Discharge status: Home / Self Care | Payer: PPO | Source: Ambulatory Visit | Attending: Orthopedic Surgery | Admitting: Orthopedic Surgery

## 2022-07-13 DIAGNOSIS — M545 Low back pain, unspecified: Secondary | ICD-10-CM | POA: Diagnosis not present

## 2022-07-13 DIAGNOSIS — M5416 Radiculopathy, lumbar region: Secondary | ICD-10-CM

## 2022-07-13 NOTE — Progress Notes (Signed)
Can you order t spine mri thx

## 2022-07-14 ENCOUNTER — Other Ambulatory Visit: Payer: Self-pay

## 2022-07-14 ENCOUNTER — Telehealth: Payer: Self-pay | Admitting: Orthopedic Surgery

## 2022-07-14 DIAGNOSIS — M546 Pain in thoracic spine: Secondary | ICD-10-CM

## 2022-07-14 NOTE — Telephone Encounter (Signed)
Order has been placed.

## 2022-07-14 NOTE — Telephone Encounter (Signed)
Patient called advised Dana Obrien told her that she may need to have another MRI and will. Need an order for a Thoracic MRI.  Patient is scheduled to come to her appointment 07/27/2022 for MRI review.   The number to contact patient is [phone]

## 2022-07-15 ENCOUNTER — Ambulatory Visit
Admission: RE | Admit: 2022-07-15 | Discharge: 2022-07-15 | Disposition: A | Discharge status: Home / Self Care | Payer: PPO | Source: Ambulatory Visit | Attending: Orthopedic Surgery | Admitting: Orthopedic Surgery

## 2022-07-15 DIAGNOSIS — M4313 Spondylolisthesis, cervicothoracic region: Secondary | ICD-10-CM | POA: Diagnosis not present

## 2022-07-15 DIAGNOSIS — M4803 Spinal stenosis, cervicothoracic region: Secondary | ICD-10-CM | POA: Diagnosis not present

## 2022-07-15 DIAGNOSIS — M5124 Other intervertebral disc displacement, thoracic region: Secondary | ICD-10-CM | POA: Diagnosis not present

## 2022-07-15 DIAGNOSIS — M5134 Other intervertebral disc degeneration, thoracic region: Secondary | ICD-10-CM | POA: Diagnosis not present

## 2022-07-15 DIAGNOSIS — M546 Pain in thoracic spine: Secondary | ICD-10-CM

## 2022-07-16 ENCOUNTER — Telehealth: Payer: Self-pay | Admitting: Orthopedic Surgery

## 2022-07-16 NOTE — Telephone Encounter (Signed)
Patient called  and advised she is having more discomfort and some numbness. Please advise

## 2022-07-20 ENCOUNTER — Telehealth: Payer: Self-pay

## 2022-07-20 NOTE — Telephone Encounter (Signed)
I called.  The pain meds and steroids are not helping much.  Still having some left leg numbness now on the top of her leg.  She is able to straighten her leg so does not sound like there is much weakness.  Would you mind setting her up with epidural steroid injections with Dr. Ernestina Patches this week and then follow-up with Dr. Laurance Flatten next week.  Sounds like it may or may not be surgical based on the amount of clinical symptoms she is having.  Thanks.  Also can you cancel her appointment for the 21st.  Thanks

## 2022-07-20 NOTE — Telephone Encounter (Signed)
     Patient  visit on 2/6  at [facility] you been able to follow up with your primary care physician? Yes   The patient was or was not able to obtain any needed medicine or equipment. Yes   Are there diet recommendations that you are having difficulty following? Na   Patient expresses understanding of discharge instructions and education provided has no other needs at this time.  Yes      [facility] [phone] [address], [place], [place] [address] Phone: [phone] Email: [email or web address]

## 2022-07-21 ENCOUNTER — Ambulatory Visit (INDEPENDENT_AMBULATORY_CARE_PROVIDER_SITE_OTHER): Payer: PPO | Admitting: Family Medicine

## 2022-07-21 ENCOUNTER — Encounter: Payer: Self-pay | Admitting: Family Medicine

## 2022-07-21 ENCOUNTER — Other Ambulatory Visit: Payer: Self-pay | Admitting: Orthopedic Surgery

## 2022-07-21 VITALS — BP 124/80 | HR 84 | Temp 98.1°F | Ht 64.0 in | Wt 193.0 lb

## 2022-07-21 DIAGNOSIS — F419 Anxiety disorder, unspecified: Secondary | ICD-10-CM

## 2022-07-21 DIAGNOSIS — M545 Low back pain, unspecified: Secondary | ICD-10-CM | POA: Diagnosis not present

## 2022-07-21 DIAGNOSIS — N1330 Unspecified hydronephrosis: Secondary | ICD-10-CM | POA: Diagnosis not present

## 2022-07-21 DIAGNOSIS — F32A Depression, unspecified: Secondary | ICD-10-CM

## 2022-07-21 DIAGNOSIS — G8929 Other chronic pain: Secondary | ICD-10-CM | POA: Diagnosis not present

## 2022-07-21 DIAGNOSIS — I10 Essential (primary) hypertension: Secondary | ICD-10-CM

## 2022-07-21 DIAGNOSIS — M5416 Radiculopathy, lumbar region: Secondary | ICD-10-CM

## 2022-07-21 DIAGNOSIS — K219 Gastro-esophageal reflux disease without esophagitis: Secondary | ICD-10-CM

## 2022-07-21 DIAGNOSIS — R7303 Prediabetes: Secondary | ICD-10-CM

## 2022-07-21 DIAGNOSIS — E785 Hyperlipidemia, unspecified: Secondary | ICD-10-CM | POA: Diagnosis not present

## 2022-07-21 LAB — LIPID PANEL
Cholesterol: 174 mg/dL (ref 0–200)
HDL: 76 mg/dL (ref >39.00)
LDL Cholesterol: 71 mg/dL (ref 0–99)
NonHDL: 98.24
Total CHOL/HDL Ratio: 2
Triglycerides: 135 mg/dL (ref 0.0–149.0)
VLDL: 27 mg/dL (ref 0.0–40.0)

## 2022-07-21 LAB — VITAMIN B12: Vitamin B-12: 186 pg/mL — ABNORMAL LOW (ref 211–911)

## 2022-07-21 LAB — HEMOGLOBIN A1C: Hgb A1c MFr Bld: 5.9 % (ref 4.6–6.5)

## 2022-07-21 MED ORDER — PREDNISONE 50 MG PO TABS: 50.0000 mg | ORAL_TABLET | Freq: Every day | ORAL | 0 refills | Status: DC

## 2022-07-21 NOTE — Telephone Encounter (Signed)
Waiting for injection to be scheduled so I can get her in with Dr. Laurance Flatten

## 2022-07-21 NOTE — Assessment & Plan Note (Signed)
Check A1c. 

## 2022-07-21 NOTE — Progress Notes (Signed)
[name], MD Phone: [phone]  Dana Obrien is a 77 y.o. female who presents today for f/u.  HYPERTENSION Disease Monitoring Home BP Monitoring not checking Chest pain- no    Dyspnea- no Medications Compliance-  taking HCTZ.  Edema- no BMET    Component Value Date/Time   NA 137 07/07/2022 1105   NA 139 11/20/2013 1534   K 4.3 07/07/2022 1105   K 3.9 11/20/2013 1534   CL 98 07/07/2022 1105   CL 105 11/20/2013 1534   CO2 27 07/07/2022 1105   CO2 32 11/20/2013 1534   GLUCOSE 113 (H) 07/07/2022 1105   GLUCOSE 104 (H) 11/20/2013 1534   BUN 20 07/07/2022 1105   BUN 20 (H) 11/20/2013 1534   CREATININE 0.73 07/07/2022 1105   CREATININE 1.12 (H) 11/01/2015 1622   CALCIUM 9.4 07/07/2022 1105   CALCIUM 9.2 11/20/2013 1534   GFRNONAA >60 07/07/2022 1105   GFRNONAA >60 11/20/2013 1534   GFRAA >60 04/10/2019 0240   GFRAA >60 11/20/2013 1534   GERD:   Reflux symptoms: no   Abd pain: no   Blood in stool: no  Dysphagia: no   EGD: none  Medication: taking protonix  Anxiety/depression: Patient notes a lot of anxiety related to some back pain issues that she has been having.  She continues on Prozac and Xanax.  No SI.  Left low back pain: Patient notes she went to the hospital because she was having left low back pain that radiated to her left lower quadrant.  She had a CT scan of her abdomen and pelvis that revealed mild right hydronephrosis though otherwise did not reveal a cause.  She was up seeing her orthopedist and had an MRI thoracic and lumbar spine that did reveal some scoliosis and degenerative changes.  She notes her orthopedist referred her to a spine specialist.  She has been taking tramadol, Robaxin, and codeine intermittently with no benefit.  She notes the best thing is a heating pad.  She notes a steroid taper over 6 days did not help.  She does note some numbness and tingling in her left anterior lateral thigh that started over the weekend.   Social  History   Tobacco Use  Smoking Status Never  Smokeless Tobacco Never    Current Outpatient Medications on File Prior to Visit  Medication Sig Dispense Refill   albuterol (VENTOLIN HFA) 108 (90 Base) MCG/ACT inhaler Inhale 2 puffs into the lungs every 6 (six) hours as needed for wheezing or shortness of breath. 8 g 0   ALPRAZolam (XANAX) 0.25 MG tablet Take 1 tablet by mouth twice daily as needed for anxiety 20 tablet 0   celecoxib (CELEBREX) 100 MG capsule Take 1 capsule by mouth twice daily 60 capsule 0   EQ ALLERGY RELIEF, CETIRIZINE, 10 MG tablet Take 1 tablet by mouth once daily 90 tablet 0   EQ ARTHRITIS PAIN RELIEVER 1 % GEL APPLY 2 GRAMS TOPICALLY FOUR TIMES DAILY AS NEEDED 50 g 0   FLUoxetine HCl 60 MG TABS Take 1 tablet by mouth daily. 30 tablet 11   fluticasone (FLONASE) 50 MCG/ACT nasal spray Place 1 spray into both nostrils daily. 16 g 0   hydrochlorothiazide (HYDRODIURIL) 25 MG tablet Take 1 tablet (25 mg total) by mouth daily. 90 tablet 0   methocarbamol (ROBAXIN) 500 MG tablet Take 1 tablet (500 mg total) by mouth every 8 (eight) hours as needed for muscle spasms. 30 tablet 0   Multiple Vitamin (MULTI-VITAMIN) tablet  Take 1 tablet by mouth daily.      Omega-3 Fatty Acids (FISH OIL PO) Take by mouth daily.     ondansetron (ZOFRAN) 4 MG tablet Take 1 tablet (4 mg total) by mouth every 8 (eight) hours as needed for nausea or vomiting. 20 tablet 0   ondansetron (ZOFRAN-ODT) 4 MG disintegrating tablet Take 1 tablet (4 mg total) by mouth every 8 (eight) hours as needed for nausea or vomiting. 30 tablet 0   pantoprazole (PROTONIX) 40 MG tablet Take 1 tablet by mouth once daily 90 tablet 0   rosuvastatin (CRESTOR) 40 MG tablet Take 1 tablet by mouth once daily 30 tablet 0   senna (SENOKOT) 8.6 MG TABS tablet Take 1 tablet by mouth at bedtime.      SUMAtriptan (IMITREX) 50 MG tablet Take 1 tablet (50 mg total) by mouth every 2 (two) hours as needed for migraine. May repeat in 2 hours  if headache persists or recurs. No more than 2 doses in a 24 hour time frame. 10 tablet 0   traMADol (ULTRAM) 50 MG tablet TAKE 1 TABLET BY MOUTH EVERY 6 HOURS AS NEEDED 30 tablet 0   traZODone (DESYREL) 50 MG tablet TAKE 1/2 TO 1 (ONE-HALF TO ONE) TABLET BY MOUTH AT BEDTIME AS NEEDED FOR SLEEP 30 tablet 5   tretinoin (RETIN-A) 0.05 % cream Apply 1 application topically at bedtime. 45 g 0   No current facility-administered medications on file prior to visit.     ROS see history of present illness  Objective  Physical Exam Vitals:   07/21/22 1011 07/21/22 1030  BP: 118/80 124/80  Pulse: 84   Temp: 98.1 F (36.7 C)   SpO2: 94%     BP Readings from Last 3 Encounters:  07/21/22 124/80  07/07/22 (!) 164/86  05/07/22 (!) 155/75   Wt Readings from Last 3 Encounters:  07/21/22 193 lb (87.5 kg)  07/07/22 188 lb 11.4 oz (85.6 kg)  04/20/22 188 lb 12.8 oz (85.6 kg)    Physical Exam Constitutional:      General: She is not in acute distress.    Appearance: She is not diaphoretic.  Cardiovascular:     Rate and Rhythm: Normal rate and regular rhythm.     Heart sounds: Normal heart sounds.  Pulmonary:     Effort: Pulmonary effort is normal.     Breath sounds: Normal breath sounds.  Musculoskeletal:     Comments: No midline spine tenderness, no midline spine step-off, there is some muscular tenderness in the lumbar spine  Skin:    General: Skin is warm and dry.  Neurological:     Mental Status: She is alert.     Comments: 5/5 strength bilateral quads, hamstrings, plantarflexion, and dorsiflexion, sensation light touch slightly reduced in her left lateral anterior thigh though otherwise light touch sensation is intact in bilateral lower extremities      Assessment/Plan: Please see individual problem list.  Primary hypertension Assessment & Plan: Chronic issue.  Generally stable.  Slightly above goal on the diastolic may be related to her pain.  She will continue  hydrochlorothiazide 25 mg daily.   Anxiety and depression Assessment & Plan: Chronic issue.  Anxiety has worsened recently with her back issues.  She will continue Prozac 60 mg daily and Xanax as needed.   Hyperlipidemia, unspecified hyperlipidemia type -     Lipid panel  Gastroesophageal reflux disease, unspecified whether esophagitis present Assessment & Plan: Chronic issue.  Well-controlled.  She will  continue Protonix 40 mg daily.  Check B12 level given that she is on chronic PPI.  Orders: -     Vitamin B12  Chronic left-sided low back pain without sciatica Assessment & Plan: Chronic issue.  Worsened recently with nerve impingement symptoms.  She will follow-up with the spine specialist as planned.  I will place her on prednisone 50 mg daily for 5 days given that her tapering steroid pack did not help.  Discussed risks of agitation and sleep issues with the prednisone.  Orders: -     predniSONE; Take 1 tablet (50 mg total) by mouth daily with breakfast.  Dispense: 5 tablet; Refill: 0  Hydronephrosis, unspecified hydronephrosis type Assessment & Plan: Mild and noted on CT imaging recently.  Refer to urology for further evaluation.  Orders: -     Ambulatory referral to Urology  Prediabetes Assessment & Plan: Check A1c.  Orders: -     Hemoglobin A1c     Return in about 3 months (around 10/19/2022).   [name], MD [place]

## 2022-07-21 NOTE — Assessment & Plan Note (Signed)
Chronic issue.  Anxiety has worsened recently with her back issues.  She will continue Prozac 60 mg daily and Xanax as needed.

## 2022-07-21 NOTE — Assessment & Plan Note (Signed)
Mild and noted on CT imaging recently.  Refer to urology for further evaluation.

## 2022-07-21 NOTE — Telephone Encounter (Signed)
Called [place] with [facility] imaging- left message to return my call to see if can get pt in this week.

## 2022-07-21 NOTE — Assessment & Plan Note (Addendum)
Chronic issue.  Generally stable.  Slightly above goal on the diastolic may be related to her pain.  She will continue hydrochlorothiazide 25 mg daily.

## 2022-07-21 NOTE — Assessment & Plan Note (Addendum)
Chronic issue.  Well-controlled.  She will continue Protonix 40 mg daily.  Check B12 level given that she is on chronic PPI.

## 2022-07-21 NOTE — Assessment & Plan Note (Signed)
Chronic issue.  Worsened recently with nerve impingement symptoms.  She will follow-up with the spine specialist as planned.  I will place her on prednisone 50 mg daily for 5 days given that her tapering steroid pack did not help.  Discussed risks of agitation and sleep issues with the prednisone.

## 2022-07-21 NOTE — Patient Instructions (Signed)
Nice to see you. Please take the prednisone 50 mg once daily for 5 days.  If you have excessive agitation or excessive sleeping issues on this please discontinue it and let us know right away. Nephrology should contact you to set up evaluation of the hydronephrosis in your right kidney. We will get lab work today and contact you with the results.

## 2022-07-22 ENCOUNTER — Ambulatory Visit: Payer: PPO | Admitting: Orthopedic Surgery

## 2022-07-22 ENCOUNTER — Other Ambulatory Visit: Payer: Self-pay

## 2022-07-22 ENCOUNTER — Other Ambulatory Visit: Payer: Self-pay | Admitting: Family Medicine

## 2022-07-22 ENCOUNTER — Telehealth: Payer: Self-pay

## 2022-07-22 ENCOUNTER — Other Ambulatory Visit: Payer: Self-pay | Admitting: Orthopedic Surgery

## 2022-07-22 MED ORDER — ALPRAZOLAM 0.25 MG PO TABS: 0.2500 mg | ORAL_TABLET | Freq: Two times a day (BID) | ORAL | 0 refills | Status: DC | PRN

## 2022-07-22 MED ORDER — ROSUVASTATIN CALCIUM 40 MG PO TABS: 40.0000 mg | ORAL_TABLET | Freq: Every day | ORAL | 3 refills | Status: DC

## 2022-07-22 NOTE — Telephone Encounter (Signed)
Attempted to call Patient to let her know that Dr. Caryl Bis sent refills in and to let us know if any were missed.

## 2022-07-22 NOTE — Telephone Encounter (Signed)
-----   Message from Leone Haven, MD sent at 07/22/2022  2:50 PM EST ----- Refills sent to pharmacy. Please let me know if I missed any.

## 2022-07-22 NOTE — Telephone Encounter (Signed)
Holding for [place] to get on schedule. It appears injection is scheduled for 07/28/2022.

## 2022-07-22 NOTE — Telephone Encounter (Signed)
Ok to r f pls call thzx

## 2022-07-23 ENCOUNTER — Encounter: Payer: Self-pay | Admitting: Orthopedic Surgery

## 2022-07-23 ENCOUNTER — Telehealth: Payer: Self-pay | Admitting: Orthopedic Surgery

## 2022-07-23 NOTE — Telephone Encounter (Signed)
Pt called stating she can not take the steroids because it gives her headaches

## 2022-07-23 NOTE — Telephone Encounter (Signed)
I called and spoke with patient. I advised that Dr. Marlou Sa wanted her to have injection in her back and then to follow up with Dr. Laurance Flatten for her back. I advised that Dr. Marlou Sa wanted her to have the injection with Dr. Ernestina Patches but that he was out of the office part of this week and was not able to get her in, so we sent her to [facility] to get it done since they could get her in sooner, I did let her know that whoever is doing the injection should talk to her about it first. I tehn told her that we need to see how much the injection actually helps her so we would need to give it 2 full weeks before she sees Dr. Laurance Flatten, and she states that she understands this. I scheduled her for 07/1322 @ 8:45 am to see Dr. Laurance Flatten.

## 2022-07-23 NOTE — Telephone Encounter (Signed)
Scheduled for 08/12/22 @ 8:45 am, 2 weeks after injection per Dr. Laurance Flatten

## 2022-07-23 NOTE — Telephone Encounter (Signed)
Pt is asking for a call back from [place] to discuss message left. Please call pt at [phone]

## 2022-07-23 NOTE — Telephone Encounter (Signed)
Called Patient to let her know to stop the Prednisone.

## 2022-07-27 ENCOUNTER — Ambulatory Visit: Payer: PPO | Admitting: Orthopedic Surgery

## 2022-07-27 NOTE — Discharge Instructions (Signed)

## 2022-07-28 ENCOUNTER — Ambulatory Visit
Admission: RE | Admit: 2022-07-28 | Discharge: 2022-07-28 | Disposition: A | Discharge status: Home / Self Care | Payer: PPO | Source: Ambulatory Visit | Attending: Orthopedic Surgery | Admitting: Orthopedic Surgery

## 2022-07-28 DIAGNOSIS — M5416 Radiculopathy, lumbar region: Secondary | ICD-10-CM | POA: Diagnosis not present

## 2022-07-28 MED ORDER — IOPAMIDOL (ISOVUE-M 200) INJECTION 41%
1.0000 mL | Freq: Once | INTRAMUSCULAR | Status: AC
  Administered 2022-07-28: 1 mL via EPIDURAL

## 2022-07-28 MED ORDER — METHYLPREDNISOLONE ACETATE 40 MG/ML INJ SUSP (RADIOLOG
80.0000 mg | Freq: Once | INTRAMUSCULAR | Status: AC
  Administered 2022-07-28: 80 mg via EPIDURAL

## 2022-08-05 ENCOUNTER — Other Ambulatory Visit: Payer: Self-pay | Admitting: Family Medicine

## 2022-08-05 NOTE — Telephone Encounter (Signed)
Patients daughter Collier Bullock called and said patient is having a ton of anxiety, She said she really needs the medication this week. She said she has experienced a crisis and she needs as soon as possible. She did state that patient is NOT suicidal. She said she is just laying in the bed. Call back if needed is: Patients # is [phone] and Patients daughters # is [phone]

## 2022-08-06 MED ORDER — ALPRAZOLAM 0.25 MG PO TABS: 0.2500 mg | ORAL_TABLET | Freq: Two times a day (BID) | ORAL | 0 refills | Status: DC | PRN

## 2022-08-06 NOTE — Telephone Encounter (Signed)
I thought I sent this in yesterday, though maybe it did not go through to the pharmacy. I resent it.

## 2022-08-12 ENCOUNTER — Ambulatory Visit: Payer: PPO | Admitting: Orthopedic Surgery

## 2022-08-24 ENCOUNTER — Ambulatory Visit: Payer: Self-pay | Admitting: Urology

## 2022-08-26 ENCOUNTER — Other Ambulatory Visit: Payer: Self-pay | Admitting: Orthopedic Surgery

## 2022-08-26 ENCOUNTER — Other Ambulatory Visit: Payer: Self-pay | Admitting: Family Medicine

## 2022-09-03 ENCOUNTER — Ambulatory Visit: Payer: PPO | Admitting: Orthopedic Surgery

## 2022-09-08 ENCOUNTER — Emergency Department
Admission: EM | Admit: 2022-09-08 | Discharge: 2022-09-09 | Disposition: A | Discharge status: Other Acute Inpatient Hospital | Payer: PPO | Attending: Emergency Medicine | Admitting: Emergency Medicine

## 2022-09-08 ENCOUNTER — Encounter: Payer: Self-pay | Admitting: Emergency Medicine

## 2022-09-08 DIAGNOSIS — I10 Essential (primary) hypertension: Secondary | ICD-10-CM | POA: Insufficient documentation

## 2022-09-08 DIAGNOSIS — Z79899 Other long term (current) drug therapy: Secondary | ICD-10-CM | POA: Diagnosis not present

## 2022-09-08 DIAGNOSIS — M5136 Other intervertebral disc degeneration, lumbar region: Secondary | ICD-10-CM | POA: Diagnosis not present

## 2022-09-08 DIAGNOSIS — F418 Other specified anxiety disorders: Secondary | ICD-10-CM | POA: Diagnosis not present

## 2022-09-08 DIAGNOSIS — Z7952 Long term (current) use of systemic steroids: Secondary | ICD-10-CM | POA: Diagnosis not present

## 2022-09-08 DIAGNOSIS — Z20822 Contact with and (suspected) exposure to covid-19: Secondary | ICD-10-CM | POA: Insufficient documentation

## 2022-09-08 DIAGNOSIS — Z7951 Long term (current) use of inhaled steroids: Secondary | ICD-10-CM | POA: Insufficient documentation

## 2022-09-08 DIAGNOSIS — F331 Major depressive disorder, recurrent, moderate: Secondary | ICD-10-CM | POA: Insufficient documentation

## 2022-09-08 DIAGNOSIS — F419 Anxiety disorder, unspecified: Secondary | ICD-10-CM | POA: Diagnosis present

## 2022-09-08 DIAGNOSIS — Y900 Blood alcohol level of less than 20 mg/100 ml: Secondary | ICD-10-CM | POA: Diagnosis not present

## 2022-09-08 DIAGNOSIS — J45909 Unspecified asthma, uncomplicated: Secondary | ICD-10-CM | POA: Insufficient documentation

## 2022-09-08 DIAGNOSIS — Z853 Personal history of malignant neoplasm of breast: Secondary | ICD-10-CM

## 2022-09-08 DIAGNOSIS — R45851 Suicidal ideations: Secondary | ICD-10-CM | POA: Diagnosis not present

## 2022-09-08 LAB — COMPREHENSIVE METABOLIC PANEL
ALT: 14 U/L (ref 0–44)
AST: 21 U/L (ref 15–41)
Albumin: 3.9 g/dL (ref 3.5–5.0)
Alkaline Phosphatase: 65 U/L (ref 38–126)
Anion gap: 9 (ref 5–15)
BUN: 15 mg/dL (ref 8–23)
CO2: 29 mmol/L (ref 22–32)
Calcium: 8.9 mg/dL (ref 8.9–10.3)
Chloride: 103 mmol/L (ref 98–111)
Creatinine, Ser: 0.72 mg/dL (ref 0.44–1.00)
GFR, Estimated: >60 mL/min (ref >60)
Glucose, Bld: 122 mg/dL — ABNORMAL HIGH (ref 70–99)
Potassium: 3.3 mmol/L — ABNORMAL LOW (ref 3.5–5.1)
Sodium: 141 mmol/L (ref 135–145)
Total Bilirubin: 0.6 mg/dL (ref 0.3–1.2)
Total Protein: 7 g/dL (ref 6.5–8.1)

## 2022-09-08 LAB — CBC
HCT: 36.9 % (ref 36.0–46.0)
Hemoglobin: 11.9 g/dL — ABNORMAL LOW (ref 12.0–15.0)
MCH: 31.2 pg (ref 26.0–34.0)
MCHC: 32.2 g/dL (ref 30.0–36.0)
MCV: 96.6 fL (ref 80.0–100.0)
Platelets: 213 10*3/uL (ref 150–400)
RBC: 3.82 MIL/uL — ABNORMAL LOW (ref 3.87–5.11)
RDW: 13.6 % (ref 11.5–15.5)
WBC: 7.2 10*3/uL (ref 4.0–10.5)
nRBC: 0 % (ref 0.0–0.2)

## 2022-09-08 LAB — TSH: TSH: 1.901 u[IU]/mL (ref 0.350–4.500)

## 2022-09-08 LAB — ACETAMINOPHEN LEVEL: Acetaminophen (Tylenol), Serum: <10 ug/mL — ABNORMAL LOW (ref 10–30)

## 2022-09-08 LAB — ETHANOL: Alcohol, Ethyl (B): <10 mg/dL (ref <10)

## 2022-09-08 LAB — SALICYLATE LEVEL: Salicylate Lvl: <7 mg/dL — ABNORMAL LOW (ref 7.0–30.0)

## 2022-09-08 MED ORDER — ALPRAZOLAM 0.5 MG PO TABS
0.5000 mg | ORAL_TABLET | Freq: Two times a day (BID) | ORAL | Status: DC
  Filled 2022-09-08: qty 1

## 2022-09-08 MED ORDER — ALBUTEROL SULFATE (2.5 MG/3ML) 0.083% IN NEBU: 2.5000 mg | INHALATION_SOLUTION | Freq: Four times a day (QID) | RESPIRATORY_TRACT | Status: DC | PRN

## 2022-09-08 MED ORDER — SUMATRIPTAN SUCCINATE 50 MG PO TABS: 50.0000 mg | ORAL_TABLET | ORAL | Status: DC | PRN

## 2022-09-08 MED ORDER — PANTOPRAZOLE SODIUM 40 MG PO TBEC: 40.0000 mg | DELAYED_RELEASE_TABLET | Freq: Every day | ORAL | Status: DC

## 2022-09-08 MED ORDER — ROSUVASTATIN CALCIUM 20 MG PO TABS
40.0000 mg | ORAL_TABLET | Freq: Every day | ORAL | Status: DC
  Administered 2022-09-08: 40 mg via ORAL
  Filled 2022-09-08 (×2): qty 2

## 2022-09-08 MED ORDER — FLUOXETINE HCL 20 MG PO CAPS: 60.0000 mg | ORAL_CAPSULE | Freq: Every day | ORAL | Status: DC

## 2022-09-08 MED ORDER — CELECOXIB 100 MG PO CAPS
100.0000 mg | ORAL_CAPSULE | Freq: Two times a day (BID) | ORAL | Status: DC
  Administered 2022-09-08: 100 mg via ORAL
  Filled 2022-09-08 (×2): qty 1

## 2022-09-08 MED ORDER — ALPRAZOLAM 0.5 MG PO TABS
0.2500 mg | ORAL_TABLET | Freq: Two times a day (BID) | ORAL | Status: DC | PRN
  Administered 2022-09-08: 0.25 mg via ORAL

## 2022-09-08 MED ORDER — LORATADINE 10 MG PO TABS: 10.0000 mg | ORAL_TABLET | Freq: Every day | ORAL | Status: DC

## 2022-09-08 MED ORDER — HYDROCHLOROTHIAZIDE 25 MG PO TABS: 25.0000 mg | ORAL_TABLET | Freq: Every day | ORAL | Status: DC

## 2022-09-08 MED ORDER — TRAZODONE HCL 50 MG PO TABS
50.0000 mg | ORAL_TABLET | Freq: Every day | ORAL | Status: DC
  Filled 2022-09-08: qty 1

## 2022-09-08 MED ORDER — TRAZODONE HCL 50 MG PO TABS
50.0000 mg | ORAL_TABLET | Freq: Every evening | ORAL | Status: DC | PRN
  Administered 2022-09-08: 50 mg via ORAL

## 2022-09-08 NOTE — ED Notes (Signed)
Patient states she has been having a lot going on with her children. Patient states her children are in conflict with each other and she is in the middle.  She states son is mad at her and she feel like there is nothing she can do to fix the problems. Patient is tearful and visually upset.

## 2022-09-08 NOTE — ED Triage Notes (Signed)
Pt presents ambulatory to triage via POV with complaints of SI for the last week. Pt endorses depression and a lot going on. Pt tearful in triage but is cooperative. Endorses chronic low back pain. A&Ox4 at this time. Denies CP or SOB.

## 2022-09-08 NOTE — ED Notes (Signed)
Pt dressed out by this RN and EDT Misty Stanley) belongings include:  Wallace Cullens and white shirt White shoes Tan pants Black bag White jacket

## 2022-09-08 NOTE — ED Provider Notes (Signed)
[facility] Provider Note    Event Date/Time   First MD Initiated Contact with Patient 09/08/22 2107     (approximate)   History   Suicidal   HPI  Dana Obrien is a 77 y.o. female past medical history significant for depression, hypertension, hyperlipidemia, who presents to the emergency department with feeling of hopelessness and suicidal ideation.  States that she has had multiple life stressors at home with family members.  States that over the past week she has had worsening thoughts of suicidal ideation and feels like there is no longer anything to live for.  No prior suicide attempts in the past.  States that she takes Prozac for depression and has been compliant with it.  Currently living with her son, his wife, her daughter and grandson.  Denies any alcohol or drug use.  Denies any firearms in the home.     Physical Exam   Triage Vital Signs: ED Triage Vitals [09/08/22 2054]  Enc Vitals Group     BP (!) 164/75     Pulse Rate 75     Resp 20     Temp 98.2 F (36.8 C)     Temp Source Oral     SpO2 100 %     Weight 190 lb (86.2 kg)     Height 5\' 4"  (1.626 m)     Head Circumference      Peak Flow      Pain Score 0     Pain Loc      Pain Edu?      Excl. in GC?     Most recent vital signs: Vitals:   09/08/22 2054  BP: (!) 164/75  Pulse: 75  Resp: 20  Temp: 98.2 F (36.8 C)  SpO2: 100%    Physical Exam Constitutional:      Appearance: She is well-developed.  HENT:     Head: Atraumatic.  Eyes:     Conjunctiva/sclera: Conjunctivae normal.  Cardiovascular:     Rate and Rhythm: Regular rhythm.  Pulmonary:     Effort: No respiratory distress.  Abdominal:     General: There is no distension.  Musculoskeletal:        General: Normal range of motion.     Cervical back: Normal range of motion.  Skin:    General: Skin is warm.  Neurological:     Mental Status: She is alert. Mental status is at baseline.  Psychiatric:         Mood and Affect: Mood is depressed. Affect is tearful.        Behavior: Behavior is cooperative.        Thought Content: Thought content includes suicidal ideation. Thought content does not include suicidal plan.     IMPRESSION / MDM / ASSESSMENT AND PLAN / ED COURSE  I reviewed the triage vital signs and the nursing notes.  Differential diagnosis including depression, suicidal ideation, thyroid disorder, electrolyte abnormality  Patient currently here voluntarily.    LABS (all labs ordered are listed, but only abnormal results are displayed) Labs interpreted as -    Labs Reviewed  COMPREHENSIVE METABOLIC PANEL - Abnormal; Notable for the following components:      Result Value   Potassium 3.3 (*)    Glucose, Bld 122 (*)    All other components within normal limits  CBC - Abnormal; Notable for the following components:   RBC 3.82 (*)    Hemoglobin 11.9 (*)  All other components within normal limits  ETHANOL  SALICYLATE LEVEL  ACETAMINOPHEN LEVEL  URINE DRUG SCREEN, QUALITATIVE ([facility] ONLY)  TSH    MDM  Mild hypokalemia.  Anemia but does not meet criteria for transfusion and no symptoms of a GI bleed.  Consulted psychiatry for evaluation for suicidal ideation and depression. Clinical Course as of 09/08/22 2251  Tue Sep 08, 2022  2251 Plan to admit inpatient - geripsych [SM]    Clinical Course User Index [SM] Corena Herter, MD     The patient has been placed in psychiatric observation due to the need to provide a safe environment for the patient while obtaining psychiatric consultation and evaluation, as well as ongoing medical and medication management to treat the patient's condition.  The patient has not been placed under full IVC at this time.   PROCEDURES:  Critical Care performed: No  Procedures  Patient's presentation is most consistent with acute presentation with potential threat to life or bodily function.   MEDICATIONS ORDERED IN  ED: Medications  celecoxib (CELEBREX) capsule 100 mg (has no administration in time range)  loratadine (CLARITIN) tablet 10 mg (has no administration in time range)  hydrochlorothiazide (HYDRODIURIL) tablet 25 mg (has no administration in time range)  pantoprazole (PROTONIX) EC tablet 40 mg (has no administration in time range)  rosuvastatin (CRESTOR) tablet 40 mg (has no administration in time range)    FINAL CLINICAL IMPRESSION(S) / ED DIAGNOSES   Final diagnoses:  Suicidal ideation     Rx / DC Orders   ED Discharge Orders     None        Note:  This document was prepared using Dragon voice recognition software and may include unintentional dictation errors.   Corena Herter, MD 09/08/22 2148

## 2022-09-09 ENCOUNTER — Encounter: Payer: Self-pay | Admitting: Psychiatry

## 2022-09-09 ENCOUNTER — Inpatient Hospital Stay
Admission: AD | Admit: 2022-09-09 | Discharge: 2022-09-16 | DRG: 885 | Disposition: A | Discharge status: Home / Self Care | Payer: PPO | Source: Ambulatory Visit | Attending: Psychiatry | Admitting: Psychiatry

## 2022-09-09 ENCOUNTER — Other Ambulatory Visit: Payer: Self-pay

## 2022-09-09 DIAGNOSIS — Z823 Family history of stroke: Secondary | ICD-10-CM

## 2022-09-09 DIAGNOSIS — Z9221 Personal history of antineoplastic chemotherapy: Secondary | ICD-10-CM | POA: Diagnosis not present

## 2022-09-09 DIAGNOSIS — Z888 Allergy status to other drugs, medicaments and biological substances status: Secondary | ICD-10-CM | POA: Diagnosis not present

## 2022-09-09 DIAGNOSIS — Z8249 Family history of ischemic heart disease and other diseases of the circulatory system: Secondary | ICD-10-CM | POA: Diagnosis not present

## 2022-09-09 DIAGNOSIS — Z820 Family history of epilepsy and other diseases of the nervous system: Secondary | ICD-10-CM | POA: Diagnosis not present

## 2022-09-09 DIAGNOSIS — Z923 Personal history of irradiation: Secondary | ICD-10-CM

## 2022-09-09 DIAGNOSIS — K219 Gastro-esophageal reflux disease without esophagitis: Secondary | ICD-10-CM | POA: Diagnosis present

## 2022-09-09 DIAGNOSIS — Z8261 Family history of arthritis: Secondary | ICD-10-CM

## 2022-09-09 DIAGNOSIS — J45909 Unspecified asthma, uncomplicated: Secondary | ICD-10-CM | POA: Diagnosis present

## 2022-09-09 DIAGNOSIS — F322 Major depressive disorder, single episode, severe without psychotic features: Secondary | ICD-10-CM | POA: Diagnosis not present

## 2022-09-09 DIAGNOSIS — I10 Essential (primary) hypertension: Secondary | ICD-10-CM | POA: Diagnosis not present

## 2022-09-09 DIAGNOSIS — G4733 Obstructive sleep apnea (adult) (pediatric): Secondary | ICD-10-CM | POA: Diagnosis not present

## 2022-09-09 DIAGNOSIS — F331 Major depressive disorder, recurrent, moderate: Secondary | ICD-10-CM | POA: Diagnosis present

## 2022-09-09 DIAGNOSIS — Z885 Allergy status to narcotic agent status: Secondary | ICD-10-CM

## 2022-09-09 DIAGNOSIS — Z808 Family history of malignant neoplasm of other organs or systems: Secondary | ICD-10-CM

## 2022-09-09 DIAGNOSIS — Z83438 Family history of other disorder of lipoprotein metabolism and other lipidemia: Secondary | ICD-10-CM

## 2022-09-09 DIAGNOSIS — Z79899 Other long term (current) drug therapy: Secondary | ICD-10-CM | POA: Diagnosis not present

## 2022-09-09 DIAGNOSIS — F410 Panic disorder [episodic paroxysmal anxiety] without agoraphobia: Secondary | ICD-10-CM | POA: Diagnosis not present

## 2022-09-09 DIAGNOSIS — Z853 Personal history of malignant neoplasm of breast: Secondary | ICD-10-CM | POA: Diagnosis not present

## 2022-09-09 DIAGNOSIS — Z791 Long term (current) use of non-steroidal anti-inflammatories (NSAID): Secondary | ICD-10-CM | POA: Diagnosis not present

## 2022-09-09 DIAGNOSIS — R45851 Suicidal ideations: Secondary | ICD-10-CM | POA: Diagnosis present

## 2022-09-09 LAB — SARS CORONAVIRUS 2 BY RT PCR: SARS Coronavirus 2 by RT PCR: NEGATIVE

## 2022-09-09 MED ORDER — LORATADINE 10 MG PO TABS
10.0000 mg | ORAL_TABLET | Freq: Every day | ORAL | Status: DC
  Administered 2022-09-09 – 2022-09-16 (×8): 10 mg via ORAL
  Filled 2022-09-09 (×8): qty 1

## 2022-09-09 MED ORDER — HYDROCHLOROTHIAZIDE 25 MG PO TABS
25.0000 mg | ORAL_TABLET | Freq: Every day | ORAL | Status: DC
  Administered 2022-09-09 – 2022-09-14 (×6): 25 mg via ORAL
  Filled 2022-09-09 (×7): qty 1

## 2022-09-09 MED ORDER — TRAZODONE HCL 50 MG PO TABS
50.0000 mg | ORAL_TABLET | Freq: Every day | ORAL | Status: DC
  Administered 2022-09-09 – 2022-09-15 (×7): 50 mg via ORAL
  Filled 2022-09-09 (×7): qty 1

## 2022-09-09 MED ORDER — ALBUTEROL SULFATE (2.5 MG/3ML) 0.083% IN NEBU: 2.5000 mg | INHALATION_SOLUTION | Freq: Four times a day (QID) | RESPIRATORY_TRACT | Status: DC | PRN

## 2022-09-09 MED ORDER — FLUOXETINE HCL 20 MG PO CAPS
60.0000 mg | ORAL_CAPSULE | Freq: Every day | ORAL | Status: DC
  Administered 2022-09-09 – 2022-09-16 (×8): 60 mg via ORAL
  Filled 2022-09-09 (×8): qty 3

## 2022-09-09 MED ORDER — SENNA 8.6 MG PO TABS
1.0000 | ORAL_TABLET | Freq: Every day | ORAL | Status: DC
  Administered 2022-09-09 – 2022-09-16 (×8): 8.6 mg via ORAL
  Filled 2022-09-09 (×8): qty 1

## 2022-09-09 MED ORDER — TRAMADOL HCL 50 MG PO TABS: 50.0000 mg | ORAL_TABLET | Freq: Four times a day (QID) | ORAL | Status: DC | PRN

## 2022-09-09 MED ORDER — CELECOXIB 100 MG PO CAPS
100.0000 mg | ORAL_CAPSULE | Freq: Two times a day (BID) | ORAL | Status: DC
  Administered 2022-09-09 – 2022-09-16 (×15): 100 mg via ORAL
  Filled 2022-09-09 (×16): qty 1

## 2022-09-09 MED ORDER — PANTOPRAZOLE SODIUM 40 MG PO TBEC
40.0000 mg | DELAYED_RELEASE_TABLET | Freq: Every day | ORAL | Status: DC
  Administered 2022-09-09 – 2022-09-16 (×8): 40 mg via ORAL
  Filled 2022-09-09 (×8): qty 1

## 2022-09-09 MED ORDER — ACETAMINOPHEN 325 MG PO TABS
650.0000 mg | ORAL_TABLET | Freq: Four times a day (QID) | ORAL | Status: DC | PRN
  Administered 2022-09-09 – 2022-09-14 (×5): 650 mg via ORAL
  Filled 2022-09-09 (×4): qty 2

## 2022-09-09 MED ORDER — MAGNESIUM HYDROXIDE 400 MG/5ML PO SUSP: 30.0000 mL | Freq: Every day | ORAL | Status: DC | PRN

## 2022-09-09 MED ORDER — FLUTICASONE PROPIONATE 50 MCG/ACT NA SUSP
1.0000 | Freq: Every day | NASAL | Status: DC
  Administered 2022-09-09 – 2022-09-16 (×9): 1 via NASAL
  Filled 2022-09-09: qty 16

## 2022-09-09 MED ORDER — ALUM & MAG HYDROXIDE-SIMETH 200-200-20 MG/5ML PO SUSP: 30.0000 mL | ORAL | Status: DC | PRN

## 2022-09-09 MED ORDER — ROSUVASTATIN CALCIUM 20 MG PO TABS
40.0000 mg | ORAL_TABLET | Freq: Every day | ORAL | Status: DC
  Administered 2022-09-09 – 2022-09-16 (×8): 40 mg via ORAL
  Filled 2022-09-09: qty 8
  Filled 2022-09-09 (×3): qty 2
  Filled 2022-09-09 (×2): qty 8
  Filled 2022-09-09 (×2): qty 2

## 2022-09-09 MED ORDER — SUMATRIPTAN SUCCINATE 50 MG PO TABS
50.0000 mg | ORAL_TABLET | ORAL | Status: DC | PRN
  Administered 2022-09-09 – 2022-09-15 (×5): 50 mg via ORAL
  Filled 2022-09-09 (×5): qty 1

## 2022-09-09 MED ORDER — ALPRAZOLAM 0.5 MG PO TABS
0.2500 mg | ORAL_TABLET | Freq: Three times a day (TID) | ORAL | Status: DC | PRN
  Administered 2022-09-09 – 2022-09-15 (×8): 0.25 mg via ORAL
  Filled 2022-09-09 (×8): qty 1

## 2022-09-09 MED ORDER — METHOCARBAMOL 500 MG PO TABS
500.0000 mg | ORAL_TABLET | Freq: Four times a day (QID) | ORAL | Status: DC | PRN
  Administered 2022-09-09 – 2022-09-15 (×3): 500 mg via ORAL
  Filled 2022-09-09 (×3): qty 1

## 2022-09-09 NOTE — ED Provider Notes (Signed)
-----------------------------------------   5:10 AM on 09/09/2022 ----------------------------------------- Today's Vitals   09/08/22 2054  BP: (!) 164/75  Pulse: 75  Resp: 20  Temp: 98.2 F (36.8 C)  TempSrc: Oral  SpO2: 100%  Weight: 86.2 kg  Height: 5\' 4"  (1.626 m)  PainSc: 0-No pain   Body mass index is 32.61 kg/m.  Patient currently complaining of headache similar to prior migraines, will give usual dose of her Imitrex.  Psychiatric admission is pending at this time.   Chesley Noon, MD 09/09/22 [phone]

## 2022-09-09 NOTE — ED Notes (Signed)
Pt is accepted to [facility] 09/09/22 .Marland Kitchen

## 2022-09-09 NOTE — ED Notes (Signed)
ivc/consult done/recommend psychiatric inpatient admission when medically cleared.. 

## 2022-09-09 NOTE — BH Assessment (Signed)
Comprehensive Clinical Assessment (CCA) Note  09/09/2022 Dana Obrien [id number] Recommendations for Services/Supports/Treatments: Psych NP Madaline Brilliant. determined pt. meets psychiatric inpatient criteria.  Dana Obrien is a 77 year old, English speaking, Caucasian female with a hx of anxiety and depression. Pt presented to [facility] ED voluntarily.   On assessment, pt. perseverated about her daughter's failed marriage and alcoholism. Pt had soft speech. Pt's thoughts were relevant and linear. When asked what brought pt. to the ED the pt. reported feeling that she could not take it anymore! Pt began obsessing about the discord between her son and daughter, since she moved her daughter back into the family home. Pt became tearful and describing her stressors. Pt reported having depression and having passive SI for the past week. Pt had poor insight and impaired judgment. Pt had a tearful affect, and a depressed mood. Pt had poor concentration and normal motor activity. Pt was alert and oriented x3. Pt continued to endorse passive SI, as she stated that she does not have a plan but wishes that God would come and take her. Pt denied HI or AV/H. BAL unremarkable.  Chief Complaint:  Chief Complaint  Patient presents with   Suicidal   Visit Diagnosis: MDD, severe without psychosis    CCA Screening, Triage and Referral (STR)  Patient Reported Information How did you hear about [place]? Self  Referral name: No data recorded Referral phone number: No data recorded  Whom do you see for routine medical problems? No data recorded Practice/Facility Name: No data recorded Practice/Facility Phone Number: No data recorded Name of Contact: No data recorded Contact Number: No data recorded Contact Fax Number: No data recorded Prescriber Name: No data recorded Prescriber Address (if known): No data recorded  What Is the Reason for Your Visit/Call Today? Pt presents ambulatory to triage via POV with  complaints of SI for the last week. Pt endorses depression and a lot going on.  How Long Has This Been Causing You Problems? 1 wk - 1 month  What Do You Feel Would Help You the Most Today? Treatment for Depression or other mood problem; Stress Management   Have You Recently Been in Any Inpatient Treatment (Hospital/Detox/Crisis Center/28-Day Program)? No data recorded Name/Location of Program/Hospital:No data recorded How Long Were You There? No data recorded When Were You Discharged? No data recorded  Have You Ever Received Services From [facility] Before? No data recorded Who Do You See at [facility]? No data recorded  Have You Recently Had Any Thoughts About Hurting Yourself? Yes  Are You Planning to Commit Suicide/Harm Yourself At This time? No   Have you Recently Had Thoughts About Hurting Someone Karolee Ohs? No  Explanation: n/a   Have You Used Any Alcohol or Drugs in the Past 24 Hours? No  How Long Ago Did You Use Drugs or Alcohol? No data recorded What Did You Use and How Much? n/a   Do You Currently Have a Therapist/Psychiatrist? No  Name of Therapist/Psychiatrist: n/a   Have You Been Recently Discharged From Any Office Practice or Programs? No  Explanation of Discharge From Practice/Program: n/a     CCA Screening Triage Referral Assessment Type of Contact: Face-to-Face  Is this Initial or Reassessment? No data recorded Date Telepsych consult ordered in [facility]:  No data recorded Time Telepsych consult ordered in [facility]:  No data recorded  Patient Reported Information Reviewed? No data recorded Patient Left Without Being Seen? No data recorded Reason for Not Completing Assessment: No data recorded  Collateral  Involvement: None provided   Does Patient Have a Court Appointed Legal Guardian? No data recorded Name and Contact of Legal Guardian: No data recorded If Minor and Not Living with Parent(s), Who has Custody? n/a  Is CPS involved or ever been involved?  Never  Is APS involved or ever been involved? Never   Patient Determined To Be At Risk for Harm To Self or Others Based on Review of Patient Reported Information or Presenting Complaint? Yes, for Self-Harm  Method: No Plan  Availability of Means: No access or NA  Intent: Vague intent or NA  Notification Required: No need or identified person  Additional Information for Danger to Others Potential: -- (n/a)  Additional Comments for Danger to Others Potential: n/a  Are There Guns or Other Weapons in Your Home? No  Types of Guns/Weapons: n/a (n/a)  Are These Weapons Safely Secured?                            No  Who Could Verify You Are Able To Have These Secured: n/a  Do You Have any Outstanding Charges, Pending Court Dates, Parole/Probation? n/a  Contacted To Inform of Risk of Harm To Self or Others: Other: Comment   [facility]   Does Patient Present under Involuntary Commitment? No  IVC Papers Initial File Date: No data recorded  [place]   Patient Currently Receiving the Following Services: Not Receiving Services   Determination of Need: Emergent (2 hours)   Options For Referral: Inpatient Hospitalization     CCA Biopsychosocial Intake/Chief Complaint:  No data recorded Current Symptoms/Problems: No data recorded  Patient Reported Schizophrenia/Schizoaffective Diagnosis in Past: No   Strengths: Pt has a treatable diagnosis; pt has good insight and is receptive to treatment.  Preferences: No data recorded Abilities: No data recorded  Type of Services Patient Feels are Needed: No data recorded  Initial Clinical Notes/Concerns: No data recorded  Mental Health Symptoms Depression:   Hopelessness; Tearfulness; Fatigue; Worthlessness   Duration of Depressive symptoms:  Greater than two weeks   Mania:   None   Anxiety:    Tension; Worrying   Psychosis:   None   Duration of Psychotic symptoms: No  data recorded  Trauma:   N/A   Obsessions:   Cause anxiety; Disrupts routine/functioning; Intrusive/time consuming; Recurrent & persistent thoughts/impulses/images   Compulsions:   Repeated behaviors/mental acts; Disrupts with routine/functioning; Intrusive/time consuming   Inattention:   None   Hyperactivity/Impulsivity:   None   Oppositional/Defiant Behaviors:   None   Emotional Irregularity:   N/A   Other Mood/Personality Symptoms:  No data recorded   Mental Status Exam Appearance and self-care  Stature:   Average   Weight:   Overweight   Clothing:   Age-appropriate   Grooming:   Neglected   Cosmetic use:   None   Posture/gait:   Normal   Motor activity:   Not Remarkable   Sensorium  Attention:   Normal   Concentration:   Anxiety interferes   Orientation:   Object; Person; Situation; Place   Recall/memory:   Normal   Affect and Mood  Affect:   Tearful   Mood:   Depressed   Relating  Eye contact:   Normal   Facial expression:   Depressed   Attitude toward examiner:   Cooperative   Thought and Language  Speech flow:  Clear and Coherent   Thought content:  Appropriate to Mood and Circumstances   Preoccupation:   Obsessions   Hallucinations:   None   Organization:  No data recorded  [employer] of Knowledge:   Average   Intelligence:   Average   Abstraction:   Functional   Judgement:   Poor   Reality Testing:   Distorted   Insight:   Lacking   Decision Making:   Normal   Social Functioning  Social Maturity:   Irresponsible   Social Judgement:   Victimized   Stress  Stressors:   Family conflict; Grief/losses   Coping Ability:   Overwhelmed   Skill Deficits:   Self-care; Interpersonal   Supports:   Support needed     Religion: Religion/Spirituality Are You A Religious Person?:  (UTA) How Might This Affect Treatment?: UTA  Leisure/Recreation: Leisure / Recreation Do  You Have Hobbies?: No  Exercise/Diet: Exercise/Diet Do You Exercise?: No Have You Gained or Lost A Significant Amount of Weight in the Past Six Months?: No Do You Follow a Special Diet?: No Do You Have Any Trouble Sleeping?: No   CCA Employment/Education Employment/Work Situation: Employment / Work Academic librarian Situation: Retired Passenger transport manager has Been Impacted by Current Illness: No Has Patient ever Been in Equities trader?: No  Education: Education Is Patient Currently Attending School?: No Did Theme park manager?: No Did You Have An Individualized Education Program (IIEP): No Did You Have Any Difficulty At [employer]?: No Patient's Education Has Been Impacted by Current Illness: No   CCA Family/Childhood History Family and Relationship History: Family history Marital status: Widowed Widowed, when?: 2017 Does patient have children?: Yes How many children?: 2 How is patient's relationship with their children?: Pt has strained relationship with her sone and a codependent relationship with her daughter  Childhood History:  Childhood History By whom was/is the patient raised?: Mother Did patient suffer any verbal/emotional/physical/sexual abuse as a child?: No Did patient suffer from severe childhood neglect?: No Has patient ever been sexually abused/assaulted/raped as an adolescent or adult?: No Was the patient ever a victim of a crime or a disaster?: No Witnessed domestic violence?: No Has patient been affected by domestic violence as an adult?: No  Child/Adolescent Assessment:     CCA Substance Use Alcohol/Drug Use: Alcohol / Drug Use Pain Medications: See MAR Prescriptions: See MAR Over the Counter: See MAR History of alcohol / drug use?: No history of alcohol / drug abuse                         ASAM's:  Six Dimensions of Multidimensional Assessment  Dimension 1:  Acute Intoxication and/or Withdrawal Potential:      Dimension 2:  Biomedical  Conditions and Complications:      Dimension 3:  Emotional, Behavioral, or Cognitive Conditions and Complications:     Dimension 4:  Readiness to Change:     Dimension 5:  Relapse, Continued use, or Continued Problem Potential:     Dimension 6:  Recovery/Living Environment:     ASAM Severity Score:    ASAM Recommended Level of Treatment:     Substance use Disorder (SUD)    Recommendations for Services/Supports/Treatments:    DSM5 Diagnoses: Patient Active Problem List   Diagnosis Date Noted   Hydronephrosis 07/21/2022   Left lower quadrant pain 04/20/2022   Pustule 12/09/2021   Tick bite of right upper arm 12/09/2021   Urge incontinence 07/11/2021   Right knee pain 11/08/2020   Concussion with  no loss of consciousness 08/08/2020   Vitamin D deficiency 08/08/2020   Gastroesophageal reflux disease without esophagitis 03/25/2020   Dermatitis due to plants, including poison ivy, sumac, and oak 02/29/2020   Neuropathy 12/15/2019   Allergic rhinitis 07/05/2019   Brain atrophy 05/18/2019   Bilateral hearing loss 08/15/2018   Decreased energy 08/15/2018   Prediabetes 06/02/2018   Arthralgia 06/02/2018   BPPV (benign paroxysmal positional vertigo), right 03/11/2018   Dysfunction of right eustachian tube 03/11/2018   Malignant neoplasm of upper-outer quadrant of left breast in female, 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   Fatty liver 08/24/2017   Hearing difficulty of both ears 06/25/2017   Bilateral sensorineural hearing loss 10/13/2016   Right ear impacted cerumen 10/13/2016   GERD (gastroesophageal reflux disease) 09/07/2016   Uterine prolapse 11/14/2015   OSA (obstructive sleep apnea) 11/01/2015   Back muscle spasm 04/30/2015   DDD (degenerative disc disease), lumbar 04/30/2015   Lumbar radiculitis 04/30/2015   Low back pain 01/01/2015   Varicose vein of leg 11/22/2014   Increased frequency of urination 11/17/2013   Rectocele 11/17/2013   Cystocele 10/25/2013   Skin  lesion 10/25/2013   Disorder of skin and subcutaneous tissue 10/25/2013   Midline cystocele 10/25/2013   Obesity, unspecified 10/19/2012   History of breast cancer 02/11/2012   Hyperlipidemia 05/11/2011   Osteopenia 05/11/2011   Disorder of bone and cartilage 05/11/2011   Anxiety and depression 03/04/2011   Hypertension 03/04/2011   Shannan Garfinkel R Jomari Bartnik, LCAS

## 2022-09-09 NOTE — Group Note (Signed)
LCSW Group Therapy Note  Group Date: 09/09/2022 Start Time: 1315 End Time: 1350   Type of Therapy and Topic:  Group Therapy: Anger Cues and Responses  Participation Level:  Did Not Attend   Description of Group:   In this group, patients learned how to recognize the physical, cognitive, emotional, and behavioral responses they have to anger-provoking situations.  They identified a recent time they became angry and how they reacted.  They analyzed how their reaction was possibly beneficial and how it was possibly unhelpful.  The group discussed a variety of healthier coping skills that could help with such a situation in the future.  Focus was placed on how helpful it is to recognize the underlying emotions to our anger, because working on those can lead to a more permanent solution as well as our ability to focus on the important rather than the urgent.  Therapeutic Goals: Patients will remember their last incident of anger and how they felt emotionally and physically, what their thoughts were at the time, and how they behaved. Patients will identify how their behavior at that time worked for them, as well as how it worked against them. Patients will explore possible new behaviors to use in future anger situations. Patients will learn that anger itself is normal and cannot be eliminated, and that healthier reactions can assist with resolving conflict rather than worsening situations.  Summary of Patient Progress: Patient did not attend group though encouraged by this clinician.  Therapeutic Modalities:   Cognitive Behavioral Therapy    Claudie Fisherman 09/09/2022  1:59 PM

## 2022-09-09 NOTE — Group Note (Signed)
Date:  09/09/2022 Time:  8:36 PM  Group Topic/Focus:  Building Self Esteem:   The Focus of this group is helping patients become aware of the effects of self-esteem on their lives, the things they and others do that enhance or undermine their self-esteem, seeing the relationship between their level of self-esteem and the choices they make and learning ways to enhance self-esteem.    Participation Level:  Minimal  Participation Quality:  Attentive  Affect:  Tearful  Cognitive:  Alert  Insight: Limited  Engagement in Group:  Limited  Modes of Intervention:  Rapport Building  Additional Comments:  Although pt did not have many words, participation was appropriate.   Garry Heater 09/09/2022, 8:36 PM

## 2022-09-09 NOTE — Progress Notes (Signed)
Admission Note:   77 yr female who presents IVC in no acute distress for the treatment of SI and Depression. Pt appears flat and depressed. Pt was calm and cooperative with admission process. Pt presents with passive SI and contracts for safety upon admission. Pt denies AVH . Pt states she is experiencing high level of stress at home with her family. Pt has Past medical Hx of HTN, Depression and anxiety . Skin was assessed and the following noted: Rt AC IV mark, Rt and Lt wrist scratches, mole on upper mid back, and varicose veins noted bilaterally on each lower extremity.  PT searched and no contraband found, POC and unit policies explained and understanding verbalized. Consents obtained. Food and fluids offered, and fluids accepted. Pt had no additional questions or concerns

## 2022-09-09 NOTE — Consult Note (Cosign Needed Addendum)
[facility] Face-to-Face Psychiatry Consult   Reason for Consult:Suicidal  Referring Physician: Dr. Arnoldo Morale Patient Identification: Dana Obrien MRN:  [id number] Principal Diagnosis: <principal problem not specified> Diagnosis:  Active Problems:   Anxiety and depression   History of breast cancer   DDD (degenerative disc disease), lumbar   MDD (major depressive disorder), recurrent episode, moderate   Total Time spent with patient: 1 hour  Subjective: "I don't know what to do." Dana Obrien is a 77 y.o. female patient presented to [facility] ED via POV under involuntary commitment status (IVC). She expresses overwhelming feelings of being unable to cope, particularly regarding family stressors such as her daughter's failed marriage and alcoholism. She demonstrates soft speech and exhibits relevant and linear thoughts. Tearfulness accompanies discussions of her stressors, consistent with depression. The patient reports passive suicidal ideation for the past week, expressing a desire for divine intervention to relieve her suffering, though she denies having a specific plan. Poor insight and impaired judgment are noted. She presents with a tearful affect and a depressed mood, along with poor concentration but normal motor activity. The patient is alert and oriented x3, denying hallucinations.  Assessment and Plan: The patient's presentation indicates significant distress and depressive symptoms, warranting immediate intervention for safety and support. Psychiatric evaluation and intervention are indicated, and collaboration with psychiatric services for comprehensive assessment and treatment planning is necessary. Close monitoring and safety precautions should be implemented due to the patient's passive suicidal ideation. Social work involvement is recommended to address family dynamics and provide additional support. Close follow-up arrangements should be made to ensure continuity of care upon discharge,  including consideration of outpatient therapy and medication management.  On evaluation, the patient is alert and oriented x4, displaying anxiety and emotional distress but cooperates with assessment. Mood is congruent with affect. There is no evidence of response to internal or external stimuli, delusional thinking, auditory or visual hallucinations, suicidal, homicidal, or self-harm ideations, or psychotic or paranoid behaviors noted. The patient answers questions appropriately. Collaboration with Dr. Arnoldo Morale occurred regarding the patient's care and need for admission to the psychiatric inpatient unit.   HPI:Per Dr. Arnoldo Morale, Dana Obrien is a 77 y.o. female past medical history significant for depression, hypertension, hyperlipidemia, who presents to the emergency department with feeling of hopelessness and suicidal ideation.  States that she has had multiple life stressors at home with family members.  States that over the past week she has had worsening thoughts of suicidal ideation and feels like there is no longer anything to live for.  No prior suicide attempts in the past.  States that she takes Prozac for depression and has been compliant with it.  Currently living with her son, his wife, her daughter and grandson.  Denies any alcohol or drug use.  Denies any firearms in the home.   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   Cancer 2004   right lumpectomy, chemotherapy and radiation Dr. Koleen Nimrod   CPAP (continuous positive airway pressure) dependence    Depression  GERD (gastroesophageal  reflux disease)    Hypertension    Migraine    Personal history of chemotherapy 2004   Personal history of radiation therapy 2004   Personal history of radiation therapy 2019   PONV (postoperative nausea and vomiting)    Shingles    Sleep apnea    uses CPAP, severe OSA   Vitamin D deficiency    IN THE PAST    Past Surgical History:  Procedure Laterality Date   ABDOMINAL HYSTERECTOMY     ANTERIOR AND POSTERIOR REPAIR     BREAST BIOPSY Right 2004   stereo. 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  3.5 cm maximum diameter, minimal margins 5 mm.  In situ component less than 10%.   BREAST LUMPECTOMY Right 2004   BREAST LUMPECTOMY Left 09/17/2017   Procedure: BREAST EXCISION, SENTINEL NODE BIOPSY;  Surgeon: Earline Mayotte, MD;  Location: [facility] ORS;  Service: General;  Laterality: Left;   COLONOSCOPY  2015   COLONOSCOPY WITH PROPOFOL N/A 10/04/2019   Procedure: COLONOSCOPY WITH PROPOFOL;  Surgeon: Earline Mayotte, MD;  Location: [facility] ENDOSCOPY;  Service: Endoscopy;  Laterality: N/A;   DILATION AND CURETTAGE OF UTERUS     ETHMOIDECTOMY Bilateral 11/05/2016   Procedure: ETHMOIDECTOMY;  Surgeon: Christia Reading, MD;  Location: [facility] OR;  Service: ENT;  Laterality: Bilateral;   LAPAROTOMY     MAXILLARY ANTROSTOMY Bilateral 11/05/2016   Procedure: MAXILLARY ANTROSTOMY;  Surgeon: Christia Reading, MD;  Location: [facility] OR;  Service: ENT;  Laterality: Bilateral;   SINUS ENDO W/FUSION Bilateral 11/05/2016   Procedure: FRONTAL RECESS EXPLORATION;  Surgeon: Christia Reading, MD;  Location: [facility] OR;  Service: ENT;  Laterality: Bilateral;  BILATERAL ENDOSCOPIC SINUS SURGERY WITH FUSION   SINUS EXPLORATION  11/05/2016   SPHENOIDECTOMY Bilateral 11/05/2016   Procedure: SPHENOIDECTOMY;  Surgeon: Christia Reading, MD;  Location: [facility] OR;   Service: ENT;  Laterality: Bilateral;   Family History:  Family History  Problem Relation Age of Onset   Heart disease Mother    Hyperlipidemia Mother    Hypertension Mother    Hypertension Father    Osteoarthritis Father    Stroke Father    Alzheimer's disease Sister    Alzheimer's disease Brother    Brain cancer Brother    Healthy Daughter    Healthy Son    Breast cancer Neg Hx    Family Psychiatric  History: Alzheimer's disease Sister and Brother  Social History:  Social History   Substance and Sexual Activity  Alcohol Use Yes   Comment: rarely     Social History   Substance and Sexual Activity  Drug Use No    Social History   Socioeconomic History   Marital status: Widowed    Spouse name: Not on file   Number of children: Not on file   Years of education: Not on file   Highest education level: Not on file  Occupational History   Not on file  Tobacco Use   Smoking status: Never   Smokeless tobacco: Never  Vaping Use   Vaping Use: Never used  Substance and Sexual Activity   Alcohol use: Yes    Comment: rarely   Drug use: No   Sexual activity: Not Currently  Other Topics Concern   Not on file  Social History Narrative   Daily Caffeine Use:  1-2 coffee   Regular Exercise -  NO   Social Determinants of Health   Financial Resource Strain: Low Risk  (03/12/2022)   Overall Financial Resource Strain (CARDIA)    Difficulty of Paying Living Expenses: Not hard at all  Food Insecurity: No Food Insecurity (03/12/2022)   Hunger Vital Sign    Worried About Running Out of Food in the Last Year: Never true    Ran Out of Food in the Last Year: Never true  Transportation Needs: No Transportation Needs (03/12/2022)   PRAPARE - Administrator, Civil ServiceTransportation    Lack of Transportation (Medical): No    Lack of Transportation (Non-Medical): No  Physical Activity: Insufficiently Active (03/12/2022)   Exercise Vital Sign    Days of Exercise per Week: 2 days    Minutes of Exercise  per Session: 20 min  Stress: No Stress Concern Present (03/12/2022)   [employer] - Occupational Stress Questionnaire    Feeling of Stress : Not at all  Social Connections: Unknown (03/12/2022)   Social Connection and Isolation Panel [NHANES]    Frequency of Communication with Friends and Family: More than three times a week    Frequency of Social Gatherings with Friends and Family: More than three times a week    Attends Religious Services: More than 4 times per year    Active Member of [employer] or Organizations: Not on file    Attends BankerClub or Organization Meetings: Not on file    Marital Status: Widowed   Additional Social History:    Allergies:   Allergies  Allergen Reactions   Lidocaine Palpitations   Codeine Nausea Only and Nausea And Vomiting   Morphine Nausea Only   Niacin Rash   Niacin And Related Rash    ANTIHYPERLIPEMICS    Labs:  Results for orders placed or performed during the hospital encounter of 09/08/22 (from the past 48 hour(s))  Comprehensive metabolic panel     Status: Abnormal   Collection Time: 09/08/22  8:57 PM  Result Value Ref Range   Sodium 141 135 - 145 mmol/L   Potassium 3.3 (L) 3.5 - 5.1 mmol/L   Chloride 103 98 - 111 mmol/L   CO2 29 22 - 32 mmol/L   Glucose, Bld 122 (H) 70 - 99 mg/dL    Comment: Glucose reference range applies only to samples taken after fasting for at least 8 hours.   BUN 15 8 - 23 mg/dL   Creatinine, Ser 1.610.72 0.44 - 1.00 mg/dL   Calcium 8.9 8.9 - 09.610.3 mg/dL   Total Protein 7.0 6.5 - 8.1 g/dL   Albumin 3.9 3.5 - 5.0 g/dL   AST 21 15 - 41 U/L   ALT 14 0 - 44 U/L   Alkaline Phosphatase 65 38 - 126 U/L   Total Bilirubin 0.6 0.3 - 1.2 mg/dL   GFR, Estimated >04>60 >54>60 mL/min    Comment: (NOTE) Calculated using the CKD-EPI Creatinine Equation (2021)    Anion gap 9 5 - 15    Comment: Performed at [facility], [address] [place], [place] [address]  Ethanol     Status: None    Collection Time: 09/08/22  8:57 PM  Result Value Ref Range   Alcohol, Ethyl (B) <10 <10 mg/dL    Comment: (NOTE) Lowest detectable limit for serum alcohol is 10 mg/dL.  For medical purposes only. Performed at [facility], [address] [place], [place] [address]   Salicylate level  Status: Abnormal   Collection Time: 09/08/22  8:57 PM  Result Value Ref Range   Salicylate Lvl <7.0 (L) 7.0 - 30.0 mg/dL    Comment: Performed at [facility], [address] [place], [place] [address]  Acetaminophen level     Status: Abnormal   Collection Time: 09/08/22  8:57 PM  Result Value Ref Range   Acetaminophen (Tylenol), Serum <10 (L) 10 - 30 ug/mL    Comment: (NOTE) Therapeutic concentrations vary significantly. A range of 10-30 ug/mL  may be an effective concentration for many patients. However, some  are best treated at concentrations outside of this range. Acetaminophen concentrations >150 ug/mL at 4 hours after ingestion  and >50 ug/mL at 12 hours after ingestion are often associated with  toxic reactions.  Performed at [facility], [address] [place], [place] [address]   cbc     Status: Abnormal   Collection Time: 09/08/22  8:57 PM  Result Value Ref Range   WBC 7.2 4.0 - 10.5 K/uL   RBC 3.82 (L) 3.87 - 5.11 MIL/uL   Hemoglobin 11.9 (L) 12.0 - 15.0 g/dL   HCT 20.1 00.7 - 12.1 %   MCV 96.6 80.0 - 100.0 fL   MCH 31.2 26.0 - 34.0 pg   MCHC 32.2 30.0 - 36.0 g/dL   RDW 97.5 88.3 - 25.4 %   Platelets 213 150 - 400 K/uL   nRBC 0.0 0.0 - 0.2 %    Comment: Performed at [facility], [address] [place], [place] [address]  TSH     Status: None   Collection Time: 09/08/22  8:57 PM  Result Value Ref Range   TSH 1.901 0.350 - 4.500 uIU/mL    Comment: Performed by a 3rd Generation assay with a functional sensitivity of <=0.01 uIU/mL. Performed at [facility], [address] [place], [place] [address]     Current  Facility-Administered Medications  Medication Dose Route Frequency Provider Last Rate Last Admin   albuterol (PROVENTIL) (2.5 MG/3ML) 0.083% nebulizer solution 2.5 mg  2.5 mg Inhalation Q6H PRN Chesley Noon, MD       ALPRAZolam Prudy Feeler) tablet 0.25 mg  0.25 mg Oral BID PRN Chesley Noon, MD   0.25 mg at 09/08/22 2333   celecoxib (CELEBREX) capsule 100 mg  100 mg Oral BID Corena Herter, MD   100 mg at 09/08/22 2321   FLUoxetine (PROZAC) capsule 60 mg  60 mg Oral Daily Chesley Noon, MD       hydrochlorothiazide (HYDRODIURIL) tablet 25 mg  25 mg Oral Daily Mumma, Shannon, MD       loratadine (CLARITIN) tablet 10 mg  10 mg Oral Daily Mumma, Shannon, MD       pantoprazole (PROTONIX) EC tablet 40 mg  40 mg Oral Daily Mumma, Shannon, MD       rosuvastatin (CRESTOR) tablet 40 mg  40 mg Oral Daily Mumma, Shannon, MD   40 mg at 09/08/22 2321   SUMAtriptan (IMITREX) tablet 50 mg  50 mg Oral Q2H PRN Chesley Noon, MD       traZODone (DESYREL) tablet 50 mg  50 mg Oral QHS PRN Chesley Noon, MD   50 mg at 09/08/22 2333   Current Outpatient Medications  Medication Sig Dispense Refill   albuterol (VENTOLIN HFA) 108 (90 Base) MCG/ACT inhaler Inhale 2 puffs into the lungs every 6 (six) hours as needed for wheezing or shortness of breath. 8 g 0   ALPRAZolam (XANAX) 0.25  MG tablet Take 1 tablet (0.25 mg total) by mouth 2 (two) times daily as needed. for anxiety 20 tablet 0   celecoxib (CELEBREX) 100 MG capsule Take 1 capsule by mouth twice daily 60 capsule 0   EQ ALLERGY RELIEF, CETIRIZINE, 10 MG tablet Take 1 tablet by mouth once daily 90 tablet 0   EQ ARTHRITIS PAIN RELIEVER 1 % GEL APPLY 2 GRAMS TOPICALLY FOUR TIMES DAILY AS NEEDED 50 g 0   FLUoxetine HCl 60 MG TABS Take 1 tablet by mouth daily. 30 tablet 11   fluticasone (FLONASE) 50 MCG/ACT nasal spray Place 1 spray into both nostrils daily. 16 g 0   hydrochlorothiazide (HYDRODIURIL) 25 MG tablet Take 1 tablet (25 mg total) by mouth daily. 90  tablet 0   methocarbamol (ROBAXIN) 500 MG tablet TAKE 1 TABLET BY MOUTH EVERY 8 HOURS AS NEEDED FOR MUSCLE SPASM 30 tablet 0   Multiple Vitamin (MULTI-VITAMIN) tablet Take 1 tablet by mouth daily.      Omega-3 Fatty Acids (FISH OIL PO) Take 1 tablet by mouth daily.     ondansetron (ZOFRAN-ODT) 4 MG disintegrating tablet Take 1 tablet (4 mg total) by mouth every 8 (eight) hours as needed for nausea or vomiting. 30 tablet 0   pantoprazole (PROTONIX) 40 MG tablet Take 1 tablet by mouth once daily 90 tablet 0   rosuvastatin (CRESTOR) 40 MG tablet Take 1 tablet (40 mg total) by mouth daily. 90 tablet 3   senna (SENOKOT) 8.6 MG TABS tablet Take 1 tablet by mouth at bedtime.      traMADol (ULTRAM) 50 MG tablet TAKE 1 TABLET BY MOUTH EVERY 6 HOURS AS NEEDED 30 tablet 0   traZODone (DESYREL) 50 MG tablet TAKE 1/2 TO 1 (ONE-HALF TO ONE) TABLET BY MOUTH AT BEDTIME AS NEEDED FOR SLEEP 30 tablet 0   tretinoin (RETIN-A) 0.05 % cream Apply 1 application topically at bedtime. 45 g 0   ondansetron (ZOFRAN) 4 MG tablet Take 1 tablet (4 mg total) by mouth every 8 (eight) hours as needed for nausea or vomiting. (Patient not taking: Reported on 09/08/2022) 20 tablet 0   predniSONE (DELTASONE) 50 MG tablet Take 1 tablet (50 mg total) by mouth daily with breakfast. (Patient not taking: Reported on 09/08/2022) 5 tablet 0   SUMAtriptan (IMITREX) 50 MG tablet Take 1 tablet (50 mg total) by mouth every 2 (two) hours as needed for migraine. May repeat in 2 hours if headache persists or recurs. No more than 2 doses in a 24 hour time frame. (Patient not taking: Reported on 09/08/2022) 10 tablet 0    Musculoskeletal: Strength & Muscle Tone: within normal limits Gait & Station: normal Patient leans: N/A Psychiatric Specialty Exam:  Presentation  General Appearance:  Appropriate for Environment  Eye Contact: Good  Speech: Clear and Coherent  Speech Volume: Normal  Handedness: Right   Mood and Affect   Mood: Depressed  Affect: Depressed; Flat; Tearful   Thought Process  Thought Processes: Coherent  Descriptions of Associations:Intact  Orientation:Full (Time, Place and Person)  Thought Content:Logical; Tangential  History of Schizophrenia/Schizoaffective disorder:No  Duration of Psychotic Symptoms:No data recorded Hallucinations:Hallucinations: None  Ideas of Reference:None  Suicidal Thoughts:Suicidal Thoughts: Yes, Passive SI Passive Intent and/or Plan: Without Plan; Without Intent  Homicidal Thoughts:Homicidal Thoughts: No   Sensorium  Memory: Immediate Good; Recent Good; Remote Good  Judgment: Poor  Insight: Poor   Executive Functions  Concentration: Fair  Attention Span: Fair  Recall: Good  Fund of Knowledge:  Good  Language: Good   Psychomotor Activity  Psychomotor Activity: Psychomotor Activity: Normal   Assets  Assets: Communication Skills; Desire for Improvement; Physical Health; Resilience; Social Support   Sleep  Sleep: Sleep: Fair   Physical Exam: Physical Exam Vitals and nursing note reviewed.  Constitutional:      Appearance: Normal appearance. She is obese.  HENT:     Head: Normocephalic and atraumatic.     Right Ear: External ear normal.     Left Ear: External ear normal.     Nose: Nose normal.     Mouth/Throat:     Mouth: Mucous membranes are moist.  Cardiovascular:     Rate and Rhythm: Normal rate.     Pulses: Normal pulses.  Pulmonary:     Effort: Pulmonary effort is normal.  Musculoskeletal:        General: Normal range of motion.     Cervical back: Normal range of motion and neck supple.  Neurological:     General: No focal deficit present.     Mental Status: She is alert and oriented to person, place, and time.  Psychiatric:        Attention and Perception: Attention and perception normal.        Mood and Affect: Mood is depressed. Affect is blunt and inappropriate.        Speech: Speech normal.         Behavior: Behavior normal. Behavior is cooperative.        Thought Content: Thought content includes suicidal ideation.        Cognition and Memory: Cognition and memory normal.        Judgment: Judgment is inappropriate.    Review of Systems  Psychiatric/Behavioral:  Positive for depression and suicidal ideas. The patient is nervous/anxious.   All other systems reviewed and are negative.  Blood pressure (!) 164/75, pulse 75, temperature 98.2 F (36.8 C), temperature source Oral, resp. rate 20, height 5\' 4"  (1.626 m), weight 86.2 kg, SpO2 100 %. Body mass index is 32.61 kg/m.  Treatment Plan Summary: Plan   The patient is a safety risk to himself and does not require psychiatric inpatient admission for stabilization and treatment.  Disposition: Recommend psychiatric Inpatient admission when medically cleared. Supportive therapy provided about ongoing stressors.   Gillermo Murdoch, NP 09/09/2022 1:43 AM

## 2022-09-09 NOTE — Progress Notes (Signed)
   09/09/22 0930  Psych Admission Type (Psych Patients Only)  Admission Status Involuntary  Psychosocial Assessment  Patient Complaints Crying spells;Depression;Anxiety  Eye Contact Brief  Facial Expression Anxious  Affect Anxious;Sullen  Speech Logical/coherent  Interaction Assertive  Motor Activity Slow  Appearance/Hygiene In scrubs  Behavior Characteristics Cooperative  Mood Pleasant  Thought Process  Coherency WDL  Content WDL  Delusions WDL  Perception WDL  Hallucination None reported or observed  Judgment WDL  Danger to Self  Current suicidal ideation? Verbalizes  Danger to Others  Danger to Others None reported or observed

## 2022-09-09 NOTE — Plan of Care (Signed)
  Problem: Education: Goal: Knowledge of General Education information will improve Description: Including pain rating scale, medication(s)/side effects and non-pharmacologic comfort measures Outcome: Not Progressing   Problem: Health Behavior/Discharge Planning: Goal: Ability to manage health-related needs will improve Outcome: Not Progressing   Problem: Clinical Measurements: Goal: Ability to maintain clinical measurements within normal limits will improve Outcome: Not Progressing Goal: Will remain free from infection Outcome: Not Progressing Goal: Diagnostic test results will improve Outcome: Not Progressing Goal: Respiratory complications will improve Outcome: Not Progressing Goal: Cardiovascular complication will be avoided Outcome: Not Progressing   Problem: Activity: Goal: Risk for activity intolerance will decrease Outcome: Not Progressing   Problem: Nutrition: Goal: Adequate nutrition will be maintained Outcome: Not Progressing   Problem: Coping: Goal: Level of anxiety will decrease Outcome: Not Progressing   Problem: Elimination: Goal: Will not experience complications related to bowel motility Outcome: Not Progressing Goal: Will not experience complications related to urinary retention Outcome: Not Progressing   Problem: Self-Concept: Goal: Ability to disclose and discuss suicidal ideas will improve Outcome: Not Progressing Goal: Will verbalize positive feelings about self Outcome: Not Progressing   Problem: Medication: Goal: Compliance with prescribed medication regimen will improve Outcome: Not Progressing   Problem: Health Behavior/Discharge Planning: Goal: Identification of resources available to assist in meeting health care needs will improve Outcome: Not Progressing

## 2022-09-09 NOTE — Tx Team (Signed)
Initial Treatment Plan 09/09/2022 12:20 PM [name] TDV:[id number]    PATIENT STRESSORS: Marital or family conflict     PATIENT STRENGTHS: Communication skills    PATIENT IDENTIFIED PROBLEMS: Patient stated that there is a lot of stress and tension in her house with family.                     DISCHARGE CRITERIA:  Adequate post-discharge living arrangements Improved stabilization in mood, thinking, and/or behavior Reduction of life-threatening or endangering symptoms to within safe limits Verbal commitment to aftercare and medication compliance  PRELIMINARY DISCHARGE PLAN: Return to previous living arrangement  PATIENT/FAMILY INVOLVEMENT: This treatment plan has been presented to and reviewed with the patient, Dana Obrien. The patient has been given the opportunity to ask questions and make suggestions.  Hall Busing, RN 09/09/2022, 12:20 PM

## 2022-09-09 NOTE — Group Note (Signed)
Date:  09/09/2022 Time:  11:28 AM  Group Topic/Focus:  Making Healthy Choices:   The focus of this group is to help patients identify negative/unhealthy choices they were using prior to admission and identify positive/healthier coping strategies to replace them upon discharge. Overcoming Stress:   The focus of this group is to define stress and help patients assess their triggers.    Participation Level:  Did Not Attend  Participation Quality:    Affect:    Cognitive:    Insight:   Engagement in Group:    Modes of Intervention:    Additional Comments:    [name] 09/09/2022, 11:28 AM

## 2022-09-09 NOTE — Progress Notes (Signed)
D- Patient alert and oriented x 4. Patient denies SI, HI, AVH, and pain. Patient complaining of an unrelieved headache. PRN medications administered.   A- Scheduled medications administered to patient, per MD orders. Support and encouragement provided.  Routine safety checks conducted every 15 minutes.  Patient informed to notify staff with problems or concerns.  R- No adverse drug reactions noted. Patient contracts for safety at this time. Patient compliant with medications and treatment plan. Patient receptive, calm, and cooperative. Patient interacts well with others on the unit.  Patient remains safe at this time.

## 2022-09-09 NOTE — BH Assessment (Signed)
Patient is to be admitted to [facility] by Psychiatric Nurse Practitioner Gillermo Murdoch.  Attending Physician will be Dr.  Marlou Porch .   Patient has been assigned to room L29A, by Mena Goes Nurse [place].   Intake Paper Work has been signed and placed on patient chart.  ER staff is aware of the admission: Armani, ER Secretary   Dr. Larinda Buttery, ER MD  Selena Batten, Patient's Nurse  Zollie Scale, Patient Access.   Pt can arrive pending negative covid results 09/09/22.

## 2022-09-09 NOTE — Progress Notes (Signed)
Report called to unit and Clinical research associate by ED RN Zack. Per conversation from [place] another set of vitals are due to be taken prior to patients transport to [place] unit since last BP reading was elevated. Patient stated to be dressed in [facility] pysch scrubs and checked by security already as well.

## 2022-09-09 NOTE — ED Notes (Signed)
ivc/psych consult ordered/pending.. 

## 2022-09-09 NOTE — Group Note (Signed)
Recreation Therapy Group Note   Group Topic:Self-Esteem  Group Date: 09/09/2022 Start Time: 1400 End Time: 1450 Facilitators: Rosina Lowenstein, LRT, CTRS Location: Courtyard  Group Description: Positive Focus. Patients are given a handout that has 9 different boxes on it. Each box has a different prompt on it that requires you to identify and add something positive about themselves. LRT encourages pts to share at least two of their boxes to the group. LRT and pts discuss the importance of "thinking positive", self-esteem and how they can apply it to their everyday life post-discharge.  Affect/Mood: N/A   Participation Level: Did not attend    Clinical Observations/Individualized Feedback: Dana Obrien did not attend group due to resting in her room.  Plan: Continue to engage patient in RT group sessions 2-3x/week.   Rosina Lowenstein, LRT, CTRS 09/09/2022 3:32 PM

## 2022-09-10 DIAGNOSIS — F322 Major depressive disorder, single episode, severe without psychotic features: Secondary | ICD-10-CM

## 2022-09-10 MED ORDER — BUPROPION HCL ER (XL) 150 MG PO TB24
150.0000 mg | ORAL_TABLET | Freq: Every day | ORAL | Status: DC
  Administered 2022-09-10 – 2022-09-16 (×7): 150 mg via ORAL
  Filled 2022-09-10 (×7): qty 1

## 2022-09-10 MED ORDER — DICLOFENAC SODIUM 1 % EX GEL
2.0000 g | Freq: Four times a day (QID) | CUTANEOUS | Status: DC | PRN
  Administered 2022-09-10 – 2022-09-16 (×7): 2 g via TOPICAL
  Filled 2022-09-10: qty 100

## 2022-09-10 MED ORDER — PINDOLOL 5 MG PO TABS
5.0000 mg | ORAL_TABLET | Freq: Two times a day (BID) | ORAL | Status: DC
  Administered 2022-09-10 – 2022-09-16 (×13): 5 mg via ORAL
  Filled 2022-09-10 (×13): qty 1

## 2022-09-10 MED ORDER — ZOLPIDEM TARTRATE 5 MG PO TABS
5.0000 mg | ORAL_TABLET | Freq: Every evening | ORAL | Status: DC | PRN
  Administered 2022-09-12 – 2022-09-15 (×4): 5 mg via ORAL
  Filled 2022-09-10 (×4): qty 1

## 2022-09-10 NOTE — Progress Notes (Signed)
Pt given CPAP brought by daughter. Pt examined it and says that one of the magnetic clips is missing from the headgear. Was able to fix it up with medical tape. If it becomes a problem (not a good seal), will need to contact respiratory therapy for a CPAP to use while pt in hospital.

## 2022-09-10 NOTE — Progress Notes (Signed)
Pt states her migraine pain only decreased a little. Can receive another dose of PRN medication in 10 minutes.

## 2022-09-10 NOTE — Plan of Care (Signed)
  Problem: Nutrition: Goal: Adequate nutrition will be maintained Outcome: Progressing   Problem: Pain Managment: Goal: General experience of comfort will improve Outcome: Not Progressing   Problem: Safety: Goal: Ability to remain free from injury will improve Outcome: Progressing   Problem: Skin Integrity: Goal: Risk for impaired skin integrity will decrease Outcome: Progressing   Problem: Education: Goal: Ability to make informed decisions regarding treatment will improve Outcome: Progressing   Problem: Self-Concept: Goal: Ability to disclose and discuss suicidal ideas will improve Outcome: Progressing

## 2022-09-10 NOTE — BHH Counselor (Signed)
Adult Comprehensive Assessment  Patient ID: Dana Obrien, female   DOB: 02-07-1946, 77 y.o.   MRN: [id number]  Information Source: Information source: Patient  Current Stressors:  Patient states their primary concerns and needs for treatment are:: "just got real depressed adn didn't want to live anymore" Patient states their goals for this hospitilization and ongoing recovery are:: Pt indicated that she does not have a goal. Educational / Learning stressors: Pt denies. Employment / Job issues: Pt denies. Family Relationships: "my son and his wife and his 2 kids live with me adn so does my daughter and her son, my son didn't like that I brought my daughter home" Financial / Lack of resources (include bankruptcy): Pt denies. Housing / Lack of housing: Pt reports contention within the family over the family home.  Pt reports that she left her home to both children in her will and patient is upset at this. Physical health (include injuries & life threatening diseases): "high blood pressure, degenerative bone disease, asthama, hard of hearing" Social relationships: "someone posted everything on Facebook so theres been a big mess" Substance abuse: Pt denies. Bereavement / Loss: "husband passed in 2019"  Living/Environment/Situation:  Living Arrangements: Children Who else lives in the home?: "my son, his wife, their two kids, my daughter and her son" How long has patient lived in current situation?: "24 years" pt reports she's lived in her home for 24 years but children recently returned What is atmosphere in current home: Other (Comment) ("very tense")  Family History:  Marital status: Widowed Widowed, when?: 2019 Does patient have children?: Yes How many children?: 2 How is patient's relationship with their children?: Pt reports that she is close with her children but her children are currently upset with one another and that is stressing her out.  Childhood History:  By whom was/is  the patient raised?: Mother, Grandparents Description of patient's relationship with caregiver when they were a child: "my mother was a manic depressive" Patient's description of current relationship with people who raised him/her: Pt reports that her mother is deceased. How were you disciplined when you got in trouble as a child/adolescent?: "I didn't get in trouble" Does patient have siblings?: Yes Number of Siblings: 4 Description of patient's current relationship with siblings: Pt reports that both of her brothers are deceased.  She reports that one of her sisters has passed and she is close with surviving half sisters. 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?: No  Financial Resources:   Financial resources: [employer], Receives SSI Does patient have a Lawyer or guardian?: No  Alcohol/Substance Abuse:   What has been your use of drugs/alcohol within the last 12 months?: Pt denies. If attempted suicide, did drugs/alcohol play a role in this?: No Alcohol/Substance Abuse Treatment Hx: Denies past history Has alcohol/substance abuse ever caused legal problems?: No  Social Support System:  Patient's Community Support System: Poor Describe Community Support System: "I try to keep my problems to myself" Type  of faith/religion: "Christian" How does patient's faith help to cope with current illness?: "I do devotion every day and I pray"  Leisure/Recreation:   Do You Have Hobbies?: Yes Leisure and Hobbies: "cooking, cleaning, yard work and reading"  Strengths/Needs:   What is the patient's perception of their strengths?: "very good Dance movement psychotherapist" Patient states they can use these personal strengths during their treatment to contribute to their recovery: Pt denies. Patient states these barriers may affect/interfere with their treatment: Pt denies. Patient states these barriers may affect their return to the community: Pt denies.  Discharge Plan:   Currently receiving community mental health services: No Patient states concerns and preferences for aftercare planning are: Pt reports that she is open to a referral at discharge. Patient states they will know when they are safe and ready for discharge when: "I don't know" Does patient have access to transportation?: Yes Does patient have financial barriers related to discharge medications?: No Will patient be returning to same living situation after discharge?: Yes  Summary/Recommendations:   Summary and Recommendations (to be completed by the evaluator): Patient is a 77 year old [facility] [place] [place] [facility]  Patient presents to the hospital for suicidal ideations.  Patient reports that she was triggered by the ongoing conflict between her two children who she recently moved back into her family home.  Patient reports that she was triggered by having to go to [place] to get her daughter and grandson.  She reports that her daughter is going through a divorce and has been struggling with alcoholism.  She reports that she left the family home to both children in her will to both children and her son is upset at this, as he feels that the home should be his.  She reports that her children have been bickering back and forth since the daughter return.   Patient reports that she does not have a current mental health provider, however, is open to a referral at discharge.  Recommendations include: crisis stabilization, therapeutic milieu, encourage group attendance and participation, medication management for mood stabilization and development of comprehensive mental wellness plan.  [name]. 09/10/2022

## 2022-09-10 NOTE — Progress Notes (Signed)
   09/09/22 1945  Psych Admission Type (Psych Patients Only)  Admission Status Involuntary  Psychosocial Assessment  Patient Complaints Crying spells;Depression;Anxiety  Eye Contact Fair  Facial Expression Sad  Affect Sad;Depressed  Speech Logical/coherent  Interaction Assertive  Motor Activity Slow  Appearance/Hygiene In scrubs  Behavior Characteristics Cooperative  Mood Depressed;Sad;Pleasant  Thought Process  Coherency WDL  Content WDL  Delusions None reported or observed  Perception WDL  Hallucination None reported or observed  Judgment WDL  Confusion None  Danger to Self  Current suicidal ideation? Denies  Self-Injurious Behavior No self-injurious ideation or behavior indicators observed or expressed   Agreement Not to Harm Self Yes  Description of Agreement verbal  Danger to Others  Danger to Others None reported or observed   Progress note   D: Pt seen in her room. Pt denies SI, HI, AVH. Pt contracts for safety. Pt rates pain  4/10 as chronic back pain. Pt previously had a migraine and was given PRNs. Pt rates anxiety  0/10 and depression  8/10. Pt had crying spells during evening group. Pt has OSA and uses a CPAP. Pt's daughter brought CPAP to hospital. Pt also HOH with hearing aids that are charging at nurse's station. Pt not forthcoming about her stressors and issues right now. Pt cooperative with plan of care. No other concerns noted at this time.  A: Pt provided support and encouragement. Pt given scheduled medication as prescribed. PRNs as appropriate. Q15 min checks for safety.   R: Pt safe on the unit. Will continue to monitor.

## 2022-09-10 NOTE — Progress Notes (Signed)
   09/10/22 1935  Psych Admission Type (Psych Patients Only)  Admission Status Involuntary  Psychosocial Assessment  Patient Complaints Depression;Anxiety;Crying spells  Eye Contact Fair  Facial Expression Sad  Affect Appropriate to circumstance  Speech Logical/coherent  Interaction Assertive  Motor Activity Slow  Appearance/Hygiene Unremarkable  Behavior Characteristics Cooperative;Appropriate to situation;Anxious  Mood Pleasant;Sad  Thought Process  Coherency WDL  Content WDL  Delusions None reported or observed  Perception WDL  Hallucination None reported or observed  Judgment WDL  Confusion None  Danger to Self  Current suicidal ideation? Denies  Agreement Not to Harm Self Yes  Description of Agreement verbal  Danger to Others  Danger to Others None reported or observed   Progress note   D: Pt seen in room laying down. Pt denies SI, HI, AVH. Contracts for safety. Pt rates pain  8/10 as migraine pain across front of her head. Pt rates anxiety  little/10 and depression  9/10. "Nothing's changed right now. I get to thinking about things and that makes me depressed." Pt states that her CPAP has a special mask and her dog has chewed on it so it is not making a good seal. "My daughter is getting me another mask from the supply store. I was Clelia Schaumann get another one a while ago. My dog chewed the foam on it. I have this bone (points to left side of face under eye) that is flattened so I can't wear a regular mask. They had to eventually give me one that they give to men who have beards. I'll be alright tonight." Pt HOB elevated for ease of breathing during sleep.  No other concerns noted at this time.  A: Pt provided support and encouragement. Pt given scheduled medication as prescribed. PRNs as appropriate. Q15 min checks for safety.   R: Pt safe on the unit. Will continue to monitor.

## 2022-09-10 NOTE — Progress Notes (Signed)
D- Patient alert and oriented x 4. Patient presents with a pleasant mood. Patient denies SI, HI, AVH, and pain. Patient complaining of a headache. PRN medication administered. Patient isolative to her room a lot of the day. Patient encouraged to interact with others on the unit.   A- Scheduled medications administered to patient, per MD orders. Support and encouragement provided.  Routine safety checks conducted every 15 minutes.  Patient informed to notify staff with problems or concerns.  R- No adverse drug reactions noted. Patient contracts for safety at this time. Patient compliant with medications and treatment plan. Patient receptive, calm, and cooperative. Patient interacts well with others on the unit.  Patient remains safe at this time.

## 2022-09-10 NOTE — BHH Suicide Risk Assessment (Signed)
[facility] Admission Suicide Risk Assessment   Nursing information obtained from:  Patient Demographic factors:  Age 77 or older, Caucasian Current Mental Status:  Suicidal ideation indicated by patient Loss Factors:  NA Historical Factors:  NA Risk Reduction Factors:  NA  Total Time spent with patient: 1 hour Principal Problem: Major depressive disorder, single episode, severe without psychosis Diagnosis:  Principal Problem:   Major depressive disorder, single episode, severe without psychosis  Subjective Data: Dana Obrien is a 77 y.o. female patient presented to [facility] ED via POV under involuntary commitment status (IVC). She expresses overwhelming feelings of being unable to cope, particularly regarding family stressors such as her daughter's failed marriage and alcoholism. She demonstrates soft speech and exhibits relevant and linear thoughts. Tearfulness accompanies discussions of her stressors, consistent with depression. The patient reports passive suicidal ideation for the past week, expressing a desire for divine intervention to relieve her suffering, though she denies having a specific plan. Poor insight and impaired judgment are noted. She presents with a tearful affect and a depressed mood, along with poor concentration but normal motor activity. The patient is alert and oriented x3, denying hallucinations.   Continued Clinical Symptoms:  Alcohol Use Disorder Identification Test Final Score (AUDIT): 0 The "Alcohol Use Disorders Identification Test", Guidelines for Use in Primary Care, Second Edition.  World Science writer [facility]). Score between 0-7:  no or low risk or alcohol related problems. Score between 8-15:  moderate risk of alcohol related problems. Score between 16-19:  high risk of alcohol related problems. Score 20 or above:  warrants further diagnostic evaluation for alcohol dependence and treatment.   CLINICAL FACTORS:   Depression:    Anhedonia   Musculoskeletal: Strength & Muscle Tone: within normal limits Gait & Station: normal Patient leans: N/A  Psychiatric Specialty Exam:  Presentation  General Appearance:  Appropriate for Environment  Eye Contact: Good  Speech: Clear and Coherent  Speech Volume: Normal  Handedness: Right   Mood and Affect  Mood: Depressed  Affect: Depressed; Flat; Tearful   Thought Process  Thought Processes: Coherent  Descriptions of Associations:Intact  Orientation:Full (Time, Place and Person)  Thought Content:Logical; Tangential  History of Schizophrenia/Schizoaffective disorder:No  Duration of Psychotic Symptoms:No data recorded Hallucinations:No data recorded Ideas of Reference:None  Suicidal Thoughts:No data recorded Homicidal Thoughts:No data recorded  Sensorium  Memory: Immediate Good; Recent Good; Remote Good  Judgment: Poor  Insight: Poor   Executive Functions  Concentration: Fair  Attention Span: Fair  Recall: Good  Fund of Knowledge: Good  Language: Good   Psychomotor Activity  Psychomotor Activity:No data recorded  Assets  Assets: Communication Skills; Desire for Improvement; Physical Health; Resilience; Social Support   Sleep  Sleep:No data recorded    Blood pressure (!) 176/91, pulse 71, temperature 98.6 F (37 C), temperature source Oral, resp. rate 18, SpO2 94 %. There is no height or weight on file to calculate BMI.   COGNITIVE FEATURES THAT CONTRIBUTE TO RISK:  None    SUICIDE RISK:   Minimal: No identifiable suicidal ideation.  Patients presenting with no risk factors but with morbid ruminations; may be classified as minimal risk based on the severity of the depressive symptoms  PLAN OF CARE: See orders  I certify that inpatient services furnished can reasonably be expected to improve the patient's condition.   Sarina Ill, DO 09/10/2022, 10:39 AM

## 2022-09-10 NOTE — Progress Notes (Signed)
Upon assessment, pt c/o migraine returning 8/10 (frontal). Pt given PRN medication. Will continue to monitor.

## 2022-09-10 NOTE — Group Note (Signed)
Recreation Therapy Group Note   Group Topic:Emotion Expression  Group Date: 09/10/2022 Start Time: 1400 End Time: 1450 Facilitators: Rosina Lowenstein, LRT, CTRS Location:  Day Room  Group Description: Picture a Peaceful Place. Patients and LRT discuss what it means to be "at peace", what it feels like physically and mentally. Pts are given a canvas and oil pastels to use and encouraged to paint their idea of a peaceful place. Pts and LRT discuss how they use this in their daily life post discharge. Pts are encouraged to take their canvas home with them, once its dried, as a reminder of their peaceful place whenever they are feeling depressed, anxious, etc.   Goal Area(s) Addressed:  Patient will identify what it means to experience a "peaceful" emotion. Patient will express their emotions through art. Patients will increase communication by talking with LRT and peers while in group.  Affect/Mood: N/A   Participation Level: Did not attend    Clinical Observations/Individualized Feedback: Boneta Lucks did not attend due to resting in her room.  Plan: Continue to engage patient in RT group sessions 2-3x/week.   Rosina Lowenstein, LRT, CTRS 09/10/2022 3:39 PM

## 2022-09-10 NOTE — H&P (Signed)
Psychiatric Admission Assessment Adult  Patient Identification: Dana Obrien MRN:  [id number] Date of Evaluation:  09/10/2022 Chief Complaint:  Major depressive disorder, single episode, severe without psychosis [F32.2] Principal Diagnosis: Major depressive disorder, single episode, severe without psychosis Diagnosis:  Principal Problem:   Major depressive disorder, single episode, severe without psychosis  History of Present Illness: Dana Obrien is a 77 year old white female who was involuntarily admitted to inpatient geriatric psychiatry for worsening depression and suicidal ideation.  Apparently, she wrote a suicide note to her son after experiencing a lot of family stressors.  There is a lot of drama going on in the house.  She is currently living with her son's wife his 2 children her daughter and her daughter's grandson.  The daughter has been experiencing much chaos in her life and the patient went to [facility] to pick her up after her husband left her daughter and her 62-year-old son.  She brought her back to [place] to live with her and now there is a lot of strife in the house with her son and daughter and the family dynamics.  She has never seen a psychiatrist in the past.  She has never been psychiatrically hospitalized.  She does not have any past suicide attempts.  She currently denies any suicidal ideation.  She states that she is just overwhelmed.  She is currently on Prozac 60 mg/day and she has been on it for most of her life she states.  Her PCP prescribes her medication.  She also endorses anhedonia, difficulty sleeping, extreme anxiety and panic attacks.  Her husband passed away in 10/01/2017 from an MI and she is a retired Engineer, civil (consulting) and worked in Dis[employer].  Her depression worsened her daughter moved back into the house approximately 6 weeks ago.  PER PSYCH. NP ER EVALUATION: ROSSANA MOLCHAN is a 77 y.o. female patient presented to [facility] ED via POV under involuntary commitment status  (IVC). She expresses overwhelming feelings of being unable to cope, particularly regarding family stressors such as her daughter's failed marriage and alcoholism. She demonstrates soft speech and exhibits relevant and linear thoughts. Tearfulness accompanies discussions of her stressors, consistent with depression. The patient reports passive suicidal ideation for the past week, expressing a desire for divine intervention to relieve her suffering, though she denies having a specific plan. Poor insight and impaired judgment are noted. She presents with a tearful affect and a depressed mood, along with poor concentration but normal motor activity. The patient is alert and oriented x3, denying hallucinations.   Associated Signs/Symptoms: Depression Symptoms:  depressed mood, anhedonia, insomnia, (Hypo) Manic Symptoms: None Anxiety Symptoms:  Excessive Worry, Social Anxiety, Psychotic Symptoms:   Unremarkable PTSD Symptoms: NA Total Time spent with patient: 1 hour  Past Psychiatric History: Dysthymia Is the patient at risk to self? Yes.    Has the patient been a risk to self in the past 6 months? Yes.    Has the patient been a risk to self within the distant past? No.  Is the patient a risk to others? No.  Has the patient been a risk to others in the past 6 months? No.  Has the patient been a risk to others within the distant past? No.   [place] Scale:  Flowsheet Row Admission (Current) from 09/09/2022 in [facility] ED from 09/08/2022 in [facility] Emergency Department at [facility] ED from 07/07/2022 in [facility] at [facility]  C-SSRS RISK CATEGORY Low Risk  [Simultaneous filing. User  may not have seen previous data.] High Risk No Risk        Prior Inpatient Therapy: No. If yes, describe  Prior Outpatient Therapy: No. If yes, describe   Alcohol Screening: 1. How often do you have a drink containing alcohol?: Never 2. How many  drinks containing alcohol do you have on a typical day when you are drinking?: 1 or 2 3. How often do you have six or more drinks on one occasion?: Never AUDIT-C Score: 0 4. How often during the last year have you found that you were not able to stop drinking once you had started?: Never 5. How often during the last year have you failed to do what was normally expected from you because of drinking?: Never 6. How often during the last year have you needed a first drink in the morning to get yourself going after a heavy drinking session?: Never 7. How often during the last year have you had a feeling of guilt of remorse after drinking?: Never 8. How often during the last year have you been unable to remember what happened the night before because you had been drinking?: Never 9. Have you or someone else been injured as a result of your drinking?: No 10. Has a relative or friend or a doctor or another health worker been concerned about your drinking or suggested you cut down?: No Alcohol Use Disorder Identification Test Final Score (AUDIT): 0 Substance Abuse History in the last 12 months:  No. 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   Cancer 2004   right lumpectomy, chemotherapy and radiation Dr. Koleen Nimrod   CPAP (continuous positive airway pressure) dependence    Depression    GERD (gastroesophageal reflux disease)    Hypertension    Migraine    Personal history of chemotherapy 2004   Personal history of radiation therapy 2004   Personal history of radiation therapy 2019   PONV (postoperative nausea and vomiting)     Shingles    Sleep apnea    uses CPAP, severe OSA   Vitamin D deficiency    IN THE PAST    Past Surgical History:  Procedure Laterality Date   ABDOMINAL HYSTERECTOMY     ANTERIOR AND POSTERIOR REPAIR     BREAST BIOPSY Right 2004   stereo. 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  3.5 cm maximum diameter, minimal margins 5 mm.  In situ component less than 10%.   BREAST LUMPECTOMY Right 2004   BREAST LUMPECTOMY Left 09/17/2017   Procedure: BREAST EXCISION, SENTINEL NODE BIOPSY;  Surgeon: Earline Mayotte, MD;  Location: [facility] ORS;  Service: General;  Laterality: Left;   COLONOSCOPY  2015   COLONOSCOPY WITH PROPOFOL N/A 10/04/2019   Procedure: COLONOSCOPY WITH PROPOFOL;  Surgeon: Earline Mayotte, MD;  Location: [facility] ENDOSCOPY;  Service: Endoscopy;  Laterality: N/A;   DILATION AND CURETTAGE OF UTERUS     ETHMOIDECTOMY Bilateral 11/05/2016  Procedure: ETHMOIDECTOMY;  Surgeon: Christia Reading, MD;  Location: [facility] OR;  Service: ENT;  Laterality: Bilateral;   LAPAROTOMY     MAXILLARY ANTROSTOMY Bilateral 11/05/2016   Procedure: MAXILLARY ANTROSTOMY;  Surgeon: Christia Reading, MD;  Location: [facility] OR;  Service: ENT;  Laterality: Bilateral;   SINUS ENDO W/FUSION Bilateral 11/05/2016   Procedure: FRONTAL RECESS EXPLORATION;  Surgeon: Christia Reading, MD;  Location: [facility] OR;  Service: ENT;  Laterality: Bilateral;  BILATERAL ENDOSCOPIC SINUS SURGERY WITH FUSION   SINUS EXPLORATION  11/05/2016   SPHENOIDECTOMY Bilateral 11/05/2016   Procedure: SPHENOIDECTOMY;  Surgeon: Christia Reading, MD;  Location: [facility] OR;  Service: ENT;  Laterality: Bilateral;   Family History:  Family History  Problem Relation Age of Onset   Heart disease Mother    Hyperlipidemia Mother    Hypertension Mother    Hypertension Father    Osteoarthritis Father     Stroke Father    Alzheimer's disease Sister    Alzheimer's disease Brother    Brain cancer Brother    Healthy Daughter    Healthy Son    Breast cancer Neg Hx    Family Psychiatric  History: Mother with depression and daughter with depression. Tobacco Screening:  Social History   Tobacco Use  Smoking Status Never  Smokeless Tobacco Never    BH Tobacco Counseling     Are you interested in Tobacco Cessation Medications?  N/A, patient does not use tobacco products Counseled patient on smoking cessation:  N/A, patient does not use tobacco products Reason Tobacco Screening Not Completed: No value filed.       Social History:  Social History   Substance and Sexual Activity  Alcohol Use Yes   Comment: rarely     Social History   Substance and Sexual Activity  Drug Use No    Additional Social History:                           Allergies:   Allergies  Allergen Reactions   Lidocaine Palpitations   Codeine Nausea Only and Nausea And Vomiting   Morphine Nausea Only   Niacin Rash   Niacin And Related Rash    ANTIHYPERLIPEMICS   Lab Results:  Results for orders placed or performed during the hospital encounter of 09/08/22 (from the past 48 hour(s))  Comprehensive metabolic panel     Status: Abnormal   Collection Time: 09/08/22  8:57 PM  Result Value Ref Range   Sodium 141 135 - 145 mmol/L   Potassium 3.3 (L) 3.5 - 5.1 mmol/L   Chloride 103 98 - 111 mmol/L   CO2 29 22 - 32 mmol/L   Glucose, Bld 122 (H) 70 - 99 mg/dL    Comment: Glucose reference range applies only to samples taken after fasting for at least 8 hours.   BUN 15 8 - 23 mg/dL   Creatinine, Ser 1.61 0.44 - 1.00 mg/dL   Calcium 8.9 8.9 - 09.6 mg/dL   Total Protein 7.0 6.5 - 8.1 g/dL   Albumin 3.9 3.5 - 5.0 g/dL   AST 21 15 - 41 U/L   ALT 14 0 - 44 U/L   Alkaline Phosphatase 65 38 - 126 U/L   Total Bilirubin 0.6 0.3 - 1.2 mg/dL   GFR, Estimated >04 >54 mL/min    Comment: (NOTE) Calculated  using the CKD-EPI Creatinine Equation (2021)    Anion gap 9 5 - 15    Comment: Performed at [facility] [facility], [address] [place], [place] [address]  Ethanol     Status: None   Collection Time: 09/08/22  8:57 PM  Result Value Ref Range   Alcohol, Ethyl (B) <10 <10 mg/dL    Comment: (NOTE) Lowest detectable limit for serum alcohol is 10 mg/dL.  For medical purposes only. Performed at [facility], [address] [place], [place] [address]   Salicylate level     Status: Abnormal   Collection Time: 09/08/22  8:57 PM  Result Value Ref Range   Salicylate Lvl <7.0 (L) 7.0 - 30.0 mg/dL    Comment: Performed at [facility], [address] [place], [place] [address]  Acetaminophen level     Status: Abnormal   Collection Time: 09/08/22  8:57 PM  Result Value Ref Range   Acetaminophen (Tylenol), Serum <10 (L) 10 - 30 ug/mL    Comment: (NOTE) Therapeutic concentrations vary significantly. A range of 10-30 ug/mL  may be an effective concentration for many patients. However, some  are best treated at concentrations outside of this range. Acetaminophen concentrations >150 ug/mL at 4 hours after ingestion  and >50 ug/mL at 12 hours after ingestion are often associated with  toxic reactions.  Performed at [facility], [address] [place], [place] [address]   cbc     Status: Abnormal   Collection Time: 09/08/22  8:57 PM  Result Value Ref Range   WBC 7.2 4.0 - 10.5 K/uL   RBC 3.82 (L) 3.87 - 5.11 MIL/uL   Hemoglobin 11.9 (L) 12.0 - 15.0 g/dL   HCT 54.6 56.8 - 12.7 %   MCV 96.6 80.0 - 100.0 fL   MCH 31.2 26.0 - 34.0 pg   MCHC 32.2 30.0 - 36.0 g/dL   RDW 51.7 00.1 - 74.9 %   Platelets 213 150 - 400 K/uL   nRBC 0.0 0.0 - 0.2 %    Comment: Performed at [facility], [address] [place], [place] [address]  TSH     Status: None   Collection Time: 09/08/22  8:57 PM  Result Value Ref Range   TSH 1.901 0.350 - 4.500 uIU/mL     Comment: Performed by a 3rd Generation assay with a functional sensitivity of <=0.01 uIU/mL. Performed at [facility], [address] [place], [place] [address]   SARS Coronavirus 2 by RT PCR (hospital order, performed in [facility] hospital lab) *cepheid single result test* Anterior Nasal Swab     Status: None   Collection Time: 09/09/22  6:41 AM   Specimen: Anterior Nasal Swab  Result Value Ref Range   SARS Coronavirus 2 by RT PCR NEGATIVE NEGATIVE    Comment: (NOTE) SARS-CoV-2 target nucleic acids are NOT DETECTED.  The SARS-CoV-2 RNA is generally detectable in upper and lower respiratory specimens during the acute phase of infection. The lowest concentration of SARS-CoV-2 viral copies this assay can detect is 250 copies / mL. A negative result does not preclude SARS-CoV-2 infection and should not be used as the sole basis for treatment or other patient management decisions.  A negative result may occur with improper specimen collection / handling, submission of specimen other than nasopharyngeal swab, presence of viral mutation(s) within the areas targeted by this assay, and inadequate number of viral copies (<250 copies / mL). A negative result must be combined with clinical observations, patient history, and epidemiological information.  Fact Sheet for Patients:   RoadLapTop.co.za  Fact Sheet for Healthcare Providers:  http://kim-miller.com/https://www.fda.gov/media/158404/download  This test is not yet approved or  cleared by the [place] FDA and has been authorized for detection and/or diagnosis of SARS-CoV-2 by FDA under an Emergency Use Authorization (EUA).  This EUA will remain in effect (meaning this test can be used) for the duration of the COVID-19 declaration under Section 564(b)(1) of the Act, 21 U.S.C. section 360bbb-3(b)(1), unless the authorization is terminated or revoked sooner.  Performed at [facility], [address]  [place], [place] [address]     Blood Alcohol level:  Lab Results  Component Value Date   [facility]    Metabolic Disorder Labs:  Lab Results  Component Value Date   HGBA1C 5.9 07/21/2022   No results found for: "PROLACTIN" Lab Results  Component Value Date   CHOL 174 07/21/2022   TRIG 135.0 07/21/2022   HDL 76.00 07/21/2022   CHOLHDL 2 07/21/2022   VLDL 27.0 07/21/2022   LDLCALC 71 07/21/2022   LDLCALC 63 07/01/2021    Current Medications: Current Facility-Administered Medications  Medication Dose Route Frequency Provider Last Rate Last Admin   acetaminophen (TYLENOL) tablet 650 mg  650 mg Oral Q6H PRN Sarina IllHerrick, Paden Senger Edward, DO   650 mg at 09/09/22 1541   albuterol (PROVENTIL) (2.5 MG/3ML) 0.083% nebulizer solution 2.5 mg  2.5 mg Inhalation Q6H PRN Sarina IllHerrick, Luciann Gossett Edward, DO       ALPRAZolam Prudy Feeler(XANAX) tablet 0.25 mg  0.25 mg Oral TID PRN Sarina IllHerrick, Cherron Blitzer Edward, DO   0.25 mg at 09/09/22 2306   alum & mag hydroxide-simeth (MAALOX/MYLANTA) 200-200-20 MG/5ML suspension 30 mL  30 mL Oral Q4H PRN Sarina IllHerrick, Ethelwyn Gilbertson Edward, DO       celecoxib (CELEBREX) capsule 100 mg  100 mg Oral BID Sarina IllHerrick, Rommel Hogston Edward, DO   100 mg at 09/09/22 2119   FLUoxetine (PROZAC) capsule 60 mg  60 mg Oral Daily Sarina IllHerrick, Gerhardt Gleed Edward, DO   60 mg at 09/09/22 1541   fluticasone (FLONASE) 50 MCG/ACT nasal spray 1 spray  1 spray Each Nare Daily Sarina IllHerrick, Montasia Chisenhall Edward, DO   1 spray at 09/09/22 1542   hydrochlorothiazide (HYDRODIURIL) tablet 25 mg  25 mg Oral Daily Sarina IllHerrick, Curtisha Bendix Edward, DO   25 mg at 09/09/22 1541   loratadine (CLARITIN) tablet 10 mg  10 mg Oral Daily Sarina IllHerrick, Deadra Diggins Edward, DO   10 mg at 09/09/22 1542   magnesium hydroxide (MILK OF MAGNESIA) suspension 30 mL  30 mL Oral Daily PRN Sarina IllHerrick, Gerrod Maule Edward, DO       methocarbamol (ROBAXIN) tablet 500 mg  500 mg Oral Q6H PRN Sarina IllHerrick, Lindamarie Maclachlan Edward, DO   500 mg at 09/09/22 2119   pantoprazole (PROTONIX) EC tablet 40 mg  40 mg Oral Daily  Sarina IllHerrick, Jermanie Minshall Edward, DO   40 mg at 09/09/22 1541   rosuvastatin (CRESTOR) tablet 40 mg  40 mg Oral Daily Sarina IllHerrick, Phallon Haydu Edward, DO   40 mg at 09/09/22 1542   senna (SENOKOT) tablet 8.6 mg  1 tablet Oral Daily Sarina IllHerrick, Haedyn Breau Edward, DO   8.6 mg at 09/09/22 1542   SUMAtriptan (IMITREX) tablet 50 mg  50 mg Oral Q2H PRN Sarina IllHerrick, Kaelene Elliston Edward, DO   50 mg at 09/09/22 1753   traMADol (ULTRAM) tablet 50 mg  50 mg Oral Q6H PRN Sarina IllHerrick, Ettie Krontz Edward, DO       traZODone (DESYREL) tablet 50 mg  50 mg Oral QHS Sarina IllHerrick, Shawntez Dickison Edward, DO   50 mg at 09/09/22 2119   PTA Medications: Medications Prior  to Admission  Medication Sig Dispense Refill Last Dose   albuterol (VENTOLIN HFA) 108 (90 Base) MCG/ACT inhaler Inhale 2 puffs into the lungs every 6 (six) hours as needed for wheezing or shortness of breath. 8 g 0    ALPRAZolam (XANAX) 0.25 MG tablet Take 1 tablet (0.25 mg total) by mouth 2 (two) times daily as needed. for anxiety 20 tablet 0    celecoxib (CELEBREX) 100 MG capsule Take 1 capsule by mouth twice daily 60 capsule 0    EQ ALLERGY RELIEF, CETIRIZINE, 10 MG tablet Take 1 tablet by mouth once daily 90 tablet 0    EQ ARTHRITIS PAIN RELIEVER 1 % GEL APPLY 2 GRAMS TOPICALLY FOUR TIMES DAILY AS NEEDED 50 g 0    FLUoxetine HCl 60 MG TABS Take 1 tablet by mouth daily. 30 tablet 11    fluticasone (FLONASE) 50 MCG/ACT nasal spray Place 1 spray into both nostrils daily. 16 g 0    hydrochlorothiazide (HYDRODIURIL) 25 MG tablet Take 1 tablet (25 mg total) by mouth daily. 90 tablet 0    methocarbamol (ROBAXIN) 500 MG tablet TAKE 1 TABLET BY MOUTH EVERY 8 HOURS AS NEEDED FOR MUSCLE SPASM 30 tablet 0    Multiple Vitamin (MULTI-VITAMIN) tablet Take 1 tablet by mouth daily.       Omega-3 Fatty Acids (FISH OIL PO) Take 1 tablet by mouth daily.      ondansetron (ZOFRAN) 4 MG tablet Take 1 tablet (4 mg total) by mouth every 8 (eight) hours as needed for nausea or vomiting. (Patient not taking: Reported on  09/08/2022) 20 tablet 0    ondansetron (ZOFRAN-ODT) 4 MG disintegrating tablet Take 1 tablet (4 mg total) by mouth every 8 (eight) hours as needed for nausea or vomiting. 30 tablet 0    pantoprazole (PROTONIX) 40 MG tablet Take 1 tablet by mouth once daily 90 tablet 0    predniSONE (DELTASONE) 50 MG tablet Take 1 tablet (50 mg total) by mouth daily with breakfast. (Patient not taking: Reported on 09/08/2022) 5 tablet 0    rosuvastatin (CRESTOR) 40 MG tablet Take 1 tablet (40 mg total) by mouth daily. 90 tablet 3    senna (SENOKOT) 8.6 MG TABS tablet Take 1 tablet by mouth at bedtime.       SUMAtriptan (IMITREX) 50 MG tablet Take 1 tablet (50 mg total) by mouth every 2 (two) hours as needed for migraine. May repeat in 2 hours if headache persists or recurs. No more than 2 doses in a 24 hour time frame. (Patient not taking: Reported on 09/08/2022) 10 tablet 0    traMADol (ULTRAM) 50 MG tablet TAKE 1 TABLET BY MOUTH EVERY 6 HOURS AS NEEDED 30 tablet 0    traZODone (DESYREL) 50 MG tablet TAKE 1/2 TO 1 (ONE-HALF TO ONE) TABLET BY MOUTH AT BEDTIME AS NEEDED FOR SLEEP 30 tablet 0    tretinoin (RETIN-A) 0.05 % cream Apply 1 application topically at bedtime. 45 g 0     Musculoskeletal: Strength & Muscle Tone: within normal limits Gait & Station: normal Patient leans: N/A            Psychiatric Specialty Exam:  Presentation  General Appearance:  Appropriate for Environment  Eye Contact: Good  Speech: Clear and Coherent  Speech Volume: Normal  Handedness: Right   Mood and Affect  Mood: Depressed  Affect: Depressed; Flat; Tearful   Thought Process  Thought Processes: Coherent  Duration of Psychotic Symptoms:N/A Past Diagnosis of Schizophrenia or Psychoactive  disorder: No  Descriptions of Associations:Intact  Orientation:Full (Time, Place and Person)  Thought Content:Logical; Tangential  Hallucinations:No data recorded Ideas of Reference:None  Suicidal Thoughts:No  data recorded Homicidal Thoughts:No data recorded  Sensorium  Memory: Immediate Good; Recent Good; Remote Good  Judgment: Poor  Insight: Poor   Executive Functions  Concentration: Fair  Attention Span: Fair  Recall: Good  Fund of Knowledge: Good  Language: Good   Psychomotor Activity  Psychomotor Activity:No data recorded  Assets  Assets: Communication Skills; Desire for Improvement; Physical Health; Resilience; Social Support   Sleep  Sleep:No data recorded   Physical Exam: Physical Exam Constitutional:      Appearance: Normal appearance.  HENT:     Head: Normocephalic and atraumatic.     Mouth/Throat:     Pharynx: Oropharynx is clear.  Eyes:     Pupils: Pupils are equal, round, and reactive to light.  Cardiovascular:     Rate and Rhythm: Normal rate and regular rhythm.  Pulmonary:     Effort: Pulmonary effort is normal.     Breath sounds: Normal breath sounds.  Abdominal:     General: Abdomen is flat.     Palpations: Abdomen is soft.  Musculoskeletal:        General: Normal range of motion.  Skin:    General: Skin is warm and dry.  Neurological:     General: No focal deficit present.     Mental Status: She is alert. Mental status is at baseline.  Psychiatric:        Attention and Perception: Attention and perception normal.        Mood and Affect: Mood is anxious and depressed. Affect is flat.        Speech: Speech normal.        Behavior: Behavior is cooperative.        Thought Content: Thought content normal.        Cognition and Memory: Cognition and memory normal.        Judgment: Judgment normal.    Review of Systems  Constitutional: Negative.   HENT: Negative.    Eyes: Negative.   Respiratory: Negative.    Cardiovascular: Negative.   Gastrointestinal: Negative.   Genitourinary: Negative.   Musculoskeletal: Negative.   Skin: Negative.   Neurological: Negative.   Endo/Heme/Allergies: Negative.   Psychiatric/Behavioral:   Positive for depression. The patient has insomnia.    Blood pressure (!) 176/91, pulse 71, temperature 98.6 F (37 C), temperature source Oral, resp. rate 18, SpO2 94 %. There is no height or weight on file to calculate BMI.  Treatment Plan Summary: Daily contact with patient to assess and evaluate symptoms and progress in treatment, Medication management, and Plan see orders  Observation Level/Precautions:  15 minute checks  Laboratory:  CBC Chemistry Profile  Psychotherapy:    Medications:    Consultations:    Discharge Concerns:    Estimated LOS:  Other:     Physician Treatment Plan for Primary Diagnosis: Major depressive disorder, single episode, severe without psychosis Long Term Goal(s): Improvement in symptoms so as ready for discharge  Short Term Goals: Ability to identify changes in lifestyle to reduce recurrence of condition will improve, Ability to verbalize feelings will improve, Ability to disclose and discuss suicidal ideas, Ability to demonstrate self-control will improve, Ability to identify and develop effective coping behaviors will improve, Ability to maintain clinical measurements within normal limits will improve, Compliance with prescribed medications will improve, and Ability to identify triggers associated with  substance abuse/mental health issues will improve  Physician Treatment Plan for Secondary Diagnosis: Principal Problem:   Major depressive disorder, single episode, severe without psychosis    I certify that inpatient services furnished can reasonably be expected to improve the patient's condition.    Sarina Ill, DO 4/11/202410:41 AM

## 2022-09-10 NOTE — [facility] Suicide Risk Assessment (Signed)
[facility] INPATIENT:  Family/Significant Other Suicide Prevention Education  Suicide Prevention Education:  Patient Refusal for Family/Significant Other Suicide Prevention Education: The patient Dana Obrien has refused to provide written consent for family/significant other to be provided Family/Significant Other Suicide Prevention Education during admission and/or prior to discharge.  Physician notified.  SPE completed with pt, as pt refused to consent to family contact. SPI pamphlet provided to pt and pt was encouraged to share information with support network, ask questions, and talk about any concerns relating to SPE. Pt denies access to guns/firearms and verbalized understanding of information provided. Mobile Crisis information also provided to pt.    [name] 09/10/2022, 2:58 PM

## 2022-09-11 DIAGNOSIS — F322 Major depressive disorder, single episode, severe without psychotic features: Secondary | ICD-10-CM | POA: Diagnosis not present

## 2022-09-11 NOTE — Progress Notes (Signed)
[facility] MD Progress Note  09/11/2022 11:13 AM Dana Obrien  MRN:  [id number] Subjective: Follow-up 77 year old woman with major depression.  Patient was seen today and she attended treatment team.  Patient discussed the major stresses in her life which seem to revolve around her children and other extended family moving into her house and then squabbling with each other.  Patient acknowledges that she "wanted to die" but claims she was not actually wanting to kill her self.  No new physical complaints at this point so far tolerating medication. 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   Cancer 2004   right lumpectomy, chemotherapy and radiation Dr. Koleen Nimrod   CPAP (continuous positive airway pressure) dependence    Depression    GERD (gastroesophageal reflux disease)    Hypertension    Migraine    Personal history of chemotherapy 2004   Personal history of radiation therapy 2004   Personal history of radiation therapy 2019   PONV (postoperative nausea and vomiting)    Shingles    Sleep apnea    uses CPAP, severe OSA   Vitamin D deficiency    IN THE PAST    Past Surgical History:  Procedure Laterality Date   ABDOMINAL HYSTERECTOMY     ANTERIOR AND POSTERIOR REPAIR     BREAST BIOPSY Right 2004   stereo. 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  3.5 cm maximum diameter, minimal margins 5 mm.  In situ component less than 10%.   BREAST LUMPECTOMY Right 2004   BREAST LUMPECTOMY Left 09/17/2017   Procedure: BREAST EXCISION, SENTINEL NODE BIOPSY;  Surgeon: Earline Mayotte, MD;  Location: [facility] ORS;  Service: General;  Laterality: Left;   COLONOSCOPY  2015   COLONOSCOPY WITH PROPOFOL N/A 10/04/2019   Procedure: COLONOSCOPY WITH PROPOFOL;  Surgeon: Earline Mayotte, MD;  Location: [facility] ENDOSCOPY;  Service: Endoscopy;  Laterality: N/A;   DILATION AND CURETTAGE OF UTERUS     ETHMOIDECTOMY Bilateral 11/05/2016   Procedure: ETHMOIDECTOMY;  Surgeon: Christia Reading, MD;  Location: [facility] OR;  Service: ENT;  Laterality: Bilateral;   LAPAROTOMY     MAXILLARY ANTROSTOMY Bilateral 11/05/2016   Procedure: MAXILLARY ANTROSTOMY;  Surgeon: Christia Reading, MD;  Location: [facility] OR;  Service: ENT;  Laterality: Bilateral;   SINUS ENDO W/FUSION Bilateral 11/05/2016   Procedure: FRONTAL RECESS EXPLORATION;  Surgeon: Christia Reading, MD;  Location: [facility] OR;  Service: ENT;  Laterality: Bilateral;  BILATERAL ENDOSCOPIC SINUS SURGERY WITH FUSION   SINUS EXPLORATION  11/05/2016   SPHENOIDECTOMY Bilateral 11/05/2016   Procedure: SPHENOIDECTOMY;  Surgeon: Christia Reading, MD;  Location: [facility] OR;  Service: ENT;  Laterality: Bilateral;   Family History:  Family History  Problem Relation Age of Onset  Heart disease Mother    Hyperlipidemia Mother    Hypertension Mother    Hypertension Father    Osteoarthritis Father    Stroke Father    Alzheimer's disease Sister    Alzheimer's disease Brother    Brain cancer Brother    Healthy Daughter    Healthy Son    Breast cancer Neg Hx    Family Psychiatric  History: See previous Social History:  Social History   Substance and Sexual Activity  Alcohol Use Yes   Comment:  rarely     Social History   Substance and Sexual Activity  Drug Use No    Social History   Socioeconomic History   Marital status: Widowed    Spouse name: Not on file   Number of children: Not on file   Years of education: Not on file   Highest education level: Not on file  Occupational History   Not on file  Tobacco Use   Smoking status: Never   Smokeless tobacco: Never  Vaping Use   Vaping Use: Never used  Substance and Sexual Activity   Alcohol use: Yes    Comment: rarely   Drug use: No   Sexual activity: Not Currently  Other Topics Concern   Not on file  Social History Narrative   Daily Caffeine Use:  1-2 coffee   Regular Exercise -  NO   Social Determinants of Health   Financial Resource Strain: Low Risk  (03/12/2022)   Overall Financial Resource Strain (CARDIA)    Difficulty of Paying Living Expenses: Not hard at all  Food Insecurity: No Food Insecurity (09/09/2022)   Hunger Vital Sign    Worried About Running Out of Food in the Last Year: Never true    Ran Out of Food in the Last Year: Never true  Transportation Needs: No Transportation Needs (09/09/2022)   PRAPARE - Administrator, Civil Service (Medical): No    Lack of Transportation (Non-Medical): No  Physical Activity: Insufficiently Active (03/12/2022)   Exercise Vital Sign    Days of Exercise per Week: 2 days    Minutes of Exercise per Session: 20 min  Stress: No Stress Concern Present (03/12/2022)   [employer] of Occupational Health - Occupational Stress Questionnaire    Feeling of Stress : Not at all  Social Connections: Unknown (03/12/2022)   Social Connection and Isolation Panel [NHANES]    Frequency of Communication with Friends and Family: More than three times a week    Frequency of Social Gatherings with Friends and Family: More than three times a week    Attends Religious Services: More than 4 times per year    Active Member of [employer] or Organizations: Not on file     Attends Banker Meetings: Not on file    Marital Status: Widowed   Additional Social History:                         Sleep: Fair  Appetite:  Fair  Current Medications: Current Facility-Administered Medications  Medication Dose Route Frequency Provider Last Rate Last Admin   acetaminophen (TYLENOL) tablet 650 mg  650 mg Oral Q6H PRN Sarina Ill, DO   650 mg at 09/09/22 1541   albuterol (PROVENTIL) (2.5 MG/3ML) 0.083% nebulizer solution 2.5 mg  2.5 mg Inhalation Q6H PRN Sarina Ill, DO       ALPRAZolam Prudy Feeler) tablet 0.25 mg  0.25 mg Oral TID PRN  Sarina Ill, DO   0.25 mg at 09/10/22 2111   alum & mag hydroxide-simeth (MAALOX/MYLANTA) 200-200-20 MG/5ML suspension 30 mL  30 mL Oral Q4H PRN Sarina Ill, DO       buPROPion (WELLBUTRIN XL) 24 hr tablet 150 mg  150 mg Oral Daily Sarina Ill, DO   150 mg at 09/11/22 1028   celecoxib (CELEBREX) capsule 100 mg  100 mg Oral BID Sarina Ill, DO   100 mg at 09/11/22 1028   diclofenac Sodium (VOLTAREN) 1 % topical gel 2 g  2 g Topical QID PRN Sarina Ill, DO   2 g at 09/11/22 1030   FLUoxetine (PROZAC) capsule 60 mg  60 mg Oral Daily Sarina Ill, DO   60 mg at 09/11/22 1029   fluticasone (FLONASE) 50 MCG/ACT nasal spray 1 spray  1 spray Each Nare Daily Sarina Ill, DO   1 spray at 09/11/22 1030   hydrochlorothiazide (HYDRODIURIL) tablet 25 mg  25 mg Oral Daily Sarina Ill, DO   25 mg at 09/11/22 1029   loratadine (CLARITIN) tablet 10 mg  10 mg Oral Daily Sarina Ill, DO   10 mg at 09/11/22 1029   magnesium hydroxide (MILK OF MAGNESIA) suspension 30 mL  30 mL Oral Daily PRN Sarina Ill, DO       methocarbamol (ROBAXIN) tablet 500 mg  500 mg Oral Q6H PRN Sarina Ill, DO   500 mg at 09/09/22 2119   pantoprazole (PROTONIX) EC tablet 40 mg  40 mg Oral Daily Sarina Ill,  DO   40 mg at 09/11/22 1029   pindolol (VISKEN) tablet 5 mg  5 mg Oral BID Sarina Ill, DO   5 mg at 09/11/22 1028   rosuvastatin (CRESTOR) tablet 40 mg  40 mg Oral Daily Sarina Ill, DO   40 mg at 09/11/22 1029   senna (SENOKOT) tablet 8.6 mg  1 tablet Oral Daily Sarina Ill, DO   8.6 mg at 09/11/22 1028   SUMAtriptan (IMITREX) tablet 50 mg  50 mg Oral Q2H PRN Sarina Ill, DO   50 mg at 09/10/22 2220   traMADol (ULTRAM) tablet 50 mg  50 mg Oral Q6H PRN Sarina Ill, DO       traZODone (DESYREL) tablet 50 mg  50 mg Oral QHS Sarina Ill, DO   50 mg at 09/10/22 2111   zolpidem (AMBIEN) tablet 5 mg  5 mg Oral QHS PRN Sarina Ill, DO        Lab Results: No results found for this or any previous visit (from the past 48 hour(s)).  Blood Alcohol level:  Lab Results  Component Value Date   ETH <10 09/08/2022    Metabolic Disorder Labs: Lab Results  Component Value Date   HGBA1C 5.9 07/21/2022   No results found for: "PROLACTIN" Lab Results  Component Value Date   CHOL 174 07/21/2022   TRIG 135.0 07/21/2022   HDL 76.00 07/21/2022   CHOLHDL 2 07/21/2022   VLDL 27.0 07/21/2022   LDLCALC 71 07/21/2022   LDLCALC 63 07/01/2021    Physical Findings: AIMS:  , ,  ,  ,    CIWA:    COWS:     Musculoskeletal: Strength & Muscle Tone: within normal limits Gait & Station: normal Patient leans: N/A  Psychiatric Specialty Exam:  Presentation  General Appearance:  Appropriate for Environment  Eye Contact: Good  Speech:  Clear and Coherent  Speech Volume: Normal  Handedness: Right   Mood and Affect  Mood: Depressed  Affect: Depressed; Flat; Tearful   Thought Process  Thought Processes: Coherent  Descriptions of Associations:Intact  Orientation:Full (Time, Place and Person)  Thought Content:Logical; Tangential  History of Schizophrenia/Schizoaffective disorder:No  Duration of  Psychotic Symptoms:No data recorded Hallucinations:No data recorded Ideas of Reference:None  Suicidal Thoughts:No data recorded Homicidal Thoughts:No data recorded  Sensorium  Memory: Immediate Good; Recent Good; Remote Good  Judgment: Poor  Insight: Poor   Executive Functions  Concentration: Fair  Attention Span: Fair  Recall: Good  Fund of Knowledge: Good  Language: Good   Psychomotor Activity  Psychomotor Activity:No data recorded  Assets  Assets: Communication Skills; Desire for Improvement; Physical Health; Resilience; Social Support   Sleep  Sleep:No data recorded   Physical Exam: Physical Exam Vitals and nursing note reviewed.  Constitutional:      Appearance: Normal appearance.  HENT:     Head: Normocephalic and atraumatic.     Mouth/Throat:     Pharynx: Oropharynx is clear.  Eyes:     Pupils: Pupils are equal, round, and reactive to light.  Cardiovascular:     Rate and Rhythm: Normal rate and regular rhythm.  Pulmonary:     Effort: Pulmonary effort is normal.     Breath sounds: Normal breath sounds.  Abdominal:     General: Abdomen is flat.     Palpations: Abdomen is soft.  Musculoskeletal:        General: Normal range of motion.  Skin:    General: Skin is warm and dry.  Neurological:     General: No focal deficit present.     Mental Status: She is alert. Mental status is at baseline.  Psychiatric:        Attention and Perception: Attention normal.        Mood and Affect: Mood is anxious and depressed.        Speech: Speech normal.        Behavior: Behavior normal.        Thought Content: Thought content includes suicidal ideation. Thought content does not include suicidal plan.        Cognition and Memory: Cognition normal.    Review of Systems  Constitutional: Negative.   HENT: Negative.    Eyes: Negative.   Respiratory: Negative.    Cardiovascular: Negative.   Gastrointestinal: Negative.   Musculoskeletal: Negative.    Skin: Negative.   Neurological: Negative.   Psychiatric/Behavioral:  Positive for depression and suicidal ideas.    Blood pressure (!) 144/65, pulse 66, temperature 98.3 F (36.8 C), resp. rate 18, SpO2 96 %. There is no height or weight on file to calculate BMI.   Treatment Plan Summary: Medication management and Plan Dr. Marlou Porch has restarted or started medication for depression.  Patient remains depressed somewhat tearful fixated on the stresses at home.  Asking for discharge but still with some suicidal thought.  Encourage group attendance.  Daily reassessment no change to medicine for today.  Mordecai Rasmussen, MD 09/11/2022, 11:13 AM

## 2022-09-11 NOTE — BH Assessment (Signed)
2025 Received a phone call from and individual stating that he was the patients son who gave the correct pass code. He was asked to remain on hold until patient was informed that she had a call.   Patient informed that she had a phone call from her son. Patient immediately responding " Oh, I don't want to talk to him; It is just a bunch of mess." When this writer got back on the phone the individual responded " I was waiting to talk to Dana Obrien but I ain't worried about it and hung up." He did not give this Clinical research associate the opportunity to tell him that his mother didn't want to speak to him at this time. Patient had ended a phone call from her daughter only a few minutes prior to her son calling. Will continue to monitor patient for safety.

## 2022-09-11 NOTE — BH IP Treatment Plan (Signed)
Interdisciplinary Treatment and Diagnostic Plan Update  09/11/2022 Time of Session: 10:19AM TAKIERA MAYO MRN: [id number]  Principal Diagnosis: Major depressive disorder, single episode, severe without psychosis  Secondary Diagnoses: Principal Problem:   Major depressive disorder, single episode, severe without psychosis   Current Medications:  Current Facility-Administered Medications  Medication Dose Route Frequency Provider Last Rate Last Admin   acetaminophen (TYLENOL) tablet 650 mg  650 mg Oral Q6H PRN Sarina Ill, DO   650 mg at 09/09/22 1541   albuterol (PROVENTIL) (2.5 MG/3ML) 0.083% nebulizer solution 2.5 mg  2.5 mg Inhalation Q6H PRN Sarina Ill, DO       ALPRAZolam Prudy Feeler) tablet 0.25 mg  0.25 mg Oral TID PRN Sarina Ill, DO   0.25 mg at 09/10/22 2111   alum & mag hydroxide-simeth (MAALOX/MYLANTA) 200-200-20 MG/5ML suspension 30 mL  30 mL Oral Q4H PRN Sarina Ill, DO       buPROPion (WELLBUTRIN XL) 24 hr tablet 150 mg  150 mg Oral Daily Sarina Ill, DO   150 mg at 09/11/22 1028   celecoxib (CELEBREX) capsule 100 mg  100 mg Oral BID Sarina Ill, DO   100 mg at 09/11/22 1028   diclofenac Sodium (VOLTAREN) 1 % topical gel 2 g  2 g Topical QID PRN Sarina Ill, DO   2 g at 09/11/22 1030   FLUoxetine (PROZAC) capsule 60 mg  60 mg Oral Daily Sarina Ill, DO   60 mg at 09/11/22 1029   fluticasone (FLONASE) 50 MCG/ACT nasal spray 1 spray  1 spray Each Nare Daily Sarina Ill, DO   1 spray at 09/11/22 1030   hydrochlorothiazide (HYDRODIURIL) tablet 25 mg  25 mg Oral Daily Sarina Ill, DO   25 mg at 09/11/22 1029   loratadine (CLARITIN) tablet 10 mg  10 mg Oral Daily Sarina Ill, DO   10 mg at 09/11/22 1029   magnesium hydroxide (MILK OF MAGNESIA) suspension 30 mL  30 mL Oral Daily PRN Sarina Ill, DO       methocarbamol (ROBAXIN) tablet 500 mg   500 mg Oral Q6H PRN Sarina Ill, DO   500 mg at 09/09/22 2119   pantoprazole (PROTONIX) EC tablet 40 mg  40 mg Oral Daily Sarina Ill, DO   40 mg at 09/11/22 1029   pindolol (VISKEN) tablet 5 mg  5 mg Oral BID Sarina Ill, DO   5 mg at 09/11/22 1028   rosuvastatin (CRESTOR) tablet 40 mg  40 mg Oral Daily Sarina Ill, DO   40 mg at 09/11/22 1029   senna (SENOKOT) tablet 8.6 mg  1 tablet Oral Daily Sarina Ill, DO   8.6 mg at 09/11/22 1028   SUMAtriptan (IMITREX) tablet 50 mg  50 mg Oral Q2H PRN Sarina Ill, DO   50 mg at 09/10/22 2220   traMADol (ULTRAM) tablet 50 mg  50 mg Oral Q6H PRN Sarina Ill, DO       traZODone (DESYREL) tablet 50 mg  50 mg Oral QHS Sarina Ill, DO   50 mg at 09/10/22 2111   zolpidem (AMBIEN) tablet 5 mg  5 mg Oral QHS PRN Sarina Ill, DO       PTA Medications: Medications Prior to Admission  Medication Sig Dispense Refill Last Dose   albuterol (VENTOLIN HFA) 108 (90 Base) MCG/ACT inhaler Inhale 2 puffs into the lungs every 6 (six) hours as  needed for wheezing or shortness of breath. 8 g 0    ALPRAZolam (XANAX) 0.25 MG tablet Take 1 tablet (0.25 mg total) by mouth 2 (two) times daily as needed. for anxiety 20 tablet 0    celecoxib (CELEBREX) 100 MG capsule Take 1 capsule by mouth twice daily 60 capsule 0    EQ ALLERGY RELIEF, CETIRIZINE, 10 MG tablet Take 1 tablet by mouth once daily 90 tablet 0    EQ ARTHRITIS PAIN RELIEVER 1 % GEL APPLY 2 GRAMS TOPICALLY FOUR TIMES DAILY AS NEEDED 50 g 0    FLUoxetine HCl 60 MG TABS Take 1 tablet by mouth daily. 30 tablet 11    fluticasone (FLONASE) 50 MCG/ACT nasal spray Place 1 spray into both nostrils daily. 16 g 0    hydrochlorothiazide (HYDRODIURIL) 25 MG tablet Take 1 tablet (25 mg total) by mouth daily. 90 tablet 0    methocarbamol (ROBAXIN) 500 MG tablet TAKE 1 TABLET BY MOUTH EVERY 8 HOURS AS NEEDED FOR MUSCLE SPASM 30  tablet 0    Multiple Vitamin (MULTI-VITAMIN) tablet Take 1 tablet by mouth daily.       Omega-3 Fatty Acids (FISH OIL PO) Take 1 tablet by mouth daily.      ondansetron (ZOFRAN) 4 MG tablet Take 1 tablet (4 mg total) by mouth every 8 (eight) hours as needed for nausea or vomiting. (Patient not taking: Reported on 09/08/2022) 20 tablet 0    ondansetron (ZOFRAN-ODT) 4 MG disintegrating tablet Take 1 tablet (4 mg total) by mouth every 8 (eight) hours as needed for nausea or vomiting. 30 tablet 0    pantoprazole (PROTONIX) 40 MG tablet Take 1 tablet by mouth once daily 90 tablet 0    predniSONE (DELTASONE) 50 MG tablet Take 1 tablet (50 mg total) by mouth daily with breakfast. (Patient not taking: Reported on 09/08/2022) 5 tablet 0    rosuvastatin (CRESTOR) 40 MG tablet Take 1 tablet (40 mg total) by mouth daily. 90 tablet 3    senna (SENOKOT) 8.6 MG TABS tablet Take 1 tablet by mouth at bedtime.       SUMAtriptan (IMITREX) 50 MG tablet Take 1 tablet (50 mg total) by mouth every 2 (two) hours as needed for migraine. May repeat in 2 hours if headache persists or recurs. No more than 2 doses in a 24 hour time frame. (Patient not taking: Reported on 09/08/2022) 10 tablet 0    traMADol (ULTRAM) 50 MG tablet TAKE 1 TABLET BY MOUTH EVERY 6 HOURS AS NEEDED 30 tablet 0    traZODone (DESYREL) 50 MG tablet TAKE 1/2 TO 1 (ONE-HALF TO ONE) TABLET BY MOUTH AT BEDTIME AS NEEDED FOR SLEEP 30 tablet 0    tretinoin (RETIN-A) 0.05 % cream Apply 1 application topically at bedtime. 45 g 0     Patient Stressors: Marital or family conflict    Patient Strengths: Communication skills   Treatment Modalities: Medication Management, Group therapy, Case management,  1 to 1 session with clinician, Psychoeducation, Recreational therapy.   Physician Treatment Plan for Primary Diagnosis: Major depressive disorder, single episode, severe without psychosis Long Term Goal(s): Improvement in symptoms so as ready for discharge   Short  Term Goals: Ability to identify changes in lifestyle to reduce recurrence of condition will improve Ability to verbalize feelings will improve Ability to disclose and discuss suicidal ideas Ability to demonstrate self-control will improve Ability to identify and develop effective coping behaviors will improve Ability to maintain clinical measurements within normal limits  will improve Compliance with prescribed medications will improve Ability to identify triggers associated with substance abuse/mental health issues will improve  Medication Management: Evaluate patient's response, side effects, and tolerance of medication regimen.  Therapeutic Interventions: 1 to 1 sessions, Unit Group sessions and Medication administration.  Evaluation of Outcomes: Progressing  Physician Treatment Plan for Secondary Diagnosis: Principal Problem:   Major depressive disorder, single episode, severe without psychosis  Long Term Goal(s): Improvement in symptoms so as ready for discharge   Short Term Goals: Ability to identify changes in lifestyle to reduce recurrence of condition will improve Ability to verbalize feelings will improve Ability to disclose and discuss suicidal ideas Ability to demonstrate self-control will improve Ability to identify and develop effective coping behaviors will improve Ability to maintain clinical measurements within normal limits will improve Compliance with prescribed medications will improve Ability to identify triggers associated with substance abuse/mental health issues will improve     Medication Management: Evaluate patient's response, side effects, and tolerance of medication regimen.  Therapeutic Interventions: 1 to 1 sessions, Unit Group sessions and Medication administration.  Evaluation of Outcomes: Progressing   RN Treatment Plan for Primary Diagnosis: Major depressive disorder, single episode, severe without psychosis Long Term Goal(s): Knowledge of  disease and therapeutic regimen to maintain health will improve  Short Term Goals: Ability to demonstrate self-control, Ability to participate in decision making will improve, Ability to verbalize feelings will improve, Ability to disclose and discuss suicidal ideas, Ability to identify and develop effective coping behaviors will improve, and Compliance with prescribed medications will improve  Medication Management: RN will administer medications as ordered by provider, will assess and evaluate patient's response and provide education to patient for prescribed medication. RN will report any adverse and/or side effects to prescribing provider.  Therapeutic Interventions: 1 on 1 counseling sessions, Psychoeducation, Medication administration, Evaluate responses to treatment, Monitor vital signs and CBGs as ordered, Perform/monitor CIWA, COWS, AIMS and Fall Risk screenings as ordered, Perform wound care treatments as ordered.  Evaluation of Outcomes: Progressing   LCSW Treatment Plan for Primary Diagnosis: Major depressive disorder, single episode, severe without psychosis Long Term Goal(s): Safe transition to appropriate next level of care at discharge, Engage patient in therapeutic group addressing interpersonal concerns.  Short Term Goals: Engage patient in aftercare planning with referrals and resources, Increase social support, Increase ability to appropriately verbalize feelings, Increase emotional regulation, Facilitate acceptance of mental health diagnosis and concerns, and Increase skills for wellness and recovery  Therapeutic Interventions: Assess for all discharge needs, 1 to 1 time with Social worker, Explore available resources and support systems, Assess for adequacy in community support network, Educate family and significant other(s) on suicide prevention, Complete Psychosocial Assessment, Interpersonal group therapy.  Evaluation of Outcomes: Progressing   Progress in  Treatment: Attending groups: No. Participating in groups: No. Taking medication as prescribed: Yes. Toleration medication: Yes. Family/Significant other contact made: No, will contact:  once permission is given. Patient understands diagnosis: Yes. Discussing patient identified problems/goals with staff: Yes. Medical problems stabilized or resolved: Yes. Denies suicidal/homicidal ideation: Yes. Issues/concerns per patient self-inventory: No. Other: none  New problem(s) identified: No, Describe:  none  New Short Term/Long Term Goal(s): medication management for mood stabilization; elimination of SI thoughts; development of comprehensive mental wellness plan.   Patient Goals:  "I really don't have any goals. I want to go home."  Discharge Plan or Barriers: CSW to assist patient in development of appropriate discharge plans.   Reason for Continuation of Hospitalization: Anxiety  Depression Medication stabilization Suicidal ideation  Estimated Length of Stay:  1-7 days  Last 3 [place] Suicide Severity Risk Score: Flowsheet Row Admission (Current) from 09/09/2022 in [facility] from 09/08/2022 in [facility] Emergency Department at [facility] ED from 07/07/2022 in [facility] Emergency Department at [facility]  C-SSRS RISK CATEGORY Low Risk  [Simultaneous filing. User may not have seen previous data.] High Risk No Risk       Last PHQ 2/9 Scores:    07/21/2022   10:12 AM 04/20/2022    9:20 AM 03/12/2022    8:32 AM  Depression screen PHQ 2/9  Decreased Interest 0 1 0  Down, Depressed, Hopeless 0 1 0  PHQ - 2 Score 0 2 0  Altered sleeping 0    Tired, decreased energy 0    Change in appetite 0    Feeling bad or failure about yourself  0    Trouble concentrating 0    Moving slowly or fidgety/restless 0    Suicidal thoughts 0    PHQ-9 Score 0    Difficult doing work/chores Not difficult at all      Scribe for Treatment Team: [name], LCSW 09/11/2022 10:41 AM

## 2022-09-11 NOTE — Plan of Care (Signed)
New goal as of 09/10/22  Problem: Group Participation Goal: STG - Patient will engage in groups without prompting or encouragement from LRT x3 group sessions within 5 recreation therapy group sessions Description: STG - Patient will engage in groups without prompting or encouragement from LRT x3 group sessions within 5 recreation therapy group sessions Outcome: Not Applicable   Lorena Benham LRT, [address]

## 2022-09-11 NOTE — Plan of Care (Signed)
  Problem: Health Behavior/Discharge Planning: Goal: Ability to manage health-related needs will improve Outcome: Progressing   Problem: Clinical Measurements: Goal: Ability to maintain clinical measurements within normal limits will improve Outcome: Progressing Goal: Diagnostic test results will improve Outcome: Progressing Goal: Respiratory complications will improve Outcome: Progressing Goal: Cardiovascular complication will be avoided Outcome: Progressing   Problem: Activity: Goal: Risk for activity intolerance will decrease Outcome: Progressing   Problem: Pain Managment: Goal: General experience of comfort will improve Outcome: Progressing   Problem: Safety: Goal: Ability to remain free from injury will improve Outcome: Progressing   Problem: Skin Integrity: Goal: Risk for impaired skin integrity will decrease Outcome: Progressing   Problem: Coping: Goal: Coping ability will improve Outcome: Progressing

## 2022-09-11 NOTE — Group Note (Signed)
Recreation Therapy Group Note   Group Topic:Leisure Education  Group Date: 09/11/2022 Start Time: 1400 End Time: 1415 Facilitators: Rosina Lowenstein, LRT, CTRS Location: Courtyard  Group Description: Leisure. Patients were given the option to choose from drawing with oil pastels, playing apples to apples, making origami, journaling, or listen to music duration of session. LRT and pts discussed the importance of participating in leisure during their free time and when they're outside of the hospital. Pt identified two leisure interests and shared with the group.   Goal Area(s) Addressed:  Patient will identify a current leisure interest.  Patient will practice making a positive decision. Patient will have the opportunity to try a new leisure activity.   Affect/Mood: N/A   Participation Level: Did not attend    Clinical Observations/Individualized Feedback: Dana Obrien did not attend group due to sleeping in her room.  Plan: Continue to engage patient in RT group sessions 2-3x/week.   Rosina Lowenstein, LRT, CTRS 09/11/2022 2:54 PM

## 2022-09-11 NOTE — Group Note (Signed)
LCSW Group Therapy Note  Group Date: 09/11/2022 Start Time: 1330 End Time: 1400   Type of Therapy and Topic:  Group Therapy - Healthy vs Unhealthy Coping Skills  Participation Level:  Did Not Attend   Description of Group The focus of this group was to determine what unhealthy coping techniques typically are used by group members and what healthy coping techniques would be helpful in coping with various problems. Patients were guided in becoming aware of the differences between healthy and unhealthy coping techniques. Patients were asked to identify 2-3 healthy coping skills they would like to learn to use more effectively.  Therapeutic Goals Patients learned that coping is what human beings do all day long to deal with various situations in their lives Patients defined and discussed healthy vs unhealthy coping techniques Patients identified their preferred coping techniques and identified whether these were healthy or unhealthy Patients determined 2-3 healthy coping skills they would like to become more familiar with and use more often. Patients provided support and ideas to each other   Summary of Patient Progress:  Patient asleep when group called.  CSW did not wake the patient.     Therapeutic Modalities Cognitive Behavioral Therapy Motivational Interviewing  [name], [place] 09/11/2022  2:22 PM

## 2022-09-11 NOTE — Plan of Care (Signed)
  Problem: Clinical Measurements: Goal: Ability to maintain clinical measurements within normal limits will improve Outcome: Progressing Goal: Will remain free from infection Outcome: Progressing   Problem: Nutrition: Goal: Adequate nutrition will be maintained Outcome: Progressing   Problem: Education: Goal: Ability to make informed decisions regarding treatment will improve Outcome: Progressing   Problem: Coping: Goal: Coping ability will improve Outcome: Progressing   Problem: Medication: Goal: Compliance with prescribed medication regimen will improve Outcome: Progressing

## 2022-09-11 NOTE — Progress Notes (Signed)
D- Patient alert and oriented x 4. Patient presents with a pleasant mood and affect. Patient denies SI, HI, AVH,  but endorses pain 6/10. PRN pain cream administered.   A- Scheduled medications administered to patient, per MD orders. Support and encouragement provided.  Routine safety checks conducted every 15 minutes.  Patient informed to notify staff with problems or concerns.  R- No adverse drug reactions noted. Patient contracts for safety at this time. Patient compliant with medications and treatment plan. Patient receptive, calm, and cooperative. Patient interacts well with others on the unit.  Patient remains safe at this time.

## 2022-09-11 NOTE — BH Assessment (Signed)
Recreation Therapy Notes  INPATIENT RECREATION THERAPY ASSESSMENT  Patient Details Name: Dana Obrien MRN: [id number] DOB: Oct 05, 1945 Today's Date: 09/11/2022       Information Obtained From: Patient (In addition to chart review)  Able to Participate in Assessment/Interview: Yes  Patient Presentation: Responsive, Alert, Oriented  Reason for Admission (Per Patient): Suicidal Ideation ("My daughter is having marital problems so I brought her home to the house my son and his family live in. I live upstairs and they are downstairs. My son and daughter do not get along. My son put a lot of this on Facebook for everyone to see.")  Patient Stressors: Family ("I have had enough of my son and daughter fighting. I just want them to love one another. I have done everything to try to get them to get along. I just cannot take it anymore. I am tired of living and I want the lord to just take me.")  Coping Skills:   Isolation, Other (Comment) ("medication")  Leisure Interests (2+):  Banker care, Individual - Reading, Individual - Other (Comment) ("cooking")  Frequency of Recreation/Participation: Monthly (Pt mentioned not being as active due to having chronis pain in her back and knee.)  Awareness of Community Resources:  Yes  Community Resources:  Architect and the doctors office")  Current Use: Yes  If no, Barriers?:    Expressed Interest in [employer] Information: No  [employer] of Residence:  Designer, jewellery"  Patient Main Form of Transportation: Car  Patient Strengths:  "I am an organized person...that's really it."  Patient Identified Areas of Improvement:  "I want my family to be more happy, to love each other. I have tried everything and nothing works. It just breaks my heart."  Patient Goal for Hospitalization:  "Nothing really."  Current SI (including self-harm):  No  Current HI:  No  Current AVH: No  Staff Intervention  Plan: Group Attendance, Collaborate with Interdisciplinary Treatment Team  Consent to Intern Participation: N/A  Pt shared that she packed her bags and went for a drive when her and her son were arguing. She said that she wrote him a note saying that she couldn't take it anymore but didn't really have a plan to hurt herself in that moment. Pt seemed to be minimizing situation. Pt became tearful when talking about her family not getting along. She shared that the both of her children are mad at her and she feels stuck in between them. She shared that her son posted their conflict on Facebook for everyone to see and that he was telling her and the daughter that they "would have no friends" after everything was said and done. Pt shared that her husband passed somewhat recently and that she just wants the lord to take her. She stated that she will most likely not attend groups due to not being able to sit for long periods of time, back pain, knee pain and migraines. LRT encouraged pt to attend group and shared what time group would be held daily.    Quintina Hakeem LRT, CTRS  Santasia Rew E Kearsten Ginther 09/11/2022, 7:58 AM

## 2022-09-12 DIAGNOSIS — F322 Major depressive disorder, single episode, severe without psychotic features: Secondary | ICD-10-CM | POA: Diagnosis not present

## 2022-09-12 NOTE — Group Note (Signed)
LCSW Group Therapy Note  Group Date: 09/12/2022 Start Time: 1330 End Time: 1400   Type of Therapy and Topic:  Group Therapy - Healthy vs Unhealthy Coping Skills  Participation Level:  Did Not Attend   Description of Group The focus of this group was to determine what unhealthy coping techniques typically are used by group members and what healthy coping techniques would be helpful in coping with various problems. Patients were guided in becoming aware of the differences between healthy and unhealthy coping techniques. Patients were asked to identify 2-3 healthy coping skills they would like to learn to use more effectively.  Therapeutic Goals Patients learned that coping is what human beings do all day long to deal with various situations in their lives Patients defined and discussed healthy vs unhealthy coping techniques Patients identified their preferred coping techniques and identified whether these were healthy or unhealthy Patients determined 2-3 healthy coping skills they would like to become more familiar with and use more often. Patients provided support and ideas to each other   Summary of Patient Progress:   X   Therapeutic Modalities Cognitive Behavioral Therapy Motivational Interviewing  Dana Obrien 09/12/2022  2:31 PM

## 2022-09-12 NOTE — Progress Notes (Signed)
[facility] MD Progress Note  09/12/2022 2:45 PM Dana Obrien  MRN:  [id number] Subjective: Follow-up patient with depression.  Patient says she is feeling better today.  Spoke with the chaplain and thinks that that helped her to clarify the situation.  Denies current suicidal thought. 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   Cancer 2004   right lumpectomy, chemotherapy and radiation Dr. Koleen Nimrod   CPAP (continuous positive airway pressure) dependence    Depression    GERD (gastroesophageal reflux disease)    Hypertension    Migraine    Personal history of chemotherapy 2004   Personal history of radiation therapy 2004   Personal history of radiation therapy 2019   PONV (postoperative nausea and vomiting)    Shingles    Sleep apnea    uses CPAP, severe OSA   Vitamin D deficiency    IN THE PAST    Past Surgical History:  Procedure Laterality Date   ABDOMINAL HYSTERECTOMY     ANTERIOR AND POSTERIOR REPAIR     BREAST BIOPSY Right 2004   stereo. 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  3.5 cm maximum diameter, minimal margins 5 mm.  In situ component less than 10%.   BREAST LUMPECTOMY Right 2004   BREAST LUMPECTOMY Left 09/17/2017   Procedure: BREAST  EXCISION, SENTINEL NODE BIOPSY;  Surgeon: Earline Mayotte, MD;  Location: [facility] ORS;  Service: General;  Laterality: Left;   COLONOSCOPY  2015   COLONOSCOPY WITH PROPOFOL N/A 10/04/2019   Procedure: COLONOSCOPY WITH PROPOFOL;  Surgeon: Earline Mayotte, MD;  Location: [facility] ENDOSCOPY;  Service: Endoscopy;  Laterality: N/A;   DILATION AND CURETTAGE OF UTERUS     ETHMOIDECTOMY Bilateral 11/05/2016   Procedure: ETHMOIDECTOMY;  Surgeon: Christia Reading, MD;  Location: [facility] OR;  Service: ENT;  Laterality: Bilateral;   LAPAROTOMY     MAXILLARY ANTROSTOMY Bilateral 11/05/2016   Procedure: MAXILLARY ANTROSTOMY;  Surgeon: Christia Reading, MD;  Location: [facility] OR;  Service: ENT;  Laterality: Bilateral;   SINUS ENDO W/FUSION Bilateral 11/05/2016   Procedure: FRONTAL RECESS EXPLORATION;  Surgeon: Christia Reading, MD;  Location: [facility] OR;  Service: ENT;  Laterality: Bilateral;  BILATERAL ENDOSCOPIC SINUS SURGERY WITH FUSION   SINUS EXPLORATION  11/05/2016   SPHENOIDECTOMY Bilateral 11/05/2016   Procedure: SPHENOIDECTOMY;  Surgeon: Christia Reading, MD;  Location: [facility] OR;  Service: ENT;  Laterality: Bilateral;   Family History:  Family History  Problem Relation Age of Onset   Heart disease Mother    Hyperlipidemia Mother    Hypertension Mother    Hypertension Father    Osteoarthritis Father    Stroke Father    Alzheimer's disease Sister    Alzheimer's disease Brother    Brain cancer Brother    Healthy Daughter  Healthy Son    Breast cancer Neg Hx    Family Psychiatric  History: See previous Social History:  Social History   Substance and Sexual Activity  Alcohol Use Yes   Comment: rarely     Social History   Substance and Sexual Activity  Drug Use No    Social History   Socioeconomic History   Marital status: Widowed    Spouse name: Not on file   Number of children: Not on file   Years of education: Not on file   Highest education level: Not on file  Occupational History   Not on file  Tobacco Use    Smoking status: Never   Smokeless tobacco: Never  Vaping Use   Vaping Use: Never used  Substance and Sexual Activity   Alcohol use: Yes    Comment: rarely   Drug use: No   Sexual activity: Not Currently  Other Topics Concern   Not on file  Social History Narrative   Daily Caffeine Use:  1-2 coffee   Regular Exercise -  NO   Social Determinants of Health   Financial Resource Strain: Low Risk  (03/12/2022)   Overall Financial Resource Strain (CARDIA)    Difficulty of Paying Living Expenses: Not hard at all  Food Insecurity: No Food Insecurity (09/09/2022)   Hunger Vital Sign    Worried About Running Out of Food in the Last Year: Never true    Ran Out of Food in the Last Year: Never true  Transportation Needs: No Transportation Needs (09/09/2022)   PRAPARE - Administrator, Civil Service (Medical): No    Lack of Transportation (Non-Medical): No  Physical Activity: Insufficiently Active (03/12/2022)   Exercise Vital Sign    Days of Exercise per Week: 2 days    Minutes of Exercise per Session: 20 min  Stress: No Stress Concern Present (03/12/2022)   [employer] of Occupational Health - Occupational Stress Questionnaire    Feeling of Stress : Not at all  Social Connections: Unknown (03/12/2022)   Social Connection and Isolation Panel [NHANES]    Frequency of Communication with Friends and Family: More than three times a week    Frequency of Social Gatherings with Friends and Family: More than three times a week    Attends Religious Services: More than 4 times per year    Active Member of [employer] or Organizations: Not on file    Attends Banker Meetings: Not on file    Marital Status: Widowed   Additional Social History:                         Sleep: Fair  Appetite:  Fair  Current Medications: Current Facility-Administered Medications  Medication Dose Route Frequency Provider Last Rate Last Admin   acetaminophen (TYLENOL) tablet  650 mg  650 mg Oral Q6H PRN Sarina Ill, DO   650 mg at 09/12/22 1415   albuterol (PROVENTIL) (2.5 MG/3ML) 0.083% nebulizer solution 2.5 mg  2.5 mg Inhalation Q6H PRN Sarina Ill, DO       ALPRAZolam Prudy Feeler) tablet 0.25 mg  0.25 mg Oral TID PRN Sarina Ill, DO   0.25 mg at 09/11/22 2107   alum & mag hydroxide-simeth (MAALOX/MYLANTA) 200-200-20 MG/5ML suspension 30 mL  30 mL Oral Q4H PRN Sarina Ill, DO       buPROPion (WELLBUTRIN XL) 24 hr tablet 150 mg  150 mg Oral Daily Elane Fritz  Edward, DO   150 mg at 09/12/22 1610   celecoxib (CELEBREX) capsule 100 mg  100 mg Oral BID Sarina Ill, DO   100 mg at 09/12/22 1001   diclofenac Sodium (VOLTAREN) 1 % topical gel 2 g  2 g Topical QID PRN Sarina Ill, DO   2 g at 09/12/22 1225   FLUoxetine (PROZAC) capsule 60 mg  60 mg Oral Daily Sarina Ill, DO   60 mg at 09/12/22 0958   fluticasone (FLONASE) 50 MCG/ACT nasal spray 1 spray  1 spray Each Nare Daily Sarina Ill, DO   1 spray at 09/12/22 1001   hydrochlorothiazide (HYDRODIURIL) tablet 25 mg  25 mg Oral Daily Sarina Ill, DO   25 mg at 09/12/22 0959   loratadine (CLARITIN) tablet 10 mg  10 mg Oral Daily Sarina Ill, DO   10 mg at 09/12/22 9604   magnesium hydroxide (MILK OF MAGNESIA) suspension 30 mL  30 mL Oral Daily PRN Sarina Ill, DO       methocarbamol (ROBAXIN) tablet 500 mg  500 mg Oral Q6H PRN Sarina Ill, DO   500 mg at 09/11/22 2108   pantoprazole (PROTONIX) EC tablet 40 mg  40 mg Oral Daily Sarina Ill, DO   40 mg at 09/12/22 5409   pindolol (VISKEN) tablet 5 mg  5 mg Oral BID Sarina Ill, DO   5 mg at 09/12/22 1009   rosuvastatin (CRESTOR) tablet 40 mg  40 mg Oral Daily Sarina Ill, DO   40 mg at 09/12/22 1414   senna (SENOKOT) tablet 8.6 mg  1 tablet Oral Daily Sarina Ill, DO   8.6 mg at 09/12/22  8119   SUMAtriptan (IMITREX) tablet 50 mg  50 mg Oral Q2H PRN Sarina Ill, DO   50 mg at 09/10/22 2220   traMADol (ULTRAM) tablet 50 mg  50 mg Oral Q6H PRN Sarina Ill, DO       traZODone (DESYREL) tablet 50 mg  50 mg Oral QHS Sarina Ill, DO   50 mg at 09/11/22 2107   zolpidem (AMBIEN) tablet 5 mg  5 mg Oral QHS PRN Sarina Ill, DO        Lab Results: No results found for this or any previous visit (from the past 48 hour(s)).  Blood Alcohol level:  Lab Results  Component Value Date   ETH <10 09/08/2022    Metabolic Disorder Labs: Lab Results  Component Value Date   HGBA1C 5.9 07/21/2022   No results found for: "PROLACTIN" Lab Results  Component Value Date   CHOL 174 07/21/2022   TRIG 135.0 07/21/2022   HDL 76.00 07/21/2022   CHOLHDL 2 07/21/2022   VLDL 27.0 07/21/2022   LDLCALC 71 07/21/2022   LDLCALC 63 07/01/2021    Physical Findings: AIMS:  , ,  ,  ,    CIWA:    COWS:     Musculoskeletal: Strength & Muscle Tone: within normal limits Gait & Station: normal Patient leans: N/A  Psychiatric Specialty Exam:  Presentation  General Appearance:  Appropriate for Environment  Eye Contact: Good  Speech: Clear and Coherent  Speech Volume: Normal  Handedness: Right   Mood and Affect  Mood: Depressed  Affect: Depressed; Flat; Tearful   Thought Process  Thought Processes: Coherent  Descriptions of Associations:Intact  Orientation:Full (Time, Place and Person)  Thought Content:Logical; Tangential  History of Schizophrenia/Schizoaffective disorder:No  Duration of Psychotic  Symptoms:No data recorded Hallucinations:No data recorded Ideas of Reference:None  Suicidal Thoughts:No data recorded Homicidal Thoughts:No data recorded  Sensorium  Memory: Immediate Good; Recent Good; Remote Good  Judgment: Poor  Insight: Poor   Executive Functions  Concentration: Fair  Attention  Span: Fair  Recall: Good  Fund of Knowledge: Good  Language: Good   Psychomotor Activity  Psychomotor Activity:No data recorded  Assets  Assets: Communication Skills; Desire for Improvement; Physical Health; Resilience; Social Support   Sleep  Sleep:No data recorded   Physical Exam: Physical Exam Vitals and nursing note reviewed.  Constitutional:      Appearance: Normal appearance.  HENT:     Head: Normocephalic and atraumatic.     Mouth/Throat:     Pharynx: Oropharynx is clear.  Eyes:     Pupils: Pupils are equal, round, and reactive to light.  Cardiovascular:     Rate and Rhythm: Normal rate and regular rhythm.  Pulmonary:     Effort: Pulmonary effort is normal.     Breath sounds: Normal breath sounds.  Abdominal:     General: Abdomen is flat.     Palpations: Abdomen is soft.  Musculoskeletal:        General: Normal range of motion.  Skin:    General: Skin is warm and dry.  Neurological:     General: No focal deficit present.     Mental Status: She is alert. Mental status is at baseline.  Psychiatric:        Attention and Perception: Attention normal.        Mood and Affect: Mood normal.        Speech: Speech normal.        Behavior: Behavior normal.        Thought Content: Thought content normal.        Cognition and Memory: Cognition normal.    Review of Systems  Constitutional: Negative.   HENT: Negative.    Eyes: Negative.   Respiratory: Negative.    Cardiovascular: Negative.   Gastrointestinal: Negative.   Musculoskeletal: Negative.   Skin: Negative.   Neurological: Negative.   Psychiatric/Behavioral: Negative.     Blood pressure 118/68, pulse 62, temperature 98.5 F (36.9 C), temperature source Oral, resp. rate 16, SpO2 97 %. There is no height or weight on file to calculate BMI.   Treatment Plan Summary: Daily contact with patient to assess and evaluate symptoms and progress in treatment, Medication management, and Plan no change  to medication.  Supportive counseling and encouragement and review of the patient's plan.  Ongoing assessment of dangerousness prior to discharge  Mordecai Rasmussen, MD 09/12/2022, 2:45 PM

## 2022-09-12 NOTE — Plan of Care (Signed)
Pt endorses anxiety/depression at this time however reports that it has improved from this morning. Pt denies SI/HI/AVH or pain at this time. Pt is calm and cooperative. Pt is medication compliant. Pt provided with support and encouragement. Pt monitored q15 minutes for safety per unit policy. Plan of care ongoing.   Problem: Nutrition: Goal: Adequate nutrition will be maintained Outcome: Progressing   Problem: Coping: Goal: Level of anxiety will decrease 09/12/2022 2046 by Sharin Mons, RN Outcome: Progressing 09/12/2022 1115 by Sharin Mons, RN Outcome: Not Progressing

## 2022-09-12 NOTE — Progress Notes (Signed)
Patient compliant with medications interacting well with Peers and Staff. Patient has a sad affect denies SI/HI/A/VH and verbally contracts for safety. Patient endorses anxiety and depression. No adverse medication effects noted. Q 15 minutes safety checks ongoing without self harm gestures. Support and encouragement provided.

## 2022-09-12 NOTE — Progress Notes (Signed)
   09/12/22 1700  Spiritual Encounters  Type of Visit Initial  Care provided to: Patient  Conversation partners present during encounter Nurse  Referral source Nurse (RN/NT/LPN)  Reason for visit Routine spiritual support  OnCall Visit No  Spiritual Framework  Family Stress Factors Family relationships  Interventions  Spiritual Care Interventions Made Compassionate presence;Reflective listening;Encouragement;Prayer;Established relationship of care and support  Intervention Outcomes  Outcomes Connection to spiritual care;Awareness around self/spiritual resourses;Reduced anxiety;Awareness of support  Spiritual Care Plan  Spiritual Care Issues Still Outstanding No further spiritual care needs at this time (see row info)   Chaplain visited at bedside, provided prayer and Bible as requested. Chaplain provided compassion and empathetic listening.  Patient voiced feeling a little better. Encouraged patient to reach out for continued care as needed.

## 2022-09-12 NOTE — Plan of Care (Signed)
Pt endorses anxiety/depression at this time. Pt denies SI/HI/AVH however endorses experiencing a headache and bilat knee pain at this time. Pt is calm and cooperative. Pt is medication compliant. Pt provided with support and encouragement. Pt monitored q15 minutes for safety per unit policy. Plan of care ongoing.   Problem: Education: Goal: Knowledge of General Education information will improve Description: Including pain rating scale, medication(s)/side effects and non-pharmacologic comfort measures Outcome: Progressing   Problem: Coping: Goal: Level of anxiety will decrease Outcome: Not Progressing

## 2022-09-13 DIAGNOSIS — F322 Major depressive disorder, single episode, severe without psychotic features: Secondary | ICD-10-CM | POA: Diagnosis not present

## 2022-09-13 NOTE — Progress Notes (Signed)
[facility] MD Progress Note  09/13/2022 2:05 PM Dana Obrien  MRN:  [id number] Subjective: Patient reports mood continues to be improved.  No longer feeling depressed.  More hopeful.  Denies suicidal thought. 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   Cancer 2004   right lumpectomy, chemotherapy and radiation Dr. Koleen Nimrod   CPAP (continuous positive airway pressure) dependence    Depression    GERD (gastroesophageal reflux disease)    Hypertension    Migraine    Personal history of chemotherapy 2004   Personal history of radiation therapy 2004   Personal history of radiation therapy 2019   PONV (postoperative nausea and vomiting)    Shingles    Sleep apnea    uses CPAP, severe OSA   Vitamin D deficiency    IN THE PAST    Past Surgical History:  Procedure Laterality Date   ABDOMINAL HYSTERECTOMY     ANTERIOR AND POSTERIOR REPAIR     BREAST BIOPSY Right 2004   stereo. 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  3.5 cm maximum diameter, minimal margins 5 mm.  In situ component less than 10%.   BREAST LUMPECTOMY Right 2004   BREAST LUMPECTOMY Left 09/17/2017   Procedure: BREAST EXCISION, SENTINEL NODE BIOPSY;  Surgeon: Earline Mayotte, MD;   Location: [facility] ORS;  Service: General;  Laterality: Left;   COLONOSCOPY  2015   COLONOSCOPY WITH PROPOFOL N/A 10/04/2019   Procedure: COLONOSCOPY WITH PROPOFOL;  Surgeon: Earline Mayotte, MD;  Location: [facility] ENDOSCOPY;  Service: Endoscopy;  Laterality: N/A;   DILATION AND CURETTAGE OF UTERUS     ETHMOIDECTOMY Bilateral 11/05/2016   Procedure: ETHMOIDECTOMY;  Surgeon: Christia Reading, MD;  Location: [facility] OR;  Service: ENT;  Laterality: Bilateral;   LAPAROTOMY     MAXILLARY ANTROSTOMY Bilateral 11/05/2016   Procedure: MAXILLARY ANTROSTOMY;  Surgeon: Christia Reading, MD;  Location: [facility] OR;  Service: ENT;  Laterality: Bilateral;   SINUS ENDO W/FUSION Bilateral 11/05/2016   Procedure: FRONTAL RECESS EXPLORATION;  Surgeon: Christia Reading, MD;  Location: [facility] OR;  Service: ENT;  Laterality: Bilateral;  BILATERAL ENDOSCOPIC SINUS SURGERY WITH FUSION   SINUS EXPLORATION  11/05/2016   SPHENOIDECTOMY Bilateral 11/05/2016   Procedure: SPHENOIDECTOMY;  Surgeon: Christia Reading, MD;  Location: [facility] OR;  Service: ENT;  Laterality: Bilateral;   Family History:  Family History  Problem Relation Age of Onset   Heart disease Mother    Hyperlipidemia Mother    Hypertension Mother    Hypertension Father    Osteoarthritis Father    Stroke Father    Alzheimer's disease Sister    Alzheimer's disease Brother    Brain cancer Brother    Healthy Daughter    Healthy Son    Breast cancer Neg Hx  Family Psychiatric  History: See previous Social History:  Social History   Substance and Sexual Activity  Alcohol Use Yes   Comment: rarely     Social History   Substance and Sexual Activity  Drug Use No    Social History   Socioeconomic History   Marital status: Widowed    Spouse name: Not on file   Number of children: Not on file   Years of education: Not on file   Highest education level: Not on file  Occupational History   Not on file  Tobacco Use   Smoking status: Never   Smokeless tobacco: Never  Vaping Use    Vaping Use: Never used  Substance and Sexual Activity   Alcohol use: Yes    Comment: rarely   Drug use: No   Sexual activity: Not Currently  Other Topics Concern   Not on file  Social History Narrative   Daily Caffeine Use:  1-2 coffee   Regular Exercise -  NO   Social Determinants of Health   Financial Resource Strain: Low Risk  (03/12/2022)   Overall Financial Resource Strain (CARDIA)    Difficulty of Paying Living Expenses: Not hard at all  Food Insecurity: No Food Insecurity (09/09/2022)   Hunger Vital Sign    Worried About Running Out of Food in the Last Year: Never true    Ran Out of Food in the Last Year: Never true  Transportation Needs: No Transportation Needs (09/09/2022)   PRAPARE - Administrator, Civil Service (Medical): No    Lack of Transportation (Non-Medical): No  Physical Activity: Insufficiently Active (03/12/2022)   Exercise Vital Sign    Days of Exercise per Week: 2 days    Minutes of Exercise per Session: 20 min  Stress: No Stress Concern Present (03/12/2022)   [employer] of Occupational Health - Occupational Stress Questionnaire    Feeling of Stress : Not at all  Social Connections: Unknown (03/12/2022)   Social Connection and Isolation Panel [NHANES]    Frequency of Communication with Friends and Family: More than three times a week    Frequency of Social Gatherings with Friends and Family: More than three times a week    Attends Religious Services: More than 4 times per year    Active Member of [employer] or Organizations: Not on file    Attends Banker Meetings: Not on file    Marital Status: Widowed   Additional Social History:                         Sleep: Fair  Appetite:  Fair  Current Medications: Current Facility-Administered Medications  Medication Dose Route Frequency Provider Last Rate Last Admin   acetaminophen (TYLENOL) tablet 650 mg  650 mg Oral Q6H PRN Sarina Ill, DO   650 mg at  09/13/22 1404   albuterol (PROVENTIL) (2.5 MG/3ML) 0.083% nebulizer solution 2.5 mg  2.5 mg Inhalation Q6H PRN Sarina Ill, DO       ALPRAZolam Prudy Feeler) tablet 0.25 mg  0.25 mg Oral TID PRN Sarina Ill, DO   0.25 mg at 09/12/22 2304   alum & mag hydroxide-simeth (MAALOX/MYLANTA) 200-200-20 MG/5ML suspension 30 mL  30 mL Oral Q4H PRN Sarina Ill, DO       buPROPion (WELLBUTRIN XL) 24 hr tablet 150 mg  150 mg Oral Daily Sarina Ill, DO   150 mg at 09/13/22 0900   celecoxib (  CELEBREX) capsule 100 mg  100 mg Oral BID Sarina Ill, DO   100 mg at 09/13/22 0901   diclofenac Sodium (VOLTAREN) 1 % topical gel 2 g  2 g Topical QID PRN Sarina Ill, DO   2 g at 09/13/22 0017   FLUoxetine (PROZAC) capsule 60 mg  60 mg Oral Daily Sarina Ill, DO   60 mg at 09/13/22 0901   fluticasone (FLONASE) 50 MCG/ACT nasal spray 1 spray  1 spray Each Nare Daily Sarina Ill, DO   1 spray at 09/13/22 4944   hydrochlorothiazide (HYDRODIURIL) tablet 25 mg  25 mg Oral Daily Sarina Ill, DO   25 mg at 09/13/22 0900   loratadine (CLARITIN) tablet 10 mg  10 mg Oral Daily Sarina Ill, DO   10 mg at 09/13/22 0900   magnesium hydroxide (MILK OF MAGNESIA) suspension 30 mL  30 mL Oral Daily PRN Sarina Ill, DO       methocarbamol (ROBAXIN) tablet 500 mg  500 mg Oral Q6H PRN Sarina Ill, DO   500 mg at 09/11/22 2108   pantoprazole (PROTONIX) EC tablet 40 mg  40 mg Oral Daily Sarina Ill, DO   40 mg at 09/13/22 0901   pindolol (VISKEN) tablet 5 mg  5 mg Oral BID Sarina Ill, DO   5 mg at 09/13/22 0900   rosuvastatin (CRESTOR) tablet 40 mg  40 mg Oral Daily Sarina Ill, DO   40 mg at 09/13/22 0900   senna (SENOKOT) tablet 8.6 mg  1 tablet Oral Daily Sarina Ill, DO   8.6 mg at 09/13/22 0900   SUMAtriptan (IMITREX) tablet 50 mg  50 mg Oral Q2H PRN  Sarina Ill, DO   50 mg at 09/10/22 2220   traMADol (ULTRAM) tablet 50 mg  50 mg Oral Q6H PRN Sarina Ill, DO       traZODone (DESYREL) tablet 50 mg  50 mg Oral QHS Sarina Ill, DO   50 mg at 09/12/22 2140   zolpidem (AMBIEN) tablet 5 mg  5 mg Oral QHS PRN Sarina Ill, DO   5 mg at 09/12/22 2305    Lab Results: No results found for this or any previous visit (from the past 48 hour(s)).  Blood Alcohol level:  Lab Results  Component Value Date   ETH <10 09/08/2022    Metabolic Disorder Labs: Lab Results  Component Value Date   HGBA1C 5.9 07/21/2022   No results found for: "PROLACTIN" Lab Results  Component Value Date   CHOL 174 07/21/2022   TRIG 135.0 07/21/2022   HDL 76.00 07/21/2022   CHOLHDL 2 07/21/2022   VLDL 27.0 07/21/2022   LDLCALC 71 07/21/2022   LDLCALC 63 07/01/2021    Physical Findings: AIMS:  , ,  ,  ,    CIWA:    COWS:     Musculoskeletal: Strength & Muscle Tone: within normal limits Gait & Station: normal Patient leans: N/A  Psychiatric Specialty Exam:  Presentation  General Appearance:  Appropriate for Environment  Eye Contact: Good  Speech: Clear and Coherent  Speech Volume: Normal  Handedness: Right   Mood and Affect  Mood: Depressed  Affect: Depressed; Flat; Tearful   Thought Process  Thought Processes: Coherent  Descriptions of Associations:Intact  Orientation:Full (Time, Place and Person)  Thought Content:Logical; Tangential  History of Schizophrenia/Schizoaffective disorder:No  Duration of Psychotic Symptoms:No data recorded Hallucinations:No data recorded Ideas of Reference:None  Suicidal Thoughts:No data recorded Homicidal Thoughts:No data recorded  Sensorium  Memory: Immediate Good; Recent Good; Remote Good  Judgment: Poor  Insight: Poor   Executive Functions  Concentration: Fair  Attention Span: Fair  Recall: Good  Fund of  Knowledge: Good  Language: Good   Psychomotor Activity  Psychomotor Activity:No data recorded  Assets  Assets: Communication Skills; Desire for Improvement; Physical Health; Resilience; Social Support   Sleep  Sleep:No data recorded   Physical Exam: Physical Exam Vitals and nursing note reviewed.  Constitutional:      Appearance: Normal appearance.  HENT:     Head: Normocephalic and atraumatic.     Mouth/Throat:     Pharynx: Oropharynx is clear.  Eyes:     Pupils: Pupils are equal, round, and reactive to light.  Cardiovascular:     Rate and Rhythm: Normal rate and regular rhythm.  Pulmonary:     Effort: Pulmonary effort is normal.     Breath sounds: Normal breath sounds.  Abdominal:     General: Abdomen is flat.     Palpations: Abdomen is soft.  Musculoskeletal:        General: Normal range of motion.  Skin:    General: Skin is warm and dry.  Neurological:     General: No focal deficit present.     Mental Status: She is alert. Mental status is at baseline.  Psychiatric:        Attention and Perception: Attention normal.        Mood and Affect: Mood normal.        Speech: Speech normal.        Behavior: Behavior normal.        Thought Content: Thought content normal.        Cognition and Memory: Cognition normal.    Review of Systems  Constitutional: Negative.   HENT: Negative.    Eyes: Negative.   Respiratory: Negative.    Cardiovascular: Negative.   Gastrointestinal: Negative.   Musculoskeletal: Negative.   Skin: Negative.   Neurological: Negative.   Psychiatric/Behavioral: Negative.     Blood pressure 118/69, pulse 65, temperature 98.1 F (36.7 C), temperature source Oral, resp. rate 18, SpO2 93 %. There is no height or weight on file to calculate BMI.   Treatment Plan Summary: Medication management and Plan no change to medication.  Supportive counseling.  This is a couple days in the [facility] that she has felt much better with no suicidal thought  and much more optimistic view on the future.  Might be ready to consider discharge as early as tomorrow.  Mordecai Rasmussen, MD 09/13/2022, 2:05 PM

## 2022-09-13 NOTE — Progress Notes (Signed)
Patient pleasant and cooperative.  Denies anxiety this morning.  Endorses feelings of depression.  Denies SI/HI and AVH.  Pain 6/10 in knees.  PRN medication given as ordered.  Patient reports she slept well and has a good appetite. 15 min checks in place. Compliant with scheduled medications.  Appropriate interaction with peers and staff.  No behavioral issues.   2 PRNs given for pain in knees and a headache.

## 2022-09-13 NOTE — Group Note (Signed)
Date:  09/13/2022 Time:  8:53 PM  Group Topic/Focus:  Healthy Communication:   The focus of this group is to discuss communication, barriers to communication, as well as healthy ways to communicate with others.    Participation Level:  Did Not Attend  Participation Quality:      Affect:      Cognitive:      Insight: None  Engagement in Group:      Modes of Intervention:      Additional Comments:  Recommended all patients to attend group at 8:30pm. Patients either refused or asleep.   Dana Obrien 09/13/2022, 8:53 PM  

## 2022-09-13 NOTE — Group Note (Signed)
Date:  09/13/2022 Time:  11:39 AM  Group Topic/Focus:  Coping With Mental Health Crisis:   The purpose of this group is to help patients identify strategies for coping with mental health crisis.  Group discusses possible causes of crisis and ways to manage them effectively.    Participation Level:  Did Not Attend  Participation Quality:   Did not attend  Affect:   Did not attend  Cognitive:   Did not attend  Insight: None did not attend  Engagement in Group:   Did not attend  Modes of Intervention:  Discussion, Socialization, and Support  Additional Comments:  Group activity to discuss healthy coping skills and support systems available for coping  Hall Busing 09/13/2022, 11:39 AM

## 2022-09-13 NOTE — Plan of Care (Signed)
  Problem: Education: Goal: Knowledge of General Education information will improve Description: Including pain rating scale, medication(s)/side effects and non-pharmacologic comfort measures Outcome: Progressing   Problem: Activity: Goal: Risk for activity intolerance will decrease Outcome: Progressing   Problem: Nutrition: Goal: Adequate nutrition will be maintained Outcome: Progressing   Problem: Safety: Goal: Ability to remain free from injury will improve Outcome: Progressing   Problem: Coping: Goal: Level of anxiety will decrease Outcome: Not Progressing

## 2022-09-13 NOTE — Progress Notes (Signed)
Patient resting quietly in bed with eyes closed, Respirations equal and unlabored, skin warm and dry, NAD. Routine safety checks conducted according to facility protocol. Will continue to monitor for safety. 

## 2022-09-14 DIAGNOSIS — F322 Major depressive disorder, single episode, severe without psychotic features: Secondary | ICD-10-CM | POA: Diagnosis not present

## 2022-09-14 NOTE — Progress Notes (Signed)
   09/14/22 0730  Psych Admission Type (Psych Patients Only)  Admission Status Involuntary  Psychosocial Assessment  Patient Complaints Worrying  Eye Contact Fair  Facial Expression Anxious;Worried  Affect Appropriate to circumstance  Surveyor, minerals Activity Slow  Appearance/Hygiene Unremarkable  Behavior Characteristics Cooperative  Mood Pleasant  Thought Process  Coherency WDL  Content WDL  Delusions None reported or observed  Perception WDL  Hallucination None reported or observed  Judgment WDL  Confusion None  Danger to Self  Current suicidal ideation? Denies

## 2022-09-14 NOTE — Progress Notes (Signed)
Patient received xanax 0.25 mg prn for anxiety and Ambien 5 mg for sleep and was effective. patient resting quietly in bed with eyes closed, Respirations equal and unlabored, skin warm and dry, NAD. Routine safety checks conducted according to facility protocol. Will continue to monitor for safety.

## 2022-09-14 NOTE — Progress Notes (Signed)
[facility] MD Progress Note  09/14/2022 12:43 PM SATOYA FEELEY  MRN:  [id number] Subjective: Dana Obrien is seen on rounds.  She is in a better mood.  When she came in and we restarted her Prozac 60 mg and I added pindolol 5 mg twice a day.  Her vital signs are stable.  She says that she is ready to go home but I told her we need a little bit more time to get her outpatient follow-up organized.  She is also a commitment and I think she needs more time to stay away from her chaotic household.  I also started her on Wellbutrin XL and Ambien as needed. 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   Cancer 2004   right lumpectomy, chemotherapy and radiation Dr. Koleen Nimrod   CPAP (continuous positive airway pressure) dependence    Depression    GERD (gastroesophageal reflux disease)    Hypertension    Migraine    Personal history of chemotherapy 2004   Personal history of radiation therapy 2004   Personal history of radiation therapy 2019   PONV (postoperative nausea and vomiting)    Shingles    Sleep apnea    uses CPAP, severe OSA   Vitamin D deficiency    IN THE PAST    Past Surgical History:  Procedure Laterality Date   ABDOMINAL HYSTERECTOMY     ANTERIOR AND POSTERIOR REPAIR     BREAST BIOPSY Right 2004   stereo. 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  3.5 cm maximum diameter, minimal margins 5 mm.  In situ component less than 10%.   BREAST LUMPECTOMY Right 2004   BREAST LUMPECTOMY Left 09/17/2017   Procedure: BREAST EXCISION, SENTINEL NODE BIOPSY;  Surgeon: Earline Mayotte, MD;  Location: [facility] ORS;  Service: General;  Laterality: Left;   COLONOSCOPY  2015   COLONOSCOPY WITH PROPOFOL N/A 10/04/2019   Procedure: COLONOSCOPY WITH PROPOFOL;  Surgeon: Earline Mayotte, MD;  Location: [facility] ENDOSCOPY;  Service: Endoscopy;  Laterality: N/A;   DILATION AND CURETTAGE OF UTERUS     ETHMOIDECTOMY Bilateral 11/05/2016   Procedure: ETHMOIDECTOMY;  Surgeon: Christia Reading, MD;  Location[facility] OR;  Service: ENT;  Laterality: Bilateral;   LAPAROTOMY     MAXILLARY ANTROSTOMY Bilateral 11/05/2016   Procedure: MAXILLARY ANTROSTOMY;  Surgeon: Christia Reading, MD;  Location: [facility] OR;  Service: ENT;  Laterality: Bilateral;   SINUS ENDO W/FUSION Bilateral 11/05/2016   Procedure: FRONTAL RECESS EXPLORATION;  Surgeon: Christia Reading, MD;  Location: [facility] OR;  Service: ENT;  Laterality: Bilateral;  BILATERAL ENDOSCOPIC SINUS SURGERY WITH FUSION   SINUS EXPLORATION  11/05/2016   SPHENOIDECTOMY Bilateral 11/05/2016   Procedure: SPHENOIDECTOMY;  Surgeon: Christia Reading, MD;  Location: [facility] OR;  Service: ENT;  Laterality: Bilateral;   Family History:  Family History  Problem Relation Age of Onset   Heart disease Mother    Hyperlipidemia Mother    Hypertension Mother    Hypertension Father    Osteoarthritis Father    Stroke Father    Alzheimer's disease Sister    Alzheimer's disease Brother    Brain cancer Brother    Healthy Daughter    Healthy Son    Breast cancer Neg Hx    Family Psychiatric  History: Unremarkable Social History:  Social History   Substance and Sexual Activity  Alcohol Use Yes   Comment: rarely     Social History   Substance and Sexual Activity  Drug Use No     Social History   Socioeconomic History   Marital status: Widowed    Spouse name: Not on file   Number of children: Not on file   Years of education: Not on file   Highest education level: Not on file  Occupational History   Not on file  Tobacco Use   Smoking status: Never   Smokeless tobacco: Never  Vaping Use   Vaping Use: Never used  Substance and Sexual Activity   Alcohol use: Yes    Comment: rarely   Drug use: No   Sexual activity: Not Currently  Other Topics Concern   Not on file  Social History Narrative   Daily Caffeine Use:  1-2 coffee   Regular Exercise -  NO   Social Determinants of Health   Financial Resource Strain: Low Risk  (03/12/2022)   Overall Financial Resource Strain (CARDIA)    Difficulty of Paying Living Expenses: Not hard at all  Food Insecurity: No Food Insecurity (09/09/2022)   Hunger Vital Sign    Worried About Running Out of Food in the Last Year: Never true    Ran Out of Food in the Last Year: Never true  Transportation Needs: No Transportation Needs (09/09/2022)   PRAPARE - Administrator, Civil Service (Medical): No    Lack of Transportation (Non-Medical): No  Physical Activity: Insufficiently Active (03/12/2022)   Exercise Vital Sign    Days of Exercise per Week: 2 days    Minutes of Exercise per Session: 20 min  Stress: No Stress Concern Present (03/12/2022)   [employer] of Occupational Health - Occupational Stress Questionnaire    Feeling of Stress : Not at all  Social Connections: Unknown (03/12/2022)   Social Connection and Isolation Panel [NHANES]    Frequency of Communication with Friends and Family: More than three times a week    Frequency of Social Gatherings with Friends and Family: More than three times a week    Attends Religious Services: More than 4 times per year    Active Member of [employer] or Organizations: Not on file    Attends Banker Meetings: Not on file    Marital Status: Widowed    Additional Social History:                         Sleep: Good  Appetite:  Good  Current Medications: Current Facility-Administered Medications  Medication Dose Route Frequency Provider Last Rate Last Admin   acetaminophen (TYLENOL) tablet 650 mg  650 mg Oral Q6H PRN Sarina Ill, DO   650 mg at 09/13/22 1404   albuterol (PROVENTIL) (2.5 MG/3ML) 0.083% nebulizer solution 2.5 mg  2.5 mg Inhalation Q6H PRN Sarina Ill, DO       ALPRAZolam Prudy Feeler)  tablet 0.25 mg  0.25 mg Oral TID PRN Sarina Ill, DO   0.25 mg at 09/14/22 0820   alum & mag hydroxide-simeth (MAALOX/MYLANTA) 200-200-20 MG/5ML suspension 30 mL  30 mL Oral Q4H PRN Sarina Ill, DO       buPROPion (WELLBUTRIN XL) 24 hr tablet 150 mg  150 mg Oral Daily Sarina Ill, DO   150 mg at 09/14/22 1610   celecoxib (CELEBREX) capsule 100 mg  100 mg Oral BID Sarina Ill, DO   100 mg at 09/14/22 9604   diclofenac Sodium (VOLTAREN) 1 % topical gel 2 g  2 g Topical QID PRN Sarina Ill, DO   2 g at 09/13/22 5409   FLUoxetine (PROZAC) capsule 60 mg  60 mg Oral Daily Sarina Ill, DO   60 mg at 09/14/22 0820   fluticasone (FLONASE) 50 MCG/ACT nasal spray 1 spray  1 spray Each Nare Daily Sarina Ill, DO   1 spray at 09/14/22 8119   hydrochlorothiazide (HYDRODIURIL) tablet 25 mg  25 mg Oral Daily Sarina Ill, DO   25 mg at 09/14/22 0820   loratadine (CLARITIN) tablet 10 mg  10 mg Oral Daily Sarina Ill, DO   10 mg at 09/14/22 1478   magnesium hydroxide (MILK OF MAGNESIA) suspension 30 mL  30 mL Oral Daily PRN Sarina Ill, DO       methocarbamol (ROBAXIN) tablet 500 mg  500 mg Oral Q6H PRN Sarina Ill, DO   500 mg at 09/11/22 2108   pantoprazole (PROTONIX) EC tablet 40 mg  40 mg Oral Daily Sarina Ill, DO   40 mg at 09/14/22 0820   pindolol (VISKEN) tablet 5 mg  5 mg Oral BID  Sarina Ill, DO   5 mg at 09/14/22 2956   rosuvastatin (CRESTOR) tablet 40 mg  40 mg Oral Daily Sarina Ill, DO   40 mg at 09/14/22 2130   senna (SENOKOT) tablet 8.6 mg  1 tablet Oral Daily Sarina Ill, DO   8.6 mg at 09/14/22 0820   SUMAtriptan (IMITREX) tablet 50 mg  50 mg Oral Q2H PRN Sarina Ill, DO   50 mg at 09/10/22 2220   traMADol (ULTRAM) tablet 50 mg  50 mg Oral Q6H PRN Sarina Ill, DO       traZODone (DESYREL) tablet 50 mg  50 mg Oral QHS Sarina Ill, DO   50 mg at 09/13/22 2100   zolpidem (AMBIEN) tablet 5 mg  5 mg Oral QHS PRN Sarina Ill, DO   5 mg at 09/13/22 2222    Lab Results: No results found for this or any previous visit (from the past 48 hour(s)).  Blood Alcohol level:  Lab Results  Component Value Date   ETH <10 09/08/2022    Metabolic Disorder Labs: Lab Results  Component Value Date   HGBA1C 5.9 07/21/2022   No results found for: "PROLACTIN" Lab Results  Component Value Date   CHOL 174 07/21/2022   TRIG 135.0 07/21/2022   HDL 76.00 07/21/2022   CHOLHDL 2 07/21/2022   VLDL 27.0 07/21/2022   LDLCALC 71 07/21/2022   LDLCALC 63 07/01/2021    Physical Findings: AIMS:  , ,  ,  ,    CIWA:    COWS:     Musculoskeletal: Strength & Muscle Tone: within normal limits Gait & Station: normal Patient leans: N/A  Psychiatric Specialty Exam:  Presentation  General Appearance:  Appropriate for Environment  Eye Contact: Good  Speech: Clear and Coherent  Speech Volume: Normal  Handedness: Right   Mood and Affect  Mood: Depressed  Affect: Depressed; Flat; Tearful   Thought Process  Thought Processes: Coherent  Descriptions of Associations:Intact  Orientation:Full (Time, Place and Person)  Thought Content:Logical; Tangential  History of Schizophrenia/Schizoaffective disorder:No  Duration of Psychotic Symptoms:No data recorded Hallucinations:No data  recorded Ideas of Reference:None  Suicidal Thoughts:No data recorded Homicidal Thoughts:No data recorded  Sensorium  Memory: Immediate Good; Recent Good; Remote Good  Judgment: Poor  Insight: Poor   Executive Functions  Concentration: Fair  Attention Span: Fair  Recall: Good  Fund of Knowledge: Good  Language: Good   Psychomotor Activity  Psychomotor Activity:No data recorded  Assets  Assets: Communication Skills; Desire for Improvement; Physical Health; Resilience; Social Support   Sleep  Sleep:No data recorded   Physical Exam: Physical Exam Vitals and nursing note reviewed.  Constitutional:      Appearance: Normal appearance. She is normal weight.  Neurological:     General: No focal deficit present.     Mental Status: She is alert and oriented to person, place, and time.  Psychiatric:        Attention and Perception: Attention and perception normal.        Mood and Affect: Mood and affect normal.        Speech: Speech normal.        Behavior: Behavior normal. Behavior is cooperative.        Thought Content: Thought content normal.        Cognition and Memory: Cognition and memory normal.        Judgment: Judgment normal.    Review of Systems  Constitutional: Negative.   HENT: Negative.    Eyes: Negative.   Respiratory: Negative.    Cardiovascular: Negative.   Gastrointestinal: Negative.   Genitourinary: Negative.   Musculoskeletal: Negative.   Skin: Negative.   Neurological: Negative.   Endo/Heme/Allergies: Negative.   Psychiatric/Behavioral:  Positive for depression.    Blood pressure 122/69, pulse 66, temperature (!) 95.3 F (35.2 C), resp. rate 16, SpO2 98 %. There is no height or weight on file to calculate BMI.   Treatment Plan Summary: Daily contact with patient to assess and evaluate symptoms and progress in treatment, Medication management, and Plan continue current medications.  Sarina Ill, DO 09/14/2022,  12:43 PM

## 2022-09-14 NOTE — Group Note (Signed)
[facility] LCSW Group Therapy Note    Group Date: 09/14/2022 Start Time: 1330 End Time: 1400  Type of Therapy and Topic:  Group Therapy:  Overcoming Obstacles  Participation Level:  [facility] PARTICIPATION LEVEL: Did Not Attend  Mood:  Description of Group:   In this group patients will be encouraged to explore what they see as obstacles to their own wellness and recovery. They will be guided to discuss their thoughts, feelings, and behaviors related to these obstacles. The group will process together ways to cope with barriers, with attention given to specific choices patients can make. Each patient will be challenged to identify changes they are motivated to make in order to overcome their obstacles. This group will be process-oriented, with patients participating in exploration of their own experiences as well as giving and receiving support and challenge from other group members.  Therapeutic Goals: 1. Patient will identify personal and current obstacles as they relate to admission. 2. Patient will identify barriers that currently interfere with their wellness or overcoming obstacles.  3. Patient will identify feelings, thought process and behaviors related to these barriers. 4. Patient will identify two changes they are willing to make to overcome these obstacles:    Summary of Patient Progress   Patient declined to attend group.   Therapeutic Modalities:   Cognitive Behavioral Therapy Solution Focused Therapy Motivational Interviewing Relapse Prevention Therapy   Pratyush Ammon J Chiyo Fay, LCSW 

## 2022-09-14 NOTE — Group Note (Signed)
Date:  09/14/2022 Time:  8:49 PM  Group Topic/Focus:  Building Self Esteem:   The Focus of this group is helping patients become aware of the effects of self-esteem on their lives, the things they and others do that enhance or undermine their self-esteem, seeing the relationship between their level of self-esteem and the choices they make and learning ways to enhance self-esteem. Developing a Wellness Toolbox:   The focus of this group is to help patients develop a "wellness toolbox" with skills and strategies to promote recovery upon discharge. Goals Group:   The focus of this group is to help patients establish daily goals to achieve during treatment and discuss how the patient can incorporate goal setting into their daily lives to aide in recovery. Healthy Communication:   The focus of this group is to discuss communication, barriers to communication, as well as healthy ways to communicate with others. Making Healthy Choices:   The focus of this group is to help patients identify negative/unhealthy choices they were using prior to admission and identify positive/healthier coping strategies to replace them upon discharge. Overcoming Stress:   The focus of this group is to define stress and help patients assess their triggers. Personal Choices and Values:   The focus of this group is to help patients assess and explore the importance of values in their lives, how their values affect their decisions, how they express their values and what opposes their expression.    Participation Level:  Active  Participation Quality:  Appropriate  Affect:  Appropriate  Cognitive:  Alert  Insight: Appropriate  Engagement in Group:  Engaged  Modes of Intervention:  Problem-solving  Additional Comments:    Maeola Harman 09/14/2022, 8:49 PM

## 2022-09-14 NOTE — Group Note (Signed)
Recreation Therapy Group Note   Group Topic:General Recreation  Group Date: 09/14/2022 Start Time: 1345 End Time: 1435 Facilitators: Rosina Lowenstein, LRT, CTRS Location: Courtyard  Group Description: Outdoor Recreation. Patients had the option to do seated exercise or play with a deck of cards while outside in the courtyard getting fresh air and sunlight. LRT played music in the background on the speaker. LRT and pts discussed things that they enjoy doing in their free time outside of the hospital.  Goal Area(s) Addressed: Patient will identify leisure interests.  Patient will practice healthy decision making. Patient will engage in recreation activity.   Affect/Mood: N/A   Participation Level: Did not attend    Clinical Observations/Individualized Feedback: Dana Obrien did not attend group despite encouragement. Pt told LRT that she was leaving today and did not feel like coming to group.   Plan: Continue to engage patient in RT group sessions 2-3x/week.   Rosina Lowenstein, LRT, CTRS 09/14/2022 3:16 PM

## 2022-09-15 DIAGNOSIS — F322 Major depressive disorder, single episode, severe without psychotic features: Secondary | ICD-10-CM | POA: Diagnosis not present

## 2022-09-15 NOTE — Plan of Care (Signed)
Pt endorses anxiety/depression at this time. Pt denies SI/HI/AVH however endorses 6/10 chronic bilat knee pain at this time, prn medication administered. Pt is calm and cooperative. Pt is medication compliant. Pt provided with support and encouragement. Pt monitored q15 minutes for safety per unit policy. Plan of care ongoing.   Problem: Education: Goal: Knowledge of General Education information will improve Description: Including pain rating scale, medication(s)/side effects and non-pharmacologic comfort measures Outcome: Progressing   Problem: Coping: Goal: Level of anxiety will decrease Outcome: Not Progressing

## 2022-09-15 NOTE — Group Note (Signed)
[facility] LCSW Group Therapy Note    Group Date: 09/15/2022 Start Time: 1300 End Time: 1315  Type of Therapy and Topic:  Group Therapy:  Overcoming Obstacles  Participation Level:  [facility] PARTICIPATION LEVEL: Did Not Attend  Mood:  Description of Group:   In this group patients will be encouraged to explore what they see as obstacles to their own wellness and recovery. They will be guided to discuss their thoughts, feelings, and behaviors related to these obstacles. The group will process together ways to cope with barriers, with attention given to specific choices patients can make. Each patient will be challenged to identify changes they are motivated to make in order to overcome their obstacles. This group will be process-oriented, with patients participating in exploration of their own experiences as well as giving and receiving support and challenge from other group members.  Therapeutic Goals: 1. Patient will identify personal and current obstacles as they relate to admission. 2. Patient will identify barriers that currently interfere with their wellness or overcoming obstacles.  3. Patient will identify feelings, thought process and behaviors related to these barriers. 4. Patient will identify two changes they are willing to make to overcome these obstacles:    Summary of Patient Progress Patient declined to attend group despite encouragement from this CSW.   Therapeutic Modalities:   Cognitive Behavioral Therapy Solution Focused Therapy Motivational Interviewing Relapse Prevention Therapy   Baylyn Sickles J Geanie Pacifico, LCSW 

## 2022-09-15 NOTE — Progress Notes (Signed)
[facility] MD Progress Note  09/15/2022 11:46 AM Dana Obrien  MRN:  [id number] Subjective: Dana Obrien is seen on rounds.  She is doing well on her current medications.  She is no longer having any suicidal thoughts.  She says that she thinks that she is able to return home without any problems.  Social work is still trying to set up her outpatient follow-up.  She denies any side effects from her medication.  She says that her mood has improved.  We talked about discharge tomorrow. 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   Cancer 2004   right lumpectomy, chemotherapy and radiation Dr. Koleen Nimrod   CPAP (continuous positive airway pressure) dependence    Depression    GERD (gastroesophageal reflux disease)    Hypertension    Migraine    Personal history of chemotherapy 2004   Personal history of radiation therapy 2004   Personal history of radiation therapy 2019   PONV (postoperative nausea and vomiting)    Shingles    Sleep apnea    uses CPAP, severe OSA   Vitamin D deficiency    IN THE PAST    Past Surgical History:  Procedure Laterality Date   ABDOMINAL HYSTERECTOMY     ANTERIOR AND POSTERIOR REPAIR     BREAST BIOPSY Right 2004   stereo. 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  3.5 cm maximum diameter, minimal margins 5 mm.  In situ component less than 10%.   BREAST LUMPECTOMY Right 2004   BREAST LUMPECTOMY Left 09/17/2017   Procedure: BREAST EXCISION, SENTINEL NODE BIOPSY;  Surgeon: Earline Mayotte, MD;  Location: [facility] ORS;  Service: General;  Laterality: Left;   COLONOSCOPY  2015   COLONOSCOPY WITH PROPOFOL N/A 10/04/2019   Procedure: COLONOSCOPY WITH PROPOFOL;  Surgeon: Earline Mayotte, MD;  Location: [facility] ENDOSCOPY;  Service: Endoscopy;  Laterality: N/A;   DILATION AND CURETTAGE OF UTERUS     ETHMOIDECTOMY Bilateral 11/05/2016   Procedure: ETHMOIDECTOMY;  Surgeon: Christia Reading, MD;  Location: [facility] OR;  Service: ENT;  Laterality: Bilateral;   LAPAROTOMY     MAXILLARY ANTROSTOMY Bilateral 11/05/2016   Procedure: MAXILLARY ANTROSTOMY;  Surgeon: Christia Reading, MD;  Location: [facility] OR;  Service: ENT;  Laterality: Bilateral;   SINUS ENDO W/FUSION Bilateral 11/05/2016   Procedure: FRONTAL RECESS EXPLORATION;  Surgeon: Christia Reading, MD;  Location: [facility] OR;  Service: ENT;  Laterality: Bilateral;  BILATERAL ENDOSCOPIC SINUS SURGERY WITH FUSION   SINUS EXPLORATION  11/05/2016   SPHENOIDECTOMY Bilateral 11/05/2016   Procedure: SPHENOIDECTOMY;  Surgeon: Christia Reading, MD;  Location: [facility] OR;  Service: ENT;  Laterality: Bilateral;   Family History:  Family History  Problem Relation Age of Onset   Heart disease Mother    Hyperlipidemia Mother    Hypertension Mother  Hypertension Father    Osteoarthritis Father    Stroke Father    Alzheimer's disease Sister    Alzheimer's disease Brother    Brain cancer Brother    Healthy Daughter    Healthy Son    Breast cancer Neg Hx    Family Psychiatric  History: Unremarkable Social History:  Social History   Substance and Sexual Activity  Alcohol Use Yes   Comment: rarely     Social History   Substance and Sexual Activity  Drug Use No    Social History   Socioeconomic History   Marital status: Widowed     Spouse name: Not on file   Number of children: Not on file   Years of education: Not on file   Highest education level: Not on file  Occupational History   Not on file  Tobacco Use   Smoking status: Never   Smokeless tobacco: Never  Vaping Use   Vaping Use: Never used  Substance and Sexual Activity   Alcohol use: Yes    Comment: rarely   Drug use: No   Sexual activity: Not Currently  Other Topics Concern   Not on file  Social History Narrative   Daily Caffeine Use:  1-2 coffee   Regular Exercise -  NO   Social Determinants of Health   Financial Resource Strain: Low Risk  (03/12/2022)   Overall Financial Resource Strain (CARDIA)    Difficulty of Paying Living Expenses: Not hard at all  Food Insecurity: No Food Insecurity (09/09/2022)   Hunger Vital Sign    Worried About Running Out of Food in the Last Year: Never true    Ran Out of Food in the Last Year: Never true  Transportation Needs: No Transportation Needs (09/09/2022)   PRAPARE - Administrator, Civil Service (Medical): No    Lack of Transportation (Non-Medical): No  Physical Activity: Insufficiently Active (03/12/2022)   Exercise Vital Sign    Days of Exercise per Week: 2 days    Minutes of Exercise per Session: 20 min  Stress: No Stress Concern Present (03/12/2022)   [employer] of Occupational Health - Occupational Stress Questionnaire    Feeling of Stress : Not at all  Social Connections: Unknown (03/12/2022)   Social Connection and Isolation Panel [NHANES]    Frequency of Communication with Friends and Family: More than three times a week    Frequency of Social Gatherings with Friends and Family: More than three times a week    Attends Religious Services: More than 4 times per year    Active Member of [employer] or Organizations: Not on file    Attends Banker Meetings: Not on file    Marital Status: Widowed   Additional Social History:                         Sleep:  Good  Appetite:  Good  Current Medications: Current Facility-Administered Medications  Medication Dose Route Frequency Provider Last Rate Last Admin   acetaminophen (TYLENOL) tablet 650 mg  650 mg Oral Q6H PRN Sarina Ill, DO   650 mg at 09/14/22 1548   albuterol (PROVENTIL) (2.5 MG/3ML) 0.083% nebulizer solution 2.5 mg  2.5 mg Inhalation Q6H PRN Sarina Ill, DO       ALPRAZolam Prudy Feeler) tablet 0.25 mg  0.25 mg Oral TID PRN Sarina Ill, DO   0.25 mg at 09/14/22 2123   alum & mag hydroxide-simeth (  MAALOX/MYLANTA) 200-200-20 MG/5ML suspension 30 mL  30 mL Oral Q4H PRN Sarina Ill, DO       buPROPion (WELLBUTRIN XL) 24 hr tablet 150 mg  150 mg Oral Daily Sarina Ill, DO   150 mg at 09/15/22 6803   celecoxib (CELEBREX) capsule 100 mg  100 mg Oral BID Sarina Ill, DO   100 mg at 09/15/22 2122   diclofenac Sodium (VOLTAREN) 1 % topical gel 2 g  2 g Topical QID PRN Sarina Ill, DO   2 g at 09/15/22 4825   FLUoxetine (PROZAC) capsule 60 mg  60 mg Oral Daily Sarina Ill, DO   60 mg at 09/15/22 0953   fluticasone (FLONASE) 50 MCG/ACT nasal spray 1 spray  1 spray Each Nare Daily Sarina Ill, DO   1 spray at 09/15/22 0955   loratadine (CLARITIN) tablet 10 mg  10 mg Oral Daily Sarina Ill, DO   10 mg at 09/15/22 0037   magnesium hydroxide (MILK OF MAGNESIA) suspension 30 mL  30 mL Oral Daily PRN Sarina Ill, DO       methocarbamol (ROBAXIN) tablet 500 mg  500 mg Oral Q6H PRN Sarina Ill, DO   500 mg at 09/11/22 2108   pantoprazole (PROTONIX) EC tablet 40 mg  40 mg Oral Daily Sarina Ill, DO   40 mg at 09/15/22 0488   pindolol (VISKEN) tablet 5 mg  5 mg Oral BID Sarina Ill, DO   5 mg at 09/15/22 8916   rosuvastatin (CRESTOR) tablet 40 mg  40 mg Oral Daily Sarina Ill, DO   40 mg at 09/15/22 9450   senna (SENOKOT) tablet 8.6 mg  1  tablet Oral Daily Sarina Ill, DO   8.6 mg at 09/15/22 3888   SUMAtriptan (IMITREX) tablet 50 mg  50 mg Oral Q2H PRN Sarina Ill, DO   50 mg at 09/15/22 0109   traMADol (ULTRAM) tablet 50 mg  50 mg Oral Q6H PRN Sarina Ill, DO       traZODone (DESYREL) tablet 50 mg  50 mg Oral QHS Sarina Ill, DO   50 mg at 09/14/22 2123   zolpidem (AMBIEN) tablet 5 mg  5 mg Oral QHS PRN Sarina Ill, DO   5 mg at 09/15/22 0109    Lab Results: No results found for this or any previous visit (from the past 48 hour(s)).  Blood Alcohol level:  Lab Results  Component Value Date   ETH <10 09/08/2022    Metabolic Disorder Labs: Lab Results  Component Value Date   HGBA1C 5.9 07/21/2022   No results found for: "PROLACTIN" Lab Results  Component Value Date   CHOL 174 07/21/2022   TRIG 135.0 07/21/2022   HDL 76.00 07/21/2022   CHOLHDL 2 07/21/2022   VLDL 27.0 07/21/2022   LDLCALC 71 07/21/2022   LDLCALC 63 07/01/2021    Physical Findings: AIMS:  , ,  ,  ,    CIWA:    COWS:     Musculoskeletal: Strength & Muscle Tone: within normal limits Gait & Station: normal Patient leans: N/A  Psychiatric Specialty Exam:  Presentation  General Appearance:  Appropriate for Environment  Eye Contact: Good  Speech: Clear and Coherent  Speech Volume: Normal  Handedness: Right   Mood and Affect  Mood: Depressed  Affect: Depressed; Flat; Tearful   Thought Process  Thought Processes: Coherent  Descriptions of Associations:Intact  Orientation:Full (  Time, Place and Person)  Thought Content:Logical; Tangential  History of Schizophrenia/Schizoaffective disorder:No  Duration of Psychotic Symptoms:No data recorded Hallucinations:No data recorded Ideas of Reference:None  Suicidal Thoughts:No data recorded Homicidal Thoughts:No data recorded  Sensorium  Memory: Immediate Good; Recent Good; Remote  Good  Judgment: Poor  Insight: Poor   Executive Functions  Concentration: Fair  Attention Span: Fair  Recall: Good  Fund of Knowledge: Good  Language: Good   Psychomotor Activity  Psychomotor Activity:No data recorded  Assets  Assets: Communication Skills; Desire for Improvement; Physical Health; Resilience; Social Support   Sleep  Sleep:No data recorded   Physical Exam: Physical Exam Vitals and nursing note reviewed.  Constitutional:      Appearance: Normal appearance. She is normal weight.  Neurological:     General: No focal deficit present.     Mental Status: She is alert and oriented to person, place, and time.  Psychiatric:        Attention and Perception: Attention and perception normal.        Mood and Affect: Mood is depressed. Affect is flat.        Speech: Speech normal.        Behavior: Behavior normal. Behavior is cooperative.        Thought Content: Thought content normal.        Cognition and Memory: Cognition and memory normal.        Judgment: Judgment normal.    Review of Systems  Constitutional: Negative.   HENT: Negative.    Eyes: Negative.   Respiratory: Negative.    Cardiovascular: Negative.   Gastrointestinal: Negative.   Genitourinary: Negative.   Musculoskeletal: Negative.   Skin: Negative.   Neurological: Negative.   Endo/Heme/Allergies: Negative.   Psychiatric/Behavioral: Negative.     Blood pressure 101/66, pulse 68, temperature 98.1 F (36.7 C), temperature source Oral, resp. rate 14, SpO2 95 %. There is no height or weight on file to calculate BMI.   Treatment Plan Summary: Daily contact with patient to assess and evaluate symptoms and progress in treatment, Medication management, and Plan continue current medications.  Plan on discharge tomorrow.  Sarina Ill, DO 09/15/2022, 11:46 AM

## 2022-09-15 NOTE — Progress Notes (Signed)
Around 3:42 nurses heard noise of a garbage can and ran down the hall to check on patients. Nurse found patient seated on floor facing the bed, water on floor and bed. Nurse asked patient if she fell, Patient responded saying " no I did not fall, I had a cup of water that I threw in the garbage can. That was the noise you heard." And when asked how she found herself on floor, patient said "I sat on floor." Patient alert and oriented x 4, denies a fall and no injury noted. Patient change her scrubs, went to the bathroom and urinated clear yellow urine , now in bed resting.

## 2022-09-15 NOTE — Group Note (Signed)
Recreation Therapy Group Note   Group Topic:Other  Group Date: 09/15/2022 Start Time: 1400 End Time: 1450 Facilitators: Rosina Lowenstein, LRT, CTRS Location: Courtyard  Group Description: Leisure. Patients were given the option to choose from playing with a deck of cards, following an audio meditation prompt or listen to music, while outside in the courtyard getting fresh air and sunlight, duration of session. LRT and pts discussed the importance of participating in leisure during their free time and when they're outside of the hospital. Pt identified two leisure interests and shared with the group.   Goal Area(s) Addressed:  Patient will identify a current leisure interest.  Patient will practice making a positive decision. Patient will have the opportunity to try a new leisure activity.  Affect/Mood: N/A   Participation Level: Did not attend    Clinical Observations/Individualized Feedback: Dana Obrien did not attend group due to resting in her room.  Plan: Continue to engage patient in RT group sessions 2-3x/week.   Rosina Lowenstein, LRT, CTRS 09/15/2022 3:19 PM

## 2022-09-15 NOTE — [facility] Group Notes (Signed)
[facility] Group Notes:  (Nursing/MHT/Case Management/Adjunct)  Date:  09/15/2022  Time:  1:30 PM  Type of Therapy:   community meeting  Participation Level:  Did Not Attend    [name] 09/15/2022, 1:30 PM

## 2022-09-16 DIAGNOSIS — F322 Major depressive disorder, single episode, severe without psychotic features: Secondary | ICD-10-CM | POA: Diagnosis not present

## 2022-09-16 MED ORDER — BUPROPION HCL ER (XL) 150 MG PO TB24: 150.0000 mg | ORAL_TABLET | Freq: Every day | ORAL | 3 refills | Status: DC

## 2022-09-16 MED ORDER — FLUOXETINE HCL 20 MG PO CAPS: 60.0000 mg | ORAL_CAPSULE | Freq: Every day | ORAL | 3 refills | Status: DC

## 2022-09-16 MED ORDER — TRAZODONE HCL 50 MG PO TABS: 50.0000 mg | ORAL_TABLET | Freq: Every day | ORAL | 3 refills | Status: DC

## 2022-09-16 MED ORDER — PINDOLOL 5 MG PO TABS: 5.0000 mg | ORAL_TABLET | Freq: Two times a day (BID) | ORAL | 3 refills | Status: DC

## 2022-09-16 NOTE — Discharge Summary (Signed)
Physician Discharge Summary Note  Patient:  Dana Obrien is an 77 y.o., female MRN:  [id number] DOB:  [birth year] Patient phone:  [phone] (home)  Patient address:   [id number] [place] [address],  Total Time spent with patient: 1 hour  Date of Admission:  09/09/2022 Date of Discharge: 09/16/2022  Reason for Admission:  Dana Obrien is a 77 year old white female who was involuntarily admitted to inpatient geriatric psychiatry for worsening depression and suicidal ideation.  Apparently, she wrote a suicide note to her son after experiencing a lot of family stressors.  There is a lot of drama going on in the house.  She is currently living with her son's wife his 2 children her daughter and her daughter's grandson.  The daughter has been experiencing much chaos in her life and the patient went to [facility] to pick her up after her husband left her daughter and her 30-year-old son.  She brought her back to [place] to live with her and now there is a lot of strife in the house with her son and daughter and the family dynamics.  She has never seen a psychiatrist in the past.  She has never been psychiatrically hospitalized.  She does not have any past suicide attempts.  She currently denies any suicidal ideation.  She states that she is just overwhelmed.  She is currently on Prozac 60 mg/day and she has been on it for most of her life she states.  Her PCP prescribes her medication.  She also endorses anhedonia, difficulty sleeping, extreme anxiety and panic attacks.  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  Her depression worsened her daughter moved back into the house approximately 6 weeks ago.   Principal Problem: Major depressive disorder, single episode, severe without psychosis Discharge Diagnoses: Principal Problem:   Major depressive disorder, single episode, severe without psychosis   Past Psychiatric History: History of  dysthymia but no past psychiatric hospitalizations.  No past outpatient treatment except for PCP.  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   Cancer 10-18-02   right lumpectomy, chemotherapy and radiation Dr. Koleen Nimrod   CPAP (continuous positive airway pressure) dependence    Depression    GERD (gastroesophageal reflux disease)    Hypertension    Migraine    Personal history of chemotherapy 18-Oct-2002   Personal history of radiation therapy 10/18/02   Personal history of radiation therapy 2017/10/17   PONV (postoperative nausea and vomiting)    Shingles    Sleep apnea    uses CPAP, severe OSA   Vitamin D deficiency    IN THE PAST    Past Surgical History:  Procedure Laterality Date   ABDOMINAL HYSTERECTOMY     ANTERIOR AND POSTERIOR REPAIR     BREAST BIOPSY Right 10/18/2002   stereo. 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  3.5 cm maximum diameter, minimal margins 5 mm.  In situ component less than 10%.   BREAST  LUMPECTOMY Right 2004   BREAST LUMPECTOMY Left 09/17/2017   Procedure: BREAST EXCISION, SENTINEL NODE BIOPSY;  Surgeon: Earline Mayotte, MD;  Location: [facility] ORS;  Service: General;  Laterality: Left;   COLONOSCOPY  2015   COLONOSCOPY WITH PROPOFOL N/A 10/04/2019   Procedure: COLONOSCOPY WITH PROPOFOL;  Surgeon: Earline Mayotte, MD;  Location: [facility] ENDOSCOPY;  Service: Endoscopy;  Laterality: N/A;   DILATION AND CURETTAGE OF UTERUS     ETHMOIDECTOMY Bilateral 11/05/2016   Procedure: ETHMOIDECTOMY;  Surgeon: Christia Reading, MD;  Location: [facility] OR;   Service: ENT;  Laterality: Bilateral;   LAPAROTOMY     MAXILLARY ANTROSTOMY Bilateral 11/05/2016   Procedure: MAXILLARY ANTROSTOMY;  Surgeon: Christia Reading, MD;  Location: [facility] OR;  Service: ENT;  Laterality: Bilateral;   SINUS ENDO W/FUSION Bilateral 11/05/2016   Procedure: FRONTAL RECESS EXPLORATION;  Surgeon: Christia Reading, MD;  Location: [facility] OR;  Service: ENT;  Laterality: Bilateral;  BILATERAL ENDOSCOPIC SINUS SURGERY WITH FUSION   SINUS EXPLORATION  11/05/2016   SPHENOIDECTOMY Bilateral 11/05/2016   Procedure: SPHENOIDECTOMY;  Surgeon: Christia Reading, MD;  Location: [facility] OR;  Service: ENT;  Laterality: Bilateral;   Family History:  Family History  Problem Relation Age of Onset   Heart disease Mother    Hyperlipidemia Mother    Hypertension Mother    Hypertension Father    Osteoarthritis Father    Stroke Father    Alzheimer's disease Sister    Alzheimer's disease Brother    Brain cancer Brother    Healthy Daughter    Healthy Son    Breast cancer Neg Hx    Family Psychiatric  History: Unremarkable Social History:  Social History   Substance and Sexual Activity  Alcohol Use Yes   Comment: rarely     Social History   Substance and Sexual Activity  Drug Use No    Social History   Socioeconomic History   Marital status: Widowed    Spouse name: Not on file   Number of children: Not on file   Years of education: Not on file   Highest education level: Not on file  Occupational History   Not on file  Tobacco Use   Smoking status: Never   Smokeless tobacco: Never  Vaping Use   Vaping Use: Never used  Substance and Sexual Activity   Alcohol use: Yes    Comment: rarely   Drug use: No   Sexual activity: Not Currently  Other Topics Concern   Not on file  Social History Narrative   Daily Caffeine Use:  1-2 coffee   Regular Exercise -  NO   Social Determinants of Health   Financial Resource Strain: Low Risk  (03/12/2022)   Overall Financial Resource Strain (CARDIA)     Difficulty of Paying Living Expenses: Not hard at all  Food Insecurity: No Food Insecurity (09/09/2022)   Hunger Vital Sign    Worried About Running Out of Food in the Last Year: Never true    Ran Out of Food in the Last Year: Never true  Transportation Needs: No Transportation Needs (09/09/2022)   PRAPARE - Administrator, Civil Service (Medical): No    Lack of Transportation (Non-Medical): No  Physical Activity: Insufficiently Active (03/12/2022)   Exercise Vital Sign    Days of Exercise per Week: 2 days    Minutes of Exercise per Session: 20 min  Stress: No Stress Concern Present (03/12/2022)   [employer] of Occupational Health - Occupational Stress Questionnaire  Feeling of Stress : Not at all  Social Connections: Unknown (03/12/2022)   Social Connection and Isolation Panel [NHANES]    Frequency of Communication with Friends and Family: More than three times a week    Frequency of Social Gatherings with Friends and Family: More than three times a week    Attends Religious Services: More than 4 times per year    Active Member of [employer] or Organizations: Not on file    Attends Banker Meetings: Not on file    Marital Status: Widowed    Hospital Course: Elvena was involuntarily admitted to geriatric psychiatry for worsening depression and suicidal thoughts.  She had written a suicide note that her son found.  She was brought to the emergency room and then transferred to psychiatry.  She has been on Prozac 60 mg for very long time.  Due to SSRI poop out I started her on pindolol 5 mg twice a day and due to her blood pressure we discontinued her hydrochlorothiazide.  Her vital signs remained stable.  She was started on Ambien as needed and trazodone.  Her Prozac remained at 60 mg and Wellbutrin XL 150 mg was added in the morning.  She did very well with the medications.  No side effects.  She interacted well with staff and peers.  It was felt that she  maximized hospitalization she was discharged home.  On the day of discharge she denied suicidal ideation, homicidal ideation, auditory or visual hallucinations.  Her judgment and insight were good.  Physical Findings: AIMS:  , ,  ,  ,    CIWA:    COWS:     Musculoskeletal: Strength & Muscle Tone: within normal limits Gait & Station: normal Patient leans: N/A   Psychiatric Specialty Exam:  Presentation  General Appearance:  Appropriate for Environment  Eye Contact: Good  Speech: Clear and Coherent  Speech Volume: Normal  Handedness: Right   Mood and Affect  Mood: Depressed  Affect: Depressed; Flat; Tearful   Thought Process  Thought Processes: Coherent  Descriptions of Associations:Intact  Orientation:Full (Time, Place and Person)  Thought Content:Logical; Tangential  History of Schizophrenia/Schizoaffective disorder:No  Duration of Psychotic Symptoms:No data recorded Hallucinations:No data recorded Ideas of Reference:None  Suicidal Thoughts:No data recorded Homicidal Thoughts:No data recorded  Sensorium  Memory: Immediate Good; Recent Good; Remote Good  Judgment: Poor  Insight: Poor   Executive Functions  Concentration: Fair  Attention Span: Fair  Recall: Good  Fund of Knowledge: Good  Language: Good   Psychomotor Activity  Psychomotor Activity:No data recorded  Assets  Assets: Communication Skills; Desire for Improvement; Physical Health; Resilience; Social Support   Sleep  Sleep:No data recorded   Physical Exam: Physical Exam Vitals and nursing note reviewed.  Constitutional:      Appearance: Normal appearance. She is normal weight.  Neurological:     General: No focal deficit present.     Mental Status: She is alert and oriented to person, place, and time.  Psychiatric:        Attention and Perception: Attention and perception normal.        Mood and Affect: Mood and affect normal.        Speech: Speech  normal.        Behavior: Behavior normal. Behavior is cooperative.        Thought Content: Thought content normal.        Cognition and Memory: Cognition and memory normal.  Judgment: Judgment normal.    Review of Systems  Constitutional: Negative.   HENT: Negative.    Eyes: Negative.   Respiratory: Negative.    Cardiovascular: Negative.   Gastrointestinal: Negative.   Genitourinary: Negative.   Musculoskeletal: Negative.   Skin: Negative.   Neurological: Negative.   Endo/Heme/Allergies: Negative.   Psychiatric/Behavioral: Negative.     Blood pressure 128/82, pulse 64, temperature 97.9 F (36.6 C), temperature source Oral, resp. rate 18, SpO2 97 %. There is no height or weight on file to calculate BMI.   Social History   Tobacco Use  Smoking Status Never  Smokeless Tobacco Never   Tobacco Cessation:  N/A, patient does not currently use tobacco products   Blood Alcohol level:  Lab Results  Component Value Date   ETH <10 09/08/2022    Metabolic Disorder Labs:  Lab Results  Component Value Date   HGBA1C 5.9 07/21/2022   No results found for: "PROLACTIN" Lab Results  Component Value Date   CHOL 174 07/21/2022   TRIG 135.0 07/21/2022   HDL 76.00 07/21/2022   CHOLHDL 2 07/21/2022   VLDL 27.0 07/21/2022   LDLCALC 71 07/21/2022   LDLCALC 63 07/01/2021    See Psychiatric Specialty Exam and Suicide Risk Assessment completed by Attending Physician prior to discharge.  Discharge destination:  Home  Is patient on multiple antipsychotic therapies at discharge:  No   Has Patient had three or more failed trials of antipsychotic monotherapy by history:  No  Recommended Plan for Multiple Antipsychotic Therapies: NA   Allergies as of 09/16/2022       Reactions   Lidocaine Palpitations   Codeine Nausea Only, Nausea And Vomiting   Morphine Nausea Only   Niacin Rash   Niacin And Related Rash   ANTIHYPERLIPEMICS        Medication List     STOP taking  these medications    FLUoxetine HCl 60 MG Tabs Replaced by: FLUoxetine 20 MG capsule   hydrochlorothiazide 25 MG tablet Commonly known as: HYDRODIURIL       TAKE these medications      Indication  albuterol 108 (90 Base) MCG/ACT inhaler Commonly known as: VENTOLIN HFA Inhale 2 puffs into the lungs every 6 (six) hours as needed for wheezing or shortness of breath.    ALPRAZolam 0.25 MG tablet Commonly known as: XANAX Take 1 tablet (0.25 mg total) by mouth 2 (two) times daily as needed. for anxiety    buPROPion 150 MG 24 hr tablet Commonly known as: WELLBUTRIN XL Take 1 tablet (150 mg total) by mouth daily. Start taking on: September 17, 2022  Indication: Major Depressive Disorder   celecoxib 100 MG capsule Commonly known as: CELEBREX Take 1 capsule by mouth twice daily    EQ Allergy Relief (Cetirizine) 10 MG tablet Generic drug: cetirizine Take 1 tablet by mouth once daily    EQ Arthritis Pain Reliever 1 % Gel Generic drug: diclofenac Sodium APPLY 2 GRAMS TOPICALLY FOUR TIMES DAILY AS NEEDED    FISH OIL PO Take 1 tablet by mouth daily.    FLUoxetine 20 MG capsule Commonly known as: PROZAC Take 3 capsules (60 mg total) by mouth daily. Start taking on: September 17, 2022 Replaces: FLUoxetine HCl 60 MG Tabs  Indication: Depression   fluticasone 50 MCG/ACT nasal spray Commonly known as: FLONASE Place 1 spray into both nostrils daily.    methocarbamol 500 MG tablet Commonly known as: ROBAXIN TAKE 1 TABLET BY MOUTH EVERY 8 HOURS AS NEEDED  FOR MUSCLE SPASM    Multi-Vitamin tablet Take 1 tablet by mouth daily.    ondansetron 4 MG disintegrating tablet Commonly known as: ZOFRAN-ODT Take 1 tablet (4 mg total) by mouth every 8 (eight) hours as needed for nausea or vomiting.    ondansetron 4 MG tablet Commonly known as: Zofran Take 1 tablet (4 mg total) by mouth every 8 (eight) hours as needed for nausea or vomiting.    pantoprazole 40 MG tablet Commonly known as:  PROTONIX Take 1 tablet by mouth once daily    pindolol 5 MG tablet Commonly known as: VISKEN Take 1 tablet (5 mg total) by mouth 2 (two) times daily.  Indication: High Blood Pressure Disorder, Depression   predniSONE 50 MG tablet Commonly known as: DELTASONE Take 1 tablet (50 mg total) by mouth daily with breakfast.    rosuvastatin 40 MG tablet Commonly known as: CRESTOR Take 1 tablet (40 mg total) by mouth daily.    senna 8.6 MG Tabs tablet Commonly known as: SENOKOT Take 1 tablet by mouth at bedtime.    SUMAtriptan 50 MG tablet Commonly known as: Imitrex Take 1 tablet (50 mg total) by mouth every 2 (two) hours as needed for migraine. May repeat in 2 hours if headache persists or recurs. No more than 2 doses in a 24 hour time frame.    traMADol 50 MG tablet Commonly known as: ULTRAM TAKE 1 TABLET BY MOUTH EVERY 6 HOURS AS NEEDED    traZODone 50 MG tablet Commonly known as: DESYREL Take 1 tablet (50 mg total) by mouth at bedtime. What changed: See the new instructions.  Indication: Trouble Sleeping, Major Depressive Disorder   tretinoin 0.05 % cream Commonly known as: RETIN-A Apply 1 application topically at bedtime.         Follow-up Information     [facility] Follow up.   Why: Appointment is scheduled for 09/22/2022 at 3:00PM.  Appointments start out with therapy and once established they can refer you to see the medication providers. Contact information: [address] [place] [address] [phone]                 Follow-up recommendations:  PCP and [facility]    Signed: Sarina Ill, DO 09/16/2022, 10:53 AM

## 2022-09-16 NOTE — B[facility] Suicide Risk Assessment (Signed)
[facility] Discharge Suicide Risk Assessment   Principal Problem: Major depressive disorder, single episode, severe without psychosis Discharge Diagnoses: Principal Problem:   Major depressive disorder, single episode, severe without psychosis   Total Time spent with patient: 1 hour  Musculoskeletal: Strength & Muscle Tone: within normal limits Gait & Station: normal Patient leans: N/A  Psychiatric Specialty Exam  Presentation  General Appearance:  Appropriate for Environment  Eye Contact: Good  Speech: Clear and Coherent  Speech Volume: Normal  Handedness: Right   Mood and Affect  Mood: Depressed  Duration of Depression Symptoms: Greater than two weeks  Affect: Depressed; Flat; Tearful   Thought Process  Thought Processes: Coherent  Descriptions of Associations:Intact  Orientation:Full (Time, Place and Person)  Thought Content:Logical; Tangential  History of Schizophrenia/Schizoaffective disorder:No  Duration of Psychotic Symptoms:No data recorded Hallucinations:No data recorded Ideas of Reference:None  Suicidal Thoughts:No data recorded Homicidal Thoughts:No data recorded  Sensorium  Memory: Immediate Good; Recent Good; Remote Good  Judgment: Poor  Insight: Poor   Executive Functions  Concentration: Fair  Attention Span: Fair  Recall: Good  Fund of Knowledge: Good  Language: Good   Psychomotor Activity  Psychomotor Activity:No data recorded  Assets  Assets: Communication Skills; Desire for Improvement; Physical Health; Resilience; Social Support   Sleep  Sleep:No data recorded  Physical Exam: Physical Exam ROS Blood pressure 128/82, pulse 64, temperature 97.9 F (36.6 C), temperature source Oral, resp. rate 18, SpO2 97 %. There is no height or weight on file to calculate BMI.  Mental Status Per Nursing Assessment::   On Admission:  Suicidal ideation indicated by patient  Demographic Factors:  Age 75 or older  and Caucasian  Loss Factors: NA  Historical Factors: NA  Risk Reduction Factors:   NA   Cognitive Features That Contribute To Risk:  None    Suicide Risk:  Minimal: No identifiable suicidal ideation.  Patients presenting with no risk factors but with morbid ruminations; may be classified as minimal risk based on the severity of the depressive symptoms   Follow-up Information     [facility] Follow up.   Why: Appointment is scheduled for 09/22/2022 at 3:00PM.  Appointments start out with therapy and once established they can refer you to see the medication providers. Contact information: [address] [place] [address] [phone]                 Plan Of Care/Follow-up recommendations: [facility] [place]sea Mall, DO 09/16/2022, 10:45 AM

## 2022-09-16 NOTE — Progress Notes (Signed)
D- Patient alert and oriented x 4. Patient pleasant and anxious about being discharged. Patient denies SI, HI, AVH, and pain.   A- Scheduled medications administered to patient, per MD orders. Support and encouragement provided.  Routine safety checks conducted every 15 minutes.  Patient informed to notify staff with problems or concerns.  R- No adverse drug reactions noted. Patient contracts for safety at this time. Patient compliant with medications and treatment plan. Patient receptive, calm, and cooperative. Patient interacts well with others on the unit.  Patient remains safe at this time.

## 2022-09-16 NOTE — Group Note (Signed)
[facility] LCSW Group Therapy Note    Group Date: 09/16/2022 Start Time: 1315 End Time: 1400  Type of Therapy and Topic:  Group Therapy:  Overcoming Obstacles  Participation Level:  [facility] PARTICIPATION LEVEL: Did Not Attend  Mood:  Description of Group:   In this group patients will be encouraged to explore what they see as obstacles to their own wellness and recovery. They will be guided to discuss their thoughts, feelings, and behaviors related to these obstacles. The group will process together ways to cope with barriers, with attention given to specific choices patients can make. Each patient will be challenged to identify changes they are motivated to make in order to overcome their obstacles. This group will be process-oriented, with patients participating in exploration of their own experiences as well as giving and receiving support and challenge from other group members.  Therapeutic Goals: 1. Patient will identify personal and current obstacles as they relate to admission. 2. Patient will identify barriers that currently interfere with their wellness or overcoming obstacles.  3. Patient will identify feelings, thought process and behaviors related to these barriers. 4. Patient will identify two changes they are willing to make to overcome these obstacles:    Summary of Patient Progress Patient did not attend.      Therapeutic Modalities:   Cognitive Behavioral Therapy Solution Focused Therapy Motivational Interviewing Relapse Prevention Therapy   [name], LCSW

## 2022-09-16 NOTE — Progress Notes (Signed)
Discharge Note:  Patient denies SI/HI/AVH at this time. Discharge instructions, AVS, prescriptions, and transition record gone over with patient. Patient agrees to comply with medication management, follow-up visit, and outpatient therapy. Patient belongings returned to patient and verified. Patient questions and concerns addressed and answered. Patient ambulatory off unit. Patient discharged to home daughter.

## 2022-09-16 NOTE — Plan of Care (Signed)
  Problem: Education: Goal: Knowledge of General Education information will improve Description: Including pain rating scale, medication(s)/side effects and non-pharmacologic comfort measures Outcome: Progressing   Problem: Health Behavior/Discharge Planning: Goal: Ability to manage health-related needs will improve Outcome: Progressing   Problem: Clinical Measurements: Goal: Ability to maintain clinical measurements within normal limits will improve Outcome: Progressing Goal: Respiratory complications will improve Outcome: Progressing Goal: Cardiovascular complication will be avoided Outcome: Progressing   Problem: Activity: Goal: Risk for activity intolerance will decrease Outcome: Progressing   Problem: Coping: Goal: Level of anxiety will decrease Outcome: Progressing   Problem: Nutrition: Goal: Adequate nutrition will be maintained Outcome: Progressing   Problem: Pain Managment: Goal: General experience of comfort will improve Outcome: Progressing

## 2022-09-16 NOTE — Progress Notes (Signed)
  [facility] Adult Case Management Discharge Plan :  Will you be returning to the same living situation after discharge:  Yes,  pt reports that she is returning home. At discharge, do you have transportation home?: Yes,  pt reports that family will provide transportation.  Do you have the ability to pay for your medications: Yes,  [employer] / [employer] PPO  Release of information consent forms completed and in the chart;  Patient's signature needed at discharge.  Patient to Follow up at:  Follow-up Information     [facility] Follow up.   Why: Appointment is scheduled for 09/22/2022 at 3:00PM.  Appointments start out with therapy and once established they can refer you to see the medication providers. Contact information: [address] [place] [address] [phone]                 Next level of care provider has access to [facility] Link:no  Safety Planning and Suicide Prevention discussed: Yes,  SPE completed with the patinet.      Has patient been referred to the Quitline?: N/A patient is not a smoker  Patient has been referred for addiction treatment: N/A  [name], LCSW 09/16/2022, 12:51 PM

## 2022-09-17 ENCOUNTER — Other Ambulatory Visit: Payer: Self-pay | Admitting: Family Medicine

## 2022-09-17 ENCOUNTER — Other Ambulatory Visit: Payer: Self-pay | Admitting: Orthopedic Surgery

## 2022-09-17 ENCOUNTER — Telehealth: Payer: Self-pay | Admitting: *Deleted

## 2022-09-17 NOTE — Transitions of Care (Post Inpatient/ED Visit) (Signed)
   09/17/2022  Name: LAREN ORAMA MRN: [id number] DOB: May 30, 1946  Today's TOC FU Call Status: Today's TOC FU Call Status:: Unsuccessul Call (1st Attempt) Unsuccessful Call (1st Attempt) Date: 09/17/22  Attempted to reach the patient regarding the most recent Inpatient/ED visit.  Follow Up Plan: Additional outreach attempts will be made to reach the patient to complete the Transitions of Care (Post Inpatient/ED visit) call.   Gean Maidens BSN RN [facility] [phone]

## 2022-09-18 ENCOUNTER — Telehealth: Payer: Self-pay | Admitting: *Deleted

## 2022-09-18 NOTE — Transitions of Care (Post Inpatient/ED Visit) (Signed)
   09/18/2022  Name: Dana Obrien MRN: [id number] DOB: 1945/07/07  Today's TOC FU Call Status: Today's TOC FU Call Status:: Unsuccessful Call (2nd Attempt) Unsuccessful Call (2nd Attempt) Date: 09/18/22  Attempted to reach the patient regarding the most recent Inpatient/ED visit.  Follow Up Plan: Additional outreach attempts will be made to reach the patient to complete the Transitions of Care (Post Inpatient/ED visit) call.   Gean Maidens BSN RN [facility] [phone]

## 2022-09-21 ENCOUNTER — Telehealth: Payer: Self-pay | Admitting: *Deleted

## 2022-09-21 NOTE — Transitions of Care (Post Inpatient/ED Visit) (Signed)
   09/21/2022  Name: Dana Obrien MRN: [id number] DOB: 11-20-1945  Today's TOC FU Call Status: Today's TOC FU Call Status:: Successful TOC FU Call Competed TOC FU Call Complete Date: 09/21/22  Transition Care Management Follow-up Telephone Call Date of Discharge: 09/16/22 Discharge Facility: [facility]) Type of Discharge: Inpatient Admission Primary Inpatient Discharge Diagnosis:: Depression How have you been since you were released from the hospital?: Better Any questions or concerns?: No  Items Reviewed: Did you receive and understand the discharge instructions provided?: Yes Medications obtained and verified?: Yes (Medications Reviewed) Any new allergies since your discharge?: No Dietary orders reviewed?: No Do you have support at home?: Yes People in Home: alone Name of Support/Comfort Primary Source: [facility] and Equipment/Supplies: Were Home Health Services Ordered?: No Any new equipment or medical supplies ordered?: No  Functional Questionnaire: Do you need assistance with bathing/showering or dressing?: No Do you need assistance with meal preparation?: No Do you need assistance with eating?: No Do you have difficulty maintaining continence: No Do you need assistance with getting out of bed/getting out of a chair/moving?: No Do you have difficulty managing or taking your medications?: No  Follow up appointments reviewed: PCP Follow-up appointment confirmed?: NA Follow-Up Specialty Provider:: church counseling Reason Specialist Follow-Up Not Confirmed: Patient has Specialist Provider Number and will Call for Appointment (per patient she is going to a church counselor) Do you need transportation to your follow-up appointment?: No Do you understand care options if your condition(s) worsen?: Yes-patient verbalized understanding  SDOH Interventions Today    Flowsheet Row Most Recent Value  SDOH Interventions   Food Insecurity  Interventions Intervention Not Indicated  Housing Interventions Intervention Not Indicated  Transportation Interventions Intervention Not Indicated      Interventions Today    Flowsheet Row Most Recent Value  General Interventions   General Interventions Discussed/Reviewed General Interventions Discussed, General Interventions Reviewed, Doctor Visits  Mental Health Interventions   Mental Health Discussed/Reviewed Mental Health Discussed, Mental Health Reviewed  [patien is going to a church counselor]         Gean Maidens BSN RN [facility] [phone]

## 2022-09-22 ENCOUNTER — Other Ambulatory Visit: Payer: Self-pay | Admitting: Family Medicine

## 2022-09-22 DIAGNOSIS — G43809 Other migraine, not intractable, without status migrainosus: Secondary | ICD-10-CM

## 2022-09-23 MED ORDER — TRAMADOL HCL 50 MG PO TABS: 50.0000 mg | ORAL_TABLET | Freq: Four times a day (QID) | ORAL | 0 refills | Status: DC | PRN

## 2022-09-23 MED ORDER — SUMATRIPTAN SUCCINATE 50 MG PO TABS
50.0000 mg | ORAL_TABLET | ORAL | 0 refills | Status: DC | PRN
Start: 2022-09-23 — End: 2022-10-27

## 2022-09-27 DIAGNOSIS — G4733 Obstructive sleep apnea (adult) (pediatric): Secondary | ICD-10-CM | POA: Diagnosis not present

## 2022-10-02 ENCOUNTER — Other Ambulatory Visit: Payer: Self-pay | Admitting: Family Medicine

## 2022-10-02 NOTE — Telephone Encounter (Signed)
Refilled 2 weeks ago

## 2022-10-02 NOTE — Telephone Encounter (Signed)
Refill sent to pharmacy.  Patient needs to be scheduled for follow-up sooner than the end of this month to discuss anxiety and depression.  Please contact her and get her scheduled.  Thanks.

## 2022-10-23 ENCOUNTER — Other Ambulatory Visit: Payer: Self-pay | Admitting: *Deleted

## 2022-10-23 ENCOUNTER — Ambulatory Visit: Payer: PPO | Admitting: Family Medicine

## 2022-10-23 ENCOUNTER — Other Ambulatory Visit: Payer: PPO

## 2022-10-23 DIAGNOSIS — E538 Deficiency of other specified B group vitamins: Secondary | ICD-10-CM

## 2022-10-27 ENCOUNTER — Other Ambulatory Visit (INDEPENDENT_AMBULATORY_CARE_PROVIDER_SITE_OTHER): Payer: PPO

## 2022-10-27 ENCOUNTER — Encounter: Payer: Self-pay | Admitting: Family Medicine

## 2022-10-27 ENCOUNTER — Ambulatory Visit (INDEPENDENT_AMBULATORY_CARE_PROVIDER_SITE_OTHER): Payer: PPO | Admitting: Family Medicine

## 2022-10-27 VITALS — BP 126/78 | HR 79 | Temp 98.5°F | Ht 64.0 in | Wt 198.6 lb

## 2022-10-27 DIAGNOSIS — G43809 Other migraine, not intractable, without status migrainosus: Secondary | ICD-10-CM

## 2022-10-27 DIAGNOSIS — F419 Anxiety disorder, unspecified: Secondary | ICD-10-CM

## 2022-10-27 DIAGNOSIS — I10 Essential (primary) hypertension: Secondary | ICD-10-CM | POA: Diagnosis not present

## 2022-10-27 DIAGNOSIS — E538 Deficiency of other specified B group vitamins: Secondary | ICD-10-CM

## 2022-10-27 DIAGNOSIS — F32A Depression, unspecified: Secondary | ICD-10-CM

## 2022-10-27 LAB — VITAMIN B12: Vitamin B-12: 462 pg/mL (ref 211–911)

## 2022-10-27 MED ORDER — SUMATRIPTAN SUCCINATE 50 MG PO TABS
50.0000 mg | ORAL_TABLET | ORAL | 0 refills | Status: DC | PRN
Start: 2022-10-27 — End: 2022-12-23

## 2022-10-27 MED ORDER — PANTOPRAZOLE SODIUM 40 MG PO TBEC: 40.0000 mg | DELAYED_RELEASE_TABLET | Freq: Every day | ORAL | 0 refills | Status: DC

## 2022-10-27 MED ORDER — VALSARTAN 160 MG PO TABS: 160.0000 mg | ORAL_TABLET | Freq: Every day | ORAL | 3 refills | Status: DC

## 2022-10-27 MED ORDER — ALPRAZOLAM 0.25 MG PO TABS: 0.2500 mg | ORAL_TABLET | Freq: Two times a day (BID) | ORAL | 0 refills | Status: DC | PRN

## 2022-10-27 NOTE — Patient Instructions (Signed)
Nice to see you. Please start the Wellbutrin once daily. We are going to start valsartan 160 mg daily for your blood pressure.  Please continue hydrochlorothiazide.  I will see you back in 10 days for your blood pressure.

## 2022-10-27 NOTE — Progress Notes (Signed)
Marikay Alar, MD Phone: [phone]  Dana Obrien is a 77 y.o. female who presents today for f/u.  HYPERTENSION Disease Monitoring Home BP Monitoring 115-182/66-113 Chest pain- no    Dyspnea- no Medications Compliance-  taking HCTZ. BMET    Component Value Date/Time   NA 141 09/08/2022 2057   NA 139 11/20/2013 1534   K 3.3 (L) 09/08/2022 2057   K 3.9 11/20/2013 1534   CL 103 09/08/2022 2057   CL 105 11/20/2013 1534   CO2 29 09/08/2022 2057   CO2 32 11/20/2013 1534   GLUCOSE 122 (H) 09/08/2022 2057   GLUCOSE 104 (H) 11/20/2013 1534   BUN 15 09/08/2022 2057   BUN 20 (H) 11/20/2013 1534   CREATININE 0.72 09/08/2022 2057   CREATININE 1.12 (H) 11/01/2015 1622   CALCIUM 8.9 09/08/2022 2057   CALCIUM 9.2 11/20/2013 1534   GFRNONAA >60 09/08/2022 2057   GFRNONAA >60 11/20/2013 1534   GFRAA >60 04/10/2019 0240   GFRAA >60 11/20/2013 1534   Anxiety/depression: Patient was hospitalized for depression with SI.  She was continued on Prozac 60 mg daily and they wanted to start Wellbutrin 150 mg daily though the patient has not started this since discharge.  She notes she was going through a lot with her home situation.  Since getting out of the hospital she notes her son is going to be moving out which will help with the stress at home.  She also notes she never really had a plan or intent to harm herself.  Notes no current SI.  Social History   Tobacco Use  Smoking Status Never  Smokeless Tobacco Never    Current Outpatient Medications on File Prior to Visit  Medication Sig Dispense Refill   albuterol (VENTOLIN HFA) 108 (90 Base) MCG/ACT inhaler Inhale 2 puffs into the lungs every 6 (six) hours as needed for wheezing or shortness of breath. 8 g 0   buPROPion (WELLBUTRIN XL) 150 MG 24 hr tablet Take 1 tablet (150 mg total) by mouth daily. 30 tablet 3   celecoxib (CELEBREX) 100 MG capsule Take 1 capsule by mouth twice daily 60 capsule 0   EQ ALLERGY RELIEF, CETIRIZINE, 10  MG tablet Take 1 tablet by mouth once daily 90 tablet 0   EQ ARTHRITIS PAIN RELIEVER 1 % GEL APPLY 2 GRAMS TOPICALLY FOUR TIMES DAILY AS NEEDED 50 g 0   FLUoxetine (PROZAC) 20 MG capsule Take 3 capsules (60 mg total) by mouth daily. 90 capsule 3   fluticasone (FLONASE) 50 MCG/ACT nasal spray Place 1 spray into both nostrils daily. 16 g 0   methocarbamol (ROBAXIN) 500 MG tablet TAKE 1 TABLET BY MOUTH EVERY 8 HOURS AS NEEDED FOR MUSCLE SPASM 30 tablet 0   Multiple Vitamin (MULTI-VITAMIN) tablet Take 1 tablet by mouth daily.      Omega-3 Fatty Acids (FISH OIL PO) Take 1 tablet by mouth daily.     ondansetron (ZOFRAN) 4 MG tablet Take 1 tablet (4 mg total) by mouth every 8 (eight) hours as needed for nausea or vomiting. 20 tablet 0   pindolol (VISKEN) 5 MG tablet Take 1 tablet (5 mg total) by mouth 2 (two) times daily. 60 tablet 3   rosuvastatin (CRESTOR) 40 MG tablet Take 1 tablet (40 mg total) by mouth daily. 90 tablet 3   senna (SENOKOT) 8.6 MG TABS tablet Take 1 tablet by mouth at bedtime.      traMADol (ULTRAM) 50 MG tablet Take 1 tablet (50 mg total)  by mouth every 6 (six) hours as needed. 30 tablet 0   traZODone (DESYREL) 50 MG tablet Take 1 tablet (50 mg total) by mouth at bedtime. 30 tablet 3   tretinoin (RETIN-A) 0.05 % cream Apply 1 application topically at bedtime. 45 g 0   No current facility-administered medications on file prior to visit.     ROS see history of present illness  Objective  Physical Exam Vitals:   10/27/22 1114 10/27/22 1131  BP: 130/84 126/78  Pulse: 79   Temp: 98.5 F (36.9 C)   SpO2: 96%     BP Readings from Last 3 Encounters:  10/27/22 126/78  09/16/22 128/82  09/09/22 (!) 144/93   Wt Readings from Last 3 Encounters:  10/27/22 198 lb 9.6 oz (90.1 kg)  09/08/22 190 lb (86.2 kg)  07/21/22 193 lb (87.5 kg)    Physical Exam Constitutional:      General: She is not in acute distress.    Appearance: She is not diaphoretic.  Cardiovascular:      Rate and Rhythm: Normal rate and regular rhythm.     Heart sounds: Normal heart sounds.  Pulmonary:     Effort: Pulmonary effort is normal.     Breath sounds: Normal breath sounds.  Skin:    General: Skin is warm and dry.  Neurological:     Mental Status: She is alert.      Assessment/Plan: Please see individual problem list.  Primary hypertension Assessment & Plan: Chronic issue.  Poorly controlled.  We will continue HCTZ 25 mg daily.  Will start valsartan 160 mg daily.  Check labs and follow-up with me in 10 days for her blood pressure.   Other migraine without status migrainosus, not intractable -     SUMAtriptan Succinate; Take 1 tablet (50 mg total) by mouth every 2 (two) hours as needed for migraine. May repeat in 2 hours if headache persists or recurs. No more than 2 doses in a 24 hour time frame.  Dispense: 10 tablet; Refill: 0  Anxiety and depression Assessment & Plan: Chronic issue.  Patient reports improvement.  She will continue Prozac 60 mg daily.  She will start Wellbutrin 150 mg daily.  She confirms no history of seizures.  Discussed it may take 6 to 8 weeks for her to notice a big benefit with starting the Wellbutrin.   Other orders -     Pantoprazole Sodium; Take 1 tablet (40 mg total) by mouth daily.  Dispense: 90 tablet; Refill: 0 -     ALPRAZolam; Take 1 tablet (0.25 mg total) by mouth 2 (two) times daily as needed. for anxiety  Dispense: 20 tablet; Refill: 0 -     Valsartan; Take 1 tablet (160 mg total) by mouth daily.  Dispense: 90 tablet; Refill: 3     Return in about 10 days (around 11/06/2022) for Blood pressure follow-up with PCP, 79-month follow-up for depression.   Marikay Alar, MD [facility]

## 2022-10-27 NOTE — Assessment & Plan Note (Signed)
Chronic issue.  Poorly controlled.  We will continue HCTZ 25 mg daily.  Will start valsartan 160 mg daily.  Check labs and follow-up with me in 10 days for her blood pressure.

## 2022-10-27 NOTE — Assessment & Plan Note (Signed)
Chronic issue.  Patient reports improvement.  She will continue Prozac 60 mg daily.  She will start Wellbutrin 150 mg daily.  She confirms no history of seizures.  Discussed it may take 6 to 8 weeks for her to notice a big benefit with starting the Wellbutrin.

## 2022-10-28 ENCOUNTER — Encounter: Payer: Self-pay | Admitting: Orthopedic Surgery

## 2022-10-28 ENCOUNTER — Other Ambulatory Visit (INDEPENDENT_AMBULATORY_CARE_PROVIDER_SITE_OTHER): Payer: PPO

## 2022-10-28 ENCOUNTER — Ambulatory Visit: Payer: PPO | Admitting: Orthopedic Surgery

## 2022-10-28 VITALS — BP 125/82 | HR 73 | Ht 64.0 in | Wt 193.0 lb

## 2022-10-28 DIAGNOSIS — M5416 Radiculopathy, lumbar region: Secondary | ICD-10-CM

## 2022-10-28 NOTE — Progress Notes (Signed)
Orthopedic Spine Surgery Office Note  Assessment: Patient is a 77 y.o. female with low back pain that radiates into her bilateral groin.  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 Discussed that the conservative treatments include:  -activity modification  -physical therapy  -over the counter pain medications  -medrol dosepak  -lumbar steroid injections -Since she has been getting relief with NSAIDs and periodic steroid injections, recommended continued nonoperative treatment.  Told her that if her pain gets severe, we can try a repeat injection since she did get good relief with the first one -Patient should return to office on an as-needed basis   Patient expressed understanding of the plan and all questions were answered to the patient's satisfaction.   ___________________________________________________________________________   History:  Patient is a 77 y.o. female who presents today for lumbar spine.  Patient has had 2 years of low back pain that radiates into her bilateral groin.  Pain has been getting progressively worse with time. She notes it is more significant and more consistent on the left side.  She notices pain is worse with activity.  She gives the example of common household activities like vacuuming that make the pain worse.  She states that her pain is relatively well-controlled with NSAIDs.  She did get an injection to the L2-3 level that gave her about 70% relief.  She does not have any pain radiating past the groin on either side.  Denies paresthesias and numbness.  There is no trauma or injury that preceded the onset of pain.   Weakness: Denies Symptoms of imbalance: Denies Paresthesias and numbness: Denies Bowel or bladder incontinence: Yes, has had 1  year of urinary incontinence.  Says she will leak urine at times but as long as she regularly goes to the restroom, she does not have this issue.No bowel incontinence. Saddle anesthesia: Denies  Treatments tried: Tylenol, ibuprofen, L2/3 steroid injection  Review of systems: Denies fevers and chills, night sweats, unexplained weight loss, pain that wakes them at night.  Has history of breast cancer  Past medical history: Hypertension OSA Migraines Depression/anxiety Chronic pain Breast cancer GERD  Allergies: Lidocaine, codeine, morphine, niacin  Past surgical history:  D&C of uterus Hysterectomy Breast lumpectomy Laparotomy Maxillary antrostomy Sinus exploration Sphenoidectomy  Social history: Denies use of nicotine product (smoking, vaping, patches, smokeless) Alcohol use: Denies Denies recreational drug use   Physical Exam:  BMI of 33.1  General: no acute distress, appears stated age Neurologic: alert, answering questions appropriately, following commands Respiratory: unlabored breathing on room air, symmetric chest rise Psychiatric: appropriate affect, normal cadence to speech   MSK (spine):  -Strength exam      Left  Right EHL    5/5  5/5 TA    5/5  5/5 GSC    5/5  5/5 Knee extension  5/5  5/5 Hip flexion   5/5  5/5  -Sensory exam    Sensation intact to light touch in L3-S1 nerve distributions of bilateral lower extremities  -Achilles DTR: 1/4 on the left, 1/4 on the right -Patellar tendon DTR: 1/4 on the left, 1/4 on the right  -Straight leg raise: Negative bilaterally -Femoral nerve stretch test: Negative bilaterally -Clonus: no beats bilaterally -Negative Romberg  -Left hip exam: No pain through range of motion -Right hip exam: No pain  through range of motion  Imaging: XR of the lumbar spine from 07/09/2022 and 10/28/2022 was independently reviewed and interpreted, showing disc height loss at L2/3, L3/4, L5/S1.  Stable grade 1  spondylolisthesis seen at L4/5.  No fracture or dislocation seen.  Has a thoracolumbar scoliosis with apex at L1 to the right.  MRI of the lumbar spine from 07/13/2022 was independently reviewed and interpreted, showing central and lateral recess stenosis at L2/3.  Foraminal stenosis on the left at L2/3.  Lateral recess stenosis at L4/5 more so on the right.   Patient name: Dana Obrien Patient MRN: [id number] Date of visit: 10/28/22

## 2022-10-30 ENCOUNTER — Ambulatory Visit: Payer: PPO | Admitting: Surgical

## 2022-10-30 DIAGNOSIS — M1711 Unilateral primary osteoarthritis, right knee: Secondary | ICD-10-CM

## 2022-10-30 DIAGNOSIS — M1712 Unilateral primary osteoarthritis, left knee: Secondary | ICD-10-CM | POA: Diagnosis not present

## 2022-10-31 ENCOUNTER — Encounter: Payer: Self-pay | Admitting: Surgical

## 2022-10-31 MED ORDER — BUPIVACAINE HCL 0.25 % IJ SOLN
4.0000 mL | INTRAMUSCULAR | Status: AC | PRN
Start: 2022-10-30 — End: 2022-10-30
  Administered 2022-10-30: 4 mL via INTRA_ARTICULAR

## 2022-10-31 MED ORDER — METHYLPREDNISOLONE ACETATE 40 MG/ML IJ SUSP
40.0000 mg | INTRAMUSCULAR | Status: AC | PRN
Start: 2022-10-30 — End: 2022-10-30
  Administered 2022-10-30: 40 mg via INTRA_ARTICULAR

## 2022-10-31 MED ORDER — LIDOCAINE HCL 1 % IJ SOLN
5.0000 mL | INTRAMUSCULAR | Status: AC | PRN
Start: 2022-10-30 — End: 2022-10-30
  Administered 2022-10-30: 5 mL

## 2022-10-31 NOTE — Progress Notes (Signed)
Office Visit Note   Patient: Dana Obrien           Date of Birth: [birth year]           MRN: [id number] Visit Date: 10/30/2022 Requested by: Glori Luis, MD [address] [place],  [place] [address] PCP: Glori Luis, MD  Subjective: Chief Complaint  Patient presents with   Left Knee - Pain   Right Knee - Pain    HPI: Dana Obrien is a 77 y.o. female who presents to the office reporting bilateral knee pain.  Patient has history of bilateral knee osteoarthritis.  She denies any history of recent injury to either knee but states that over the last several months, she has developed recurrent knee pain.  She has stiffness with immobility and subjective instability of the knees but they have not caused her to fall.  No groin pain consistently these days.  No fevers or chills..                ROS: All systems reviewed are negative as they relate to the chief complaint within the history of present illness.  Patient denies fevers or chills.  Assessment & Plan: Visit Diagnoses:  1. Unilateral primary osteoarthritis, right knee   2. Unilateral primary osteoarthritis, left knee     Plan: Patient is a 77 year old female who presents for evaluation of bilateral knee pain.  She has history of bilateral knee osteoarthritis.  She would like to repeat cortisone injections that she has had in the past.  Bilateral knee cortisone injections were successfully administered and patient tolerated procedure well without complication.  Follow-up with the office as needed.  Follow-Up Instructions: No follow-ups on file.   Orders:  No orders of the defined types were placed in this encounter.  No orders of the defined types were placed in this encounter.     Procedures: Large Joint Inj: bilateral knee on 10/30/2022 4:00 PM Indications: diagnostic evaluation, joint swelling and pain Details: 18 G 1.5 in needle, superolateral approach  Arthrogram: No  Medications  (Right): 5 mL lidocaine 1 %; 4 mL bupivacaine 0.25 %; 40 mg methylPREDNISolone acetate 40 MG/ML Medications (Left): 5 mL lidocaine 1 %; 4 mL bupivacaine 0.25 %; 40 mg methylPREDNISolone acetate 40 MG/ML Outcome: tolerated well, no immediate complications Procedure, treatment alternatives, risks and benefits explained, specific risks discussed. Consent was given by the patient. Immediately prior to procedure a time out was called to verify the correct patient, procedure, equipment, support staff and site/side marked as required. Patient was prepped and draped in the usual sterile fashion.       Clinical Data: No additional findings.  Objective: Vital Signs: There were no vitals taken for this visit.  Physical Exam:  Constitutional: Patient appears well-developed HEENT:  Head: Normocephalic Eyes:EOM are normal Neck: Normal range of motion Cardiovascular: Normal rate Pulmonary/chest: Effort normal Neurologic: Patient is alert Skin: Skin is warm Psychiatric: Patient has normal mood and affect  Ortho Exam: Ortho exam demonstrates left and right knees with 0 degrees extension and greater than 90 degrees of knee flexion.  No cellulitis or skin changes noted in either knee.  No calf tenderness.  Negative Homans' sign.  No pain with hip range of motion bilaterally.  She has no effusion in either knee.  She has tenderness over the medial joint line moderately and lateral joint line mildly in both knees.  Able to perform straight leg raise without extensor lag in both knees.  Specialty Comments:  No specialty comments available.  Imaging: No results found.   PMFS History: Patient Active Problem List   Diagnosis Date Noted   MDD (major depressive disorder), recurrent episode, moderate (HCC) 09/09/2022   Major depressive disorder, single episode, severe without psychosis (HCC) 09/09/2022   Hydronephrosis 07/21/2022   Left lower quadrant pain 04/20/2022   Pustule 12/09/2021   Tick bite  of right upper arm 12/09/2021   Urge incontinence 07/11/2021   Right knee pain 11/08/2020   Concussion with no loss of consciousness 08/08/2020   Vitamin D deficiency 08/08/2020   Gastroesophageal reflux disease without esophagitis 03/25/2020   Dermatitis due to plants, including poison ivy, sumac, and oak 02/29/2020   Neuropathy 12/15/2019   Allergic rhinitis 07/05/2019   Brain atrophy (HCC) 05/18/2019   Bilateral hearing loss 08/15/2018   Decreased energy 08/15/2018   Prediabetes 06/02/2018   Arthralgia 06/02/2018   BPPV (benign paroxysmal positional vertigo), right 03/11/2018   Dysfunction of right eustachian tube 03/11/2018   Malignant neoplasm of upper-outer quadrant of left breast in female, 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/14/2017   Fatty liver 08/24/2017   Hearing difficulty of both ears 06/25/2017   Bilateral sensorineural hearing loss 10/13/2016   Right ear impacted cerumen 10/13/2016   GERD (gastroesophageal reflux disease) 09/07/2016   Uterine prolapse 11/14/2015   OSA (obstructive sleep apnea) 11/01/2015   Back muscle spasm 04/30/2015   DDD (degenerative disc disease), lumbar 04/30/2015   Lumbar radiculitis 04/30/2015   Low back pain 01/01/2015   Varicose vein of leg 11/22/2014   Increased frequency of urination 11/17/2013   Rectocele 11/17/2013   Cystocele 10/25/2013   Skin lesion 10/25/2013   Disorder of skin and subcutaneous tissue 10/25/2013   Midline cystocele 10/25/2013   Obesity, unspecified 10/19/2012   History of breast cancer 02/11/2012   Hyperlipidemia 05/11/2011   Osteopenia 05/11/2011   Disorder of bone and cartilage 05/11/2011   Anxiety and depression 03/04/2011   Hypertension 03/04/2011   Past Medical History:  Diagnosis Date   Anxiety    Arthritis    Asthma    Breast cancer (HCC) 2004   right breast   Breast cancer (HCC) 09/08/2017   left breast   Breast 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   Cancer [facility]) 2004   right lumpectomy, chemotherapy and radiation Dr. Koleen Nimrod   CPAP (continuous positive airway pressure) dependence    Depression    GERD (gastroesophageal reflux disease)    Hypertension    Migraine    Personal history of chemotherapy 2004   Personal history of radiation therapy 2004   Personal history of radiation therapy 2019   PONV (postoperative nausea and vomiting)    Shingles    Sleep apnea    uses CPAP, severe OSA   Vitamin D deficiency    IN THE PAST    Family History  Problem Relation Age of Onset   Heart disease Mother    Hyperlipidemia Mother    Hypertension Mother    Hypertension Father    Osteoarthritis Father    Stroke Father    Alzheimer's disease Sister    Alzheimer's disease Brother    Brain cancer Brother    Healthy Daughter    Healthy Son    Breast cancer Neg Hx     Past Surgical History:  Procedure Laterality Date   ABDOMINAL HYSTERECTOMY     ANTERIOR AND POSTERIOR  REPAIR     BREAST BIOPSY Right 2004   stereo. 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  3.5 cm maximum diameter, minimal margins 5 mm.  In situ component less than 10%.   BREAST LUMPECTOMY Right 2004   BREAST LUMPECTOMY Left 09/17/2017   Procedure: BREAST EXCISION, SENTINEL NODE BIOPSY;  Surgeon: Earline Mayotte, MD;  Location: [facility] ORS;  Service: General;  Laterality: Left;   COLONOSCOPY  2015   COLONOSCOPY WITH PROPOFOL N/A 10/04/2019   Procedure: COLONOSCOPY WITH PROPOFOL;  Surgeon: Earline Mayotte, MD;  Location: [facility] ENDOSCOPY;  Service: Endoscopy;  Laterality: N/A;   DILATION AND CURETTAGE OF UTERUS     ETHMOIDECTOMY Bilateral 11/05/2016   Procedure: ETHMOIDECTOMY;  Surgeon: Christia Reading, MD;  Location: [facility] OR;  Service: ENT;   Laterality: Bilateral;   LAPAROTOMY     MAXILLARY ANTROSTOMY Bilateral 11/05/2016   Procedure: MAXILLARY ANTROSTOMY;  Surgeon: Christia Reading, MD;  [facility];  Service: ENT;  Laterality: Bilateral;   SINUS ENDO W/FUSION Bilateral 11/05/2016   Procedure: FRONTAL RECESS EXPLORATION;  Surgeon: Christia Reading, MD;  Location: [facility] OR;  Service: ENT;  Laterality: Bilateral;  BILATERAL ENDOSCOPIC SINUS SURGERY WITH FUSION   SINUS EXPLORATION  11/05/2016   SPHENOIDECTOMY Bilateral 11/05/2016   Procedure: SPHENOIDECTOMY;  Surgeon: Christia Reading, MD;  Location: [facility] OR;  Service: ENT;  Laterality: Bilateral;   Social History   Occupational History   Not on file  Tobacco Use   Smoking status: Never   Smokeless tobacco: Never  Vaping Use   Vaping Use: Never used  Substance and Sexual Activity   Alcohol use: Yes    Comment: rarely   Drug use: No   Sexual activity: Not Currently

## 2022-11-02 ENCOUNTER — Encounter: Payer: Self-pay | Admitting: Oncology

## 2022-11-02 ENCOUNTER — Inpatient Hospital Stay: Payer: PPO | Attending: Oncology | Admitting: Oncology

## 2022-11-02 VITALS — BP 134/84 | HR 68 | Temp 97.4°F | Resp 18 | Ht 64.0 in | Wt 199.8 lb

## 2022-11-02 DIAGNOSIS — C50412 Malignant neoplasm of upper-outer quadrant of left female breast: Secondary | ICD-10-CM | POA: Insufficient documentation

## 2022-11-02 DIAGNOSIS — Z818 Family history of other mental and behavioral disorders: Secondary | ICD-10-CM | POA: Diagnosis not present

## 2022-11-02 DIAGNOSIS — Z8261 Family history of arthritis: Secondary | ICD-10-CM | POA: Diagnosis not present

## 2022-11-02 DIAGNOSIS — Z8249 Family history of ischemic heart disease and other diseases of the circulatory system: Secondary | ICD-10-CM | POA: Insufficient documentation

## 2022-11-02 DIAGNOSIS — I10 Essential (primary) hypertension: Secondary | ICD-10-CM | POA: Insufficient documentation

## 2022-11-02 DIAGNOSIS — Z923 Personal history of irradiation: Secondary | ICD-10-CM | POA: Diagnosis not present

## 2022-11-02 DIAGNOSIS — Z79899 Other long term (current) drug therapy: Secondary | ICD-10-CM | POA: Diagnosis not present

## 2022-11-02 DIAGNOSIS — K219 Gastro-esophageal reflux disease without esophagitis: Secondary | ICD-10-CM | POA: Insufficient documentation

## 2022-11-02 DIAGNOSIS — Z9221 Personal history of antineoplastic chemotherapy: Secondary | ICD-10-CM | POA: Diagnosis not present

## 2022-11-02 DIAGNOSIS — F419 Anxiety disorder, unspecified: Secondary | ICD-10-CM | POA: Insufficient documentation

## 2022-11-02 DIAGNOSIS — Z08 Encounter for follow-up examination after completed treatment for malignant neoplasm: Secondary | ICD-10-CM

## 2022-11-02 DIAGNOSIS — Z853 Personal history of malignant neoplasm of breast: Secondary | ICD-10-CM

## 2022-11-02 DIAGNOSIS — F32A Depression, unspecified: Secondary | ICD-10-CM | POA: Insufficient documentation

## 2022-11-02 DIAGNOSIS — Z808 Family history of malignant neoplasm of other organs or systems: Secondary | ICD-10-CM | POA: Insufficient documentation

## 2022-11-02 DIAGNOSIS — Z8349 Family history of other endocrine, nutritional and metabolic diseases: Secondary | ICD-10-CM | POA: Diagnosis not present

## 2022-11-02 DIAGNOSIS — G4733 Obstructive sleep apnea (adult) (pediatric): Secondary | ICD-10-CM | POA: Insufficient documentation

## 2022-11-02 DIAGNOSIS — Z170 Estrogen receptor positive status [ER+]: Secondary | ICD-10-CM | POA: Diagnosis not present

## 2022-11-02 DIAGNOSIS — Z823 Family history of stroke: Secondary | ICD-10-CM | POA: Diagnosis not present

## 2022-11-02 DIAGNOSIS — Z885 Allergy status to narcotic agent status: Secondary | ICD-10-CM | POA: Insufficient documentation

## 2022-11-02 DIAGNOSIS — Z90710 Acquired absence of both cervix and uterus: Secondary | ICD-10-CM | POA: Insufficient documentation

## 2022-11-02 NOTE — Progress Notes (Signed)
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    Chief complaint/ Reason for visit-routine follow-up of breast cancer  Heme/Onc history: patient is a 77 year old female with a prior history of right breast cancer in 2004 s/p lumpectomy followed by [facility] X4 and XRT. T2N0M0.  She thenn completed 5 years of aromasin until 2009. She could not tolerate arimidex.   Mammogram on 08/25/2017 showed a possible mass in her left breast.  This was followed by an ultrasound and a diagnostic left mammogram which showed a 0.7 x 0.5 x 0.7 cm mass in the left breast.  No evidence of left axillary adenopathy.   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  ER greater than 90% positive, PR greater than 90% positive and HER-2/neu negative    Final pathology showed: Invasive mammary carcinoma 13 mm, grade 1 with associated DCIS.  Negative margins.  0 out of 3 lymph nodes were positive for malignancy. 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  Aromasin started in August 2019.  She had problems tolerating Aromasin and was switched to letrozole.  Patient stopped all  hormone therapy in February 2022 due to poor tolerance  Interval history-patient has symptoms of bulging disc and is getting injections for the same.  Denies any other complaints at this time.  ECOG PS- 1 Pain scale- 0   Review of systems- Review of Systems  Musculoskeletal:  Positive for back pain.      Allergies  Allergen Reactions   Lidocaine Palpitations   Codeine Nausea Only and Nausea And Vomiting   Morphine Nausea Only   Niacin Rash   Niacin And Related Rash    ANTIHYPERLIPEMICS     Past Medical History:  Diagnosis Date   Anxiety    Arthritis    Asthma    Breast cancer (HCC) 2004   right breast   Breast cancer (HCC) 09/08/2017   left breast   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   Cancer [facility]) 2004   right lumpectomy, chemotherapy and radiation Dr. Koleen Nimrod   CPAP (continuous positive airway pressure) dependence    Depression    GERD (gastroesophageal reflux disease)    Hypertension    Migraine    Personal history of chemotherapy 2004   Personal history of radiation therapy 2004   Personal history of radiation therapy 2019   PONV (postoperative nausea and vomiting)    Shingles    Sleep apnea    uses CPAP, severe OSA   Vitamin D deficiency    IN THE PAST     Past Surgical History:  Procedure Laterality  Date   ABDOMINAL HYSTERECTOMY     ANTERIOR AND POSTERIOR REPAIR     BREAST BIOPSY Right 2004   stereo. 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  3.5 cm maximum diameter, minimal margins 5 mm.  In situ component less than 10%.   BREAST LUMPECTOMY Right 2004   BREAST LUMPECTOMY Left 09/17/2017   Procedure: BREAST EXCISION, SENTINEL NODE BIOPSY;  Surgeon: Earline Mayotte,  MD;  Location: [facility] ORS;  Service: General;  Laterality: Left;   COLONOSCOPY  2015   COLONOSCOPY WITH PROPOFOL N/A 10/04/2019   Procedure: COLONOSCOPY WITH PROPOFOL;  Surgeon: Earline Mayotte, MD;  Location: [facility] ENDOSCOPY;  Service: Endoscopy;  Laterality: N/A;   DILATION AND CURETTAGE OF UTERUS     ETHMOIDECTOMY Bilateral 11/05/2016   Procedure: ETHMOIDECTOMY;  Surgeon: Christia Reading, MD;  Location: [facility] OR;  Service: ENT;  Laterality: Bilateral;   LAPAROTOMY     MAXILLARY ANTROSTOMY Bilateral 11/05/2016   Procedure: MAXILLARY ANTROSTOMY;  Surgeon: Christia Reading, MD;  Location: [facility] OR;  Service: ENT;  Laterality: Bilateral;   SINUS ENDO W/FUSION Bilateral 11/05/2016   Procedure: FRONTAL RECESS EXPLORATION;  Surgeon: Christia Reading, MD;  Location: [facility] OR;  Service: ENT;  Laterality: Bilateral;  BILATERAL ENDOSCOPIC SINUS SURGERY WITH FUSION   SINUS EXPLORATION  11/05/2016   SPHENOIDECTOMY Bilateral 11/05/2016   Procedure: SPHENOIDECTOMY;  Surgeon: Christia Reading, MD;  Location: [facility] OR;  Service: ENT;  Laterality: Bilateral;    Social History   Socioeconomic History   Marital status: Widowed    Spouse name: Not on file   Number of children: Not on file   Years of education: Not on file   Highest education level: Not on file  Occupational History   Not on file  Tobacco Use   Smoking status: Never   Smokeless tobacco: Never  Vaping Use   Vaping Use: Never used  Substance and Sexual Activity   Alcohol use: Yes    Comment: rarely   Drug use: No   Sexual activity: Not Currently  Other Topics Concern   Not on file  Social History Narrative   Daily Caffeine Use:  1-2 coffee   Regular Exercise -  NO   Social Determinants of Health   Financial Resource Strain: Low Risk  (03/12/2022)   Overall Financial Resource Strain (CARDIA)    Difficulty of Paying Living Expenses: Not hard at all  Food Insecurity: No Food Insecurity (09/21/2022)   Hunger Vital Sign    Worried About Running Out of Food in  the Last Year: Never true    Ran Out of Food in the Last Year: Never true  Transportation Needs: No Transportation Needs (09/21/2022)   PRAPARE - Administrator, Civil Service (Medical): No    Lack of Transportation (Non-Medical): No  Physical Activity: Insufficiently Active (03/12/2022)   Exercise Vital Sign    Days of Exercise per Week: 2 days    Minutes of Exercise per Session: 20 min  Stress: No Stress Concern Present (03/12/2022)   [employer] of Occupational Health - Occupational Stress Questionnaire    Feeling of Stress : Not at all  Social Connections: Unknown (03/12/2022)   Social Connection and Isolation Panel [NHANES]    Frequency of Communication with Friends and Family: More than three times a week    Frequency of  Social Gatherings with Friends and Family: More than three times a week    Attends Religious Services: More than 4 times per year    Active Member of [employer] or Organizations: Not on file    Attends Banker Meetings: Not on file    Marital Status: Widowed  Intimate Partner Violence: Not At Risk (09/09/2022)   Humiliation, Afraid, Rape, and Kick questionnaire    Fear of Current or Ex-Partner: No    Emotionally Abused: No    Physically Abused: No    Sexually Abused: No    Family History  Problem Relation Age of Onset   Heart disease Mother    Hyperlipidemia Mother    Hypertension Mother    Hypertension Father    Osteoarthritis Father    Stroke Father    Alzheimer's disease Sister    Alzheimer's disease Brother    Brain cancer Brother    Healthy Daughter    Healthy Son    Breast cancer Neg Hx      Current Outpatient Medications:    albuterol (VENTOLIN HFA) 108 (90 Base) MCG/ACT inhaler, Inhale 2 puffs into the lungs every 6 (six) hours as needed for wheezing or shortness of breath., Disp: 8 g, Rfl: 0   ALPRAZolam (XANAX) 0.25 MG tablet, Take 1 tablet (0.25 mg total) by mouth 2 (two) times daily as needed. for anxiety, Disp:  20 tablet, Rfl: 0   buPROPion (WELLBUTRIN XL) 150 MG 24 hr tablet, Take 1 tablet (150 mg total) by mouth daily., Disp: 30 tablet, Rfl: 3   celecoxib (CELEBREX) 100 MG capsule, Take 1 capsule by mouth twice daily, Disp: 60 capsule, Rfl: 0   EQ ALLERGY RELIEF, CETIRIZINE, 10 MG tablet, Take 1 tablet by mouth once daily, Disp: 90 tablet, Rfl: 0   EQ ARTHRITIS PAIN RELIEVER 1 % GEL, APPLY 2 GRAMS TOPICALLY FOUR TIMES DAILY AS NEEDED, Disp: 50 g, Rfl: 0   FLUoxetine (PROZAC) 20 MG capsule, Take 3 capsules (60 mg total) by mouth daily., Disp: 90 capsule, Rfl: 3   fluticasone (FLONASE) 50 MCG/ACT nasal spray, Place 1 spray into both nostrils daily., Disp: 16 g, Rfl: 0   methocarbamol (ROBAXIN) 500 MG tablet, TAKE 1 TABLET BY MOUTH EVERY 8 HOURS AS NEEDED FOR MUSCLE SPASM, Disp: 30 tablet, Rfl: 0   Multiple Vitamin (MULTI-VITAMIN) tablet, Take 1 tablet by mouth daily. , Disp: , Rfl:    Omega-3 Fatty Acids (FISH OIL PO), Take 1 tablet by mouth daily., Disp: , Rfl:    ondansetron (ZOFRAN) 4 MG tablet, Take 1 tablet (4 mg total) by mouth every 8 (eight) hours as needed for nausea or vomiting., Disp: 20 tablet, Rfl: 0   pantoprazole (PROTONIX) 40 MG tablet, Take 1 tablet (40 mg total) by mouth daily., Disp: 90 tablet, Rfl: 0   rosuvastatin (CRESTOR) 40 MG tablet, Take 1 tablet (40 mg total) by mouth daily., Disp: 90 tablet, Rfl: 3   senna (SENOKOT) 8.6 MG TABS tablet, Take 1 tablet by mouth at bedtime. , Disp: , Rfl:    SUMAtriptan (IMITREX) 50 MG tablet, Take 1 tablet (50 mg total) by mouth every 2 (two) hours as needed for migraine. May repeat in 2 hours if headache persists or recurs. No more than 2 doses in a 24 hour time frame., Disp: 10 tablet, Rfl: 0   traMADol (ULTRAM) 50 MG tablet, Take 1 tablet (50 mg total) by mouth every 6 (six) hours as needed., Disp: 30 tablet, Rfl: 0  tretinoin (RETIN-A) 0.05 % cream, Apply 1 application topically at bedtime., Disp: 45 g, Rfl: 0   valsartan (DIOVAN) 160 MG  tablet, Take 1 tablet (160 mg total) by mouth daily., Disp: 90 tablet, Rfl: 3  Physical exam:  Vitals:   11/02/22 0935  BP: 134/84  Pulse: 68  Resp: 18  Temp: (!) 97.4 F (36.3 C)  TempSrc: Tympanic  SpO2: 98%  Weight: 199 lb 12.8 oz (90.6 kg)  Height: 5\' 4"  (1.626 m)   Physical Exam Cardiovascular:     Rate and Rhythm: Normal rate and regular rhythm.     Heart sounds: Normal heart sounds.  Pulmonary:     Effort: Pulmonary effort is normal.     Breath sounds: Normal breath sounds.  Abdominal:     General: Bowel sounds are normal.     Palpations: Abdomen is soft.  Skin:    General: Skin is warm and dry.  Neurological:     Mental Status: She is alert and oriented to person, place, and time.    Breast exam was performed in seated and lying down position. Patient is status post left lumpectomy with a well-healed surgical scar. No evidence of any palpable masses. No evidence of axillary adenopathy. No evidence of any palpable masses or lumps in the right breast. No evidence of right axillary adenopathy      Latest Ref Rng & Units 09/08/2022    8:57 PM  CMP  Glucose 70 - 99 mg/dL 161   BUN 8 - 23 mg/dL 15   Creatinine 0.96 - 1.00 mg/dL 0.45   Sodium 409 - 811 mmol/L 141   Potassium 3.5 - 5.1 mmol/L 3.3   Chloride 98 - 111 mmol/L 103   CO2 22 - 32 mmol/L 29   Calcium 8.9 - 10.3 mg/dL 8.9   Total Protein 6.5 - 8.1 g/dL 7.0   Total Bilirubin 0.3 - 1.2 mg/dL 0.6   Alkaline Phos 38 - 126 U/L 65   AST 15 - 41 U/L 21   ALT 0 - 44 U/L 14       Latest Ref Rng & Units 09/08/2022    8:57 PM  CBC  WBC 4.0 - 10.5 K/uL 7.2   Hemoglobin 12.0 - 15.0 g/dL 91.4   Hematocrit 78.2 - 46.0 % 36.9   Platelets 150 - 400 K/uL 213     No images are attached to the encounter.  XR Lumb Spine Flex&Ext Only  Result Date: 10/28/2022 XR of the lumbar spine from 10/28/2022 was independently reviewed and interpreted, showing disc height loss at L2/3, L3/4, L5/S1.  Stable grade 1  spondylolisthesis seen at L4/5.  No fracture or dislocation seen. No evidence of instability on flexion/extension views.     Assessment and plan- Patient is a 77 y.o. female with invasive mammary carcinoma of the left breast stage IA pT1c pN0 cM0 ER PR positive and HER-2/neu negative status post lumpectomy and radiation treatment.  She is here for routine follow-up visit  Patient is now 5 years since the diagnosis of her left breast cancer in 2019.  She is status postlumpectomy and adjuvant radiation therapy.  She took endocrine therapy for about 3 years and then stopped it due to poor tolerance.  Patient will continue to follow-up with her primary care doctor at this time for yearly breast exams.  I will schedule her mammogram for this year and subsequent mammograms will be coordinated by Dr. Birdie Sons.   Visit Diagnosis 1. History of breast cancer  2. Encounter for follow-up surveillance of breast cancer   3. Malignant neoplasm of upper-outer quadrant of left breast in female, estrogen receptor positive (HCC)      Dr. Owens Shark, MD, MPH [facility] [phone] 11/02/2022 1:27 PM

## 2022-11-04 ENCOUNTER — Encounter: Payer: Self-pay | Admitting: Family Medicine

## 2022-11-04 ENCOUNTER — Ambulatory Visit (INDEPENDENT_AMBULATORY_CARE_PROVIDER_SITE_OTHER): Payer: PPO | Admitting: Family Medicine

## 2022-11-04 VITALS — BP 124/82 | HR 77 | Temp 98.1°F | Ht 64.0 in | Wt 199.0 lb

## 2022-11-04 DIAGNOSIS — I10 Essential (primary) hypertension: Secondary | ICD-10-CM

## 2022-11-04 DIAGNOSIS — R002 Palpitations: Secondary | ICD-10-CM | POA: Diagnosis not present

## 2022-11-04 LAB — CBC
HCT: 37.6 % (ref 36.0–46.0)
Hemoglobin: 12.4 g/dL (ref 12.0–15.0)
MCHC: 33 g/dL (ref 30.0–36.0)
MCV: 95 fl (ref 78.0–100.0)
Platelets: 217 10*3/uL (ref 150.0–400.0)
RBC: 3.96 Mil/uL (ref 3.87–5.11)
RDW: 14.1 % (ref 11.5–15.5)
WBC: 8.4 10*3/uL (ref 4.0–10.5)

## 2022-11-04 LAB — BASIC METABOLIC PANEL
BUN: 20 mg/dL (ref 6–23)
CO2: 28 mEq/L (ref 19–32)
Calcium: 9.1 mg/dL (ref 8.4–10.5)
Chloride: 101 mEq/L (ref 96–112)
Creatinine, Ser: 0.8 mg/dL (ref 0.40–1.20)
GFR: 72.18 mL/min (ref >60.00)
Glucose, Bld: 85 mg/dL (ref 70–99)
Potassium: 4.2 mEq/L (ref 3.5–5.1)
Sodium: 140 mEq/L (ref 135–145)

## 2022-11-04 LAB — TSH: TSH: 2.58 u[IU]/mL (ref 0.35–5.50)

## 2022-11-04 MED ORDER — CETIRIZINE HCL 10 MG PO TABS: 10.0000 mg | ORAL_TABLET | Freq: Every day | ORAL | 0 refills | Status: DC

## 2022-11-04 NOTE — Assessment & Plan Note (Signed)
Certainly could be related to starting the valsartan.  Will check an EKG and lab work today.  Advised to seek medical attention if she has persistent palpitations.

## 2022-11-04 NOTE — Progress Notes (Signed)
Marikay Alar, MD Phone: [phone]  Dana Obrien is a 77 y.o. female who presents today for f/u.  HYPERTENSION Disease Monitoring Home BP Monitoring 160s/80s Chest pain- no    Dyspnea- no Medications Compliance-  taking HCTZ, valsartan.   Edema- no BMET    Component Value Date/Time   NA 141 09/08/2022 2057   NA 139 11/20/2013 1534   K 3.3 (L) 09/08/2022 2057   K 3.9 11/20/2013 1534   CL 103 09/08/2022 2057   CL 105 11/20/2013 1534   CO2 29 09/08/2022 2057   CO2 32 11/20/2013 1534   GLUCOSE 122 (H) 09/08/2022 2057   GLUCOSE 104 (H) 11/20/2013 1534   BUN 15 09/08/2022 2057   BUN 20 (H) 11/20/2013 1534   CREATININE 0.72 09/08/2022 2057   CREATININE 1.12 (H) 11/01/2015 1622   CALCIUM 8.9 09/08/2022 2057   CALCIUM 9.2 11/20/2013 1534   GFRNONAA >60 09/08/2022 2057   GFRNONAA >60 11/20/2013 1534   GFRAA >60 04/10/2019 0240   GFRAA >60 11/20/2013 1534   Palpitations: Patient notes her heart has been fluttering when she wakes up in the morning the last few days.  It resolves when she gets up and moves around.  Social History   Tobacco Use  Smoking Status Never  Smokeless Tobacco Never    Current Outpatient Medications on File Prior to Visit  Medication Sig Dispense Refill   albuterol (VENTOLIN HFA) 108 (90 Base) MCG/ACT inhaler Inhale 2 puffs into the lungs every 6 (six) hours as needed for wheezing or shortness of breath. 8 g 0   ALPRAZolam (XANAX) 0.25 MG tablet Take 1 tablet (0.25 mg total) by mouth 2 (two) times daily as needed. for anxiety 20 tablet 0   buPROPion (WELLBUTRIN XL) 150 MG 24 hr tablet Take 1 tablet (150 mg total) by mouth daily. 30 tablet 3   celecoxib (CELEBREX) 100 MG capsule Take 1 capsule by mouth twice daily 60 capsule 0   EQ ARTHRITIS PAIN RELIEVER 1 % GEL APPLY 2 GRAMS TOPICALLY FOUR TIMES DAILY AS NEEDED 50 g 0   FLUoxetine (PROZAC) 20 MG capsule Take 3 capsules (60 mg total) by mouth daily. 90 capsule 3   fluticasone (FLONASE) 50  MCG/ACT nasal spray Place 1 spray into both nostrils daily. 16 g 0   methocarbamol (ROBAXIN) 500 MG tablet TAKE 1 TABLET BY MOUTH EVERY 8 HOURS AS NEEDED FOR MUSCLE SPASM 30 tablet 0   Multiple Vitamin (MULTI-VITAMIN) tablet Take 1 tablet by mouth daily.      Omega-3 Fatty Acids (FISH OIL PO) Take 1 tablet by mouth daily.     ondansetron (ZOFRAN) 4 MG tablet Take 1 tablet (4 mg total) by mouth every 8 (eight) hours as needed for nausea or vomiting. 20 tablet 0   pantoprazole (PROTONIX) 40 MG tablet Take 1 tablet (40 mg total) by mouth daily. 90 tablet 0   rosuvastatin (CRESTOR) 40 MG tablet Take 1 tablet (40 mg total) by mouth daily. 90 tablet 3   senna (SENOKOT) 8.6 MG TABS tablet Take 1 tablet by mouth at bedtime.      SUMAtriptan (IMITREX) 50 MG tablet Take 1 tablet (50 mg total) by mouth every 2 (two) hours as needed for migraine. May repeat in 2 hours if headache persists or recurs. No more than 2 doses in a 24 hour time frame. 10 tablet 0   traMADol (ULTRAM) 50 MG tablet Take 1 tablet (50 mg total) by mouth every 6 (six) hours as needed.  30 tablet 0   tretinoin (RETIN-A) 0.05 % cream Apply 1 application topically at bedtime. 45 g 0   valsartan (DIOVAN) 160 MG tablet Take 1 tablet (160 mg total) by mouth daily. 90 tablet 3   No current facility-administered medications on file prior to visit.     ROS see history of present illness  Objective  Physical Exam Vitals:   11/04/22 0925  BP: 126/86  Pulse: 77  Temp: 98.1 F (36.7 C)  SpO2: 91%    BP Readings from Last 3 Encounters:  11/04/22 126/86  11/02/22 134/84  10/28/22 125/82   Wt Readings from Last 3 Encounters:  11/04/22 199 lb (90.3 kg)  11/02/22 199 lb 12.8 oz (90.6 kg)  10/28/22 193 lb (87.5 kg)    Physical Exam Constitutional:      General: She is not in acute distress.    Appearance: She is not diaphoretic.  Cardiovascular:     Rate and Rhythm: Normal rate and regular rhythm.     Heart sounds: Normal heart  sounds.  Pulmonary:     Effort: Pulmonary effort is normal.     Breath sounds: Normal breath sounds.  Skin:    General: Skin is warm and dry.  Neurological:     Mental Status: She is alert.    EKG: Sinus rhythm, rate 69, no arrhythmias, no apparent ST or T wave changes  Assessment/Plan: Please see individual problem list.  Primary hypertension Assessment & Plan: Chronic issue.  Blood pressure in office is decently controlled for her age.  Home BPs are quite a bit higher.  I wonder if her blood pressure cuff is inaccurate.  For now she will continue HCTZ 25 mg daily and valsartan 160 mg daily.  She will return for nurse blood pressure check in the next 1 to 2 weeks.  Lab work as outlined.  Orders: -     Basic metabolic panel  Palpitations Assessment & Plan: Certainly could be related to starting the valsartan.  Will check an EKG and lab work today.  Advised to seek medical attention if she has persistent palpitations.  Orders: -     Basic metabolic panel -     TSH -     CBC -     EKG 12-Lead  Other orders -     Cetirizine HCl; Take 1 tablet (10 mg total) by mouth daily.  Dispense: 90 tablet; Refill: 0    Return in about 2 weeks (around 11/18/2022) for Nurse BP check, 3 months PCP.   Marikay Alar, MD [facility]

## 2022-11-04 NOTE — Patient Instructions (Signed)
Nice to see you. We will contact you with the lab results. If you develop persistent palpitations please seek medical attention immediately.

## 2022-11-04 NOTE — Assessment & Plan Note (Signed)
Chronic issue.  Blood pressure in office is decently controlled for her age.  Home BPs are quite a bit higher.  I wonder if her blood pressure cuff is inaccurate.  For now she will continue HCTZ 25 mg daily and valsartan 160 mg daily.  She will return for nurse blood pressure check in the next 1 to 2 weeks.  Lab work as outlined.

## 2022-11-06 ENCOUNTER — Telehealth: Payer: Self-pay

## 2022-11-06 DIAGNOSIS — R002 Palpitations: Secondary | ICD-10-CM

## 2022-11-06 NOTE — Telephone Encounter (Signed)
-----   Message from Glori Luis, MD sent at 11/06/2022 10:03 AM EDT ----- Please let the patient know that her labs did not reveal a cause for her palpitations. I would suggest having her do a heart monitor to see if we can identify an underlying cause of the palpitations. I can order this if she is willing.

## 2022-11-06 NOTE — Telephone Encounter (Signed)
Left message to call the office back regarding lab results  

## 2022-11-09 NOTE — Telephone Encounter (Signed)
Pt returned [facility] CMA call. Pt said yes she's agree to have the heart monitor.

## 2022-11-12 ENCOUNTER — Other Ambulatory Visit: Payer: Self-pay

## 2022-11-13 ENCOUNTER — Ambulatory Visit: Payer: PPO | Attending: Family Medicine

## 2022-11-13 ENCOUNTER — Other Ambulatory Visit: Payer: Self-pay

## 2022-11-13 DIAGNOSIS — R002 Palpitations: Secondary | ICD-10-CM

## 2022-11-13 MED ORDER — CELECOXIB 100 MG PO CAPS: 100.0000 mg | ORAL_CAPSULE | Freq: Two times a day (BID) | ORAL | 0 refills | Status: DC

## 2022-11-13 NOTE — Telephone Encounter (Signed)
Pt returned call. As per pt, she's not wearing no devices at the moment.

## 2022-11-13 NOTE — Telephone Encounter (Signed)
Called Patient to let her know the Zio monitor has been ordered and she will receive it in her mailbox.

## 2022-11-13 NOTE — Addendum Note (Signed)
Addended by: Prince Solian A on: 11/13/2022 03:12 PM   Modules accepted: Orders

## 2022-11-22 ENCOUNTER — Other Ambulatory Visit: Payer: Self-pay | Admitting: Family Medicine

## 2022-11-23 ENCOUNTER — Ambulatory Visit (INDEPENDENT_AMBULATORY_CARE_PROVIDER_SITE_OTHER): Payer: PPO

## 2022-11-23 ENCOUNTER — Other Ambulatory Visit: Payer: Self-pay

## 2022-11-23 VITALS — BP 148/92 | HR 78 | Resp 98

## 2022-11-23 DIAGNOSIS — I10 Essential (primary) hypertension: Secondary | ICD-10-CM

## 2022-11-23 MED ORDER — FLUOXETINE HCL 20 MG PO CAPS: 60.0000 mg | ORAL_CAPSULE | Freq: Every day | ORAL | 3 refills | Status: DC

## 2022-11-23 MED ORDER — BUPROPION HCL ER (XL) 150 MG PO TB24: 150.0000 mg | ORAL_TABLET | Freq: Every day | ORAL | 3 refills | Status: DC

## 2022-11-23 MED ORDER — HYDROCHLOROTHIAZIDE 25 MG PO TABS: 25.0000 mg | ORAL_TABLET | Freq: Every day | ORAL | 3 refills | Status: DC

## 2022-11-23 MED ORDER — VALSARTAN 320 MG PO TABS: 320.0000 mg | ORAL_TABLET | Freq: Every day | ORAL | 3 refills | Status: DC

## 2022-11-23 NOTE — Telephone Encounter (Signed)
Pt present for NV.  Per Dr. Birdie Sons: Increase Valsartan to 320mg  daily schedule.Pt also reported that she has misplaced fluoxetine 20 mg refill and requested prescription sent.  She thinks that it was thrown away.  She is aware that insurance may not cover.  Also requested refills for hydrochlorothiazide and bupropion.  Medications pended for Dr. Birdie Sons signature.

## 2022-11-23 NOTE — Progress Notes (Signed)
Patient here for nurse visit BP check per order from Dr. Marikay Alar.   Patient reports compliance with prescribed BP medications: yes, Valsartan 160mg  and hydrochlorothiazide 25  Last dose of BP medication: Today  BP Readings from Last 3 Encounters:  11/23/22 (!) 148/92  11/04/22 124/82  11/02/22 134/84   Pulse Readings from Last 3 Encounters:  11/23/22 78  11/04/22 77  11/02/22 68    Per Dr. Birdie Sons: Increase Valsartan to 320mg  daily schedule an appointment with him for BP follow up and labs.  Appointment scheduled for 12/04/2022 BMET lab ordered for future.    Patient verbalized understanding of instructions.   Pt also reported that she has misplaced fluoxetine 20 mg refill and requested prescription sent.  She thinks that it was thrown away.  She is aware that insurance may not cover.  Also requested refills for hydrochlorothiazide and bupropion.  Medications pended for Dr. Birdie Sons signature.  See telephone note for all pended prescriptions.    Valentino Nose, RN

## 2022-11-25 ENCOUNTER — Other Ambulatory Visit: Payer: Self-pay | Admitting: Family Medicine

## 2022-11-25 ENCOUNTER — Encounter: Payer: Self-pay | Admitting: Family Medicine

## 2022-11-26 DIAGNOSIS — R002 Palpitations: Secondary | ICD-10-CM | POA: Diagnosis not present

## 2022-12-04 ENCOUNTER — Ambulatory Visit: Payer: PPO | Admitting: Family Medicine

## 2022-12-12 DIAGNOSIS — R002 Palpitations: Secondary | ICD-10-CM | POA: Diagnosis not present

## 2022-12-17 ENCOUNTER — Emergency Department: Payer: PPO

## 2022-12-17 ENCOUNTER — Encounter: Payer: Self-pay | Admitting: *Deleted

## 2022-12-17 ENCOUNTER — Other Ambulatory Visit: Payer: Self-pay

## 2022-12-17 ENCOUNTER — Emergency Department
Admission: EM | Admit: 2022-12-17 | Discharge: 2022-12-17 | Disposition: A | Discharge status: Home / Self Care | Payer: PPO | Attending: Emergency Medicine | Admitting: Emergency Medicine

## 2022-12-17 DIAGNOSIS — S0101XA Laceration without foreign body of scalp, initial encounter: Secondary | ICD-10-CM

## 2022-12-17 DIAGNOSIS — W19XXXA Unspecified fall, initial encounter: Secondary | ICD-10-CM

## 2022-12-17 DIAGNOSIS — W01198A Fall on same level from slipping, tripping and stumbling with subsequent striking against other object, initial encounter: Secondary | ICD-10-CM | POA: Insufficient documentation

## 2022-12-17 DIAGNOSIS — Z23 Encounter for immunization: Secondary | ICD-10-CM | POA: Insufficient documentation

## 2022-12-17 DIAGNOSIS — S0990XA Unspecified injury of head, initial encounter: Secondary | ICD-10-CM | POA: Diagnosis not present

## 2022-12-17 DIAGNOSIS — I6782 Cerebral ischemia: Secondary | ICD-10-CM | POA: Diagnosis not present

## 2022-12-17 MED ORDER — TETANUS-DIPHTH-ACELL PERTUSSIS 5-2.5-18.5 LF-MCG/0.5 IM SUSY
0.5000 mL | PREFILLED_SYRINGE | Freq: Once | INTRAMUSCULAR | Status: AC
  Administered 2022-12-17: 0.5 mL via INTRAMUSCULAR
  Filled 2022-12-17: qty 0.5

## 2022-12-17 NOTE — Discharge Instructions (Signed)
You may shower and get Dermabond wet.  Allow Dermabond to fall off on its own.  Do not submerge laceration site underwater.  Return to the ER for any severe headaches, vomiting, vision changes or any urgent changes in your health

## 2022-12-17 NOTE — ED Triage Notes (Signed)
Pt tripped over a rug and struck the corner of a drawer.  No loc.  Pt has laceration to scalp.   Bleeding controlled.  Pt alert  speech clear.

## 2022-12-17 NOTE — ED Provider Notes (Signed)
[place] EMERGENCY DEPARTMENT AT [facility] Provider Note   CSN: [id number] Arrival date & time: 12/17/22  1715     History  Chief Complaint  Patient presents with   Laceration    Dana Obrien is a 77 y.o. female.  Presents to the emergency department for evaluation of head laceration.  She suffered a scalp laceration along the frontal portion just prior to arrival.  She tripped, fell and hit the corner of a drawer.  No LOC nausea or vomiting.  Has some soreness along the frontal portion of the head.  She has been ambulatory and denies any other pain or discomfort.  Patient states she tripped and suffered a mechanical fall.  No dizziness or lightheadedness prior to her fall.  She is not on a blood thinner.  Tetanus not up-to-date.  HPI     Home Medications Prior to Admission medications   Medication Sig Start Date End Date Taking? Authorizing Provider  albuterol (VENTOLIN HFA) 108 (90 Base) MCG/ACT inhaler Inhale 2 puffs into the lungs every 6 (six) hours as needed for wheezing or shortness of breath. 06/19/20   Glori Luis, MD  ALPRAZolam Prudy Feeler) 0.25 MG tablet Take 1 tablet by mouth twice daily as needed for anxiety 11/23/22   Glori Luis, MD  buPROPion (WELLBUTRIN XL) 150 MG 24 hr tablet Take 1 tablet (150 mg total) by mouth daily. 11/23/22   Glori Luis, MD  celecoxib (CELEBREX) 100 MG capsule Take 1 capsule (100 mg total) by mouth 2 (two) times daily. 11/13/22   Glori Luis, MD  cetirizine (EQ ALLERGY RELIEF, CETIRIZINE,) 10 MG tablet Take 1 tablet (10 mg total) by mouth daily. 11/04/22   Glori Luis, MD  EQ ARTHRITIS PAIN RELIEVER 1 % GEL APPLY 2 GRAMS TOPICALLY FOUR TIMES DAILY AS NEEDED 04/17/22   Glori Luis, MD  FLUoxetine (PROZAC) 20 MG capsule Take 3 capsules (60 mg total) by mouth daily. 11/23/22   Glori Luis, MD  fluticasone (FLONASE) 50 MCG/ACT nasal spray Place 1 spray into both nostrils daily. 07/02/21    Glori Luis, MD  hydrochlorothiazide (HYDRODIURIL) 25 MG tablet Take 1 tablet (25 mg total) by mouth daily. 11/23/22   Glori Luis, MD  methocarbamol (ROBAXIN) 500 MG tablet TAKE 1 TABLET BY MOUTH EVERY 8 HOURS AS NEEDED FOR MUSCLE SPASM 09/17/22   Magnant, Joycie Peek, PA-C  Multiple Vitamin (MULTI-VITAMIN) tablet Take 1 tablet by mouth daily.     [provider]  Omega-3 Fatty Acids (FISH OIL PO) Take 1 tablet by mouth daily.    [provider]  ondansetron (ZOFRAN) 4 MG tablet Take 1 tablet (4 mg total) by mouth every 8 (eight) hours as needed for nausea or vomiting. 07/09/22   Cammy Copa, MD  pantoprazole (PROTONIX) 40 MG tablet Take 1 tablet (40 mg total) by mouth daily. 10/27/22   Glori Luis, MD  rosuvastatin (CRESTOR) 40 MG tablet Take 1 tablet (40 mg total) by mouth daily. 07/22/22   Glori Luis, MD  senna (SENOKOT) 8.6 MG TABS tablet Take 1 tablet by mouth at bedtime.     [provider]  SUMAtriptan (IMITREX) 50 MG tablet Take 1 tablet (50 mg total) by mouth every 2 (two) hours as needed for migraine. May repeat in 2 hours if headache persists or recurs. No more than 2 doses in a 24 hour time frame. 10/27/22   Glori Luis, MD  traMADol Janean Sark)  50 MG tablet TAKE 1 TABLET BY MOUTH EVERY 6 HOURS AS NEEDED 11/23/22   Glori Luis, MD  tretinoin (RETIN-A) 0.05 % cream Apply 1 application topically at bedtime. 05/31/18   Glori Luis, MD  valsartan (DIOVAN) 320 MG tablet Take 1 tablet (320 mg total) by mouth daily. 11/23/22   Glori Luis, MD      Allergies    Lidocaine, Codeine, Morphine, Niacin, and Niacin and related    Review of Systems   Review of Systems  Physical Exam Updated Vital Signs BP (!) 166/88 (BP Location: Left Arm)   Pulse 81   Temp 98.3 F (36.8 C) (Oral)   Resp 19   Ht 5\' 4"  (1.626 m)   Wt 86.2 kg   SpO2 95%   BMI 32.61 kg/m  Physical Exam Constitutional:      Appearance: She is  well-developed.  HENT:     Head: Normocephalic and atraumatic.     Comments: Small 1 cm laceration frontal portion of the scalp.  No active bleeding.  No hematoma.  Mild tenderness to palpation.    Right Ear: Ear canal and external ear normal.     Left Ear: Ear canal and external ear normal.     Nose: Nose normal.     Mouth/Throat:     Mouth: Mucous membranes are moist.  Eyes:     Extraocular Movements: Extraocular movements intact.     Conjunctiva/sclera: Conjunctivae normal.     Pupils: Pupils are equal, round, and reactive to light.  Cardiovascular:     Rate and Rhythm: Normal rate.  Pulmonary:     Effort: Pulmonary effort is normal. No respiratory distress.  Musculoskeletal:        General: Normal range of motion.     Cervical back: Normal range of motion.  Skin:    General: Skin is warm.     Capillary Refill: Capillary refill takes less than 2 seconds.     Findings: No rash.  Neurological:     General: No focal deficit present.     Mental Status: She is alert and oriented to person, place, and time.     Cranial Nerves: No cranial nerve deficit.     Motor: No weakness.     Gait: Gait normal.  Psychiatric:        Mood and Affect: Mood normal.        Behavior: Behavior normal.        Thought Content: Thought content normal.     ED Results / Procedures / Treatments   Labs (all labs ordered are listed, but only abnormal results are displayed) Labs Reviewed - No data to display  EKG None  Radiology CT Head Wo Contrast  Result Date: 12/17/2022 CLINICAL DATA:  Head trauma. Minor. Patient tripped over a rug and struck corner of a drop. EXAM: CT HEAD WITHOUT CONTRAST TECHNIQUE: Contiguous axial images were obtained from the base of the skull through the vertex without intravenous contrast. RADIATION DOSE REDUCTION: This exam was performed according to the departmental dose-optimization program which includes automated exposure control, adjustment of the mA and/or kV  according to patient size and/or use of iterative reconstruction technique. COMPARISON:  CT examination dated July 16, 2020 FINDINGS: Brain: No evidence of acute infarction, hemorrhage, hydrocephalus, extra-axial collection or mass lesion/mass effect. Prominence of the ventricles and sulci secondary to mild generalized cerebral atrophy. Mild chronic microvascular ischemic changes of the periventricular white matter. Vascular: No hyperdense vessel or unexpected calcification.  Skull: Normal. Negative for fracture or focal lesion. Sinuses/Orbits: No acute finding. Other: None. IMPRESSION: 1. No acute intracranial abnormality. 2. Mild generalized cerebral atrophy and chronic microvascular ischemic changes of the periventricular white matter. Electronically Signed   By: Larose Hires D.O.   On: 12/17/2022 18:43    Procedures .Marland KitchenLaceration Repair  Date/Time: 12/17/2022 6:53 PM  Performed by: Evon Slack, PA-C Authorized by: Evon Slack, PA-C   Consent:    Consent obtained:  Verbal   Consent given by:  Patient   Risks discussed:  Pain and poor wound healing Anesthesia:    Anesthesia method:  None Laceration details:    Location:  Scalp   Scalp location:  Frontal   Length (cm):  1.5   Depth (mm):  2 Exploration:    Limited defect created (wound extended): no     Contaminated: no   Treatment:    Area cleansed with:  Povidone-iodine   Amount of cleaning:  Standard   Irrigation solution:  Sterile saline   Irrigation method:  Tap Skin repair:    Repair method:  Tissue adhesive Approximation:    Approximation:  Close Repair type:    Repair type:  Simple Post-procedure details:    Dressing:  Open (no dressing)   Procedure completion:  Tolerated     Medications Ordered in ED Medications  Tdap (BOOSTRIX) injection 0.5 mL (0.5 mLs Intramuscular Given 12/17/22 1841)    ED Course/ Medical Decision Making/ A&P                             Medical Decision Making Amount and/or  Complexity of Data Reviewed Radiology: ordered.   77 year old female with mechanical fall, hit her head on the corner of a cabinet.  Suffered a small laceration to the forehead.  No other injury to her body.  She is neurovascular intact.  CT of the head negative for any acute intracranial abnormality.  Laceration thoroughly cleansed irrigated and repaired with Dermabond.  Patient appears well and stable and ready for discharge to home.  Patient and family understand signs symptoms return to the ER for.  They are educated on wound care. Final Clinical Impression(s) / ED Diagnoses Final diagnoses:  Fall, initial encounter  Laceration of scalp, initial encounter    Rx / DC Orders ED Discharge Orders     None         Ronnette Juniper 12/17/22 Dory Larsen, MD 12/17/22 1925

## 2022-12-22 ENCOUNTER — Other Ambulatory Visit: Payer: Self-pay | Admitting: Family Medicine

## 2022-12-22 DIAGNOSIS — R002 Palpitations: Secondary | ICD-10-CM

## 2022-12-23 ENCOUNTER — Telehealth: Payer: Self-pay

## 2022-12-23 ENCOUNTER — Other Ambulatory Visit: Payer: Self-pay

## 2022-12-23 DIAGNOSIS — G43809 Other migraine, not intractable, without status migrainosus: Secondary | ICD-10-CM

## 2022-12-23 MED ORDER — SUMATRIPTAN SUCCINATE 50 MG PO TABS: 50.0000 mg | ORAL_TABLET | ORAL | 0 refills | Status: DC | PRN

## 2022-12-23 NOTE — Telephone Encounter (Signed)
Left message to call the office back to let her know Dr. Birdie Sons did place the Cardiology order.

## 2022-12-23 NOTE — Telephone Encounter (Signed)
-----   Message from Marikay Alar sent at 12/21/2022  4:49 PM EDT ----- Noted.  I would like to refer her to cardiology for evaluation of the palpitations.  I can place that referral once you speak with her.  Please see what company she uses for her BiPAP and we can request a compliance report.  She does need to follow-up with pulmonology who was previously managing her BiPAP.

## 2022-12-24 ENCOUNTER — Encounter: Payer: Self-pay | Admitting: Family Medicine

## 2022-12-24 NOTE — Telephone Encounter (Signed)
Too soon for cortisone. Could try gel injections if we can get them approved. Alternatively, could try toradol as a temporary but likely shortlived option

## 2022-12-24 NOTE — Telephone Encounter (Signed)
OV with provider. No problemo

## 2022-12-24 NOTE — Telephone Encounter (Signed)
Sure thing!

## 2022-12-25 ENCOUNTER — Ambulatory Visit (INDEPENDENT_AMBULATORY_CARE_PROVIDER_SITE_OTHER): Payer: PPO

## 2022-12-25 ENCOUNTER — Ambulatory Visit (INDEPENDENT_AMBULATORY_CARE_PROVIDER_SITE_OTHER)
Admission: EM | Admit: 2022-12-25 | Discharge: 2022-12-25 | Disposition: A | Discharge status: Home / Self Care | Payer: PPO | Source: Home / Self Care

## 2022-12-25 ENCOUNTER — Emergency Department
Admission: EM | Admit: 2022-12-25 | Discharge: 2022-12-26 | Disposition: A | Discharge status: Home / Self Care | Payer: PPO | Attending: Emergency Medicine | Admitting: Emergency Medicine

## 2022-12-25 ENCOUNTER — Telehealth: Payer: Self-pay

## 2022-12-25 ENCOUNTER — Encounter: Payer: Self-pay | Admitting: Emergency Medicine

## 2022-12-25 ENCOUNTER — Telehealth: Payer: Self-pay | Admitting: Family Medicine

## 2022-12-25 ENCOUNTER — Other Ambulatory Visit: Payer: Self-pay | Admitting: Family Medicine

## 2022-12-25 ENCOUNTER — Other Ambulatory Visit: Payer: Self-pay

## 2022-12-25 ENCOUNTER — Encounter: Payer: Self-pay | Admitting: Radiology

## 2022-12-25 DIAGNOSIS — E8770 Fluid overload, unspecified: Secondary | ICD-10-CM | POA: Diagnosis not present

## 2022-12-25 DIAGNOSIS — I440 Atrioventricular block, first degree: Secondary | ICD-10-CM | POA: Insufficient documentation

## 2022-12-25 DIAGNOSIS — N281 Cyst of kidney, acquired: Secondary | ICD-10-CM | POA: Diagnosis not present

## 2022-12-25 DIAGNOSIS — R42 Dizziness and giddiness: Secondary | ICD-10-CM | POA: Insufficient documentation

## 2022-12-25 DIAGNOSIS — R531 Weakness: Secondary | ICD-10-CM | POA: Insufficient documentation

## 2022-12-25 DIAGNOSIS — R0609 Other forms of dyspnea: Secondary | ICD-10-CM | POA: Insufficient documentation

## 2022-12-25 DIAGNOSIS — I4510 Unspecified right bundle-branch block: Secondary | ICD-10-CM | POA: Insufficient documentation

## 2022-12-25 DIAGNOSIS — R0602 Shortness of breath: Secondary | ICD-10-CM

## 2022-12-25 DIAGNOSIS — I517 Cardiomegaly: Secondary | ICD-10-CM | POA: Diagnosis not present

## 2022-12-25 DIAGNOSIS — Z0389 Encounter for observation for other suspected diseases and conditions ruled out: Secondary | ICD-10-CM | POA: Diagnosis not present

## 2022-12-25 DIAGNOSIS — I700 Atherosclerosis of aorta: Secondary | ICD-10-CM | POA: Diagnosis not present

## 2022-12-25 DIAGNOSIS — K449 Diaphragmatic hernia without obstruction or gangrene: Secondary | ICD-10-CM | POA: Diagnosis not present

## 2022-12-25 DIAGNOSIS — M7989 Other specified soft tissue disorders: Secondary | ICD-10-CM | POA: Insufficient documentation

## 2022-12-25 DIAGNOSIS — R918 Other nonspecific abnormal finding of lung field: Secondary | ICD-10-CM | POA: Diagnosis not present

## 2022-12-25 DIAGNOSIS — R0600 Dyspnea, unspecified: Secondary | ICD-10-CM | POA: Diagnosis not present

## 2022-12-25 LAB — COMPREHENSIVE METABOLIC PANEL
ALT: 17 U/L (ref 0–44)
AST: 22 U/L (ref 15–41)
Albumin: 4 g/dL (ref 3.5–5.0)
Alkaline Phosphatase: 55 U/L (ref 38–126)
Anion gap: 8 (ref 5–15)
BUN: 19 mg/dL (ref 8–23)
CO2: 29 mmol/L (ref 22–32)
Calcium: 9.2 mg/dL (ref 8.9–10.3)
Chloride: 98 mmol/L (ref 98–111)
Creatinine, Ser: 0.79 mg/dL (ref 0.44–1.00)
GFR, Estimated: >60 mL/min (ref >60)
Glucose, Bld: 93 mg/dL (ref 70–99)
Potassium: 3.2 mmol/L — ABNORMAL LOW (ref 3.5–5.1)
Sodium: 135 mmol/L (ref 135–145)
Total Bilirubin: 0.6 mg/dL (ref 0.3–1.2)
Total Protein: 7.4 g/dL (ref 6.5–8.1)

## 2022-12-25 LAB — BRAIN NATRIURETIC PEPTIDE: B Natriuretic Peptide: 108.9 pg/mL — ABNORMAL HIGH (ref 0.0–100.0)

## 2022-12-25 LAB — CBC WITH DIFFERENTIAL/PLATELET
Abs Immature Granulocytes: 0.02 10*3/uL (ref 0.00–0.07)
Basophils Absolute: 0.1 10*3/uL (ref 0.0–0.1)
Basophils Relative: 1 %
Eosinophils Absolute: 0.3 10*3/uL (ref 0.0–0.5)
Eosinophils Relative: 4 %
HCT: 34.3 % — ABNORMAL LOW (ref 36.0–46.0)
Hemoglobin: 11.3 g/dL — ABNORMAL LOW (ref 12.0–15.0)
Immature Granulocytes: 0 %
Lymphocytes Relative: 27 %
Lymphs Abs: 2.2 10*3/uL (ref 0.7–4.0)
MCH: 31.2 pg (ref 26.0–34.0)
MCHC: 32.9 g/dL (ref 30.0–36.0)
MCV: 94.8 fL (ref 80.0–100.0)
Monocytes Absolute: 0.8 10*3/uL (ref 0.1–1.0)
Monocytes Relative: 10 %
Neutro Abs: 4.7 10*3/uL (ref 1.7–7.7)
Neutrophils Relative %: 58 %
Platelets: 218 10*3/uL (ref 150–400)
RBC: 3.62 MIL/uL — ABNORMAL LOW (ref 3.87–5.11)
RDW: 13.7 % (ref 11.5–15.5)
WBC: 8 10*3/uL (ref 4.0–10.5)
nRBC: 0 % (ref 0.0–0.2)

## 2022-12-25 LAB — TSH: TSH: 2.388 u[IU]/mL (ref 0.350–4.500)

## 2022-12-25 LAB — TROPONIN I (HIGH SENSITIVITY)
Troponin I (High Sensitivity): 3 ng/L (ref <18)
Troponin I (High Sensitivity): 5 ng/L (ref <18)
Troponin I (High Sensitivity): 4 ng/L (ref <18)

## 2022-12-25 MED ORDER — POTASSIUM CHLORIDE 20 MEQ PO PACK
20.0000 meq | PACK | Freq: Two times a day (BID) | ORAL | Status: DC
  Administered 2022-12-25: 20 meq via ORAL
  Filled 2022-12-25: qty 1

## 2022-12-25 MED ORDER — FUROSEMIDE 20 MG PO TABS: 20.0000 mg | ORAL_TABLET | Freq: Every day | ORAL | 1 refills | Status: DC

## 2022-12-25 MED ORDER — POTASSIUM CHLORIDE 20 MEQ PO PACK: 20.0000 meq | PACK | Freq: Every day | ORAL | 1 refills | Status: DC

## 2022-12-25 MED ORDER — IPRATROPIUM-ALBUTEROL 0.5-2.5 (3) MG/3ML IN SOLN
3.0000 mL | Freq: Once | RESPIRATORY_TRACT | Status: AC
  Administered 2022-12-25: 3 mL via RESPIRATORY_TRACT
  Filled 2022-12-25: qty 3

## 2022-12-25 NOTE — Telephone Encounter (Signed)
Patient is currently being evaluated at urgent care.

## 2022-12-25 NOTE — Discharge Instructions (Addendum)
Please go to the emergency department at [facility] to be evaluated for your ongoing symptoms and your EKG changes.

## 2022-12-25 NOTE — Telephone Encounter (Addendum)
   Attempted to call no answer Left message for Patient to call the office back  Called again no answer Called daughter Aundra Millet no answer

## 2022-12-25 NOTE — Telephone Encounter (Signed)
Pt daughter called in stating the pt has shortness of breath and weakness after she exert herself. Pt daughter states pt is having no pain Sent to access nurse

## 2022-12-25 NOTE — Telephone Encounter (Signed)
Access nurse called stating the pt is not willing to go the hospital and access nurse want the pt to be seen in three to fours. We do not have any available appointments today

## 2022-12-25 NOTE — Telephone Encounter (Signed)
Refilled yesterday. Request needs to be refused.

## 2022-12-25 NOTE — ED Triage Notes (Signed)
Pt states she was seen at urgent care today for shortness of breath and they referred her to come here. Pt states after a fall a few weeks ago she has just been getting weaker and weaker. Pt daughter states that she recently wore a cardiac monitor which showed some super ventricular events. Pt daughter is a Engineer, civil (consulting). Pt also endorses mid sternal chest pain that she believes is related to anxiety. Pt has been out of her xanax that she has been taking for years for the past 3 days.

## 2022-12-25 NOTE — Telephone Encounter (Signed)
Of note, patient is currently at urgent care for evaluation of this.

## 2022-12-25 NOTE — Telephone Encounter (Signed)
Patient is talking about Cardiology and Pulmonology visits for the palpitations.

## 2022-12-25 NOTE — Discharge Instructions (Signed)
Please follow up with your primary care. Please seek medical attention for any high fevers, chest pain, shortness of breath, change in behavior, persistent vomiting, bloody stool or any other new or concerning symptoms.

## 2022-12-25 NOTE — ED Provider Notes (Signed)
[facility] Provider Note    Event Date/Time   First MD Initiated Contact with Patient 12/25/22 1943     (approximate)   History   Shortness of Breath and Chest Pain   HPI  Dana Obrien is a 77 y.o. female who presents to the emergency department today from urgent care because of concerns for abnormal EKG.  Patient went to urgent care because of a couple week history of worsening shortness of breath and lightheadedness.  The patient notices mostly with exertion.  She denies any associated chest pain.  No cough.  In addition patient has noticed bilateral lower extremity swelling.  She does state this has been going on for quite some time.  She denies pain in her lower extremities.     Physical Exam   Triage Vital Signs: ED Triage Vitals  Encounter Vitals Group     BP 12/25/22 1841 (!) 177/88     Systolic BP Percentile --      Diastolic BP Percentile --      Pulse Rate 12/25/22 1841 70     Resp 12/25/22 1841 18     Temp 12/25/22 1841 98 F (36.7 C)     Temp Source 12/25/22 1841 Oral     SpO2 12/25/22 1841 97 %     Weight 12/25/22 1843 200 lb (90.7 kg)     Height 12/25/22 1843 5\' 4"  (1.626 m)     Head Circumference --      Peak Flow --      Pain Score 12/25/22 1842 4     Pain Loc --      Pain Education --      Exclude from Growth Chart --     Most recent vital signs: Vitals:   12/25/22 1942 12/25/22 1945  BP:    Pulse:    Resp:  19  Temp:    SpO2: 99%    General: Awake, alert, oriented. CV:  Good peripheral perfusion. Regular rate and rhythm. Resp:  Normal effort. Lungs clear. Abd:  No distention.  Other:  Lower extremity edema   ED Results / Procedures / Treatments   Labs (all labs ordered are listed, but only abnormal results are displayed) BNP 108.9 TSH 2.388 Trop 5 ->4   EKG  I, Phineas Semen, attending physician, personally viewed and interpreted this EKG  EKG Time: 1843 Rate: 71 Rhythm: sinus rhythm with  1st degree av block Axis: left axis deviation Intervals: qtc 452 QRS: narrow, q waves v1, III ST changes: no st elevation Impression: abnormal ekg   RADIOLOGY None   PROCEDURES:  Critical Care performed: No   MEDICATIONS ORDERED IN ED: Medications - No data to display   IMPRESSION / MDM / ASSESSMENT AND PLAN / ED COURSE  I reviewed the triage vital signs and the nursing notes.                              Differential diagnosis includes, but is not limited to, pneumonia, PE, CHF, asthma  Patient's presentation is most consistent with acute presentation with potential threat to life or bodily function.   The patient is on the cardiac monitor to evaluate for evidence of arrhythmia and/or significant heart rate changes.  Patient presented to the emergency department today from urgent care because of concerns for abnormal EKG.  EKG today shows type I AV block.  However EKG is not significantly altered from  previous EKGs.  She does have some shortness of breath.  BNP is slightly elevated today.  Did discuss this with the patient.  Discussed possible first dose of Lasix here however we will plan on holding off and having patient take in the morning show she does not have to get up to urinate overnight.  Awaiting D-dimer at time of signout to evaluate for possible PE.      FINAL CLINICAL IMPRESSION(S) / ED DIAGNOSES   Final diagnoses:  Shortness of breath  Hypervolemia, unspecified hypervolemia type    Note:  This document was prepared using Dragon voice recognition software and may include unintentional dictation errors.    Phineas Semen, MD 12/26/22 [phone]

## 2022-12-25 NOTE — Telephone Encounter (Signed)
Dana Millet Patient's daughter called back and stated they just pulled up at [facility] in [facility].

## 2022-12-25 NOTE — Telephone Encounter (Signed)
Spoke to Patient's daughter Aundra Millet they just arrived at [facility] in [place].

## 2022-12-25 NOTE — ED Provider Notes (Signed)
[facility]    CSN: [id number] Arrival date & time: 12/25/22  1506      History   Chief Complaint Chief Complaint  Patient presents with   Shortness of Breath   Weakness    HPI Dana Obrien is a 77 y.o. female.   HPI  77 year old female with a past medical history sniffing and for breast cancer, asthma, arthritis, anxiety, hyperlipidemia, OSA on CPAP, migraines, and hypertension presents for evaluation of 1/2 weeks of progressive weakness with associated shortness of breath on exertion.  She also has had some intermittent dizziness.  She endorses leg swelling and weight gain but denies syncope or chest pain.  No history of CHF.  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   Cancer [facility]) 2004   right lumpectomy, chemotherapy and radiation Dr. Koleen Nimrod   CPAP (continuous positive airway pressure) dependence    Depression    GERD (gastroesophageal reflux disease)    Hypertension    Migraine    Personal history of chemotherapy 2004   Personal history of radiation therapy 2004   Personal history of radiation therapy 2019   PONV (postoperative nausea and vomiting)    Shingles    Sleep apnea    uses CPAP, severe OSA   Vitamin D deficiency    IN THE PAST    Patient Active Problem List   Diagnosis Date Noted   Palpitations 11/04/2022   MDD (major depressive disorder), recurrent episode, moderate (HCC) 09/09/2022   Major depressive disorder, single episode, severe without psychosis (HCC) 09/09/2022   Hydronephrosis 07/21/2022   Left lower quadrant pain 04/20/2022   Pustule 12/09/2021   Tick bite of right upper arm 12/09/2021   Urge incontinence 07/11/2021   Right knee pain 11/08/2020   Concussion  with no loss of consciousness 08/08/2020   Vitamin D deficiency 08/08/2020   Gastroesophageal reflux disease without esophagitis 03/25/2020   Dermatitis due to plants, including poison ivy, sumac, and oak 02/29/2020   Neuropathy 12/15/2019   Allergic rhinitis 07/05/2019   Brain atrophy (HCC) 05/18/2019   Bilateral hearing loss 08/15/2018   Decreased energy 08/15/2018   Prediabetes 06/02/2018   Arthralgia 06/02/2018   BPPV (benign paroxysmal positional vertigo), right 03/11/2018   Dysfunction of right eustachian tube 03/11/2018   Malignant neoplasm of upper-outer quadrant of left breast in female, 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/14/2017   Fatty liver 08/24/2017   Hearing difficulty of both ears 06/25/2017   Bilateral sensorineural hearing loss 10/13/2016   Right ear impacted cerumen 10/13/2016   GERD (gastroesophageal reflux disease) 09/07/2016   Uterine prolapse 11/14/2015   OSA (obstructive sleep apnea) 11/01/2015   Back muscle spasm 04/30/2015   DDD (degenerative disc disease), lumbar 04/30/2015   Lumbar radiculitis 04/30/2015   Low back pain 01/01/2015   Varicose vein of leg 11/22/2014   Increased frequency of urination 11/17/2013   Rectocele 11/17/2013   Cystocele 10/25/2013   Skin lesion 10/25/2013   Disorder of skin and subcutaneous tissue 10/25/2013   Midline cystocele 10/25/2013   Obesity, unspecified 10/19/2012   History of breast cancer 02/11/2012   Hyperlipidemia 05/11/2011   Osteopenia 05/11/2011   Disorder of bone and cartilage  05/11/2011   Anxiety and depression 03/04/2011   Hypertension 03/04/2011    Past Surgical History:  Procedure Laterality Date   ABDOMINAL HYSTERECTOMY     ANTERIOR AND POSTERIOR REPAIR     BREAST BIOPSY Right 2004   stereo. infiltrating ductal carcinoma   BREAST BIOPSY Left 09/07/2017   US guided biopsy/positive- invasaive mammary carcinoma   BREAST EXCISIONAL BIOPSY Right 11/29/2002   Multifocal infiltrating ductal carcinoma  with evidence of LCIS.  3.5 cm maximum diameter, minimal margins 5 mm.  In situ component less than 10%.   BREAST LUMPECTOMY Right 2004   BREAST LUMPECTOMY Left 09/17/2017   Procedure: BREAST EXCISION, SENTINEL NODE BIOPSY;  Surgeon: Earline Mayotte, MD;  Location: [facility] ORS;  Service: General;  Laterality: Left;   COLONOSCOPY  2015   COLONOSCOPY WITH PROPOFOL N/A 10/04/2019   Procedure: COLONOSCOPY WITH PROPOFOL;  Surgeon: Earline Mayotte, MD;  Location: [facility] ENDOSCOPY;  Service: Endoscopy;  Laterality: N/A;   DILATION AND CURETTAGE OF UTERUS     ETHMOIDECTOMY Bilateral 11/05/2016   Procedure: ETHMOIDECTOMY;  Surgeon: Christia Reading, MD;  Location: [facility] OR;  Service: ENT;  Laterality: Bilateral;   LAPAROTOMY     MAXILLARY ANTROSTOMY Bilateral 11/05/2016   Procedure: MAXILLARY ANTROSTOMY;  Surgeon: Christia Reading, MD;  Location: [facility] OR;  Service: ENT;  Laterality: Bilateral;   SINUS ENDO W/FUSION Bilateral 11/05/2016   Procedure: FRONTAL RECESS EXPLORATION;  Surgeon: Christia Reading, MD;  Location: [facility] OR;  Service: ENT;  Laterality: Bilateral;  BILATERAL ENDOSCOPIC SINUS SURGERY WITH FUSION   SINUS EXPLORATION  11/05/2016   SPHENOIDECTOMY Bilateral 11/05/2016   Procedure: SPHENOIDECTOMY;  Surgeon: Christia Reading, MD;  Location: [facility] OR;  Service: ENT;  Laterality: Bilateral;    OB History     Gravida  2   Para      Term      Preterm      AB      Living         SAB      IAB      Ectopic      Multiple      Live Births  2        Obstetric Comments  1st Menstrual Cycle:  15 1st Pregnancy: 31            Home Medications    Prior to Admission medications   Medication Sig Start Date End Date Taking? Authorizing Provider  albuterol (VENTOLIN HFA) 108 (90 Base) MCG/ACT inhaler Inhale 2 puffs into the lungs every 6 (six) hours as needed for wheezing or shortness of breath. 06/19/20  Yes Glori Luis, MD  ALPRAZolam Prudy Feeler) 0.25 MG tablet Take 1 tablet by mouth twice daily as  needed for anxiety 12/24/22  Yes Glori Luis, MD  celecoxib (CELEBREX) 100 MG capsule Take 1 capsule by mouth twice daily 12/24/22  Yes Glori Luis, MD  cetirizine (EQ ALLERGY RELIEF, CETIRIZINE,) 10 MG tablet Take 1 tablet (10 mg total) by mouth daily. 11/04/22  Yes Glori Luis, MD  EQ ARTHRITIS PAIN RELIEVER 1 % GEL APPLY 2 GRAMS TOPICALLY FOUR TIMES DAILY AS NEEDED 04/17/22  Yes Glori Luis, MD  FLUoxetine (PROZAC) 20 MG capsule Take 3 capsules (60 mg total) by mouth daily. 11/23/22  Yes Glori Luis, MD  fluticasone (FLONASE) 50 MCG/ACT nasal spray Place 1 spray into both nostrils daily. 07/02/21  Yes Glori Luis, MD  hydrochlorothiazide (HYDRODIURIL) 25 MG tablet Take 1 tablet (25 mg  total) by mouth daily. 11/23/22  Yes Glori Luis, MD  methocarbamol (ROBAXIN) 500 MG tablet TAKE 1 TABLET BY MOUTH EVERY 8 HOURS AS NEEDED FOR MUSCLE SPASM 09/17/22  Yes Magnant, Joycie Peek, PA-C  Multiple Vitamin (MULTI-VITAMIN) tablet Take 1 tablet by mouth daily.    Yes [provider]  Omega-3 Fatty Acids (FISH OIL PO) Take 1 tablet by mouth daily.   Yes [provider]  ondansetron (ZOFRAN) 4 MG tablet Take 1 tablet (4 mg total) by mouth every 8 (eight) hours as needed for nausea or vomiting. 07/09/22  Yes Cammy Copa, MD  pantoprazole (PROTONIX) 40 MG tablet Take 1 tablet (40 mg total) by mouth daily. 10/27/22  Yes Glori Luis, MD  rosuvastatin (CRESTOR) 40 MG tablet Take 1 tablet (40 mg total) by mouth daily. 07/22/22  Yes Glori Luis, MD  senna (SENOKOT) 8.6 MG TABS tablet Take 1 tablet by mouth at bedtime.    Yes [provider]  SUMAtriptan (IMITREX) 50 MG tablet Take 1 tablet (50 mg total) by mouth every 2 (two) hours as needed for migraine. May repeat in 2 hours if headache persists or recurs. No more than 2 doses in a 24 hour time frame. 12/23/22  Yes Glori Luis, MD  traMADol (ULTRAM) 50 MG tablet TAKE 1 TABLET BY  MOUTH EVERY 6 HOURS AS NEEDED 11/23/22  Yes Glori Luis, MD  tretinoin (RETIN-A) 0.05 % cream Apply 1 application topically at bedtime. 05/31/18  Yes Glori Luis, MD  valsartan (DIOVAN) 320 MG tablet Take 1 tablet (320 mg total) by mouth daily. 11/23/22  Yes Glori Luis, MD  buPROPion (WELLBUTRIN XL) 150 MG 24 hr tablet Take 1 tablet (150 mg total) by mouth daily. 11/23/22   Glori Luis, MD    Family History Family History  Problem Relation Age of Onset   Heart disease Mother    Hyperlipidemia Mother    Hypertension Mother    Hypertension Father    Osteoarthritis Father    Stroke Father    Alzheimer's disease Sister    Alzheimer's disease Brother    Brain cancer Brother    Healthy Daughter    Healthy Son    Breast cancer Neg Hx     Social History Social History   Tobacco Use   Smoking status: Never   Smokeless tobacco: Never  Vaping Use   Vaping status: Never Used  Substance Use Topics   Alcohol use: Not Currently    Comment: rarely   Drug use: No     Allergies   Lidocaine, Codeine, Morphine, Niacin, and Niacin and related   Review of Systems Review of Systems  Constitutional:  Positive for unexpected weight change.  Respiratory:  Positive for shortness of breath.   Cardiovascular:  Positive for palpitations and leg swelling. Negative for chest pain.  Neurological:  Positive for dizziness. Negative for syncope.     Physical Exam Triage Vital Signs ED Triage Vitals  Encounter Vitals Group     BP      Systolic BP Percentile      Diastolic BP Percentile      Pulse      Resp      Temp      Temp src      SpO2      Weight      Height      Head Circumference      Peak Flow  Pain Score      Pain Loc      Pain Education      Exclude from Growth Chart    No data found.  Updated Vital Signs BP (!) 141/86   Pulse 73   Temp 98.8 F (37.1 C)   Resp 20   SpO2 99%   Visual Acuity Right Eye Distance:   Left Eye Distance:    Bilateral Distance:    Right Eye Near:   Left Eye Near:    Bilateral Near:     Physical Exam Vitals and nursing note reviewed.  Constitutional:      Appearance: Normal appearance. She is not ill-appearing.  HENT:     Head: Normocephalic and atraumatic.  Cardiovascular:     Rate and Rhythm: Normal rate and regular rhythm.     Pulses: Normal pulses.     Heart sounds: Normal heart sounds. No murmur heard.    No friction rub. No gallop.  Pulmonary:     Effort: Pulmonary effort is normal.     Breath sounds: Normal breath sounds. No wheezing, rhonchi or rales.  Musculoskeletal:     Right lower leg: Edema present.     Left lower leg: Edema present.     Comments: She has 1+ pitting edema from ankles to knees.  Bilaterally.  Left is greater than right.  Skin:    General: Skin is warm and dry.     Capillary Refill: Capillary refill takes less than 2 seconds.  Neurological:     General: No focal deficit present.     Mental Status: She is alert and oriented to person, place, and time.      UC Treatments / Results  Labs (all labs ordered are listed, but only abnormal results are displayed) Labs Reviewed  CBC WITH DIFFERENTIAL/PLATELET  COMPREHENSIVE METABOLIC PANEL  TROPONIN I (HIGH SENSITIVITY)    EKG Sinus rhythm with first-degree AV block Ventricular rate 66 bpm PR interval 222 ms QRS duration 92 ms QT/QTc 444/465 ms Incomplete right bundle branch block.  Radiology DG Chest 2 View  Result Date: 12/25/2022 CLINICAL DATA:  Dyspnea on exertion, peripheral edema EXAM: CHEST - 2 VIEW COMPARISON:  07/08/2016 FINDINGS: Transverse diameter of heart is increased. There are no signs of pulmonary edema or focal pulmonary consolidation. There is no pleural effusion or pneumothorax. Degenerative changes are noted with bony spurs and left shoulder. IMPRESSION: Cardiomegaly. There are no signs of pulmonary edema or focal pulmonary consolidation. Electronically Signed   By: Ernie Avena M.D.   On: 12/25/2022 16:33    Procedures Procedures (including critical care time)  Medications Ordered in UC Medications - No data to display  Initial Impression / Assessment and Plan / UC Course  I have reviewed the triage vital signs and the nursing notes.  Pertinent labs & imaging results that were available during my care of the patient were reviewed by me and considered in my medical decision making (see chart for details).   Patient is a nontoxic-appearing 77 year old female who is hard of hearing and here with her daughter for evaluation of progressive weakness with intermittent palpitations and dizziness for the last week and a half.  She did suffer a ground-level fall a week and a half ago and was evaluated in the emergency department where she had a negative head CT.  She and her daughter both state that since that fall she has just had continuing weakness that is worse day by day.  This is  associated with some intermittent dizziness, leg swelling, and weight gain.  She denies chest pain or syncope.  No history of CHF.  She did recently have an increase in her valsartan due to her diastolic not coming into the goal.  Since this occurred she has not had any repeat blood work.  She was also recently placed on a Holter monitor which showed some supraventricular events.  The patient's daughter reports that she went through the patient's pillbox and she has not doubled up on any medications.  Per the daughter's report she is acting like she is oversedated but she is not taking any sedate of medication.  On exam patient has S1-S2 heart sounds with regular rate and rhythm and lung sounds that are clear to auscultation all fields.  She does have pitting edema at 1+ from ankles to knees bilaterally with the left being greater than the right.  Etiology of patient's weakness and dyspnea exertion is unclear though differentials include arrhythmia, electrolyte abnormality, or developing CHF.   I will check a CBC, CMP, troponin, chest x-ray, and EKG.  EKG shows first-degree AV block with an incomplete right bundle branch block.  These were not present on prior EKG from 11/04/2022.  Chest x-ray independently reviewed and evaluated by me.  Impression: Heart appears to be normal size.  Lung fields are well aerated with no evidence of effusion or infiltrate.  Radiology overread is pending.  I discussed with the patient that she has 2 changes on her EKG that were not present on 11/04/2022 to include the first-degree AV block and the incomplete right bundle branch block.  The cause of these conduction delays is unclear though I do think it partially explains her shortness of breath on exertion with intermittent dizziness.  I have therefore recommended that she go to the emergency department for a more thorough evaluation.  I do feel it is safe for her to travel via POV.  She has elected to go to [facility].   Final Clinical Impressions(s) / UC Diagnoses   Final diagnoses:  Dyspnea on exertion  Weakness  Incomplete right bundle branch block  First degree AV block     Discharge Instructions      Please go to the emergency department at [facility] to be evaluated for your ongoing symptoms and your EKG changes.     ED Prescriptions   None    PDMP not reviewed this encounter.   Becky Augusta, NP 12/25/22 [phone]

## 2022-12-25 NOTE — ED Triage Notes (Signed)
Pt has been weaker since fall 11/2 weeks ago. Pt was also started on a new BP med . Pt has felt weaker since starting the new BP med.

## 2022-12-26 ENCOUNTER — Emergency Department: Payer: PPO

## 2022-12-26 DIAGNOSIS — I700 Atherosclerosis of aorta: Secondary | ICD-10-CM | POA: Diagnosis not present

## 2022-12-26 DIAGNOSIS — R918 Other nonspecific abnormal finding of lung field: Secondary | ICD-10-CM | POA: Diagnosis not present

## 2022-12-26 DIAGNOSIS — Z0389 Encounter for observation for other suspected diseases and conditions ruled out: Secondary | ICD-10-CM | POA: Diagnosis not present

## 2022-12-26 DIAGNOSIS — N281 Cyst of kidney, acquired: Secondary | ICD-10-CM | POA: Diagnosis not present

## 2022-12-26 DIAGNOSIS — K449 Diaphragmatic hernia without obstruction or gangrene: Secondary | ICD-10-CM | POA: Diagnosis not present

## 2022-12-26 LAB — D-DIMER, QUANTITATIVE: D-Dimer, Quant: 0.63 ug/mL-FEU — ABNORMAL HIGH (ref 0.00–0.50)

## 2022-12-26 MED ORDER — ALBUTEROL SULFATE HFA 108 (90 BASE) MCG/ACT IN AERS: 2.0000 | INHALATION_SPRAY | Freq: Four times a day (QID) | RESPIRATORY_TRACT | 2 refills | Status: AC | PRN

## 2022-12-26 MED ORDER — IOHEXOL 350 MG/ML SOLN
75.0000 mL | Freq: Once | INTRAVENOUS | Status: AC | PRN
  Administered 2022-12-26: 75 mL via INTRAVENOUS

## 2022-12-26 NOTE — ED Provider Notes (Signed)
-----------------------------------------   12:04 AM on 12/26/2022 -----------------------------------------   Care assumed of patient whose D-dimer is positive; CTA chest ordered.  Will also obtain Doppler ultrasound BLE for leg swelling.  Patient and daughter updated.   ----------------------------------------- 1:59 AM on 12/26/2022 -----------------------------------------   Updated patient on negative Doppler ultrasounds and CTA chest.  She has an appointment with her PCP on Tuesday.  I did suggest she have repeat Doppler ultrasounds in 3 days.  Strict return precautions given.  Patient verbalizes understanding and agrees with plan of care.   Irean Hong, MD 12/26/22 [phone]

## 2022-12-27 DIAGNOSIS — G4733 Obstructive sleep apnea (adult) (pediatric): Secondary | ICD-10-CM | POA: Diagnosis not present

## 2022-12-28 ENCOUNTER — Other Ambulatory Visit: Payer: Self-pay | Admitting: Family Medicine

## 2022-12-28 ENCOUNTER — Ambulatory Visit: Payer: PPO | Admitting: Nurse Practitioner

## 2022-12-29 ENCOUNTER — Encounter: Payer: Self-pay | Admitting: Nurse Practitioner

## 2022-12-29 ENCOUNTER — Ambulatory Visit (INDEPENDENT_AMBULATORY_CARE_PROVIDER_SITE_OTHER): Payer: PPO | Admitting: Nurse Practitioner

## 2022-12-29 ENCOUNTER — Other Ambulatory Visit: Payer: Self-pay

## 2022-12-29 ENCOUNTER — Encounter: Payer: Self-pay | Admitting: Pharmacist

## 2022-12-29 ENCOUNTER — Other Ambulatory Visit: Payer: Self-pay | Admitting: Family Medicine

## 2022-12-29 VITALS — BP 130/72 | HR 74 | Temp 98.0°F | Ht 64.0 in | Wt 207.0 lb

## 2022-12-29 DIAGNOSIS — R0602 Shortness of breath: Secondary | ICD-10-CM | POA: Diagnosis not present

## 2022-12-29 DIAGNOSIS — R7989 Other specified abnormal findings of blood chemistry: Secondary | ICD-10-CM | POA: Diagnosis not present

## 2022-12-29 DIAGNOSIS — E876 Hypokalemia: Secondary | ICD-10-CM | POA: Diagnosis not present

## 2022-12-29 MED ORDER — ALPRAZOLAM 0.25 MG PO TABS: 0.2500 mg | ORAL_TABLET | Freq: Two times a day (BID) | ORAL | 0 refills | Status: DC | PRN

## 2022-12-29 NOTE — Progress Notes (Signed)
Established Patient Office Visit  Subjective:  Patient ID: Dana Obrien, female    DOB: May 24, 1946  Age: 77 y.o. MRN: [id number]  CC:  Chief Complaint  Patient presents with   Follow-up    Lightheaded sob chest tightness still  since leaving ED    HPI  Dana Obrien presents for shortness of breath shortness of breath, chest tightness, lightheadedness we have left the ED.  Patient is accompanied by her daughter the history is provided by the patient and her daughter.  Patient states that she has shortness of breath if she moves from bedroom to bathroom.  The CT of the chest is negative for PE or acute cardiopulmonary disease. Likely benign bilateral noncalcified lung nodule, small hiatal hernia and aortic lateral sclerosis.  She has positive D-dimer but negative DVT and has to repeat the ultrasound for DVT.  She has not appointment scheduled with pulmonology and cardiology in October.  Patient and patient daughter is requesting if we can try to schedule the specialist appointment earlier than October. 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   Cancer [facility]) 2004   right lumpectomy, chemotherapy and radiation Dr. Koleen Nimrod   CPAP (continuous positive airway pressure) dependence    Depression    GERD (gastroesophageal reflux disease)    Hypertension    Migraine    Personal history of chemotherapy 2004   Personal history of radiation therapy 2004   Personal history of radiation therapy 2019   PONV (postoperative nausea and vomiting)    Shingles    Sleep apnea    uses CPAP, severe OSA   Vitamin D deficiency    IN THE PAST    Past Surgical History:  Procedure Laterality Date   ABDOMINAL HYSTERECTOMY      ANTERIOR AND POSTERIOR REPAIR     BREAST BIOPSY Right 2004   stereo. 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  3.5 cm maximum diameter, minimal margins 5 mm.  In situ component less than 10%.   BREAST LUMPECTOMY Right 2004   BREAST LUMPECTOMY Left 09/17/2017   Procedure: BREAST EXCISION, SENTINEL NODE BIOPSY;  Surgeon: Earline Mayotte, MD;  Location: [facility] ORS;  Service: General;  Laterality: Left;   COLONOSCOPY  2015   COLONOSCOPY WITH PROPOFOL N/A 10/04/2019   Procedure: COLONOSCOPY WITH PROPOFOL;  Surgeon: Earline Mayotte, MD;  Location: [facility] ENDOSCOPY;  Service: Endoscopy;  Laterality: N/A;   DILATION AND CURETTAGE OF UTERUS     ETHMOIDECTOMY Bilateral 11/05/2016   Procedure: ETHMOIDECTOMY;  Surgeon: Christia Reading, MD;  Location: [facility] OR;  Service: ENT;  Laterality: Bilateral;   LAPAROTOMY     MAXILLARY ANTROSTOMY Bilateral 11/05/2016   Procedure: MAXILLARY ANTROSTOMY;  Surgeon: Christia Reading, MD;  Location: [facility] OR;  Service: ENT;  Laterality: Bilateral;   SINUS ENDO W/FUSION Bilateral 11/05/2016   Procedure: FRONTAL RECESS EXPLORATION;  Surgeon: Christia Reading, MD;  Location: [facility] OR;  Service: ENT;  Laterality: Bilateral;  BILATERAL ENDOSCOPIC SINUS SURGERY WITH FUSION   SINUS  EXPLORATION  11/05/2016   SPHENOIDECTOMY Bilateral 11/05/2016   Procedure: SPHENOIDECTOMY;  Surgeon: Christia Reading, MD;  Location: [facility] OR;  Service: ENT;  Laterality: Bilateral;    Family History  Problem Relation Age of Onset   Heart disease Mother    Hyperlipidemia Mother    Hypertension Mother    Hypertension Father    Osteoarthritis Father    Stroke Father    Alzheimer's disease Sister    Alzheimer's disease Brother    Brain cancer Brother    Healthy Daughter    Healthy Son    Breast cancer Neg Hx     Social History    Socioeconomic History   Marital status: Widowed    Spouse name: Not on file   Number of children: Not on file   Years of education: Not on file   Highest education level: Associate degree: occupational, Scientist, product/process development, or vocational program  Occupational History   Not on file  Tobacco Use   Smoking status: Never   Smokeless tobacco: Never  Vaping Use   Vaping status: Never Used  Substance and Sexual Activity   Alcohol use: Not Currently    Comment: rarely   Drug use: No   Sexual activity: Not Currently  Other Topics Concern   Not on file  Social History Narrative   Daily Caffeine Use:  1-2 coffee   Regular Exercise -  NO   Social Determinants of Health   Financial Resource Strain: Medium Risk (11/03/2022)   Overall Financial Resource Strain (CARDIA)    Difficulty of Paying Living Expenses: Somewhat hard  Food Insecurity: No Food Insecurity (11/03/2022)   Hunger Vital Sign    Worried About Running Out of Food in the Last Year: Never true    Ran Out of Food in the Last Year: Never true  Transportation Needs: No Transportation Needs (11/03/2022)   PRAPARE - Administrator, Civil Service (Medical): No    Lack of Transportation (Non-Medical): No  Physical Activity: Inactive (11/03/2022)   Exercise Vital Sign    Days of Exercise per Week: 0 days    Minutes of Exercise per Session: 20 min  Stress: Stress Concern Present (11/03/2022)   [employer] of Occupational Health - Occupational Stress Questionnaire    Feeling of Stress : To some extent  Social Connections: Moderately Integrated (11/03/2022)   Social Connection and Isolation Panel [NHANES]    Frequency of Communication with Friends and Family: Three times a week    Frequency of Social Gatherings with Friends and Family: Once a week    Attends Religious Services: More than 4 times per year    Active Member of [employer] or Organizations: Yes    Attends Banker Meetings: More than 4 times per year    Marital  Status: Widowed  Intimate Partner Violence: Not At Risk (09/09/2022)   Humiliation, Afraid, Rape, and Kick questionnaire    Fear of Current or Ex-Partner: No    Emotionally Abused: No    Physically Abused: No    Sexually Abused: No     Outpatient Medications Prior to Visit  Medication Sig Dispense Refill   albuterol (VENTOLIN HFA) 108 (90 Base) MCG/ACT inhaler Inhale 2 puffs into the lungs every 6 (six) hours as needed for wheezing or shortness of breath. 8 g 2   celecoxib (CELEBREX) 100 MG capsule Take 1 capsule by mouth twice daily 60 capsule 0   cetirizine (EQ ALLERGY RELIEF, CETIRIZINE,) 10 MG tablet Take 1 tablet (10  mg total) by mouth daily. 90 tablet 0   EQ ARTHRITIS PAIN RELIEVER 1 % GEL APPLY 2 GRAMS TOPICALLY FOUR TIMES DAILY AS NEEDED 50 g 0   FLUoxetine (PROZAC) 20 MG capsule Take 3 capsules (60 mg total) by mouth daily. 90 capsule 3   fluticasone (FLONASE) 50 MCG/ACT nasal spray Place 1 spray into both nostrils daily. 16 g 0   hydrochlorothiazide (HYDRODIURIL) 25 MG tablet Take 1 tablet (25 mg total) by mouth daily. 90 tablet 3   Multiple Vitamin (MULTI-VITAMIN) tablet Take 1 tablet by mouth daily.      Omega-3 Fatty Acids (FISH OIL PO) Take 1 tablet by mouth daily.     ondansetron (ZOFRAN) 4 MG tablet Take 1 tablet (4 mg total) by mouth every 8 (eight) hours as needed for nausea or vomiting. 20 tablet 0   pantoprazole (PROTONIX) 40 MG tablet Take 1 tablet (40 mg total) by mouth daily. 90 tablet 0   potassium chloride (KLOR-CON) 20 MEQ packet Take 20 mEq by mouth daily for 7 days. 7 packet 1   rosuvastatin (CRESTOR) 40 MG tablet Take 1 tablet (40 mg total) by mouth daily. 90 tablet 3   senna (SENOKOT) 8.6 MG TABS tablet Take 1 tablet by mouth at bedtime.      SUMAtriptan (IMITREX) 50 MG tablet Take 1 tablet (50 mg total) by mouth every 2 (two) hours as needed for migraine. May repeat in 2 hours if headache persists or recurs. No more than 2 doses in a 24 hour time frame. 10  tablet 0   traMADol (ULTRAM) 50 MG tablet TAKE 1 TABLET BY MOUTH EVERY 6 HOURS AS NEEDED 30 tablet 0   tretinoin (RETIN-A) 0.05 % cream Apply 1 application topically at bedtime. (Patient not taking: Reported on 12/25/2022) 45 g 0   valsartan (DIOVAN) 320 MG tablet Take 1 tablet (320 mg total) by mouth daily. 90 tablet 3   ALPRAZolam (XANAX) 0.25 MG tablet Take 1 tablet by mouth twice daily as needed for anxiety 20 tablet 0   furosemide (LASIX) 20 MG tablet Take 1 tablet (20 mg total) by mouth daily. 5 tablet 1   methocarbamol (ROBAXIN) 500 MG tablet TAKE 1 TABLET BY MOUTH EVERY 8 HOURS AS NEEDED FOR MUSCLE SPASM 30 tablet 0   No facility-administered medications prior to visit.    Allergies  Allergen Reactions   Lidocaine Palpitations   Codeine Nausea Only and Nausea And Vomiting   Morphine Nausea Only   Niacin Rash   Niacin And Related Rash    ANTIHYPERLIPEMICS    ROS Review of Systems Negative unless indicated in HPI.    Objective:    Physical Exam Constitutional:      Appearance: Normal appearance.  Cardiovascular:     Rate and Rhythm: Normal rate and regular rhythm.     Pulses: Normal pulses.     Heart sounds: Normal heart sounds.  Abdominal:     General: Bowel sounds are normal.     Palpations: Abdomen is soft.     Tenderness: There is no abdominal tenderness. There is no right CVA tenderness or left CVA tenderness.  Musculoskeletal:     Cervical back: Normal range of motion.     Right lower leg: No edema.     Left lower leg: No edema.  Skin:    General: Skin is warm.  Neurological:     General: No focal deficit present.     Mental Status: She is alert. Mental status is  at baseline.  Psychiatric:        Mood and Affect: Mood normal.        Behavior: Behavior normal.        Thought Content: Thought content normal.        Judgment: Judgment normal.     BP 130/72   Pulse 74   Temp 98 F (36.7 C) (Oral)   Ht 5\' 4"  (1.626 m)   Wt 207 lb (93.9 kg)   SpO2  99%   BMI 35.53 kg/m  Wt Readings from Last 3 Encounters:  12/29/22 207 lb (93.9 kg)  12/25/22 200 lb (90.7 kg)  12/17/22 190 lb (86.2 kg)     Health Maintenance  Topic Date Due   Zoster Vaccines- Shingrix (1 of 2) Never done   COVID-19 Vaccine (3 - Pfizer risk series) 07/29/2019   INFLUENZA VACCINE  12/31/2022   Medicare Annual Wellness (AWV)  03/13/2023   Colonoscopy  10/03/2024   DTaP/Tdap/Td (3 - Td or Tdap) 12/16/2032   Pneumonia Vaccine 59+ Years old  Completed   DEXA SCAN  Completed   Hepatitis C Screening  Completed   HPV VACCINES  Aged Out    There are no preventive care reminders to display for this patient.  Lab Results  Component Value Date   TSH 2.388 12/25/2022   Lab Results  Component Value Date   WBC 8.0 12/25/2022   HGB 11.3 (L) 12/25/2022   HCT 34.3 (L) 12/25/2022   MCV 94.8 12/25/2022   PLT 218 12/25/2022   Lab Results  Component Value Date   NA 141 12/29/2022   K 4.1 12/29/2022   CO2 31 12/29/2022   GLUCOSE 86 12/29/2022   BUN 17 12/29/2022   CREATININE 0.78 12/29/2022   BILITOT 0.6 12/25/2022   ALKPHOS 55 12/25/2022   AST 22 12/25/2022   ALT 17 12/25/2022   PROT 7.4 12/25/2022   ALBUMIN 4.0 12/25/2022   CALCIUM 10.0 12/29/2022   ANIONGAP 8 12/25/2022   GFR 74.32 12/29/2022   Lab Results  Component Value Date   CHOL 174 07/21/2022   Lab Results  Component Value Date   HDL 76.00 07/21/2022   Lab Results  Component Value Date   LDLCALC 71 07/21/2022   Lab Results  Component Value Date   TRIG 135.0 07/21/2022   Lab Results  Component Value Date   CHOLHDL 2 07/21/2022   Lab Results  Component Value Date   HGBA1C 5.9 07/21/2022      Assessment & Plan:  Hypokalemia Assessment & Plan: Her potassium 3.2 on 12/25/2022. Will recheck potassium. Advised patient to increase food containing potassium like orange juice, dry fruits, spinach, broccoli, beans, potatoes, cantaloupe, avocado, coconut water and  salmon.   Orders: -     Basic metabolic panel  Positive D dimer Assessment & Plan: Repeat D-dimer. US DVT ordered as mentioned in discharge summary of ED.  Orders: -     D-dimer, quantitative -     US Venous Img Lower Bilateral (DVT); Future  Exertional shortness of breath Assessment & Plan: No leg swelling and vitals reassuring. Patient walked with the nurse in the hallway and the oxygen saturation remain steady at 96%while she walked. She have follow-up with pulmonology on August 15.   Other orders -     ALPRAZolam; Take 1 tablet (0.25 mg total) by mouth 2 (two) times daily as needed. for anxiety  Dispense: 20 tablet; Refill: 0    Follow-up: Return if symptoms worsen or  fail to improve.   Kara Dies, NP

## 2022-12-29 NOTE — Progress Notes (Signed)
Pharmacy Quality Measure Review  This patient is appearing on a report for being at risk of failing the adherence measure for cholesterol (statin) medications this calendar year.   Medication: rosuvastatin Last fill date: 7/23 for 90 day supply  Insurance report was not up to date. No action needed at this time.   Catie Eppie Gibson, PharmD, BCACP, CPP Clinical Pharmacist [facility] [phone]

## 2022-12-30 ENCOUNTER — Telehealth: Payer: Self-pay | Admitting: Family Medicine

## 2022-12-30 ENCOUNTER — Ambulatory Visit: Payer: PPO | Admitting: Nurse Practitioner

## 2022-12-30 ENCOUNTER — Encounter (INDEPENDENT_AMBULATORY_CARE_PROVIDER_SITE_OTHER): Payer: Self-pay

## 2022-12-30 NOTE — Telephone Encounter (Signed)
Prescription Request  12/30/2022  LOV: 11/04/2022  What is the name of the medication or equipment? furosemide (LASIX) 20 MG tablet. Patient was seen by Evelene Croon on 7/302024 ,and she was going to fill.   Have you contacted your pharmacy to request a refill? Yes   Which pharmacy would you like this sent to?  [facility] [address], [place] - [address] [place] [address] Phone: [phone] Fax: [phone]    Patient notified that their request is being sent to the clinical staff for review and that they should receive a response within 2 business days.   Please advise at [facility] [phone]

## 2022-12-30 NOTE — Telephone Encounter (Signed)
This medication was filled by another office previously & only 5 tablets were sent in with refills.  Please advise

## 2022-12-31 ENCOUNTER — Ambulatory Visit
Admission: RE | Admit: 2022-12-31 | Discharge: 2022-12-31 | Disposition: A | Discharge status: Home / Self Care | Payer: PPO | Source: Ambulatory Visit | Attending: Nurse Practitioner | Admitting: Nurse Practitioner

## 2022-12-31 ENCOUNTER — Telehealth: Payer: Self-pay

## 2022-12-31 ENCOUNTER — Other Ambulatory Visit: Payer: Self-pay | Admitting: Surgical

## 2022-12-31 ENCOUNTER — Encounter: Payer: Self-pay | Admitting: Nurse Practitioner

## 2022-12-31 ENCOUNTER — Ambulatory Visit: Admission: RE | Admit: 2022-12-31 | Payer: PPO | Source: Ambulatory Visit

## 2022-12-31 DIAGNOSIS — R7989 Other specified abnormal findings of blood chemistry: Secondary | ICD-10-CM | POA: Insufficient documentation

## 2022-12-31 MED ORDER — FUROSEMIDE 20 MG PO TABS: 20.0000 mg | ORAL_TABLET | Freq: Every day | ORAL | 1 refills | Status: DC

## 2022-12-31 NOTE — Telephone Encounter (Signed)
Transition Care Management Follow-up Telephone Call Date of discharge and from where: 12/26/2022 [facility]  How have you been since you were released from the hospital? Patient is having an ultrasound request that I call her again 01/01/2023.  Suresh Audi [facility]  [facility] Population Health Community Resource Care Guide   [email or web address]  ?? [phone]   Website: [email or web address]  [email or web address]

## 2022-12-31 NOTE — Telephone Encounter (Signed)
Noted! Thank you

## 2022-12-31 NOTE — Addendum Note (Signed)
Addended by: Benedict Needy on: 12/31/2022 12:13 PM   Modules accepted: Orders

## 2022-12-31 NOTE — Telephone Encounter (Signed)
Spoke with Kara Dies, NP; verbal order given to refill lasix 20mg  30 tablets with 1 refill.  Sent to [facility].

## 2023-01-01 ENCOUNTER — Telehealth: Payer: Self-pay

## 2023-01-01 NOTE — Telephone Encounter (Signed)
Transition Care Management Unsuccessful Follow-up Telephone Call  Date of discharge and from where:  12/26/2022 [facility]  Attempts:  2nd Attempt  Reason for unsuccessful TCM follow-up call:  Left voice message   [facility]  [facility] Population Health Community Resource Care Guide   [email or web address]  ?? [phone]   Website: [email or web address]  [email or web address]

## 2023-01-03 DIAGNOSIS — R7989 Other specified abnormal findings of blood chemistry: Secondary | ICD-10-CM | POA: Insufficient documentation

## 2023-01-03 DIAGNOSIS — E876 Hypokalemia: Secondary | ICD-10-CM | POA: Insufficient documentation

## 2023-01-03 NOTE — Assessment & Plan Note (Signed)
Her potassium 3.2 on 12/25/2022. Will recheck potassium. Advised patient to increase food containing potassium like orange juice, dry fruits, spinach, broccoli, beans, potatoes, cantaloupe, avocado, coconut water and salmon.

## 2023-01-03 NOTE — Assessment & Plan Note (Signed)
No leg swelling and vitals reassuring. Patient walked with the nurse in the hallway and the oxygen saturation remain steady at 96%while she walked. She have follow-up with pulmonology on August 15.

## 2023-01-03 NOTE — Assessment & Plan Note (Signed)
Repeat D-dimer. US DVT ordered as mentioned in discharge summary of ED.

## 2023-01-04 ENCOUNTER — Ambulatory Visit: Payer: PPO | Admitting: Surgical

## 2023-01-12 ENCOUNTER — Encounter: Payer: Self-pay | Admitting: Cardiovascular Disease

## 2023-01-12 ENCOUNTER — Ambulatory Visit: Payer: PPO | Attending: Cardiology | Admitting: Cardiovascular Disease

## 2023-01-12 ENCOUNTER — Telehealth: Payer: Self-pay

## 2023-01-12 VITALS — BP 156/90 | HR 71 | Ht 64.0 in | Wt 214.6 lb

## 2023-01-12 DIAGNOSIS — R072 Precordial pain: Secondary | ICD-10-CM

## 2023-01-12 DIAGNOSIS — R0602 Shortness of breath: Secondary | ICD-10-CM | POA: Diagnosis not present

## 2023-01-12 DIAGNOSIS — R600 Localized edema: Secondary | ICD-10-CM

## 2023-01-12 DIAGNOSIS — R002 Palpitations: Secondary | ICD-10-CM | POA: Diagnosis not present

## 2023-01-12 DIAGNOSIS — I209 Angina pectoris, unspecified: Secondary | ICD-10-CM | POA: Diagnosis not present

## 2023-01-12 DIAGNOSIS — R079 Chest pain, unspecified: Secondary | ICD-10-CM | POA: Diagnosis not present

## 2023-01-12 MED ORDER — FUROSEMIDE 20 MG PO TABS: 20.0000 mg | ORAL_TABLET | Freq: Every day | ORAL | 1 refills | Status: DC | PRN

## 2023-01-12 MED ORDER — METOPROLOL TARTRATE 100 MG PO TABS: 100.0000 mg | ORAL_TABLET | Freq: Once | ORAL | 0 refills | Status: DC

## 2023-01-12 MED ORDER — HYDROCHLOROTHIAZIDE 25 MG PO TABS: 25.0000 mg | ORAL_TABLET | Freq: Every day | ORAL | 3 refills | Status: DC

## 2023-01-12 NOTE — Progress Notes (Addendum)
Cardiology Office Note  Date:  01/12/2023   ID:  Dana Obrien, Dana Obrien 1946/03/22, MRN [id number]  PCP:  Glori Luis, MD   Chief Complaint  Patient presents with   New Patient (Initial Visit)    Cardiac hx with [facility] clinic. Palpitations, resistant HTN, SOB, lower extremity edema, last EKG "said possible MI/AV block",     HPI:  Dana Obrien is a 77 year old woman with past medical history of Anxiety Hypertension Hyperlipidemia Shortness of breath Sleep apnea on cpap, now bipap obesity Prior workup for chest pain at [facility] Who presents for chest pain, palpitations, shortness of breath, leg edema  Recently seen in the emergency room July 2024 for shortness of breath and chest pain, weakness Presented by urgent care for abnormal EKG Blood pressure was elevated 177/88 Troponin and cardiac workup negative Cardiac CTA nonacute,  Periodic SOB and chest pain, palpitations Edema, started on lasix  Not urinating, prefers hydrochlorothiazide over lasix  "Stress eater", weight higher High sodium, eats out, soda Arthritis pain, knees, back  Echocardiogram August 2017 NORMAL LEFT VENTRICULAR SYSTOLIC FUNCTION  NORMAL RIGHT VENTRICULAR SYSTOLIC FUNCTION  MILD VALVULAR REGURGITATION (See above)  NO VALVULAR STENOSIS  EF=55%   Myoview August 2017 Normal study  BP at home: 140/75  EKG personally reviewed by myself on todays visit EKG Interpretation Date/Time:  Tuesday January 12 2023 10:57:56 EDT Ventricular Rate:  71 PR Interval:  210 QRS Duration:  96 QT Interval:  404 QTC Calculation: 439 R Axis:   -10  Text Interpretation: Sinus rhythm with 1st degree A-V block When compared with ECG of 25-Dec-2022 18:43, No significant change was found Confirmed by Julien Nordmann [phone]) on 01/12/2023 11:26:46 AM    PMH:   has a past medical history of Anxiety, Arthritis, Asthma, Breast cancer (HCC) (2004), Breast cancer (HCC) (09/08/2017), Breast cancer of  upper-outer quadrant of left female breast (HCC) (09/17/2017), Cancer (HCC) (2004), CPAP (continuous positive airway pressure) dependence, Depression, GERD (gastroesophageal reflux disease), Hypertension, Migraine, Personal history of chemotherapy (2004), Personal history of radiation therapy (2004), Personal history of radiation therapy (2019), PONV (postoperative nausea and vomiting), Shingles, Sleep apnea, and Vitamin D deficiency.  PSH:    Past Surgical History:  Procedure Laterality Date   ABDOMINAL HYSTERECTOMY     ANTERIOR AND POSTERIOR REPAIR     BREAST BIOPSY Right 2004   stereo. 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  3.5 cm maximum diameter, minimal margins 5 mm.  In situ component less than 10%.   BREAST LUMPECTOMY Right 2004   BREAST LUMPECTOMY Left 09/17/2017   Procedure: BREAST EXCISION, SENTINEL NODE BIOPSY;  Surgeon: Earline Mayotte, MD;  Location: [facility] ORS;  Service: General;  Laterality: Left;   COLONOSCOPY  2015   COLONOSCOPY WITH PROPOFOL N/A 10/04/2019   Procedure: COLONOSCOPY WITH PROPOFOL;  Surgeon: Earline Mayotte, MD;  Location: [facility] ENDOSCOPY;  Service: Endoscopy;  Laterality: N/A;   DILATION AND CURETTAGE OF UTERUS     ETHMOIDECTOMY Bilateral 11/05/2016   Procedure: ETHMOIDECTOMY;  Surgeon: Christia Reading, MD;  Location: [facility] OR;  Service: ENT;  Laterality: Bilateral;   LAPAROTOMY     MAXILLARY ANTROSTOMY Bilateral 11/05/2016   Procedure: MAXILLARY ANTROSTOMY;  Surgeon: Christia Reading, MD;  Location: [facility] OR;  Service: ENT;  Laterality: Bilateral;   SINUS ENDO W/FUSION Bilateral 11/05/2016   Procedure:  FRONTAL RECESS EXPLORATION;  Surgeon: Christia Reading, MD;  Location: [facility] OR;  Service: ENT;  Laterality: Bilateral;  BILATERAL ENDOSCOPIC SINUS SURGERY WITH FUSION   SINUS EXPLORATION   11/05/2016   SPHENOIDECTOMY Bilateral 11/05/2016   Procedure: SPHENOIDECTOMY;  Surgeon: Christia Reading, MD;  Location: [facility] OR;  Service: ENT;  Laterality: Bilateral;    Current Outpatient Medications  Medication Sig Dispense Refill   albuterol (VENTOLIN HFA) 108 (90 Base) MCG/ACT inhaler Inhale 2 puffs into the lungs every 6 (six) hours as needed for wheezing or shortness of breath. 8 g 2   ALPRAZolam (XANAX) 0.25 MG tablet Take 1 tablet (0.25 mg total) by mouth 2 (two) times daily as needed. for anxiety 20 tablet 0   celecoxib (CELEBREX) 100 MG capsule Take 1 capsule by mouth twice daily 60 capsule 0   cetirizine (EQ ALLERGY RELIEF, CETIRIZINE,) 10 MG tablet Take 1 tablet (10 mg total) by mouth daily. 90 tablet 0   EQ ARTHRITIS PAIN RELIEVER 1 % GEL APPLY 2 GRAMS TOPICALLY FOUR TIMES DAILY AS NEEDED 50 g 0   FLUoxetine (PROZAC) 20 MG capsule Take 3 capsules (60 mg total) by mouth daily. 90 capsule 3   fluticasone (FLONASE) 50 MCG/ACT nasal spray Place 1 spray into both nostrils daily. 16 g 0   hydrochlorothiazide (HYDRODIURIL) 25 MG tablet Take 1 tablet (25 mg total) by mouth daily. 90 tablet 3   methocarbamol (ROBAXIN) 500 MG tablet TAKE 1 TABLET BY MOUTH EVERY 8 HOURS AS NEEDED FOR MUSCLE SPASM 30 tablet 0   metoprolol tartrate (LOPRESSOR) 100 MG tablet Take 1 tablet (100 mg total) by mouth once for 1 dose. 1 tablet 0   Multiple Vitamin (MULTI-VITAMIN) tablet Take 1 tablet by mouth daily.      Omega-3 Fatty Acids (FISH OIL PO) Take 1 tablet by mouth daily.     ondansetron (ZOFRAN) 4 MG tablet Take 1 tablet (4 mg total) by mouth every 8 (eight) hours as needed for nausea or vomiting. 20 tablet 0   pantoprazole (PROTONIX) 40 MG tablet Take 1 tablet (40 mg total) by mouth daily. 90 tablet 0   rosuvastatin (CRESTOR) 40 MG tablet Take 1 tablet (40 mg total) by mouth daily. 90 tablet 3   senna (SENOKOT) 8.6 MG TABS tablet Take 1 tablet by mouth at bedtime.      SUMAtriptan (IMITREX) 50 MG tablet Take  1 tablet (50 mg total) by mouth every 2 (two) hours as needed for migraine. May repeat in 2 hours if headache persists or recurs. No more than 2 doses in a 24 hour time frame. 10 tablet 0   traMADol (ULTRAM) 50 MG tablet TAKE 1 TABLET BY MOUTH EVERY 6 HOURS AS NEEDED 30 tablet 0   tretinoin (RETIN-A) 0.05 % cream Apply 1 application topically at bedtime. 45 g 0   valsartan (DIOVAN) 320 MG tablet Take 1 tablet (320 mg total) by mouth daily. 90 tablet 3   furosemide (LASIX) 20 MG tablet Take 1 tablet (20 mg total) by mouth daily as needed. For ankle swelling. 30 tablet 1   No current facility-administered medications for this visit.   Allergies:   Lidocaine, Codeine, Morphine, Niacin, and Niacin and related   Social History:  The patient  reports that she has never smoked. She has never used smokeless tobacco. She reports that she does not currently use alcohol. She reports that she does not use drugs.   Family History:   family history includes Alzheimer's disease in her  brother and sister; Brain cancer in her brother; Healthy in her daughter and son; Heart disease in her mother; Heart failure in her mother; Heart murmur in her mother; Hyperlipidemia in her mother; Hypertension in her father and mother; Osteoarthritis in her father; Stroke in her father.    Review of Systems: Review of Systems  Constitutional: Negative.   HENT: Negative.    Respiratory:  Positive for shortness of breath.   Cardiovascular:  Positive for chest pain.  Gastrointestinal: Negative.   Musculoskeletal: Negative.   Neurological: Negative.   Psychiatric/Behavioral: Negative.    All other systems reviewed and are negative.   PHYSICAL EXAM: VS:  BP (!) 156/90 (BP Location: Right Arm, Patient Position: Sitting, Cuff Size: Large)   Pulse 71   Ht 5\' 4"  (1.626 m)   Wt 214 lb 9.6 oz (97.3 kg)   SpO2 97%   BMI 36.84 kg/m  , BMI Body mass index is 36.84 kg/m. GEN: Well nourished, well developed, in no acute  distress HEENT: normal Neck: no JVD, carotid bruits, or masses Cardiac: RRR; no murmurs, rubs, or gallops, Trace edema b/l Respiratory:  clear to auscultation bilaterally, normal work of breathing GI: soft, nontender, nondistended, + BS MS: no deformity or atrophy Skin: warm and dry, no rash Neuro:  Strength and sensation are intact Psych: euthymic mood, full affect  Recent Labs: 12/25/2022: ALT 17; B Natriuretic Peptide 108.9; Hemoglobin 11.3; Platelets 218; TSH 2.388 12/29/2022: BUN 17; Creatinine, Ser 0.78; Potassium 4.1; Sodium 141    Lipid Panel Lab Results  Component Value Date   CHOL 174 07/21/2022   HDL 76.00 07/21/2022   LDLCALC 71 07/21/2022   TRIG 135.0 07/21/2022      Wt Readings from Last 3 Encounters:  01/12/23 214 lb 9.6 oz (97.3 kg)  12/29/22 207 lb (93.9 kg)  12/25/22 200 lb (90.7 kg)       ASSESSMENT AND PLAN:  Problem List Items Addressed This Visit     Exertional shortness of breath   Relevant Orders   ECHOCARDIOGRAM COMPLETE   Palpitations   Relevant Orders   EKG 12-Lead (Completed)   Other Visit Diagnoses     Chest pain of uncertain etiology    -  Primary   Precordial pain       Relevant Orders   CT CORONARY MORPH W/CTA COR W/SCORE W/CA W/CM &/OR WO/CM   Angina pectoris (HCC)       Relevant Medications   furosemide (LASIX) 20 MG tablet   hydrochlorothiazide (HYDRODIURIL) 25 MG tablet   metoprolol tartrate (LOPRESSOR) 100 MG tablet   Leg edema          Chest pain/angina Recently seen in the emergency room for similar symptoms, workup negative at that time Unable to treadmill Recommended cardiac CTA for further evaluation  Shortness of breath High fluid and salt intake, weight running high, deconditioning likely a component Further cardiac workup with echocardiogram to rule out structural heart disease, valve disorder, pulmonary hypertension She prefers to change back to HCTZ over Lasix New prescription provided for HCTZ 25 daily  with Lasix as needed for worsening leg swelling  Essential hypertension Recommend close monitoring of blood pressure at home Restart HCTZ 25 daily, Lasix as needed Continue valsartan 360 mg daily for now Family to help monitor blood pressure at home Would avoid calcium channel blockers given lower extremity edema Additional options for blood pressure includes hydralazine, clonidine, Cardura, long-acting nitrates, beta-blockers  Leg swelling Possibly multifactorial, has underlying venous insufficiency, possibly  exacerbated by obesity, unable to exclude component of fluid retention Restart HCTZ at her request over Lasix Lasix as needed for worsening ankle swelling Discussed need to moderate soda and salt intake  Total encounter time more than 60 minutes Greater than 50% was spent in counseling and coordination of care with the patient    Signed, Dossie Arbour, M.D., Ph.D. [facility] [place], [place] [phone]

## 2023-01-12 NOTE — Patient Instructions (Addendum)
Medication Instructions:  Hydrochlorothiazide 25 mg daily Lasix 20 mg daily prn for ankle swelling  Monitor blood pressure  If you need a refill on your cardiac medications before your next appointment, please call your pharmacy.   Lab work: No new labs needed  Testing/Procedures: Cardiac CTA for angina Echo for shortness of breath Your physician has requested that you have an echocardiogram. Echocardiography is a painless test that uses sound waves to create images of your heart. It provides your doctor with information about the size and shape of your heart and how well your heart's chambers and valves are working.   You may receive an ultrasound enhancing agent through an IV if needed to better visualize your heart during the echo. This procedure takes approximately one hour.  There are no restrictions for this procedure.  This will take place at [address], [place] [address]     Your cardiac CT will be scheduled at one of the below locations:    [facility] [address] [place], [place] [address] [phone]  OR   [facility] [address] [place], [place] [address] [phone]   If scheduled at [facility] or [facility], please arrive 15 mins early for check-in and test prep.  There is spacious parking and easy access to the radiology department from the [facility] entrance. Please enter here and check-in with the desk attendant.   Please follow these instructions carefully (unless otherwise directed):  An IV will be required for this test and Nitroglycerin will be given.   On the Night Before the Test: Be sure to Drink plenty of water. Do not consume any caffeinated/decaffeinated beverages or chocolate 12 hours prior to your test. Do not take any antihistamines 12 hours prior to your  test.  On the Day of the Test: Drink plenty of water until 1 hour prior to the test. Do not eat any food 1 hour prior to test. You may take your regular medications prior to the test.  Take metoprolol tartrate  (Lopressor) 100 mg two hours prior to test. If you take Furosemide/Hydrochlorothiazide/Spironolactone, please HOLD on the morning of the test. FEMALES- please wear underwire-free bra if available, avoid dresses & tight clothing   After the Test: Drink plenty of water. After receiving IV contrast, you may experience a mild flushed feeling. This is normal. On occasion, you may experience a mild rash up to 24 hours after the test. This is not dangerous. If this occurs, you can take Benadryl 25 mg and increase your fluid intake. If you experience trouble breathing, this can be serious. If it is severe call 911 IMMEDIATELY. If it is mild, please call our office. If you take any of these medications: Glipizide/Metformin, Avandament, Glucavance, please do not take 48 hours after completing test unless otherwise instructed.  We will call to schedule your test 2-4 weeks out understanding that some insurance companies will need an authorization prior to the service being performed.   For more information and frequently asked questions, please visit our website : [email or web address]  For non-scheduling related questions, please contact the cardiac imaging nurse navigator should you have any questions/concerns: Cardiac Imaging Nurse Navigators Direct Office Dial: [phone]   For scheduling needs, including cancellations and rescheduling, please call [place], [phone].   Follow-Up: At [facility], you and your health needs are our priority.  As part of our continuing  mission to provide you with exceptional heart care, we have created designated Provider Care Teams.  These Care Teams include your primary Cardiologist (physician) and Advanced Practice Providers (APPs -   Physician Assistants and Nurse Practitioners) who all work together to provide you with the care you need, when you need it.  You will need a follow up appointment in 6 months  Providers on your designated Care Team:   Nicolasa Ducking, NP Eula Listen, PA-C Cadence Fransico Michael, [place]  COVID-19 Vaccine Information can be found at: [email or web address] For questions related to vaccine distribution or appointments, please email [email or web address] or call [phone].

## 2023-01-12 NOTE — Telephone Encounter (Signed)
Spoke to patient's daughter, [name](DPR). She stated that patient does not have SD card in bipap machine. According to [facility] with [employer], there is no data therapy for patient in [place]. Nida Boatman suggested having pt take machine into [employer] to be serviced. Aundra Millet is aware of recommendations and voiced her understanding.  Nothing further needed.

## 2023-01-14 ENCOUNTER — Encounter (INDEPENDENT_AMBULATORY_CARE_PROVIDER_SITE_OTHER): Payer: Self-pay

## 2023-01-14 ENCOUNTER — Encounter: Payer: Self-pay | Admitting: Internal Medicine

## 2023-01-14 ENCOUNTER — Ambulatory Visit: Payer: PPO | Admitting: Internal Medicine

## 2023-01-14 VITALS — BP 128/78 | HR 87 | Temp 97.6°F | Ht 64.0 in | Wt 213.2 lb

## 2023-01-14 DIAGNOSIS — G4733 Obstructive sleep apnea (adult) (pediatric): Secondary | ICD-10-CM | POA: Diagnosis not present

## 2023-01-14 NOTE — Progress Notes (Signed)
@Patient  ID: Dana Obrien, female    DOB: 1946/03/08, 77 y.o.   MRN: [id number]  TEST/EVENTS :  split-night sleep study on 12/04/2020 that showed severe obstructive sleep apnea, AHI 44/hr with SPO2 low 83%.  He was recommended that she start BiPAP therapy with pressure setting of 22/19 centimeters H2O, heated humidity and ResMed F 30 I fullface mask size small.   CC Follow up assessment for SEVERE OSA  HPI: 77 year old female never smoker followed for obstructive sleep apnea   01/14/2023 Follow up ; OSA  Patient returns for a 93-month follow-up Patient has underlying severe obstructive sleep apnea.   She had a split-night sleep study December 04, 2020 that showed severe sleep apnea with AHI of 44/hour with SPO2 low at 83%.    She had optimal control on BiPAP at 22/19 cm H2O.   BiPAP download reviewed in detail with patient Over the last 3 months patient has had excellent compliance Patient use and benefits from therapy Her AHI is significantly reduced to 0.3  Patient was seen by cardiology several weeks ago for chest pain and shortness of breath Patient did not have acute MI no PE  CT of the chest showed calcified nodule subcentimeter Likely benign  Patient doing extremely well with her BiPAP  Allergies  Allergen Reactions   Lidocaine Palpitations   Codeine Nausea Only and Nausea And Vomiting   Morphine Nausea Only   Niacin Rash   Niacin And Related Rash    ANTIHYPERLIPEMICS    Immunization History  Administered Date(s) Administered   Fluad Quad(high Dose 65+) 02/23/2020   Influenza Whole 06/04/2008   Influenza, High Dose Seasonal PF 08/11/2017, 05/30/2018   Influenza,inj,Quad PF,6+ Mos 03/06/2014   Influenza-Unspecified 03/21/2012, 03/01/2013, 04/30/2015   PFIZER(Purple Top)SARS-COV-2 Vaccination 06/08/2019, 07/01/2019   Pneumococcal Conjugate-13 10/25/2013   Pneumococcal Polysaccharide-23 10/19/2012   Td 06/01/2005   Tdap 12/17/2022    Past Medical History:   Diagnosis Date   Anxiety    Arthritis    Asthma    Breast cancer (HCC) 2004   right breast   Breast cancer (HCC) 09/08/2017   left breast   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   Cancer [facility]) 2004   right lumpectomy, chemotherapy and radiation Dr. Koleen Nimrod   CPAP (continuous positive airway pressure) dependence    Depression    GERD (gastroesophageal reflux disease)    Hypertension    Migraine    Personal history of chemotherapy 2004   Personal history of radiation therapy 2004   Personal history of radiation therapy 2019   PONV (postoperative nausea and vomiting)    Shingles    Sleep apnea    uses BiPAP, severe OSA   Vitamin D deficiency    IN THE PAST    Tobacco History: Social History   Tobacco Use  Smoking Status Never  Smokeless Tobacco Never   Counseling given: Not Answered   Outpatient Medications Prior to Visit  Medication Sig Dispense Refill   albuterol (VENTOLIN HFA) 108 (90 Base) MCG/ACT inhaler Inhale 2 puffs into the lungs every 6 (six) hours as needed for wheezing or shortness of breath. 8 g 2   ALPRAZolam (XANAX) 0.25 MG tablet Take 1 tablet (0.25 mg total) by mouth 2 (two) times daily as needed. for anxiety 20 tablet 0   celecoxib (CELEBREX) 100 MG capsule Take 1 capsule by mouth twice daily 60 capsule 0  cetirizine (EQ ALLERGY RELIEF, CETIRIZINE,) 10 MG tablet Take 1 tablet (10 mg total) by mouth daily. 90 tablet 0   EQ ARTHRITIS PAIN RELIEVER 1 % GEL APPLY 2 GRAMS TOPICALLY FOUR TIMES DAILY AS NEEDED 50 g 0   FLUoxetine (PROZAC) 20 MG capsule Take 3 capsules (60 mg total) by mouth daily. 90 capsule 3   fluticasone (FLONASE) 50 MCG/ACT nasal spray Place 1 spray into both nostrils daily. 16 g 0   furosemide (LASIX) 20 MG tablet Take 1 tablet (20 mg total) by mouth daily as needed. For ankle swelling. 30 tablet 1   hydrochlorothiazide (HYDRODIURIL) 25 MG  tablet Take 1 tablet (25 mg total) by mouth daily. 90 tablet 3   methocarbamol (ROBAXIN) 500 MG tablet TAKE 1 TABLET BY MOUTH EVERY 8 HOURS AS NEEDED FOR MUSCLE SPASM 30 tablet 0   metoprolol tartrate (LOPRESSOR) 100 MG tablet Take 1 tablet (100 mg total) by mouth once for 1 dose. 1 tablet 0   Multiple Vitamin (MULTI-VITAMIN) tablet Take 1 tablet by mouth daily.      Omega-3 Fatty Acids (FISH OIL PO) Take 1 tablet by mouth daily.     ondansetron (ZOFRAN) 4 MG tablet Take 1 tablet (4 mg total) by mouth every 8 (eight) hours as needed for nausea or vomiting. 20 tablet 0   pantoprazole (PROTONIX) 40 MG tablet Take 1 tablet (40 mg total) by mouth daily. 90 tablet 0   rosuvastatin (CRESTOR) 40 MG tablet Take 1 tablet (40 mg total) by mouth daily. 90 tablet 3   senna (SENOKOT) 8.6 MG TABS tablet Take 1 tablet by mouth at bedtime.      SUMAtriptan (IMITREX) 50 MG tablet Take 1 tablet (50 mg total) by mouth every 2 (two) hours as needed for migraine. May repeat in 2 hours if headache persists or recurs. No more than 2 doses in a 24 hour time frame. 10 tablet 0   traMADol (ULTRAM) 50 MG tablet TAKE 1 TABLET BY MOUTH EVERY 6 HOURS AS NEEDED 30 tablet 0   tretinoin (RETIN-A) 0.05 % cream Apply 1 application topically at bedtime. 45 g 0   valsartan (DIOVAN) 320 MG tablet Take 1 tablet (320 mg total) by mouth daily. 90 tablet 3   No facility-administered medications prior to visit.     BP 128/78 (BP Location: Left Arm, Cuff Size: Normal)   Pulse 87   Temp 97.6 F (36.4 C) (Temporal)   Ht 5\' 4"  (1.626 m)   Wt 213 lb 3.2 oz (96.7 kg)   SpO2 96%   BMI 36.60 kg/m    Review of Systems: Gen:  Denies  fever, sweats, chills weight loss  HEENT: Denies blurred vision, double vision, ear pain, eye pain, hearing loss, nose bleeds, sore throat Cardiac:  No dizziness, chest pain or heaviness, chest tightness,edema, No JVD Resp:   No cough, -sputum production, -shortness of breath,-wheezing, -hemoptysis,   Other:  All other systems negative   Physical Examination:   General Appearance: No distress  EYES PERRLA, EOM intact.   NECK Supple, No JVD Pulmonary: normal breath sounds, No wheezing.  CardiovascularNormal S1,S2.  No m/r/g.   Abdomen: Benign, Soft, non-tender. Neurology UE/LE 5/5 strength, no focal deficits Ext pulses intact, cap refill intact ALL OTHER ROS ARE NEGATIVE    Assessment & Plan:   77 year old pleasant white female seen today for follow-up assessment for severe OSA    OSA (obstructive sleep apnea) Encouraged on BIPAP compliance  Patient use and benefits from  therapy Excellent compliance report  Preop assessment for right knee surgery Patient is a moderate risk for pulmonary complications - discussed how weight can impact sleep and risk for sleep disordered breathing - discussed options to assist with weight loss: combination of diet modification, cardiovascular and strength training exercises Patient is at maximum medical therapy for sleep apnea   - had an extensive discussion regarding the adverse health consequences related to untreated sleep disordered breathing - specifically discussed the risks for hypertension, coronary artery disease, cardiac dysrhythmias, cerebrovascular disease, and diabetes - lifestyle modification discussed   Obesity -recommend significant weight loss -recommend changing diet  Deconditioned state -Recommend increased daily activity and exercise     CURRENT MEDICATIONS REVIEWED AT LENGTH WITH PATIENT TODAY   Patient  satisfied with Plan of action and management. All questions answered  Follow up  6 months  Total Time Spent  35 mins   Wallis Bamberg Santiago Glad, M.D.  [facility]  Medical Director [facility] Medical Director [facility] Cardio-Pulmonary Department

## 2023-01-14 NOTE — Patient Instructions (Signed)
EXCELLENT JOB WITH BIPAP A+!!!!  Continue as prescribed  Recommend 20-30 pound weight loss  Follow up with Cardiology   You are at a moderate risk for Lung complications for any type of general anesthesia surgery

## 2023-01-19 ENCOUNTER — Other Ambulatory Visit: Payer: Self-pay | Admitting: Surgical

## 2023-01-20 ENCOUNTER — Telehealth (HOSPITAL_COMMUNITY): Payer: Self-pay | Admitting: *Deleted

## 2023-01-20 ENCOUNTER — Encounter (HOSPITAL_COMMUNITY): Payer: Self-pay

## 2023-01-20 NOTE — Telephone Encounter (Signed)
Attempted to call patient regarding upcoming cardiac CT appointment. °Left message on voicemail with name and callback number ° °Merle Prescott RN Navigator Cardiac Imaging °[facility] °[phone] Office °[phone] Cell ° °

## 2023-01-21 ENCOUNTER — Ambulatory Visit: Admission: RE | Admit: 2023-01-21 | Payer: PPO | Source: Ambulatory Visit

## 2023-01-26 ENCOUNTER — Other Ambulatory Visit: Payer: Self-pay | Admitting: Cardiovascular Disease

## 2023-01-26 ENCOUNTER — Other Ambulatory Visit: Payer: Self-pay | Admitting: Family Medicine

## 2023-01-26 DIAGNOSIS — R002 Palpitations: Secondary | ICD-10-CM

## 2023-01-26 DIAGNOSIS — R600 Localized edema: Secondary | ICD-10-CM

## 2023-01-26 DIAGNOSIS — I209 Angina pectoris, unspecified: Secondary | ICD-10-CM

## 2023-01-26 DIAGNOSIS — R0602 Shortness of breath: Secondary | ICD-10-CM

## 2023-01-26 DIAGNOSIS — R072 Precordial pain: Secondary | ICD-10-CM

## 2023-01-26 DIAGNOSIS — R079 Chest pain, unspecified: Secondary | ICD-10-CM

## 2023-01-27 ENCOUNTER — Telehealth (HOSPITAL_COMMUNITY): Payer: Self-pay | Admitting: *Deleted

## 2023-01-27 NOTE — Telephone Encounter (Signed)
Patient calling to cancel her CCTA due to cost.  Advised her to reach out to Dr. Windell Hummingbird office if she changes her mind. She verbalized understanding.  Larey Brick RN Navigator Cardiac Imaging [facility] [phone] Office [phone] Cell

## 2023-01-28 ENCOUNTER — Ambulatory Visit: Admission: RE | Admit: 2023-01-28 | Payer: PPO | Source: Ambulatory Visit

## 2023-01-28 ENCOUNTER — Ambulatory Visit: Payer: PPO | Admitting: Family Medicine

## 2023-01-28 ENCOUNTER — Other Ambulatory Visit: Payer: Self-pay | Admitting: Nurse Practitioner

## 2023-01-28 NOTE — Telephone Encounter (Signed)
Sent to pharmacy.  Patient missed her appointment with me this morning.  Please make sure she gets rescheduled for follow-up of her depression.  Thanks.

## 2023-02-06 ENCOUNTER — Other Ambulatory Visit: Payer: Self-pay | Admitting: Family Medicine

## 2023-02-08 ENCOUNTER — Ambulatory Visit: Payer: PPO | Admitting: Family Medicine

## 2023-02-08 ENCOUNTER — Ambulatory Visit: Payer: PPO

## 2023-02-16 ENCOUNTER — Other Ambulatory Visit: Payer: Self-pay | Admitting: Family Medicine

## 2023-02-17 NOTE — Telephone Encounter (Signed)
Patient needs follow-up for future refills to be given. Refill sent in today.

## 2023-02-17 NOTE — Telephone Encounter (Signed)
Mychart msg has been sent to pt informing pt to call and book a follow up appt to avoid delays in her next refill on this medication

## 2023-02-19 ENCOUNTER — Other Ambulatory Visit: Payer: Self-pay | Admitting: Family Medicine

## 2023-02-19 ENCOUNTER — Ambulatory Visit: Payer: PPO | Admitting: Surgical

## 2023-03-03 ENCOUNTER — Encounter: Payer: Self-pay | Admitting: Family Medicine

## 2023-03-03 ENCOUNTER — Ambulatory Visit (INDEPENDENT_AMBULATORY_CARE_PROVIDER_SITE_OTHER): Payer: PPO | Admitting: Family Medicine

## 2023-03-03 VITALS — BP 138/82 | HR 73 | Temp 97.9°F | Ht 64.0 in | Wt 209.8 lb

## 2023-03-03 DIAGNOSIS — F32A Depression, unspecified: Secondary | ICD-10-CM

## 2023-03-03 DIAGNOSIS — R0602 Shortness of breath: Secondary | ICD-10-CM | POA: Diagnosis not present

## 2023-03-03 DIAGNOSIS — F419 Anxiety disorder, unspecified: Secondary | ICD-10-CM | POA: Diagnosis not present

## 2023-03-03 DIAGNOSIS — I10 Essential (primary) hypertension: Secondary | ICD-10-CM

## 2023-03-03 DIAGNOSIS — G4733 Obstructive sleep apnea (adult) (pediatric): Secondary | ICD-10-CM | POA: Diagnosis not present

## 2023-03-03 NOTE — Progress Notes (Signed)
Marikay Alar, MD Phone: [phone]  Dana Obrien is a 77 y.o. female who presents today for follow-up.  Hypertension: Not checking consistently.  Her daughter notes her blood pressure does seem to be similar today when she does check.  She is on HCTZ 25 mg daily and valsartan 160 mg daily.  She did not tolerate the 320 mg dose of valsartan as she did not feel good on it.  She has had occasional chest tightness and has dyspnea on exertion.  These are ongoing issues.  Edema seems to be improved since she was in the hospital.  Chest pain/shortness of breath: This is an ongoing issue.  She has seen cardiology and they recommended coronary calcium scoring as well as an echo though the patient did not want to have them done as she did not want to know what was going on.  Per daughter notes she advised her that these were preventative in trying to figure out what was going on so they can prevent worsening this from happening.  Notes she only takes the Lasix as needed now.  Anxiety: Patient notes her anxiety is generally well-controlled.  She has not taken her Xanax in 10 days as she thinks it may be bad for her to continue taking this.  She is taking Prozac.  She notes mild depression.  No SI.  She was started on bupropion when she was hospitalized for depression previously though notes this made her significantly nervous so she discontinued it.  Social History   Tobacco Use  Smoking Status Never  Smokeless Tobacco Never    Current Outpatient Medications on File Prior to Visit  Medication Sig Dispense Refill   albuterol (VENTOLIN HFA) 108 (90 Base) MCG/ACT inhaler Inhale 2 puffs into the lungs every 6 (six) hours as needed for wheezing or shortness of breath. 8 g 2   ALPRAZolam (XANAX) 0.25 MG tablet Take 1 tablet by mouth twice daily as needed for anxiety 20 tablet 0   celecoxib (CELEBREX) 100 MG capsule Take 1 capsule by mouth twice daily 60 capsule 0   cetirizine (EQ ALLERGY RELIEF,  CETIRIZINE,) 10 MG tablet Take 1 tablet (10 mg total) by mouth daily. 90 tablet 0   EQ ARTHRITIS PAIN RELIEVER 1 % GEL APPLY 2 GRAMS TOPICALLY FOUR TIMES DAILY AS NEEDED 50 g 0   FLUoxetine (PROZAC) 20 MG capsule Take 3 capsules (60 mg total) by mouth daily. 90 capsule 3   fluticasone (FLONASE) 50 MCG/ACT nasal spray Place 1 spray into both nostrils daily. 16 g 0   furosemide (LASIX) 20 MG tablet Take 1 tablet (20 mg total) by mouth daily as needed. For ankle swelling. 30 tablet 1   hydrochlorothiazide (HYDRODIURIL) 25 MG tablet Take 1 tablet (25 mg total) by mouth daily. 90 tablet 3   methocarbamol (ROBAXIN) 500 MG tablet TAKE 1 TABLET BY MOUTH EVERY 8 HOURS AS NEEDED FOR MUSCLE SPASM 30 tablet 0   Multiple Vitamin (MULTI-VITAMIN) tablet Take 1 tablet by mouth daily.      Omega-3 Fatty Acids (FISH OIL PO) Take 1 tablet by mouth daily.     ondansetron (ZOFRAN) 4 MG tablet Take 1 tablet (4 mg total) by mouth every 8 (eight) hours as needed for nausea or vomiting. 20 tablet 0   pantoprazole (PROTONIX) 40 MG tablet Take 1 tablet by mouth once daily 90 tablet 0   rosuvastatin (CRESTOR) 40 MG tablet Take 1 tablet (40 mg total) by mouth daily. 90 tablet 3  senna (SENOKOT) 8.6 MG TABS tablet Take 1 tablet by mouth at bedtime.      SUMAtriptan (IMITREX) 50 MG tablet Take 1 tablet (50 mg total) by mouth every 2 (two) hours as needed for migraine. May repeat in 2 hours if headache persists or recurs. No more than 2 doses in a 24 hour time frame. 10 tablet 0   traMADol (ULTRAM) 50 MG tablet TAKE 1 TABLET BY MOUTH EVERY 6 HOURS AS NEEDED 30 tablet 0   tretinoin (RETIN-A) 0.05 % cream Apply 1 application topically at bedtime. 45 g 0   valsartan (DIOVAN) 320 MG tablet Take 1 tablet (320 mg total) by mouth daily. 90 tablet 3   No current facility-administered medications on file prior to visit.     ROS see history of present illness  Objective  Physical Exam Vitals:   03/03/23 1402 03/03/23 1403  BP:  (!) 144/86 138/82  Pulse: 73   Temp: 97.9 F (36.6 C)   SpO2: 98%     BP Readings from Last 3 Encounters:  03/03/23 138/82  01/14/23 128/78  01/12/23 (!) 156/90   Wt Readings from Last 3 Encounters:  03/03/23 209 lb 12.8 oz (95.2 kg)  01/14/23 213 lb 3.2 oz (96.7 kg)  01/12/23 214 lb 9.6 oz (97.3 kg)    Physical Exam Constitutional:      General: She is not in acute distress.    Appearance: She is not diaphoretic.  Cardiovascular:     Rate and Rhythm: Normal rate and regular rhythm.     Heart sounds: Normal heart sounds.  Pulmonary:     Effort: Pulmonary effort is normal.     Breath sounds: Normal breath sounds.  Musculoskeletal:     Comments: Trace pitting edema bilateral lower extremities to the mid shins  Skin:    General: Skin is warm and dry.  Neurological:     Mental Status: She is alert.      Assessment/Plan: Please see individual problem list.  Primary hypertension Assessment & Plan: Chronic issue.  Above goal.  Patient is not checking consistently at home.  Discussed with patient and her daughter having them checked daily for the next week and sending [place] her readings with her pulse.  Discussed the next step would be adding a beta-blocker or amlodipine.  Patient will continue HCTZ 25 mg daily and valsartan 160 mg daily.   Anxiety and depression Assessment & Plan: Chronic issue.  Patient reports generally adequate control.  She will continue Prozac 60 mg daily.  Discussed we could trial buspirone if she has worsening symptoms in the future.   Exertional shortness of breath Assessment & Plan: Chronic issue of exertional shortness of breath and chest tightness.  Discussed the importance of having complete workup through cardiology.  Advised that they get her CT scan and echo rescheduled.  Patient likely would not be able to complete a treadmill stress test though I did discuss that they do have nuclear medicine stress tests that may be an option if cardiology  felt this was appropriate for her.  The patient's daughter noted she would call and ask if that was an option.  Discussed it would be up to cardiology to determine if this was an appropriate part of her evaluation.  Discussed the importance of having workup for this so that we can try to prevent something worse from occurring down the line.     Return in about 3 months (around 06/03/2023).   Marikay Alar, MD   Primary Care - [employer]

## 2023-03-03 NOTE — Assessment & Plan Note (Signed)
Chronic issue.  Above goal.  Patient is not checking consistently at home.  Discussed with patient and her daughter having them checked daily for the next week and sending [place] her readings with her pulse.  Discussed the next step would be adding a beta-blocker or amlodipine.  Patient will continue HCTZ 25 mg daily and valsartan 160 mg daily.

## 2023-03-03 NOTE — Assessment & Plan Note (Signed)
Chronic issue.  Patient reports generally adequate control.  She will continue Prozac 60 mg daily.  Discussed we could trial buspirone if she has worsening symptoms in the future.

## 2023-03-03 NOTE — Assessment & Plan Note (Signed)
Chronic issue of exertional shortness of breath and chest tightness.  Discussed the importance of having complete workup through cardiology.  Advised that they get her CT scan and echo rescheduled.  Patient likely would not be able to complete a treadmill stress test though I did discuss that they do have nuclear medicine stress tests that may be an option if cardiology felt this was appropriate for her.  The patient's daughter noted she would call and ask if that was an option.  Discussed it would be up to cardiology to determine if this was an appropriate part of her evaluation.  Discussed the importance of having workup for this so that we can try to prevent something worse from occurring down the line.

## 2023-03-03 NOTE — Patient Instructions (Signed)
Please send me your BP and pulse readings in one week.

## 2023-03-05 ENCOUNTER — Other Ambulatory Visit: Payer: Self-pay | Admitting: Oncology

## 2023-03-05 ENCOUNTER — Ambulatory Visit
Admission: RE | Admit: 2023-03-05 | Discharge: 2023-03-05 | Disposition: A | Discharge status: Home / Self Care | Payer: PPO | Source: Ambulatory Visit | Attending: Oncology | Admitting: Oncology

## 2023-03-05 DIAGNOSIS — C50412 Malignant neoplasm of upper-outer quadrant of left female breast: Secondary | ICD-10-CM | POA: Insufficient documentation

## 2023-03-05 DIAGNOSIS — Z853 Personal history of malignant neoplasm of breast: Secondary | ICD-10-CM

## 2023-03-05 DIAGNOSIS — Z170 Estrogen receptor positive status [ER+]: Secondary | ICD-10-CM

## 2023-03-09 ENCOUNTER — Other Ambulatory Visit: Payer: Self-pay | Admitting: Surgical

## 2023-03-09 ENCOUNTER — Other Ambulatory Visit: Payer: Self-pay | Admitting: Family Medicine

## 2023-03-10 ENCOUNTER — Telehealth (HOSPITAL_COMMUNITY): Payer: Self-pay | Admitting: *Deleted

## 2023-03-10 ENCOUNTER — Telehealth: Payer: Self-pay | Admitting: Family Medicine

## 2023-03-10 NOTE — Telephone Encounter (Signed)
Attempted to call patient regarding upcoming cardiac CT appointment. °Left message on voicemail with name and callback number ° °Edie Vallandingham RN Navigator Cardiac Imaging °[facility] °[phone] Office °[phone] Cell ° °

## 2023-03-10 NOTE — Telephone Encounter (Signed)
Patient daughter returning call about her mother's upcoming cardiac imaging study; pt's duaghter verbalizes understanding of appt date/time, parking situation and where to check in, pre-test NPO status and medications ordered, and verified current allergies; name and call back number provided for further questions should they arise  Larey Brick RN Navigator Cardiac Imaging [facility] [phone] office [phone] cell  Patient to take 100mg  metoprolol tartrate two hours prior to her cardiac CT scan.

## 2023-03-10 NOTE — Telephone Encounter (Signed)
Copied from CRM [id number]. Topic: Medicare AWV >> Mar 10, 2023  3:04 PM Payton Doughty wrote: Reason for CRM: Called LVM 03/10/2023 to schedule Annual Wellness Visit  Verlee Rossetti; Care Guide Ambulatory Clinical Support [place] [facility] Direct Dial: [phone]

## 2023-03-11 ENCOUNTER — Ambulatory Visit: Payer: PPO | Admitting: Cardiology

## 2023-03-11 ENCOUNTER — Ambulatory Visit
Admission: RE | Admit: 2023-03-11 | Discharge: 2023-03-11 | Disposition: A | Discharge status: Home / Self Care | Payer: PPO | Source: Ambulatory Visit | Attending: Cardiovascular Disease | Admitting: Cardiovascular Disease

## 2023-03-11 DIAGNOSIS — R072 Precordial pain: Secondary | ICD-10-CM | POA: Diagnosis not present

## 2023-03-11 MED ORDER — IOHEXOL 350 MG/ML SOLN
100.0000 mL | Freq: Once | INTRAVENOUS | Status: AC | PRN
  Administered 2023-03-11: 100 mL via INTRAVENOUS

## 2023-03-11 MED ORDER — SODIUM CHLORIDE 0.9 % IV BOLUS
150.0000 mL | Freq: Once | INTRAVENOUS | Status: AC
  Administered 2023-03-11: 150 mL via INTRAVENOUS

## 2023-03-11 MED ORDER — NITROGLYCERIN 0.4 MG SL SUBL
0.8000 mg | SUBLINGUAL_TABLET | Freq: Once | SUBLINGUAL | Status: AC
  Administered 2023-03-11: 0.8 mg via SUBLINGUAL

## 2023-03-11 NOTE — Progress Notes (Signed)
Pt tolerated procedure well with no issues. Pt ABCs intact. Pt denies any complaints. Pt encouraged to drink plenty of water throughout the day. Pt ambulatory with steady gait.

## 2023-03-12 ENCOUNTER — Ambulatory Visit (INDEPENDENT_AMBULATORY_CARE_PROVIDER_SITE_OTHER): Payer: PPO | Admitting: Surgical

## 2023-03-12 ENCOUNTER — Other Ambulatory Visit (INDEPENDENT_AMBULATORY_CARE_PROVIDER_SITE_OTHER): Payer: PPO

## 2023-03-12 ENCOUNTER — Other Ambulatory Visit: Payer: Self-pay

## 2023-03-12 DIAGNOSIS — M5416 Radiculopathy, lumbar region: Secondary | ICD-10-CM | POA: Diagnosis not present

## 2023-03-12 DIAGNOSIS — M19072 Primary osteoarthritis, left ankle and foot: Secondary | ICD-10-CM | POA: Diagnosis not present

## 2023-03-12 DIAGNOSIS — M79672 Pain in left foot: Secondary | ICD-10-CM

## 2023-03-12 DIAGNOSIS — M1711 Unilateral primary osteoarthritis, right knee: Secondary | ICD-10-CM

## 2023-03-12 DIAGNOSIS — M1712 Unilateral primary osteoarthritis, left knee: Secondary | ICD-10-CM

## 2023-03-13 ENCOUNTER — Encounter: Payer: Self-pay | Admitting: Surgical

## 2023-03-13 MED ORDER — LIDOCAINE HCL 1 % IJ SOLN
3.0000 mL | INTRAMUSCULAR | Status: AC | PRN
Start: 2023-03-12 — End: 2023-03-12
  Administered 2023-03-12: 3 mL

## 2023-03-13 MED ORDER — LIDOCAINE HCL 1 % IJ SOLN
5.0000 mL | INTRAMUSCULAR | Status: AC | PRN
Start: 2023-03-12 — End: 2023-03-12
  Administered 2023-03-12: 5 mL

## 2023-03-13 MED ORDER — BUPIVACAINE HCL 0.25 % IJ SOLN
4.0000 mL | INTRAMUSCULAR | Status: AC | PRN
Start: 2023-03-12 — End: 2023-03-12
  Administered 2023-03-12: 4 mL via INTRA_ARTICULAR

## 2023-03-13 MED ORDER — METHYLPREDNISOLONE ACETATE 40 MG/ML IJ SUSP
40.0000 mg | INTRAMUSCULAR | Status: AC | PRN
Start: 2023-03-12 — End: 2023-03-12
  Administered 2023-03-12: 40 mg via INTRA_ARTICULAR

## 2023-03-13 MED ORDER — BUPIVACAINE HCL 0.25 % IJ SOLN
0.6600 mL | INTRAMUSCULAR | Status: AC | PRN
Start: 2023-03-12 — End: 2023-03-12
  Administered 2023-03-12: .66 mL via INTRA_ARTICULAR

## 2023-03-13 MED ORDER — METHYLPREDNISOLONE ACETATE 40 MG/ML IJ SUSP
13.0000 mg | INTRAMUSCULAR | Status: AC | PRN
Start: 2023-03-12 — End: 2023-03-12
  Administered 2023-03-12: 13 mg via INTRA_ARTICULAR

## 2023-03-13 NOTE — Progress Notes (Signed)
Office Visit Note   Patient: Dana Obrien           Date of Birth: 1946/02/01           MRN: [id number] Visit Date: 03/12/2023 Requested by: Glori Luis, MD [address] [place],  [place] [address] PCP: Glori Luis, MD  Subjective: Chief Complaint  Patient presents with   Other    Bilateral knee injections requested    HPI: Dana Obrien is a 77 y.o. female who presents to the office reporting bilateral knee pain.  Patient has history of bilateral knee osteoarthritis.  She has had previous cortisone injections with good relief and returns today for repeat injections.  No recent fall or injury to either knee.  She does report increased pain in her left foot recently.  She has been helping her daughter with work around the house for the past 2 days and noticed that her left foot is swollen on the dorsal aspect of the foot.  She denies any fall or injury or any incident where she dropped something on her foot.  There is no bruising that she has noticed.  Pain does keep her up at night a little bit in the past couple days.  No history of prior fracture to this foot.  She is able to ambulate though it is sore when she walks.  This foot is bothering her more than her knees currently..                ROS: All systems reviewed are negative as they relate to the chief complaint within the history of present illness.  Patient denies fevers or chills.  Assessment & Plan: Visit Diagnoses:  1. Pain in left foot   2. Unilateral primary osteoarthritis, right knee   3. Unilateral primary osteoarthritis, left knee   4. Radiculopathy, lumbar region     Plan: Patient is a 77 year old female who presents for evaluation of bilateral knee pain and left foot pain.  Has history of bilateral knee osteoarthritis.  Last radiographs of her knees were about 3 years ago so new radiographs were taken today demonstrating moderate arthritis in the left knee with more mild arthritis  in the right knee consistent with her complaint of left knee bothering her more than her right knee.  She would like to try cortisone injection today.  Left knee was injected with cortisone and patient tolerated procedure well without complication.  She also complains of left foot pain and radiographs taken today demonstrate no acute osseous abnormality.  She has significant degenerative changes of the dorsal midfoot with osteophyte formation and subchondral sclerosis.  After discussion of options, patient would like to try cortisone injection into the left foot instead of into her right knee.  Ultrasound machine was utilized to identify the navicular cuneiform joint where area of maximal tenderness was.  The dorsalis pedis artery was identified with ultrasound and care was taken to avoid this.  Injection successfully administered and patient Toller procedure well without any complication.  During the anesthetic portion of the exam as patient walked out of the clinic she had 100% resolution of her foot pain.  She will follow-up in 7 to 10 days for right knee injection.  Follow-Up Instructions: No follow-ups on file.   Orders:  Orders Placed This Encounter  Procedures   XR Foot Complete Left   XR KNEE 3 VIEW LEFT   XR Knee 1-2 Views Right   US Guided Needle  Placement - No Linked Charges   Ambulatory referral to Physical Medicine Rehab   No orders of the defined types were placed in this encounter.     Procedures: Left navicular cuneiform joint injection on 03/12/2023 11:16 AM Indications: pain and joint swelling Details: 25 G needle, ultrasound-guided Medications: 3 mL lidocaine 1 %; 13 mg methylPREDNISolone acetate 40 MG/ML; 0.66 mL bupivacaine 0.25 % Outcome: tolerated well, no immediate complications Procedure, treatment alternatives, risks and benefits explained, specific risks discussed. Consent was given by the patient. Immediately prior to procedure a time out was called to verify the  correct patient, procedure, equipment, support staff and site/side marked as required. Patient was prepped and draped in the usual sterile fashion.    Large Joint Inj: L knee on 03/12/2023 11:18 AM Indications: diagnostic evaluation, joint swelling and pain Details: 18 G 1.5 in needle, superolateral approach  Arthrogram: No  Medications: 5 mL lidocaine 1 %; 40 mg methylPREDNISolone acetate 40 MG/ML; 4 mL bupivacaine 0.25 % Outcome: tolerated well, no immediate complications Procedure, treatment alternatives, risks and benefits explained, specific risks discussed. Consent was given by the patient. Immediately prior to procedure a time out was called to verify the correct patient, procedure, equipment, support staff and site/side marked as required. Patient was prepped and draped in the usual sterile fashion.       Clinical Data: No additional findings.  Objective: Vital Signs: There were no vitals taken for this visit.  Physical Exam:  Constitutional: Patient appears well-developed HEENT:  Head: Normocephalic Eyes:EOM are normal Neck: Normal range of motion Cardiovascular: Normal rate Pulmonary/chest: Effort normal Neurologic: Patient is alert Skin: Skin is warm Psychiatric: Patient has normal mood and affect  Ortho Exam: Ortho exam demonstrates bilateral knees with small effusion in the right knee and no effusion in the left knee.  No calf tenderness.  Negative Homans' sign bilaterally.  Patient able to perform straight leg raise with both knees.  She has no pain with hip range of motion bilaterally.  She has tenderness over the medial joint line moderately in the left knee and mildly in the right knee.  No significant lateral joint line tenderness in the left knee but does have mild lateral joint line tenderness in the right knee.  No ligamentous instability of either knee.  Left foot with palpable DP pulse.  She has no bruising noted.  There is significant swelling over the  dorsal aspect of the left midfoot.  She has intact ankle dorsiflexion, plantarflexion, EHL, inversion/eversion.  Anterior tibialis tendon is palpable and intact bilaterally.  No tenderness over the ankle joint line.  No tenderness over the fifth metatarsal base or any of the metatarsal shafts.  Does have moderate tenderness over the area of swelling along the midfoot.  Specialty Comments:  No specialty comments available.  Imaging: No results found.   PMFS History: Patient Active Problem List   Diagnosis Date Noted   Hypokalemia 01/03/2023   Positive D dimer 01/03/2023   Palpitations 11/04/2022   MDD (major depressive disorder), recurrent episode, moderate (HCC) 09/09/2022   Major depressive disorder, single episode, severe without psychosis (HCC) 09/09/2022   Hydronephrosis 07/21/2022   Left lower quadrant pain 04/20/2022   Urge incontinence 07/11/2021   Right knee pain 11/08/2020   Vitamin D deficiency 08/08/2020   Gastroesophageal reflux disease without esophagitis 03/25/2020   Dermatitis due to plants, including poison ivy, sumac, and oak 02/29/2020   Neuropathy 12/15/2019   Allergic rhinitis 07/05/2019   Brain atrophy (HCC) 05/18/2019  Bilateral hearing loss 08/15/2018   Decreased energy 08/15/2018   Prediabetes 06/02/2018   Arthralgia 06/02/2018   BPPV (benign paroxysmal positional vertigo), right 03/11/2018   Dysfunction of right eustachian tube 03/11/2018   Malignant neoplasm of upper-outer quadrant of left breast in female, 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/14/2017   Fatty liver 08/24/2017   Hearing difficulty of both ears 06/25/2017   Bilateral sensorineural hearing loss 10/13/2016   Right ear impacted cerumen 10/13/2016   GERD (gastroesophageal reflux disease) 09/07/2016   Exertional shortness of breath 01/15/2016   Uterine prolapse 11/14/2015   OSA (obstructive sleep apnea) 11/01/2015   Back muscle spasm 04/30/2015   DDD (degenerative disc disease), lumbar  04/30/2015   Lumbar radiculitis 04/30/2015   Low back pain 01/01/2015   Varicose vein of leg 11/22/2014   Increased frequency of urination 11/17/2013   Rectocele 11/17/2013   Cystocele 10/25/2013   Disorder of skin and subcutaneous tissue 10/25/2013   Midline cystocele 10/25/2013   Obesity, unspecified 10/19/2012   History of breast cancer 02/11/2012   Hyperlipidemia 05/11/2011   Osteopenia 05/11/2011   Disorder of bone and cartilage 05/11/2011   Anxiety and depression 03/04/2011   Hypertension 03/04/2011   Past Medical History:  Diagnosis Date   Anxiety    Arthritis    Asthma    Breast cancer (HCC) 2004   right breast   Breast cancer (HCC) 09/08/2017   left breast   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   Cancer [facility]) 2004   right lumpectomy, chemotherapy and radiation Dr. Koleen Nimrod   CPAP (continuous positive airway pressure) dependence    Depression    GERD (gastroesophageal reflux disease)    Hypertension    Migraine    Personal history of chemotherapy 2004   Personal history of radiation therapy 2004   Personal history of radiation therapy 2019   PONV (postoperative nausea and vomiting)    Shingles    Sleep apnea    uses BiPAP, severe OSA   Vitamin D deficiency    IN THE PAST    Family History  Problem Relation Age of Onset   Heart failure Mother    Heart disease Mother    Hyperlipidemia Mother    Hypertension Mother    Heart murmur Mother    Hypertension Father    Osteoarthritis Father    Stroke Father    Alzheimer's disease Sister    Alzheimer's disease Brother    Brain cancer Brother    Healthy Daughter    Healthy Son    Breast cancer Neg Hx     Past Surgical History:  Procedure Laterality Date   ABDOMINAL HYSTERECTOMY     ANTERIOR AND POSTERIOR REPAIR     BREAST BIOPSY Right 2004   stereo. 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  3.5 cm maximum diameter, minimal margins 5 mm.  In situ component less than 10%.   BREAST LUMPECTOMY Right 2004   BREAST LUMPECTOMY Left 09/17/2017   Procedure: BREAST EXCISION, SENTINEL NODE BIOPSY;  Surgeon: Earline Mayotte, MD;  Location: [facility] ORS;  Service: General;  Laterality: Left;   COLONOSCOPY  2015   COLONOSCOPY WITH PROPOFOL N/A 10/04/2019   Procedure: COLONOSCOPY WITH PROPOFOL;  Surgeon: Earline Mayotte, MD;  Location: [facility] ENDOSCOPY;  Service: Endoscopy;  Laterality: N/A;   DILATION AND CURETTAGE OF UTERUS     ETHMOIDECTOMY Bilateral 11/05/2016   Procedure: ETHMOIDECTOMY;  Surgeon: Christia Reading, MD;  Location: [facility] OR;  Service: ENT;  Laterality: Bilateral;   LAPAROTOMY     MAXILLARY ANTROSTOMY Bilateral 11/05/2016   Procedure: MAXILLARY ANTROSTOMY;  Surgeon: Christia Reading, MD;  Location[facility] OR;  Service: ENT;  Laterality: Bilateral;   SINUS ENDO W/FUSION Bilateral 11/05/2016   Procedure: FRONTAL RECESS EXPLORATION;  Surgeon: Christia Reading, MD;  Location: [facility] OR;  Service: ENT;  Laterality: Bilateral;  BILATERAL ENDOSCOPIC SINUS SURGERY WITH FUSION   SINUS EXPLORATION  11/05/2016   SPHENOIDECTOMY Bilateral 11/05/2016   Procedure: SPHENOIDECTOMY;  Surgeon: Christia Reading, MD;  Location: [facility] OR;  Service: ENT;  Laterality: Bilateral;   Social History   Occupational History   Not on file  Tobacco Use   Smoking status: Never   Smokeless tobacco: Never  Vaping Use   Vaping status: Never Used  Substance and Sexual Activity   Alcohol use: Not Currently    Comment: rarely   Drug use: No   Sexual activity: Not Currently

## 2023-03-23 ENCOUNTER — Ambulatory Visit
Admission: RE | Admit: 2023-03-23 | Discharge: 2023-03-23 | Disposition: A | Discharge status: Home / Self Care | Payer: PPO | Source: Ambulatory Visit | Attending: Oncology | Admitting: Oncology

## 2023-03-23 ENCOUNTER — Other Ambulatory Visit: Payer: Self-pay | Admitting: Oncology

## 2023-03-23 ENCOUNTER — Ambulatory Visit
Admission: RE | Admit: 2023-03-23 | Discharge: 2023-03-23 | Disposition: A | Discharge status: Home / Self Care | Payer: PPO | Source: Ambulatory Visit | Attending: Oncology

## 2023-03-23 DIAGNOSIS — C50412 Malignant neoplasm of upper-outer quadrant of left female breast: Secondary | ICD-10-CM | POA: Insufficient documentation

## 2023-03-23 DIAGNOSIS — R92321 Mammographic fibroglandular density, right breast: Secondary | ICD-10-CM | POA: Diagnosis not present

## 2023-03-23 DIAGNOSIS — Z170 Estrogen receptor positive status [ER+]: Secondary | ICD-10-CM | POA: Insufficient documentation

## 2023-03-23 DIAGNOSIS — R92322 Mammographic fibroglandular density, left breast: Secondary | ICD-10-CM | POA: Diagnosis not present

## 2023-03-23 DIAGNOSIS — R92323 Mammographic fibroglandular density, bilateral breasts: Secondary | ICD-10-CM | POA: Diagnosis not present

## 2023-03-23 DIAGNOSIS — N644 Mastodynia: Secondary | ICD-10-CM | POA: Diagnosis not present

## 2023-03-23 DIAGNOSIS — Z853 Personal history of malignant neoplasm of breast: Secondary | ICD-10-CM | POA: Insufficient documentation

## 2023-03-24 ENCOUNTER — Ambulatory Visit: Payer: PPO | Admitting: Internal Medicine

## 2023-03-25 ENCOUNTER — Ambulatory Visit: Payer: PPO | Attending: Cardiovascular Disease

## 2023-03-25 DIAGNOSIS — I209 Angina pectoris, unspecified: Secondary | ICD-10-CM

## 2023-03-25 DIAGNOSIS — R0602 Shortness of breath: Secondary | ICD-10-CM

## 2023-03-25 LAB — ECHOCARDIOGRAM COMPLETE
Area-P 1/2: 3.77 cm2
S' Lateral: 3 cm

## 2023-03-25 MED ORDER — PERFLUTREN LIPID MICROSPHERE
1.0000 mL | INTRAVENOUS | Status: AC | PRN
Start: 2023-03-25 — End: 2023-03-25
  Administered 2023-03-25: 3 mL via INTRAVENOUS

## 2023-04-08 ENCOUNTER — Other Ambulatory Visit: Payer: Self-pay | Admitting: Family Medicine

## 2023-04-18 ENCOUNTER — Other Ambulatory Visit: Payer: Self-pay | Admitting: Family Medicine

## 2023-05-12 ENCOUNTER — Ambulatory Visit
Admission: EM | Admit: 2023-05-12 | Discharge: 2023-05-12 | Disposition: A | Discharge status: Home / Self Care | Payer: PPO | Attending: Family Medicine | Admitting: Family Medicine

## 2023-05-12 DIAGNOSIS — N3000 Acute cystitis without hematuria: Secondary | ICD-10-CM | POA: Diagnosis not present

## 2023-05-12 LAB — URINALYSIS, W/ REFLEX TO CULTURE (INFECTION SUSPECTED)
Bilirubin Urine: NEGATIVE
Glucose, UA: NEGATIVE mg/dL
Hgb urine dipstick: NEGATIVE
Ketones, ur: NEGATIVE mg/dL
Nitrite: POSITIVE — AB
Protein, ur: NEGATIVE mg/dL
Specific Gravity, Urine: 1.015 (ref 1.005–1.030)
pH: 6 (ref 5.0–8.0)

## 2023-05-12 MED ORDER — NITROFURANTOIN MONOHYD MACRO 100 MG PO CAPS: 100.0000 mg | ORAL_CAPSULE | Freq: Two times a day (BID) | ORAL | 0 refills | Status: AC

## 2023-05-12 NOTE — ED Provider Notes (Signed)
[facility]    CSN: [id number] Arrival date & time: 05/12/23  1114      History   Chief Complaint Chief Complaint  Patient presents with   Dysuria     HPI HPI Dana Obrien is a 77 y.o. female.    Dana Obrien presents for dysuria intermittently for the past 3 to 4 weeks.  She has been trying AZO which helped but since she stopped it it comes back.  Has been having some chills but no fever.  Denies vomiting, nausea, new back pain. No LMP recorded. Patient has had a hysterectomy.          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   Cancer [facility]) 2004   right lumpectomy, chemotherapy and radiation Dr. Koleen Nimrod   CPAP (continuous positive airway pressure) dependence    Depression    GERD (gastroesophageal reflux disease)    Hypertension    Migraine    Personal history of chemotherapy 2004   Personal history of radiation therapy 2004   Personal history of radiation therapy 2019   PONV (postoperative nausea and vomiting)    Shingles    Sleep apnea    uses BiPAP, severe OSA   Vitamin D deficiency    IN THE PAST    Patient Active Problem List   Diagnosis Date Noted   Hypokalemia 01/03/2023   Positive D dimer 01/03/2023   Palpitations 11/04/2022   MDD (major depressive disorder), recurrent episode, moderate (HCC) 09/09/2022   Major depressive disorder, single episode, severe without psychosis (HCC) 09/09/2022   Hydronephrosis 07/21/2022   Left lower quadrant pain 04/20/2022   Urge incontinence 07/11/2021   Right knee pain 11/08/2020   Vitamin D deficiency 08/08/2020   Gastroesophageal reflux disease without esophagitis 03/25/2020   Dermatitis due to plants, including poison ivy,  sumac, and oak 02/29/2020   Neuropathy 12/15/2019   Allergic rhinitis 07/05/2019   Brain atrophy (HCC) 05/18/2019   Bilateral hearing loss 08/15/2018   Decreased energy 08/15/2018   Prediabetes 06/02/2018   Arthralgia 06/02/2018   BPPV (benign paroxysmal positional vertigo), right 03/11/2018   Dysfunction of right eustachian tube 03/11/2018   Malignant neoplasm of upper-outer quadrant of left breast in female, 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/14/2017   Fatty liver 08/24/2017   Hearing difficulty of both ears 06/25/2017   Bilateral sensorineural hearing loss 10/13/2016   Right ear impacted cerumen 10/13/2016   GERD (gastroesophageal reflux disease) 09/07/2016   Exertional shortness of breath 01/15/2016   Uterine prolapse 11/14/2015   OSA (obstructive sleep apnea) 11/01/2015   Back muscle spasm 04/30/2015   DDD (degenerative disc disease), lumbar 04/30/2015   Lumbar radiculitis 04/30/2015   Low back pain 01/01/2015   Varicose vein of leg 11/22/2014   Increased frequency of urination 11/17/2013   Rectocele 11/17/2013   Cystocele 10/25/2013   Disorder of skin and subcutaneous tissue 10/25/2013   Midline cystocele 10/25/2013   Obesity, unspecified 10/19/2012   History of breast cancer 02/11/2012   Hyperlipidemia 05/11/2011   Osteopenia 05/11/2011   Disorder of bone and cartilage 05/11/2011   Anxiety and depression 03/04/2011   Hypertension 03/04/2011    Past Surgical History:  Procedure Laterality Date   ABDOMINAL HYSTERECTOMY     ANTERIOR AND POSTERIOR REPAIR     BREAST BIOPSY Right 2004   stereo. 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  3.5 cm maximum diameter, minimal margins 5 mm.  In situ component less than 10%.   BREAST LUMPECTOMY Right 2004   BREAST LUMPECTOMY Left 09/17/2017    Procedure: BREAST EXCISION, SENTINEL NODE BIOPSY;  Surgeon: Earline Mayotte, MD;  Location: [facility] ORS;  Service: General;  Laterality: Left;   COLONOSCOPY  2015   COLONOSCOPY WITH PROPOFOL N/A 10/04/2019   Procedure: COLONOSCOPY WITH PROPOFOL;  Surgeon: Earline Mayotte, MD;  Location: [facility] ENDOSCOPY;  Service: Endoscopy;  Laterality: N/A;   DILATION AND CURETTAGE OF UTERUS     ETHMOIDECTOMY Bilateral 11/05/2016   Procedure: ETHMOIDECTOMY;  Surgeon: Christia Reading, MD;  Location: [facility] OR;  Service: ENT;  Laterality: Bilateral;   LAPAROTOMY     MAXILLARY ANTROSTOMY Bilateral 11/05/2016   Procedure: MAXILLARY ANTROSTOMY;  Surgeon: Christia Reading, MD;  Location: [facility] OR;  Service: ENT;  Laterality: Bilateral;   SINUS ENDO W/FUSION Bilateral 11/05/2016   Procedure: FRONTAL RECESS EXPLORATION;  Surgeon: Christia Reading, MD;  Location: [facility] OR;  Service: ENT;  Laterality: Bilateral;  BILATERAL ENDOSCOPIC SINUS SURGERY WITH FUSION   SINUS EXPLORATION  11/05/2016   SPHENOIDECTOMY Bilateral 11/05/2016   Procedure: SPHENOIDECTOMY;  Surgeon: Christia Reading, MD;  Location: [facility] OR;  Service: ENT;  Laterality: Bilateral;    OB History     Gravida  2   Para      Term      Preterm      AB      Living         SAB      IAB      Ectopic      Multiple      Live Births  2        Obstetric Comments  1st Menstrual Cycle:  15 1st Pregnancy: 31            Home Medications    Prior to Admission medications   Medication Sig Start Date End Date Taking? Authorizing Provider  celecoxib (CELEBREX) 100 MG capsule Take 1 capsule by mouth twice daily 04/19/23  Yes Glori Luis, MD  cetirizine (EQ ALLERGY RELIEF, CETIRIZINE,) 10 MG tablet Take 1 tablet (10 mg total) by mouth daily. 11/04/22  Yes Glori Luis, MD  FLUoxetine (PROZAC) 20 MG capsule TAKE 3 CAPSULES BY MOUTH ONCE DAILY 04/09/23  Yes Worthy Rancher B, FNP  hydrochlorothiazide (HYDRODIURIL) 25 MG tablet Take 1 tablet (25 mg total) by  mouth daily. 01/12/23 05/12/23 Yes Gollan, Tollie Pizza, MD  methocarbamol (ROBAXIN) 500 MG tablet TAKE 1 TABLET BY MOUTH EVERY 8 HOURS AS NEEDED FOR MUSCLE SPASM 03/09/23  Yes Magnant, Charles L, PA-C  Multiple Vitamin (MULTI-VITAMIN) tablet Take 1 tablet by mouth daily.    Yes [provider]  nitrofurantoin, macrocrystal-monohydrate, (MACROBID) 100 MG capsule Take 1 capsule (100 mg total) by mouth 2 (two) times daily for 7 days. 05/12/23 05/19/23 Yes Rhyen Mazariego, Seward Meth, DO  Omega-3 Fatty Acids (FISH OIL PO) Take 1 tablet by mouth daily.   Yes [provider]  pantoprazole (PROTONIX) 40 MG tablet Take 1 tablet by mouth once daily 04/09/23  Yes Eulis Foster, FNP  rosuvastatin (CRESTOR) 40 MG tablet Take 1 tablet (40 mg total) by mouth daily. 07/22/22  Yes Glori Luis, MD  senna (SENOKOT) 8.6 MG TABS tablet Take 1 tablet by mouth at bedtime.    Yes [provider]  SUMAtriptan (IMITREX) 50 MG tablet Take 1 tablet (50 mg total) by mouth every 2 (two) hours as needed for migraine. May repeat in 2 hours if headache persists or recurs. No more than 2 doses in a 24 hour time frame. 12/23/22  Yes Glori Luis, MD  traMADol (ULTRAM) 50 MG tablet TAKE 1 TABLET BY MOUTH EVERY 6 HOURS AS NEEDED 03/12/23  Yes Glori Luis, MD  tretinoin (RETIN-A) 0.05 % cream Apply 1 application topically at bedtime. 05/31/18  Yes Glori Luis, MD  valsartan (DIOVAN) 320 MG tablet Take 1 tablet (320 mg total) by mouth daily. 11/23/22  Yes Glori Luis, MD  albuterol (VENTOLIN HFA) 108 (90 Base) MCG/ACT inhaler Inhale 2 puffs into the lungs every 6 (six) hours as needed for wheezing or shortness of breath. 12/26/22   Irean Hong, MD  ALPRAZolam Prudy Feeler) 0.25 MG tablet Take 1 tablet by mouth twice daily as needed for anxiety 04/09/23   Eulis Foster, FNP  EQ ARTHRITIS PAIN RELIEVER 1 % GEL APPLY 2 GRAMS TOPICALLY FOUR TIMES DAILY AS NEEDED 04/17/22   Glori Luis, MD   fluticasone (FLONASE) 50 MCG/ACT nasal spray Place 1 spray into both nostrils daily. 07/02/21   Glori Luis, MD  furosemide (LASIX) 20 MG tablet Take 1 tablet (20 mg total) by mouth daily as needed. For ankle swelling. 01/12/23 01/12/24  Antonieta Iba, MD  ondansetron (ZOFRAN) 4 MG tablet Take 1 tablet (4 mg total) by mouth every 8 (eight) hours as needed for nausea or vomiting. 07/09/22   Cammy Copa, MD    Family History Family History  Problem Relation Age of Onset   Heart failure Mother    Heart disease Mother    Hyperlipidemia Mother    Hypertension Mother    Heart murmur Mother    Hypertension Father    Osteoarthritis Father    Stroke Father    Alzheimer's disease Sister    Alzheimer's disease Brother    Brain cancer Brother    Healthy Daughter    Healthy Son    Breast cancer Neg Hx     Social History Social History   Tobacco Use   Smoking status: Never   Smokeless tobacco: Never  Vaping Use   Vaping status: Never Used  Substance Use Topics   Alcohol use: Not Currently    Comment: rarely   Drug use: No     Allergies   Lidocaine, Codeine, Morphine, Niacin, and Niacin and related   Review of Systems Review of Systems: :negative unless otherwise stated in HPI.      Physical Exam Triage Vital Signs ED Triage Vitals  Encounter Vitals Group     BP 05/12/23 1148 (!) 164/89     Systolic BP Percentile --      Diastolic BP Percentile --      Pulse Rate 05/12/23 1148 72     Resp 05/12/23 1148 19     Temp 05/12/23 1148 98.8 F (37.1 C)     Temp Source 05/12/23 1148 Oral     SpO2 05/12/23 1148 96 %     Weight --      Height --      Head Circumference --  Peak Flow --      Pain Score 05/12/23 1146 8     Pain Loc --      Pain Education --      Exclude from Growth Chart --    No data found.  Updated Vital Signs BP (!) 164/89 (BP Location: Right Arm)   Pulse 72   Temp 98.8 F (37.1 C) (Oral)   Resp 19   SpO2 96%   Visual  Acuity Right Eye Distance:   Left Eye Distance:   Bilateral Distance:    Right Eye Near:   Left Eye Near:    Bilateral Near:     Physical Exam GEN: well appearing female in no acute distress  CVS: well perfused  RESP: speaking in full sentences without pause  ABD: soft, non-tender, non-distended, no palpable masses, no CVA tenderness bilaterally   GU: deferred, patient performed self swab  SKIN: warm, dry    UC Treatments / Results  Labs (all labs ordered are listed, but only abnormal results are displayed) Labs Reviewed  URINALYSIS, W/ REFLEX TO CULTURE (INFECTION SUSPECTED) - Abnormal; Notable for the following components:      Result Value   Nitrite POSITIVE (*)    Leukocytes,Ua SMALL (*)    Bacteria, UA MANY (*)    All other components within normal limits  URINE CULTURE    EKG   Radiology No results found.  Procedures Procedures (including critical care time)  Medications Ordered in UC Medications - No data to display  Initial Impression / Assessment and Plan / UC Course  I have reviewed the triage vital signs and the nursing notes.  Pertinent labs & imaging results that were available during my care of the patient were reviewed by me and considered in my medical decision making (see chart for details).     Acute cystitis:  Patient is a 77 y.o. female  who presents for 3-4 weeks of dysuria.  Overall patient is well-appearing and afebrile.  Vital signs stable.  UA consistent with acute cystitis.  Hematuria not supported on microscopy.  Previous urine culture showed E. coli that was pansensitive.  Treat with Macrobid 2 times daily for 7 days.  Urine culture obtained.  Follow-up sensitivities and change antibiotics, if needed. Return precautions including abdominal pain, fever, chills, nausea, or vomiting given.   Follow-up,  if symptoms not improving or getting worse. Discussed MDM, treatment plan and plan for follow-up with patient who agrees with plan.         Final Clinical Impressions(s) / UC Diagnoses   Final diagnoses:  Acute cystitis without hematuria     Discharge Instructions      Stop by the pharmacy to pick up your prescriptions.  Follow up with your primary care provider as needed.  I sent your urine for culture.  Someone may call you to change antibiotics based off its results.  Follow up with a podiatrist about the painful area on your toe      ED Prescriptions     Medication Sig Dispense Auth. Provider   nitrofurantoin, macrocrystal-monohydrate, (MACROBID) 100 MG capsule Take 1 capsule (100 mg total) by mouth 2 (two) times daily for 7 days. 14 capsule Damien Cisar, DO      PDMP not reviewed this encounter.   Katha Cabal, DO 05/12/23 1227

## 2023-05-12 NOTE — Discharge Instructions (Addendum)
Stop by the pharmacy to pick up your prescriptions.  Follow up with your primary care provider as needed.  I sent your urine for culture.  Someone may call you to change antibiotics based off its results.  Follow up with a podiatrist about the painful area on your toe

## 2023-05-12 NOTE — ED Triage Notes (Signed)
Sx x 2 weeks.  Patient states that urine had odor. Burning with urination.   She took AZO that helped as long as she was taking the medication.

## 2023-05-13 LAB — URINE CULTURE: Culture: NO GROWTH

## 2023-05-14 ENCOUNTER — Other Ambulatory Visit: Payer: Self-pay | Admitting: Family Medicine

## 2023-05-14 ENCOUNTER — Other Ambulatory Visit: Payer: Self-pay | Admitting: Surgical

## 2023-05-17 NOTE — Telephone Encounter (Signed)
Duplicate request. Medication was refilled on 05/14/2023.

## 2023-05-25 ENCOUNTER — Other Ambulatory Visit: Payer: Self-pay

## 2023-05-25 DIAGNOSIS — G43809 Other migraine, not intractable, without status migrainosus: Secondary | ICD-10-CM

## 2023-05-25 MED ORDER — SUMATRIPTAN SUCCINATE 50 MG PO TABS: 50.0000 mg | ORAL_TABLET | ORAL | 0 refills | Status: DC | PRN

## 2023-05-31 ENCOUNTER — Encounter: Payer: Self-pay | Admitting: Family Medicine

## 2023-05-31 ENCOUNTER — Ambulatory Visit (INDEPENDENT_AMBULATORY_CARE_PROVIDER_SITE_OTHER): Payer: PPO | Admitting: Family Medicine

## 2023-05-31 VITALS — BP 144/86 | HR 81 | Temp 97.9°F | Ht 64.0 in | Wt 207.0 lb

## 2023-05-31 DIAGNOSIS — M2550 Pain in unspecified joint: Secondary | ICD-10-CM | POA: Diagnosis not present

## 2023-05-31 DIAGNOSIS — I10 Essential (primary) hypertension: Secondary | ICD-10-CM | POA: Diagnosis not present

## 2023-05-31 DIAGNOSIS — R300 Dysuria: Secondary | ICD-10-CM

## 2023-05-31 DIAGNOSIS — E785 Hyperlipidemia, unspecified: Secondary | ICD-10-CM | POA: Diagnosis not present

## 2023-05-31 LAB — BASIC METABOLIC PANEL
BUN: 14 mg/dL (ref 6–23)
CO2: 31 meq/L (ref 19–32)
Calcium: 9.5 mg/dL (ref 8.4–10.5)
Chloride: 99 meq/L (ref 96–112)
Creatinine, Ser: 0.79 mg/dL (ref 0.40–1.20)
GFR: 72.98 mL/min (ref >60.00)
Glucose, Bld: 101 mg/dL — ABNORMAL HIGH (ref 70–99)
Potassium: 4.6 meq/L (ref 3.5–5.1)
Sodium: 141 meq/L (ref 135–145)

## 2023-05-31 LAB — CBC
HCT: 37 % (ref 36.0–46.0)
Hemoglobin: 12 g/dL (ref 12.0–15.0)
MCHC: 32.6 g/dL (ref 30.0–36.0)
MCV: 94.5 fL (ref 78.0–100.0)
Platelets: 260 10*3/uL (ref 150.0–400.0)
RBC: 3.91 Mil/uL (ref 3.87–5.11)
RDW: 15.3 % (ref 11.5–15.5)
WBC: 7.2 10*3/uL (ref 4.0–10.5)

## 2023-05-31 MED ORDER — AMLODIPINE BESYLATE 5 MG PO TABS: 5.0000 mg | ORAL_TABLET | Freq: Every day | ORAL | 1 refills | Status: DC

## 2023-05-31 NOTE — Assessment & Plan Note (Signed)
Chronic issue.  It would appear that the would not be related to infection.  Discussed minimizing use of Azo.  Discussed the potential use of Uribel although up-to-date notes to avoid this in the older adult.  She will be referred to urology.

## 2023-05-31 NOTE — Assessment & Plan Note (Signed)
Chronic issue.  Discussed she could continue tramadol 50 mg every 6 hours as needed for pain.  Discussed that sometimes orthopedics can do surgery with a spinal block though she would need to discuss that option with her surgeon.

## 2023-05-31 NOTE — Patient Instructions (Signed)
Nice to see. We are going to start you on amlodipine for your blood pressure. Will get lab work today. Minimize use of the Azo. Urology should contact you to schedule a visit.

## 2023-05-31 NOTE — Assessment & Plan Note (Signed)
Chronic issue.  She will restart Crestor 40 mg daily.  Advised that the benefit of the Crestor outweighs any small risk of memory deficits from this medication.

## 2023-05-31 NOTE — Assessment & Plan Note (Signed)
Chronic issue.  Above goal.  We will add amlodipine 5 mg daily.  She will monitor for constipation and swelling and if those occur she will let us know.  She will continue HCTZ 25 mg daily and valsartan 320 mg daily.  Check BMP today.

## 2023-05-31 NOTE — Progress Notes (Signed)
Marikay Alar, MD Phone: [phone]  Dana Obrien is a 77 y.o. female who presents today for f/u.  Hypertension: Blood pressures have been between 111-181/68-98 recently.  She increased her valsartan from 160 mg to 320 mg on her own.  She is also on HCTZ.  Dysuria: This has been going on for months.  She thought she had a UTI and went to urgent care.  She noted the culture was negative.  She has been taking Azo twice daily to help with her discomfort.  She reports a history of bladder polyps.  Has not seen urology recently.  Hyperlipidemia: Patient reports she stopped her Crestor given concern for dementia coming from this.  She has been off of this for a few weeks.  Arthralgias: Patient continues to use tramadol only as needed.  She is on Celebrex as well.  She notes she saw pulmonology and they advised that she could not be put to sleep for surgery given her sleep apnea.  Social History   Tobacco Use  Smoking Status Never  Smokeless Tobacco Never    Current Outpatient Medications on File Prior to Visit  Medication Sig Dispense Refill   albuterol (VENTOLIN HFA) 108 (90 Base) MCG/ACT inhaler Inhale 2 puffs into the lungs every 6 (six) hours as needed for wheezing or shortness of breath. 8 g 2   ALPRAZolam (XANAX) 0.25 MG tablet Take 1 tablet by mouth twice daily as needed for anxiety 20 tablet 0   celecoxib (CELEBREX) 100 MG capsule Take 1 capsule by mouth twice daily 60 capsule 0   cetirizine (EQ ALLERGY RELIEF, CETIRIZINE,) 10 MG tablet Take 1 tablet (10 mg total) by mouth daily. 90 tablet 0   EQ ARTHRITIS PAIN RELIEVER 1 % GEL APPLY 2 GRAMS TOPICALLY FOUR TIMES DAILY AS NEEDED 50 g 0   FLUoxetine (PROZAC) 20 MG capsule TAKE 3 CAPSULES BY MOUTH ONCE DAILY 90 capsule 0   fluticasone (FLONASE) 50 MCG/ACT nasal spray Place 1 spray into both nostrils daily. 16 g 0   methocarbamol (ROBAXIN) 500 MG tablet TAKE 1 TABLET BY MOUTH EVERY 8 HOURS AS NEEDED FOR MUSCLE SPASM 30 tablet 0    Multiple Vitamin (MULTI-VITAMIN) tablet Take 1 tablet by mouth daily.      Omega-3 Fatty Acids (FISH OIL PO) Take 1 tablet by mouth daily.     ondansetron (ZOFRAN) 4 MG tablet Take 1 tablet (4 mg total) by mouth every 8 (eight) hours as needed for nausea or vomiting. 20 tablet 0   pantoprazole (PROTONIX) 40 MG tablet Take 1 tablet by mouth once daily 90 tablet 0   rosuvastatin (CRESTOR) 40 MG tablet Take 1 tablet (40 mg total) by mouth daily. 90 tablet 3   senna (SENOKOT) 8.6 MG TABS tablet Take 1 tablet by mouth at bedtime.      SUMAtriptan (IMITREX) 50 MG tablet Take 1 tablet (50 mg total) by mouth every 2 (two) hours as needed for migraine. May repeat in 2 hours if headache persists or recurs. No more than 2 doses in a 24 hour time frame. 10 tablet 0   traMADol (ULTRAM) 50 MG tablet TAKE 1 TABLET BY MOUTH EVERY 6 HOURS AS NEEDED 30 tablet 0   tretinoin (RETIN-A) 0.05 % cream Apply 1 application topically at bedtime. 45 g 0   valsartan (DIOVAN) 320 MG tablet Take 1 tablet (320 mg total) by mouth daily. 90 tablet 3   hydrochlorothiazide (HYDRODIURIL) 25 MG tablet Take 1 tablet (25 mg total) by  mouth daily. 90 tablet 3   No current facility-administered medications on file prior to visit.     ROS see history of present illness  Objective  Physical Exam Vitals:   05/31/23 1136  BP: (!) 144/86  Pulse: 81  Temp: 97.9 F (36.6 C)  SpO2: 94%    BP Readings from Last 3 Encounters:  05/31/23 (!) 144/86  05/12/23 (!) 164/89  03/11/23 127/71   Wt Readings from Last 3 Encounters:  05/31/23 207 lb (93.9 kg)  03/03/23 209 lb 12.8 oz (95.2 kg)  01/14/23 213 lb 3.2 oz (96.7 kg)    Physical Exam Constitutional:      General: She is not in acute distress. Cardiovascular:     Rate and Rhythm: Normal rate and regular rhythm.  Neurological:     Mental Status: She is alert.      Assessment/Plan: Please see individual problem list.  Primary hypertension Assessment &  Plan: Chronic issue.  Above goal.  We will add amlodipine 5 mg daily.  She will monitor for constipation and swelling and if those occur she will let us know.  She will continue HCTZ 25 mg daily and valsartan 320 mg daily.  Check BMP today.  Orders: -     Basic metabolic panel -     amLODIPine Besylate; Take 1 tablet (5 mg total) by mouth daily.  Dispense: 90 tablet; Refill: 1  Dysuria Assessment & Plan: Chronic issue.  It would appear that the would not be related to infection.  Discussed minimizing use of Azo.  Discussed the potential use of Uribel although up-to-date notes to avoid this in the older adult.  She will be referred to urology.  Orders: -     CBC -     Ambulatory referral to Urology -     Basic metabolic panel  Hyperlipidemia, unspecified hyperlipidemia type Assessment & Plan: Chronic issue.  She will restart Crestor 40 mg daily.  Advised that the benefit of the Crestor outweighs any small risk of memory deficits from this medication.   Arthralgia, unspecified joint Assessment & Plan: Chronic issue.  Discussed she could continue tramadol 50 mg every 6 hours as needed for pain.  Discussed that sometimes orthopedics can do surgery with a spinal block though she would need to discuss that option with her surgeon.    Return in about 3 months (around 08/29/2023) for Transfer to Dr. Clent Ridges.   Marikay Alar, MD [facility]

## 2023-06-01 ENCOUNTER — Other Ambulatory Visit: Payer: Self-pay | Admitting: Family

## 2023-06-01 ENCOUNTER — Other Ambulatory Visit: Payer: Self-pay | Admitting: Family Medicine

## 2023-06-11 ENCOUNTER — Telehealth: Payer: Self-pay | Admitting: Family Medicine

## 2023-06-11 NOTE — Telephone Encounter (Signed)
 Copied from CRM [id number]. Topic: Clinical - Medication Refill >> Jun 11, 2023  3:04 PM Leotis ORN wrote: Most Recent Primary Care Visit:  Provider: MARIBETH ERIC G  Department: [facility]  Visit Type: OFFICE VISIT  Date: 05/31/2023  Medication: ***  Has the patient contacted their pharmacy?  (Agent: If no, request that the patient contact the pharmacy for the refill. If patient does not wish to contact the pharmacy document the reason why and proceed with request.) (Agent: If yes, when and what did the pharmacy advise?)  Is this the correct pharmacy for this prescription?  If no, delete pharmacy and type the correct one.  This is the patient's preferred pharmacy:  [facility] [address], [place] - [address] [place] [address] Phone: [phone] Fax: [phone]   Has the prescription been filled recently?   Is the patient out of the medication?   Has the patient been seen for an appointment in the last year OR does the patient have an upcoming appointment?   Can we respond through MyChart?   Agent: Please be advised that Rx refills may take up to 3 business days. We ask that you follow-up with your pharmacy.

## 2023-06-14 ENCOUNTER — Telehealth: Payer: Self-pay | Admitting: Family Medicine

## 2023-06-14 ENCOUNTER — Ambulatory Visit: Payer: Self-pay | Admitting: Family Medicine

## 2023-06-14 MED ORDER — ALPRAZOLAM 0.25 MG PO TABS: 0.2500 mg | ORAL_TABLET | Freq: Two times a day (BID) | ORAL | 0 refills | Status: DC | PRN

## 2023-06-14 NOTE — Telephone Encounter (Signed)
 Pt would like refill request for zanex  Answer Assessment - Initial Assessment Questions 1. REASON FOR CALL or QUESTION: What is your reason for calling today? or How can I best help you? or What question do you have that I can help answer?     Patient would like refill on xanex: pt stated uses medication for chronic anxiety: no no issues, s/s.  Nurse verified pharmacy still same: pt stated yes.  Protocols used: Information Only Call - No Triage-A-AH

## 2023-06-14 NOTE — Telephone Encounter (Signed)
 Copied from CRM [id number]. Topic: Medicare AWV >> Jun 14, 2023 10:17 AM Nathanel DEL wrote: Reason for CRM: Called LVM 06/14/2023 to schedule AWV. Please schedule office or virtual visits  Nathanel Paschal; Care Guide Ambulatory Clinical Support Viola l [facility] Direct Dial: [phone]

## 2023-06-14 NOTE — Telephone Encounter (Signed)
 Attempted to call pt back r/t medication refill request: no answer left message: requested callback to [facility] @ [employer] office at [phone].  Will attempt to reach out to pt again.

## 2023-06-14 NOTE — Telephone Encounter (Signed)
 Called and spoke with patient: see nurse triage noted 06/14/23

## 2023-06-14 NOTE — Telephone Encounter (Signed)
 Sent to pharmacy

## 2023-06-15 NOTE — Telephone Encounter (Signed)
 Patient is aware that medication was sent to the Pharmacy.

## 2023-06-21 ENCOUNTER — Telehealth: Payer: Self-pay | Admitting: Family Medicine

## 2023-06-21 NOTE — Telephone Encounter (Signed)
LMTCB. Need to let pt know that she was referral to [facility]. The referral does not state a specific provider. Pt can give their office a call to see about scheduling the appt if not already done.

## 2023-06-21 NOTE — Telephone Encounter (Signed)
Copied from CRM [id number]. Topic: Referral - Question >> Jun 21, 2023 10:52 AM Isabell A wrote: Reason for CRM: Patient states she was referred to a urologist, but she's not sure with which provider. Patient is wanting the providers information.

## 2023-06-24 NOTE — Telephone Encounter (Signed)
Pt is scheduled with urology appt was made on 06/21/23 at 2:09pm

## 2023-07-06 ENCOUNTER — Ambulatory Visit: Payer: PPO | Admitting: Urology

## 2023-07-07 ENCOUNTER — Other Ambulatory Visit: Payer: Self-pay | Admitting: Family Medicine

## 2023-07-28 ENCOUNTER — Other Ambulatory Visit: Payer: Self-pay | Admitting: Family Medicine

## 2023-07-29 ENCOUNTER — Ambulatory Visit: Payer: PPO | Admitting: Urology

## 2023-07-29 VITALS — BP 118/73 | HR 73 | Ht 64.0 in | Wt 214.4 lb

## 2023-07-29 DIAGNOSIS — R300 Dysuria: Secondary | ICD-10-CM | POA: Diagnosis not present

## 2023-07-29 DIAGNOSIS — N3010 Interstitial cystitis (chronic) without hematuria: Secondary | ICD-10-CM | POA: Diagnosis not present

## 2023-07-29 DIAGNOSIS — R350 Frequency of micturition: Secondary | ICD-10-CM

## 2023-07-29 LAB — URINALYSIS, COMPLETE
Bilirubin, UA: NEGATIVE
Glucose, UA: NEGATIVE
Ketones, UA: NEGATIVE
Nitrite, UA: NEGATIVE
Protein,UA: NEGATIVE
RBC, UA: NEGATIVE
Specific Gravity, UA: 1.02 (ref 1.005–1.030)
Urobilinogen, Ur: 0.2 mg/dL (ref 0.2–1.0)
pH, UA: 7.5 (ref 5.0–7.5)

## 2023-07-29 LAB — MICROSCOPIC EXAMINATION

## 2023-07-29 LAB — BLADDER SCAN AMB NON-IMAGING: Scan Result: 42

## 2023-07-29 NOTE — Progress Notes (Signed)
 Dana Obrien,acting as a scribe for Vanna Scotland, MD.,have documented all relevant documentation on the behalf of Vanna Scotland, MD,as directed by  Vanna Scotland, MD while in the presence of Vanna Scotland, MD.  07/29/23 12:13 PM   Dana Obrien [birth year] [id number]  Referring provider: Glori Luis, MD [address] [place],  [place] [address]  Chief Complaint  Patient presents with   Establish Care   Dysuria    HPI:  78 y/o female who presents today for further evaluation of dysuria. She was seen in the emergency room in December 2024 with intermittent dysuria over the past month. She had been taking Azo intermittently, which provided relief. Her urinalysis was nitrate positive, presumably related to Azo use, with a few WBCs (6-10) and no microscopic hematuria. The urine culture was negative.  Her urinalysis today is similar with 6-10 WBC but is otherwise negative.  Around Thanksgiving, she experienced a pin-like pain in her bladder, described as a burning, prickly sensation. She was treated with antibiotics for a suspected UTI, but the symptoms persisted. The pain is more noticeable after drinking coffee, and she has reduced her coffee intake. The pain is located in the pelvic area, internally, and is not associated with urination.  She experiences urgency and takes Hydrochlorothiazide and a blood pressure medication with a diuretic.  She had bladder repair in 2017.   She also had a vaginal hysterectomy.   She has a personal history of bladder polyps diagnosed in her twenties.   She has a history of breast cancer in 2004 and 2019, with stage one in the left breast and stage three in the right breast, treated with chemotherapy, radiation, and lumpectomy.  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   Cancer [facility]) 2004   right lumpectomy, chemotherapy and radiation Dr. Koleen Nimrod   CPAP (continuous positive airway pressure) dependence    Depression    GERD (gastroesophageal reflux disease)    Hypertension    Migraine    Personal history of chemotherapy 2004   Personal history of radiation therapy 2004   Personal history of radiation therapy 2019   PONV (postoperative nausea and vomiting)    Shingles    Sleep apnea    uses BiPAP, severe OSA   Vitamin D deficiency    IN THE PAST    Surgical History: Past Surgical History:  Procedure Laterality Date   ABDOMINAL HYSTERECTOMY     ANTERIOR AND POSTERIOR REPAIR     BREAST BIOPSY Right 2004   stereo. 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  3.5 cm maximum diameter, minimal margins 5 mm.  In situ component less than 10%.   BREAST LUMPECTOMY Right 2004   BREAST LUMPECTOMY Left 09/17/2017   Procedure: BREAST EXCISION, SENTINEL NODE BIOPSY;  Surgeon: Lemar Livings,  Merrily Pew, MD;  Location: [facility] ORS;  Service: General;  Laterality: Left;   COLONOSCOPY  2015   COLONOSCOPY WITH PROPOFOL N/A 10/04/2019   Procedure: COLONOSCOPY WITH PROPOFOL;  Surgeon: Earline Mayotte, MD;  Location: [facility] ENDOSCOPY;  Service: Endoscopy;  Laterality: N/A;   DILATION AND CURETTAGE OF UTERUS     ETHMOIDECTOMY Bilateral 11/05/2016   Procedure: ETHMOIDECTOMY;  Surgeon: Christia Reading, MD;  Location: [facility];  Service: ENT;  Laterality: Bilateral;   LAPAROTOMY     MAXILLARY ANTROSTOMY Bilateral 11/05/2016    Procedure: MAXILLARY ANTROSTOMY;  Surgeon: Christia Reading, MD;  Location: [facility] OR;  Service: ENT;  Laterality: Bilateral;   SINUS ENDO W/FUSION Bilateral 11/05/2016   Procedure: FRONTAL RECESS EXPLORATION;  Surgeon: Christia Reading, MD;  Location: [facility] OR;  Service: ENT;  Laterality: Bilateral;  BILATERAL ENDOSCOPIC SINUS SURGERY WITH FUSION   SINUS EXPLORATION  11/05/2016   SPHENOIDECTOMY Bilateral 11/05/2016   Procedure: SPHENOIDECTOMY;  Surgeon: Christia Reading, MD;  Location: [facility] OR;  Service: ENT;  Laterality: Bilateral;    Home Medications:  Allergies as of 07/29/2023       Reactions   Lidocaine Palpitations   Codeine Nausea Only, Nausea And Vomiting   Morphine Nausea Only   Niacin Rash   Niacin And Related Rash   ANTIHYPERLIPEMICS        Medication List        Accurate as of July 29, 2023 12:13 PM. If you have any questions, ask your nurse or doctor.          albuterol 108 (90 Base) MCG/ACT inhaler Commonly known as: VENTOLIN HFA Inhale 2 puffs into the lungs every 6 (six) hours as needed for wheezing or shortness of breath.   ALPRAZolam 0.25 MG tablet Commonly known as: XANAX Take 1 tablet (0.25 mg total) by mouth 2 (two) times daily as needed. for anxiety   amLODipine 5 MG tablet Commonly known as: NORVASC Take 1 tablet (5 mg total) by mouth daily.   celecoxib 100 MG capsule Commonly known as: CELEBREX Take 1 capsule by mouth twice daily   cetirizine 10 MG tablet Commonly known as: EQ Allergy Relief (Cetirizine) Take 1 tablet (10 mg total) by mouth daily.   EQ Arthritis Pain Reliever 1 % Gel Generic drug: diclofenac Sodium APPLY 2 GRAMS TOPICALLY FOUR TIMES DAILY AS NEEDED   FISH OIL PO Take 1 tablet by mouth daily.   FLUoxetine 20 MG capsule Commonly known as: PROZAC TAKE 3 CAPSULES BY MOUTH ONCE DAILY   fluticasone 50 MCG/ACT nasal spray Commonly known as: FLONASE Place 1 spray into both nostrils daily.   hydrochlorothiazide 25 MG tablet Commonly  known as: HYDRODIURIL Take 1 tablet (25 mg total) by mouth daily.   methocarbamol 500 MG tablet Commonly known as: ROBAXIN TAKE 1 TABLET BY MOUTH EVERY 8 HOURS AS NEEDED FOR MUSCLE SPASM   Multi-Vitamin tablet Take 1 tablet by mouth daily.   ondansetron 4 MG tablet Commonly known as: Zofran Take 1 tablet (4 mg total) by mouth every 8 (eight) hours as needed for nausea or vomiting.   pantoprazole 40 MG tablet Commonly known as: PROTONIX Take 1 tablet by mouth once daily   rosuvastatin 40 MG tablet Commonly known as: CRESTOR Take 1 tablet (40 mg total) by mouth daily.   senna 8.6 MG Tabs tablet Commonly known as: SENOKOT Take 1 tablet by mouth at bedtime.   SUMAtriptan 50 MG tablet Commonly known as: Imitrex Take 1 tablet (50 mg total) by  mouth every 2 (two) hours as needed for migraine. May repeat in 2 hours if headache persists or recurs. No more than 2 doses in a 24 hour time frame.   traMADol 50 MG tablet Commonly known as: ULTRAM TAKE 1 TABLET BY MOUTH EVERY 6 HOURS AS NEEDED   tretinoin 0.05 % cream Commonly known as: RETIN-A Apply 1 application topically at bedtime.   valsartan 320 MG tablet Commonly known as: DIOVAN Take 1 tablet (320 mg total) by mouth daily.        Allergies:  Allergies  Allergen Reactions   Lidocaine Palpitations   Codeine Nausea Only and Nausea And Vomiting   Morphine Nausea Only   Niacin Rash   Niacin And Related Rash    ANTIHYPERLIPEMICS    Family History: Family History  Problem Relation Age of Onset   Heart failure Mother    Heart disease Mother    Hyperlipidemia Mother    Hypertension Mother    Heart murmur Mother    Hypertension Father    Osteoarthritis Father    Stroke Father    Alzheimer's disease Sister    Alzheimer's disease Brother    Brain cancer Brother    Healthy Daughter    Healthy Son    Breast cancer Neg Hx     Social History:  reports that she has never smoked. She has never used smokeless  tobacco. She reports that she does not currently use alcohol. She reports that she does not use drugs.   Physical Exam: BP 118/73   Pulse 73   Ht 5\' 4"  (1.626 m)   Wt 214 lb 6 oz (97.2 kg)   BMI 36.80 kg/m   Constitutional:  Alert and oriented, No acute distress. HEENT: [place] AT, moist mucus membranes.  Trachea midline, no masses. Neurologic: Grossly intact, no focal deficits, moving all 4 extremities. Psychiatric: Normal mood and affect.   Assessment & Plan:    1. Dysuria/ bladder pain - She presents with dysuria and bladder pain, with a history of intermittent symptoms.  - Urinalysis shows a few WBCs but no significant infection.  - Differential diagnosis includes interstitial cystitis and bladder cancer, though the latter is considered unlikely due to the absence of hematuria.  - Consider topical estrogen cream to address potential genitourinary syndrome of menopause, with a follow-up cystoscopy and pelvic exam to rule out bladder cancer and assess for any anatomical issues. - Coordination with the her oncologist, Dr. Smith Robert will be maintained regarding the use of topical estrogen.  2. Interstitial Cystitis (IC) - Advised to avoid potential dietary triggers such as coffee, tea, and acidic foods.  - Educational materials on IC will be provided.  3. Stress incontinence - Likely related to previous bladder repair and cystocele.  - No immediate changes to management, but this will be monitored.  Return in about 6 weeks (around 09/09/2023) for cystoscopy and pelvic exam to further evaluate bladder pain and rule out serious conditions.   [facility] [address], [address] [place], [place] [address] [phone]

## 2023-07-29 NOTE — Patient Instructions (Addendum)

## 2023-08-02 ENCOUNTER — Telehealth: Payer: Self-pay | Admitting: Family Medicine

## 2023-08-02 NOTE — Telephone Encounter (Signed)
 Copied from CRM [id number]. Topic: Medicare AWV >> Aug 02, 2023 11:50 AM Payton Doughty wrote: Reason for CRM: Called LVM 08/02/2023 to schedule AWV. Please schedule office or virtual visits  Verlee Rossetti; Care Guide Ambulatory Clinical Support [facility] Direct Dial: [phone]

## 2023-08-27 ENCOUNTER — Other Ambulatory Visit: Payer: Self-pay | Admitting: Surgical

## 2023-08-27 ENCOUNTER — Other Ambulatory Visit: Payer: Self-pay | Admitting: Physician Assistant

## 2023-08-27 MED ORDER — METHOCARBAMOL 500 MG PO TABS: 500.0000 mg | ORAL_TABLET | Freq: Three times a day (TID) | ORAL | 0 refills | Status: DC | PRN

## 2023-08-31 ENCOUNTER — Encounter: Payer: Self-pay | Admitting: Family Medicine

## 2023-08-31 ENCOUNTER — Ambulatory Visit: Payer: PPO | Admitting: Family Medicine

## 2023-08-31 VITALS — BP 126/78 | HR 73 | Temp 98.4°F | Resp 20 | Ht 64.0 in | Wt 216.0 lb

## 2023-08-31 DIAGNOSIS — I10 Essential (primary) hypertension: Secondary | ICD-10-CM

## 2023-08-31 DIAGNOSIS — J309 Allergic rhinitis, unspecified: Secondary | ICD-10-CM

## 2023-08-31 DIAGNOSIS — M51369 Other intervertebral disc degeneration, lumbar region without mention of lumbar back pain or lower extremity pain: Secondary | ICD-10-CM | POA: Diagnosis not present

## 2023-08-31 DIAGNOSIS — E785 Hyperlipidemia, unspecified: Secondary | ICD-10-CM | POA: Diagnosis not present

## 2023-08-31 DIAGNOSIS — F39 Unspecified mood [affective] disorder: Secondary | ICD-10-CM

## 2023-08-31 DIAGNOSIS — G43809 Other migraine, not intractable, without status migrainosus: Secondary | ICD-10-CM | POA: Diagnosis not present

## 2023-08-31 DIAGNOSIS — R6889 Other general symptoms and signs: Secondary | ICD-10-CM

## 2023-08-31 DIAGNOSIS — M899 Disorder of bone, unspecified: Secondary | ICD-10-CM

## 2023-08-31 LAB — COMPREHENSIVE METABOLIC PANEL WITH GFR
ALT: 28 U/L (ref 0–35)
AST: 26 U/L (ref 0–37)
Albumin: 4.2 g/dL (ref 3.5–5.2)
Alkaline Phosphatase: 57 U/L (ref 39–117)
BUN: 20 mg/dL (ref 6–23)
CO2: 31 meq/L (ref 19–32)
Calcium: 9.5 mg/dL (ref 8.4–10.5)
Chloride: 101 meq/L (ref 96–112)
Creatinine, Ser: 0.73 mg/dL (ref 0.40–1.20)
GFR: 80.1 mL/min (ref >60.00)
Glucose, Bld: 96 mg/dL (ref 70–99)
Potassium: 4.2 meq/L (ref 3.5–5.1)
Sodium: 140 meq/L (ref 135–145)
Total Bilirubin: 0.4 mg/dL (ref 0.2–1.2)
Total Protein: 7.4 g/dL (ref 6.0–8.3)

## 2023-08-31 MED ORDER — CELECOXIB 100 MG PO CAPS: 100.0000 mg | ORAL_CAPSULE | Freq: Two times a day (BID) | ORAL | 3 refills | Status: DC

## 2023-08-31 MED ORDER — FLUOXETINE HCL 20 MG PO CAPS: 60.0000 mg | ORAL_CAPSULE | Freq: Every day | ORAL | 3 refills | Status: AC

## 2023-08-31 MED ORDER — VALSARTAN 320 MG PO TABS
320.0000 mg | ORAL_TABLET | Freq: Every day | ORAL | 3 refills | Status: AC
Start: 2023-08-31 — End: ?

## 2023-08-31 MED ORDER — AMLODIPINE BESYLATE 5 MG PO TABS: 5.0000 mg | ORAL_TABLET | Freq: Every day | ORAL | 3 refills | Status: AC

## 2023-08-31 MED ORDER — SUMATRIPTAN SUCCINATE 50 MG PO TABS
50.0000 mg | ORAL_TABLET | ORAL | 2 refills | Status: AC | PRN
Start: 2023-08-31 — End: ?

## 2023-08-31 MED ORDER — CETIRIZINE HCL 10 MG PO TABS
10.0000 mg | ORAL_TABLET | Freq: Every day | ORAL | 3 refills | Status: AC
Start: 2023-08-31 — End: ?

## 2023-08-31 MED ORDER — TRAMADOL HCL 50 MG PO TABS: 50.0000 mg | ORAL_TABLET | Freq: Four times a day (QID) | ORAL | 0 refills | Status: DC | PRN

## 2023-08-31 MED ORDER — ROSUVASTATIN CALCIUM 40 MG PO TABS: 40.0000 mg | ORAL_TABLET | Freq: Every day | ORAL | 3 refills | Status: AC

## 2023-08-31 MED ORDER — PANTOPRAZOLE SODIUM 40 MG PO TBEC: 40.0000 mg | DELAYED_RELEASE_TABLET | Freq: Every day | ORAL | 3 refills | Status: AC

## 2023-08-31 MED ORDER — FLUTICASONE PROPIONATE 50 MCG/ACT NA SUSP
1.0000 | Freq: Every day | NASAL | 0 refills | Status: DC
Start: 2023-08-31 — End: 2024-02-17

## 2023-08-31 NOTE — Patient Instructions (Addendum)
 It was a pleasure meeting you today. Thank you for allowing me to take part in your health care.  Our goals for today as we discussed include:  We will get some labs today.  If they are abnormal or we need to do something about them, I will call you.  If they are normal, I will send you a message on MyChart (if it is active) or a letter in the mail.  If you don't hear from [place] in 2 weeks, please call the office at the number below.   Refills sent for requested medications   This is a list of the screening recommended for you and due dates:  Health Maintenance  Topic Date Due   Zoster (Shingles) Vaccine (1 of 2) Never done   Medicare Annual Wellness Visit  03/13/2023   COVID-19 Vaccine (3 - Pfizer risk series) 09/16/2023*   Flu Shot  12/31/2023   Colon Cancer Screening  10/03/2024   DTaP/Tdap/Td vaccine (4 - Td or Tdap) 12/16/2032   Pneumonia Vaccine  Completed   DEXA scan (bone density measurement)  Completed   Hepatitis C Screening  Completed   HPV Vaccine  Aged Out  *Topic was postponed. The date shown is not the original due date.       If you have any questions or concerns, please do not hesitate to call the office at [phone].  I look forward to our next visit and until then take care and stay safe.  Regards,   Dana Allan, MD   [facility]

## 2023-08-31 NOTE — Progress Notes (Signed)
 SUBJECTIVE:   Chief Complaint  Patient presents with   Hypertension    Follow up   HPI Presents for follow up chronic disease management  Discussed the use of AI scribe software for clinical note transcription with the patient, who gave verbal consent to proceed.  History of Present Illness Dana Obrien "Boneta Lucks" is a 78 year old female with hypertension who presents for follow-up on blood pressure management.  Her blood pressure is currently well-controlled with recent readings around 126/78 mmHg, previously as high as 180/100 mmHg. She is on hydrochlorothiazide and amlodipine for management. She experiences fluid retention and has been on hydrochlorothiazide for a couple of years, with no swelling from amlodipine. No history of diabetes, kidney disease, heart attack, or stroke. She sees a urologist for bladder polyps but reports no kidney issues. Echocardiogram and CT scan of her heart were normal.  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 She uses Celebrex for arthritis pain and tramadol for severe pain days, taken every few days. She has received injections in her back and knees, with the last back injection in March of the previous year and knee injections in February. She has not seen a pain management specialist and has not tried Tylenol for pain management. Pain affects her mood and ability to perform daily activities.  She has a history of breast cancer, which she has survived twice. She underwent surgery where a tumor the size of an orange was removed from her chest wall, resulting in a flattened breast. She does not have any current breast issues.  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 She has gained weight, which she believes contributes to her pain and mobility issues.    PERTINENT PMH / PSH: As above  OBJECTIVE:  BP 126/78   Pulse 73   Temp 98.4  F (36.9 C)   Resp 20   Ht 5\' 4"  (1.626 m)   Wt 216 lb (98 kg)   SpO2 98%   BMI 37.08 kg/m    Physical Exam Vitals reviewed.  Constitutional:      General: She is not in acute distress.    Appearance: Normal appearance. She is obese. She is not ill-appearing, toxic-appearing or diaphoretic.  Eyes:     General:        Right eye: No discharge.        Left eye: No discharge.     Conjunctiva/sclera: Conjunctivae normal.  Cardiovascular:     Rate and Rhythm: Normal rate and regular rhythm.     Heart sounds: Normal heart sounds.  Pulmonary:     Effort: Pulmonary effort is normal.     Breath sounds: Normal breath sounds.  Abdominal:     General: Bowel sounds are normal.  Musculoskeletal:        General: Normal range of motion.  Skin:    General: Skin is warm and dry.  Neurological:     General: No focal deficit present.     Mental Status: She is alert and oriented to person, place, and time. Mental status is at baseline.  Psychiatric:        Mood and Affect: Mood normal.        Behavior: Behavior normal.        Thought Content: Thought content normal.        Judgment: Judgment normal.  08/31/2023   12:57 PM 05/31/2023   11:48 AM 03/03/2023    1:43 PM 11/04/2022    9:27 AM 10/27/2022   11:17 AM  Depression screen PHQ 2/9  Decreased Interest 1 0 1 0 1  Down, Depressed, Hopeless 0 0 1 0 1  PHQ - 2 Score 1 0 2 0 2  Altered sleeping 1 0 1 0 0  Tired, decreased energy 1 0 1 0 2  Change in appetite 2 0 3 0 2  Feeling bad or failure about yourself  1 0 2 0 2  Trouble concentrating 0 0 0 0 0  Moving slowly or fidgety/restless 1 0 0 0 0  Suicidal thoughts 0 0 0 0 0  PHQ-9 Score 7 0 9 0 8  Difficult doing work/chores Somewhat difficult Not difficult at all Somewhat difficult Not difficult at all Not difficult at all      08/31/2023   12:58 PM 05/31/2023   11:48 AM 03/03/2023    1:44 PM 11/04/2022    9:27 AM  GAD 7 : Generalized Anxiety Score  Nervous, Anxious, on  Edge 1 0 1 0  Control/stop worrying 1 0 1 0  Worry too much - different things 1 0 2 0  Trouble relaxing 1 0 1 0  Restless 0 0 0 0  Easily annoyed or irritable 1 0 0 0  Afraid - awful might happen 1 0 0 0  Total GAD 7 Score 6 0 5 0  Anxiety Difficulty Not difficult at all Not difficult at all Somewhat difficult Not difficult at all    ASSESSMENT/PLAN:  Primary hypertension Assessment & Plan: Hypertension is well-controlled with current medications. Blood pressure today is 126/78 mmHg, within the target range of less than 140/90 mmHg. She is on three antihypertensive medications, including hydrochlorothiazide and amlodipine. No history of diabetes, kidney disease, myocardial infarction, or cerebrovascular accident. She sees a cardiologist but does not require regular follow-ups unless needed. The current regimen is effective, and there is no indication to alter it at this time. - Continue current antihypertensive medications including hydrochlorothiazide and amlodipine - Monitor blood pressure regularly - Check kidney function today  Orders: -     amLODIPine Besylate; Take 1 tablet (5 mg total) by mouth daily.  Dispense: 90 tablet; Refill: 3 -     Valsartan; Take 1 tablet (320 mg total) by mouth daily.  Dispense: 90 tablet; Refill: 3 -     Comprehensive metabolic panel with GFR  Other migraine without status migrainosus, not intractable Assessment & Plan: Refill Sumatriptan  Orders: -     SUMAtriptan Succinate; Take 1 tablet (50 mg total) by mouth every 2 (two) hours as needed for migraine. May repeat in 2 hours if headache persists or recurs. No more than 2 doses in a 24 hour time frame.  Dispense: 10 tablet; Refill: 2  Hyperlipidemia, unspecified hyperlipidemia type Assessment & Plan: Refill Crestor  Orders: -     Rosuvastatin Calcium; Take 1 tablet (40 mg total) by mouth daily.  Dispense: 90 tablet; Refill: 3  Degeneration of intervertebral disc of lumbar region, unspecified  whether pain present Assessment & Plan: Degenerative bone disease in the back, inherited from her father, with associated arthritis in hands, knees, and feet. Significant pain impacts daily function and mood. Currently using Celebrex and tramadol for pain management. Tramadol is used sparingly but is not always effective. Injections in the back and knees have been helpful. She has not tried physical therapy or regular  use of acetaminophen. Discussed potential benefits of physical therapy and regular acetaminophen use. Zanaflex was suggested as an alternative to Robaxin for muscle relaxation. Encouraged weight loss and regular walking to alleviate joint pain. - Consider referral to physical therapy for back exercises and pain management - Recommend trial of regular acetaminophen (650 mg, two in the morning and two in the evening) for arthritis pain - Continue Celebrex for arthritis - Consider Zanaflex as an alternative to Robaxin for muscle relaxation - Encourage weight loss and regular walking to alleviate joint pain  Orders: -     Celecoxib; Take 1 capsule (100 mg total) by mouth 2 (two) times daily.  Dispense: 180 capsule; Refill: 3 -     traMADol HCl; Take 1 tablet (50 mg total) by mouth every 6 (six) hours as needed.  Dispense: 30 tablet; Refill: 0 -     Pantoprazole Sodium; Take 1 tablet (40 mg total) by mouth daily.  Dispense: 90 tablet; Refill: 3  Allergic rhinitis, unspecified seasonality, unspecified trigger -     Cetirizine HCl; Take 1 tablet (10 mg total) by mouth daily.  Dispense: 90 tablet; Refill: 3 -     Fluticasone Propionate; Place 1 spray into both nostrils daily.  Dispense: 16 g; Refill: 0  Mood disorder (HCC) Assessment & Plan: Chronic Refill Prozac  Orders: -     FLUoxetine HCl; Take 3 capsules (60 mg total) by mouth daily.  Dispense: 270 capsule; Refill: 3    PDMP reviewed  Return if symptoms worsen or fail to improve, for PCP.  Dana Allan, MD

## 2023-09-05 ENCOUNTER — Encounter: Payer: Self-pay | Admitting: Family Medicine

## 2023-09-05 DIAGNOSIS — G43909 Migraine, unspecified, not intractable, without status migrainosus: Secondary | ICD-10-CM | POA: Insufficient documentation

## 2023-09-05 DIAGNOSIS — F39 Unspecified mood [affective] disorder: Secondary | ICD-10-CM | POA: Insufficient documentation

## 2023-09-05 NOTE — Assessment & Plan Note (Signed)
 Refill Crestor

## 2023-09-05 NOTE — Assessment & Plan Note (Signed)
 Hypertension is well-controlled with current medications. Blood pressure today is 126/78 mmHg, within the target range of less than 140/90 mmHg. She is on three antihypertensive medications, including hydrochlorothiazide and amlodipine. No history of diabetes, kidney disease, myocardial infarction, or cerebrovascular accident. She sees a cardiologist but does not require regular follow-ups unless needed. The current regimen is effective, and there is no indication to alter it at this time. - Continue current antihypertensive medications including hydrochlorothiazide and amlodipine - Monitor blood pressure regularly - Check kidney function today

## 2023-09-05 NOTE — Assessment & Plan Note (Signed)
Refill Sumatriptan 

## 2023-09-05 NOTE — Assessment & Plan Note (Signed)
 Degenerative bone disease in the back, inherited from her father, with associated arthritis in hands, knees, and feet. Significant pain impacts daily function and mood. Currently using Celebrex and tramadol for pain management. Tramadol is used sparingly but is not always effective. Injections in the back and knees have been helpful. She has not tried physical therapy or regular use of acetaminophen. Discussed potential benefits of physical therapy and regular acetaminophen use. Zanaflex was suggested as an alternative to Robaxin for muscle relaxation. Encouraged weight loss and regular walking to alleviate joint pain. - Consider referral to physical therapy for back exercises and pain management - Recommend trial of regular acetaminophen (650 mg, two in the morning and two in the evening) for arthritis pain - Continue Celebrex for arthritis - Consider Zanaflex as an alternative to Robaxin for muscle relaxation - Encourage weight loss and regular walking to alleviate joint pain

## 2023-09-05 NOTE — Assessment & Plan Note (Signed)
 Chronic Refill Prozac

## 2023-09-14 ENCOUNTER — Other Ambulatory Visit: Payer: Self-pay

## 2023-09-15 MED ORDER — ALPRAZOLAM 0.25 MG PO TABS: 0.2500 mg | ORAL_TABLET | Freq: Two times a day (BID) | ORAL | 0 refills | Status: DC | PRN

## 2023-09-28 ENCOUNTER — Other Ambulatory Visit: Payer: PPO | Admitting: Urology

## 2023-10-04 ENCOUNTER — Other Ambulatory Visit: Payer: Self-pay | Admitting: Family Medicine

## 2023-10-04 DIAGNOSIS — F39 Unspecified mood [affective] disorder: Secondary | ICD-10-CM

## 2023-10-04 NOTE — Telephone Encounter (Signed)
 Copied from CRM [id number]. Topic: Clinical - Medication Refill >> Oct 04, 2023 11:07 AM Danae Duncans wrote: Most Recent Primary Care Visit:  Provider: WALSH, TANYA  Department: [facility]  Visit Type: OFFICE VISIT  Date: 08/31/2023  Medication: FLUoxetine  (PROZAC ) 20 MG capsule  Has the patient contacted their pharmacy? Yes (Agent: If no, request that the patient contact the pharmacy for the refill. If patient does not wish to contact the pharmacy document the reason why and proceed with request.) (Agent: If yes, when and what did the pharmacy advise?) [facility] refills  Is this the correct pharmacy for this prescription? Yes If no, delete pharmacy and type the correct one.  This is the patient's preferred pharmacy:  [facility] [address], [place] - [address] [place] [address] Phone: [phone] Fax: [phone]   Has the prescription been filled recently? No  Is the patient out of the medication? Yes- has been out since end of last week  Has the patient been seen for an appointment in the last year OR does the patient have an upcoming appointment? Yes  Can we respond through MyChart? Yes  Agent: Please be advised that Rx refills may take up to 3 business days. We ask that you follow-up with your pharmacy.

## 2023-10-06 ENCOUNTER — Other Ambulatory Visit: Payer: Self-pay | Admitting: Family Medicine

## 2023-10-06 NOTE — Telephone Encounter (Signed)
 Ordered on 08/31/23 with confirmed receipt  Copied from CRM #[id number]. Topic: Clinical - Medication Refill >> Oct 04, 2023 11:07 AM Danae Duncans wrote: Most Recent Primary Care Visit:  Provider: WALSH, TANYA  Department: [facility]  Visit Type: OFFICE VISIT  Date: 08/31/2023  Medication: FLUoxetine  (PROZAC ) 20 MG capsule  Has the patient contacted their pharmacy? Yes (Agent: If no, request that the patient contact the pharmacy for the refill. If patient does not wish to contact the pharmacy document the reason why and proceed with request.) (Agent: If yes, when and what did the pharmacy advise?) [facility] refills  Is this the correct pharmacy for this prescription? Yes If no, delete pharmacy and type the correct one.  This is the patient's preferred pharmacy:  [facility] [address] [place] - [address] [place] [address] Phone: [phone] Fax: [phone]   Has the prescription been filled recently? No  Is the patient out of the medication? Yes- has been out since end of last week  Has the patient been seen for an appointment in the last year OR does the patient have an upcoming appointment? Yes  Can we respond through MyChart? Yes  Agent: Please be advised that Rx refills may take up to 3 business days. We ask that you follow-up with your pharmacy. >> Oct 06, 2023  1:30 PM Chuck Crater wrote: Patient stated that Pharmacy advised her that they don't have an order or refills for patient. Patient has been out almost a week.

## 2023-10-07 ENCOUNTER — Telehealth: Payer: Self-pay | Admitting: Family Medicine

## 2023-10-07 NOTE — Telephone Encounter (Signed)
 Called pt and left a vm to  advised her that I called they pharmacy and there is refills on file. They are filling it today for her.

## 2023-10-07 NOTE — Telephone Encounter (Signed)
 Noted.

## 2023-10-07 NOTE — Telephone Encounter (Signed)
 Copied from CRM [id number]. Topic: Clinical - Medication Refill >> Oct 07, 2023  2:24 PM Dana Obrien wrote: Patient called regarding medication Prozac  and was worried that it has not been fill yet because it has been Obrien week since she last took it. Apologized to patient about the refill process time. Patient said she will wait until tomorrow

## 2023-10-07 NOTE — Telephone Encounter (Signed)
 Copied from CRM [id number]. Topic: General - Other >> Oct 07, 2023  3:07 PM Adonis Hoot wrote: Reason for CRM: Patient returned call to [facility] ans stated that she found her medications. She said that it was tucked away somewhere but she found it

## 2023-10-27 ENCOUNTER — Ambulatory Visit: Admitting: Physician Assistant

## 2023-11-05 ENCOUNTER — Encounter: Payer: Self-pay | Admitting: Cardiovascular Disease

## 2023-12-02 ENCOUNTER — Ambulatory Visit: Admitting: Nurse Practitioner

## 2023-12-08 ENCOUNTER — Telehealth: Payer: Self-pay

## 2023-12-08 NOTE — Telephone Encounter (Signed)
 Copied from CRM [id number]. Topic: Clinical - Medication Refill >> Dec 08, 2023 11:58 AM Chiquita SQUIBB wrote: Medication: ALPRAZolam  (XANAX ) 0.25 MG tablet [[id number]]  Has the patient contacted their pharmacy? Yes- Patient needs a refill (Agent: If no, request that the patient contact the pharmacy for the refill. If patient does not wish to contact the pharmacy document the reason why and proceed with request.) (Agent: If yes, when and what did the pharmacy advise?)  This is the patient's preferred pharmacy:  [facility] [address] [place] - [address] [place] [address] Phone: [phone] Fax: [phone]  Is this the correct pharmacy for this prescription? Yes If no, delete pharmacy and type the correct one.   Has the prescription been filled recently? No  Is the patient out of the medication? Yes  Has the patient been seen for an appointment in the last year OR does the patient have an upcoming appointment? Yes  Can we respond through MyChart? No  Agent: Please be advised that Rx refills may take up to 3 business days. We ask that you follow-up with your pharmacy.

## 2023-12-10 MED ORDER — ALPRAZOLAM 0.25 MG PO TABS: 0.2500 mg | ORAL_TABLET | Freq: Two times a day (BID) | ORAL | 0 refills | Status: DC | PRN

## 2024-01-12 ENCOUNTER — Other Ambulatory Visit: Payer: Self-pay | Admitting: Physician Assistant

## 2024-01-20 ENCOUNTER — Other Ambulatory Visit: Payer: Self-pay

## 2024-01-20 DIAGNOSIS — M51369 Other intervertebral disc degeneration, lumbar region without mention of lumbar back pain or lower extremity pain: Secondary | ICD-10-CM

## 2024-01-20 NOTE — Telephone Encounter (Signed)
 Alprazolam : 12/10/23 20 tabs/0 RF (10 day) Tramadol : 08/31/23 30 tabs/0 RF (8 day)

## 2024-01-20 NOTE — Telephone Encounter (Signed)
 Copied from CRM [id number]. Topic: Clinical - Medication Refill >> Jan 20, 2024  3:09 PM Pinkey ORN wrote: Medication: traMADol  (ULTRAM ) 50 MG tablet, ALPRAZolam  (XANAX ) 0.25 MG tablet   Has the patient contacted their pharmacy? Yes (Agent: If no, request that the patient contact the pharmacy for the refill. If patient does not wish to contact the pharmacy document the reason why and proceed with request.) (Agent: If yes, when and what did the pharmacy advise?)  This is the patient's preferred pharmacy:  [facility] [address] [place] - [address] [place] [address] Phone: [phone] Fax: [phone]  Is this the correct pharmacy for this prescription? Yes If no, delete pharmacy and type the correct one.   Has the prescription been filled recently? No  Is the patient out of the medication? No  Has the patient been seen for an appointment in the last year OR does the patient have an upcoming appointment? Yes  Can we respond through MyChart? Yes  Agent: Please be advised that Rx refills may take up to 3 business days. We ask that you follow-up with your pharmacy.

## 2024-01-24 MED ORDER — ALPRAZOLAM 0.25 MG PO TABS: 0.2500 mg | ORAL_TABLET | Freq: Two times a day (BID) | ORAL | 0 refills | Status: DC | PRN

## 2024-01-24 MED ORDER — TRAMADOL HCL 50 MG PO TABS: 50.0000 mg | ORAL_TABLET | Freq: Four times a day (QID) | ORAL | 0 refills | Status: DC | PRN

## 2024-02-17 ENCOUNTER — Other Ambulatory Visit: Payer: Self-pay

## 2024-02-17 DIAGNOSIS — J309 Allergic rhinitis, unspecified: Secondary | ICD-10-CM

## 2024-02-17 MED ORDER — FLUTICASONE PROPIONATE 50 MCG/ACT NA SUSP
1.0000 | Freq: Every day | NASAL | 0 refills | Status: AC
Start: 2024-02-17 — End: ?

## 2024-02-24 ENCOUNTER — Other Ambulatory Visit: Payer: Self-pay | Admitting: Cardiovascular Disease

## 2024-03-01 ENCOUNTER — Encounter

## 2024-03-01 DIAGNOSIS — I10 Essential (primary) hypertension: Secondary | ICD-10-CM

## 2024-03-01 DIAGNOSIS — G43809 Other migraine, not intractable, without status migrainosus: Secondary | ICD-10-CM

## 2024-03-01 DIAGNOSIS — M51369 Other intervertebral disc degeneration, lumbar region without mention of lumbar back pain or lower extremity pain: Secondary | ICD-10-CM

## 2024-03-01 DIAGNOSIS — R7303 Prediabetes: Secondary | ICD-10-CM

## 2024-03-01 DIAGNOSIS — E785 Hyperlipidemia, unspecified: Secondary | ICD-10-CM

## 2024-03-01 DIAGNOSIS — R1032 Left lower quadrant pain: Secondary | ICD-10-CM

## 2024-03-01 DIAGNOSIS — F39 Unspecified mood [affective] disorder: Secondary | ICD-10-CM

## 2024-03-10 ENCOUNTER — Other Ambulatory Visit: Payer: Self-pay | Admitting: Family

## 2024-03-13 ENCOUNTER — Other Ambulatory Visit: Payer: Self-pay

## 2024-03-13 ENCOUNTER — Ambulatory Visit: Payer: Self-pay

## 2024-03-13 DIAGNOSIS — M51369 Other intervertebral disc degeneration, lumbar region without mention of lumbar back pain or lower extremity pain: Secondary | ICD-10-CM

## 2024-03-13 MED ORDER — TRAMADOL HCL 50 MG PO TABS: 50.0000 mg | ORAL_TABLET | Freq: Every day | ORAL | 0 refills | Status: DC | PRN

## 2024-03-13 NOTE — Telephone Encounter (Signed)
 FYI Only or Action Required?: Action required by provider: medication refill request.  Patient was last seen in primary care on 08/31/2023 by Hope Merle, MD.  Called Nurse Triage reporting Anxiety and Medication Refill.  Symptoms began several days ago.  Interventions attempted: Rest, hydration, or home remedies.  Symptoms are: unchanged.  Triage Disposition: See PCP When Office is Open (Within 3 Days)  Patient/caregiver understands and will follow disposition?: Unsure          Copied from CRM #[id number]. Topic: Clinical - Red Word Triage >> Mar 13, 2024  2:27 PM Anairis L wrote: [facility] that prompted transfer to Nurse Triage: Anxiety, death in family,  ALPRAZolam  (XANAX ) 0.25 MG tablet refill has been pending since 03/10/2024 Reason for Disposition  MODERATE anxiety (e.g., persistent or frequent anxiety symptoms; interferes with sleep, school, or work)    Pt calling to check status of refill requested 3 days ago -- she is established pt of [facility]. Pt reports family deaths recently and needs her Xanax  refilled to help cope. Denies any harm to self or others. Triager attempted to schedule with [facility], but no access within dispo timeframe. Pt then requested Dr Abbey to refill.  Triager will forward encounter for Dr Abbey 's office to review and advise. Patient verbalized understanding and is expecting call back from office for next steps. Triager also advised that if pt does not hear back from office, to follow disposition for further evaluation/treatment.  Answer Assessment - Initial Assessment Questions 1. DRUG NAME: What medicine do you need to have refilled?     ALPRAZolam  (XANAX ) 0.25 MG tablet 2. REFILLS REMAINING: How many refills are remaining? Notes: The label on the medicine or pill bottle will show how many refills are remaining. If there are no refills remaining, then a renewal may be needed.     0 3. EXPIRATION DATE: What is the expiration date? Note: The label  states when the prescription will expire, and thus can no longer be refilled.)     N/a 4. PRESCRIBER: Who prescribed it? Note: The prescribing doctor or group is responsible for refill approvals.SABRA     PCP 5. PHARMACY: Have you contacted your pharmacy (drugstore)? Note: Some pharmacies will contact the doctor (or NP/PA).      yes 6. SYMPTOMS: Do you have any symptoms?     Anxiety - has had 4 deaths in the past month, including sister. 7. PREGNANCY: Is there any chance that you are pregnant? When was your last menstrual period?     N/a  Protocols used: Medication Refill and Renewal Call-A-AH, Anxiety and Panic Attack-A-AH

## 2024-03-13 NOTE — Telephone Encounter (Signed)
 1. Degeneration of intervertebral disc of lumbar region, unspecified whether pain present - I sent 30 tablets refill on Tramadol  as doc of the day. She has an office visit in 06/02/24. She needs office visit sooner than that to get further refill on Tramadol  please. It can be with any provider.  - traMADol  (ULTRAM ) 50 MG tablet; Take 1 tablet (50 mg total) by mouth daily as needed.  Dispense: 30 tablet; Refill: 0  Luke Shade, MD

## 2024-03-14 NOTE — Telephone Encounter (Signed)
 Noted

## 2024-03-17 ENCOUNTER — Ambulatory Visit (INDEPENDENT_AMBULATORY_CARE_PROVIDER_SITE_OTHER): Admitting: Nurse Practitioner

## 2024-03-17 ENCOUNTER — Telehealth: Payer: Self-pay | Admitting: Nurse Practitioner

## 2024-03-17 ENCOUNTER — Encounter: Payer: Self-pay | Admitting: Nurse Practitioner

## 2024-03-17 VITALS — BP 124/76 | HR 62 | Temp 98.6°F | Ht 64.0 in | Wt 209.0 lb

## 2024-03-17 DIAGNOSIS — R0609 Other forms of dyspnea: Secondary | ICD-10-CM

## 2024-03-17 DIAGNOSIS — E785 Hyperlipidemia, unspecified: Secondary | ICD-10-CM

## 2024-03-17 DIAGNOSIS — F32A Depression, unspecified: Secondary | ICD-10-CM

## 2024-03-17 DIAGNOSIS — H9193 Unspecified hearing loss, bilateral: Secondary | ICD-10-CM | POA: Diagnosis not present

## 2024-03-17 DIAGNOSIS — G43809 Other migraine, not intractable, without status migrainosus: Secondary | ICD-10-CM

## 2024-03-17 DIAGNOSIS — F419 Anxiety disorder, unspecified: Secondary | ICD-10-CM

## 2024-03-17 DIAGNOSIS — I10 Essential (primary) hypertension: Secondary | ICD-10-CM | POA: Diagnosis not present

## 2024-03-17 DIAGNOSIS — G4733 Obstructive sleep apnea (adult) (pediatric): Secondary | ICD-10-CM

## 2024-03-17 MED ORDER — SUMATRIPTAN SUCCINATE 50 MG PO TABS: 50.0000 mg | ORAL_TABLET | ORAL | 1 refills | Status: DC | PRN

## 2024-03-17 MED ORDER — ONDANSETRON HCL 4 MG PO TABS: 4.0000 mg | ORAL_TABLET | Freq: Three times a day (TID) | ORAL | 0 refills | Status: AC | PRN

## 2024-03-17 MED ORDER — ALPRAZOLAM 0.25 MG PO TABS: 0.2500 mg | ORAL_TABLET | Freq: Two times a day (BID) | ORAL | 0 refills | Status: DC | PRN

## 2024-03-17 NOTE — Progress Notes (Unsigned)
 Acute Patient Office Visit  Subjective:  Patient ID: Dana Obrien, female    DOB: [birth year]  Age: 78 y.o. MRN: [id number]  CC:  Chief Complaint  Patient presents with   Medication Refill    Palpitations, SOB, tires out easily, difficulty sleeping Hard of hearing both ears   Discussed the use of a AI scribe software for clinical note transcription with the patient, who gave verbal consent to proceed.  HPI  Dana Obrien is a 78 year old female who presents for a refill of Xanax  accompanied with her daughter.   She is overdue for labs.   She uses Xanax  for anxiety and insomnia, and fluoxetine  for depression. She previously used trazodone  for sleep but stopped due to adverse effects. She has been on Xanax  for years.  She takes valsartan , hydrochlorothiazide , and amlodipine  for hypertension, with fluctuating blood pressure readings. Her daughter is concerned about the number of medications and the lack of recent blood work.  She experiences shortness of breath on exertion, such as when making the bed or vacuuming, ongoing for about a year. She has asthma and uses an inhaler as needed. She has severe sleep apnea and uses a BiPAP machine nightly.  She frequently experiences migraines and takes medication for them. She also takes Protonix . She has significant hearing difficulties and does not wear her hearing aid due to ineffectiveness. Her daughter suggests seeing an audiologist.  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   Cancer [facility]) 2004   right lumpectomy, chemotherapy and radiation Dr. Sheryn   CPAP (continuous positive airway pressure) dependence    Depression    GERD (gastroesophageal reflux  disease)    Hypertension    Migraine    Personal history of chemotherapy 2004   Personal history of radiation therapy 2004   Personal history of radiation therapy 2019   PONV (postoperative nausea and vomiting)    Shingles    Sleep apnea    uses BiPAP, severe OSA   Vitamin D  deficiency    IN THE PAST    Past Surgical History:  Procedure Laterality Date   ABDOMINAL HYSTERECTOMY     ANTERIOR AND POSTERIOR REPAIR     BREAST BIOPSY Right 2004   stereo. 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  3.5 cm maximum diameter, minimal margins 5 mm.  In situ component less than 10%.   BREAST LUMPECTOMY Right 2004   BREAST LUMPECTOMY Left 09/17/2017   Procedure: BREAST EXCISION, SENTINEL NODE BIOPSY;  Surgeon: Dessa Reyes ORN, MD;  Location: [facility] ORS;  Service: General;  Laterality: Left;   COLONOSCOPY  2015   COLONOSCOPY WITH PROPOFOL  N/A 10/04/2019   Procedure: COLONOSCOPY WITH PROPOFOL ;  Surgeon: Dessa Reyes ORN, MD;  Location: [facility] ENDOSCOPY;  Service: Endoscopy;  Laterality: N/A;   DILATION AND CURETTAGE OF UTERUS     ETHMOIDECTOMY Bilateral 11/05/2016   Procedure: ETHMOIDECTOMY;  Surgeon: Carlie Clark, MD;  Location: [facility] OR;  Service: ENT;  Laterality: Bilateral;   LAPAROTOMY  MAXILLARY ANTROSTOMY Bilateral 11/05/2016   Procedure: MAXILLARY ANTROSTOMY;  Surgeon: Carlie Clark, MD;  Location: [facility] OR;  Service: ENT;  Laterality: Bilateral;   SINUS ENDO W/FUSION Bilateral 11/05/2016   Procedure: FRONTAL RECESS EXPLORATION;  Surgeon: Carlie Clark, MD;  Location: [facility] OR;  Service: ENT;  Laterality: Bilateral;  BILATERAL ENDOSCOPIC SINUS SURGERY WITH FUSION   SINUS EXPLORATION  11/05/2016   SPHENOIDECTOMY Bilateral 11/05/2016   Procedure: SPHENOIDECTOMY;  Surgeon: Carlie Clark, MD;  Location: [facility] OR;  Service:  ENT;  Laterality: Bilateral;    Family History  Problem Relation Age of Onset   Heart failure Mother    Heart disease Mother    Hyperlipidemia Mother    Hypertension Mother    Heart murmur Mother    Hypertension Father    Osteoarthritis Father    Stroke Father    Alzheimer's disease Sister    Alzheimer's disease Brother    Brain cancer Brother    Healthy Daughter    Healthy Son    Breast cancer Neg Hx     Social History   Socioeconomic History   Marital status: Widowed    Spouse name: Not on file   Number of children: Not on file   Years of education: Not on file   Highest education level: Associate degree: academic program  Occupational History   Not on file  Tobacco Use   Smoking status: Never   Smokeless tobacco: Never  Vaping Use   Vaping status: Never Used  Substance and Sexual Activity   Alcohol use: Not Currently    Comment: rarely   Drug use: No   Sexual activity: Not Currently  Other Topics Concern   Not on file  Social History Narrative   Daily Caffeine Use:  1-2 coffee   Regular Exercise -  NO   Social Drivers of Health   Financial Resource Strain: Medium Risk (08/27/2023)   Overall Financial Resource Strain (CARDIA)    Difficulty of Paying Living Expenses: Somewhat hard  Food Insecurity: No Food Insecurity (08/27/2023)   Hunger Vital Sign    Worried About Running Out of Food in the Last Year: Never true    Ran Out of Food in the Last Year: Never true  Transportation Needs: No Transportation Needs (08/27/2023)   PRAPARE - Administrator, Civil Service (Medical): No    Lack of Transportation (Non-Medical): No  Physical Activity: Insufficiently Active (08/27/2023)   Exercise Vital Sign    Days of Exercise per Week: 1 day    Minutes of Exercise per Session: 30 min  Stress: Stress Concern Present (08/27/2023)   [employer] of Occupational Health - Occupational Stress Questionnaire    Feeling of Stress : Very much  Social  Connections: Moderately Integrated (08/27/2023)   Social Connection and Isolation Panel    Frequency of Communication with Friends and Family: More than three times a week    Frequency of Social Gatherings with Friends and Family: More than three times a week    Attends Religious Services: More than 4 times per year    Active Member of [employer] or Organizations: Yes    Attends Banker Meetings: More than 4 times per year    Marital Status: Widowed  Intimate Partner Violence: Not At Risk (09/09/2022)   Humiliation, Afraid, Rape, and Kick questionnaire    Fear of Current or Ex-Partner: No    Emotionally Abused: No    Physically Abused: No    Sexually Abused: No  Outpatient Medications Prior to Visit  Medication Sig Dispense Refill   albuterol  (VENTOLIN  HFA) 108 (90 Base) MCG/ACT inhaler Inhale 2 puffs into the lungs every 6 (six) hours as needed for wheezing or shortness of breath. 8 g 2   amLODipine  (NORVASC ) 5 MG tablet Take 1 tablet (5 mg total) by mouth daily. 90 tablet 3   celecoxib  (CELEBREX ) 100 MG capsule Take 1 capsule (100 mg total) by mouth 2 (two) times daily. 180 capsule 3   cetirizine  (EQ ALLERGY RELIEF, CETIRIZINE ,) 10 MG tablet Take 1 tablet (10 mg total) by mouth daily. 90 tablet 3   EQ ARTHRITIS PAIN RELIEVER 1 % GEL APPLY 2 GRAMS TOPICALLY FOUR TIMES DAILY AS NEEDED 50 g 0   FLUoxetine  (PROZAC ) 20 MG capsule Take 3 capsules (60 mg total) by mouth daily. 270 capsule 3   fluticasone  (FLONASE ) 50 MCG/ACT nasal spray Place 1 spray into both nostrils daily. 16 g 0   hydrochlorothiazide  (HYDRODIURIL ) 25 MG tablet Take 1 tablet by mouth once daily 30 tablet 0   pantoprazole  (PROTONIX ) 40 MG tablet Take 1 tablet (40 mg total) by mouth daily. 90 tablet 3   rosuvastatin  (CRESTOR ) 40 MG tablet Take 1 tablet (40 mg total) by mouth daily. 90 tablet 3   senna (SENOKOT) 8.6 MG TABS tablet Take 1 tablet by mouth at bedtime.      traMADol  (ULTRAM ) 50 MG tablet Take 1 tablet  (50 mg total) by mouth daily as needed. 30 tablet 0   valsartan  (DIOVAN ) 320 MG tablet Take 1 tablet (320 mg total) by mouth daily. 90 tablet 3   ALPRAZolam  (XANAX ) 0.25 MG tablet Take 1 tablet (0.25 mg total) by mouth 2 (two) times daily as needed. for anxiety 20 tablet 0   ondansetron  (ZOFRAN ) 4 MG tablet Take 1 tablet (4 mg total) by mouth every 8 (eight) hours as needed for nausea or vomiting. 20 tablet 0   SUMAtriptan  (IMITREX ) 50 MG tablet Take 1 tablet (50 mg total) by mouth every 2 (two) hours as needed for migraine. May repeat in 2 hours if headache persists or recurs. No more than 2 doses in a 24 hour time frame. 10 tablet 2   methocarbamol  (ROBAXIN ) 500 MG tablet Take 1 tablet (500 mg total) by mouth every 8 (eight) hours as needed for muscle spasms. (Patient not taking: Reported on 03/17/2024) 30 tablet 0   Multiple Vitamin (MULTI-VITAMIN) tablet Take 1 tablet by mouth daily.  (Patient not taking: Reported on 03/17/2024)     Omega-3 Fatty Acids (FISH OIL PO) Take 1 tablet by mouth daily. (Patient not taking: Reported on 03/17/2024)     tretinoin  (RETIN-A ) 0.05 % cream Apply 1 application topically at bedtime. (Patient not taking: Reported on 03/17/2024) 45 g 0   No facility-administered medications prior to visit.    Allergies  Allergen Reactions   Lidocaine  Palpitations   Codeine Nausea Only and Nausea And Vomiting   Morphine  Nausea Only   Niacin Rash   Niacin And Related Rash    ANTIHYPERLIPEMICS    ROS Review of Systems Negative unless indicated in HPI.    Objective:    Physical Exam Constitutional:      Appearance: Normal appearance.  HENT:     Mouth/Throat:     Mouth: Mucous membranes are moist.  Eyes:     Conjunctiva/sclera: Conjunctivae normal.     Pupils: Pupils are equal, round, and reactive to light.  Cardiovascular:     Rate and Rhythm: Normal  rate and regular rhythm.     Pulses: Normal pulses.     Heart sounds: Normal heart sounds.  Pulmonary:      Effort: Pulmonary effort is normal.     Breath sounds: Normal breath sounds.  Abdominal:     General: Bowel sounds are normal.     Palpations: Abdomen is soft.  Musculoskeletal:     Cervical back: Normal range of motion. No tenderness.  Skin:    General: Skin is warm.     Findings: No bruising.  Neurological:     General: No focal deficit present.     Mental Status: She is alert and oriented to person, place, and time. Mental status is at baseline.  Psychiatric:        Mood and Affect: Mood normal.        Behavior: Behavior normal.        Thought Content: Thought content normal.        Judgment: Judgment normal.     BP 124/76   Pulse 62   Temp 98.6 F (37 C)   Ht 5' 4 (1.626 m)   Wt 209 lb (94.8 kg)   SpO2 95%   BMI 35.87 kg/m  Wt Readings from Last 3 Encounters:  03/17/24 209 lb (94.8 kg)  08/31/23 216 lb (98 kg)  07/29/23 214 lb 6 oz (97.2 kg)     Health Maintenance  Topic Date Due   Zoster Vaccines- Shingrix (1 of 2) Never done   Medicare Annual Wellness (AWV)  03/13/2023   Mammogram  03/22/2024   COVID-19 Vaccine (3 - Pfizer risk series) 03/31/2024 (Originally 07/29/2019)   Influenza Vaccine  08/29/2024 (Originally 12/31/2023)   Colonoscopy  10/03/2024   DTaP/Tdap/Td (4 - Td or Tdap) 12/16/2032   Pneumococcal Vaccine: 50+ Years  Completed   DEXA SCAN  Completed   Hepatitis C Screening  Completed   Meningococcal B Vaccine  Aged Out    There are no preventive care reminders to display for this patient.  Lab Results  Component Value Date   TSH 5.22 03/20/2024   Lab Results  Component Value Date   WBC 6.6 03/20/2024   HGB 11.7 (L) 03/20/2024   HCT 35.3 (L) 03/20/2024   MCV 90.8 03/20/2024   PLT 204.0 03/20/2024   Lab Results  Component Value Date   NA 140 03/20/2024   K 3.9 03/20/2024   CO2 30 03/20/2024   GLUCOSE 88 03/20/2024   BUN 11 03/20/2024   CREATININE 0.70 03/20/2024   BILITOT 0.4 03/20/2024   ALKPHOS 48 03/20/2024   AST 21 03/20/2024    ALT 20 03/20/2024   PROT 7.0 03/20/2024   ALBUMIN 4.1 03/20/2024   CALCIUM  8.9 03/20/2024   ANIONGAP 8 12/25/2022   GFR 83.91 03/20/2024   Lab Results  Component Value Date   CHOL 166 03/20/2024   Lab Results  Component Value Date   HDL 64.40 03/20/2024   Lab Results  Component Value Date   LDLCALC 80 03/20/2024   Lab Results  Component Value Date   TRIG 111.0 03/20/2024   Lab Results  Component Value Date   CHOLHDL 3 03/20/2024   Lab Results  Component Value Date   HGBA1C 5.9 07/21/2022      Assessment & Plan:  Hearing difficulty of both ears Assessment & Plan: Hearing loss with ineffective hearing aids. Social and confidence issues due to hearing difficulties. - Refer to ENT for hearing evaluation and potential hearing aid adjustment.   Orders: -  Ambulatory referral to ENT  Other migraine without status migrainosus, not intractable Assessment & Plan: Chronic migraines stable on medication - Imitrex  refilled.   Orders: -     SUMAtriptan  Succinate; Take 1 tablet (50 mg total) by mouth every 2 (two) hours as needed for migraine. May repeat in 2 hours if headache persists or recurs. No more than 2 doses in a 24 hour time frame.  Dispense: 10 tablet; Refill: 1  Primary hypertension Assessment & Plan: Hypertension managed with valsartan , hydrochlorothiazide , and amlodipine . Concerns about medication burden and potential kidney damage from hydrochlorothiazide . - Order comprehensive blood work. - Monitor blood pressure regularly.  Orders: -     CBC with Differential/Platelet; Future -     Comprehensive metabolic panel with GFR; Future -     TSH; Future  Hyperlipidemia, unspecified hyperlipidemia type -     Lipid panel; Future  OSA (obstructive sleep apnea) Assessment & Plan: Severe obstructive sleep apnea managed with nightly BiPAP use. - Encourage follow-up with pulmonology.   Dyspnea on exertion Assessment & Plan: Chronic exertional dyspnea  present for over a year. Previous cardiac evaluation in 2024. Asthma present, no current inhaler use reported. - Recommend follow-up with cardiology for further evaluation of exertional dyspnea. - Encourage follow-up with pulmonology.   Anxiety and depression Assessment & Plan: Generalized anxiety disorder and depression Chronic anxiety and depression managed with fluoxetine  and Xanax . Discussed importance of not abruptly discontinuing Xanax . - Refill Xanax  prescription. - Update Xanax  contract.   Other orders -     ALPRAZolam ; Take 1 tablet (0.25 mg total) by mouth 2 (two) times daily as needed. for anxiety  Dispense: 30 tablet; Refill: 0 -     Ondansetron  HCl; Take 1 tablet (4 mg total) by mouth every 8 (eight) hours as needed for nausea or vomiting.  Dispense: 20 tablet; Refill: 0     Follow-up: Return for fasting lab appontment next week.   Juline Sanderford, NP

## 2024-03-17 NOTE — Patient Instructions (Addendum)
 Please schedule fasting lab appointment.  -Follow up with cardiology for further evaluation of chronic  shortness of breath.  [address] [facility], [address] [place], [place], [place] [address], Phone: [phone] -Follow up with pulmonology.  HEARING LOSS: You have significant hearing difficulties and do not wear your hearing aid due to ineffectiveness. -We referred you to an ENT for a hearing evaluation and potential hearing aid adjustment.

## 2024-03-17 NOTE — Telephone Encounter (Signed)
 Pt's daughter came back in to ask about prescriptions for Incontinence pads and Senokot, since Pt's insurance may pay for them if prescribed. Pt saw Vincente today but has her TOC w/[facility] in January. So sending to Doc of the day since Dr Abbey will be out next week.

## 2024-03-20 ENCOUNTER — Other Ambulatory Visit (INDEPENDENT_AMBULATORY_CARE_PROVIDER_SITE_OTHER)

## 2024-03-20 DIAGNOSIS — I10 Essential (primary) hypertension: Secondary | ICD-10-CM | POA: Diagnosis not present

## 2024-03-20 DIAGNOSIS — E785 Hyperlipidemia, unspecified: Secondary | ICD-10-CM

## 2024-03-20 LAB — CBC WITH DIFFERENTIAL/PLATELET
Basophils Absolute: 0.1 K/uL (ref 0.0–0.1)
Basophils Relative: 1.1 % (ref 0.0–3.0)
Eosinophils Absolute: 0.3 K/uL (ref 0.0–0.7)
Eosinophils Relative: 4.9 % (ref 0.0–5.0)
HCT: 35.3 % — ABNORMAL LOW (ref 36.0–46.0)
Hemoglobin: 11.7 g/dL — ABNORMAL LOW (ref 12.0–15.0)
Lymphocytes Relative: 24.4 % (ref 12.0–46.0)
Lymphs Abs: 1.6 K/uL (ref 0.7–4.0)
MCHC: 33.2 g/dL (ref 30.0–36.0)
MCV: 90.8 fl (ref 78.0–100.0)
Monocytes Absolute: 0.6 K/uL (ref 0.1–1.0)
Monocytes Relative: 9.1 % (ref 3.0–12.0)
Neutro Abs: 4 K/uL (ref 1.4–7.7)
Neutrophils Relative %: 60.5 % (ref 43.0–77.0)
Platelets: 204 K/uL (ref 150.0–400.0)
RBC: 3.89 Mil/uL (ref 3.87–5.11)
RDW: 15.6 % — ABNORMAL HIGH (ref 11.5–15.5)
WBC: 6.6 K/uL (ref 4.0–10.5)

## 2024-03-20 LAB — COMPREHENSIVE METABOLIC PANEL WITH GFR
ALT: 20 U/L (ref 0–35)
AST: 21 U/L (ref 0–37)
Albumin: 4.1 g/dL (ref 3.5–5.2)
Alkaline Phosphatase: 48 U/L (ref 39–117)
BUN: 11 mg/dL (ref 6–23)
CO2: 30 meq/L (ref 19–32)
Calcium: 8.9 mg/dL (ref 8.4–10.5)
Chloride: 102 meq/L (ref 96–112)
Creatinine, Ser: 0.7 mg/dL (ref 0.40–1.20)
GFR: 83.91 mL/min (ref >60.00)
Glucose, Bld: 88 mg/dL (ref 70–99)
Potassium: 3.9 meq/L (ref 3.5–5.1)
Sodium: 140 meq/L (ref 135–145)
Total Bilirubin: 0.4 mg/dL (ref 0.2–1.2)
Total Protein: 7 g/dL (ref 6.0–8.3)

## 2024-03-20 LAB — LIPID PANEL
Cholesterol: 166 mg/dL (ref 0–200)
HDL: 64.4 mg/dL (ref >39.00)
LDL Cholesterol: 80 mg/dL (ref 0–99)
NonHDL: 101.88
Total CHOL/HDL Ratio: 3
Triglycerides: 111 mg/dL (ref 0.0–149.0)
VLDL: 22.2 mg/dL (ref 0.0–40.0)

## 2024-03-20 LAB — TSH: TSH: 5.22 u[IU]/mL (ref 0.35–5.50)

## 2024-03-31 ENCOUNTER — Ambulatory Visit: Payer: Self-pay | Admitting: Nurse Practitioner

## 2024-03-31 NOTE — Assessment & Plan Note (Signed)
 Chronic migraines stable on medication - Imitrex  refilled.

## 2024-03-31 NOTE — Assessment & Plan Note (Signed)
 Generalized anxiety disorder and depression Chronic anxiety and depression managed with fluoxetine  and Xanax . Discussed importance of not abruptly discontinuing Xanax . - Refill Xanax  prescription. - Update Xanax  contract.

## 2024-03-31 NOTE — Assessment & Plan Note (Signed)
 Severe obstructive sleep apnea managed with nightly BiPAP use. - Encourage follow-up with pulmonology.

## 2024-03-31 NOTE — Assessment & Plan Note (Signed)
 Hypertension managed with valsartan , hydrochlorothiazide , and amlodipine . Concerns about medication burden and potential kidney damage from hydrochlorothiazide . - Order comprehensive blood work. - Monitor blood pressure regularly.

## 2024-03-31 NOTE — Progress Notes (Signed)
 The blood work shows no low Hb. We will recheck at next appointment   All other labs normal .

## 2024-03-31 NOTE — Assessment & Plan Note (Signed)
 Hearing loss with ineffective hearing aids. Social and confidence issues due to hearing difficulties. - Refer to ENT for hearing evaluation and potential hearing aid adjustment.

## 2024-03-31 NOTE — Assessment & Plan Note (Signed)
 Chronic exertional dyspnea present for over a year. Previous cardiac evaluation in 2024. Asthma present, no current inhaler use reported. - Recommend follow-up with cardiology for further evaluation of exertional dyspnea. - Encourage follow-up with pulmonology.

## 2024-04-03 ENCOUNTER — Encounter: Payer: Self-pay | Admitting: Radiology

## 2024-04-11 ENCOUNTER — Ambulatory Visit: Admitting: Nurse Practitioner

## 2024-04-18 ENCOUNTER — Other Ambulatory Visit: Payer: Self-pay | Admitting: Nurse Practitioner

## 2024-04-18 ENCOUNTER — Other Ambulatory Visit: Payer: Self-pay

## 2024-04-18 DIAGNOSIS — M51369 Other intervertebral disc degeneration, lumbar region without mention of lumbar back pain or lower extremity pain: Secondary | ICD-10-CM

## 2024-04-18 NOTE — Telephone Encounter (Signed)
 We will discuss at OV on 12/2

## 2024-04-19 NOTE — Telephone Encounter (Signed)
 Called Patient's daughter Duwaine to let her know that per Charanpreet Kaur these issues will be discussed at the 05/04/24 visit.

## 2024-04-20 NOTE — Telephone Encounter (Signed)
 Please inform pt medication has been refilled.

## 2024-04-20 NOTE — Telephone Encounter (Signed)
Controlled substance database reviewed.  Medication sent to pharmacy.

## 2024-04-20 NOTE — Telephone Encounter (Unsigned)
 Copied from CRM [id number]. Topic: Clinical - Medication Question >> Apr 20, 2024  3:23 PM Dana Obrien wrote: Reason for CRM: Patient returned call from clinic, I informed patient her medication Xanax  .25 mg and Tramdol was sent to pharmacy of choice,  No further questions

## 2024-04-20 NOTE — Telephone Encounter (Signed)
 Please inform pt both xanax  and tramadol  sent

## 2024-05-04 ENCOUNTER — Ambulatory Visit

## 2024-05-04 ENCOUNTER — Ambulatory Visit
Admission: EM | Admit: 2024-05-04 | Discharge: 2024-05-04 | Disposition: A | Discharge status: Home / Self Care | Attending: Emergency Medicine | Admitting: Emergency Medicine

## 2024-05-04 ENCOUNTER — Ambulatory Visit: Admitting: Nurse Practitioner

## 2024-05-04 DIAGNOSIS — R051 Acute cough: Secondary | ICD-10-CM | POA: Diagnosis not present

## 2024-05-04 DIAGNOSIS — R0602 Shortness of breath: Secondary | ICD-10-CM | POA: Diagnosis not present

## 2024-05-04 DIAGNOSIS — B9789 Other viral agents as the cause of diseases classified elsewhere: Secondary | ICD-10-CM | POA: Diagnosis not present

## 2024-05-04 DIAGNOSIS — J988 Other specified respiratory disorders: Secondary | ICD-10-CM

## 2024-05-04 LAB — POCT INFLUENZA A/B
Influenza A, POC: NEGATIVE
Influenza B, POC: NEGATIVE

## 2024-05-04 LAB — POC SOFIA SARS ANTIGEN FIA: SARS Coronavirus 2 Ag: NEGATIVE

## 2024-05-04 MED ORDER — PREDNISONE 20 MG PO TABS: 40.0000 mg | ORAL_TABLET | Freq: Every day | ORAL | 0 refills | Status: AC

## 2024-05-04 MED ORDER — BENZONATATE 100 MG PO CAPS: 100.0000 mg | ORAL_CAPSULE | Freq: Three times a day (TID) | ORAL | 0 refills | Status: DC

## 2024-05-04 MED ORDER — PREDNISONE 20 MG PO TABS: 40.0000 mg | ORAL_TABLET | Freq: Every day | ORAL | 0 refills | Status: DC

## 2024-05-04 NOTE — ED Triage Notes (Signed)
 Pt c/o head congestion, SOB, wheezing, chest pain when inhaling, cough x2days

## 2024-05-04 NOTE — Discharge Instructions (Signed)
 Your COVID and flu testing are both negative today.  Your x-ray did not reveal any evidence of pneumonia. I believe you likely have a viral respiratory illness. Due to your shortness of breath I prescribed prednisone  for you to take over the next 5 days.  You will take 2 tablets once daily for 5 days. I have prescribed Tessalon  that you can take every 8 hours as needed for cough. Continue using your albuterol  inhaler every 4-6 hours as needed for shortness of breath and wheezing. You can take over-the-counter Coricidin for cough and congestion.  This medication is safe with your history of high blood pressure. Follow-up with your primary care provider or return here as needed. If you develop worsening shortness of breath, significant chest pain, persistent fevers, or severe weakness please seek immediate medical treatment in the emergency department.

## 2024-05-04 NOTE — ED Provider Notes (Signed)
 [facility]    CSN: [id number] Arrival date & time: 05/04/24  1617      History   Chief Complaint Chief Complaint  Patient presents with   Nasal Congestion   Headache   Shortness of Breath    HPI Dana Obrien is a 78 y.o. female.   Patient presents with daughter for nasal congestion, chest congestion, productive cough, shortness of breath, intermittent wheezing, and chest pain with deep breathing that began on 12/2.  Denies known fever, but states that she has felt feverish at times.  Denies nausea, vomiting, diarrhea, and abdominal pain.    Daughter reports that patient does use a BiPAP machine for severe OSA.  Patient reports that she does have a history of asthma and has been using her albuterol  inhaler with some relief.  Daughter reports that they recently traveled to [place] , but is unsure of any confirmed sick exposures.  Daughter reports that she has been a little sick herself.  The history is provided by the patient, medical records and a relative. The history is limited by the condition of the patient (Patient is hard of hearing, but daughter is helping to provide history.).  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   Cancer [facility]) 2004   right lumpectomy, chemotherapy and radiation Dr. Sheryn   CPAP (continuous positive airway pressure) dependence    Depression    GERD (gastroesophageal reflux disease)    Hypertension    Migraine    Personal history of chemotherapy 2004   Personal history of radiation therapy 2004   Personal history of radiation therapy 2019   PONV (postoperative nausea and vomiting)    Shingles     Sleep apnea    uses BiPAP, severe OSA   Vitamin D  deficiency    IN THE PAST    Patient Active Problem List   Diagnosis Date Noted   Mood disorder 09/05/2023   Migraine 09/05/2023   Hypokalemia 01/03/2023   Positive D dimer 01/03/2023   Palpitations 11/04/2022   MDD (major depressive disorder), recurrent episode, moderate (HCC) 09/09/2022   Major depressive disorder, single episode, severe without psychosis (HCC) 09/09/2022   Hydronephrosis 07/21/2022   Left lower quadrant pain 04/20/2022   Urge incontinence 07/11/2021   Right knee pain 11/08/2020   Vitamin D  deficiency 08/08/2020   Gastroesophageal reflux disease without esophagitis 03/25/2020   Dermatitis due to plants, including poison ivy, sumac, and oak 02/29/2020   Neuropathy 12/15/2019   Allergic rhinitis 07/05/2019   Brain atrophy 05/18/2019   Bilateral hearing loss 08/15/2018   Decreased energy 08/15/2018   Prediabetes 06/02/2018   Arthralgia 06/02/2018   BPPV (benign paroxysmal positional vertigo), right 03/11/2018   Dysfunction of right eustachian tube 03/11/2018   Malignant neoplasm of upper-outer quadrant of left breast in female, 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/14/2017   Fatty liver 08/24/2017   Hearing difficulty of both ears 06/25/2017   Bilateral sensorineural hearing loss 10/13/2016   Right ear impacted cerumen 10/13/2016   GERD (gastroesophageal reflux disease) 09/07/2016   Dyspnea on exertion 01/15/2016   Uterine prolapse 11/14/2015   OSA (obstructive  sleep apnea) 11/01/2015   Back muscle spasm 04/30/2015   DDD (degenerative disc disease), lumbar 04/30/2015   Lumbar radiculitis 04/30/2015   Low back pain 01/01/2015   Varicose vein of leg 11/22/2014   Increased frequency of urination 11/17/2013   Rectocele 11/17/2013   Cystocele 10/25/2013   Disorder of skin and subcutaneous tissue 10/25/2013   Midline cystocele 10/25/2013   Obesity, unspecified 10/19/2012   Dysuria 06/30/2012   History of  breast cancer 02/11/2012   Hyperlipidemia 05/11/2011   Osteopenia 05/11/2011   Disorder of bone and cartilage 05/11/2011   Anxiety and depression 03/04/2011   Hypertension 03/04/2011    Past Surgical History:  Procedure Laterality Date   ABDOMINAL HYSTERECTOMY     ANTERIOR AND POSTERIOR REPAIR     BREAST BIOPSY Right 2004   stereo. 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  3.5 cm maximum diameter, minimal margins 5 mm.  In situ component less than 10%.   BREAST LUMPECTOMY Right 2004   BREAST LUMPECTOMY Left 09/17/2017   Procedure: BREAST EXCISION, SENTINEL NODE BIOPSY;  Surgeon: Dessa Reyes ORN, MD;  Location: [facility] ORS;  Service: General;  Laterality: Left;   COLONOSCOPY  2015   COLONOSCOPY WITH PROPOFOL  N/A 10/04/2019   Procedure: COLONOSCOPY WITH PROPOFOL ;  Surgeon: Dessa Reyes ORN, MD;  Location: [facility] ENDOSCOPY;  Service: Endoscopy;  Laterality: N/A;   DILATION AND CURETTAGE OF UTERUS     ETHMOIDECTOMY Bilateral 11/05/2016   Procedure: ETHMOIDECTOMY;  Surgeon: Carlie Clark, MD;  Location: [facility] OR;  Service: ENT;  Laterality: Bilateral;   LAPAROTOMY     MAXILLARY ANTROSTOMY Bilateral 11/05/2016   Procedure: MAXILLARY ANTROSTOMY;  Surgeon: Carlie Clark, MD;  Location: [facility] OR;  Service: ENT;  Laterality: Bilateral;   SINUS ENDO W/FUSION Bilateral 11/05/2016   Procedure: FRONTAL RECESS EXPLORATION;  Surgeon: Carlie Clark, MD;  Location: [facility] OR;  Service: ENT;  Laterality: Bilateral;  BILATERAL ENDOSCOPIC SINUS SURGERY WITH FUSION   SINUS EXPLORATION  11/05/2016   SPHENOIDECTOMY Bilateral 11/05/2016   Procedure: SPHENOIDECTOMY;  Surgeon: Carlie Clark, MD;  Location: [facility] OR;  Service: ENT;  Laterality: Bilateral;    OB History     Gravida  2   Para      Term      Preterm      AB      Living          SAB      IAB      Ectopic      Multiple      Live Births  2        Obstetric Comments  1st Menstrual Cycle:  15 1st Pregnancy: 31            Home Medications    Prior to Admission medications   Medication Sig Start Date End Date Taking? Authorizing Provider  albuterol  (VENTOLIN  HFA) 108 (90 Base) MCG/ACT inhaler Inhale 2 puffs into the lungs every 6 (six) hours as needed for wheezing or shortness of breath. 12/26/22  Yes Sung, Jade J, MD  ALPRAZolam  (XANAX ) 0.25 MG tablet Take 1 tablet by mouth twice daily as needed for anxiety 04/20/24  Yes Kaur, Charanpreet, NP  amLODipine  (NORVASC ) 5 MG tablet Take 1 tablet (5 mg total) by mouth daily. 08/31/23  Yes Hope Merle, MD  celecoxib  (CELEBREX ) 100 MG capsule Take 1  capsule (100 mg total) by mouth 2 (two) times daily. 08/31/23  Yes Hope Merle, MD  cetirizine  [facility] ALLERGY RELIEF, CETIRIZINE ,) 10 MG tablet Take 1 tablet (10 mg total) by mouth daily. 08/31/23  Yes Hope Merle, MD  EQ ARTHRITIS PAIN RELIEVER 1 % GEL APPLY 2 GRAMS TOPICALLY FOUR TIMES DAILY AS NEEDED 04/17/22  Yes Maribeth Camellia MATSU, MD  FLUoxetine  (PROZAC ) 20 MG capsule Take 3 capsules (60 mg total) by mouth daily. 08/31/23  Yes Hope Merle, MD  fluticasone  (FLONASE ) 50 MCG/ACT nasal spray Place 1 spray into both nostrils daily. 02/17/24  Yes Marylynn Verneita CROME, MD  hydrochlorothiazide  (HYDRODIURIL ) 25 MG tablet Take 1 tablet by mouth once daily 02/24/24  Yes Gollan, Timothy J, MD  methocarbamol  (ROBAXIN ) 500 MG tablet Take 1 tablet (500 mg total) by mouth every 8 (eight) hours as needed for muscle spasms. 08/27/23  Yes Persons, Ronal Dragon, [place]  Multiple Vitamin (MULTI-VITAMIN) tablet Take 1 tablet by mouth daily.   Yes [provider]  Omega-3 Fatty Acids (FISH OIL PO) Take 1 tablet by mouth daily.   Yes [provider]  ondansetron  (ZOFRAN ) 4 MG tablet Take 1 tablet (4 mg total) by mouth every 8 (eight) hours as needed for nausea or vomiting. 03/17/24   Yes Kaur, Charanpreet, NP  pantoprazole  (PROTONIX ) 40 MG tablet Take 1 tablet (40 mg total) by mouth daily. 08/31/23  Yes Hope Merle, MD  rosuvastatin  (CRESTOR ) 40 MG tablet Take 1 tablet (40 mg total) by mouth daily. 08/31/23  Yes Hope Merle, MD  senna (SENOKOT) 8.6 MG TABS tablet Take 1 tablet by mouth at bedtime.    Yes [provider]  SUMAtriptan  (IMITREX ) 50 MG tablet Take 1 tablet (50 mg total) by mouth every 2 (two) hours as needed for migraine. May repeat in 2 hours if headache persists or recurs. No more than 2 doses in a 24 hour time frame. 03/17/24  Yes Kaur, Charanpreet, NP  traMADol  (ULTRAM ) 50 MG tablet TAKE 1 TABLET BY MOUTH ONCE DAILY AS NEEDED 04/20/24  Yes Kaur, Charanpreet, NP  tretinoin  (RETIN-A ) 0.05 % cream Apply 1 application topically at bedtime. 05/31/18  Yes Maribeth Camellia MATSU, MD  valsartan  (DIOVAN ) 320 MG tablet Take 1 tablet (320 mg total) by mouth daily. 08/31/23  Yes Hope Merle, MD  benzonatate  (TESSALON ) 100 MG capsule Take 1 capsule (100 mg total) by mouth every 8 (eight) hours. 05/04/24   Johnie Flaming A, NP  predniSONE  (DELTASONE ) 20 MG tablet Take 2 tablets (40 mg total) by mouth daily for 5 days. 05/04/24 05/09/24  Johnie Flaming LABOR, NP    Family History Family History  Problem Relation Age of Onset   Heart failure Mother    Heart disease Mother    Hyperlipidemia Mother    Hypertension Mother    Heart murmur Mother    Hypertension Father    Osteoarthritis Father    Stroke Father    Alzheimer's disease Sister    Alzheimer's disease Brother    Brain cancer Brother    Healthy Daughter    Healthy Son    Breast cancer Neg Hx     Social History Social History   Tobacco Use   Smoking status: Never   Smokeless tobacco: Never  Vaping Use   Vaping status: Never Used  Substance Use Topics   Alcohol use: Not Currently    Comment: rarely   Drug use: No     Allergies   Lidocaine , Codeine, Morphine , Niacin, and Niacin  and  related   Review of Systems Review of Systems  Respiratory:  Positive for shortness of breath.   Neurological:  Positive for headaches.   Per HPI  Physical Exam Triage Vital Signs ED Triage Vitals  Encounter Vitals Group     BP 05/04/24 1632 (!) 157/69     Girls Systolic BP Percentile --      Girls Diastolic BP Percentile --      Boys Systolic BP Percentile --      Boys Diastolic BP Percentile --      Pulse Rate 05/04/24 1632 80     Resp --      Temp 05/04/24 1632 98.7 F (37.1 C)     Temp Source 05/04/24 1632 Oral     SpO2 05/04/24 1632 96 %     Weight 05/04/24 1629 216 lb 12.8 oz (98.3 kg)     Height --      Head Circumference --      Peak Flow --      Pain Score 05/04/24 1630 7     Pain Loc --      Pain Education --      Exclude from Growth Chart --    No data found.  Updated Vital Signs BP (!) 157/69 (BP Location: Left Arm)   Pulse 80   Temp 98.7 F (37.1 C) (Oral)   Wt 216 lb 12.8 oz (98.3 kg)   SpO2 96%   BMI 37.21 kg/m   Visual Acuity Right Eye Distance:   Left Eye Distance:   Bilateral Distance:    Right Eye Near:   Left Eye Near:    Bilateral Near:     Physical Exam Vitals and nursing note reviewed.  Constitutional:      General: She is awake. She is not in acute distress.    Appearance: Normal appearance. She is well-developed and well-groomed. She is not ill-appearing.  HENT:     Right Ear: Tympanic membrane, ear canal and external ear normal.     Left Ear: Tympanic membrane, ear canal and external ear normal.     Nose: Congestion and rhinorrhea present.     Mouth/Throat:     Mouth: Mucous membranes are moist.     Pharynx: Posterior oropharyngeal erythema and postnasal drip present. No oropharyngeal exudate.  Cardiovascular:     Rate and Rhythm: Normal rate and regular rhythm.  Pulmonary:     Effort: Pulmonary effort is normal.     Breath sounds: Normal breath sounds.     Comments: Productive cough noted on exam. Skin:    General:  Skin is warm and dry.  Neurological:     General: No focal deficit present.     Mental Status: She is alert and oriented to person, place, and time. Mental status is at baseline.  Psychiatric:        Behavior: Behavior is cooperative.      UC Treatments / Results  Labs (all labs ordered are listed, but only abnormal results are displayed) Labs Reviewed  POCT INFLUENZA A/B - Normal  POC SOFIA SARS ANTIGEN FIA - Normal    EKG   Radiology No results found.  Procedures Procedures (including critical care time)  Medications Ordered in UC Medications - No data to display  Initial Impression / Assessment and Plan / UC Course  I have reviewed the triage vital signs and the nursing notes.  Pertinent labs & imaging results that were available during my care of the patient  were reviewed by me and considered in my medical decision making (see chart for details).     Patient is overall well-appearing.  Vitals are stable.  COVID and flu testing both negative.  Chest x-ray ordered.  I independently interpreted chest x-ray images and there is no focal consolidation to suggest underlying pneumonia.  Radiology report confirms this.  Symptoms likely viral in nature.  Prescribed short course of prednisone  to assist with shortness of breath.  Discussed when to use albuterol  inhaler.  Prescribed Tessalon  as needed for cough.  Discussed over-the-counter medications for symptoms.  Discussed follow-up and return precautions. Final Clinical Impressions(s) / UC Diagnoses   Final diagnoses:  Acute cough  Viral respiratory illness  Shortness of breath     Discharge Instructions      Your COVID and flu testing are both negative today.  Your x-ray did not reveal any evidence of pneumonia. I believe you likely have a viral respiratory illness. Due to your shortness of breath I prescribed prednisone  for you to take over the next 5 days.  You will take 2 tablets once daily for 5 days. I have  prescribed Tessalon  that you can take every 8 hours as needed for cough. Continue using your albuterol  inhaler every 4-6 hours as needed for shortness of breath and wheezing. You can take over-the-counter Coricidin for cough and congestion.  This medication is safe with your history of high blood pressure. Follow-up with your primary care provider or return here as needed. If you develop worsening shortness of breath, significant chest pain, persistent fevers, or severe weakness please seek immediate medical treatment in the emergency department.     ED Prescriptions     Medication Sig Dispense Auth. Provider   predniSONE  (DELTASONE ) 20 MG tablet  (Status: Discontinued) Take 2 tablets (40 mg total) by mouth daily for 5 days. 10 tablet Johnie Flaming A, NP   benzonatate  (TESSALON ) 100 MG capsule  (Status: Discontinued) Take 1 capsule (100 mg total) by mouth every 8 (eight) hours. 21 capsule Johnie Flaming A, NP   benzonatate  (TESSALON ) 100 MG capsule Take 1 capsule (100 mg total) by mouth every 8 (eight) hours. 21 capsule Johnie Flaming A, NP   predniSONE  (DELTASONE ) 20 MG tablet Take 2 tablets (40 mg total) by mouth daily for 5 days. 10 tablet Johnie Flaming A, NP      PDMP not reviewed this encounter.   Johnie Flaming A, NP 05/04/24 1806

## 2024-05-15 ENCOUNTER — Ambulatory Visit

## 2024-05-15 ENCOUNTER — Ambulatory Visit (INDEPENDENT_AMBULATORY_CARE_PROVIDER_SITE_OTHER): Admitting: Family

## 2024-05-15 ENCOUNTER — Encounter: Payer: Self-pay | Admitting: Family

## 2024-05-15 VITALS — BP 128/78 | HR 75 | Temp 98.5°F | Ht 64.0 in | Wt 216.0 lb

## 2024-05-15 DIAGNOSIS — R911 Solitary pulmonary nodule: Secondary | ICD-10-CM

## 2024-05-15 DIAGNOSIS — I10 Essential (primary) hypertension: Secondary | ICD-10-CM

## 2024-05-15 DIAGNOSIS — Z13220 Encounter for screening for lipoid disorders: Secondary | ICD-10-CM

## 2024-05-15 DIAGNOSIS — G43809 Other migraine, not intractable, without status migrainosus: Secondary | ICD-10-CM

## 2024-05-15 DIAGNOSIS — G8929 Other chronic pain: Secondary | ICD-10-CM | POA: Diagnosis not present

## 2024-05-15 DIAGNOSIS — M51369 Other intervertebral disc degeneration, lumbar region without mention of lumbar back pain or lower extremity pain: Secondary | ICD-10-CM

## 2024-05-15 DIAGNOSIS — F419 Anxiety disorder, unspecified: Secondary | ICD-10-CM | POA: Diagnosis not present

## 2024-05-15 DIAGNOSIS — M545 Low back pain, unspecified: Secondary | ICD-10-CM | POA: Diagnosis not present

## 2024-05-15 DIAGNOSIS — J40 Bronchitis, not specified as acute or chronic: Secondary | ICD-10-CM

## 2024-05-15 DIAGNOSIS — F32A Depression, unspecified: Secondary | ICD-10-CM | POA: Diagnosis not present

## 2024-05-15 DIAGNOSIS — Z1231 Encounter for screening mammogram for malignant neoplasm of breast: Secondary | ICD-10-CM

## 2024-05-15 DIAGNOSIS — R32 Unspecified urinary incontinence: Secondary | ICD-10-CM

## 2024-05-15 DIAGNOSIS — R200 Anesthesia of skin: Secondary | ICD-10-CM | POA: Diagnosis not present

## 2024-05-15 LAB — COMPREHENSIVE METABOLIC PANEL WITH GFR
ALT: 21 U/L (ref 0–35)
AST: 23 U/L (ref 0–37)
Albumin: 4 g/dL (ref 3.5–5.2)
Alkaline Phosphatase: 63 U/L (ref 39–117)
BUN: 14 mg/dL (ref 6–23)
CO2: 33 meq/L — ABNORMAL HIGH (ref 19–32)
Calcium: 9.2 mg/dL (ref 8.4–10.5)
Chloride: 100 meq/L (ref 96–112)
Creatinine, Ser: 0.7 mg/dL (ref 0.40–1.20)
GFR: 83.81 mL/min (ref >60.00)
Glucose, Bld: 98 mg/dL (ref 70–99)
Potassium: 3.9 meq/L (ref 3.5–5.1)
Sodium: 140 meq/L (ref 135–145)
Total Bilirubin: 0.4 mg/dL (ref 0.2–1.2)
Total Protein: 6.8 g/dL (ref 6.0–8.3)

## 2024-05-15 LAB — CBC WITH DIFFERENTIAL/PLATELET
Basophils Absolute: 0.1 K/uL (ref 0.0–0.1)
Basophils Relative: 0.6 % (ref 0.0–3.0)
Eosinophils Absolute: 0.3 K/uL (ref 0.0–0.7)
Eosinophils Relative: 3.2 % (ref 0.0–5.0)
HCT: 35.3 % — ABNORMAL LOW (ref 36.0–46.0)
Hemoglobin: 12 g/dL (ref 12.0–15.0)
Lymphocytes Relative: 17.2 % (ref 12.0–46.0)
Lymphs Abs: 1.8 K/uL (ref 0.7–4.0)
MCHC: 33.9 g/dL (ref 30.0–36.0)
MCV: 92.2 fl (ref 78.0–100.0)
Monocytes Absolute: 0.9 K/uL (ref 0.1–1.0)
Monocytes Relative: 8.4 % (ref 3.0–12.0)
Neutro Abs: 7.2 K/uL (ref 1.4–7.7)
Neutrophils Relative %: 70.6 % (ref 43.0–77.0)
Platelets: 217 K/uL (ref 150.0–400.0)
RBC: 3.83 Mil/uL — ABNORMAL LOW (ref 3.87–5.11)
RDW: 15.5 % (ref 11.5–15.5)
WBC: 10.2 K/uL (ref 4.0–10.5)

## 2024-05-15 LAB — LIPID PANEL
Cholesterol: 160 mg/dL (ref 0–200)
HDL: 64.6 mg/dL (ref >39.00)
LDL Cholesterol: 67 mg/dL (ref 0–99)
NonHDL: 94.9
Total CHOL/HDL Ratio: 2
Triglycerides: 140 mg/dL (ref 0.0–149.0)
VLDL: 28 mg/dL (ref 0.0–40.0)

## 2024-05-15 LAB — B12 AND FOLATE PANEL
Folate: 15.2 ng/mL (ref >5.9)
Vitamin B-12: 337 pg/mL (ref 211–911)

## 2024-05-15 LAB — TSH: TSH: 4.14 u[IU]/mL (ref 0.35–5.50)

## 2024-05-15 LAB — VITAMIN D 25 HYDROXY (VIT D DEFICIENCY, FRACTURES): VITD: 25.23 ng/mL — ABNORMAL LOW (ref 30.00–100.00)

## 2024-05-15 MED ORDER — BUSPIRONE HCL 5 MG PO TABS: 5.0000 mg | ORAL_TABLET | Freq: Three times a day (TID) | ORAL | 1 refills | Status: AC | PRN

## 2024-05-15 MED ORDER — DICLOFENAC SODIUM 1 % EX GEL: 4.0000 g | Freq: Four times a day (QID) | CUTANEOUS | 3 refills | Status: DC

## 2024-05-15 MED ORDER — TRAMADOL HCL 50 MG PO TABS: 50.0000 mg | ORAL_TABLET | Freq: Every day | ORAL | 1 refills | Status: AC | PRN

## 2024-05-15 MED ORDER — ONDANSETRON 4 MG PO TBDP: 4.0000 mg | ORAL_TABLET | Freq: Three times a day (TID) | ORAL | 1 refills | Status: AC | PRN

## 2024-05-15 MED ORDER — ALPRAZOLAM 0.25 MG PO TABS: 0.2500 mg | ORAL_TABLET | Freq: Two times a day (BID) | ORAL | 1 refills | Status: AC | PRN

## 2024-05-15 MED ORDER — AZITHROMYCIN 250 MG PO TABS: ORAL_TABLET | ORAL | 0 refills | Status: AC

## 2024-05-15 NOTE — Progress Notes (Unsigned)
 Assessment & Plan:  Pulmonary nodule Assessment & Plan: Ct chest 03/2023 Pulmonary nodule   Bilateral pulmonary nodules are noted, the largest measuring 4 mm   With a history of breast cancer, there is a discussion about potential follow-up imaging to rule out malignancy. Pulmonary nodule follow-up is discussed with a pulmonologist. Patient declines my ordering CT chest. She would like to discuss with  pulmonology   Encounter for screening mammogram for malignant neoplasm of breast -     3D Screening Mammogram, Left and Right; Future  Urinary incontinence, unspecified type -     For home use only DME Other see comment  Other migraine without status migrainosus, not intractable Assessment & Plan: Chronic, poorly controlled. Stop Imitrex  due to cardiovascular risks.  Zofran  is prescribed for nausea, magnesium  supplementation is recommended, and Tylenol  is advised for headache management.  Advised to follow-up with pulmonology for BiPAP management . Will follow.   Orders: -     Ondansetron ; Take 1 tablet (4 mg total) by mouth every 8 (eight) hours as needed for nausea or vomiting.  Dispense: 30 tablet; Refill: 1 -     CBC with Differential/Platelet -     TSH  Degeneration of intervertebral disc of lumbar region, unspecified whether pain present -     traMADol  HCl; Take 1 tablet (50 mg total) by mouth daily as needed.  Dispense: 30 tablet; Refill: 1 -     DG Cervical Spine Complete; Future -     B12 and Folate Panel -     Diclofenac  Sodium; Apply 4 g topically 4 (four) times daily. Follow directions on bottle  Dispense: 20 g; Refill: 3  Right arm numbness Assessment & Plan: She experiences intermittent right arm numbness, with a differential diagnosis of cervical radiculopathy versus carpal tunnel syndrome. A cervical spine x-ray and B12 lab are ordered to rule out deficiency.  Orders: -     DG Cervical Spine Complete; Future -     B12 and Folate Panel -     CBC with  Differential/Platelet -     TSH -     VITAMIN D  25 Hydroxy (Vit-D Deficiency, Fractures)  Bronchitis Assessment & Plan: No acute respiratory distress.  Reported history of asthma.  persistent productive cough, greenish-yellow sputum, worsening fatigue, and episodic shortness of breath. A chest x-ray is ordered to rule out pneumonia, and azithromycin  (Z-Pak) is prescribed. She completed prednisone .   Orders: -     CBC with Differential/Platelet -     Comprehensive metabolic panel with GFR -     DG Chest 2 View; Future -     Azithromycin ; Take 2 tablets on day 1, then 1 tablet daily on days 2 through 5  Dispense: 6 tablet; Refill: 0  Anxiety and depression Assessment & Plan: Chronic, suboptimal control. Anxiety and depression are managed with Xanax  and Prozac . Shared concerns about Xanax 's habit-forming potential and effects on memory . Trial of Buspar .  Counseled on minimizing use of Xanax  and monitoring for sedation.  Continue Prozac  60mg  every day, xanax  0.25 mg twice daily as needed.  Orders: -     ALPRAZolam ; Take 1 tablet (0.25 mg total) by mouth 2 (two) times daily as needed. for anxiety  Dispense: 30 tablet; Refill: 1 -     busPIRone  HCl; Take 1 tablet (5 mg total) by mouth 3 (three) times daily as needed.  Dispense: 60 tablet; Refill: 1  Encounter for lipid screening for cardiovascular disease -  Lipid panel  Chronic left-sided low back pain without sciatica Assessment & Plan:  Chronic back pain with radiculopathy.  Advised to stop celebrex  due to cardiovascular risk. Advised to increased Tylenol  1000 mg three times daily.  She may continue Voltaren  gel and advised she must follow OTC instructions on container and measure.   refill tramadol  50 mg to take daily as needed.  Daughter and concerns about the sedation risk from tramadol  and Xanax  and to separate use of these medications by several hours.  She understands not to take these medications close together. Counseled on  sedation.  Consider gabapentin  at follow up.    Primary hypertension Assessment & Plan: Blood pressure well-controlled.  Continue Compliant with valsartan  320 mg daily, amlodipine  5 mg, hydrochlorothiazide  25mg  every day Overdue for follow-up with cardiology, she will call to schedule      Return precautions given.   Risks, benefits, and alternatives of the medications and treatment plan prescribed today were discussed, and patient expressed understanding.   Education regarding symptom management and diagnosis given to patient on AVS either electronically or printed.  No follow-ups on file.  Rollene Northern, FNP  Subjective:    Patient ID: Dana Obrien, female    DOB: 07-02-1945, 78 y.o.   MRN: [id number]  CC: Dana Obrien is a 78 y.o. female who presents today to establish care.    HPI: HPI  Discussed the use of AI scribe software for clinical note transcription with the patient, who gave verbal consent to proceed.  History of Present Illness   Dana Obrien is a 78 year old female who presents with cough She is accompanied by her daughter, Dana Obrien.  She has been experiencing respiratory symptoms since December 1st or 2nd, characterized by chest and head tightness, and a productive cough with greenish-yellow thick mucus. She completed a five-day course of prednisone  on December 4th, but her symptoms have persisted and worsened, with increased coughing and fatigue. She uses quercetin as a cough suppressant and an inhaler twice daily, which helps. She also uses Flonase .  Shortness of breath noted with sitting dressed.  No shortness of breath when came in the office today or when speaking. Denies fever, leg swelling.   She experiences episodic tingling and numbness in her left arm, starting about six weeks ago. The sensation begins at the shoulder and extends to the tips of all five fingers, resembling the feeling of an arm 'falling asleep.' This numbness is  not present every day and does not seem to be activity-driven.  She has a history of severe sleep apnea and uses a BiPAP machine. She experiences fatigue and headaches, which she associates with her sleep apnea. She used to wake up with headaches every morning before starting treatment for sleep apnea.  She suffers from chronic headaches and migraines, for which she takes Imitrex . Her migraines are accompanied by nausea and sensitivity to light. Denies vision loss.    She experiences chronic back pain and takes tramadol  as needed, along with Celebrex  and Tylenol  for pain management. She also uses Voltaren  gel for her back pain.  She takes Xanax  primarily at night to help with sleep, especially after experiencing significant family loss this year. She also takes Benadryl occasionally to aid sleep.  She is on multiple blood pressure medications, including hydrochlorothiazide , losartan , and amlodipine .      H/o asthma     Never smoker  Seen 05/04/24 for URI , started on prednisone   07/13/22 MRI lumbar  IMPRESSION: 1. Scoliotic curvature convex to the right with the apex at L2. 2. L1-2 and L2-3: Left-sided predominant disc bulges and facet degeneration with mild narrowing of the left lateral recess and intervertebral foramen on the left but without definite neural compression. Slight worsening since 2016. 3. L3-4: Disc bulge and facet degeneration more prominent on the right. Stenosis of the lateral recess on the right that could possibly affect the right L4 nerve. Slight worsening since 2016. 4. L4-5: Facet arthropathy with 1 mm of degenerative anterolisthesis. Bulging of the disc more prominent on the right. Stenosis of the right lateral recess and intervertebral foramen on the right that could cause right-sided neural compression. Minimal worsening since 2016. 5. L5-S1: Disc bulge and facet degeneration. Mild bilateral foraminal narrowing but without visible neural compression.  Minimal worsening since 2016. 6. T10-11 and T11-12: Hypertrophic change and calcification of the facet joints and ligaments, particularly on the right, encroaching upon the spinal canal. Some potential that this is causing some cord deformity particularly at the T10-11 level. Consider thoracic imaging to evaluate this further.     History of breast cancer left dx 2019, last seen by Dr. Melanee 11/02/2022  Mammogram 03/22/24 Per protocol, as the patient is now 2 or more years status post lumpectomy, she may return to annual screening mammography in 1 year. However, given the history of breast cancer, the patient remains eligible for annual diagnostic mammography if preferred.  Last seen by pulmonology 01/14/2023 for severe OSA, Dr. Isaiah.   Initial consult cardiology 01/12/2023, Dr Gollan for CP CTA obtained 03/11/23 Very low calcium  score of 5  No significant coronary disease, minimal nonobstructive disease noted  Overall excellent result, no further workup needed   Bilateral pulmonary nodules are noted, the largest measuring 4 mm. No follow-up needed if patient is low-risk (and has no known or suspected primary neoplasm). Non-contrast chest CT can be considered in 12 months if patient is high-risk. This recommendation follows the consensus statement: Guidelines for Management of Incidental Pulmonary Nodules Detected on CT Images: From the Fleischner Society 2017; Radiology 2017; 284:228-243.  Continued on hydrochlorothiazide  25 mg daily, valsartan  360 mg daily.  Lasix  as needed for worsening leg swelling  CT head 12/17/22  Mild generalized cerebral atrophy and chronic microvascular ischemic changes of the periventricular white matter.         Allergies: Lidocaine , Codeine, Morphine , Niacin, and Niacin and related Medications Ordered Prior to Encounter[1]  Review of Systems  Constitutional:  Negative for chills and fever.  HENT:  Positive for congestion.   Respiratory:   Positive for cough. Negative for shortness of breath and wheezing.   Cardiovascular:  Negative for chest pain and palpitations.  Gastrointestinal:  Negative for nausea and vomiting.  Musculoskeletal:  Positive for back pain.  Neurological:  Positive for numbness (left arm) and headaches.      Objective:    BP 128/78   Pulse 75   Temp 98.5 F (36.9 C) (Oral)   Ht 5' 4 (1.626 m)   Wt 216 lb (98 kg)   SpO2 99%   BMI 37.08 kg/m  BP Readings from Last 3 Encounters:  05/17/24 128/78  05/15/24 128/78  05/04/24 (!) 157/69   Wt Readings from Last 3 Encounters:  05/17/24 216 lb (98 kg)  05/15/24 216 lb (98 kg)  05/04/24 216 lb 12.8 oz (98.3 kg)    Physical Exam Vitals reviewed.  Constitutional:      Appearance: She is well-developed.  HENT:  Head: Normocephalic and atraumatic.     Right Ear: Hearing, tympanic membrane, ear canal and external ear normal. No decreased hearing noted. No drainage, swelling or tenderness. No middle ear effusion. No foreign body. Tympanic membrane is not erythematous or bulging.     Left Ear: Hearing, tympanic membrane, ear canal and external ear normal. No decreased hearing noted. No drainage, swelling or tenderness.  No middle ear effusion. No foreign body. Tympanic membrane is not erythematous or bulging.     Nose: Nose normal. No rhinorrhea.     Right Sinus: No maxillary sinus tenderness or frontal sinus tenderness.     Left Sinus: No maxillary sinus tenderness or frontal sinus tenderness.     Mouth/Throat:     Pharynx: Uvula midline. No oropharyngeal exudate or posterior oropharyngeal erythema.     Tonsils: No tonsillar abscesses.  Eyes:     Conjunctiva/sclera: Conjunctivae normal.  Neck:     Thyroid : No thyromegaly or thyroid  tenderness.     Comments: BUE grip strength 5/5 Cardiovascular:     Rate and Rhythm: Normal rate and regular rhythm.     Pulses: Normal pulses.     Heart sounds: Normal heart sounds.  Pulmonary:     Effort:  Pulmonary effort is normal.     Breath sounds: Normal breath sounds. No wheezing, rhonchi or rales.  Musculoskeletal:     Cervical back: No torticollis. No pain with movement, spinous process tenderness or muscular tenderness.  Lymphadenopathy:     Head:     Right side of head: No submental, submandibular, tonsillar, preauricular, posterior auricular or occipital adenopathy.     Left side of head: No submental, submandibular, tonsillar, preauricular, posterior auricular or occipital adenopathy.     Cervical: No cervical adenopathy.  Skin:    General: Skin is warm and dry.  Neurological:     Mental Status: She is alert.  Psychiatric:        Speech: Speech normal.        Behavior: Behavior normal.        Thought Content: Thought content normal.            [1]  Current Outpatient Medications on File Prior to Visit  Medication Sig Dispense Refill   albuterol  (VENTOLIN  HFA) 108 (90 Base) MCG/ACT inhaler Inhale 2 puffs into the lungs every 6 (six) hours as needed for wheezing or shortness of breath. 8 g 2   amLODipine  (NORVASC ) 5 MG tablet Take 1 tablet (5 mg total) by mouth daily. 90 tablet 3   cetirizine  (EQ ALLERGY RELIEF, CETIRIZINE ,) 10 MG tablet Take 1 tablet (10 mg total) by mouth daily. 90 tablet 3   EQ ARTHRITIS PAIN RELIEVER 1 % GEL APPLY 2 GRAMS TOPICALLY FOUR TIMES DAILY AS NEEDED 50 g 0   FLUoxetine  (PROZAC ) 20 MG capsule Take 3 capsules (60 mg total) by mouth daily. 270 capsule 3   fluticasone  (FLONASE ) 50 MCG/ACT nasal spray Place 1 spray into both nostrils daily. 16 g 0   hydrochlorothiazide  (HYDRODIURIL ) 25 MG tablet Take 1 tablet by mouth once daily 30 tablet 0   methocarbamol  (ROBAXIN ) 500 MG tablet Take 1 tablet (500 mg total) by mouth every 8 (eight) hours as needed for muscle spasms. 30 tablet 0   Multiple Vitamin (MULTI-VITAMIN) tablet Take 1 tablet by mouth daily.     ondansetron  (ZOFRAN ) 4 MG tablet Take 1 tablet (4 mg total) by mouth every 8 (eight) hours as  needed for nausea or vomiting. 20 tablet 0  pantoprazole  (PROTONIX ) 40 MG tablet Take 1 tablet (40 mg total) by mouth daily. 90 tablet 3   rosuvastatin  (CRESTOR ) 40 MG tablet Take 1 tablet (40 mg total) by mouth daily. 90 tablet 3   senna (SENOKOT) 8.6 MG TABS tablet Take 1 tablet by mouth at bedtime.      tretinoin  (RETIN-A ) 0.05 % cream Apply 1 application topically at bedtime. 45 g 0   valsartan  (DIOVAN ) 320 MG tablet Take 1 tablet (320 mg total) by mouth daily. 90 tablet 3   Omega-3 Fatty Acids (FISH OIL PO) Take 1 tablet by mouth daily. (Patient not taking: Reported on 05/17/2024)     No current facility-administered medications on file prior to visit.

## 2024-05-15 NOTE — Patient Instructions (Addendum)
 Trial of voltaren  gel   The gel should be applied within the rectangular area of the dosing card up to the 2 gram or 4 gram line (2 g for each elbow, wrist, or hand, and 4 g for each knee, ankle, or foot). The 2 g line is 2.25 inches long. The 4 g line is 4.5 inches long. The dosing card containing VOLTAREN  GEL can be used to apply the gel.    Trial of buspar  for anxiety as needed as scheduled  Minimize xanax  as discussed  Trial stop celebrex  due to GI side effects  Schedule tylenol  1000mg  twice to three times daily  No more than 4000mg  tylenol  from ALL sources in 2 hours  STOP imitrex   Instead for rescue , use zofran  ( nausea) .  Magnesium : Recommend beginning Magnesium  citrate or glycinate supplements 400-600 mg per day. Magnesium  can be used as preventive treatment for migraine and other types of headaches. It has many benefits of relatively side-effect free, safe in pregnancy, relatively inexpensive, highly effective in some patients. Magnesium  citrate is used typically 400-600 mg/day. It can cause diarrhea in some patients. Magnesium  glycinate is better tolerated by GI system but it is hard to find (may have to look up online or health food store). We gave printed material if needed.    Please let me know how you are doing.     Please call to schedule pulmonology follow-up and discuss Bilateral pulmonary nodules seen CTA 03/2023 as we discussed.   Dr Isaiah  [address], [place], [place] [address] Phone: [phone]  Schedule follow up with cardiology  Dr Perla , cardiology   [id number], [facility] [address], [place], [place] [address] Phone: [phone]  [facility]   [address], [place], [place] [address] Phone: [phone]   Please call  and schedule your 3D mammogram and /or bone density scan as we discussed.   [facility]  ( new location in 2023)  [address], [place], [place] [address]  [place],  [place]  [phone]   Referral to lung nodule clinic  Let us  know if you dont hear back within 2 weeks in regards to an appointment being scheduled.   So that you are aware, if you are [facility] MyChart user , please pay attention to your MyChart messages as you may receive a MyChart message with a phone number to call and schedule this test/appointment own your own from our referral coordinator. This is a new process so I do not want you to miss this message.  If you are not a MyChart user, you will receive a phone call.

## 2024-05-17 ENCOUNTER — Ambulatory Visit: Payer: Self-pay | Admitting: Family

## 2024-05-17 ENCOUNTER — Ambulatory Visit

## 2024-05-17 ENCOUNTER — Telehealth: Payer: Self-pay | Admitting: Family

## 2024-05-17 VITALS — BP 128/78 | Ht 64.0 in | Wt 216.0 lb

## 2024-05-17 DIAGNOSIS — Z0000 Encounter for general adult medical examination without abnormal findings: Secondary | ICD-10-CM

## 2024-05-17 NOTE — Telephone Encounter (Signed)
 Pt did not schedule follow up with me after visit this month  She needs visit sch 4-6 weeks after our appt

## 2024-05-17 NOTE — Telephone Encounter (Addendum)
 LVM to call back to office to schedule f/up appt in 4-6weeks

## 2024-05-17 NOTE — Patient Instructions (Addendum)
 Dana Obrien,  Thank you for taking the time for your Medicare Wellness Visit. I appreciate your continued commitment to your health goals. Please review the care plan we discussed, and feel free to reach out if I can assist you further.  Please note that Annual Wellness Visits do not include a physical exam. Some assessments may be limited, especially if the visit was conducted virtually. If needed, we may recommend an in-person follow-up with your provider.  Ongoing Care Seeing your primary care provider every 3 to 6 months helps us  monitor your health and provide consistent, personalized care.  Consider updating your vaccines. Remember to call and schedule your mammogram.   Managing Pain Without Opioids Opioids are strong medicines used to treat moderate to severe pain. For some people, especially those who have long-term (chronic) pain, opioids may not be the best choice for pain management due to: Side effects like nausea, constipation, and sleepiness. The risk of addiction (opioid use disorder). The longer you take opioids, the greater your risk of addiction. Pain that lasts for more than 3 months is called chronic pain. Managing chronic pain usually requires more than one approach and is often provided by a team of health care providers working together (multidisciplinary approach). Pain management may be done at a pain management center or pain clinic. How to manage pain without the use of opioids Use non-opioid medicines Non-opioid medicines for pain may include: Over-the-counter or prescription non-steroidal anti-inflammatory drugs (NSAIDs). These may be the first medicines used for pain. They work well for muscle and bone pain, and they reduce swelling. Acetaminophen . This over-the-counter medicine may work well for milder pain but not swelling. Antidepressants. These may be used to treat chronic pain. A certain type of antidepressant (tricyclics) is often used. These medicines are  given in lower doses for pain than when used for depression. Anticonvulsants. These are usually used to treat seizures but may also reduce nerve (neuropathic) pain. Muscle relaxants. These relieve pain caused by sudden muscle tightening (spasms). You may also use a pain medicine that is applied to the skin as a patch, cream, or gel (topical analgesic), such as a numbing medicine. These may cause fewer side effects than medicines taken by mouth. Do certain therapies as directed Some therapies can help with pain management. They include: Physical therapy. You will do exercises to gain strength and flexibility. A physical therapist may teach you exercises to move and stretch parts of your body that are weak, stiff, or painful. You can learn these exercises at physical therapy visits and practice them at home. Physical therapy may also involve: Massage. Heat wraps or applying heat or cold to affected areas. Electrical signals that interrupt pain signals (transcutaneous electrical nerve stimulation, TENS). Weak lasers that reduce pain and swelling (low-level laser therapy). Signals from your body that help you learn to regulate pain (biofeedback). Occupational therapy. This helps you to learn ways to function at home and work with less pain. Recreational therapy. This involves trying new activities or hobbies, such as a physical activity or drawing. Mental health therapy, including: Cognitive behavioral therapy (CBT). This helps you learn coping skills for dealing with pain. Acceptance and commitment therapy (ACT) to change the way you think and react to pain. Relaxation therapies, including muscle relaxation exercises and mindfulness-based stress reduction. Pain management counseling. This may be individual, family, or group counseling.  Receive medical treatments Medical treatments for pain management include: Nerve block injections. These may include a pain blocker and anti-inflammatory  medicines.  You may have injections: Near the spine to relieve chronic back or neck pain. Into joints to relieve back or joint pain. Into nerve areas that supply a painful area to relieve body pain. Into muscles (trigger point injections) to relieve some painful muscle conditions. A medical device placed near your spine to help block pain signals and relieve nerve pain or chronic back pain (spinal cord stimulation device). Acupuncture. Follow these instructions at home Medicines Take over-the-counter and prescription medicines only as told by your health care provider. If you are taking pain medicine, ask your health care providers about possible side effects to watch out for. Do not drive or use heavy machinery while taking prescription opioid pain medicine. Lifestyle  Do not use drugs or alcohol to reduce pain. If you drink alcohol, limit how much you have to: 0-1 drink a day for women who are not pregnant. 0-2 drinks a day for men. Know how much alcohol is in a drink. In the [place] one drink equals one 12 oz bottle of beer (355 mL), one 5 oz glass of wine (148 mL), or one 1 oz glass of hard liquor (44 mL). Do not use any products that contain nicotine or tobacco. These products include cigarettes, chewing tobacco, and vaping devices, such as e-cigarettes. If you need help quitting, ask your health care provider. Eat a healthy diet and maintain a healthy weight. Poor diet and excess weight may make pain worse. Eat foods that are high in fiber. These include fresh fruits and vegetables, whole grains, and beans. Limit foods that are high in fat and processed sugars, such as fried and sweet foods. Exercise regularly. Exercise lowers stress and may help relieve pain. Ask your health care provider what activities and exercises are safe for you. If your health care provider approves, join an exercise class that combines movement and stress reduction. Examples include yoga and tai chi. Get enough  sleep. Lack of sleep may make pain worse. Lower stress as much as possible. Practice stress reduction techniques as told by your therapist. General instructions Work with all your pain management providers to find the treatments that work best for you. You are an important member of your pain management team. There are many things you can do to reduce pain on your own. Consider joining an online or in-person support group for people who have chronic pain. Keep all follow-up visits. This is important. Where to find more information You can find more information about managing pain without opioids from: American Academy of Pain Medicine: painmed.org Institute for Chronic Pain: instituteforchronicpain.org American Chronic Pain Association: theacpa.org Contact a health care provider if: You have side effects from pain medicine. Your pain gets worse or does not get better with treatments or home therapy. You are struggling with anxiety or depression. Summary Many types of pain can be managed without opioids. Chronic pain may respond better to pain management without opioids. Pain is best managed when you and a team of health care providers work together. Pain management without opioids may include non-opioid medicines, medical treatments, physical therapy, mental health therapy, and lifestyle changes. Tell your health care providers if your pain gets worse or is not being managed well enough. This information is not intended to replace advice given to you by your health care provider. Make sure you discuss any questions you have with your health care provider. Document Revised: 08/28/2020 Document Reviewed: 08/28/2020 Elsevier Patient Education  2024 Elsevier Inc.  Referrals If a referral was made during  today's visit and you haven't received any updates within two weeks, please contact the referred provider directly to check on the status.  Recommended Screenings:  Health Maintenance   Topic Date Due   Zoster (Shingles) Vaccine (1 of 2) Never done   COVID-19 Vaccine (3 - Pfizer risk series) 07/29/2019   Breast Cancer Screening  03/22/2024   Flu Shot  08/29/2024*   Colon Cancer Screening  10/03/2024   Medicare Annual Wellness Visit  05/17/2025   DTaP/Tdap/Td vaccine (4 - Td or Tdap) 12/16/2032   Pneumococcal Vaccine for age over 62  Completed   Osteoporosis screening with Bone Density Scan  Completed   Hepatitis C Screening  Completed   Meningitis B Vaccine  Aged Out  *Topic was postponed. The date shown is not the original due date.       05/17/2024    9:35 AM  Advanced Directives  Does Patient Have a Medical Advance Directive? Yes  Type of Estate Agent of [place];Living will  Does patient want to make changes to medical advance directive? No - Patient declined  Copy of Healthcare Power of Attorney in Chart? No - copy requested    Vision: Annual vision screenings are recommended for early detection of glaucoma, cataracts, and diabetic retinopathy. These exams can also reveal signs of chronic conditions such as diabetes and high blood pressure.  Dental: Annual dental screenings help detect early signs of oral cancer, gum disease, and other conditions linked to overall health, including heart disease and diabetes.  Please see the attached documents for additional preventive care recommendations.

## 2024-05-17 NOTE — Progress Notes (Signed)
 Chief Complaint  Patient presents with   Medicare Wellness     Subjective:   Dana Obrien is a 78 y.o. female who presents for a Medicare Annual Wellness Visit.  Visit info / Clinical Intake: Medicare Wellness Visit Type:: Subsequent Annual Wellness Visit Persons participating in visit and providing information:: patient Medicare Wellness Visit Mode:: Telephone If telephone:: video declined Since this visit was completed virtually, some vitals may be partially provided or unavailable. Missing vitals are due to the limitations of the virtual format.: Documented vitals are patient reported If Telephone or Video please confirm:: I connected with patient using audio/video enable telemedicine. I verified patient identity with two identifiers, discussed telehealth limitations, and patient agreed to proceed. Patient Location:: Home Provider Location:: Office/Home Interpreter Needed?: No Pre-visit prep was completed: yes AWV questionnaire completed by patient prior to visit?: no Living arrangements:: with family/others Patient's Overall Health Status Rating: (!) fair Typical amount of pain: (!) a lot (chronic pain arthtritis  for about 5 years) Does pain affect daily life?: (!) yes Are you currently prescribed opioids?: (!) yes  Dietary Habits and Nutritional Risks How many meals a day?: 3 Eats fruit and vegetables daily?: yes Most meals are obtained by: preparing own meals In the last 2 weeks, have you had any of the following?: none Diabetic:: no  Functional Status Activities of Daily Living (to include ambulation/medication): Independent Ambulation: Independent with device- listed below Home Assistive Devices/Equipment: Cane (occassionally) Medication Administration: Independent Home Management (perform basic housework or laundry): Independent Manage your own finances?: yes Primary transportation is: driving Concerns about vision?: no *vision screening is required for  WTM* Concerns about hearing?: (!) yes Uses hearing aids?: (!) yes Hear whispered voice?: -- (telehealth)  Fall Screening Falls in the past year?: 1 Number of falls in past year: 0 Was there an injury with Fall?: 1 (hit head, went to urgent care) Fall Risk Category Calculator: 2 Patient Fall Risk Level: Moderate Fall Risk  Fall Risk Patient at Risk for Falls Due to: History of fall(s); Impaired balance/gait (tripped over a rug that was rolled up) Fall risk Follow up: Falls evaluation completed; Education provided; Falls prevention discussed  Home and Transportation Safety: All rugs have non-skid backing?: yes All stairs or steps have railings?: yes Grab bars in the bathtub or shower?: yes Have non-skid surface in bathtub or shower?: yes Good home lighting?: yes Regular seat belt use?: yes Hospital stays in the last year:: no  Cognitive Assessment Difficulty concentrating, remembering, or making decisions? : no Will 6CIT or Mini Cog be Completed: yes What year is it?: 0 points What month is it?: 0 points Give patient an address phrase to remember (5 components): [address] [place] About what time is it?: 0 points Count backwards from 20 to 1: 0 points Say the months of the year in reverse: 0 points Repeat the address phrase from earlier: 0 points 6 CIT Score: 0 points  Advance Directives (For Healthcare) Does Patient Have a Medical Advance Directive?: Yes Does patient want to make changes to medical advance directive?: No - Patient declined Type of Advance Directive: Healthcare Power of [place]; Living will Copy of Healthcare Power of Attorney in Chart?: No - copy requested Copy of Living Will in Chart?: No - copy requested  Reviewed/Updated  Reviewed/Updated: Reviewed All (Medical, Surgical, Family, Medications, Allergies, Care Teams, Patient Goals)    Allergies (verified) Lidocaine , Codeine, Morphine , Niacin, and Niacin and related   Current Medications  (verified) Outpatient Encounter  Medications as of 05/17/2024  Medication Sig   albuterol  (VENTOLIN  HFA) 108 (90 Base) MCG/ACT inhaler Inhale 2 puffs into the lungs every 6 (six) hours as needed for wheezing or shortness of breath.   ALPRAZolam  (XANAX ) 0.25 MG tablet Take 1 tablet (0.25 mg total) by mouth 2 (two) times daily as needed. for anxiety   amLODipine  (NORVASC ) 5 MG tablet Take 1 tablet (5 mg total) by mouth daily.   azithromycin  (ZITHROMAX ) 250 MG tablet Take 2 tablets on day 1, then 1 tablet daily on days 2 through 5   busPIRone  (BUSPAR ) 5 MG tablet Take 1 tablet (5 mg total) by mouth 3 (three) times daily as needed.   cetirizine  (EQ ALLERGY RELIEF, CETIRIZINE ,) 10 MG tablet Take 1 tablet (10 mg total) by mouth daily.   diclofenac  Sodium (VOLTAREN ) 1 % GEL Apply 4 g topically 4 (four) times daily. Follow directions on bottle   EQ ARTHRITIS PAIN RELIEVER 1 % GEL APPLY 2 GRAMS TOPICALLY FOUR TIMES DAILY AS NEEDED   FLUoxetine  (PROZAC ) 20 MG capsule Take 3 capsules (60 mg total) by mouth daily.   fluticasone  (FLONASE ) 50 MCG/ACT nasal spray Place 1 spray into both nostrils daily.   hydrochlorothiazide  (HYDRODIURIL ) 25 MG tablet Take 1 tablet by mouth once daily   methocarbamol  (ROBAXIN ) 500 MG tablet Take 1 tablet (500 mg total) by mouth every 8 (eight) hours as needed for muscle spasms.   Multiple Vitamin (MULTI-VITAMIN) tablet Take 1 tablet by mouth daily.   ondansetron  (ZOFRAN ) 4 MG tablet Take 1 tablet (4 mg total) by mouth every 8 (eight) hours as needed for nausea or vomiting.   ondansetron  (ZOFRAN -ODT) 4 MG disintegrating tablet Take 1 tablet (4 mg total) by mouth every 8 (eight) hours as needed for nausea or vomiting.   pantoprazole  (PROTONIX ) 40 MG tablet Take 1 tablet (40 mg total) by mouth daily.   rosuvastatin  (CRESTOR ) 40 MG tablet Take 1 tablet (40 mg total) by mouth daily.   senna (SENOKOT) 8.6 MG TABS tablet Take 1 tablet by mouth at bedtime.    traMADol  (ULTRAM ) 50 MG  tablet Take 1 tablet (50 mg total) by mouth daily as needed.   tretinoin  (RETIN-A ) 0.05 % cream Apply 1 application topically at bedtime.   valsartan  (DIOVAN ) 320 MG tablet Take 1 tablet (320 mg total) by mouth daily.   Omega-3 Fatty Acids (FISH OIL PO) Take 1 tablet by mouth daily. (Patient not taking: Reported on 05/17/2024)   No facility-administered encounter medications on file as of 05/17/2024.    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   Cancer [facility]) 2004   right lumpectomy, chemotherapy and radiation Dr. Sheryn   CPAP (continuous positive airway pressure) dependence    Depression    GERD (gastroesophageal reflux disease)    Hypertension    Migraine    Personal history of chemotherapy 2004   Personal history of radiation therapy 2004   Personal history of radiation therapy 2019   PONV (postoperative nausea and vomiting)    Shingles    Sleep apnea    uses BiPAP, severe OSA   Vitamin D  deficiency    IN THE PAST  Past Surgical History:  Procedure Laterality Date   ABDOMINAL HYSTERECTOMY     ANTERIOR AND POSTERIOR REPAIR     BREAST BIOPSY Right 2004   stereo. 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  3.5 cm maximum diameter, minimal margins 5 mm.  In situ component less than 10%.   BREAST LUMPECTOMY Right 2004   BREAST LUMPECTOMY Left 09/17/2017   Procedure: BREAST EXCISION, SENTINEL NODE BIOPSY;  Surgeon: Dessa Reyes ORN, MD;  Location: [facility] ORS;  Service: General;  Laterality: Left;   COLONOSCOPY  2015    COLONOSCOPY WITH PROPOFOL  N/A 10/04/2019   Procedure: COLONOSCOPY WITH PROPOFOL ;  Surgeon: Dessa Reyes ORN, MD;  Location: [facility] ENDOSCOPY;  Service: Endoscopy;  Laterality: N/A;   DILATION AND CURETTAGE OF UTERUS     ETHMOIDECTOMY Bilateral 11/05/2016   Procedure: ETHMOIDECTOMY;  Surgeon: Carlie Clark, MD;  Location: [facility] OR;  Service: ENT;  Laterality: Bilateral;   LAPAROTOMY     MAXILLARY ANTROSTOMY Bilateral 11/05/2016   Procedure: MAXILLARY ANTROSTOMY;  Surgeon: Carlie Clark, MD;  Location: [facility] OR;  Service: ENT;  Laterality: Bilateral;   SINUS ENDO W/FUSION Bilateral 11/05/2016   Procedure: FRONTAL RECESS EXPLORATION;  Surgeon: Carlie Clark, MD;  Location: [facility] OR;  Service: ENT;  Laterality: Bilateral;  BILATERAL ENDOSCOPIC SINUS SURGERY WITH FUSION   SINUS EXPLORATION  11/05/2016   SPHENOIDECTOMY Bilateral 11/05/2016   Procedure: SPHENOIDECTOMY;  Surgeon: Carlie Clark, MD;  Location: [facility] OR;  Service: ENT;  Laterality: Bilateral;   Family History  Problem Relation Age of Onset   Heart failure Mother    Heart disease Mother    Hyperlipidemia Mother    Hypertension Mother    Heart murmur Mother    Hypertension Father    Osteoarthritis Father    Stroke Father    Alzheimer's disease Sister    Dementia Sister    Dementia Sister    Alzheimer's disease Brother    Brain cancer Brother    Healthy Daughter    Healthy Son    Breast cancer Neg Hx    Social History   Occupational History   Not on file  Tobacco Use   Smoking status: Never   Smokeless tobacco: Never  Vaping Use   Vaping status: Never Used  Substance and Sexual Activity   Alcohol use: Not Currently    Comment: rarely   Drug use: No   Sexual activity: Not Currently   Tobacco Counseling Counseling given: Not Answered  SDOH Screenings   Food Insecurity: No Food Insecurity (05/17/2024)  Housing: Low Risk (05/17/2024)  Transportation Needs: No Transportation Needs (05/17/2024)  Utilities: Not At Risk (05/17/2024)   Alcohol Screen: Low Risk (05/17/2024)  Depression (PHQ2-9): High Risk (05/17/2024)  Financial Resource Strain: Low Risk (05/17/2024)  Recent Concern: Financial Resource Strain - Medium Risk (05/11/2024)  Physical Activity: Sufficiently Active (05/17/2024)  Recent Concern: Physical Activity - Inactive (05/11/2024)  Social Connections: Moderately Integrated (05/17/2024)  Stress: Stress Concern Present (05/17/2024)  Tobacco Use: Low Risk (05/17/2024)  Health Literacy: Adequate Health Literacy (05/17/2024)   See flowsheets for full screening details  Depression Screen PHQ 2 & 9 Depression Scale- Over the past 2 weeks, how often have you been bothered by any of the following problems? Little interest or pleasure in doing things: 3 Feeling down, depressed, or hopeless (PHQ Adolescent also includes...irritable): 3 PHQ-2 Total  Score: 6 Trouble falling or staying asleep, or sleeping too much: 3 Feeling tired or having little energy: 3 Poor appetite or overeating (PHQ Adolescent also includes...weight loss): 3 Feeling bad about yourself - or that you are a failure or have let yourself or your family down: 2 Trouble concentrating on things, such as reading the newspaper or watching television (PHQ Adolescent also includes...like school work): 0 Moving or speaking so slowly that other people could have noticed. Or the opposite - being so fidgety or restless that you have been moving around a lot more than usual: 1 Thoughts that you would be better off dead, or of hurting yourself in some way: 0 PHQ-9 Total Score: 18 If you checked off any problems, how difficult have these problems made it for you to do your work, take care of things at home, or get along with other people?: Somewhat difficult  Depression Treatment Depression Interventions/Treatment : -- (on medication and it helps)     Goals Addressed             This Visit's Progress    Patient Stated       Wants to exercise              Objective:    Today's Vitals   05/17/24 0931  BP: 128/78  Weight: 216 lb (98 kg)  Height: 5' 4 (1.626 m)   Body mass index is 37.08 kg/m.  Hearing/Vision screen Hearing Screening - Comments:: Wears aids Vision Screening - Comments:: Readers, [facility], overdue, has an appointment January 2026 Immunizations and Health Maintenance Health Maintenance  Topic Date Due   Zoster Vaccines- Shingrix (1 of 2) Never done   COVID-19 Vaccine (3 - Pfizer risk series) 07/29/2019   Mammogram  03/22/2024   Influenza Vaccine  08/29/2024 (Originally 12/31/2023)   Colonoscopy  10/03/2024   Medicare Annual Wellness (AWV)  05/17/2025   DTaP/Tdap/Td (4 - Td or Tdap) 12/16/2032   Pneumococcal Vaccine: 50+ Years  Completed   Bone Density Scan  Completed   Hepatitis C Screening  Completed   Meningococcal B Vaccine  Aged Out        Assessment/Plan:  This is a routine wellness examination for [place].  Patient Care Team: Dineen Rollene MATSU, FNP as PCP - General (Family Medicine) Perla Evalene PARAS, MD as Consulting Physician (Cardiology)  I have personally reviewed and noted the following in the patients chart:   Medical and social history Use of alcohol, tobacco or illicit drugs  Current medications and supplements including opioid prescriptions. Functional ability and status Nutritional status Physical activity Advanced directives List of other physicians Hospitalizations, surgeries, and ER visits in previous 12 months Vitals Screenings to include cognitive, depression, and falls Referrals and appointments  No orders of the defined types were placed in this encounter.  In addition, I have reviewed and discussed with patient certain preventive protocols, quality metrics, and best practice recommendations. A written personalized care plan for preventive services as well as general preventive health recommendations were provided to patient.   Angeline Fredericks, LPN   [id number]    Return in 1 year (on 05/17/2025).  After Visit Summary: (MyChart) Due to this being a telephonic visit, the after visit summary with patients personalized plan was offered to patient via MyChart   Nurse Notes: Patient declines flu, shingles and covid vaccines. Patient stated that she will be calling today to schedule her mammogram. See depression screening which was also done 05/15/24 at office visit.

## 2024-05-18 ENCOUNTER — Telehealth: Payer: Self-pay | Admitting: Family

## 2024-05-18 ENCOUNTER — Encounter: Payer: Self-pay | Admitting: Family

## 2024-05-18 DIAGNOSIS — R911 Solitary pulmonary nodule: Secondary | ICD-10-CM | POA: Insufficient documentation

## 2024-05-18 NOTE — Assessment & Plan Note (Signed)
 Chronic, poorly controlled. Stop Imitrex  due to cardiovascular risks.  Zofran  is prescribed for nausea, magnesium  supplementation is recommended, and Tylenol  is advised for headache management.  Advised to follow-up with pulmonology for BiPAP management . Will follow.

## 2024-05-18 NOTE — Assessment & Plan Note (Addendum)
 Blood pressure well-controlled.  Continue Compliant with valsartan  320 mg daily, amlodipine  5 mg, hydrochlorothiazide  25mg  every day Overdue for follow-up with cardiology, she will call to schedule

## 2024-05-18 NOTE — Telephone Encounter (Signed)
 Called [facility] Spoke to Mrs Dana Obrien she will have xrays pushed through

## 2024-05-18 NOTE — Assessment & Plan Note (Addendum)
 Chronic, suboptimal control. Anxiety and depression are managed with Xanax  and Prozac . Shared concerns about Xanax 's habit-forming potential and effects on memory . Trial of Buspar .  Counseled on minimizing use of Xanax  and monitoring for sedation.  Continue Prozac  60mg  every day, xanax  0.25 mg twice daily as needed.

## 2024-05-18 NOTE — Assessment & Plan Note (Addendum)
 Ct chest 03/2023 Pulmonary nodule   Bilateral pulmonary nodules are noted, the largest measuring 4 mm   With a history of breast cancer, there is a discussion about potential follow-up imaging to rule out malignancy. Pulmonary nodule follow-up is discussed with a pulmonologist. Patient declines my ordering CT chest. She would like to discuss with  pulmonology

## 2024-05-18 NOTE — Assessment & Plan Note (Signed)
 She experiences intermittent right arm numbness, with a differential diagnosis of cervical radiculopathy versus carpal tunnel syndrome. A cervical spine x-ray and B12 lab are ordered to rule out deficiency.

## 2024-05-18 NOTE — Assessment & Plan Note (Addendum)
°  Chronic back pain with radiculopathy.  Advised to stop celebrex  due to cardiovascular risk. Advised to increased Tylenol  1000 mg three times daily.  She may continue Voltaren  gel and advised she must follow OTC instructions on container and measure.   refill tramadol  50 mg to take daily as needed.  Daughter and concerns about the sedation risk from tramadol  and Xanax  and to separate use of these medications by several hours.  She understands not to take these medications close together. Counseled on sedation.  Consider gabapentin  at follow up.

## 2024-05-18 NOTE — Assessment & Plan Note (Addendum)
 No acute respiratory distress.  Reported history of asthma.  persistent productive cough, greenish-yellow sputum, worsening fatigue, and episodic shortness of breath. A chest x-ray is ordered to rule out pneumonia, and azithromycin  (Z-Pak) is prescribed. She completed prednisone .

## 2024-05-18 NOTE — Telephone Encounter (Signed)
 What is status of chest x-ray 05/15/24?  Call [facility]

## 2024-05-18 NOTE — Telephone Encounter (Signed)
 Pt has scheduled f/up with you on 06/20/24 @ 11 am

## 2024-05-29 ENCOUNTER — Ambulatory Visit
Admission: RE | Admit: 2024-05-29 | Discharge: 2024-05-29 | Disposition: A | Discharge status: Home / Self Care | Source: Ambulatory Visit | Attending: Family | Admitting: Family

## 2024-05-29 ENCOUNTER — Ambulatory Visit: Payer: Self-pay | Admitting: Internal Medicine

## 2024-05-29 ENCOUNTER — Other Ambulatory Visit: Payer: Self-pay | Admitting: Family

## 2024-05-29 DIAGNOSIS — R200 Anesthesia of skin: Secondary | ICD-10-CM | POA: Insufficient documentation

## 2024-05-29 NOTE — Progress Notes (Signed)
 No ckd

## 2024-05-29 NOTE — Telephone Encounter (Signed)
 1st attempt - message states this person is not available and disconnected.   Copied from CRM #[id number]. Topic: Clinical - Red Word Triage >> May 29, 2024  9:57 AM Dana Obrien wrote: Red Word that prompted transfer to Nurse Triage: SOB for a few weeks, URI as well, SOB not better. States having congestion in chest and head still. States also has asthma.  Held 10 minutes, requesting call back.

## 2024-05-29 NOTE — Telephone Encounter (Signed)
 FYI Only or Action Required?: Action required by provider: request for appointment.  Patient is followed in Pulmonology for Bipap, last seen on 01/14/2023 by Isaiah Scrivener, MD.  Called Nurse Triage reporting Shortness of Breath.  Symptoms began about a month ago.  Interventions attempted: Prescription medications: z-pack, prednisone .  Symptoms are: unchanged.  Triage Disposition: See PCP When Office is Open (Within 3 Days)  Patient/caregiver understands and will follow disposition?:   E2C2 Pulmonary Triage - Initial Assessment Questions Chief Complaint (e.g., cough, sob, wheezing, fever, chills, sweat or additional symptoms) *Go to specific symptom protocol after initial questions. SOB w/exertion  How long have symptoms been present? 1 month  Have you tested for COVID or Flu? Note: If not, ask patient if a home test can be taken. If so, instruct patient to call back for positive results. Yes on 05/06/24  MEDICINES:   Have you used any OTC meds to help with symptoms? No If yes, ask What medications? None  Have you used your inhalers/maintenance medication? Yes If yes, What medications? Albuterol   If inhaler, ask How many puffs and how often? Note: Review instructions on medication in the chart. 2 puffs a couple times a day  OXYGEN : Do you wear supplemental oxygen ? No If yes, How many liters are you supposed to use? No  Do you monitor your oxygen  levels? No If yes, What is your reading (oxygen  level) today? N/A  What is your usual oxygen  saturation reading?  (Note: Pulmonary O2 sats should be 90% or greater) NA       Reason for Disposition  [1] MODERATE longstanding difficulty breathing (e.g., speaks in phrases, SOB even at rest, pulse 100-120) AND [2] SAME as normal  Answer Assessment - Initial Assessment Questions Patient says she's been having an URI, treated with prednisone  on 05/06/24 and z-pack given by primary on 05/15/24. She says she  doesn't feel like she's taken anything. Denies fever, CP, wheezing. She has a cough w/thick, white mucus. Advised no availability with Dr. Isaiah to granddaughter Duwaine until February and no availability with any provider until late January, so I will be sending this encounter to the provider and someone will be calling with a sooner appointment. She verbalized understanding.   1. RESPIRATORY STATUS: Describe your breathing? (e.g., wheezing, shortness of breath, unable to speak, severe coughing)      SOB w/exertion  2. ONSET: When did this breathing problem begin?      1 month ago  3. PATTERN Does the difficult breathing come and go, or has it been constant since it started?      Comes and goes with exertion  4. SEVERITY: How bad is your breathing? (e.g., mild, moderate, severe)      Moderate  5. RECURRENT SYMPTOM: Have you had difficulty breathing before? If Yes, ask: When was the last time? and What happened that time?      Yes  6. CARDIAC HISTORY: Do you have any history of heart disease? (e.g., heart attack, angina, bypass surgery, angioplasty)      Yes  7. LUNG HISTORY: Do you have any history of lung disease?  (e.g., pulmonary embolus, asthma, emphysema)     Asthma  8. CAUSE: What do you think is causing the breathing problem?      URI  9. OTHER SYMPTOMS: Do you have any other symptoms? (e.g., chest pain, cough, dizziness, fever, runny nose)     Cough, runny nose, dizzy at times, stuffy nose  10. O2 SATURATION MONITOR:  Do  you use an oxygen  saturation monitor (pulse oximeter) at home? If Yes, ask: What is your reading (oxygen  level) today? What is your usual oxygen  saturation reading? (e.g., 95%)       No  Protocols used: Breathing Difficulty-A-AH

## 2024-05-29 NOTE — Telephone Encounter (Signed)
 PAS called to transfer the call which dropped before transfer was completed. Message left for patient to call back to Nurse Triage

## 2024-05-30 ENCOUNTER — Ambulatory Visit

## 2024-05-30 ENCOUNTER — Ambulatory Visit: Payer: Self-pay | Admitting: Family

## 2024-05-30 NOTE — Telephone Encounter (Signed)
 NA. LMTCB. Patient can see Dana Obrien today at 10:30am.

## 2024-05-30 NOTE — Telephone Encounter (Signed)
 DONE see result notes

## 2024-05-30 NOTE — Telephone Encounter (Signed)
 Patient scheduled with Dr. Isaiah for 06/20/24. Advised to go to urgent care or ED for shortness of breath. NFN.

## 2024-05-30 NOTE — Telephone Encounter (Signed)
 Spoke to pt daughter she is worried about the MRI Mom had not the sob because its not where she is not able to catch her breath she states that this is nothing new will take her to UC in [facility]

## 2024-05-31 ENCOUNTER — Other Ambulatory Visit: Payer: Self-pay | Admitting: Family

## 2024-05-31 DIAGNOSIS — R200 Anesthesia of skin: Secondary | ICD-10-CM

## 2024-06-02 ENCOUNTER — Encounter

## 2024-06-04 ENCOUNTER — Ambulatory Visit
Admission: EM | Admit: 2024-06-04 | Discharge: 2024-06-04 | Disposition: A | Discharge status: Home / Self Care | Attending: Emergency Medicine | Admitting: Emergency Medicine

## 2024-06-04 ENCOUNTER — Encounter: Payer: Self-pay | Admitting: Emergency Medicine

## 2024-06-04 DIAGNOSIS — R0981 Nasal congestion: Secondary | ICD-10-CM

## 2024-06-04 DIAGNOSIS — J22 Unspecified acute lower respiratory infection: Secondary | ICD-10-CM | POA: Diagnosis not present

## 2024-06-04 DIAGNOSIS — R051 Acute cough: Secondary | ICD-10-CM | POA: Diagnosis present

## 2024-06-04 MED ORDER — AMOXICILLIN-POT CLAVULANATE 875-125 MG PO TABS: 1.0000 | ORAL_TABLET | Freq: Two times a day (BID) | ORAL | 0 refills | Status: DC

## 2024-06-04 NOTE — ED Provider Notes (Signed)
 " [facility]    CSN: [id number] Arrival date & time: 06/04/24  1204      History   Chief Complaint Chief Complaint  Patient presents with   Cough    HPI YANINA KNUPP is a 79 y.o. female.   79 year old female, Naamah Boggess, presents to urgent care for evaluation of cough and chest congestion.  Patient states she was seen on 05/04/2024 for same and was scripted albuterol  and prednisone , patient has followed up with her PCP since then and completed a Z-Pak denies improvement.  Patient denies any fevers or new illness exposure.   Pmh: Severe OSA,asthma, anxiety, GERD, Migraine  The history is provided by the patient. No language interpreter was used.  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   Cancer [facility]) 2004   right lumpectomy, chemotherapy and radiation Dr. Sheryn   CPAP (continuous positive airway pressure) dependence    Depression    GERD (gastroesophageal reflux disease)    Hypertension    Migraine    Personal history of chemotherapy 2004   Personal history of radiation therapy 2004   Personal history of radiation therapy 2019   PONV (postoperative nausea and vomiting)    Shingles    Sleep apnea    uses BiPAP, severe OSA   Vitamin D  deficiency    IN THE PAST    Patient Active Problem List   Diagnosis Date Noted   Acute respiratory infection 06/04/2024   Nasal congestion 06/04/2024   Pulmonary nodule 05/18/2024   Right arm numbness 05/15/2024   Bronchitis 05/15/2024   Mood disorder 09/05/2023   Migraine 09/05/2023   Hypokalemia 01/03/2023   Positive D dimer  01/03/2023   Palpitations 11/04/2022   MDD (major depressive disorder), recurrent episode, moderate (HCC) 09/09/2022   Major depressive disorder, single episode, severe without psychosis (HCC) 09/09/2022   Hydronephrosis 07/21/2022   Left lower quadrant pain 04/20/2022   Urge incontinence 07/11/2021   Right knee pain 11/08/2020   Vitamin D  deficiency 08/08/2020   Gastroesophageal reflux disease without esophagitis 03/25/2020   Dermatitis due to plants, including poison ivy, sumac, and oak 02/29/2020   Neuropathy 12/15/2019   Allergic rhinitis 07/05/2019   Brain atrophy 05/18/2019   Bilateral hearing loss 08/15/2018   Decreased energy 08/15/2018   Prediabetes 06/02/2018   Arthralgia 06/02/2018   BPPV (benign paroxysmal positional vertigo), right 03/11/2018   Dysfunction of right eustachian tube 03/11/2018   Malignant neoplasm of upper-outer quadrant of left breast in female, 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/14/2017   Fatty liver 08/24/2017   Hearing difficulty of both ears 06/25/2017   Bilateral sensorineural hearing loss 10/13/2016   Right ear impacted cerumen 10/13/2016   GERD (gastroesophageal reflux disease) 09/07/2016   Acute cough 07/08/2016   Dyspnea on exertion 01/15/2016   Uterine prolapse 11/14/2015   OSA (obstructive sleep apnea) 11/01/2015   Back muscle spasm 04/30/2015   DDD (degenerative disc disease), lumbar 04/30/2015   Lumbar radiculitis 04/30/2015   Low back pain 01/01/2015   Varicose vein of leg  11/22/2014   Increased frequency of urination 11/17/2013   Rectocele 11/17/2013   Cystocele 10/25/2013   Disorder of skin and subcutaneous tissue 10/25/2013   Midline cystocele 10/25/2013   Obesity, unspecified 10/19/2012   Dysuria 06/30/2012   History of breast cancer 02/11/2012   Hyperlipidemia 05/11/2011   Osteopenia 05/11/2011   Disorder of bone and cartilage 05/11/2011   Anxiety and depression 03/04/2011   Hypertension 03/04/2011    Past Surgical  History:  Procedure Laterality Date   ABDOMINAL HYSTERECTOMY     ANTERIOR AND POSTERIOR REPAIR     BREAST BIOPSY Right 2004   stereo. 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  3.5 cm maximum diameter, minimal margins 5 mm.  In situ component less than 10%.   BREAST LUMPECTOMY Right 2004   BREAST LUMPECTOMY Left 09/17/2017   Procedure: BREAST EXCISION, SENTINEL NODE BIOPSY;  Surgeon: Dessa Reyes ORN, MD;  Location: [facility] ORS;  Service: General;  Laterality: Left;   COLONOSCOPY  2015   COLONOSCOPY WITH PROPOFOL  N/A 10/04/2019   Procedure: COLONOSCOPY WITH PROPOFOL ;  Surgeon: Dessa Reyes ORN, MD;  Location: [facility] ENDOSCOPY;  Service: Endoscopy;  Laterality: N/A;   DILATION AND CURETTAGE OF UTERUS     ETHMOIDECTOMY Bilateral 11/05/2016   Procedure: ETHMOIDECTOMY;  Surgeon: Carlie Clark, MD;  Location: [facility] OR;  Service: ENT;  Laterality: Bilateral;   LAPAROTOMY     MAXILLARY ANTROSTOMY Bilateral 11/05/2016   Procedure: MAXILLARY ANTROSTOMY;  Surgeon: Carlie Clark, MD;  Location: [facility] OR;  Service: ENT;  Laterality: Bilateral;   SINUS ENDO W/FUSION Bilateral 11/05/2016   Procedure: FRONTAL RECESS EXPLORATION;  Surgeon: Carlie Clark, MD;  Location: [facility] OR;  Service: ENT;  Laterality: Bilateral;  BILATERAL ENDOSCOPIC SINUS SURGERY WITH FUSION   SINUS EXPLORATION  11/05/2016   SPHENOIDECTOMY Bilateral 11/05/2016   Procedure: SPHENOIDECTOMY;  Surgeon: Carlie Clark, MD;  Location: [facility] OR;  Service: ENT;  Laterality: Bilateral;    OB History     Gravida  2   Para      Term      Preterm      AB      Living         SAB      IAB      Ectopic      Multiple      Live Births  2        Obstetric Comments  1st Menstrual Cycle:  15 1st Pregnancy: 31            Home Medications    Prior to  Admission medications  Medication Sig Start Date End Date Taking? Authorizing Provider  amoxicillin -clavulanate (AUGMENTIN ) 875-125 MG tablet Take 1 tablet by mouth every 12 (twelve) hours. 06/04/24  Yes Silus Lanzo, Rilla, NP  albuterol  (VENTOLIN  HFA) 108 (90 Base) MCG/ACT inhaler Inhale 2 puffs into the lungs every 6 (six) hours as needed for wheezing or shortness of breath. 12/26/22   Robinette Vermell PARAS, MD  ALPRAZolam  (XANAX ) 0.25 MG tablet Take 1 tablet (0.25 mg total) by mouth 2 (two) times daily as needed. for anxiety 05/15/24   Dineen Rollene MATSU, FNP  amLODipine  (NORVASC ) 5 MG tablet Take 1 tablet (5 mg total) by mouth daily. 08/31/23   Hope Merle, MD  busPIRone  (BUSPAR ) 5 MG tablet Take 1 tablet (5 mg total) by mouth 3 (three)  times daily as needed. 05/15/24   Dineen Rollene MATSU, FNP  cetirizine  (EQ ALLERGY RELIEF, CETIRIZINE ,) 10 MG tablet Take 1 tablet (10 mg total) by mouth daily. 08/31/23   Hope Merle, MD  diclofenac  Sodium (VOLTAREN ) 1 % GEL Apply 4 g topically 4 (four) times daily. Follow directions on bottle 05/15/24   Dineen Rollene MATSU, FNP  EQ ARTHRITIS PAIN RELIEVER 1 % GEL APPLY 2 GRAMS TOPICALLY FOUR TIMES DAILY AS NEEDED 04/17/22   Maribeth Camellia MATSU, MD  FLUoxetine  (PROZAC ) 20 MG capsule Take 3 capsules (60 mg total) by mouth daily. 08/31/23   Hope Merle, MD  fluticasone  (FLONASE ) 50 MCG/ACT nasal spray Place 1 spray into both nostrils daily. 02/17/24   Marylynn Verneita CROME, MD  hydrochlorothiazide  (HYDRODIURIL ) 25 MG tablet Take 1 tablet by mouth once daily 02/24/24   Gollan, Timothy J, MD  methocarbamol  (ROBAXIN ) 500 MG tablet Take 1 tablet (500 mg total) by mouth every 8 (eight) hours as needed for muscle spasms. 08/27/23   Persons, Ronal Dragon, PA  Multiple Vitamin (MULTI-VITAMIN) tablet Take 1 tablet by mouth daily.    [provider]  Omega-3 Fatty Acids (FISH OIL PO) Take 1 tablet by mouth daily. Patient not taking: Reported on 05/17/2024    [provider]   ondansetron  (ZOFRAN ) 4 MG tablet Take 1 tablet (4 mg total) by mouth every 8 (eight) hours as needed for nausea or vomiting. 03/17/24   Kaur, Charanpreet, NP  ondansetron  (ZOFRAN -ODT) 4 MG disintegrating tablet Take 1 tablet (4 mg total) by mouth every 8 (eight) hours as needed for nausea or vomiting. 05/15/24   Dineen Rollene MATSU, FNP  pantoprazole  (PROTONIX ) 40 MG tablet Take 1 tablet (40 mg total) by mouth daily. 08/31/23   Hope Merle, MD  rosuvastatin  (CRESTOR ) 40 MG tablet Take 1 tablet (40 mg total) by mouth daily. 08/31/23   Walsh, Tanya, MD  senna (SENOKOT) 8.6 MG TABS tablet Take 1 tablet by mouth at bedtime.     [provider]  traMADol  (ULTRAM ) 50 MG tablet Take 1 tablet (50 mg total) by mouth daily as needed. 05/15/24   Dineen Rollene MATSU, FNP  tretinoin  (RETIN-A ) 0.05 % cream Apply 1 application topically at bedtime. 05/31/18   Maribeth Camellia MATSU, MD  valsartan  (DIOVAN ) 320 MG tablet Take 1 tablet (320 mg total) by mouth daily. 08/31/23   Hope Merle, MD    Family History Family History  Problem Relation Age of Onset   Heart failure Mother    Heart disease Mother    Hyperlipidemia Mother    Hypertension Mother    Heart murmur Mother    Hypertension Father    Osteoarthritis Father    Stroke Father    Alzheimer's disease Sister    Dementia Sister    Dementia Sister    Alzheimer's disease Brother    Brain cancer Brother    Healthy Daughter    Healthy Son    Breast cancer Neg Hx     Social History Social History[1]   Allergies   Lidocaine , Codeine, Morphine , Niacin, and Niacin and related   Review of Systems Review of Systems  Constitutional:  Negative for fever.  HENT:  Positive for congestion, postnasal drip and sinus pressure.   Respiratory:  Positive for cough and shortness of breath. Negative for chest tightness and wheezing.   Cardiovascular:  Negative for chest pain and palpitations.  All other systems reviewed and are negative.    Physical  Exam Triage Vital Signs ED  Triage Vitals  Encounter Vitals Group     BP 06/04/24 1354 (!) 171/91     Girls Systolic BP Percentile --      Girls Diastolic BP Percentile --      Boys Systolic BP Percentile --      Boys Diastolic BP Percentile --      Pulse Rate 06/04/24 1354 80     Resp 06/04/24 1354 16     Temp 06/04/24 1354 98.7 F (37.1 C)     Temp Source 06/04/24 1354 Oral     SpO2 06/04/24 1354 95 %     Weight 06/04/24 1353 216 lb 0.8 oz (98 kg)     Height 06/04/24 1353 5' 4 (1.626 m)     Head Circumference --      Peak Flow --      Pain Score 06/04/24 1352 2     Pain Loc --      Pain Education --      Exclude from Growth Chart --    No data found.  Updated Vital Signs BP (!) 171/91 (BP Location: Right Arm)   Pulse 80   Temp 98.7 F (37.1 C) (Oral)   Resp 16   Ht 5' 4 (1.626 m)   Wt 216 lb 0.8 oz (98 kg)   SpO2 95%   BMI 37.09 kg/m   Visual Acuity Right Eye Distance:   Left Eye Distance:   Bilateral Distance:    Right Eye Near:   Left Eye Near:    Bilateral Near:     Physical Exam Vitals and nursing note reviewed.  Constitutional:      General: She is not in acute distress.    Appearance: She is well-developed and well-groomed.  HENT:     Head: Normocephalic.     Right Ear: Tympanic membrane is retracted.     Left Ear: Tympanic membrane is retracted.     Nose: Mucosal edema and congestion present.     Right Sinus: Maxillary sinus tenderness present.     Mouth/Throat:     Lips: Pink.     Mouth: Mucous membranes are moist.     Pharynx: Uvula midline. Postnasal drip present.     Comments: Thick yellow PND noted Eyes:     General: Lids are normal.     Conjunctiva/sclera: Conjunctivae normal.     Pupils: Pupils are equal, round, and reactive to light.  Neck:     Trachea: No tracheal deviation.  Cardiovascular:     Rate and Rhythm: Normal rate and regular rhythm.     Heart sounds: Normal heart sounds. No murmur heard. Pulmonary:     Effort:  Pulmonary effort is normal.     Breath sounds: Normal breath sounds and air entry.  Abdominal:     General: Bowel sounds are normal.     Palpations: Abdomen is soft.     Tenderness: There is no abdominal tenderness.  Musculoskeletal:        General: Normal range of motion.     Cervical back: Normal range of motion.  Lymphadenopathy:     Cervical: No cervical adenopathy.  Skin:    General: Skin is warm and dry.     Findings: No rash.  Neurological:     General: No focal deficit present.     Mental Status: She is alert and oriented to person, place, and time.     GCS: GCS eye subscore is 4. GCS verbal subscore is 5. GCS motor subscore  is 6.  Psychiatric:        Attention and Perception: Attention normal.        Mood and Affect: Mood normal.        Speech: Speech normal.        Behavior: Behavior normal. Behavior is cooperative.      UC Treatments / Results  Labs (all labs ordered are listed, but only abnormal results are displayed) Labs Reviewed - No data to display  EKG   Radiology No results found.  Procedures Procedures (including critical care time)  Medications Ordered in UC Medications - No data to display  Initial Impression / Assessment and Plan / UC Course  I have reviewed the triage vital signs and the nursing notes.  Pertinent labs & imaging results that were available during my care of the patient were reviewed by me and considered in my medical decision making (see chart for details).    Discussed exam findings and plan of care with patient : take augmentin  for acute respiratory infection (upper versus lower )as prescribed Push fluids,avoid caffeine Follow up with PCP If you have chest pain, palpitations, worsening shortness of breath or new symptoms go to the emergency room for further evaluation.  Patient verbalized understanding to this provider.   Ddx: Acute respiratory infection(upper vs lower), acute cough, nasal congestion, allergies Final  Clinical Impressions(s) / UC Diagnoses   Final diagnoses:  Acute respiratory infection  Acute cough  Nasal congestion     Discharge Instructions      Take augmentin  as prescribed Push fluids,avoid caffeine Follow up with PCP If you have chest pain, palpitations, worsening shortness of breath or new symptoms go to the emergency room for further evaluation.    ED Prescriptions     Medication Sig Dispense Auth. Provider   amoxicillin -clavulanate (AUGMENTIN ) 875-125 MG tablet Take 1 tablet by mouth every 12 (twelve) hours. 14 tablet Bastian Andreoli, Rilla, NP      PDMP not reviewed this encounter.     [1]  Social History Tobacco Use   Smoking status: Never   Smokeless tobacco: Never  Vaping Use   Vaping status: Never Used  Substance Use Topics   Alcohol use: Not Currently    Comment: rarely   Drug use: No     Preston Garabedian, Rilla, NP 06/04/24 1423  "

## 2024-06-04 NOTE — Discharge Instructions (Addendum)
 Take augmentin  as prescribed Push fluids,avoid caffeine Follow up with PCP If you have chest pain, palpitations, worsening shortness of breath or new symptoms go to the emergency room for further evaluation.

## 2024-06-04 NOTE — ED Triage Notes (Signed)
 Patient was seen on 05/04/24 for cough and chest congestion.  Patient reports still having the cough and chest congestion that has not gotten better.  Patient did finish her Prednisone  and Z-Pack.  Patient denies recent fevers.

## 2024-06-05 ENCOUNTER — Other Ambulatory Visit: Payer: Self-pay | Admitting: Cardiovascular Disease

## 2024-06-05 NOTE — Telephone Encounter (Signed)
 Please call our office to schedule an overdue appointment with Dr. Gollan before anymore refills. [phone]. Thank you 2nd attempt

## 2024-06-14 ENCOUNTER — Encounter

## 2024-06-19 ENCOUNTER — Other Ambulatory Visit: Payer: Self-pay | Admitting: Cardiovascular Disease

## 2024-06-19 ENCOUNTER — Ambulatory Visit: Admitting: Surgical

## 2024-06-20 ENCOUNTER — Ambulatory Visit: Admitting: Internal Medicine

## 2024-06-20 ENCOUNTER — Encounter: Payer: Self-pay | Admitting: Internal Medicine

## 2024-06-20 ENCOUNTER — Encounter: Payer: Self-pay | Admitting: Family

## 2024-06-20 ENCOUNTER — Other Ambulatory Visit: Payer: Self-pay | Admitting: Family

## 2024-06-20 ENCOUNTER — Ambulatory Visit (INDEPENDENT_AMBULATORY_CARE_PROVIDER_SITE_OTHER): Admitting: Family

## 2024-06-20 VITALS — BP 128/70 | HR 83 | Temp 97.9°F | Ht 64.0 in | Wt 211.0 lb

## 2024-06-20 DIAGNOSIS — J209 Acute bronchitis, unspecified: Secondary | ICD-10-CM

## 2024-06-20 DIAGNOSIS — Z1231 Encounter for screening mammogram for malignant neoplasm of breast: Secondary | ICD-10-CM | POA: Diagnosis not present

## 2024-06-20 DIAGNOSIS — J40 Bronchitis, not specified as acute or chronic: Secondary | ICD-10-CM

## 2024-06-20 DIAGNOSIS — Z1211 Encounter for screening for malignant neoplasm of colon: Secondary | ICD-10-CM

## 2024-06-20 DIAGNOSIS — E559 Vitamin D deficiency, unspecified: Secondary | ICD-10-CM | POA: Diagnosis not present

## 2024-06-20 DIAGNOSIS — R200 Anesthesia of skin: Secondary | ICD-10-CM

## 2024-06-20 DIAGNOSIS — R053 Chronic cough: Secondary | ICD-10-CM

## 2024-06-20 DIAGNOSIS — F32A Depression, unspecified: Secondary | ICD-10-CM

## 2024-06-20 DIAGNOSIS — R918 Other nonspecific abnormal finding of lung field: Secondary | ICD-10-CM

## 2024-06-20 DIAGNOSIS — G4733 Obstructive sleep apnea (adult) (pediatric): Secondary | ICD-10-CM | POA: Diagnosis not present

## 2024-06-20 DIAGNOSIS — M51369 Other intervertebral disc degeneration, lumbar region without mention of lumbar back pain or lower extremity pain: Secondary | ICD-10-CM

## 2024-06-20 DIAGNOSIS — Z780 Asymptomatic menopausal state: Secondary | ICD-10-CM

## 2024-06-20 DIAGNOSIS — F419 Anxiety disorder, unspecified: Secondary | ICD-10-CM | POA: Diagnosis not present

## 2024-06-20 MED ORDER — IPRATROPIUM-ALBUTEROL 0.5-2.5 (3) MG/3ML IN SOLN: 3.0000 mL | RESPIRATORY_TRACT | 5 refills | Status: AC | PRN

## 2024-06-20 MED ORDER — FLUTICASONE-SALMETEROL 230-21 MCG/ACT IN AERO: 2.0000 | INHALATION_SPRAY | Freq: Two times a day (BID) | RESPIRATORY_TRACT | 12 refills | Status: AC

## 2024-06-20 NOTE — Progress Notes (Unsigned)
 "  Assessment & Plan:  There are no diagnoses linked to this encounter.   Return precautions given.   Risks, benefits, and alternatives of the medications and treatment plan prescribed today were discussed, and patient expressed understanding.   Education regarding symptom management and diagnosis given to patient on AVS either electronically or printed.  No follow-ups on file.  Rollene Northern, FNP  Subjective:    Patient ID: Dana Obrien, female    DOB: [birth year], 79 y.o.   MRN: [id number]  CC: Dana Obrien is a 79 y.o. female who presents today for follow up.   HPI: Accompanied by daughter    Discussed the use of AI scribe software for clinical note transcription with the patient, who gave verbal consent to proceed.  History of Present Illness   Dana Obrien Cutrone is a 79 year old female who presents with fatigue and sleep disturbances.  She experiences significant fatigue and difficulty falling asleep at night. She often takes Xanax  to aid in falling asleep and naps during the day, sometimes for up to two hours. No regular exercise is limited by mobility.  She is using a cane  She has been experiencing episodic shortness of breath and a persistent cough for about six weeks following an upper respiratory infection. The cough occurs about twice a day and twice at night, producing thick congestion. Cough has improved. Denies fever, CP.    There is a decrease in her energy levels and physical activity over the past year, with a more noticeable decline in the last six months. She manages to do some household chores but finds it increasingly difficult due to fatigue and shortness of breath. She has a history of chronic low back and neck pain, which she describes as constant and affecting her mobility.  She experiences numbness and tingling in her right arm.  She follows with Dr. Georgina, orthopedic and has shared her MRI cervical spine result with orthopedics.   They have offered an injection of her neck.  She is planning to schedule    She also takes extra strength Tylenol  for pain management, usually 1000 mg once or twice a day.   She also takes a multivitamin and has been trying to eat healthier, resulting in some weight loss.      Compliant with BuSpar  5 mg 3 times daily as needed.  She may take Xanax  0.25 mg nightly prn   Appointment today with Dr. 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 A cervical MRI was reviewed. A neurosurgery referral was discussed for a potential steroid injection as a first-line treatment before considering surgery. Encourage tramadol  for pain management, adjusting timing to avoid sedation. Schedule a steroid injection with neurosurgery. Consider physical therapy for deconditioning and balance after pain control.  Post-viral cough and fatigue   She has a persistent cough and fatigue following an upper respiratory infection, with a productive cough and thick sputum. No pneumonia was found on the chest x-ray. This is likely a post-viral syndrome with lingering inflammation. A pulmonology follow-up is scheduled. Discussed the potential use of a Symbicort  inhaler with prednisone  if recommended by pulmonology. Continue follow-up with pulmonology and consider the inhaler with prednisone   if advised.  Insomnia, difficulty initiating sleep   She has difficulty initiating sleep and frequently takes daytime naps. Behavioral sleep hygiene strategies were discussed, with a goal to reduce Xanax  use over time. Limit naps to 30 minutes, avoid screens before bed, and use the bed only for sleep. Consider using the [facility] app  for audiobooks before bed. Gradually reduce Xanax  use.  Vitamin D  deficiency   Her vitamin D  levels were previously low, and she is currently on supplements. Plan to reassess levels in four weeks. Ordered a vitamin D  level test in four weeks.  Deconditioning   Her activity level has decreased over six months, contributing to fatigue. Chronic pain and shortness of breath may also contribute. Encourage increased physical activity as tolerated. Discussed the potential benefits of physical therapy for conditioning and balance. Consider physical therapy for conditioning and balance.  Encounter for screening mammogram for malignant neoplasm of breast   A mammogram is due for routine screening, with the previous mammogram up to date. Ordered a mammogram.        Allergies: Lidocaine , Codeine, Morphine , Niacin, and Niacin and related Medications Ordered Prior to Encounter[1]  Review of Systems  Constitutional:  Positive for fatigue. Negative for chills and fever.  Respiratory:  Positive for shortness of breath. Negative for cough and wheezing.   Cardiovascular:  Negative for chest pain, palpitations and leg swelling.  Gastrointestinal:  Negative for nausea and vomiting.  Musculoskeletal:  Positive for back pain.      Objective:    BP 128/70   Pulse 83   Temp 97.9 F (36.6 C) (Oral)   Ht 5' 4 (1.626 m)   Wt 211 lb (95.7 kg)   SpO2 95%   BMI 36.22 kg/m  BP Readings from Last 3 Encounters:  06/20/24 128/70  06/04/24 (!) 171/91  05/17/24 128/78   Wt Readings from Last 3 Encounters:  06/20/24 211 lb (95.7 kg)  06/04/24 216 lb 0.8 oz (98 kg)  05/17/24 216 lb (98 kg)    Physical Exam Vitals reviewed.  Constitutional:      Appearance: She is well-developed.  Eyes:     Conjunctiva/sclera: Conjunctivae normal.  Cardiovascular:     Rate and Rhythm: Normal rate and regular rhythm.     Pulses: Normal pulses.     Heart sounds: Normal heart sounds.  Pulmonary:     Effort: Pulmonary  effort is normal.     Breath sounds: Normal breath sounds. No wheezing, rhonchi or rales.  Musculoskeletal:     Right lower leg: No edema.     Left lower leg: No edema.  Skin:    General: Skin is warm and dry.  Neurological:     Mental Status: She is alert.  Psychiatric:        Speech: Speech normal.        Behavior: Behavior normal.        Thought Content: Thought content normal.           [1]  Current Outpatient Medications on File Prior to Visit  Medication Sig Dispense Refill   albuterol  (VENTOLIN  HFA) 108 (90 Base) MCG/ACT inhaler Inhale 2 puffs into the lungs every 6 (six) hours as needed for wheezing or shortness of breath. 8 g 2   ALPRAZolam  (XANAX ) 0.25 MG tablet Take 1 tablet (0.25 mg total) by mouth 2 (two) times daily as needed. for anxiety 30 tablet 1   amLODipine  (NORVASC ) 5 MG tablet Take 1 tablet (5 mg total) by mouth daily.  90 tablet 3   amoxicillin -clavulanate (AUGMENTIN ) 875-125 MG tablet Take 1 tablet by mouth every 12 (twelve) hours. 14 tablet 0   busPIRone  (BUSPAR ) 5 MG tablet Take 1 tablet (5 mg total) by mouth 3 (three) times daily as needed. 60 tablet 1   cetirizine  (EQ ALLERGY RELIEF, CETIRIZINE ,) 10 MG tablet Take 1 tablet (10 mg total) by mouth daily. 90 tablet 3   FLUoxetine  (PROZAC ) 20 MG capsule Take 3 capsules (60 mg total) by mouth daily. 270 capsule 3   fluticasone  (FLONASE ) 50 MCG/ACT nasal spray Place 1 spray into both nostrils daily. 16 g 0   hydrochlorothiazide  (HYDRODIURIL ) 25 MG tablet TAKE 1 TABLET BY MOUTH ONCE DAILY . APPOINTMENT REQUIRED FOR FUTURE REFILLS 25 tablet 0   methocarbamol  (ROBAXIN ) 500 MG tablet Take 1 tablet (500 mg total) by mouth every 8 (eight) hours as needed for muscle spasms. 30 tablet 0   Multiple Vitamin (MULTI-VITAMIN) tablet Take 1 tablet by mouth daily.     Omega-3 Fatty Acids (FISH OIL PO) Take 1 tablet by mouth daily. (Patient not taking: Reported on 05/17/2024)     ondansetron  (ZOFRAN ) 4 MG tablet Take 1 tablet  (4 mg total) by mouth every 8 (eight) hours as needed for nausea or vomiting. 20 tablet 0   ondansetron  (ZOFRAN -ODT) 4 MG disintegrating tablet Take 1 tablet (4 mg total) by mouth every 8 (eight) hours as needed for nausea or vomiting. 30 tablet 1   pantoprazole  (PROTONIX ) 40 MG tablet Take 1 tablet (40 mg total) by mouth daily. 90 tablet 3   rosuvastatin  (CRESTOR ) 40 MG tablet Take 1 tablet (40 mg total) by mouth daily. 90 tablet 3   senna (SENOKOT) 8.6 MG TABS tablet Take 1 tablet by mouth at bedtime.      traMADol  (ULTRAM ) 50 MG tablet Take 1 tablet (50 mg total) by mouth daily as needed. 30 tablet 1   tretinoin  (RETIN-A ) 0.05 % cream Apply 1 application topically at bedtime. 45 g 0   valsartan  (DIOVAN ) 320 MG tablet Take 1 tablet (320 mg total) by mouth daily. 90 tablet 3   No current facility-administered medications on file prior to visit.   "

## 2024-06-20 NOTE — Patient Instructions (Addendum)
 Please call  and schedule your 3D mammogram and /or bone density scan as we discussed.   [facility]  ( new location in 2023)  [address], [place], [place] [address]  [place], [place]  [phone]   Referral for colonoscopy  Let us  know if you dont hear back within 2 weeks in regards to an appointment being scheduled.   So that you are aware, if you are [facility] MyChart user , please pay attention to your MyChart messages as you may receive a MyChart message with a phone number to call and schedule this test/appointment own your own from our referral coordinator. This is a new process so I do not want you to miss this message.  If you are not a MyChart user, you will receive a phone call.     Limit naps ( less than 30 minutes)  Nice to see you!

## 2024-06-20 NOTE — Progress Notes (Signed)
 "  @Patient  ID: Dana Obrien, female    DOB: Jul 02, 1945, 79 y.o.   MRN: [id number]  TEST/EVENTS :  split-night sleep study on 12/04/2020 that showed severe obstructive sleep apnea, AHI 44/hr with SPO2 low 83%.  He was recommended that she start BiPAP therapy with pressure setting of 22/19 centimeters H2O, heated humidity and ResMed F 30 I fullface mask size small.   CC Signs and symptoms of acute on chronic bronchitis Follow-up assessment of severe sleep apnea   HPI: 06/20/2024 Follow up OSA  Patient has underlying severe obstructive sleep apnea.   She had a split-night sleep study December 04, 2020 that showed severe sleep apnea with AHI of 44/hour with SPO2 low at 83%.    Discussed sleep data and reviewed with patient.  Encouraged proper weight management.  Discussed sleep hygiene Patient uses and benefits from therapy Using CPAP nightly and with naps Settings are comfortable and is sleeping well. BiPAP at 22/19 cm H2O.   BiPAP download reviewed in detail with patient AHI reduced to 0.2  CT of the chest showed calcified nodule subcentimeter Likely benign 2024 Follow up scan in next 6 months  Patient with recurrent bouts of operatory tract infection Has received several rounds of antibiotics and prednisone  Patient has residual cough This seems to have getting better but persistent cough and shortness of breath Plan to start DuoNebs and Advair  Allergies  Allergen Reactions   Lidocaine  Palpitations   Codeine Nausea Only and Nausea And Vomiting   Morphine  Nausea Only   Niacin Rash   Niacin And Related Rash    ANTIHYPERLIPEMICS    Immunization History  Administered Date(s) Administered   Fluad Quad(high Dose 65+) 02/23/2020   INFLUENZA, HIGH DOSE SEASONAL PF 08/11/2017, 05/30/2018   Influenza Whole 06/04/2008   Influenza,inj,Quad PF,6+ Mos 03/06/2014   Influenza-Unspecified 03/21/2012, 03/01/2013, 04/30/2015, 08/12/2015   PFIZER(Purple Top)SARS-COV-2 Vaccination  06/08/2019, 07/01/2019   Pneumococcal Conjugate-13 10/25/2013   Pneumococcal Polysaccharide-23 10/19/2012   Td 06/01/2005   Tdap 05/03/2014, 12/17/2022    Past Medical History:  Diagnosis Date   Anxiety    Arthritis    Asthma    Breast cancer (HCC) 2004   right breast   Breast cancer (HCC) 09/08/2017   left breast   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   Cancer [facility]) 2004   right lumpectomy, chemotherapy and radiation Dr. Sheryn   CPAP (continuous positive airway pressure) dependence    Depression    GERD (gastroesophageal reflux disease)    Hypertension    Migraine    Personal history of chemotherapy 2004   Personal history of radiation therapy 2004   Personal history of radiation therapy 2019   PONV (postoperative nausea and vomiting)    Shingles    Sleep apnea    uses BiPAP, severe OSA   Vitamin D  deficiency    IN THE PAST    Tobacco History: Social History   Tobacco Use  Smoking Status Never  Smokeless Tobacco Never   Counseling given: Not Answered   Outpatient Medications Prior to Visit  Medication Sig Dispense Refill   albuterol  (VENTOLIN  HFA) 108 (90 Base) MCG/ACT inhaler Inhale 2 puffs into the lungs every 6 (six) hours as needed for wheezing or shortness of breath. 8 g 2   ALPRAZolam  (XANAX ) 0.25 MG tablet Take 1 tablet (0.25 mg total) by mouth 2 (two) times daily as needed. for  anxiety 30 tablet 1   amLODipine  (NORVASC ) 5 MG tablet Take 1 tablet (5 mg total) by mouth daily. 90 tablet 3   busPIRone  (BUSPAR ) 5 MG tablet Take 1 tablet (5 mg total) by mouth 3 (three) times daily as needed. 60 tablet 1   cetirizine  (EQ ALLERGY RELIEF, CETIRIZINE ,) 10 MG tablet Take 1 tablet (10 mg total) by mouth daily. 90 tablet 3   FLUoxetine  (PROZAC ) 20 MG capsule Take 3 capsules (60 mg total) by mouth daily. 270 capsule 3   fluticasone  (FLONASE ) 50 MCG/ACT nasal spray Place 1  spray into both nostrils daily. 16 g 0   hydrochlorothiazide  (HYDRODIURIL ) 25 MG tablet TAKE 1 TABLET BY MOUTH ONCE DAILY . APPOINTMENT REQUIRED FOR FUTURE REFILLS 25 tablet 0   Multiple Vitamin (MULTI-VITAMIN) tablet Take 1 tablet by mouth daily.     Omega-3 Fatty Acids (FISH OIL PO) Take 1 tablet by mouth daily. (Patient not taking: Reported on 05/17/2024)     ondansetron  (ZOFRAN ) 4 MG tablet Take 1 tablet (4 mg total) by mouth every 8 (eight) hours as needed for nausea or vomiting. 20 tablet 0   ondansetron  (ZOFRAN -ODT) 4 MG disintegrating tablet Take 1 tablet (4 mg total) by mouth every 8 (eight) hours as needed for nausea or vomiting. 30 tablet 1   pantoprazole  (PROTONIX ) 40 MG tablet Take 1 tablet (40 mg total) by mouth daily. 90 tablet 3   rosuvastatin  (CRESTOR ) 40 MG tablet Take 1 tablet (40 mg total) by mouth daily. 90 tablet 3   senna (SENOKOT) 8.6 MG TABS tablet Take 1 tablet by mouth at bedtime.      traMADol  (ULTRAM ) 50 MG tablet Take 1 tablet (50 mg total) by mouth daily as needed. 30 tablet 1   tretinoin  (RETIN-A ) 0.05 % cream Apply 1 application topically at bedtime. 45 g 0   valsartan  (DIOVAN ) 320 MG tablet Take 1 tablet (320 mg total) by mouth daily. 90 tablet 3   amoxicillin -clavulanate (AUGMENTIN ) 875-125 MG tablet Take 1 tablet by mouth every 12 (twelve) hours. 14 tablet 0   diclofenac  Sodium (VOLTAREN ) 1 % GEL Apply 4 g topically 4 (four) times daily. Follow directions on bottle 20 g 3   EQ ARTHRITIS PAIN RELIEVER 1 % GEL APPLY 2 GRAMS TOPICALLY FOUR TIMES DAILY AS NEEDED 50 g 0   methocarbamol  (ROBAXIN ) 500 MG tablet Take 1 tablet (500 mg total) by mouth every 8 (eight) hours as needed for muscle spasms. 30 tablet 0   No facility-administered medications prior to visit.   BP 110/60   Pulse 73   Temp 98.5 F (36.9 C)   Ht 5' 4 (1.626 m)   Wt 213 lb (96.6 kg)   SpO2 96%   BMI 36.56 kg/m    Physical Examination:  General Appearance: No distress  EYES EOM intact.    NECK Supple, No JVD Pulmonary: normal breath sounds, No wheezing.  CardiovascularNormal S1,S2.  No m/r/g.   Ext pulses intact, cap refill intact  ALL OTHER ROS ARE NEGATIVE    Assessment & Plan:   79 year old pleasant white female seen today for follow-up assessment for severe OSA, current condition is acute on chronic bronchitis    OSA (obstructive sleep apnea) Encouraged on BIPAP compliance  Patient use and benefits from therapy Excellent compliance report  Assessment of OSA Previous AHI 44 Continue CPAP as prescribed  Excellent compliance report Reviewed compliance report in detail with patient Patient definitely benefits the use of CPAP therapy as prescribed Using CPAP  nightly and with naps Pressure setting is comfortable and is sleeping well. BiPAP 22/19 AHI reduced to 0.2  No evidence of acute heart failure at this time No respiratory distress No fevers, chills, nausea, vomiting, diarrhea No evidence hemoptysis  Acute on chronic bronchitis Over-the-counter medication as needed DuoNebs and Advair HFA prescribed Follow-up in 2 months for further assessment  4 mm 2 mm subcentimeter nodules Noted in CAT scan July 2024 Recommend repeat CT chest in the next 6 months   MEDICATION ADJUSTMENTS/LABS AND TESTS ORDERED: Advair DuoNebs Continue BiPAP for OSA Follow-up CT scan in 6 months Avoid Allergens and Irritants Avoid secondhand smoke Avoid SICK contacts Recommend  Masking  when appropriate Recommend Keep up-to-date with vaccinations   CURRENT MEDICATIONS REVIEWED AT LENGTH WITH PATIENT TODAY   Patient  satisfied with Plan of action and management. All questions answered   Follow up 2 months   I spent a total of 48 minutes dedicated to the care of this patient on the date of this encounter to include pre-visit review of records, face-to-face time with the patient discussing conditions above, post visit ordering of testing, clinical documentation with  the electronic health record, making appropriate referrals as documented, and communicating necessary information to the patient's healthcare team.    The Patient requires high complexity decision making for assessment and support, frequent evaluation and titration of therapies, application of advanced monitoring technologies and extensive interpretation of multiple databases.  Patient satisfied with Plan of action and management. All questions answered    Nickolas Alm Cellar, M.D.  [facility]  Medical Director [facility] [place]         "

## 2024-06-20 NOTE — Patient Instructions (Addendum)
 For acute bronchitis no more antibiotics or prednisone  will start DuoNebs nebulizers every 4-6 hours as needed Start Advair 2 puffs in the morning and 2 puffs at night please rinse mouth after use  Assess for CT chest in the next 6 months  Excellent Job A+ GOLD STAR!!  Continue CPAP as prescribed  Patient Instructions Continue to use CPAP every night, minimum of 4-6 hours a night.  Change equipment every 30 days or as directed by DME.  Wash your tubing with warm soap and water daily, hang to dry. Wash humidifier portion weekly. Use bottled, distilled water and change daily   Be aware of reduced alertness and do not drive or operate heavy machinery if experiencing this or drowsiness.  Exercise encouraged, as tolerated. Encouraged proper weight management.  Important to get eight or more hours of sleep  Limiting the use of the computer and television before bedtime.  Decrease naps during the day, so night time sleep will become enhanced.  Limit caffeine, and sleep deprivation.    Avoid Allergens and Irritants Avoid secondhand smoke Avoid SICK contacts Recommend  Masking  when appropriate Recommend Keep up-to-date with vaccinations

## 2024-06-22 NOTE — Addendum Note (Signed)
 Addended byBETHA ISAIAH LENIS on: 06/22/2024 12:36 PM   Modules accepted: Level of Service

## 2024-06-23 NOTE — Assessment & Plan Note (Addendum)
 Chronic, poorly controlled.  Cervical MRI was reviewed.  She is established with orthopedics, Dr. Georgina.  I encouraged her to discuss with potential ESI. Encourage tramadol  50mg  every day prn for pain management, adjusting timing to avoid sedation. Consider physical therapy for neck pain, overall deconditioning

## 2024-06-23 NOTE — Assessment & Plan Note (Addendum)
 Chronic, suboptimal control with sleep disturbances. Long conversation as a relates to sleep hygiene, lack of physical activity. advised not to nap longer than 30 minutes each day.  Chronic pain limits exercise.  Continue Prozac  60mg  every day, xanax  0.25 mg twice daily as needed, buspar  5 mg 3 times daily as needed

## 2024-06-23 NOTE — Telephone Encounter (Signed)
Please contact pt for future appointment. Pt overdue for follow up.

## 2024-06-23 NOTE — Telephone Encounter (Signed)
 Left voice mail

## 2024-07-24 ENCOUNTER — Other Ambulatory Visit

## 2024-10-18 ENCOUNTER — Ambulatory Visit: Admitting: Internal Medicine

## 2025-05-22 ENCOUNTER — Ambulatory Visit
